# Patient Record
Sex: Male | Born: 1955 | Race: White | Hispanic: No | Marital: Married | Smoking: Former smoker
Health system: Southern US, Community
[De-identification: ages and names within clinical notes are randomized; demographics above are authoritative.]

## PROBLEM LIST (undated history)

## (undated) DIAGNOSIS — G4730 Sleep apnea, unspecified: Secondary | ICD-10-CM

## (undated) DIAGNOSIS — K5792 Diverticulitis of intestine, part unspecified, without perforation or abscess without bleeding: Secondary | ICD-10-CM

## (undated) DIAGNOSIS — E119 Type 2 diabetes mellitus without complications: Secondary | ICD-10-CM

## (undated) DIAGNOSIS — T7840XA Allergy, unspecified, initial encounter: Secondary | ICD-10-CM

## (undated) DIAGNOSIS — J45909 Unspecified asthma, uncomplicated: Secondary | ICD-10-CM

## (undated) DIAGNOSIS — I10 Essential (primary) hypertension: Secondary | ICD-10-CM

## (undated) DIAGNOSIS — D126 Benign neoplasm of colon, unspecified: Secondary | ICD-10-CM

## (undated) DIAGNOSIS — K219 Gastro-esophageal reflux disease without esophagitis: Secondary | ICD-10-CM

## (undated) DIAGNOSIS — F419 Anxiety disorder, unspecified: Secondary | ICD-10-CM

## (undated) DIAGNOSIS — E785 Hyperlipidemia, unspecified: Secondary | ICD-10-CM

## (undated) DIAGNOSIS — I252 Old myocardial infarction: Secondary | ICD-10-CM

## (undated) DIAGNOSIS — I509 Heart failure, unspecified: Secondary | ICD-10-CM

## (undated) DIAGNOSIS — R0600 Dyspnea, unspecified: Secondary | ICD-10-CM

## (undated) HISTORY — DX: Sleep apnea, unspecified: G47.30

## (undated) HISTORY — DX: Gastro-esophageal reflux disease without esophagitis: K21.9

## (undated) HISTORY — DX: Unspecified asthma, uncomplicated: J45.909

## (undated) HISTORY — DX: Old myocardial infarction: I25.2

## (undated) HISTORY — DX: Type 2 diabetes mellitus without complications: E11.9

## (undated) HISTORY — PX: POLYPECTOMY: SHX149

## (undated) HISTORY — DX: Essential (primary) hypertension: I10

## (undated) HISTORY — DX: Allergy, unspecified, initial encounter: T78.40XA

## (undated) HISTORY — DX: Hyperlipidemia, unspecified: E78.5

## (undated) HISTORY — PX: TONSILLECTOMY: SUR1361

## (undated) HISTORY — PX: CORONARY ANGIOPLASTY: SHX604

## (undated) HISTORY — DX: Anxiety disorder, unspecified: F41.9

## (undated) HISTORY — DX: Benign neoplasm of colon, unspecified: D12.6

## (undated) HISTORY — DX: Diverticulitis of intestine, part unspecified, without perforation or abscess without bleeding: K57.92

## (undated) HISTORY — PX: COLONOSCOPY: SHX174

---

## 1998-08-09 DIAGNOSIS — I219 Acute myocardial infarction, unspecified: Secondary | ICD-10-CM

## 1998-08-09 HISTORY — DX: Acute myocardial infarction, unspecified: I21.9

## 1999-04-26 ENCOUNTER — Inpatient Hospital Stay (HOSPITAL_COMMUNITY): Admission: EM | Admit: 1999-04-26 | Discharge: 1999-04-29 | Payer: Self-pay | Admitting: Cardiology

## 1999-04-26 ENCOUNTER — Encounter: Payer: Self-pay | Admitting: Emergency Medicine

## 1999-06-09 ENCOUNTER — Encounter (HOSPITAL_COMMUNITY): Admission: RE | Admit: 1999-06-09 | Discharge: 1999-09-07 | Payer: Self-pay | Admitting: Cardiology

## 1999-06-10 HISTORY — PX: CORONARY ARTERY BYPASS GRAFT: SHX141

## 1999-06-17 ENCOUNTER — Encounter: Payer: Self-pay | Admitting: Thoracic Surgery (Cardiothoracic Vascular Surgery)

## 1999-06-18 ENCOUNTER — Inpatient Hospital Stay (HOSPITAL_COMMUNITY): Admission: AD | Admit: 1999-06-18 | Discharge: 1999-06-23 | Payer: Self-pay | Admitting: Cardiology

## 1999-06-19 ENCOUNTER — Encounter: Payer: Self-pay | Admitting: Surgery

## 1999-06-20 ENCOUNTER — Encounter: Payer: Self-pay | Admitting: Cardiothoracic Surgery

## 1999-06-21 ENCOUNTER — Encounter: Payer: Self-pay | Admitting: Surgery

## 1999-09-08 ENCOUNTER — Encounter (HOSPITAL_COMMUNITY): Admission: RE | Admit: 1999-09-08 | Discharge: 1999-12-07 | Payer: Self-pay | Admitting: Cardiology

## 2003-06-27 ENCOUNTER — Encounter: Payer: Self-pay | Admitting: Gastroenterology

## 2003-06-27 ENCOUNTER — Encounter: Payer: Self-pay | Admitting: Internal Medicine

## 2004-06-25 ENCOUNTER — Ambulatory Visit: Payer: Self-pay | Admitting: Internal Medicine

## 2004-08-14 ENCOUNTER — Ambulatory Visit: Payer: Self-pay | Admitting: Internal Medicine

## 2004-08-21 ENCOUNTER — Ambulatory Visit: Payer: Self-pay | Admitting: Internal Medicine

## 2004-10-19 ENCOUNTER — Ambulatory Visit: Payer: Self-pay | Admitting: Internal Medicine

## 2004-10-26 ENCOUNTER — Ambulatory Visit: Payer: Self-pay | Admitting: Internal Medicine

## 2004-12-21 ENCOUNTER — Ambulatory Visit: Payer: Self-pay | Admitting: Internal Medicine

## 2005-01-07 ENCOUNTER — Ambulatory Visit: Payer: Self-pay | Admitting: Internal Medicine

## 2005-04-09 ENCOUNTER — Ambulatory Visit: Payer: Self-pay | Admitting: Internal Medicine

## 2005-04-19 ENCOUNTER — Ambulatory Visit: Payer: Self-pay | Admitting: Internal Medicine

## 2005-07-12 ENCOUNTER — Ambulatory Visit: Payer: Self-pay | Admitting: Internal Medicine

## 2005-07-20 ENCOUNTER — Ambulatory Visit: Payer: Self-pay | Admitting: Internal Medicine

## 2005-09-13 ENCOUNTER — Ambulatory Visit: Payer: Self-pay | Admitting: Internal Medicine

## 2005-09-20 ENCOUNTER — Ambulatory Visit: Payer: Self-pay | Admitting: Internal Medicine

## 2005-12-15 ENCOUNTER — Ambulatory Visit: Payer: Self-pay | Admitting: Internal Medicine

## 2005-12-20 ENCOUNTER — Ambulatory Visit: Payer: Self-pay | Admitting: Internal Medicine

## 2006-03-04 ENCOUNTER — Ambulatory Visit: Payer: Self-pay | Admitting: Internal Medicine

## 2006-05-01 ENCOUNTER — Emergency Department (HOSPITAL_COMMUNITY): Admission: EM | Admit: 2006-05-01 | Discharge: 2006-05-01 | Payer: Self-pay | Admitting: Emergency Medicine

## 2006-05-10 ENCOUNTER — Ambulatory Visit: Payer: Self-pay | Admitting: Internal Medicine

## 2006-05-13 ENCOUNTER — Ambulatory Visit: Payer: Self-pay | Admitting: Internal Medicine

## 2006-05-17 ENCOUNTER — Ambulatory Visit: Payer: Self-pay | Admitting: Internal Medicine

## 2006-07-29 ENCOUNTER — Ambulatory Visit: Payer: Self-pay | Admitting: Internal Medicine

## 2006-08-24 ENCOUNTER — Ambulatory Visit: Payer: Self-pay | Admitting: Internal Medicine

## 2006-08-24 LAB — CONVERTED CEMR LAB
ALT: 38 units/L (ref 0–40)
AST: 25 units/L (ref 0–37)
Albumin: 3.9 g/dL (ref 3.5–5.2)
Alkaline Phosphatase: 79 units/L (ref 39–117)
BUN: 11 mg/dL (ref 6–23)
Basophils Absolute: 0 10*3/uL (ref 0.0–0.1)
Basophils Relative: 0 % (ref 0.0–1.0)
CO2: 30 meq/L (ref 19–32)
Calcium: 9.6 mg/dL (ref 8.4–10.5)
Chloride: 105 meq/L (ref 96–112)
Cholesterol: 92 mg/dL (ref 0–200)
Creatinine, Ser: 1.2 mg/dL (ref 0.4–1.5)
Eosinophils Relative: 4.6 % (ref 0.0–5.0)
GFR calc Af Amer: 82 mL/min
GFR calc non Af Amer: 68 mL/min
Glucose, Bld: 126 mg/dL — ABNORMAL HIGH (ref 70–99)
HCT: 44.8 % (ref 39.0–52.0)
HDL: 28.6 mg/dL — ABNORMAL LOW (ref >39.0)
Hemoglobin: 15.1 g/dL (ref 13.0–17.0)
LDL Cholesterol: 37 mg/dL (ref 0–99)
Lymphocytes Relative: 15.6 % (ref 12.0–46.0)
MCHC: 33.7 g/dL (ref 30.0–36.0)
MCV: 90.2 fL (ref 78.0–100.0)
Monocytes Absolute: 0.7 10*3/uL (ref 0.2–0.7)
Monocytes Relative: 7.4 % (ref 3.0–11.0)
Neutro Abs: 6.4 10*3/uL (ref 1.4–7.7)
Neutrophils Relative %: 72.4 % (ref 43.0–77.0)
PSA: 1.42 ng/mL (ref 0.10–4.00)
Platelets: 308 10*3/uL (ref 150–400)
Potassium: 3.8 meq/L (ref 3.5–5.1)
RBC: 4.96 M/uL (ref 4.22–5.81)
RDW: 12.6 % (ref 11.5–14.6)
Sodium: 141 meq/L (ref 135–145)
TSH: 0.29 microintl units/mL — ABNORMAL LOW (ref 0.35–5.50)
Total Bilirubin: 1.4 mg/dL — ABNORMAL HIGH (ref 0.3–1.2)
Total CHOL/HDL Ratio: 3.2
Total Protein: 7.4 g/dL (ref 6.0–8.3)
Triglycerides: 130 mg/dL (ref 0–149)
VLDL: 26 mg/dL (ref 0–40)
WBC: 8.9 10*3/uL (ref 4.5–10.5)

## 2006-08-25 ENCOUNTER — Encounter: Payer: Self-pay | Admitting: Internal Medicine

## 2006-09-01 ENCOUNTER — Ambulatory Visit: Payer: Self-pay | Admitting: Internal Medicine

## 2006-11-25 ENCOUNTER — Ambulatory Visit: Payer: Self-pay | Admitting: Internal Medicine

## 2006-11-25 LAB — CONVERTED CEMR LAB
Albumin: 3.8 g/dL (ref 3.5–5.2)
Alkaline Phosphatase: 78 units/L (ref 39–117)
CRP, High Sensitivity: 4 (ref 0.00–5.00)
Cholesterol: 100 mg/dL (ref 0–200)
LDL Cholesterol: 42 mg/dL (ref 0–99)
Total CHOL/HDL Ratio: 4.1
Total Protein: 7 g/dL (ref 6.0–8.3)
Triglycerides: 168 mg/dL — ABNORMAL HIGH (ref 0–149)

## 2006-12-01 ENCOUNTER — Ambulatory Visit: Payer: Self-pay | Admitting: Internal Medicine

## 2007-01-25 ENCOUNTER — Ambulatory Visit: Payer: Self-pay | Admitting: Internal Medicine

## 2007-01-25 LAB — CONVERTED CEMR LAB
ALT: 22 units/L (ref 0–40)
Bilirubin, Direct: 0.2 mg/dL (ref 0.0–0.3)
Cholesterol: 99 mg/dL (ref 0–200)
HDL: 22.5 mg/dL — ABNORMAL LOW (ref >39.0)
LDL Cholesterol: 52 mg/dL (ref 0–99)
Total Bilirubin: 0.8 mg/dL (ref 0.3–1.2)
Total Protein: 6.6 g/dL (ref 6.0–8.3)

## 2007-02-03 ENCOUNTER — Ambulatory Visit: Payer: Self-pay | Admitting: Internal Medicine

## 2007-02-13 ENCOUNTER — Telehealth: Payer: Self-pay | Admitting: Internal Medicine

## 2007-04-06 DIAGNOSIS — I10 Essential (primary) hypertension: Secondary | ICD-10-CM | POA: Insufficient documentation

## 2007-04-06 DIAGNOSIS — I2510 Atherosclerotic heart disease of native coronary artery without angina pectoris: Secondary | ICD-10-CM | POA: Insufficient documentation

## 2007-04-06 DIAGNOSIS — F411 Generalized anxiety disorder: Secondary | ICD-10-CM | POA: Insufficient documentation

## 2007-04-06 DIAGNOSIS — E785 Hyperlipidemia, unspecified: Secondary | ICD-10-CM | POA: Insufficient documentation

## 2007-05-10 ENCOUNTER — Ambulatory Visit: Payer: Self-pay | Admitting: Internal Medicine

## 2007-05-10 LAB — CONVERTED CEMR LAB
ALT: 27 units/L (ref 0–53)
AST: 21 units/L (ref 0–37)
Albumin: 3.7 g/dL (ref 3.5–5.2)
Alkaline Phosphatase: 75 units/L (ref 39–117)
Total Bilirubin: 1.1 mg/dL (ref 0.3–1.2)
Triglycerides: 153 mg/dL — ABNORMAL HIGH (ref 0–149)
VLDL: 31 mg/dL (ref 0–40)

## 2007-05-17 ENCOUNTER — Ambulatory Visit: Payer: Self-pay | Admitting: Internal Medicine

## 2007-05-17 DIAGNOSIS — J309 Allergic rhinitis, unspecified: Secondary | ICD-10-CM | POA: Insufficient documentation

## 2007-05-17 DIAGNOSIS — N529 Male erectile dysfunction, unspecified: Secondary | ICD-10-CM | POA: Insufficient documentation

## 2007-05-17 LAB — CONVERTED CEMR LAB: Cholesterol, target level: 200 mg/dL

## 2007-06-05 ENCOUNTER — Telehealth (INDEPENDENT_AMBULATORY_CARE_PROVIDER_SITE_OTHER): Payer: Self-pay | Admitting: *Deleted

## 2007-06-05 DIAGNOSIS — M25579 Pain in unspecified ankle and joints of unspecified foot: Secondary | ICD-10-CM | POA: Insufficient documentation

## 2007-06-07 ENCOUNTER — Telehealth (INDEPENDENT_AMBULATORY_CARE_PROVIDER_SITE_OTHER): Payer: Self-pay | Admitting: *Deleted

## 2007-08-23 ENCOUNTER — Ambulatory Visit: Payer: Self-pay | Admitting: Internal Medicine

## 2007-08-31 ENCOUNTER — Ambulatory Visit: Payer: Self-pay | Admitting: Internal Medicine

## 2007-08-31 LAB — CONVERTED CEMR LAB
AST: 24 units/L (ref 0–37)
Albumin: 4.1 g/dL (ref 3.5–5.2)
Alkaline Phosphatase: 71 units/L (ref 39–117)
CO2: 24 meq/L (ref 19–32)
Chloride: 101 meq/L (ref 96–112)
Cholesterol: 113 mg/dL (ref 0–200)
Creatinine, Ser: 1 mg/dL (ref 0.4–1.5)
HCT: 46 % (ref 39.0–52.0)
HDL: 18.9 mg/dL — ABNORMAL LOW (ref >39.0)
LDL Cholesterol: 67 mg/dL (ref 0–99)
MCHC: 34.3 g/dL (ref 30.0–36.0)
MCV: 91.9 fL (ref 78.0–100.0)
Monocytes Relative: 5.1 % (ref 3.0–11.0)
Neutrophils Relative %: 76.6 % (ref 43.0–77.0)
Potassium: 4.2 meq/L (ref 3.5–5.1)
RBC: 5 M/uL (ref 4.22–5.81)
RDW: 12.7 % (ref 11.5–14.6)
Sodium: 138 meq/L (ref 135–145)
Total Bilirubin: 0.9 mg/dL (ref 0.3–1.2)
Total CHOL/HDL Ratio: 6
Total Protein: 7.2 g/dL (ref 6.0–8.3)
Triglycerides: 134 mg/dL (ref 0–149)
Urobilinogen, UA: 0.2
pH: 5.5

## 2007-09-07 ENCOUNTER — Ambulatory Visit: Payer: Self-pay | Admitting: Internal Medicine

## 2007-11-28 ENCOUNTER — Ambulatory Visit: Payer: Self-pay | Admitting: Internal Medicine

## 2008-01-25 ENCOUNTER — Ambulatory Visit: Payer: Self-pay | Admitting: Internal Medicine

## 2008-02-20 ENCOUNTER — Ambulatory Visit: Payer: Self-pay | Admitting: Internal Medicine

## 2008-02-20 DIAGNOSIS — T887XXA Unspecified adverse effect of drug or medicament, initial encounter: Secondary | ICD-10-CM | POA: Insufficient documentation

## 2008-02-20 LAB — CONVERTED CEMR LAB
Bilirubin, Direct: 0.1 mg/dL (ref 0.0–0.3)
HDL: 23.3 mg/dL — ABNORMAL LOW (ref >39.0)
Total Bilirubin: 0.9 mg/dL (ref 0.3–1.2)
Total CHOL/HDL Ratio: 4.9
VLDL: 31 mg/dL (ref 0–40)

## 2008-03-07 ENCOUNTER — Ambulatory Visit: Payer: Self-pay | Admitting: Internal Medicine

## 2008-03-18 ENCOUNTER — Ambulatory Visit: Payer: Self-pay | Admitting: Internal Medicine

## 2008-05-10 ENCOUNTER — Ambulatory Visit: Payer: Self-pay | Admitting: Internal Medicine

## 2008-06-12 ENCOUNTER — Telehealth: Payer: Self-pay | Admitting: *Deleted

## 2008-06-18 ENCOUNTER — Telehealth: Payer: Self-pay | Admitting: Internal Medicine

## 2008-06-19 ENCOUNTER — Ambulatory Visit: Payer: Self-pay | Admitting: Internal Medicine

## 2008-06-19 DIAGNOSIS — J4540 Moderate persistent asthma, uncomplicated: Secondary | ICD-10-CM | POA: Insufficient documentation

## 2008-08-27 ENCOUNTER — Ambulatory Visit: Payer: Self-pay | Admitting: Gastroenterology

## 2008-08-30 ENCOUNTER — Ambulatory Visit: Payer: Self-pay | Admitting: Internal Medicine

## 2008-08-30 LAB — CONVERTED CEMR LAB
AST: 21 units/L (ref 0–37)
Basophils Absolute: 0 10*3/uL (ref 0.0–0.1)
Basophils Relative: 0.1 % (ref 0.0–3.0)
Bilirubin Urine: NEGATIVE
Bilirubin, Direct: 0.1 mg/dL (ref 0.0–0.3)
Blood in Urine, dipstick: NEGATIVE
CRP, High Sensitivity: 4 (ref 0.00–5.00)
Chloride: 104 meq/L (ref 96–112)
Cholesterol: 109 mg/dL (ref 0–200)
Creatinine, Ser: 1 mg/dL (ref 0.4–1.5)
Eosinophils Absolute: 0.3 10*3/uL (ref 0.0–0.7)
GFR calc non Af Amer: 83 mL/min
Glucose, Urine, Semiquant: NEGATIVE
HDL: 28.9 mg/dL — ABNORMAL LOW (ref >39.0)
LDL Cholesterol: 61 mg/dL (ref 0–99)
MCHC: 35.2 g/dL (ref 30.0–36.0)
MCV: 91.7 fL (ref 78.0–100.0)
Neutrophils Relative %: 71.5 % (ref 43.0–77.0)
PSA: 1.07 ng/mL (ref 0.10–4.00)
Platelets: 221 10*3/uL (ref 150–400)
Protein, U semiquant: NEGATIVE
RBC: 4.54 M/uL (ref 4.22–5.81)
RDW: 12.9 % (ref 11.5–14.6)
Sodium: 141 meq/L (ref 135–145)
Total Bilirubin: 1.1 mg/dL (ref 0.3–1.2)
Triglycerides: 96 mg/dL (ref 0–149)
Urobilinogen, UA: 0.2
VLDL: 19 mg/dL (ref 0–40)
pH: 5.5

## 2008-09-09 DIAGNOSIS — D126 Benign neoplasm of colon, unspecified: Secondary | ICD-10-CM

## 2008-09-09 HISTORY — DX: Benign neoplasm of colon, unspecified: D12.6

## 2008-09-12 ENCOUNTER — Ambulatory Visit: Payer: Self-pay | Admitting: Internal Medicine

## 2008-09-13 ENCOUNTER — Ambulatory Visit: Payer: Self-pay | Admitting: Gastroenterology

## 2008-09-13 ENCOUNTER — Encounter: Payer: Self-pay | Admitting: Gastroenterology

## 2008-09-16 ENCOUNTER — Encounter: Payer: Self-pay | Admitting: Gastroenterology

## 2008-11-26 ENCOUNTER — Ambulatory Visit: Payer: Self-pay | Admitting: Internal Medicine

## 2008-11-27 ENCOUNTER — Telehealth: Payer: Self-pay | Admitting: Internal Medicine

## 2008-11-29 ENCOUNTER — Telehealth: Payer: Self-pay | Admitting: Internal Medicine

## 2008-12-04 ENCOUNTER — Ambulatory Visit: Payer: Self-pay | Admitting: Internal Medicine

## 2008-12-04 LAB — CONVERTED CEMR LAB
AST: 24 units/L (ref 0–37)
Albumin: 3.7 g/dL (ref 3.5–5.2)
Alkaline Phosphatase: 68 units/L (ref 39–117)
Cholesterol: 115 mg/dL (ref 0–200)
Total Protein: 7.3 g/dL (ref 6.0–8.3)
Triglycerides: 120 mg/dL (ref 0.0–149.0)

## 2008-12-11 ENCOUNTER — Ambulatory Visit: Payer: Self-pay | Admitting: Internal Medicine

## 2009-04-21 ENCOUNTER — Ambulatory Visit: Payer: Self-pay | Admitting: Internal Medicine

## 2009-04-21 LAB — CONVERTED CEMR LAB
Bilirubin, Direct: 0.1 mg/dL (ref 0.0–0.3)
CO2: 28 meq/L (ref 19–32)
Calcium: 9.1 mg/dL (ref 8.4–10.5)
Creatinine, Ser: 1 mg/dL (ref 0.4–1.5)
HDL: 28.8 mg/dL — ABNORMAL LOW (ref >39.00)
Total Bilirubin: 0.8 mg/dL (ref 0.3–1.2)
Total CHOL/HDL Ratio: 4
Total Protein: 6.9 g/dL (ref 6.0–8.3)
Triglycerides: 129 mg/dL (ref 0.0–149.0)

## 2009-04-28 ENCOUNTER — Ambulatory Visit: Payer: Self-pay | Admitting: Internal Medicine

## 2009-05-08 ENCOUNTER — Telehealth: Payer: Self-pay | Admitting: Internal Medicine

## 2009-05-08 DIAGNOSIS — K5732 Diverticulitis of large intestine without perforation or abscess without bleeding: Secondary | ICD-10-CM | POA: Insufficient documentation

## 2009-05-09 ENCOUNTER — Ambulatory Visit: Payer: Self-pay | Admitting: Internal Medicine

## 2009-06-06 ENCOUNTER — Ambulatory Visit: Payer: Self-pay | Admitting: Internal Medicine

## 2009-09-08 ENCOUNTER — Ambulatory Visit: Payer: Self-pay | Admitting: Internal Medicine

## 2009-09-08 LAB — CONVERTED CEMR LAB
AST: 29 units/L (ref 0–37)
Albumin: 4 g/dL (ref 3.5–5.2)
Alkaline Phosphatase: 57 units/L (ref 39–117)
Basophils Relative: 1 % (ref 0.0–3.0)
Bilirubin, Direct: 0.2 mg/dL (ref 0.0–0.3)
Blood in Urine, dipstick: NEGATIVE
CO2: 30 meq/L (ref 19–32)
Calcium: 9.6 mg/dL (ref 8.4–10.5)
GFR calc non Af Amer: 74.08 mL/min (ref >60)
HDL: 31.9 mg/dL — ABNORMAL LOW (ref >39.00)
Hemoglobin: 15.7 g/dL (ref 13.0–17.0)
LDL Cholesterol: 52 mg/dL (ref 0–99)
Lymphocytes Relative: 19.6 % (ref 12.0–46.0)
Monocytes Relative: 8.6 % (ref 3.0–12.0)
Neutro Abs: 4.5 10*3/uL (ref 1.4–7.7)
Neutrophils Relative %: 67.6 % (ref 43.0–77.0)
Nitrite: NEGATIVE
RBC: 5.06 M/uL (ref 4.22–5.81)
Sodium: 142 meq/L (ref 135–145)
Specific Gravity, Urine: 1.02
Total CHOL/HDL Ratio: 3
Total Protein: 7.4 g/dL (ref 6.0–8.3)
Urobilinogen, UA: 0.2
VLDL: 14.8 mg/dL (ref 0.0–40.0)
WBC Urine, dipstick: NEGATIVE
WBC: 6.7 10*3/uL (ref 4.5–10.5)

## 2009-09-15 ENCOUNTER — Ambulatory Visit: Payer: Self-pay | Admitting: Internal Medicine

## 2009-09-15 DIAGNOSIS — R972 Elevated prostate specific antigen [PSA]: Secondary | ICD-10-CM | POA: Insufficient documentation

## 2009-11-13 ENCOUNTER — Ambulatory Visit: Payer: Self-pay | Admitting: Internal Medicine

## 2009-11-14 ENCOUNTER — Telehealth: Payer: Self-pay | Admitting: Internal Medicine

## 2009-11-17 ENCOUNTER — Ambulatory Visit: Payer: Self-pay | Admitting: Internal Medicine

## 2010-01-20 ENCOUNTER — Ambulatory Visit: Payer: Self-pay | Admitting: Internal Medicine

## 2010-01-20 LAB — CONVERTED CEMR LAB
AST: 21 units/L (ref 0–37)
Albumin: 3.9 g/dL (ref 3.5–5.2)
BUN: 12 mg/dL (ref 6–23)
Calcium: 9.6 mg/dL (ref 8.4–10.5)
Cholesterol: 126 mg/dL (ref 0–200)
GFR calc non Af Amer: 79.81 mL/min (ref >60)
Glucose, Bld: 161 mg/dL — ABNORMAL HIGH (ref 70–99)
HDL: 32 mg/dL — ABNORMAL LOW (ref >39.00)
LDL Cholesterol: 71 mg/dL (ref 0–99)
PSA, Free Pct: 40 (ref >25)
PSA: 1.26 ng/mL (ref 0.10–4.00)
Potassium: 4.1 meq/L (ref 3.5–5.1)
Sodium: 143 meq/L (ref 135–145)
Total Bilirubin: 0.8 mg/dL (ref 0.3–1.2)
VLDL: 23.2 mg/dL (ref 0.0–40.0)

## 2010-01-22 ENCOUNTER — Telehealth: Payer: Self-pay | Admitting: Internal Medicine

## 2010-01-27 ENCOUNTER — Ambulatory Visit: Payer: Self-pay | Admitting: Internal Medicine

## 2010-03-24 ENCOUNTER — Ambulatory Visit: Payer: Self-pay | Admitting: Internal Medicine

## 2010-03-24 LAB — CONVERTED CEMR LAB: Hgb A1c MFr Bld: 8 % — ABNORMAL HIGH (ref 4.6–6.5)

## 2010-03-26 ENCOUNTER — Telehealth: Payer: Self-pay | Admitting: Internal Medicine

## 2010-03-31 ENCOUNTER — Ambulatory Visit: Payer: Self-pay | Admitting: Internal Medicine

## 2010-03-31 DIAGNOSIS — E119 Type 2 diabetes mellitus without complications: Secondary | ICD-10-CM

## 2010-03-31 DIAGNOSIS — E1169 Type 2 diabetes mellitus with other specified complication: Secondary | ICD-10-CM | POA: Insufficient documentation

## 2010-04-08 ENCOUNTER — Telehealth: Payer: Self-pay | Admitting: Internal Medicine

## 2010-06-03 ENCOUNTER — Ambulatory Visit: Payer: Self-pay | Admitting: Internal Medicine

## 2010-06-03 LAB — CONVERTED CEMR LAB
ALT: 26 units/L (ref 0–53)
AST: 20 units/L (ref 0–37)
Alkaline Phosphatase: 61 units/L (ref 39–117)
Bilirubin, Direct: 0.2 mg/dL (ref 0.0–0.3)
Cholesterol: 109 mg/dL (ref 0–200)
Total Bilirubin: 1 mg/dL (ref 0.3–1.2)
Total Protein: 6.9 g/dL (ref 6.0–8.3)
VLDL: 20.4 mg/dL (ref 0.0–40.0)

## 2010-06-10 ENCOUNTER — Ambulatory Visit: Payer: Self-pay | Admitting: Internal Medicine

## 2010-06-10 LAB — CONVERTED CEMR LAB: Cholesterol, target level: 200 mg/dL

## 2010-09-08 ENCOUNTER — Ambulatory Visit
Admission: RE | Admit: 2010-09-08 | Discharge: 2010-09-08 | Payer: Self-pay | Source: Home / Self Care | Attending: Internal Medicine | Admitting: Internal Medicine

## 2010-09-08 NOTE — Assessment & Plan Note (Signed)
Summary: CPX//SLM   Vital Signs:  Patient profile:   55 year old male Height:      72 inches Weight:      227 pounds BMI:     30.90 Temp:     98.2 degrees F oral Pulse rate:   70 / minute Resp:     14 per minute BP sitting:   136 / 84  (left arm)  Vitals Entered By: Willy Eddy, LPN (September 15, 2009 8:54 AM)  Nutrition Counseling: Patient's BMI is greater than 25 and therefore counseled on weight management options. CC: cpx   CC:  cpx.  History of Present Illness: The pt was asked about all immunizations, health maint. services that are appropriate to their age and was given guidance on diet exercize  and weight management   bump in PAS notedon CPX labs  Preventive Screening-Counseling & Management  Alcohol-Tobacco     Smoking Status: never     Year Quit:  ~1980  Problems Prior to Update: 1)  Diverticulitis, Acute  (ICD-562.11) 2)  Dysmetabolic Syndrome X  (ICD-277.7) 3)  Asthma Unspecified With Exacerbation  (ICD-493.92) 4)  Cough  (ICD-786.2) 5)  Uns Advrs Eff Uns Rx Medicinal&biological Sbstnc  (ICD-995.20) 6)  Acute Maxillary Sinusitis  (ICD-461.0) 7)  Physical Examination  (ICD-V70.0) 8)  Ankle Pain, Chronic  (ICD-719.47) 9)  Allergic Rhinitis  (ICD-477.9) 10)  Erectile Dysfunction, 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) 11)  Myocardial Infarction, Hx of  (ICD-412) 12)  Hypertension  (ICD-401.9) 13)  Hyperlipidemia  (ICD-272.4) 14)  Anxiety  (ICD-300.00)  Current Problems (verified): 1)  Diverticulitis, Acute  (ICD-562.11) 2)  Dysmetabolic Syndrome X  (ICD-277.7) 3)  Asthma Unspecified With Exacerbation  (ICD-493.92) 4)  Cough  (ICD-786.2) 5)  Uns Advrs Eff Uns Rx Medicinal&biological Sbstnc  (ICD-995.20) 6)  Acute Maxillary Sinusitis  (ICD-461.0) 7)  Physical Examination  (ICD-V70.0) 8)  Ankle Pain, Chronic  (ICD-719.47) 9)  Allergic Rhinitis  (ICD-477.9) 10)  Erectile Dysfunction, 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 Once Daily 2)  Neurpath-B 3-35-2 Mg Tabs (L-Methylfolate-B6-B12) .Marland Kitchen.. 1 Once Daily 3)  Aspirin 325 Mg  Tbec (Aspirin) .... Once Daily 4)  Multivitamins   Caps (Multiple Vitamin) .... Once Daily 5)  Flax Seed Oil 1000 Mg  Caps (Flaxseed (Linseed)) .... Once Daily 6)  Losartan Potassium 100 Mg Tabs (Losartan Potassium) .Marland Kitchen.. 1 Once Daily 7)  Elidel 1 %  Crea (Pimecrolimus) .... As Needed 8)  Niacin Flush Free 500 Mg  Caps (Inositol Niacinate) .... Once Daily 9)  Vitamin B-6 500 Mg  Tabs (Pyridoxine Hcl) .... Once Daily 10)  Folic Acid 800 Mcg  Tabs (Folic Acid) .... 2 Once Daily 11)  Crestor 20 Mg  Tabs (Rosuvastatin Calcium) .... Once Daily 12)  Sonata 10 Mg  Caps (Zaleplon) .... As Needed 13)  Famvir 500 Mg  Tabs (Famciclovir) .... As Needed 14)  Alprazolam 0.5 Mg  Tabs (Alprazolam) .... As Needed 15)  Lovaza 1 Gm  Caps (Omega-3-Acid Ethyl Esters) .... One By Mouth Tid 16)  Cialis 20 Mg Tabs (Tadalafil) .... Take As Directed 17)  Astelin 137 Mcg/spray  Soln (Azelastine Hcl) .... Two Spray Q Nare Bid 18)  Nitrolingual 0.4 Mg/spray Tl Soln (Nitroglycerin) .... Use As Directed 19)  Proair Hfa 108 (  90 Base) Mcg/act Aers (Albuterol Sulfate) .... Two Puff By Mouth Every 4 Hours Prn 20)  Symbicort 160-4.5 Mcg/act Aero (Budesonide-Formoterol Fumarate) .... Two Puff By Mouth Bid 21)  Flagyl 250 Mg Tabs (Metronidazole) .Marland Kitchen.. 1 Four Times A Day For 10 Days 22)  Cipro 500 Mg Tabs (Ciprofloxacin Hcl) .Marland Kitchen.. 1 Two Times A Day For 10 Days  Current Medications (verified): 1)  Bisoprolol-Hydrochlorothiazide 5-6.25 Mg Tabs (Bisoprolol-Hydrochlorothiazide) .... Once Daily 2)  Neurpath-B 3-35-2 Mg Tabs (L-Methylfolate-B6-B12) .Marland Kitchen.. 1 Once Daily 3)  Aspirin 325 Mg  Tbec  (Aspirin) .Marland Kitchen.. 1 Two Times A Day 4)  Multivitamins   Caps (Multiple Vitamin) .... Once Daily 5)  Losartan Potassium 100 Mg Tabs (Losartan Potassium) .Marland Kitchen.. 1 Once Daily 6)  Elidel 1 %  Crea (Pimecrolimus) .... As Needed 7)  Niacin Flush Free 500 Mg  Caps (Inositol Niacinate) .... Once Daily 8)  Vitamin B-6 500 Mg  Tabs (Pyridoxine Hcl) .... Once Daily 9)  Folic Acid 800 Mcg  Tabs (Folic Acid) .... 2 Once Daily 10)  Crestor 20 Mg  Tabs (Rosuvastatin Calcium) .... Once Daily 11)  Sonata 10 Mg  Caps (Zaleplon) .... As Needed 12)  Famvir 500 Mg  Tabs (Famciclovir) .... As Needed 13)  Alprazolam 0.5 Mg  Tabs (Alprazolam) .... As Needed 14)  Lovaza 1 Gm  Caps (Omega-3-Acid Ethyl Esters) .Marland Kitchen.. 1 Two Times A Day 15)  Cialis 20 Mg Tabs (Tadalafil) .... Take As Directed 16)  Nitrolingual 0.4 Mg/spray Tl Soln (Nitroglycerin) .... Use As Directed 17)  Symbicort 160-4.5 Mcg/act Aero (Budesonide-Formoterol Fumarate) .... Two Puff By Mouth Bid  Allergies (verified): 1)  ! 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  Past Medical History: Reviewed history from 05/17/2007 and no changes required. Anxiety Hyperlipidemia Hypertension Myocardial infarction, hx of Allergic rhinitis  Past Surgical History: Reviewed history from 04/06/2007 and no changes required. 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  Social History: Reviewed history from 05/17/2007 and no changes required. 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   Eyes:  vision grossly intact, pupils equal, and pupils round.   Ears:  R ear normal and L ear normal.   Nose:  nasal dischargemucosal pallor, mucosal edema, and airflow obstruction.   Mouth:  pharyngeal erythema, posterior lymphoid hypertrophy, postnasal drip, and pharyngeal crowding.   Neck:  No deformities, masses, or tenderness noted. Lungs:  normal respiratory effort, R base crackles, and R wheezes.   Heart:  normal rate and regular rhythm.   Abdomen:  Bowel sounds positive,abdomen soft and non-tender without masses, organomegaly or hernias noted. Rectal:  normal sphincter tone and external hemorrhoid(s).   Genitalia:  circumcised and no urethral discharge.   Prostate:  no gland enlargement, no nodules, and 1+ enlarged.   Msk:  normal ROM and no joint tenderness.   Pulses:  R and L carotid,radial,femoral,dorsalis pedis and posterior tibial pulses are full and equal bilaterally Extremities:  No clubbing, cyanosis, edema, or deformity noted with normal full range of motion of all joints.   Neurologic:  alert & oriented X3 and gait normal.   Skin:  turgor normal and  color normal.   Cervical Nodes:  No lymphadenopathy noted Axillary Nodes:  No palpable lymphadenopathy Inguinal Nodes:  No significant adenopathy Psych:  Cognition and judgment appear intact. Alert and cooperative with normal attention span and concentration. No apparent delusions, illusions, hallucinations   Impression & Recommendations:  Problem # 1:  PHYSICAL EXAMINATION (ICD-V70.0)  Colonoscopy: Location:  [facility].   (09/13/2008) Td Booster: given (08/09/2002)   Flu Vax: Fluvax 3+ (06/06/2009)   Chol: 99 (09/08/2009)   HDL: 31.90 (09/08/2009)   LDL: 52 (09/08/2009)   TG: 74.0 (09/08/2009) TSH: 0.33 (09/08/2009)   HgbA1C: See Comment % (08/25/2006)   PSA: 2.18 (09/08/2009) Next Colonoscopy due:: 09/2013 (09/13/2008)  Discussed using sunscreen, use of alcohol,  drug use, self testicular exam, routine dental care, routine eye care, routine physical exam, seat belts, multiple vitamins, osteoporosis prevention, adequate calcium intake in diet, and recommendations for immunizations.  Discussed exercise and checking cholesterol.  Discussed gun safety, safe sex, and contraception. Also recommend checking PSA.  Problem # 2:  PSA, INCREASED (ICD-790.93) boddy prostate will treat with cirpo for 21 days and repeat free and total  Complete Medication List: 1)  Bisoprolol-hydrochlorothiazide 5-6.25 Mg Tabs (Bisoprolol-hydrochlorothiazide) .... Once daily 2)  Neurpath-b 3-35-2 Mg Tabs (L-methylfolate-b6-b12) .Marland Kitchen.. 1 once daily 3)  Aspirin 325 Mg Tbec (Aspirin) .Marland Kitchen.. 1 two times a day 4)  Multivitamins Caps (Multiple vitamin) .... Once daily 5)  Losartan Potassium 100 Mg Tabs (Losartan potassium) .Marland Kitchen.. 1 once daily 6)  Elidel 1 % Crea (Pimecrolimus) .... As needed 7)  Niacin Flush Free 500 Mg Caps (Inositol niacinate) .... Once daily 8)  Vitamin B-6 500 Mg Tabs (Pyridoxine hcl) .... Once daily 9)  Folic Acid 800 Mcg Tabs (Folic acid) .... 2 once daily 10)  Crestor 20 Mg Tabs (Rosuvastatin calcium) .... Once daily 11)  Sonata 10 Mg Caps (Zaleplon) .... As needed 12)  Famvir 500 Mg Tabs (Famciclovir) .... As needed 13)  Alprazolam 0.5 Mg Tabs (Alprazolam) .... As needed 14)  Lovaza 1 Gm Caps (Omega-3-acid ethyl esters) .Marland Kitchen.. 1 two times a day 15)  Cialis 20 Mg Tabs (Tadalafil) .... Take as directed 16)  Nitrolingual 0.4 Mg/spray Tl Soln (Nitroglycerin) .... Use as directed 17)  Symbicort 160-4.5 Mcg/act Aero (Budesonide-formoterol fumarate) .... Two puff by mouth bid 18)  Ciprofloxacin Hcl 500 Mg Tabs (Ciprofloxacin hcl) .... One by mouth two times a day for 21 days  Other Orders: EKG w/ Interpretation (93000)  Patient Instructions: 1)  pas free and total 790.93 2)  Please schedule a follow-up appointment in 2 months. Prescriptions: CIPROFLOXACIN HCL 500 MG  TABS (CIPROFLOXACIN HCL) one by mouth two times a day for 21 days  #42 x 0   Entered and Authorized by:   Stacie Glaze MD   Signed by:   Stacie Glaze MD on 09/15/2009   Method used:   Electronically to        [id number] (retail)       [address]       [place], [place]  [address]       Ph: [phone] or [phone]       Fax: [phone]   RxID:   [id number]

## 2010-09-08 NOTE — Progress Notes (Signed)
Summary: PSA results  Phone Note Call from Patient   Caller: Patient Call For: Stacie Glaze MD Summary of Call: Pt given PSA results. Initial call taken by: Lynann [name] CMA,  January 22, 2010 4:45 PM

## 2010-09-08 NOTE — Assessment & Plan Note (Signed)
Summary: 2 month rov/njr   Vital Signs:  Patient profile:   55 year old male Height:      72 inches Weight:      232 pounds BMI:     31.58 Temp:     98.2 degrees F oral Pulse rate:   72 / minute Resp:     14 per minute BP sitting:   140 / 80  (left arm)  Vitals Entered By: Willy Eddy, LPN (November 17, 2009 10:50 AM) CC: roas labs, Hypertension Management   CC:  roas labs and Hypertension Management.  History of Present Illness: The pt has been followed for increase in PSA the increased noted at his CPX was not shown to increase there is family hs of prostate issues ( BPH) he is also follwed for HTN and lipids and has a hx of CAD  Hypertension History:      He denies headache, chest pain, palpitations, dyspnea with exertion, orthopnea, PND, peripheral edema, visual symptoms, neurologic problems, syncope, and side effects from treatment.        Positive major cardiovascular risk factors include male age 41 years old or older, hyperlipidemia, and hypertension.  Negative major cardiovascular risk factors include no history of diabetes and non-tobacco-user status.        Positive history for target organ damage include ASHD (either angina/prior MI/prior CABG).  Further assessment for target organ damage reveals no history of stroke/TIA or peripheral vascular disease.     Preventive Screening-Counseling & Management  Alcohol-Tobacco     Smoking Status: never     Year Quit:  ~1980  Problems Prior to Update: 1)  Psa, Increased  (ICD-790.93) 2)  Diverticulitis, Acute  (ICD-562.11) 3)  Dysmetabolic Syndrome X  (ICD-277.7) 4)  Asthma Unspecified With Exacerbation  (ICD-493.92) 5)  Cough  (ICD-786.2) 6)  Uns Advrs Eff Uns Rx Medicinal&biological Sbstnc  (ICD-995.20) 7)  Acute Maxillary Sinusitis  (ICD-461.0) 8)  Physical Examination  (ICD-V70.0) 9)  Ankle Pain, Chronic  (ICD-719.47) 10)  Allergic Rhinitis  (ICD-477.9) 11)  Erectile Dysfunction, Secondary To Medication   (ICD-607.84) 12)  Myocardial Infarction, Hx of  (ICD-412) 13)  Hypertension  (ICD-401.9) 14)  Hyperlipidemia  (ICD-272.4) 15)  Anxiety  (ICD-300.00)  Current Problems (verified): 1)  Psa, Increased  (ICD-790.93) 2)  Diverticulitis, Acute  (ICD-562.11) 3)  Dysmetabolic Syndrome X  (ICD-277.7) 4)  Asthma Unspecified With Exacerbation  (ICD-493.92) 5)  Cough  (ICD-786.2) 6)  Uns Advrs Eff Uns Rx Medicinal&biological Sbstnc  (ICD-995.20) 7)  Acute Maxillary Sinusitis  (ICD-461.0) 8)  Physical Examination  (ICD-V70.0) 9)  Ankle Pain, Chronic  (ICD-719.47) 10)  Allergic Rhinitis  (ICD-477.9) 11)  Erectile Dysfunction, 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 Once Daily 2)  Neurpath-B 3-35-2 Mg Tabs (L-Methylfolate-B6-B12) .Marland Kitchen.. 1 Once Daily 3)  Aspirin 325 Mg  Tbec (Aspirin) .Marland Kitchen.. 1 Two Times A Day 4)  Multivitamins   Caps (Multiple Vitamin) .... Once Daily 5)  Losartan Potassium 100 Mg Tabs (Losartan Potassium) .Marland Kitchen.. 1 Once Daily 6)  Elidel 1 %  Crea (Pimecrolimus) .... As Needed 7)  Niacin Flush Free 500 Mg  Caps (Inositol Niacinate) .... Once Daily 8)  Vitamin B-6 500 Mg  Tabs (Pyridoxine Hcl) .... Once Daily 9)  Folic Acid 800 Mcg  Tabs (Folic Acid) .Marland KitchenMarland KitchenMarland Kitchen  2 Once Daily 10)  Crestor 20 Mg  Tabs (Rosuvastatin Calcium) .... Once Daily 11)  Sonata 10 Mg  Caps (Zaleplon) .... As Needed 12)  Famvir 500 Mg  Tabs (Famciclovir) .... As Needed 13)  Alprazolam 0.5 Mg  Tabs (Alprazolam) .... As Needed 14)  Lovaza 1 Gm  Caps (Omega-3-Acid Ethyl Esters) .Marland Kitchen.. 1 Two Times A Day 15)  Cialis 20 Mg Tabs (Tadalafil) .... Take As Directed 16)  Nitrolingual 0.4 Mg/spray Tl Soln (Nitroglycerin) .... Use As Directed 17)  Symbicort 160-4.5 Mcg/act Aero  (Budesonide-Formoterol Fumarate) .... Two Puff By Mouth Bid  Current Medications (verified): 1)  Bisoprolol-Hydrochlorothiazide 5-6.25 Mg Tabs (Bisoprolol-Hydrochlorothiazide) .... Once Daily 2)  Neurpath-B 3-35-2 Mg Tabs (L-Methylfolate-B6-B12) .Marland Kitchen.. 1 Once Daily 3)  Aspirin 325 Mg  Tbec (Aspirin) .Marland Kitchen.. 1 Two Times A Day 4)  Multivitamins   Caps (Multiple Vitamin) .... Once Daily 5)  Losartan Potassium 100 Mg Tabs (Losartan Potassium) .Marland Kitchen.. 1 Once Daily 6)  Elidel 1 %  Crea (Pimecrolimus) .... As Needed 7)  Niacin Flush Free 500 Mg  Caps (Inositol Niacinate) .... Once Daily 8)  Vitamin B-6 500 Mg  Tabs (Pyridoxine Hcl) .... Once Daily 9)  Folic Acid 800 Mcg  Tabs (Folic Acid) .... 2 Once Daily 10)  Crestor 20 Mg  Tabs (Rosuvastatin Calcium) .... Once Daily 11)  Sonata 10 Mg  Caps (Zaleplon) .... As Needed 12)  Famvir 500 Mg  Tabs (Famciclovir) .... As Needed 13)  Alprazolam 0.5 Mg  Tabs (Alprazolam) .... As Needed 14)  Lovaza 1 Gm  Caps (Omega-3-Acid Ethyl Esters) .Marland Kitchen.. 1 Two Times A Day 15)  Cialis 20 Mg Tabs (Tadalafil) .... Take As Directed 16)  Nitrolingual 0.4 Mg/spray Tl Soln (Nitroglycerin) .... Use As Directed 17)  Symbicort 160-4.5 Mcg/act Aero (Budesonide-Formoterol Fumarate) .... Two Puff By Mouth Bid  Allergies (verified): 1)  ! 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  Past Medical History: Reviewed history from 05/17/2007 and no changes required. Anxiety Hyperlipidemia Hypertension Myocardial infarction, hx of Allergic rhinitis  Past Surgical History: Reviewed history from 04/06/2007 and no changes required. 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  Social History: Reviewed history from 05/17/2007 and no changes required. 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    Physical Exam  General:  Well-developed,well-nourished,in no acute distress; alert,appropriate and cooperative throughout examination Head:  normocephalic and male-pattern balding.   Eyes:  vision grossly intact, pupils equal, and pupils round.   Ears:  R ear normal and L ear normal.   Nose:  nasal dischargemucosal pallor, mucosal edema, and airflow obstruction.   Mouth:  pharyngeal erythema, posterior lymphoid hypertrophy, postnasal drip, and pharyngeal crowding.   Neck:  No deformities, masses, or tenderness noted. Lungs:  normal respiratory effort, R base crackles, and R wheezes.   Heart:  normal rate and regular rhythm.   Abdomen:  Bowel  sounds positive,abdomen soft and non-tender without masses, organomegaly or hernias noted.   Impression & Recommendations:  Problem # 1:  PSA, INCREASED (ICD-790.93) movemnet of psa very slight with change form 2.18 to 2.2 over 3 months monter in 3 months and if velocity remains low... follow if velocity increased refer for bx if indiciatied ( [place] or grapy)  Problem # 2:  DYSMETABOLIC SYNDROME X (ICD-277.7) weight control is the key theraputic intervention  Problem # 3:  HYPERTENSION (ICD-401.9)  His updated medication list for this problem includes:    Bisoprolol-hydrochlorothiazide 5-6.25 Mg  Tabs (Bisoprolol-hydrochlorothiazide) ..... Once daily    Losartan Potassium 100 Mg Tabs (Losartan potassium) .Marland Kitchen... 1 once daily  BP today: 140/80 Prior BP: 136/84 (09/15/2009)  Prior 10 Yr Risk Heart Disease: N/A (05/17/2007)  Labs Reviewed: K+: 4.5 (09/08/2009) Creat: : 1.1 (09/08/2009)   Chol: 99 (09/08/2009)   HDL: 31.90 (09/08/2009)   LDL: 52 (09/08/2009)   TG: 74.0 (09/08/2009)  Problem # 4:  HYPERLIPIDEMIA (ICD-272.4)  His updated medication list for this problem includes:    Crestor 20 Mg Tabs (Rosuvastatin calcium) ..... Once daily    Lovaza 1 Gm Caps (Omega-3-acid ethyl esters) .Marland Kitchen... 1 two times a day  Labs Reviewed: SGOT: 29 (09/08/2009)   SGPT: 44 (09/08/2009)  Lipid Goals: Chol Goal: 200 (05/17/2007)   HDL Goal: 40 (05/17/2007)   LDL Goal: 100 (05/17/2007)   TG Goal: 150 (05/17/2007)  Prior 10 Yr Risk Heart Disease: N/A (05/17/2007)   HDL:31.90 (09/08/2009), 28.80 (04/21/2009)  LDL:52 (09/08/2009), 49 (04/21/2009)  Chol:99 (09/08/2009), 104 (04/21/2009)  Trig:74.0 (09/08/2009), 129.0 (04/21/2009)  Problem # 5:  ANXIETY (ICD-300.00)  His updated medication list for this problem includes:    Alprazolam 0.5 Mg Tabs (Alprazolam) .Marland Kitchen... As needed  Discussed medication use and relaxation techniques.   Complete Medication List: 1)  Bisoprolol-hydrochlorothiazide 5-6.25 Mg Tabs (Bisoprolol-hydrochlorothiazide) .... Once daily 2)  Neurpath-b 3-35-2 Mg Tabs (L-methylfolate-b6-b12) .Marland Kitchen.. 1 once daily 3)  Aspirin 325 Mg Tbec (Aspirin) .Marland Kitchen.. 1 two times a day 4)  Multivitamins Caps (Multiple vitamin) .... Once daily 5)  Losartan Potassium 100 Mg Tabs (Losartan potassium) .Marland Kitchen.. 1 once daily 6)  Elidel 1 % Crea (Pimecrolimus) .... As needed 7)  Niacin Flush Free 500 Mg Caps (Inositol niacinate) .... Once daily 8)  Vitamin B-6 500 Mg Tabs (Pyridoxine hcl) .... Once daily 9)  Folic Acid 800 Mcg Tabs (Folic acid) .... 2 once daily 10)  Crestor 20 Mg Tabs (Rosuvastatin calcium) ....  Once daily 11)  Sonata 10 Mg Caps (Zaleplon) .... As needed 12)  Famvir 500 Mg Tabs (Famciclovir) .... As needed 13)  Alprazolam 0.5 Mg Tabs (Alprazolam) .... As needed 14)  Lovaza 1 Gm Caps (Omega-3-acid ethyl esters) .Marland Kitchen.. 1 two times a day 15)  Cialis 20 Mg Tabs (Tadalafil) .... Take as directed 16)  Nitrolingual 0.4 Mg/spray Tl Soln (Nitroglycerin) .... Use as directed 17)  Symbicort 160-4.5 Mcg/act Aero (Budesonide-formoterol fumarate) .... Two puff by mouth bid  Hypertension Assessment/Plan:      The patient's hypertensive risk group is category C: Target organ damage and/or diabetes.  Today's blood pressure is 140/80.  His blood pressure goal is < 140/90.  Patient Instructions: 1)  Please schedule a follow-up appointment in 2 months. 2)  BMP prior to visit, ICD-9:401.90 3)  Hepatic Panel prior to visit, ICD-9:995.20 4)  Lipid Panel prior to visit, ICD-9:272.4 5)  PSA   and a free psa prior to visit, ICD-9:601.0

## 2010-09-08 NOTE — Progress Notes (Signed)
Summary: lab results  Phone Note Call from Patient   Caller: Patient Call For: Stacie Glaze MD Reason for Call: Acute Illness Summary of Call: Calling for lab results.  May leave message. [phone] Initial call taken by: Lynann [name] CMA,  March 26, 2010 10:55 AM  Follow-up for Phone Call        ionformed Follow-up by: Willy Eddy, LPN,  March 26, 2010 11:10 AM

## 2010-09-08 NOTE — Progress Notes (Signed)
  Phone Note Call from Patient   Caller: Patient Call For: Stacie Glaze MD Summary of Call: Pt given PSA numbers. Initial call taken by: Lynann [name] CMA,  November 14, 2009 1:50 PM

## 2010-09-08 NOTE — Assessment & Plan Note (Signed)
Summary: 2 month fup//ccm   Vital Signs:  Patient profile:   55 year old male Height:      72 inches Weight:      226 pounds BMI:     30.76 Temp:     98.2 degrees F oral Pulse rate:   72 / minute Resp:     14 per minute BP sitting:   136 / 80  (left arm)  Vitals Entered By: Willy Eddy, LPN (June 10, 2010 9:33 AM) CC: roa labs, Hypertension Management, Lipid Management Is Patient Diabetic? Yes Did you bring your meter with you today? No   Primary Care Provider:  Stacie Glaze MD  CC:  roa labs, Hypertension Management, and Lipid Management.  History of Present Illness: follow up for  lipids weight and  blood pressure control  Hypertension Follow-Up      This is a 55 year old man who presents for Hypertension follow-up.  The patient denies lightheadedness, urinary frequency, headaches, edema, impotence, rash, and fatigue.  The patient denies the following associated symptoms: chest pain, chest pressure, exercise intolerance, dyspnea, palpitations, syncope, leg edema, and pedal edema.  Compliance with medications (by patient report) has been near 100%.  The patient reports that dietary compliance has been good.  The patient reports exercising occasionally.    Hypertension History:      He denies headache, chest pain, palpitations, dyspnea with exertion, orthopnea, PND, peripheral edema, visual symptoms, neurologic problems, syncope, and side effects from treatment.        Positive major cardiovascular risk factors include male age 56 years old or older, diabetes, hyperlipidemia, and hypertension.  Negative major cardiovascular risk factors include non-tobacco-user status.        Positive history for target organ damage include ASHD (either angina/prior MI/prior CABG).  Further assessment for target organ damage reveals no history of stroke/TIA or peripheral vascular disease.    Lipid Management History:      Positive NCEP/ATP III risk factors include male age 56 years  old or older, diabetes, HDL cholesterol less than 40, hypertension, and ASHD (either angina/prior MI/prior CABG).  Negative NCEP/ATP III risk factors include non-tobacco-user status, no prior stroke/TIA, no peripheral vascular disease, and no history of aortic aneurysm.      Preventive Screening-Counseling & Management  Alcohol-Tobacco     Smoking Status: never     Year Quit:  ~1980     Tobacco Counseling: not indicated; no tobacco use     Passive Smoke Counseling: not indicated; no passive smoke exposure  Problems Prior to Update: 1)  Diab W/oth Manifests Type Ii/uns Type Uncntrl  (ICD-250.82) 2)  Stye  (ICD-373.11) 3)  Psa, Increased  (ICD-790.93) 4)  Diverticulitis, Acute  (ICD-562.11) 5)  Dysmetabolic Syndrome X  (ICD-277.7) 6)  Asthma Unspecified With Exacerbation  (ICD-493.92) 7)  Cough  (ICD-786.2) 8)  Uns Advrs Eff Uns Rx Medicinal&biological Sbstnc  (ICD-995.20) 9)  Acute Maxillary Sinusitis  (ICD-461.0) 10)  Physical Examination  (ICD-V70.0) 11)  Ankle Pain, Chronic  (ICD-719.47) 12)  Allergic Rhinitis  (ICD-477.9) 13)  Erectile Dysfunction, 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) 14)  Myocardial Infarction, Hx of  (ICD-412) 15)  Hypertension  (ICD-401.9) 16)  Hyperlipidemia  (ICD-272.4) 17)  Anxiety  (ICD-300.00)  Current Problems (verified): 1)  Diab W/oth Manifests Type Ii/uns Type Uncntrl  (ICD-250.82) 2)  Stye  (ICD-373.11) 3)  Psa, Increased  (ICD-790.93) 4)  Diverticulitis, Acute  (ICD-562.11) 5)  Dysmetabolic Syndrome X  (ICD-277.7) 6)  Asthma  Unspecified With Exacerbation  (ICD-493.92) 7)  Cough  (ICD-786.2) 8)  Uns Advrs Eff Uns Rx Medicinal&biological Sbstnc  (ICD-995.20) 9)  Acute Maxillary Sinusitis  (ICD-461.0) 10)  Physical Examination  (ICD-V70.0) 11)  Ankle Pain, Chronic  (ICD-719.47) 12)  Allergic Rhinitis  (ICD-477.9) 13)  Erectile Dysfunction, 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) 14)  Myocardial Infarction, Hx of  (ICD-412) 15)  Hypertension   (ICD-401.9) 16)  Hyperlipidemia  (ICD-272.4) 17)  Anxiety  (ICD-300.00)  Medications Prior to Update: 1)  Bisoprolol-Hydrochlorothiazide 5-6.25 Mg Tabs (Bisoprolol-Hydrochlorothiazide) .... Once Daily 2)  Neurpath-B 3-35-2 Mg Tabs (L-Methylfolate-B6-B12) .Marland Kitchen.. 1 Once Daily 3)  Aspirin 325 Mg  Tbec (Aspirin) .Marland Kitchen.. 1 Two Times A Day 4)  Multivitamins   Caps (Multiple Vitamin) .... Once Daily 5)  Losartan Potassium 100 Mg Tabs (Losartan Potassium) .Marland Kitchen.. 1 Once Daily 6)  Elidel 1 %  Crea (Pimecrolimus) .... As Needed 7)  Niacin Flush Free 500 Mg  Caps (Inositol Niacinate) .... Once Daily 8)  Vitamin B-6 500 Mg  Tabs (Pyridoxine Hcl) .... Once Daily 9)  Folic Acid 800 Mcg  Tabs (Folic Acid) .... 2 Once Daily 10)  Crestor 20 Mg  Tabs (Rosuvastatin Calcium) .... Once Daily 11)  Sonata 10 Mg  Caps (Zaleplon) .... As Needed 12)  Famvir 500 Mg  Tabs (Famciclovir) .... As Needed 13)  Alprazolam 0.5 Mg  Tabs (Alprazolam) .... As Needed 14)  Lovaza 1 Gm  Caps (Omega-3-Acid Ethyl Esters) .Marland Kitchen.. 1 Two Times A Day 15)  Cialis 20 Mg Tabs (Tadalafil) .... Take As Directed 16)  Nitrolingual 0.4 Mg/spray Tl Soln (Nitroglycerin) .... Use As Directed 17)  Symbicort 160-4.5 Mcg/act Aero (Budesonide-Formoterol Fumarate) .... Two Puff By Mouth Bid 18)  Onglyza 5 Mg Tabs (Saxagliptin Hcl) .... One By Mouth Daily  Current Medications (verified): 1)  Bisoprolol-Hydrochlorothiazide 5-6.25 Mg Tabs (Bisoprolol-Hydrochlorothiazide) .... Once Daily 2)  Neurpath-B 3-35-2 Mg Tabs (L-Methylfolate-B6-B12) .Marland Kitchen.. 1 Once Daily 3)  Aspirin 325 Mg  Tbec (Aspirin) .Marland Kitchen.. 1 Two Times A Day 4)  Multivitamins   Caps (Multiple Vitamin) .... Once Daily 5)  Losartan Potassium 100 Mg Tabs (Losartan Potassium) .Marland Kitchen.. 1 Once Daily 6)  Elidel 1 %  Crea (Pimecrolimus) .... As Needed 7)  Niacin Flush Free 500 Mg  Caps (Inositol Niacinate) .... Once Daily 8)  Vitamin B-6 500 Mg  Tabs (Pyridoxine Hcl) .... Once Daily 9)  Folic Acid 800 Mcg  Tabs  (Folic Acid) .... 2 Once Daily 10)  Crestor 20 Mg  Tabs (Rosuvastatin Calcium) .... Once Daily 11)  Sonata 10 Mg  Caps (Zaleplon) .... As Needed 12)  Famvir 500 Mg  Tabs (Famciclovir) .... As Needed 13)  Alprazolam 0.5 Mg  Tabs (Alprazolam) .... As Needed 14)  Lovaza 1 Gm  Caps (Omega-3-Acid Ethyl Esters) .Marland Kitchen.. 1 Two Times A Day 15)  Cialis 20 Mg Tabs (Tadalafil) .... Take As Directed 16)  Nitrolingual 0.4 Mg/spray Tl Soln (Nitroglycerin) .... Use As Directed 17)  Symbicort 160-4.5 Mcg/act Aero (Budesonide-Formoterol Fumarate) .... Two Puff By Mouth Bid 18)  Onglyza 5 Mg Tabs (Saxagliptin Hcl) .... One By Mouth Daily  Allergies (verified): 1)  ! 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  Past Medical History: Reviewed history from 05/17/2007 and no changes required. Anxiety Hyperlipidemia Hypertension  Myocardial infarction, hx of Allergic rhinitis  Past Surgical History: Reviewed history from 04/06/2007 and no changes required. 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  Social History: Reviewed history from 05/17/2007 and no changes required. 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         Flu Vaccine Consent Questions     Do you have a history of severe allergic reactions to this vaccine? no    Any prior history of allergic reactions to egg and/or gelatin? no    Do you have a sensitivity to the preservative Thimersol? no    Do you have a past history of Guillan-Barre Syndrome? no    Do you currently have an acute febrile illness? no    Have you ever had a severe reaction to latex? no    Vaccine information given and explained to patient? yes    Are you currently pregnant? no    Lot Number:[id number]   Exp Date:02/06/2011   Site Given  Left Deltoid IM   Physical Exam  General:  Well-developed,well-nourished,in no acute distress; alert,appropriate and cooperative throughout examination Head:  normocephalic and male-pattern balding.   Eyes:  vision grossly intact, pupils equal, and pupils round.   Ears:  R ear normal and L ear normal.   Nose:  nasal dischargemucosal pallor, mucosal edema, and airflow obstruction.   Mouth:  pharyngeal erythema, posterior lymphoid hypertrophy, postnasal drip, and pharyngeal crowding.   Neck:  No deformities, masses, or tenderness noted. Lungs:  normal respiratory effort, R base crackles, and R wheezes.   Heart:  normal rate and regular rhythm.     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 1 two times a day    Losartan Potassium 100 Mg Tabs (Losartan potassium) .Marland Kitchen... 1 once daily    Onglyza 5 Mg Tabs (Saxagliptin hcl) ..... One by mouth daily  Labs Reviewed: Creat: 1.0 (01/20/2010)    Reviewed HgBA1c results: 7.0 (06/03/2010)  8.0 (03/24/2010)  Problem # 2:  DYSMETABOLIC SYNDROME X (ICD-277.7) weight loss goals discussed  Problem # 3:  HYPERTENSION (ICD-401.9)  His updated medication list for this problem  includes:    Bisoprolol-hydrochlorothiazide 5-6.25 Mg Tabs (Bisoprolol-hydrochlorothiazide) ..... Once daily    Losartan Potassium 100 Mg Tabs (Losartan potassium) .Marland Kitchen... 1 once daily  BP today: 136/80 Prior BP: 140/80 (03/31/2010)  Prior 10 Yr Risk Heart Disease: N/A (05/17/2007)  Labs Reviewed: K+: 4.1 (01/20/2010) Creat: : 1.0 (01/20/2010)   Chol: 109 (06/03/2010)   HDL: 26.40 (06/03/2010)   LDL: 62 (06/03/2010)   TG: 102.0 (06/03/2010)  Problem # 4:  HYPERLIPIDEMIA (ICD-272.4)  His updated medication list for this problem includes:    Crestor 20 Mg Tabs (Rosuvastatin calcium) ..... Once daily    Lovaza 1 Gm Caps (Omega-3-acid ethyl esters) .Marland Kitchen... 1 two times a day  Labs Reviewed: SGOT: 20 (06/03/2010)   SGPT: 26 (06/03/2010)  Lipid Goals: Chol Goal: 200 (05/17/2007)   HDL Goal: 40 (  05/17/2007)   LDL Goal: 100 (05/17/2007)   TG Goal: 150 (05/17/2007)  Prior 10 Yr Risk Heart Disease: N/A (05/17/2007)   HDL:26.40 (06/03/2010), 32.00 (01/20/2010)  LDL:62 (06/03/2010), 71 (01/20/2010)  Chol:109 (06/03/2010), 126 (01/20/2010)  Trig:102.0 (06/03/2010), 116.0 (01/20/2010)  Complete Medication List: 1)  Bisoprolol-hydrochlorothiazide 5-6.25 Mg Tabs (Bisoprolol-hydrochlorothiazide) .... Once daily 2)  Neurpath-b 3-35-2 Mg Tabs (L-methylfolate-b6-b12) .Marland Kitchen.. 1 once daily 3)  Aspirin 325 Mg Tbec (Aspirin) .Marland Kitchen.. 1 two times a day 4)  Multivitamins Caps (Multiple vitamin) .... Once daily 5)  Losartan Potassium 100 Mg Tabs (Losartan potassium) .Marland Kitchen.. 1 once daily 6)  Elidel 1 % Crea (Pimecrolimus) .... As needed 7)  Niacin Flush Free 500 Mg Caps (Inositol niacinate) .... Once daily 8)  Vitamin B-6 500 Mg Tabs (Pyridoxine hcl) .... Once daily 9)  Folic Acid 800 Mcg Tabs (Folic acid) .... 2 once daily 10)  Crestor 20 Mg Tabs (Rosuvastatin calcium) .... Once daily 11)  Sonata 10 Mg Caps (Zaleplon) .... As needed 12)  Famvir 500 Mg Tabs (Famciclovir) .... As needed 13)  Alprazolam 0.5 Mg Tabs  (Alprazolam) .... As needed 14)  Lovaza 1 Gm Caps (Omega-3-acid ethyl esters) .Marland Kitchen.. 1 two times a day 15)  Cialis 20 Mg Tabs (Tadalafil) .... Take as directed 16)  Nitrolingual 0.4 Mg/spray Tl Soln (Nitroglycerin) .... Use as directed 17)  Symbicort 160-4.5 Mcg/act Aero (Budesonide-formoterol fumarate) .... Two puff by mouth bid 18)  Onglyza 5 Mg Tabs (Saxagliptin hcl) .... One by mouth daily  Other Orders: Admin 1st Vaccine (29562) Flu Vaccine 16yrs + [phone])  Hypertension Assessment/Plan:      The patient's hypertensive risk group is category C: Target organ damage and/or diabetes.  Today's blood pressure is 136/80.  His blood pressure goal is < 140/90.  Lipid Assessment/Plan:      Based on NCEP/ATP III, the patient's risk factor category is "history of coronary disease, peripheral vascular disease, cerebrovascular disease, or aortic aneurysm along with either diabetes, current smoker, or LDL > 130 plus HDL < 40 plus triglycerides > 200".  The patient's lipid goals are as follows: Total cholesterol goal is 200; LDL cholesterol goal is 70; HDL cholesterol goal is 40; Triglyceride goal is 150.  His LDL cholesterol goal has been met.    Patient Instructions: 1)  Feb CPX   Orders Added: 1)  Admin 1st Vaccine [90471] 2)  Flu Vaccine 1yrs + [57846] 3)  Est. Patient Level IV [96295]

## 2010-09-08 NOTE — Assessment & Plan Note (Signed)
Summary: 2 month rov/njr   Vital Signs:  Patient profile:   55 year old male Height:      72 inches Weight:      236 pounds BMI:     32.12 Temp:     98.2 degrees F oral Pulse rate:   72 / minute Resp:     14 per minute BP sitting:   146 / 80  (left arm)  Vitals Entered By: Willy Eddy, LPN (January 27, 2010 10:41 AM)  Primary Care Provider:  Stacie Glaze MD   History of Present Illness: FOLLOW UP ELEVATED psa ELEVATED BLOOD SUGAR AND LIPID PROFILE  Follow-Up Visit      This is a 55 year old man who presents for Follow-up visit.  The patient denies chest pain, palpitations, dizziness, syncope, low blood sugar symptoms, high blood sugar symptoms, edema, SOB, DOE, PND, and orthopnea.  Since the last visit the patient notes problems with medications.  The patient reports taking meds as prescribed, monitoring BP, and dietary noncompliance.  When questioned about possible medication side effects, the patient notes none.    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 Once Daily 2)  Neurpath-B 3-35-2 Mg Tabs (L-Methylfolate-B6-B12) .Marland Kitchen.. 1 Once Daily 3)  Aspirin 325 Mg  Tbec (Aspirin) .Marland Kitchen.. 1 Two Times A Day 4)  Multivitamins   Caps (Multiple Vitamin) .... Once Daily 5)   Losartan Potassium 100 Mg Tabs (Losartan Potassium) .Marland Kitchen.. 1 Once Daily 6)  Elidel 1 %  Crea (Pimecrolimus) .... As Needed 7)  Niacin Flush Free 500 Mg  Caps (Inositol Niacinate) .... Once Daily 8)  Vitamin B-6 500 Mg  Tabs (Pyridoxine Hcl) .... Once Daily 9)  Folic Acid 800 Mcg  Tabs (Folic Acid) .... 2 Once Daily 10)  Crestor 20 Mg  Tabs (Rosuvastatin Calcium) .... Once Daily 11)  Sonata 10 Mg  Caps (Zaleplon) .... As Needed 12)  Famvir 500 Mg  Tabs (Famciclovir) .... As Needed 13)  Alprazolam 0.5 Mg  Tabs (Alprazolam) .... As Needed 14)  Lovaza 1 Gm  Caps (Omega-3-Acid Ethyl Esters) .Marland Kitchen.. 1 Two Times A Day 15)  Cialis 20 Mg Tabs (Tadalafil) .... Take As Directed 16)  Nitrolingual 0.4 Mg/spray Tl Soln (Nitroglycerin) .... Use As Directed 17)  Symbicort 160-4.5 Mcg/act Aero (Budesonide-Formoterol Fumarate) .... Two Puff By Mouth Bid  Current Medications (verified): 1)  Bisoprolol-Hydrochlorothiazide 5-6.25 Mg Tabs (Bisoprolol-Hydrochlorothiazide) .... Once Daily 2)  Neurpath-B 3-35-2 Mg Tabs (L-Methylfolate-B6-B12) .Marland Kitchen.. 1 Once Daily 3)  Aspirin 325 Mg  Tbec (Aspirin) .Marland Kitchen.. 1 Two Times A Day 4)  Multivitamins   Caps (Multiple Vitamin) .... Once Daily 5)  Losartan Potassium 100 Mg Tabs (Losartan Potassium) .Marland Kitchen.. 1 Once Daily 6)  Elidel 1 %  Crea (Pimecrolimus) .... As Needed 7)  Niacin Flush Free 500 Mg  Caps (Inositol Niacinate) .... Once  Daily 8)  Vitamin B-6 500 Mg  Tabs (Pyridoxine Hcl) .... Once Daily 9)  Folic Acid 800 Mcg  Tabs (Folic Acid) .... 2 Once Daily 10)  Crestor 20 Mg  Tabs (Rosuvastatin Calcium) .... Once Daily 11)  Sonata 10 Mg  Caps (Zaleplon) .... As Needed 12)  Famvir 500 Mg  Tabs (Famciclovir) .... As Needed 13)  Alprazolam 0.5 Mg  Tabs (Alprazolam) .... As Needed 14)  Lovaza 1 Gm  Caps (Omega-3-Acid Ethyl Esters) .Marland Kitchen.. 1 Two Times A Day 15)  Cialis 20 Mg Tabs (Tadalafil) .... Take As Directed 16)  Nitrolingual 0.4 Mg/spray Tl Soln (Nitroglycerin) .... Use As  Directed 17)  Symbicort 160-4.5 Mcg/act Aero (Budesonide-Formoterol Fumarate) .... Two Puff By Mouth Bid  Allergies (verified): 1)  ! 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  Past Medical History: Reviewed history from 05/17/2007 and no changes required. Anxiety Hyperlipidemia Hypertension Myocardial infarction, hx of Allergic rhinitis  Past Surgical History: Reviewed history from 04/06/2007 and no changes required. 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  Social History: Reviewed history from 05/17/2007 and no changes required. 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    Physical Exam  General:  Well-developed,well-nourished,in no acute distress; alert,appropriate and cooperative throughout examination Head:  normocephalic and male-pattern balding.   Eyes:  vision grossly intact, pupils equal, and pupils round.   Ears:  R ear normal and L ear normal.   Nose:  nasal dischargemucosal pallor, mucosal edema, and airflow obstruction.   Mouth:  pharyngeal erythema, posterior lymphoid hypertrophy, postnasal drip, and pharyngeal  crowding.   Neck:  No deformities, masses, or tenderness noted. Lungs:  normal respiratory effort, R base crackles, and R wheezes.   Heart:  normal rate and regular rhythm.   Abdomen:  Bowel sounds positive,abdomen soft and non-tender without masses, organomegaly or hernias noted.   Impression & Recommendations:  Problem # 1:  PSA, INCREASED (ICD-790.93) PSA DROPPED WIT THERAPY  Problem # 2:  DYSMETABOLIC SYNDROME X (ICD-277.7) WEIGTH GAIN PUTS AT INCREASED RISK  THE BLOOD SUGAR IS 160 FASTING AND WILL NEED TO WEIGTH LOSS  Problem # 3:  HYPERTENSION (ICD-401.9)  His updated medication list for this problem includes:    Bisoprolol-hydrochlorothiazide 5-6.25 Mg Tabs (Bisoprolol-hydrochlorothiazide) ..... Once daily    Losartan Potassium 100 Mg Tabs (Losartan potassium) .Marland Kitchen... 1 once daily  BP today: 146/80 Prior BP: 140/80 (11/17/2009)  Prior 10 Yr Risk Heart Disease: N/A (05/17/2007)  Labs Reviewed: K+: 4.1 (01/20/2010) Creat: : 1.0 (01/20/2010)   Chol: 126 (01/20/2010)   HDL: 32.00 (01/20/2010)   LDL: 71 (01/20/2010)   TG: 116.0 (01/20/2010)  Problem # 4:  HYPERLIPIDEMIA (ICD-272.4)  His updated medication list for this problem includes:    Crestor 20 Mg Tabs (Rosuvastatin calcium) ..... Once daily    Lovaza 1 Gm Caps (Omega-3-acid ethyl esters) .Marland Kitchen... 1 two times a day  Labs Reviewed: SGOT: 21 (01/20/2010)   SGPT: 26 (01/20/2010)  Lipid Goals: Chol Goal: 200 (05/17/2007)   HDL Goal: 40 (05/17/2007)   LDL Goal: 100 (05/17/2007)   TG Goal: 150 (05/17/2007)  Prior 10 Yr Risk Heart Disease: N/A (05/17/2007)   HDL:32.00 (01/20/2010), 31.90 (09/08/2009)  LDL:71 (01/20/2010), 52 (09/08/2009)  Chol:126 (01/20/2010), 99 (09/08/2009)  Trig:116.0 (01/20/2010), 74.0 (09/08/2009)  Problem # 5:  STYE (ICD-373.11) eye drops  Complete Medication List: 1)  Bisoprolol-hydrochlorothiazide 5-6.25 Mg Tabs (Bisoprolol-hydrochlorothiazide) .... Once daily 2)  Neurpath-b 3-35-2 Mg Tabs  (L-methylfolate-b6-b12) .Marland Kitchen.. 1 once daily 3)  Aspirin 325 Mg Tbec (Aspirin) .Marland Kitchen.. 1 two times a day 4)  Multivitamins Caps (Multiple vitamin) .... Once daily 5)  Losartan Potassium 100 Mg Tabs (Losartan potassium) .Marland Kitchen.. 1 once daily 6)  Elidel 1 % Crea (Pimecrolimus) .... As needed 7)  Niacin Flush Free 500 Mg Caps (Inositol niacinate) .... Once daily 8)  Vitamin B-6 500 Mg Tabs (Pyridoxine hcl) .... Once daily 9)  Folic Acid 800 Mcg Tabs (Folic acid) .... 2 once daily 10)  Crestor 20 Mg Tabs (Rosuvastatin calcium) .... Once daily 11)  Sonata 10 Mg Caps (Zaleplon) .... As needed 12)  Famvir 500 Mg Tabs (Famciclovir) .... As needed 13)  Alprazolam 0.5 Mg Tabs (Alprazolam) .... As needed 14)  Lovaza 1 Gm Caps (Omega-3-acid ethyl esters) .Marland Kitchen.. 1 two times a day 15)  Cialis 20 Mg Tabs (Tadalafil) .... Take as directed 16)  Nitrolingual 0.4 Mg/spray Tl Soln (Nitroglycerin) .... Use as directed 17)  Symbicort 160-4.5 Mcg/act Aero (Budesonide-formoterol fumarate) .... Two puff by mouth bid 18)  Ciprofloxacin Hcl 0.3 % Soln (Ciprofloxacin hcl) .... 4 drops in affected eye two times a day  Patient Instructions: 1)  Please schedule a follow-up appointment in 2 months. 2)  HbgA1C prior to visit, ICD-9:250.00 Prescriptions: CIPROFLOXACIN HCL 0.3 % SOLN (CIPROFLOXACIN HCL) 4 drops in affected eye two times a day  #5cc x 1   Entered and Authorized by:   Stacie Glaze MD   Signed by:   Stacie Glaze MD on 01/27/2010   Method used:   Electronically to        [id number] (retail)       [address]       [place], [place]  [address]       Ph: [phone] or [phone]       Fax: [phone]   RxID:   [id number]

## 2010-09-08 NOTE — Assessment & Plan Note (Signed)
Summary: 2 month follow up/cjr   Vital Signs:  Patient profile:   55 year old male Height:      72 inches Weight:      226 pounds BMI:     30.76 Temp:     98.2 degrees F oral Pulse rate:   72 / minute Resp:     14 per minute BP sitting:   140 / 80  (left arm)  Vitals Entered By: Willy Eddy, LPN (March 31, 2010 9:25 AM) CC: roas labs, Hypertension Management Is Patient Diabetic? Yes Did you bring your meter with you today? No   Primary Care Provider:  Stacie Glaze MD  CC:  roas labs and Hypertension Management.  History of Present Illness: lost weight but job stress and care for elderly mother has increased stress DM has been    Hypertension History:      He denies headache, chest pain, palpitations, dyspnea with exertion, orthopnea, PND, peripheral edema, visual symptoms, neurologic problems, syncope, and side effects from treatment.  stable.        Positive major cardiovascular risk factors include male age 88 years old or older, diabetes, hyperlipidemia, and hypertension.  Negative major cardiovascular risk factors include non-tobacco-user status.        Positive history for target organ damage include ASHD (either angina/prior MI/prior CABG).  Further assessment for target organ damage reveals no history of stroke/TIA or peripheral vascular disease.     Preventive Screening-Counseling & Management  Alcohol-Tobacco     Smoking Status: never     Year Quit:  ~1980     Tobacco Counseling: not indicated; no tobacco use     Passive Smoke Counseling: not indicated; no passive smoke exposure  Problems Prior to Update: 1)  Stye  (ICD-373.11) 2)  Psa, Increased  (ICD-790.93) 3)  Diverticulitis, Acute  (ICD-562.11) 4)  Dysmetabolic Syndrome X  (ICD-277.7) 5)  Asthma Unspecified With Exacerbation  (ICD-493.92) 6)  Cough  (ICD-786.2) 7)  Uns Advrs Eff Uns Rx Medicinal&biological Sbstnc  (ICD-995.20) 8)  Acute Maxillary Sinusitis  (ICD-461.0) 9)  Physical  Examination  (ICD-V70.0) 10)  Ankle Pain, Chronic  (ICD-719.47) 11)  Allergic Rhinitis  (ICD-477.9) 12)  Erectile Dysfunction, 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) 13)  Myocardial Infarction, Hx of  (ICD-412) 14)  Hypertension  (ICD-401.9) 15)  Hyperlipidemia  (ICD-272.4) 16)  Anxiety  (ICD-300.00)  Current Problems (verified): 1)  Stye  (ICD-373.11) 2)  Psa, Increased  (ICD-790.93) 3)  Diverticulitis, Acute  (ICD-562.11) 4)  Dysmetabolic Syndrome X  (ICD-277.7) 5)  Asthma Unspecified With Exacerbation  (ICD-493.92) 6)  Cough  (ICD-786.2) 7)  Uns Advrs Eff Uns Rx Medicinal&biological Sbstnc  (ICD-995.20) 8)  Acute Maxillary Sinusitis  (ICD-461.0) 9)  Physical Examination  (ICD-V70.0) 10)  Ankle Pain, Chronic  (ICD-719.47) 11)  Allergic Rhinitis  (ICD-477.9) 12)  Erectile Dysfunction, 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) 13)  Myocardial Infarction, Hx of  (ICD-412) 14)  Hypertension  (ICD-401.9) 15)  Hyperlipidemia  (ICD-272.4) 16)  Anxiety  (ICD-300.00)  Medications Prior to Update: 1)  Bisoprolol-Hydrochlorothiazide 5-6.25 Mg Tabs (Bisoprolol-Hydrochlorothiazide) .... Once Daily 2)  Neurpath-B 3-35-2 Mg Tabs (L-Methylfolate-B6-B12) .Marland Kitchen.. 1 Once Daily 3)  Aspirin 325 Mg  Tbec (Aspirin) .Marland Kitchen.. 1 Two Times A Day 4)  Multivitamins   Caps (Multiple Vitamin) .... Once Daily 5)  Losartan Potassium 100 Mg Tabs (Losartan Potassium) .Marland Kitchen.. 1 Once Daily 6)  Elidel 1 %  Crea (Pimecrolimus) .... As Needed 7)  Niacin Flush Free 500 Mg  Caps (Inositol Niacinate) .... Once Daily 8)  Vitamin B-6 500 Mg  Tabs (Pyridoxine Hcl) .... Once Daily 9)  Folic Acid 800 Mcg  Tabs (Folic Acid) .... 2 Once Daily 10)  Crestor 20 Mg  Tabs (Rosuvastatin Calcium) .... Once Daily 11)  Sonata 10 Mg  Caps (Zaleplon) .... As Needed 12)  Famvir 500 Mg  Tabs (Famciclovir) .... As Needed 13)  Alprazolam 0.5 Mg  Tabs (Alprazolam) .... As Needed 14)  Lovaza 1 Gm  Caps (Omega-3-Acid Ethyl Esters) .Marland Kitchen.. 1 Two Times A  Day 15)  Cialis 20 Mg Tabs (Tadalafil) .... Take As Directed 16)  Nitrolingual 0.4 Mg/spray Tl Soln (Nitroglycerin) .... Use As Directed 17)  Symbicort 160-4.5 Mcg/act Aero (Budesonide-Formoterol Fumarate) .... Two Puff By Mouth Bid 18)  Ciprofloxacin Hcl 0.3 % Soln (Ciprofloxacin Hcl) .... 4 Drops in Affected Eye Two Times A Day  Current Medications (verified): 1)  Bisoprolol-Hydrochlorothiazide 5-6.25 Mg Tabs (Bisoprolol-Hydrochlorothiazide) .... Once Daily 2)  Neurpath-B 3-35-2 Mg Tabs (L-Methylfolate-B6-B12) .Marland Kitchen.. 1 Once Daily 3)  Aspirin 325 Mg  Tbec (Aspirin) .Marland Kitchen.. 1 Two Times A Day 4)  Multivitamins   Caps (Multiple Vitamin) .... Once Daily 5)  Losartan Potassium 100 Mg Tabs (Losartan Potassium) .Marland Kitchen.. 1 Once Daily 6)  Elidel 1 %  Crea (Pimecrolimus) .... As Needed 7)  Niacin Flush Free 500 Mg  Caps (Inositol Niacinate) .... Once Daily 8)  Vitamin B-6 500 Mg  Tabs (Pyridoxine Hcl) .... Once Daily 9)  Folic Acid 800 Mcg  Tabs (Folic Acid) .... 2 Once Daily 10)  Crestor 20 Mg  Tabs (Rosuvastatin Calcium) .... Once Daily 11)  Sonata 10 Mg  Caps (Zaleplon) .... As Needed 12)  Famvir 500 Mg  Tabs (Famciclovir) .... As Needed 13)  Alprazolam 0.5 Mg  Tabs (Alprazolam) .... As Needed 14)  Lovaza 1 Gm  Caps (Omega-3-Acid Ethyl Esters) .Marland Kitchen.. 1 Two Times A Day 15)  Cialis 20 Mg Tabs (Tadalafil) .... Take As Directed 16)  Nitrolingual 0.4 Mg/spray Tl Soln (Nitroglycerin) .... Use As Directed 17)  Symbicort 160-4.5 Mcg/act Aero (Budesonide-Formoterol Fumarate) .... Two Puff By Mouth Bid  Allergies (verified): 1)  ! 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  Past Medical History: Reviewed history from 05/17/2007  and no changes required. Anxiety Hyperlipidemia Hypertension Myocardial infarction, hx of Allergic rhinitis  Past Surgical History: Reviewed history from 04/06/2007 and no changes required. 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  Social History: Reviewed history from 05/17/2007 and no changes required. 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    Physical Exam  General:  Well-developed,well-nourished,in no acute distress; alert,appropriate and cooperative throughout examination Head:  normocephalic and male-pattern balding.   Eyes:  vision grossly intact, pupils equal, and pupils round.   Ears:  R ear normal and L ear normal.   Nose:  nasal dischargemucosal pallor, mucosal edema, and airflow obstruction.  Mouth:  pharyngeal erythema, posterior lymphoid hypertrophy, postnasal drip, and pharyngeal crowding.   Neck:  No deformities, masses, or tenderness noted. Lungs:  normal respiratory effort, R base crackles, and R wheezes.   Heart:  normal rate and regular rhythm.   Abdomen:  Bowel sounds positive,abdomen soft and non-tender without masses, organomegaly or hernias noted.   Impression & Recommendations:  Problem # 1:  HYPERTENSION (ICD-401.9) Assessment Unchanged  His updated medication list for this problem includes:    Bisoprolol-hydrochlorothiazide 5-6.25 Mg Tabs (Bisoprolol-hydrochlorothiazide) .....  Once daily    Losartan Potassium 100 Mg Tabs (Losartan potassium) .Marland Kitchen... 1 once daily  BP today: 140/80 Prior BP: 146/80 (01/27/2010)  Prior 10 Yr Risk Heart Disease: N/A (05/17/2007)  Labs Reviewed: K+: 4.1 (01/20/2010) Creat: : 1.0 (01/20/2010)   Chol: 126 (01/20/2010)   HDL: 32.00 (01/20/2010)   LDL: 71 (01/20/2010)   TG: 116.0 (01/20/2010)  Problem # 2:  HYPERLIPIDEMIA (ICD-272.4) Assessment: Unchanged  His updated medication list for this problem includes:    Crestor 20 Mg Tabs (Rosuvastatin calcium) ..... Once daily    Lovaza 1 Gm Caps (Omega-3-acid ethyl esters) .Marland Kitchen... 1 two times a day  Labs Reviewed: SGOT: 21 (01/20/2010)   SGPT: 26 (01/20/2010)  Lipid Goals: Chol Goal: 200 (05/17/2007)   HDL Goal: 40 (05/17/2007)   LDL Goal: 100 (05/17/2007)   TG Goal: 150 (05/17/2007)  Prior 10 Yr Risk Heart Disease: N/A (05/17/2007)   HDL:32.00 (01/20/2010), 31.90 (09/08/2009)  LDL:71 (01/20/2010), 52 (09/08/2009)  Chol:126 (01/20/2010), 99 (09/08/2009)  Trig:116.0 (01/20/2010), 74.0 (09/08/2009)  Problem # 3:  DIAB W/OTH MANIFESTS TYPE II/UNS TYPE UNCNTRL (ICD-250.82) the  pt has crossed the mark His updated medication list for this problem includes:    Aspirin 325 Mg Tbec (Aspirin) .Marland Kitchen... 1 two times a day    Losartan Potassium 100 Mg Tabs (Losartan potassium) .Marland Kitchen... 1 once daily    Onglyza 5 Mg Tabs (Saxagliptin hcl) ..... One by mouth daily  Labs Reviewed: Creat: 1.0 (01/20/2010)    Reviewed HgBA1c results: 8.0 (03/24/2010)  See Comment % (08/25/2006)  Complete Medication List: 1)  Bisoprolol-hydrochlorothiazide 5-6.25 Mg Tabs (Bisoprolol-hydrochlorothiazide) .... Once daily 2)  Neurpath-b 3-35-2 Mg Tabs (L-methylfolate-b6-b12) .Marland Kitchen.. 1 once daily 3)  Aspirin 325 Mg Tbec (Aspirin) .Marland Kitchen.. 1 two times a day 4)  Multivitamins Caps (Multiple vitamin) .... Once daily 5)  Losartan Potassium 100 Mg Tabs (Losartan potassium) .Marland Kitchen.. 1 once daily 6)  Elidel 1 % Crea (Pimecrolimus) .... As  needed 7)  Niacin Flush Free 500 Mg Caps (Inositol niacinate) .... Once daily 8)  Vitamin B-6 500 Mg Tabs (Pyridoxine hcl) .... Once daily 9)  Folic Acid 800 Mcg Tabs (Folic acid) .... 2 once daily 10)  Crestor 20 Mg Tabs (Rosuvastatin calcium) .... Once daily 11)  Sonata 10 Mg Caps (Zaleplon) .... As needed 12)  Famvir 500 Mg Tabs (Famciclovir) .... As needed 13)  Alprazolam 0.5 Mg Tabs (Alprazolam) .... As needed 14)  Lovaza 1 Gm Caps (Omega-3-acid ethyl esters) .Marland Kitchen.. 1 two times a day 15)  Cialis 20 Mg Tabs (Tadalafil) .... Take as directed 16)  Nitrolingual 0.4 Mg/spray Tl Soln (Nitroglycerin) .... Use as directed 17)  Symbicort 160-4.5 Mcg/act Aero (Budesonide-formoterol fumarate) .... Two puff by mouth bid 18)  Onglyza 5 Mg Tabs (Saxagliptin hcl) .... One by mouth daily  Hypertension Assessment/Plan:      The patient's hypertensive risk group is category C: Target organ damage and/or diabetes.  Today's blood pressure is 140/80.  His blood  pressure goal is < 140/90.  Patient Instructions: 1)  Please schedule a follow-up appointment in 2 months. 2)  Hepatic Panel prior to visit, ICD   995.20 3)  Lipid Panel prior to visit, ICD-9:272.4 4)  HbgA1C prior to visit, ICD-9:250.00 Prescriptions: ONGLYZA 5 MG TABS (SAXAGLIPTIN HCL) one by mouth daily  #30 x 6   Entered and Authorized by:   Stacie Glaze MD   Signed by:   Stacie Glaze MD on 03/31/2010   Method used:   Electronically to        [id number] (retail)       [address]       [place], [place]  [address]       Ph: [phone] or [phone]       Fax: [phone]   RxID:   [id number]

## 2010-09-08 NOTE — Progress Notes (Signed)
Summary: mother as patient  Phone Note Call from Patient Call back at [phone]   Caller: vm Call For: bonnye Summary of Call: Debating move mother here from [place] nursing home.   Would Dr. Shela Commons take her on has patient?  I would bring her to him.   Does have health issues.  If not would he recommend an eldercare physician?     Initial call taken by: Rudy Jew, RN,  April 08, 2010 3:29 PM  Follow-up for Phone Call        if she moves here to a nursing home she must use the facility nursung home- dr Lovell Sheehan doesnt go to nursing homes- dr green is an excellent choice and is in most nursing homes Follow-up by: Willy Eddy, LPN,  April 08, 2010 4:59 PM  Additional Follow-up for Phone Call Additional follow up Details #1::        [facility] per [place].  Dr. Frederik Pear.   Additional Follow-up by: Rudy Jew, RN,  April 08, 2010 5:02 PM

## 2010-09-16 NOTE — Assessment & Plan Note (Signed)
Summary: back pain//ccm   Vital Signs:  Patient profile:   55 year old male Weight:      235 pounds Pulse rate:   76 / minute BP sitting:   142 / 80  (right arm)  Vitals Entered By: Kyung Rudd, CMA (September 08, 2010 8:52 AM) CC: pt c/o lower rt side back pain x 1wk...inteferring with sleep pattern   Primary Care Provider:  Stacie Glaze MD  CC:  pt c/o lower rt side back pain x 1wk...inteferring with sleep pattern.  History of Present Illness:  patient presents to clinic as a work in for evaluation of back pain. notes one week history of right low back pain it radiates slightly to anterior leg. No paresthesias and no leg weakness. Denies urinary incontinence. his been no injury or trauma. massage did not help. Tylenol helped mildly. no previous history of chronic back pain. pain worse in the morning and improves during the day. no other complaints.  Current Medications (verified): 1)  Bisoprolol-Hydrochlorothiazide 5-6.25 Mg Tabs (Bisoprolol-Hydrochlorothiazide) .... Once Daily 2)  Neurpath-B 3-35-2 Mg Tabs (L-Methylfolate-B6-B12) .Marland Kitchen.. 1 Once Daily 3)  Aspirin 325 Mg  Tbec (Aspirin) .Marland Kitchen.. 1 Two Times A Day 4)  Multivitamins   Caps (Multiple Vitamin) .... Once Daily 5)  Losartan Potassium 100 Mg Tabs (Losartan Potassium) .Marland Kitchen.. 1 Once Daily 6)  Elidel 1 %  Crea (Pimecrolimus) .... As Needed 7)  Niacin Flush Free 500 Mg  Caps (Inositol Niacinate) .... Once Daily 8)  Vitamin B-6 500 Mg  Tabs (Pyridoxine Hcl) .... Once Daily 9)  Folic Acid 800 Mcg  Tabs (Folic Acid) .... 2 Once Daily 10)  Crestor 20 Mg  Tabs (Rosuvastatin Calcium) .... Once Daily 11)  Sonata 10 Mg  Caps (Zaleplon) .... As Needed 12)  Famvir 500 Mg  Tabs (Famciclovir) .... As Needed 13)  Alprazolam 0.5 Mg  Tabs (Alprazolam) .... As Needed 14)  Lovaza 1 Gm  Caps (Omega-3-Acid Ethyl Esters) .Marland Kitchen.. 1 Two Times A Day 15)  Cialis 20 Mg Tabs (Tadalafil) .... Take As Directed 16)  Nitrolingual 0.4 Mg/spray Tl Soln  (Nitroglycerin) .... Use As Directed 17)  Symbicort 160-4.5 Mcg/act Aero (Budesonide-Formoterol Fumarate) .... Two Puff By Mouth Bid 18)  Onglyza 5 Mg Tabs (Saxagliptin Hcl) .... One By Mouth Daily  Allergies (verified): 1)  ! Morphine Sulfate Cr (Morphine Sulfate)  Past History:  Past medical, surgical, family and social histories (including risk factors) reviewed for relevance to current acute and chronic problems.  Past Medical History: Reviewed history from 05/17/2007 and no changes required. Anxiety Hyperlipidemia Hypertension Myocardial infarction, hx of Allergic rhinitis  Past Surgical History: Reviewed history from 04/06/2007 and no changes required. 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  Social History: Reviewed history from 05/17/2007 and no changes required. 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 GU:  Denies hematuria, incontinence, and urinary frequency. MS:  Complains of low back pain; denies joint pain, joint redness, joint swelling, and muscle weakness. Neuro:  Denies brief paralysis, numbness, tingling, and weakness.  Physical Exam  General:  Well-developed,well-nourished,in no acute distress; alert,appropriate and cooperative throughout examination Head:  Normocephalic and atraumatic without obvious abnormalities. No apparent alopecia or balding. Eyes:  pupils equal, pupils round, corneas and lenses clear, and no injection.   Ears:  no external deformities.   Nose:  no external deformity.   Msk:   normal flexion and extension at  the waist. no lumbar  or sacral midline tenderness. No bony abnormality. Mild right paraspinal muscle tenderness and spasm. Straight leg raising negative bilaterally. Bilateral lower extremity strength of 5 /5. gait normal. able weight-bear and  ambulate without assistance. Neurologic:  alert & oriented X3 and gait  normal.   Skin:  turgor normal and color normal.     Impression & Recommendations:  Problem # 1:  BACK PAIN (ICD-724.5) Assessment New  neurologically nonfocal. Attempt anti-inflammatory with food and no other anti-inflammatories. begin Flexeril when necessary. cautioned regarding possible sedating effect. followup if no improvement or worsening. His updated medication list for this problem includes:    Aspirin 325 Mg Tbec (Aspirin) .Marland Kitchen... 1 two times a day    Diclofenac Sodium 50 Mg Tbec (Diclofenac sodium) ..... One by mouth two times a day x5 days then one by mouth two times a day as needed back pain    Flexeril 10 Mg Tabs (Cyclobenzaprine hcl) ..... One by mouth three times a day as needed back pain  Complete Medication List: 1)  Bisoprolol-hydrochlorothiazide 5-6.25 Mg Tabs (Bisoprolol-hydrochlorothiazide) .... Once daily 2)  Neurpath-b 3-35-2 Mg Tabs (L-methylfolate-b6-b12) .Marland Kitchen.. 1 once daily 3)  Aspirin 325 Mg Tbec (Aspirin) .Marland Kitchen.. 1 two times a day 4)  Multivitamins Caps (Multiple vitamin) .... Once daily 5)  Losartan Potassium 100 Mg Tabs (Losartan potassium) .Marland Kitchen.. 1 once daily 6)  Elidel 1 % Crea (Pimecrolimus) .... As needed 7)  Niacin Flush Free 500 Mg Caps (Inositol niacinate) .... Once daily 8)  Vitamin B-6 500 Mg Tabs (Pyridoxine hcl) .... Once daily 9)  Folic Acid 800 Mcg Tabs (Folic acid) .... 2 once daily 10)  Crestor 20 Mg Tabs (Rosuvastatin calcium) .... Once daily 11)  Sonata 10 Mg Caps (Zaleplon) .... As needed 12)  Famvir 500 Mg Tabs (Famciclovir) .... As needed 13)  Alprazolam 0.5 Mg Tabs (Alprazolam) .... As needed 14)  Lovaza 1 Gm Caps (Omega-3-acid ethyl esters) .Marland Kitchen.. 1 two times a day 15)  Cialis 20 Mg Tabs (Tadalafil) .... Take as directed 16)  Nitrolingual 0.4 Mg/spray Tl Soln (Nitroglycerin) .... Use as directed 17)  Symbicort 160-4.5 Mcg/act Aero (Budesonide-formoterol fumarate) .... Two puff by mouth bid 18)  Onglyza 5 Mg Tabs (Saxagliptin hcl) .... One by  mouth daily 19)  Diclofenac Sodium 50 Mg Tbec (Diclofenac sodium) .... One by mouth two times a day x5 days then one by mouth two times a day as needed back pain 20)  Flexeril 10 Mg Tabs (Cyclobenzaprine hcl) .... One by mouth three times a day as needed back pain Prescriptions: FLEXERIL 10 MG TABS (CYCLOBENZAPRINE HCL) one by mouth three times a day as needed back pain  #30 x 0   Entered and Authorized by:   Edwyna Perfect MD   Signed by:   Edwyna Perfect MD on 09/08/2010   Method used:   Electronically to        [id number] ([facility]       [address]       [place], [place]  [address]       Ph: [phone] or [phone]       Fax: [phone]   RxID:   [id number] DICLOFENAC SODIUM 50 MG TBEC (DICLOFENAC SODIUM) one by mouth two times a day x5 days then one by mouth two times a day as needed back pain  #30 x 0   Entered and Authorized by:   Edwyna Perfect MD   Signed by:  Edwyna Perfect MD on 09/08/2010   Method used:   Electronically to        [id number] ([facility]       [address]       [place], [place]  [address]       Ph: [phone] or [phone]       Fax: [phone]   RxID:   [id number]    Orders Added: 1)  Est. Patient Level III [27[address]]

## 2010-09-22 ENCOUNTER — Other Ambulatory Visit (INDEPENDENT_AMBULATORY_CARE_PROVIDER_SITE_OTHER): Payer: BC Managed Care – PPO | Admitting: Internal Medicine

## 2010-09-22 DIAGNOSIS — Z0000 Encounter for general adult medical examination without abnormal findings: Secondary | ICD-10-CM

## 2010-09-22 LAB — POCT URINALYSIS DIPSTICK
Bilirubin, UA: NEGATIVE
Blood, UA: NEGATIVE
Glucose, UA: NEGATIVE
Ketones, UA: NEGATIVE
Leukocytes, UA: NEGATIVE
Nitrite, UA: NEGATIVE

## 2010-09-22 LAB — BASIC METABOLIC PANEL
BUN: 16 mg/dL (ref 6–23)
Calcium: 10 mg/dL (ref 8.4–10.5)
Creatinine, Ser: 1 mg/dL (ref 0.4–1.5)
GFR: 83.33 mL/min (ref >60.00)
Glucose, Bld: 170 mg/dL — ABNORMAL HIGH (ref 70–99)

## 2010-09-22 LAB — HEPATIC FUNCTION PANEL
AST: 21 U/L (ref 0–37)
Albumin: 3.7 g/dL (ref 3.5–5.2)

## 2010-09-22 LAB — MICROALBUMIN / CREATININE URINE RATIO
Creatinine,U: 189.1 mg/dL
Microalb Creat Ratio: 1 mg/g (ref 0.0–30.0)

## 2010-09-22 LAB — CBC WITH DIFFERENTIAL/PLATELET
Basophils Absolute: 0 10*3/uL (ref 0.0–0.1)
Hemoglobin: 15.4 g/dL (ref 13.0–17.0)
Lymphocytes Relative: 14.3 % (ref 12.0–46.0)
Monocytes Relative: 6.7 % (ref 3.0–12.0)
Platelets: 266 10*3/uL (ref 150.0–400.0)
RDW: 13.1 % (ref 11.5–14.6)

## 2010-09-22 LAB — LIPID PANEL
Cholesterol: 105 mg/dL (ref 0–200)
Triglycerides: 103 mg/dL (ref 0.0–149.0)
VLDL: 20.6 mg/dL (ref 0.0–40.0)

## 2010-09-22 LAB — PSA: PSA: 1.16 ng/mL (ref 0.10–4.00)

## 2010-09-28 ENCOUNTER — Encounter: Payer: Self-pay | Admitting: Internal Medicine

## 2010-09-29 ENCOUNTER — Ambulatory Visit (INDEPENDENT_AMBULATORY_CARE_PROVIDER_SITE_OTHER): Payer: BC Managed Care – PPO | Admitting: Internal Medicine

## 2010-09-29 ENCOUNTER — Encounter: Payer: Self-pay | Admitting: Internal Medicine

## 2010-09-29 VITALS — BP 116/80 | HR 68 | Temp 99.0°F | Resp 14 | Ht 72.0 in | Wt 232.0 lb

## 2010-09-29 DIAGNOSIS — E785 Hyperlipidemia, unspecified: Secondary | ICD-10-CM

## 2010-09-29 DIAGNOSIS — E1165 Type 2 diabetes mellitus with hyperglycemia: Secondary | ICD-10-CM

## 2010-09-29 DIAGNOSIS — IMO0002 Reserved for concepts with insufficient information to code with codable children: Secondary | ICD-10-CM

## 2010-09-29 DIAGNOSIS — Z0000 Encounter for general adult medical examination without abnormal findings: Secondary | ICD-10-CM

## 2010-09-29 DIAGNOSIS — E1169 Type 2 diabetes mellitus with other specified complication: Secondary | ICD-10-CM

## 2010-09-29 DIAGNOSIS — E8881 Metabolic syndrome: Secondary | ICD-10-CM

## 2010-09-29 DIAGNOSIS — I10 Essential (primary) hypertension: Secondary | ICD-10-CM

## 2010-09-29 DIAGNOSIS — R972 Elevated prostate specific antigen [PSA]: Secondary | ICD-10-CM

## 2010-09-29 MED ORDER — SITAGLIPTIN PHOS-METFORMIN HCL 50-1000 MG PO TABS: 1.0000 | ORAL_TABLET | Freq: Two times a day (BID) | ORAL | Status: DC

## 2010-09-29 NOTE — Progress Notes (Signed)
Subjective:    Patient ID: Larry Garner, male    DOB: 1955-09-01, 55 y.o.   MRN: [id number]  HPI   patient is a 55 year old white male who presents for routine yearly physical examination his comorbid problems include coronary artery disease hyperlipidemia hypertension diabetes  A history of asthma and a history of an elevated PSA.   the patient denies chest pain shortness of breath PND orthopnea he states his blood sugars have been elevated recently he relates to his weight gain states his diet has been good he is compliant with his medications he denies any hypoglycemia.   Review of Systems  Constitutional: Positive for unexpected weight change. Negative for fever and fatigue.  HENT: Negative for hearing loss, congestion, neck pain and postnasal drip.   Eyes: Negative for discharge, redness and visual disturbance.  Respiratory: Negative for cough, shortness of breath and wheezing.   Cardiovascular: Negative for leg swelling.  Gastrointestinal: Negative for abdominal pain, constipation and abdominal distention.  Genitourinary: Negative for urgency and frequency.  Musculoskeletal: Negative for joint swelling and arthralgias.  Skin: Negative for color change and rash.  Neurological: Negative for weakness and light-headedness.  Hematological: Negative for adenopathy.  Psychiatric/Behavioral: Negative for behavioral problems.   Past Medical History  Diagnosis Date  . Anxiety   . HLD (hyperlipidemia)   . HTN (hypertension)   . History of heart attack   . Allergic rhinitis    Past Surgical History  Procedure Date  . 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 He does not have any smokeless tobacco history on file. He reports that he drinks alcohol. He reports that he does not use illicit drugs. family history includes Alzheimer's disease in his mother; Hyperlipidemia in his father; and Hypertension in his father and mother.  I have  reviewed this patient's past medical history surgical history social history current medication list and current allergy list and have documented appropriate changes as necessary        Objective:   Physical Exam  Constitutional: He is oriented to person, place, and time. He appears well-developed and well-nourished.        Morbidly obese white male  HENT:  Head: Normocephalic and atraumatic.        Small sebaceous cyst at the base of the neck  Eyes: Conjunctivae are normal. Pupils are equal, round, and reactive to light.  Neck: Normal range of motion. Neck supple.  Cardiovascular: Normal rate and regular rhythm.   Pulmonary/Chest: Effort normal and breath sounds normal.  Abdominal: Soft. Bowel sounds are normal.  Genitourinary: Rectum normal and prostate normal.        Slight hemorrhoidal tissue  Musculoskeletal: Normal range of motion.  Neurological: He is alert and oriented to person, place, and time. He has normal reflexes.  Skin: Skin is warm and dry.        Actinic keratoses on the forehead  Psychiatric: He has a normal mood and affect. Thought content normal.          Assessment & Plan:   Patient presents for yearly preventative medicine examination.   all immunizations and health maintenance protocols were reviewed with the patient and they are up to date with these protocols.   screening laboratory values were reviewed with the patient including screening of hyperlipidemia PSA renal function and hepatic function.   There medications past medical history social history problem list and allergies were reviewed in detail.  Goals were established with regard to weight loss exercise diet in compliance with medications

## 2010-09-29 NOTE — Assessment & Plan Note (Signed)
PSA remains stable at this time

## 2010-09-29 NOTE — Assessment & Plan Note (Signed)
Patient has elevated A1c and elevated fasting blood glucoses he has gained weight we discussed the relationship between weight and diabetic control and stated that the ultimate answer to him is to get his weight under control he may be up to get off all diabetic medications since this is most probably this metabolic syndrome or genetic syndrome.  In the interim we must change his diabetic medication  janumnet extended release formulation samples will be given and he'll be monitored in 2 months time

## 2010-09-29 NOTE — Assessment & Plan Note (Signed)
Blood pressure is well controlled on his current regimen he is compliant with his medications no changes are indicated patient metabolic panel reveals normal creatinine potassium

## 2010-09-29 NOTE — Patient Instructions (Signed)
For the seborrheic dermatitis on the legs get Cortaid 10 cream and Lotrimin AF cream. Mixed 50-50 apply to site twice a day until rash resolves.  4 travel to [place] there is no need for any immunizations noted take a trip can't since you can walk and any pharmacy in [place] and described your symptoms to a pharmacist and he can prescribe on the spot.

## 2010-09-29 NOTE — Assessment & Plan Note (Signed)
The patient is compliant with Crestor and his total cholesterol and LDL cholesterol at goal today show cholesterol remains low his the entire tolerant of taking niacin in the past weight loss and exercise are the primary desired intervention we have set goals for a 10 pound weight loss and increased activity.

## 2010-10-01 ENCOUNTER — Telehealth: Payer: Self-pay | Admitting: Internal Medicine

## 2010-10-01 DIAGNOSIS — E1165 Type 2 diabetes mellitus with hyperglycemia: Secondary | ICD-10-CM

## 2010-10-01 DIAGNOSIS — IMO0002 Reserved for concepts with insufficient information to code with codable children: Secondary | ICD-10-CM

## 2010-10-01 MED ORDER — SITAGLIPTIN PHOS-METFORMIN HCL 50-1000 MG PO TABS: 1.0000 | ORAL_TABLET | Freq: Two times a day (BID) | ORAL | Status: AC

## 2010-10-01 NOTE — Telephone Encounter (Signed)
Triage vm----pt saw Dr Lovell Sheehan on Tuesday. Pt is checking on status for a new diabetic rx to be called to [facility]. Please call pt to confirm.

## 2010-10-02 ENCOUNTER — Telehealth: Payer: Self-pay | Admitting: Internal Medicine

## 2010-10-02 NOTE — Telephone Encounter (Signed)
Called j anumet in to pharmacy -

## 2010-10-02 NOTE — Telephone Encounter (Signed)
Pt has question about Rx and wants to speak with a nurse about same.... Would like a return call to [phone] to discuss same.

## 2010-10-21 ENCOUNTER — Encounter: Payer: Self-pay | Admitting: Internal Medicine

## 2010-10-21 ENCOUNTER — Ambulatory Visit (INDEPENDENT_AMBULATORY_CARE_PROVIDER_SITE_OTHER): Payer: BC Managed Care – PPO | Admitting: Internal Medicine

## 2010-10-21 VITALS — BP 160/100 | HR 76 | Temp 99.4°F | Resp 14 | Ht 72.0 in | Wt 234.0 lb

## 2010-10-21 DIAGNOSIS — J329 Chronic sinusitis, unspecified: Secondary | ICD-10-CM

## 2010-10-21 DIAGNOSIS — I10 Essential (primary) hypertension: Secondary | ICD-10-CM

## 2010-10-21 MED ORDER — BISOPROLOL-HYDROCHLOROTHIAZIDE 10-6.25 MG PO TABS: 1.0000 | ORAL_TABLET | Freq: Every day | ORAL | Status: DC

## 2010-10-21 MED ORDER — AMOXICILLIN-POT CLAVULANATE 875-125 MG PO TABS: 1.0000 | ORAL_TABLET | Freq: Two times a day (BID) | ORAL | Status: AC

## 2010-10-21 NOTE — Assessment & Plan Note (Signed)
Elevations of blood pressure

## 2010-10-21 NOTE — Patient Instructions (Signed)
Halls Immune defense Zinc . Vit c and echinasea One every hour fro three days

## 2010-10-21 NOTE — Progress Notes (Signed)
Subjective:    Patient ID: Larry Garner, male    DOB: November 02, 1955, 55 y.o.   MRN: [id number]  HPI is a 55 year old white male who presents for an acute upper respiratory tract infection with nasal congestion headaches low-grade fever and blowing his nose with green discharge.  The symptoms have persisted for over a week during this period of time he has noted his blood pressure has gradually elevated he has not been taking any over-the-counter decongestants but he has consistently found his blood pressure to be in the 160-170 range with an elevated pulse his to mature was 99 0.4 today and physical examination was consistent with an acute bacterial infection    Review of Systems  Constitutional: Positive for fever and fatigue.  HENT: Positive for congestion, rhinorrhea, postnasal drip and sinus pressure.   Eyes: Positive for discharge and itching.  Respiratory: Positive for cough. Negative for wheezing and stridor.   Cardiovascular: Negative for chest pain, palpitations and leg swelling.  Gastrointestinal: Negative for abdominal pain and abdominal distention.  Genitourinary: Negative for dysuria and difficulty urinating.  Musculoskeletal: Positive for myalgias. Negative for back pain, joint swelling, arthralgias and gait problem.  Skin: Negative.   Neurological: Positive for headaches. Negative for dizziness.  Psychiatric/Behavioral: Negative.    Past Medical History  Diagnosis Date  . Anxiety   . HLD (hyperlipidemia)   . HTN (hypertension)   . History of heart attack   . Allergic rhinitis    Past Surgical History  Procedure Date  . 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 He does not have any smokeless tobacco history on file. He reports that he drinks alcohol. He reports that he does not use illicit drugs. family history includes Alzheimer's disease in his mother; Hyperlipidemia in his father; and Hypertension in his father and  mother. Allergies  Allergen Reactions  . Morphine Sulfate     REACTION: nausea       Objective:   Physical Exam      on physical examination he is a pleasant moderately obese white male in no apparent distress his blood pressure was 160/100 his pulse is 76 he was febrile with a temperature of 99 4 HEENT revealed pupils are equal round reactive to light and accommodation his turbinates are red and swollen with a white mucous discharge coming from the maxillary and frontal sinuses more prominent on the left there is tenderness over the maxillary and frontal sinuses on the left his oropharynx has posterior cobblestoning his neck was supple his lung fields were clear his heart exam revealed regular rate and rhythm without murmur his abdomen was soft his extremity exam revealed no cyanosis clubbing or edema   Assessment & Plan:   acute bump in his blood pressure may be multifactorial but given his history of coronary artery disease it would be prudent to increase this he had from 5-10 and monitor his blood pressure carefully over this period of time should he develop any chest pain and abnormal shortness of breath swelling in his extremities he will contact his off immediately it is after hours he will go to the urgent care.  the cutaneous edges we treated with Augmentin one twice daily for 14 days and we've recommended nasal saline lavage and reduce the Afrin as much as possible.   we've also recommended Halls immune defense cough drops.  Patient expressed understanding of the Necon Decadron gushes blood pressure responded quickly to the  increased beta blocker and Afrin nasal spray

## 2010-10-26 ENCOUNTER — Telehealth: Payer: Self-pay | Admitting: *Deleted

## 2010-10-26 MED ORDER — AMLODIPINE BESYLATE 5 MG PO TABS: 5.0000 mg | ORAL_TABLET | Freq: Every day | ORAL | Status: DC

## 2010-10-26 NOTE — Telephone Encounter (Signed)
ADD AMLODIPINE 5 MG QD PER DRJENKINS

## 2010-10-26 NOTE — Telephone Encounter (Addendum)
BP's running high 160/2 and 70's........any changes?

## 2010-11-20 ENCOUNTER — Other Ambulatory Visit (INDEPENDENT_AMBULATORY_CARE_PROVIDER_SITE_OTHER): Payer: BC Managed Care – PPO

## 2010-11-20 DIAGNOSIS — IMO0001 Reserved for inherently not codable concepts without codable children: Secondary | ICD-10-CM

## 2010-11-20 LAB — HEMOGLOBIN A1C: Hgb A1c MFr Bld: 7.6 % — ABNORMAL HIGH (ref 4.6–6.5)

## 2010-11-27 ENCOUNTER — Ambulatory Visit (INDEPENDENT_AMBULATORY_CARE_PROVIDER_SITE_OTHER): Payer: BC Managed Care – PPO | Admitting: Internal Medicine

## 2010-11-27 ENCOUNTER — Encounter: Payer: Self-pay | Admitting: Internal Medicine

## 2010-11-27 VITALS — BP 140/80 | HR 76 | Temp 98.2°F | Resp 14 | Ht 72.0 in | Wt 232.0 lb

## 2010-11-27 DIAGNOSIS — B070 Plantar wart: Secondary | ICD-10-CM

## 2010-11-27 DIAGNOSIS — E1165 Type 2 diabetes mellitus with hyperglycemia: Secondary | ICD-10-CM

## 2010-11-27 DIAGNOSIS — E1169 Type 2 diabetes mellitus with other specified complication: Secondary | ICD-10-CM

## 2010-11-27 DIAGNOSIS — I10 Essential (primary) hypertension: Secondary | ICD-10-CM

## 2010-11-27 DIAGNOSIS — IMO0002 Reserved for concepts with insufficient information to code with codable children: Secondary | ICD-10-CM

## 2010-11-27 NOTE — Assessment & Plan Note (Signed)
The patient has a small plantar wart on the lateral aspect of the bottom of his left foot. Informed consent was obtained in the lesion was treated for 60 seconds of liquid nitrogen application the patient tolerated the procedure well as procedural care was discussed with the patient and instructions should the lesion reappears contact our office immediately for destruction of single wart on left foot

## 2010-11-27 NOTE — Assessment & Plan Note (Signed)
The pt has been noncompliant with diet and weight loss.  The patient has had a pivotal response to seeing a friend who has had a heart attack in his ability to lose weight and its impact on his blood pressure and prognosis therefore I believe that he will be more adherent to diet and his medications and we will see over the next few months a significant weight loss

## 2010-11-27 NOTE — Progress Notes (Signed)
  Subjective:    Patient ID: Larry Garner, male    DOB: 14-Sep-1955, 55 y.o.   MRN: [id number]  HPI Patient is a 55 year old white male presents for followup of hypertension and diabetes an A1c obtained prior to today's visit showed a trend downward from 7.8 7.6 however we're not at goal we had a discussion of the impact of diet exercise and weight and have reached an agreement to pursue weight loss and exercise to achieve an A1c of 6-1/2 as our target goal The patient has an acute complaint of foot pain the patient has a small plantar wart on the lateral aspect of of his left foot.  He states that this is painful for him he walks on it   Review of Systems  Constitutional: Negative for fever and fatigue.  HENT: Negative for hearing loss, congestion, neck pain and postnasal drip.   Eyes: Negative for discharge, redness and visual disturbance.  Respiratory: Negative for cough, shortness of breath and wheezing.   Cardiovascular: Negative for leg swelling.  Gastrointestinal: Negative for abdominal pain, constipation and abdominal distention.  Genitourinary: Negative for urgency and frequency.  Musculoskeletal: Negative for joint swelling and arthralgias.  Skin: Negative for color change and rash.  Neurological: Negative for weakness and light-headedness.  Hematological: Negative for adenopathy.  Psychiatric/Behavioral: Negative for behavioral problems.       Objective:   Physical Exam  Nursing note and vitals reviewed. Blood pressure 140/80, pulse 76, temperature 98.2 F (36.8 C), temperature source Oral, resp. rate 14, height 6' (1.829 m), weight 232 lb (105.235 kg). Patient is an obese white male in no apparent distress pupils are equal round reactive to light and accommodation by observation neck was supple lung fields were clear heart examination with regular rate and rhythm extremity examination revealed no edema examination of his left foot reveals a plantar wart approximately 0.3 cm  in size on the lateral aspects of the plantar surface        Assessment & Plan:   Attention is well controlled on the current dose of beta blocker continue current medications diabetes is not well controlled and the primary intervention by agreement with the weight loss and exercise and continue the medication at the current dose should he not respond to the challenge to do weight loss and exercise the basilar insulin may be the next step in his therapy.  Patient's wart was treated with cryotherapy

## 2010-11-27 NOTE — Assessment & Plan Note (Signed)
Patient's blood pressure has responded to the increased dose of beta blocker however if he is successful in losing weight he may be able to titrate that medicine back down

## 2011-02-18 ENCOUNTER — Other Ambulatory Visit: Payer: Self-pay | Admitting: Internal Medicine

## 2011-02-21 ENCOUNTER — Other Ambulatory Visit: Payer: Self-pay | Admitting: Internal Medicine

## 2011-02-22 ENCOUNTER — Other Ambulatory Visit: Payer: Self-pay | Admitting: *Deleted

## 2011-02-22 MED ORDER — ALPRAZOLAM 0.5 MG PO TABS: 0.5000 mg | ORAL_TABLET | Freq: Three times a day (TID) | ORAL | Status: DC | PRN

## 2011-03-01 ENCOUNTER — Ambulatory Visit: Payer: BC Managed Care – PPO | Admitting: Internal Medicine

## 2011-03-25 ENCOUNTER — Ambulatory Visit (INDEPENDENT_AMBULATORY_CARE_PROVIDER_SITE_OTHER): Payer: BC Managed Care – PPO | Admitting: Internal Medicine

## 2011-03-25 ENCOUNTER — Encounter: Payer: Self-pay | Admitting: Internal Medicine

## 2011-03-25 DIAGNOSIS — E785 Hyperlipidemia, unspecified: Secondary | ICD-10-CM

## 2011-03-25 DIAGNOSIS — E1165 Type 2 diabetes mellitus with hyperglycemia: Secondary | ICD-10-CM

## 2011-03-25 DIAGNOSIS — I10 Essential (primary) hypertension: Secondary | ICD-10-CM

## 2011-03-25 DIAGNOSIS — E1169 Type 2 diabetes mellitus with other specified complication: Secondary | ICD-10-CM

## 2011-03-25 DIAGNOSIS — IMO0002 Reserved for concepts with insufficient information to code with codable children: Secondary | ICD-10-CM

## 2011-03-25 LAB — HEMOGLOBIN A1C: Hgb A1c MFr Bld: 7 % — ABNORMAL HIGH (ref 4.6–6.5)

## 2011-03-25 LAB — BASIC METABOLIC PANEL
Calcium: 9.1 mg/dL (ref 8.4–10.5)
GFR: 92.85 mL/min (ref >60.00)
Potassium: 3.6 mEq/L (ref 3.5–5.1)
Sodium: 133 mEq/L — ABNORMAL LOW (ref 135–145)

## 2011-03-25 NOTE — Progress Notes (Signed)
  Subjective:    Patient ID: Larry Garner, male    DOB: October 04, 1955, 55 y.o.   MRN: [id number]  HPI patient presents for followup of hypertension hyperlipidemia and recent worsening of his hypertension due to weight gain.  He has lost weight and exercising his blood pressures returned into a more normal range he is questioning whether or not he needs to stay on amlodipine at this point would recommend that you remain on a motor pain.  He states his blood sugars have been better controlled. He denies any chest pain shortness of breath PND orthopnea    Review of Systems  Constitutional: Negative for fever and fatigue.  HENT: Negative for hearing loss, congestion, neck pain and postnasal drip.   Eyes: Negative for discharge, redness and visual disturbance.  Respiratory: Negative for cough, shortness of breath and wheezing.   Cardiovascular: Negative for leg swelling.  Gastrointestinal: Negative for abdominal pain, constipation and abdominal distention.  Genitourinary: Negative for urgency and frequency.  Musculoskeletal: Negative for joint swelling and arthralgias.  Skin: Negative for color change and rash.  Neurological: Negative for weakness and light-headedness.  Hematological: Negative for adenopathy.  Psychiatric/Behavioral: Negative for behavioral problems.       Objective:   Physical Exam  Nursing note and vitals reviewed. Constitutional: He appears well-developed and well-nourished.  HENT:  Head: Normocephalic and atraumatic.  Eyes: Conjunctivae are normal. Pupils are equal, round, and reactive to light.  Neck: Normal range of motion. Neck supple.  Cardiovascular: Normal rate and regular rhythm.   Pulmonary/Chest: Effort normal and breath sounds normal.  Abdominal: Soft. Bowel sounds are normal.          Assessment & Plan:  Goal of 210 by the end of the year. We discussed weight loss goals we'll measure an A1c today to see how his diabetic control is responded to  the weight loss we will continue the amlodipine for his blood pressure for this time we will urgency continues to lose weight and hopefully we'll be able to wean off the amlodipine in the short-term future.  The the goal into the year is 200 pounds is a 25 pound weight loss.

## 2011-03-31 ENCOUNTER — Encounter: Payer: Self-pay | Admitting: Family Medicine

## 2011-03-31 ENCOUNTER — Ambulatory Visit (INDEPENDENT_AMBULATORY_CARE_PROVIDER_SITE_OTHER): Payer: BC Managed Care – PPO | Admitting: Family Medicine

## 2011-03-31 VITALS — BP 130/78 | HR 65 | Temp 98.3°F | Wt 232.0 lb

## 2011-03-31 DIAGNOSIS — J329 Chronic sinusitis, unspecified: Secondary | ICD-10-CM

## 2011-03-31 MED ORDER — METHYLPREDNISOLONE ACETATE 80 MG/ML IJ SUSP
120.0000 mg | Freq: Once | INTRAMUSCULAR | Status: AC
  Administered 2011-03-31: 120 mg via INTRAMUSCULAR

## 2011-03-31 MED ORDER — AZITHROMYCIN 250 MG PO TABS: ORAL_TABLET | ORAL | Status: AC

## 2011-03-31 NOTE — Progress Notes (Signed)
  Subjective:    Patient ID: Larry Garner, male    DOB: Jan 24, 1956, 54 y.o.   MRN: [id number]  HPI Here for 2 weeks of sinus pressure, HA, PND, and blowing green mucus from the nose. No fever or cough.    Review of Systems  Constitutional: Negative.   HENT: Positive for congestion, postnasal drip and sinus pressure.   Eyes: Negative.   Respiratory: Negative.        Objective:   Physical Exam  Constitutional: He appears well-developed and well-nourished.  HENT:  Right Ear: External ear normal.  Left Ear: External ear normal.  Nose: Nose normal.  Mouth/Throat: Oropharynx is clear and moist. No oropharyngeal exudate.  Eyes: Conjunctivae are normal. Pupils are equal, round, and reactive to light.  Neck: No thyromegaly present.  Pulmonary/Chest: Effort normal and breath sounds normal.  Lymphadenopathy:    He has no cervical adenopathy.          Assessment & Plan:  Add Mucinex D.

## 2011-04-05 ENCOUNTER — Other Ambulatory Visit: Payer: Self-pay | Admitting: *Deleted

## 2011-04-05 MED ORDER — ZALEPLON 10 MG PO CAPS: 10.0000 mg | ORAL_CAPSULE | Freq: Every evening | ORAL | Status: DC | PRN

## 2011-05-12 ENCOUNTER — Ambulatory Visit (INDEPENDENT_AMBULATORY_CARE_PROVIDER_SITE_OTHER): Payer: BC Managed Care – PPO

## 2011-05-12 DIAGNOSIS — Z23 Encounter for immunization: Secondary | ICD-10-CM

## 2011-06-14 ENCOUNTER — Telehealth: Payer: Self-pay | Admitting: *Deleted

## 2011-06-14 ENCOUNTER — Ambulatory Visit (INDEPENDENT_AMBULATORY_CARE_PROVIDER_SITE_OTHER): Payer: BC Managed Care – PPO | Admitting: Internal Medicine

## 2011-06-14 ENCOUNTER — Encounter: Payer: Self-pay | Admitting: Internal Medicine

## 2011-06-14 DIAGNOSIS — T23209A Burn of second degree of unspecified hand, unspecified site, initial encounter: Secondary | ICD-10-CM
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
## 2011-06-14 NOTE — Telephone Encounter (Signed)
Ov at 1:15-pt informed

## 2011-06-15 ENCOUNTER — Other Ambulatory Visit (INDEPENDENT_AMBULATORY_CARE_PROVIDER_SITE_OTHER): Payer: BC Managed Care – PPO

## 2011-06-15 DIAGNOSIS — E785 Hyperlipidemia, unspecified: Secondary | ICD-10-CM

## 2011-06-15 LAB — HEPATIC FUNCTION PANEL
ALT: 31 U/L (ref 0–53)
Alkaline Phosphatase: 52 U/L (ref 39–117)
Bilirubin, Direct: 0 mg/dL (ref 0.0–0.3)
Total Protein: 7.4 g/dL (ref 6.0–8.3)

## 2011-06-15 LAB — LIPID PANEL
Cholesterol: 110 mg/dL (ref 0–200)
VLDL: 26 mg/dL (ref 0.0–40.0)

## 2011-06-25 ENCOUNTER — Encounter: Payer: Self-pay | Admitting: Internal Medicine

## 2011-06-25 ENCOUNTER — Ambulatory Visit (INDEPENDENT_AMBULATORY_CARE_PROVIDER_SITE_OTHER): Payer: BC Managed Care – PPO | Admitting: Internal Medicine

## 2011-06-25 VITALS — BP 140/80 | HR 68 | Temp 98.5°F | Resp 16 | Ht 72.0 in | Wt 232.0 lb

## 2011-06-25 DIAGNOSIS — Z0000 Encounter for general adult medical examination without abnormal findings: Secondary | ICD-10-CM

## 2011-06-25 DIAGNOSIS — E785 Hyperlipidemia, unspecified: Secondary | ICD-10-CM

## 2011-06-25 DIAGNOSIS — IMO0002 Reserved for concepts with insufficient information to code with codable children: Secondary | ICD-10-CM

## 2011-06-25 DIAGNOSIS — E1169 Type 2 diabetes mellitus with other specified complication: Secondary | ICD-10-CM

## 2011-06-25 DIAGNOSIS — I10 Essential (primary) hypertension: Secondary | ICD-10-CM

## 2011-06-25 DIAGNOSIS — E1165 Type 2 diabetes mellitus with hyperglycemia: Secondary | ICD-10-CM

## 2011-06-25 NOTE — Patient Instructions (Signed)
The patient is instructed to continue all medications as prescribed. Schedule followup with check out clerk upon leaving the clinic  

## 2011-08-04 ENCOUNTER — Other Ambulatory Visit: Payer: Self-pay | Admitting: Internal Medicine

## 2011-08-23 ENCOUNTER — Other Ambulatory Visit: Payer: Self-pay | Admitting: *Deleted

## 2011-08-23 ENCOUNTER — Other Ambulatory Visit: Payer: Self-pay | Admitting: Internal Medicine

## 2011-08-23 MED ORDER — ALPRAZOLAM 0.5 MG PO TABS: 0.5000 mg | ORAL_TABLET | Freq: Three times a day (TID) | ORAL | Status: DC | PRN

## 2011-08-25 ENCOUNTER — Other Ambulatory Visit: Payer: Self-pay | Admitting: Internal Medicine

## 2011-09-02 ENCOUNTER — Ambulatory Visit (INDEPENDENT_AMBULATORY_CARE_PROVIDER_SITE_OTHER): Payer: BC Managed Care – PPO | Admitting: Family

## 2011-09-02 ENCOUNTER — Encounter: Payer: Self-pay | Admitting: Family

## 2011-09-02 VITALS — BP 116/78 | HR 64 | Temp 98.0°F | Resp 16 | Wt 235.0 lb

## 2011-09-02 DIAGNOSIS — D489 Neoplasm of uncertain behavior, unspecified: Secondary | ICD-10-CM

## 2011-09-02 DIAGNOSIS — J0100 Acute maxillary sinusitis, unspecified: Secondary | ICD-10-CM

## 2011-09-02 MED ORDER — AMOXICILLIN 500 MG PO TABS: 1000.0000 mg | ORAL_TABLET | Freq: Two times a day (BID) | ORAL | Status: AC

## 2011-09-02 NOTE — Progress Notes (Signed)
Subjective:    Patient ID: Larry Garner, male    DOB: June 22, 1956, 56 y.o.   MRN: [id number]  HPI 56 year old white male, nonsmoker, patient of Dr. Lovell Sheehan is in today with complaint of sneezing, cough, congestion, and fatigue that's been ongoing for 6 weeks. He's been taking over-the-counter Mucinex and Robitussin with little relief. He denies any fever or achiness.  Patient has a history of hypertension is well controlled, and tolerating medications well.   Review of Systems Past Medical History  Diagnosis Date  . Anxiety   . HLD (hyperlipidemia)   . HTN (hypertension)   . History of heart attack   . Allergic rhinitis     History   Social History  . Marital Status: Married    Spouse Name: N/A    Number of Children: N/A  . Years of Education: N/A   Occupational History  . Banker    Social History Main Topics  . Smoking status: Former Smoker    Quit date: 09/29/1977  . Smokeless tobacco: Never Used   Comment: qiot om 1980  . Alcohol Use: Yes  . Drug Use: No  . Sexually Active: Yes   Other Topics Concern  . Not on file   Social History Narrative  . No narrative on file    Past Surgical History  Procedure Date  . 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 Hypertension Mother   . Alzheimer's disease Mother   . Hyperlipidemia Father   . Hypertension Father     Allergies  Allergen Reactions  . Morphine Sulfate     REACTION: nausea    Current Outpatient Prescriptions on File Prior to Visit  Medication Sig Dispense Refill  . ALPRAZolam (XANAX) 0.5 MG tablet Take 1 tablet (0.5 mg total) by mouth 3 (three) times daily as needed.  30 tablet  5  . amLODipine (NORVASC) 5 MG tablet Take 1 tablet (5 mg total) by mouth daily.  30 tablet  11  . aspirin 325 MG tablet Take 325 mg by mouth daily.        . bisoprolol-hydrochlorothiazide (ZIAC) 10-6.25 MG per tablet Take 1 tablet by mouth daily.  30 tablet  11  .  budesonide-formoterol (SYMBICORT) 160-4.5 MCG/ACT inhaler Inhale 2 puffs into the lungs 2 (two) times daily.        . CRESTOR 20 MG tablet TAKE 1 TABLET BY MOUTH EVERY DAY  30 tablet  5  . cyclobenzaprine (FLEXERIL) 10 MG tablet Take 10 mg by mouth 3 (three) times daily as needed. Back pain       . diclofenac (CATAFLAM) 50 MG tablet Take 50 mg by mouth 2 (two) times daily as needed. For back pain       . famciclovir (FAMVIR) 500 MG tablet TAKE AS DIRECTED AND NEEDED  10 tablet  1  . folic acid (FOLVITE) 800 MCG tablet Take 1,600 mcg by mouth daily.        . Inositol Niacinate (NIACIN FLUSH FREE) 500 MG CAPS Take 1 capsule by mouth daily.        Marland Kitchen l-methylfolate-B6-B12 (METANX) 3-35-2 MG TABS TAKE 1 TABLET EVERY DAY  30 tablet  7  . losartan (COZAAR) 100 MG tablet TAKE 1 TABLET EVERY DAY  90 tablet  3  . Multiple Vitamin (MULTIVITAMIN) tablet Take 1 tablet by mouth daily.        . nitroGLYCERIN (NITROLINGUAL) 0.4 MG/SPRAY spray Place 1 spray  under the tongue every 5 (five) minutes as needed. As directed       . omega-3 acid ethyl esters (LOVAZA) 1 G capsule Take 2 g by mouth 2 (two) times daily.        . pimecrolimus (ELIDEL) 1 % cream Apply topically 2 (two) times daily as needed.        . Pyridoxine HCl (VITAMIN B-6) 500 MG tablet Take 500 mg by mouth daily.        . sitaGLIPtan-metformin (JANUMET) 50-1000 MG per tablet Take 1 tablet by mouth 2 (two) times daily with meals.  60 tablet  11  . tadalafil (CIALIS) 20 MG tablet Take 20 mg by mouth as directed.        . zaleplon (SONATA) 10 MG capsule Take 1 capsule (10 mg total) by mouth at bedtime as needed.  30 capsule  5    BP 116/78  Pulse 64  Temp(Src) 98 F (36.7 C) (Oral)  Resp 16  Wt 235 lb (106.595 kg)chart    Objective:   Physical Exam  Constitutional: He is oriented to person, place, and time. He appears well-developed and well-nourished.  HENT:  Right Ear: External ear normal.  Left Ear: External ear normal.  Nose: Nose  normal.  Mouth/Throat: Oropharynx is clear and moist.  Neck: Normal range of motion. Neck supple.  Cardiovascular: Normal rate, regular rhythm and normal heart sounds.   Pulmonary/Chest: Effort normal and breath sounds normal.  Musculoskeletal: Normal range of motion.  Neurological: He is alert and oriented to person, place, and time.  Skin:       Flesh tone, papular lesion noted to the left face, under the left eye approximately 4 mm in diameter, appears like a basal cell carcinoma.          Assessment & Plan:  Assessment: Acute sinusitis, hypertension  Plan: Amoxicillin 500 mg 2 capsules by mouth twice a day x10 days. Continue Mucinex and Robitussin when necessary. Patient to call the office if symptoms worsen or persist. Recheck as scheduled and when necessary. As of note, patient has an appointment with Dr. Lovell Sheehan for complete physical exam in 2 weeks, I have advised him to let Dr. Lovell Sheehan will a lesion on his left face adjacent to his nose. Consider removal of the lesion ASAP.

## 2011-09-02 NOTE — Patient Instructions (Signed)

## 2011-09-08 ENCOUNTER — Other Ambulatory Visit: Payer: Self-pay | Admitting: Internal Medicine

## 2011-09-24 ENCOUNTER — Other Ambulatory Visit: Payer: BC Managed Care – PPO

## 2011-09-27 ENCOUNTER — Other Ambulatory Visit (INDEPENDENT_AMBULATORY_CARE_PROVIDER_SITE_OTHER): Payer: BC Managed Care – PPO

## 2011-09-27 DIAGNOSIS — Z0000 Encounter for general adult medical examination without abnormal findings: Secondary | ICD-10-CM

## 2011-09-27 LAB — LIPID PANEL
Cholesterol: 97 mg/dL (ref 0–200)
HDL: 31.6 mg/dL — ABNORMAL LOW (ref >39.00)
VLDL: 19.6 mg/dL (ref 0.0–40.0)

## 2011-09-27 LAB — BASIC METABOLIC PANEL
BUN: 13 mg/dL (ref 6–23)
Calcium: 9.2 mg/dL (ref 8.4–10.5)
GFR: 89.24 mL/min (ref >60.00)
Glucose, Bld: 198 mg/dL — ABNORMAL HIGH (ref 70–99)
Sodium: 138 mEq/L (ref 135–145)

## 2011-09-27 LAB — CBC WITH DIFFERENTIAL/PLATELET
Eosinophils Absolute: 0.3 10*3/uL (ref 0.0–0.7)
MCHC: 34 g/dL (ref 30.0–36.0)
MCV: 91.4 fl (ref 78.0–100.0)
Monocytes Absolute: 0.7 10*3/uL (ref 0.1–1.0)
Neutrophils Relative %: 70.7 % (ref 43.0–77.0)
Platelets: 259 10*3/uL (ref 150.0–400.0)
RDW: 12.9 % (ref 11.5–14.6)

## 2011-09-27 LAB — HEPATIC FUNCTION PANEL: Albumin: 3.9 g/dL (ref 3.5–5.2)

## 2011-10-01 ENCOUNTER — Encounter: Payer: Self-pay | Admitting: Internal Medicine

## 2011-10-01 ENCOUNTER — Ambulatory Visit (INDEPENDENT_AMBULATORY_CARE_PROVIDER_SITE_OTHER): Payer: BC Managed Care – PPO | Admitting: Internal Medicine

## 2011-10-01 VITALS — BP 110/78 | HR 72 | Temp 98.2°F | Resp 16 | Ht 72.0 in | Wt 228.0 lb

## 2011-10-01 DIAGNOSIS — Z0000 Encounter for general adult medical examination without abnormal findings: Secondary | ICD-10-CM

## 2011-10-01 DIAGNOSIS — IMO0001 Reserved for inherently not codable concepts without codable children: Secondary | ICD-10-CM

## 2011-10-01 MED ORDER — LINAGLIPTIN-METFORMIN HCL 2.5-1000 MG PO TABS: 1.0000 | ORAL_TABLET | Freq: Two times a day (BID) | ORAL | Status: DC

## 2011-10-01 NOTE — Progress Notes (Signed)
Subjective:    Patient ID: Larry Garner, male    DOB: [birth year], 56 y.o.   MRN: [id number]  HPI  CPX Weigh and DM discussed CAD stable Lipids at goal  Review of Systems  Constitutional: Negative for fever and fatigue.  HENT: Negative for hearing loss, congestion, neck pain and postnasal drip.   Eyes: Negative for discharge, redness and visual disturbance.  Respiratory: Negative for cough, shortness of breath and wheezing.   Cardiovascular: Negative for leg swelling.  Gastrointestinal: Negative for abdominal pain, constipation and abdominal distention.  Genitourinary: Negative for urgency and frequency.  Musculoskeletal: Negative for joint swelling and arthralgias.  Skin: Negative for color change and rash.  Neurological: Negative for weakness and light-headedness.  Hematological: Negative for adenopathy.  Psychiatric/Behavioral: Negative for behavioral problems.   Past Medical History  Diagnosis Date  . Anxiety   . HLD (hyperlipidemia)   . HTN (hypertension)   . History of heart attack   . Allergic rhinitis     History   Social History  . Marital Status: Married    Spouse Name: N/A    Number of Children: N/A  . Years of Education: N/A   Occupational History  . Banker    Social History Main Topics  . Smoking status: Former Smoker    Quit date: 09/29/1977  . Smokeless tobacco: Never Used   Comment: qiot om 1980  . Alcohol Use: Yes  . Drug Use: No  . Sexually Active: Yes   Other Topics Concern  . Not on file   Social History Narrative  . No narrative on file    Past Surgical History  Procedure Date  . 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 Hypertension Mother   . Alzheimer's disease Mother   . Hyperlipidemia Father   . Hypertension Father     Allergies  Allergen Reactions  . Morphine Sulfate     REACTION: nausea    Current Outpatient Prescriptions on File Prior to Visit  Medication Sig  Dispense Refill  . ALPRAZolam (XANAX) 0.5 MG tablet Take 1 tablet (0.5 mg total) by mouth 3 (three) times daily as needed.  30 tablet  5  . amLODipine (NORVASC) 5 MG tablet Take 1 tablet (5 mg total) by mouth daily.  30 tablet  11  . aspirin 325 MG tablet Take 325 mg by mouth daily.        . bisoprolol-hydrochlorothiazide (ZIAC) 10-6.25 MG per tablet Take 1 tablet by mouth daily.  30 tablet  11  . budesonide-formoterol (SYMBICORT) 160-4.5 MCG/ACT inhaler Inhale 2 puffs into the lungs 2 (two) times daily.        . CRESTOR 20 MG tablet TAKE 1 TABLET BY MOUTH EVERY DAY  30 tablet  5  . cyclobenzaprine (FLEXERIL) 10 MG tablet Take 10 mg by mouth 3 (three) times daily as needed. Back pain       . diclofenac (CATAFLAM) 50 MG tablet Take 50 mg by mouth 2 (two) times daily as needed. For back pain       . famciclovir (FAMVIR) 500 MG tablet TAKE AS DIRECTED AND NEEDED  10 tablet  1  . folic acid (FOLVITE) 800 MCG tablet Take 1,600 mcg by mouth daily.        . Inositol Niacinate (NIACIN FLUSH FREE) 500 MG CAPS Take 1 capsule by mouth daily.        Marland Kitchen l-methylfolate-B6-B12 (METANX) 3-35-2 MG TABS TAKE  1 TABLET EVERY DAY  30 tablet  7  . losartan (COZAAR) 100 MG tablet TAKE 1 TABLET EVERY DAY  90 tablet  3  . LOVAZA 1 G capsule TAKE 1 CAPSULE BY MOUTH 3 TIMES A DAY  90 capsule  5  . Multiple Vitamin (MULTIVITAMIN) tablet Take 1 tablet by mouth daily.        . nitroGLYCERIN (NITROLINGUAL) 0.4 MG/SPRAY spray Place 1 spray under the tongue every 5 (five) minutes as needed. As directed       . pimecrolimus (ELIDEL) 1 % cream Apply topically 2 (two) times daily as needed.        . Pyridoxine HCl (VITAMIN B-6) 500 MG tablet Take 500 mg by mouth daily.        . sitaGLIPtan-metformin (JANUMET) 50-1000 MG per tablet Take 1 tablet by mouth 2 (two) times daily with meals.  60 tablet  11  . tadalafil (CIALIS) 20 MG tablet Take 20 mg by mouth as directed.        . zaleplon (SONATA) 10 MG capsule Take 1 capsule (10 mg  total) by mouth at bedtime as needed.  30 capsule  5    BP 110/78  Pulse 72  Temp 98.2 F (36.8 C)  Resp 16  Ht 6' (1.829 m)  Wt 228 lb (103.42 kg)  BMI 30.92 kg/m2        Objective:   Physical Exam  Nursing note and vitals reviewed. Constitutional: He is oriented to person, place, and time. He appears well-developed and well-nourished.  HENT:  Head: Normocephalic and atraumatic.  Eyes: Conjunctivae are normal. Pupils are equal, round, and reactive to light.  Neck: Normal range of motion. Neck supple.  Cardiovascular: Normal rate and regular rhythm.   Pulmonary/Chest: Effort normal and breath sounds normal.  Abdominal: Soft. Bowel sounds are normal.  Genitourinary: Rectum normal and prostate normal.  Musculoskeletal: Normal range of motion.  Neurological: He is alert and oriented to person, place, and time.  Skin:       AK on the left cheek  Psychiatric: He has a normal mood and affect. His behavior is normal.          Assessment & Plan:   Patient presents for yearly preventative medicine examination.   all immunizations and health maintenance protocols were reviewed with the patient and they are up to date with these protocols.   screening laboratory values were reviewed with the patient including screening of hyperlipidemia PSA renal function and hepatic function.   There medications past medical history social history problem list and allergies were reviewed in detail.   Goals were established with regard to weight loss exercise diet in compliance with medications  Worsening DM with fasting CBG in the 150-200 range

## 2011-10-01 NOTE — Patient Instructions (Signed)
The patient is instructed to continue all medications as prescribed. Schedule followup with check out clerk upon leaving the clinic  

## 2011-10-19 ENCOUNTER — Other Ambulatory Visit: Payer: Self-pay | Admitting: *Deleted

## 2011-10-19 MED ORDER — ZALEPLON 10 MG PO CAPS: 10.0000 mg | ORAL_CAPSULE | Freq: Every evening | ORAL | Status: DC | PRN

## 2011-10-23 ENCOUNTER — Other Ambulatory Visit: Payer: Self-pay | Admitting: Internal Medicine

## 2011-11-09 NOTE — Progress Notes (Signed)
  Subjective:    Patient ID: Larry Garner, male    DOB: 1955/11/26, 56 y.o.   MRN: [id number]  HPI  Patient is a 56 year old male with a history of coronary disease who presents for lipid and liver monitoring. He has been stable on his current medications for lipid control and is compliant with his medications  He also has type 2 diabetes difficult to control secondary to diet and weight gain  Review of Systems  Constitutional: Negative for fever and fatigue.  HENT: Negative for hearing loss, congestion, neck pain and postnasal drip.   Eyes: Negative for discharge, redness and visual disturbance.  Respiratory: Negative for cough, shortness of breath and wheezing.   Cardiovascular: Negative for leg swelling.  Gastrointestinal: Negative for abdominal pain, constipation and abdominal distention.  Genitourinary: Negative for urgency and frequency.  Musculoskeletal: Negative for joint swelling and arthralgias.  Skin: Negative for color change and rash.  Neurological: Negative for weakness and light-headedness.  Hematological: Negative for adenopathy.  Psychiatric/Behavioral: Negative for behavioral problems.       Objective:   Physical Exam  Nursing note reviewed. Constitutional: He appears well-developed and well-nourished.  HENT:  Head: Normocephalic and atraumatic.  Eyes: Conjunctivae are normal. Pupils are equal, round, and reactive to light.  Neck: Normal range of motion. Neck supple.  Cardiovascular: Normal rate and regular rhythm.   Pulmonary/Chest: Effort normal and breath sounds normal.  Abdominal: Soft. Bowel sounds are normal.          Assessment & Plan:  Lipid values reviewed with patient he is at goal for his total HDL LDL and triglycerides at this time continue compliance with medication and urged

## 2011-11-24 ENCOUNTER — Other Ambulatory Visit: Payer: Self-pay | Admitting: Internal Medicine

## 2011-12-21 ENCOUNTER — Other Ambulatory Visit: Payer: BC Managed Care – PPO

## 2011-12-29 ENCOUNTER — Ambulatory Visit: Payer: BC Managed Care – PPO | Admitting: Internal Medicine

## 2011-12-31 ENCOUNTER — Other Ambulatory Visit (INDEPENDENT_AMBULATORY_CARE_PROVIDER_SITE_OTHER): Payer: BC Managed Care – PPO

## 2011-12-31 DIAGNOSIS — IMO0001 Reserved for inherently not codable concepts without codable children: Secondary | ICD-10-CM

## 2012-01-07 ENCOUNTER — Ambulatory Visit (INDEPENDENT_AMBULATORY_CARE_PROVIDER_SITE_OTHER): Payer: BC Managed Care – PPO | Admitting: Internal Medicine

## 2012-01-07 ENCOUNTER — Encounter: Payer: Self-pay | Admitting: Internal Medicine

## 2012-01-07 VITALS — BP 140/84 | HR 72 | Temp 98.2°F | Resp 16 | Ht 72.0 in | Wt 227.0 lb

## 2012-01-07 DIAGNOSIS — E1169 Type 2 diabetes mellitus with other specified complication: Secondary | ICD-10-CM

## 2012-01-07 DIAGNOSIS — E1165 Type 2 diabetes mellitus with hyperglycemia: Secondary | ICD-10-CM

## 2012-01-07 DIAGNOSIS — IMO0002 Reserved for concepts with insufficient information to code with codable children: Secondary | ICD-10-CM

## 2012-01-07 DIAGNOSIS — E785 Hyperlipidemia, unspecified: Secondary | ICD-10-CM

## 2012-01-07 NOTE — Progress Notes (Signed)
Subjective:    Patient ID: Larry Garner, male    DOB: 07-13-1956, 56 y.o.   MRN: [id number]  HPI  follow up a1c labs and diet with hx of DM and obesity in context of CAD and HTN   Review of Systems  Constitutional: Negative for fever and fatigue.  HENT: Negative for hearing loss, congestion, neck pain and postnasal drip.   Eyes: Negative for discharge, redness and visual disturbance.  Respiratory: Negative for cough, shortness of breath and wheezing.   Cardiovascular: Negative for leg swelling.  Gastrointestinal: Negative for abdominal pain, constipation and abdominal distention.  Genitourinary: Negative for urgency and frequency.  Musculoskeletal: Negative for joint swelling and arthralgias.  Skin: Negative for color change and rash.  Neurological: Negative for weakness and light-headedness.  Hematological: Negative for adenopathy.  Psychiatric/Behavioral: Negative for behavioral problems.   Past Medical History  Diagnosis Date  . Anxiety   . HLD (hyperlipidemia)   . HTN (hypertension)   . History of heart attack   . Allergic rhinitis     History   Social History  . Marital Status: Married    Spouse Name: N/A    Number of Children: N/A  . Years of Education: N/A   Occupational History  . Banker    Social History Main Topics  . Smoking status: Former Smoker    Quit date: 09/29/1977  . Smokeless tobacco: Never Used   Comment: qiot om 1980  . Alcohol Use: Yes  . Drug Use: No  . Sexually Active: Yes   Other Topics Concern  . Not on file   Social History Narrative  . No narrative on file    Past Surgical History  Procedure Date  . 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 Hypertension Mother   . Alzheimer's disease Mother   . Hyperlipidemia Father   . Hypertension Father     Allergies  Allergen Reactions  . Morphine Sulfate     REACTION: nausea    Current Outpatient Prescriptions on File Prior to  Visit  Medication Sig Dispense Refill  . ALPRAZolam (XANAX) 0.5 MG tablet Take 1 tablet (0.5 mg total) by mouth 3 (three) times daily as needed.  30 tablet  5  . amLODipine (NORVASC) 5 MG tablet TAKE 1 TABLET (5 MG TOTAL) BY MOUTH DAILY.  30 tablet  11  . aspirin 325 MG tablet Take 325 mg by mouth daily.        . bisoprolol-hydrochlorothiazide (ZIAC) 10-6.25 MG per tablet TAKE 1 TABLET BY MOUTH EVERY DAY  30 tablet  11  . budesonide-formoterol (SYMBICORT) 160-4.5 MCG/ACT inhaler Inhale 2 puffs into the lungs 2 (two) times daily.        . CRESTOR 20 MG tablet TAKE 1 TABLET BY MOUTH EVERY DAY  30 tablet  5  . cyclobenzaprine (FLEXERIL) 10 MG tablet Take 10 mg by mouth 3 (three) times daily as needed. Back pain       . diclofenac (CATAFLAM) 50 MG tablet Take 50 mg by mouth 2 (two) times daily as needed. For back pain       . famciclovir (FAMVIR) 500 MG tablet TAKE AS DIRECTED AND NEEDED  10 tablet  1  . folic acid (FOLVITE) 800 MCG tablet Take 1,600 mcg by mouth daily.        . Inositol Niacinate (NIACIN FLUSH FREE) 500 MG CAPS Take 1 capsule by mouth daily.        Marland Kitchen  l-methylfolate-B6-B12 (METANX) 3-35-2 MG TABS TAKE 1 TABLET EVERY DAY  30 tablet  7  . Linagliptin-Metformin HCl (JENTADUETO) 2.12-998 MG TABS Take 1 tablet by mouth 2 (two) times daily.  60 tablet  2  . losartan (COZAAR) 100 MG tablet TAKE 1 TABLET EVERY DAY  90 tablet  3  . LOVAZA 1 G capsule TAKE 1 CAPSULE BY MOUTH 3 TIMES A DAY  90 capsule  5  . Multiple Vitamin (MULTIVITAMIN) tablet Take 1 tablet by mouth daily.        . nitroGLYCERIN (NITROLINGUAL) 0.4 MG/SPRAY spray Place 1 spray under the tongue every 5 (five) minutes as needed. As directed       . pimecrolimus (ELIDEL) 1 % cream Apply topically 2 (two) times daily as needed.        . Pyridoxine HCl (VITAMIN B-6) 500 MG tablet Take 500 mg by mouth daily.        . tadalafil (CIALIS) 20 MG tablet Take 20 mg by mouth as directed.        . zaleplon (SONATA) 10 MG capsule Take 1  capsule (10 mg total) by mouth at bedtime as needed.  30 capsule  5    BP 140/84  Pulse 72  Temp 98.2 F (36.8 C)  Resp 16  Ht 6' (1.829 m)  Wt 227 lb (102.967 kg)  BMI 30.79 kg/m2       Objective:   Physical Exam  Nursing note and vitals reviewed. Constitutional: He appears well-developed and well-nourished.  HENT:  Head: Normocephalic and atraumatic.  Eyes: Conjunctivae are normal. Pupils are equal, round, and reactive to light.  Neck: Normal range of motion. Neck supple.  Cardiovascular: Normal rate and regular rhythm.   Pulmonary/Chest: Effort normal and breath sounds normal.  Abdominal: Soft. Bowel sounds are normal.          Assessment & Plan:  Reviewed DM and HTN Has not lost weight A1c is still high disucssed diet in detail  I have spent more than 30 minutes examining this patient face-to-face of which over half was spent in counseling

## 2012-01-07 NOTE — Patient Instructions (Signed)
Practical paleo diet

## 2012-01-20 ENCOUNTER — Other Ambulatory Visit: Payer: Self-pay | Admitting: *Deleted

## 2012-01-20 MED ORDER — ALPRAZOLAM 0.5 MG PO TABS: 0.5000 mg | ORAL_TABLET | Freq: Three times a day (TID) | ORAL | Status: DC | PRN

## 2012-02-23 ENCOUNTER — Other Ambulatory Visit: Payer: Self-pay | Admitting: Internal Medicine

## 2012-03-21 ENCOUNTER — Other Ambulatory Visit: Payer: Self-pay | Admitting: Internal Medicine

## 2012-03-24 ENCOUNTER — Other Ambulatory Visit: Payer: Self-pay | Admitting: Internal Medicine

## 2012-04-04 ENCOUNTER — Other Ambulatory Visit: Payer: Self-pay | Admitting: Internal Medicine

## 2012-04-17 ENCOUNTER — Other Ambulatory Visit (INDEPENDENT_AMBULATORY_CARE_PROVIDER_SITE_OTHER): Payer: BC Managed Care – PPO

## 2012-04-17 ENCOUNTER — Ambulatory Visit: Payer: BC Managed Care – PPO

## 2012-04-17 DIAGNOSIS — E119 Type 2 diabetes mellitus without complications: Secondary | ICD-10-CM

## 2012-04-17 DIAGNOSIS — E1165 Type 2 diabetes mellitus with hyperglycemia: Secondary | ICD-10-CM

## 2012-04-17 DIAGNOSIS — IMO0002 Reserved for concepts with insufficient information to code with codable children: Secondary | ICD-10-CM

## 2012-04-17 DIAGNOSIS — E785 Hyperlipidemia, unspecified: Secondary | ICD-10-CM

## 2012-04-17 LAB — HEPATIC FUNCTION PANEL
Bilirubin, Direct: 0.1 mg/dL (ref 0.0–0.3)
Total Bilirubin: 0.4 mg/dL (ref 0.3–1.2)
Total Protein: 7.3 g/dL (ref 6.0–8.3)

## 2012-04-17 LAB — LIPID PANEL
LDL Cholesterol: 53 mg/dL (ref 0–99)
VLDL: 21 mg/dL (ref 0.0–40.0)

## 2012-04-17 LAB — HEMOGLOBIN A1C
Hgb A1c MFr Bld: 6.8 % — ABNORMAL HIGH (ref <5.7)
Mean Plasma Glucose: 148 mg/dL — ABNORMAL HIGH (ref <117)

## 2012-04-20 ENCOUNTER — Other Ambulatory Visit: Payer: Self-pay | Admitting: Internal Medicine

## 2012-04-24 ENCOUNTER — Ambulatory Visit: Payer: BC Managed Care – PPO | Admitting: Internal Medicine

## 2012-04-25 ENCOUNTER — Ambulatory Visit (INDEPENDENT_AMBULATORY_CARE_PROVIDER_SITE_OTHER): Payer: BC Managed Care – PPO | Admitting: Internal Medicine

## 2012-04-25 VITALS — BP 130/80 | HR 76 | Temp 98.2°F | Resp 16 | Ht 72.0 in | Wt 232.0 lb

## 2012-04-25 DIAGNOSIS — Z23 Encounter for immunization: Secondary | ICD-10-CM

## 2012-04-25 DIAGNOSIS — B9689 Other specified bacterial agents as the cause of diseases classified elsewhere: Secondary | ICD-10-CM

## 2012-04-25 DIAGNOSIS — J209 Acute bronchitis, unspecified: Secondary | ICD-10-CM

## 2012-04-25 DIAGNOSIS — J309 Allergic rhinitis, unspecified: Secondary | ICD-10-CM

## 2012-04-25 DIAGNOSIS — E785 Hyperlipidemia, unspecified: Secondary | ICD-10-CM

## 2012-04-25 DIAGNOSIS — E669 Obesity, unspecified: Secondary | ICD-10-CM

## 2012-04-25 MED ORDER — LEVOFLOXACIN 500 MG PO TABS: 500.0000 mg | ORAL_TABLET | Freq: Every day | ORAL | Status: DC

## 2012-04-25 MED ORDER — AZELASTINE-FLUTICASONE 137-50 MCG/ACT NA SUSP: 1.0000 | Freq: Two times a day (BID) | NASAL | Status: DC

## 2012-04-25 NOTE — Patient Instructions (Signed)
The patient is instructed to continue all medications as prescribed. Schedule followup with check out clerk upon leaving the clinic  

## 2012-04-25 NOTE — Progress Notes (Signed)
Subjective:    Patient ID: Larry Garner, male    DOB: 1955/12/22, 56 y.o.   MRN: [id number]  HPI Acute visit for bronchitis and cough Congested,  Non productive cough. Blood pressure stable Reviewed the lipid, and a1vc results CBG's stable improved   Review of Systems  Constitutional: Negative for fever and fatigue.  HENT: Negative for hearing loss, congestion, neck pain and postnasal drip.   Eyes: Negative for discharge, redness and visual disturbance.  Respiratory: Negative for cough, shortness of breath and wheezing.   Cardiovascular: Negative for leg swelling.  Gastrointestinal: Negative for abdominal pain, constipation and abdominal distention.  Genitourinary: Negative for urgency and frequency.  Musculoskeletal: Negative for joint swelling and arthralgias.  Skin: Negative for color change and rash.  Neurological: Negative for weakness and light-headedness.  Hematological: Negative for adenopathy.  Psychiatric/Behavioral: Negative for behavioral problems.   Past Medical History  Diagnosis Date  . Anxiety   . HLD (hyperlipidemia)   . HTN (hypertension)   . History of heart attack   . Allergic rhinitis     History   Social History  . Marital Status: Married    Spouse Name: N/A    Number of Children: N/A  . Years of Education: N/A   Occupational History  . Banker    Social History Main Topics  . Smoking status: Former Smoker    Quit date: 09/29/1977  . Smokeless tobacco: Never Used   Comment: qiot om 1980  . Alcohol Use: Yes  . Drug Use: No  . Sexually Active: Yes   Other Topics Concern  . Not on file   Social History Narrative  . No narrative on file    Past Surgical History  Procedure Date  . 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 Hypertension Mother   . Alzheimer's disease Mother   . Hyperlipidemia Father   . Hypertension Father     Allergies  Allergen Reactions  . Morphine Sulfate       REACTION: nausea    Current Outpatient Prescriptions on File Prior to Visit  Medication Sig Dispense Refill  . ALPRAZolam (XANAX) 0.5 MG tablet Take 1 tablet (0.5 mg total) by mouth 3 (three) times daily as needed.  30 tablet  5  . amLODipine (NORVASC) 5 MG tablet TAKE 1 TABLET (5 MG TOTAL) BY MOUTH DAILY.  30 tablet  11  . aspirin 325 MG tablet Take 325 mg by mouth daily.        . bisoprolol-hydrochlorothiazide (ZIAC) 10-6.25 MG per tablet TAKE 1 TABLET BY MOUTH EVERY DAY  30 tablet  11  . CRESTOR 20 MG tablet TAKE 1 TABLET BY MOUTH EVERY DAY  30 tablet  5  . cyclobenzaprine (FLEXERIL) 10 MG tablet Take 10 mg by mouth 3 (three) times daily as needed. Back pain       . diclofenac (CATAFLAM) 50 MG tablet Take 50 mg by mouth 2 (two) times daily as needed. For back pain       . famciclovir (FAMVIR) 500 MG tablet TAKE AS DIRECTED AND NEEDED  10 tablet  1  . folic acid (FOLVITE) 800 MCG tablet Take 1,600 mcg by mouth daily.        . Inositol Niacinate (NIACIN FLUSH FREE) 500 MG CAPS Take 1 capsule by mouth daily.        . JENTADUETO 2.12-998 MG TABS TAKE 1 TABLET BY MOUTH TWICE A DAY  60  tablet  2  . l-methylfolate-B6-B12 (METANX) 3-35-2 MG TABS TAKE 1 TABLET EVERY DAY  30 tablet  7  . losartan (COZAAR) 100 MG tablet TAKE 1 TABLET EVERY DAY  90 tablet  3  . LOVAZA 1 G capsule TAKE 1 CAPSULE BY MOUTH 3 TIMES A DAY  90 capsule  5  . Multiple Vitamin (MULTIVITAMIN) tablet Take 1 tablet by mouth daily.        . nitroGLYCERIN (NITROLINGUAL) 0.4 MG/SPRAY spray USE AS DIRECTED  30 g  2  . pimecrolimus (ELIDEL) 1 % cream Apply topically 2 (two) times daily as needed.        . Pyridoxine HCl (VITAMIN B-6) 500 MG tablet Take 500 mg by mouth daily.        . SYMBICORT 160-4.5 MCG/ACT inhaler USE 2 PUFFS TWICE A DAY  10.2 g  6  . tadalafil (CIALIS) 20 MG tablet Take 20 mg by mouth as directed.        . zaleplon (SONATA) 10 MG capsule Take 1 capsule (10 mg total) by mouth at bedtime as needed.  30 capsule   5    BP 130/80  Pulse 76  Temp 98.2 F (36.8 C)  Resp 16  Ht 6' (1.829 m)  Wt 232 lb (105.235 kg)  BMI 31.47 kg/m2        Objective:   Physical Exam  Nursing note and vitals reviewed. Constitutional: He appears well-developed and well-nourished.  HENT:  Head: Normocephalic and atraumatic.  Eyes: Conjunctivae normal are normal. Pupils are equal, round, and reactive to light.  Neck: Normal range of motion. Neck supple.  Cardiovascular: Normal rate and regular rhythm.   Pulmonary/Chest: Effort normal and breath sounds normal.  Abdominal: Soft. Bowel sounds are normal.          Assessment & Plan:  Patient's weight has not declined despite exercise and he has a poor self-realization I was need to follow a tighter diet.  Weight loss in the past has related to better lipid and better diabetic control.  We discussed his medications he asserts that he will try harder to follow a diet we reviewed different diet options and he states that he would rather go with Weight Watchers as his choice  I have spent more than 30 minutes examining this patient face-to-face of which over half was spent in counseling about weight loss and comorbid disease  He also had complaint of upper respiratory tract infection or acute bronchitis for which we will treat with a Z-Pak and recommend Mucinex

## 2012-04-27 ENCOUNTER — Other Ambulatory Visit: Payer: Self-pay | Admitting: Internal Medicine

## 2012-05-01 ENCOUNTER — Encounter: Payer: Self-pay | Admitting: Internal Medicine

## 2012-05-03 ENCOUNTER — Telehealth: Payer: Self-pay | Admitting: Family Medicine

## 2012-05-03 NOTE — Telephone Encounter (Signed)
[facility] Prior Auth will not be approved until patient tries at least one: flunisolide (Nasarel), fluticasone (Flonase), mometasone furoate (Nasonex), or triamcinolone acetonide (Nasacort). Thank you.

## 2012-05-04 NOTE — Telephone Encounter (Signed)
Ok thank you. Will note on PA and submit.

## 2012-05-04 NOTE — Telephone Encounter (Signed)
Talked with pt and he has already tried > 3 months of Flonase and nasonex.  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

## 2012-05-24 ENCOUNTER — Other Ambulatory Visit: Payer: Self-pay | Admitting: Internal Medicine

## 2012-07-11 ENCOUNTER — Ambulatory Visit (INDEPENDENT_AMBULATORY_CARE_PROVIDER_SITE_OTHER): Payer: BC Managed Care – PPO | Admitting: Family Medicine

## 2012-07-11 VITALS — BP 122/80 | HR 70 | Temp 99.1°F | Wt 236.0 lb

## 2012-07-11 DIAGNOSIS — J069 Acute upper respiratory infection, unspecified: Secondary | ICD-10-CM

## 2012-07-11 DIAGNOSIS — J988 Other specified respiratory disorders: Secondary | ICD-10-CM

## 2012-07-11 MED ORDER — FLUTICASONE PROPIONATE HFA 44 MCG/ACT IN AERO: 2.0000 | INHALATION_SPRAY | Freq: Two times a day (BID) | RESPIRATORY_TRACT | Status: DC

## 2012-07-11 MED ORDER — BENZONATATE 100 MG PO CAPS: 100.0000 mg | ORAL_CAPSULE | Freq: Three times a day (TID) | ORAL | Status: DC | PRN

## 2012-07-11 NOTE — Progress Notes (Signed)
Chief Complaint  Patient presents with  . Cough    low grade fever, sinus driange, chest congestion, sore throat, ear pain x sunday     HPI: -started: 3 days ago -symptoms:nasal congestion, sore throat, cough, ears full, chest sore from coughing -denies:fever, SOB, NVD, tooth pain, strep or mono exposure -has tried: robitussin, nasal steroids -sick contacts: none -Hx of: asthma and allergies - working with his doctor on his allergies and has tried a number of different medications. Uses albuterol rarely for wheezing.  ROS: See pertinent positives and negatives per HPI.  Past Medical History  Diagnosis Date  . Anxiety   . HLD (hyperlipidemia)   . HTN (hypertension)   . History of heart attack   . Allergic rhinitis     Family History  Problem Relation Age of Onset  . Hypertension Mother   . Alzheimer's disease Mother   . Hyperlipidemia Father   . Hypertension Father     History   Social History  . Marital Status: Married    Spouse Name: N/A    Number of Children: N/A  . Years of Education: N/A   Occupational History  . Banker    Social History Main Topics  . Smoking status: Former Smoker    Quit date: 09/29/1977  . Smokeless tobacco: Never Used     Comment: qiot om 1980  . Alcohol Use: Yes  . Drug Use: No  . Sexually Active: Yes   Other Topics Concern  . Not on file   Social History Narrative  . No narrative on file    Current outpatient prescriptions:ALPRAZolam (XANAX) 0.5 MG tablet, Take 1 tablet (0.5 mg total) by mouth 3 (three) times daily as needed., Disp: 30 tablet, Rfl: 5;  amLODipine (NORVASC) 5 MG tablet, TAKE 1 TABLET (5 MG TOTAL) BY MOUTH DAILY., Disp: 30 tablet, Rfl: 11;  aspirin 325 MG tablet, Take 325 mg by mouth daily.  , Disp: , Rfl:  Azelastine-Fluticasone (DYMISTA) 137-50 MCG/ACT SUSP, Place 1 spray into the nose 2 (two) times daily., Disp: 23 g, Rfl: 6;  bisoprolol-hydrochlorothiazide (ZIAC) 10-6.25 MG per tablet, TAKE 1 TABLET BY MOUTH  EVERY DAY, Disp: 30 tablet, Rfl: 11;  CRESTOR 20 MG tablet, TAKE 1 TABLET BY MOUTH EVERY DAY, Disp: 30 each, Rfl: 6;  cyclobenzaprine (FLEXERIL) 10 MG tablet, Take 10 mg by mouth 3 (three) times daily as needed. Back pain , Disp: , Rfl:  diclofenac (CATAFLAM) 50 MG tablet, Take 50 mg by mouth 2 (two) times daily as needed. For back pain , Disp: , Rfl: ;  famciclovir (FAMVIR) 500 MG tablet, TAKE AS DIRECTED AND NEEDED, Disp: 10 tablet, Rfl: 1;  folic acid (FOLVITE) 800 MCG tablet, Take 1,600 mcg by mouth daily.  , Disp: , Rfl: ;  Inositol Niacinate (NIACIN FLUSH FREE) 500 MG CAPS, Take 1 capsule by mouth daily.  , Disp: , Rfl:  JENTADUETO 2.12-998 MG TABS, TAKE 1 TABLET BY MOUTH TWICE A DAY, Disp: 60 tablet, Rfl: 2;  l-methylfolate-B6-B12 (METANX) 3-35-2 MG TABS, TAKE 1 TABLET EVERY DAY, Disp: 30 tablet, Rfl: 7;  levofloxacin (LEVAQUIN) 500 MG tablet, Take 1 tablet (500 mg total) by mouth daily., Disp: 7 tablet, Rfl: 0;  losartan (COZAAR) 100 MG tablet, TAKE 1 TABLET EVERY DAY, Disp: 90 tablet, Rfl: 3 LOVAZA 1 G capsule, TAKE 1 CAPSULE BY MOUTH 3 TIMES A DAY, Disp: 90 capsule, Rfl: 5;  Multiple Vitamin (MULTIVITAMIN) tablet, Take 1 tablet by mouth daily.  , Disp: , Rfl: ;  nitroGLYCERIN (NITROLINGUAL) 0.4 MG/SPRAY spray, USE AS DIRECTED, Disp: 30 g, Rfl: 2;  pimecrolimus (ELIDEL) 1 % cream, Apply topically 2 (two) times daily as needed.  , Disp: , Rfl: ;  Pyridoxine HCl (VITAMIN B-6) 500 MG tablet, Take 500 mg by mouth daily.  , Disp: , Rfl:  SYMBICORT 160-4.5 MCG/ACT inhaler, USE 2 PUFFS TWICE A DAY, Disp: 10.2 g, Rfl: 6;  tadalafil (CIALIS) 20 MG tablet, Take 20 mg by mouth as directed.  , Disp: , Rfl: ;  zaleplon (SONATA) 10 MG capsule, TAKE 1 CAPSULE EVERY DAY AS NEEDED, Disp: 30 capsule, Rfl: 5;  benzonatate (TESSALON PERLES) 100 MG capsule, Take 1 capsule (100 mg total) by mouth 3 (three) times daily as needed for cough., Disp: 20 capsule, Rfl: 0 fluticasone (FLOVENT HFA) 44 MCG/ACT inhaler, Inhale 2 puffs  into the lungs 2 (two) times daily., Disp: 1 Inhaler, Rfl: 12  EXAM:  Filed Vitals:   07/11/12 0906  BP: 122/80  Pulse: 70  Temp: 99.1 F (37.3 C)  O2 sats 95%  There is no height on file to calculate BMI.  GENERAL: vitals reviewed and listed above, alert, oriented, appears well hydrated and in no acute distress  HEENT: atraumatic, conjunttiva clear, no obvious abnormalities on inspection of external nose and ears, normal appearance of ear canals and TMs, clear nasal congestion, mild post oropharyngeal erythema with PND, no tonsillar edema or exudate, no sinus TTP  NECK: no obvious masses on inspection  LUNGS: few scattered wheezes, no rales or rhonchi, good air movement  CV: HRRR, no peripheral edema  MS: moves all extremities without noticeable abnormality  PSYCH: pleasant and cooperative, no obvious depression or anxiety  ASSESSMENT AND PLAN:  Discussed the following assessment and plan:  1. Viral upper respiratory infection  benzonatate (TESSALON PERLES) 100 MG capsule  2. Wheezing-associated respiratory infection  fluticasone (FLOVENT HFA) 44 MCG/ACT inhaler   -likely viral URI with wheezing. No respiratory distress, good O2 sats. Discussed options including abx, oral steroids, versus ICS, symptomatic treatment. Pt prefers to do ICS while sick and alb prn first with symptomatic support. -Patient advised to return or notify a doctor immediately if symptoms worsen or persist or new concerns arise.  Patient Instructions  INSTRUCTIONS FOR UPPER RESPIRATORY INFECTION:  -plenty of rest and fluids  -use daily allergy medications  -use flovent twice daily for next 2 weeks and albuterol as needed for wheezing and excessive cough  -nasal saline wash 2-3 times daily (use prepackaged nasal saline or bottled/distilled water if making your own)   -clean nose with nasal saline before using the nasal steroid or sinex  -can use sinex nasal spray for drainage and nasal  congestion - but do NOT use longer then 3-4 days  -can use tylenol or ibuprofen as directed for aches and sorethroat  -in the winter time, using a humidifier at night is helpful (please follow cleaning instructions)  -if you are taking a cough medication - use only as directed, may also try a teaspoon of honey to coat the throat and throat lozenges  -for sore throat, salt water gargles can help  -follow up if you have fevers, facial pain, tooth pain, difficulty breathing or are worsening or not getting better in 3-4 days      Melodie Ashworth, Dahlia Client R.

## 2012-07-11 NOTE — Patient Instructions (Addendum)
INSTRUCTIONS FOR UPPER RESPIRATORY INFECTION:  -plenty of rest and fluids  -use daily allergy medications  -use flovent twice daily for next 2 weeks and albuterol as needed for wheezing and excessive cough  -nasal saline wash 2-3 times daily (use prepackaged nasal saline or bottled/distilled water if making your own)   -clean nose with nasal saline before using the nasal steroid or sinex  -can use sinex nasal spray for drainage and nasal congestion - but do NOT use longer then 3-4 days  -can use tylenol or ibuprofen as directed for aches and sorethroat  -in the winter time, using a humidifier at night is helpful (please follow cleaning instructions)  -if you are taking a cough medication - use only as directed, may also try a teaspoon of honey to coat the throat and throat lozenges  -for sore throat, salt water gargles can help  -follow up if you have fevers, facial pain, tooth pain, difficulty breathing or are worsening or not getting better in 3-4 days

## 2012-07-24 ENCOUNTER — Other Ambulatory Visit: Payer: Self-pay | Admitting: Internal Medicine

## 2012-07-26 ENCOUNTER — Other Ambulatory Visit: Payer: Self-pay | Admitting: Internal Medicine

## 2012-08-07 ENCOUNTER — Other Ambulatory Visit: Payer: Self-pay | Admitting: Internal Medicine

## 2012-08-28 ENCOUNTER — Other Ambulatory Visit: Payer: Self-pay | Admitting: Internal Medicine

## 2012-09-06 ENCOUNTER — Encounter: Payer: BC Managed Care – PPO | Admitting: Internal Medicine

## 2012-09-07 ENCOUNTER — Other Ambulatory Visit: Payer: BC Managed Care – PPO

## 2012-09-11 ENCOUNTER — Encounter: Payer: BC Managed Care – PPO | Admitting: Internal Medicine

## 2012-09-26 ENCOUNTER — Other Ambulatory Visit: Payer: BC Managed Care – PPO

## 2012-09-30 ENCOUNTER — Encounter: Payer: Self-pay | Admitting: Internal Medicine

## 2012-09-30 ENCOUNTER — Ambulatory Visit (INDEPENDENT_AMBULATORY_CARE_PROVIDER_SITE_OTHER): Payer: BC Managed Care – PPO | Admitting: Internal Medicine

## 2012-09-30 VITALS — BP 118/80 | Temp 98.0°F | Wt 226.0 lb

## 2012-09-30 DIAGNOSIS — J069 Acute upper respiratory infection, unspecified: Secondary | ICD-10-CM

## 2012-09-30 MED ORDER — AMOXICILLIN 500 MG PO CAPS: 1000.0000 mg | ORAL_CAPSULE | Freq: Two times a day (BID) | ORAL | Status: DC

## 2012-09-30 MED ORDER — AZELASTINE HCL 0.1 % NA SOLN: 2.0000 | Freq: Two times a day (BID) | NASAL | Status: DC

## 2012-09-30 MED ORDER — FLUTICASONE PROPIONATE 50 MCG/ACT NA SUSP: 2.0000 | Freq: Every day | NASAL | Status: DC

## 2012-09-30 NOTE — Progress Notes (Signed)
  Subjective:    Patient ID: Larry Garner, male    DOB: 1956/05/25, 57 y.o.   MRN: [id number]  HPI [facility], acute visit Sick x 1 day: Headache, face congestion and mild pain, coughing yellowish sputum. Nose is quite congested, unable to do nasal irrigations as he usually does.  Past Medical History  Diagnosis Date  . Anxiety   . HLD (hyperlipidemia)   . HTN (hypertension)   . History of heart attack   . Allergic rhinitis   . Diabetes   . Asthma    Past Surgical History  Procedure Laterality Date  . 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 Marital Status: Married    Spouse Name: N/A    Number of Children: N/A  . Years of Education: N/A   Occupational History  . Banker    Social History Main Topics  . Smoking status: Former Smoker    Quit date: 09/29/1977  . Smokeless tobacco: Never Used     Comment: qiot om 1980  . Alcohol Use: Yes  . Drug Use: No  . Sexually Active: Yes   Other Topics Concern  . Not on file   Social History Narrative  . No narrative on file    Review of Systems Mild subjective fever, no chills. No  nausea, vomiting, diarrhea. No myalgias. Mild chest congestion,  very mild wheezing yesterday.     Objective:   Physical Exam General -- alert, well-developed, NAD .   HEENT -- TMs normal, throat w/o redness, face symmetric and   tender to palpation @ B maxilary sinuses. Nose quite congested and swollen turbinates, light green d/c Lungs -- normal respiratory effort, no intercostal retractions, no accessory muscle use, and normal breath sounds.   Heart-- normal rate, regular rhythm, no murmur, and no gallop.    Psych-- Cognition and judgment appear intact. Alert and cooperative with normal attention span and concentration.  not anxious appearing and not depressed appearing.       Assessment & Plan:  URI, ?early sinusitis. Conservative treatment x 4-5 days, if no better will start abx. Asthma is well  controlled with PRN symbicort  Has a left over tessalon perls, they did work thus recommend to use prn  dymista expensive but helped a lot, change to generics

## 2012-09-30 NOTE — Patient Instructions (Addendum)
Rest, fluids , tylenol For cough, take Mucinex DM twice a day as needed  You can also take tessalon perrles as needed For congestion use astelin nasal spray twice a day and flonase once a day symbicort as needed if wheezing Take the antibiotic as prescribed  (Amoxicillin) only if no better in 4-5 days Call if no better in 10 days Call anytime if the symptoms are severe

## 2012-10-03 ENCOUNTER — Encounter: Payer: BC Managed Care – PPO | Admitting: Internal Medicine

## 2012-10-13 ENCOUNTER — Other Ambulatory Visit: Payer: BC Managed Care – PPO

## 2012-10-16 ENCOUNTER — Other Ambulatory Visit (INDEPENDENT_AMBULATORY_CARE_PROVIDER_SITE_OTHER): Payer: BC Managed Care – PPO

## 2012-10-16 DIAGNOSIS — Z0000 Encounter for general adult medical examination without abnormal findings: Secondary | ICD-10-CM

## 2012-10-16 LAB — MICROALBUMIN / CREATININE URINE RATIO
Creatinine,U: 437.7 mg/dL
Microalb Creat Ratio: 3.7 mg/g (ref 0.0–30.0)
Microalb, Ur: 16.3 mg/dL — ABNORMAL HIGH (ref 0.0–1.9)

## 2012-10-16 LAB — CBC WITH DIFFERENTIAL/PLATELET
Basophils Relative: 0.4 % (ref 0.0–3.0)
Eosinophils Relative: 3 % (ref 0.0–5.0)
Lymphocytes Relative: 18.4 % (ref 12.0–46.0)
MCV: 90.2 fl (ref 78.0–100.0)
Monocytes Relative: 6.5 % (ref 3.0–12.0)
Neutrophils Relative %: 71.7 % (ref 43.0–77.0)
Platelets: 266 10*3/uL (ref 150.0–400.0)
RBC: 4.66 Mil/uL (ref 4.22–5.81)
WBC: 8.5 10*3/uL (ref 4.5–10.5)

## 2012-10-16 LAB — BASIC METABOLIC PANEL
Chloride: 106 mEq/L (ref 96–112)
Creatinine, Ser: 0.9 mg/dL (ref 0.4–1.5)
GFR: 91.16 mL/min (ref >60.00)

## 2012-10-16 LAB — HEPATIC FUNCTION PANEL
ALT: 33 U/L (ref 0–53)
AST: 23 U/L (ref 0–37)
Alkaline Phosphatase: 65 U/L (ref 39–117)
Bilirubin, Direct: 0.2 mg/dL (ref 0.0–0.3)
Total Bilirubin: 0.7 mg/dL (ref 0.3–1.2)
Total Protein: 6.8 g/dL (ref 6.0–8.3)

## 2012-10-16 LAB — POCT URINALYSIS DIPSTICK
Blood, UA: NEGATIVE
Spec Grav, UA: >=1.03
Urobilinogen, UA: 0.2

## 2012-10-16 LAB — LIPID PANEL
HDL: 28.7 mg/dL — ABNORMAL LOW (ref >39.00)
Total CHOL/HDL Ratio: 4
Triglycerides: 169 mg/dL — ABNORMAL HIGH (ref 0.0–149.0)

## 2012-10-20 ENCOUNTER — Encounter: Payer: Self-pay | Admitting: Internal Medicine

## 2012-10-20 ENCOUNTER — Ambulatory Visit (INDEPENDENT_AMBULATORY_CARE_PROVIDER_SITE_OTHER): Payer: BC Managed Care – PPO | Admitting: Internal Medicine

## 2012-10-20 VITALS — BP 130/80 | HR 60 | Temp 97.8°F | Ht 71.25 in | Wt 224.0 lb

## 2012-10-20 DIAGNOSIS — F411 Generalized anxiety disorder: Secondary | ICD-10-CM

## 2012-10-20 DIAGNOSIS — E1165 Type 2 diabetes mellitus with hyperglycemia: Secondary | ICD-10-CM

## 2012-10-20 DIAGNOSIS — Z23 Encounter for immunization: Secondary | ICD-10-CM

## 2012-10-20 DIAGNOSIS — Z0000 Encounter for general adult medical examination without abnormal findings: Secondary | ICD-10-CM

## 2012-10-20 DIAGNOSIS — IMO0002 Reserved for concepts with insufficient information to code with codable children: Secondary | ICD-10-CM

## 2012-10-20 DIAGNOSIS — E785 Hyperlipidemia, unspecified: Secondary | ICD-10-CM

## 2012-10-20 DIAGNOSIS — I10 Essential (primary) hypertension: Secondary | ICD-10-CM

## 2012-10-20 NOTE — Progress Notes (Signed)
Subjective:    Patient ID: Larry Garner, male    DOB: [birth year], 57 y.o.   MRN: [id number]  HPI  CPX   With CAD risk  Review of Systems  Constitutional: Negative for fever and fatigue.  HENT: Negative for hearing loss, congestion, neck pain and postnasal drip.   Eyes: Negative for discharge, redness and visual disturbance.  Respiratory: Negative for cough, shortness of breath and wheezing.   Cardiovascular: Negative for leg swelling.  Gastrointestinal: Negative for abdominal pain, constipation and abdominal distention.  Genitourinary: Negative for urgency and frequency.  Musculoskeletal: Negative for joint swelling and arthralgias.  Skin: Negative for color change and rash.  Neurological: Negative for weakness and light-headedness.  Hematological: Negative for adenopathy.  Psychiatric/Behavioral: Negative for behavioral problems.       Past Medical History  Diagnosis Date  . Anxiety   . HLD (hyperlipidemia)   . HTN (hypertension)   . History of heart attack   . Allergic rhinitis   . Diabetes   . Asthma     History   Social History  . Marital Status: Married    Spouse Name: N/A    Number of Children: N/A  . Years of Education: N/A   Occupational History  . Banker    Social History Main Topics  . Smoking status: Former Smoker    Quit date: 09/29/1977  . Smokeless tobacco: Never Used     Comment: qiot om 1980  . Alcohol Use: Yes  . Drug Use: No  . Sexually Active: Yes   Other Topics Concern  . Not on file   Social History Narrative  . No narrative on file    Past Surgical History  Procedure Laterality Date  . 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 Hypertension Mother   . Alzheimer's disease Mother   . Hyperlipidemia Father   . Hypertension Father     Allergies  Allergen Reactions  . Morphine Sulfate     REACTION: nausea    Current Outpatient Prescriptions on File Prior to Visit   Medication Sig Dispense Refill  . ALPRAZolam (XANAX) 0.5 MG tablet TAKE 1 TABLET EVERY 6 HOURS AS NEEDED  30 tablet  5  . amLODipine (NORVASC) 5 MG tablet TAKE 1 TABLET (5 MG TOTAL) BY MOUTH DAILY.  30 tablet  11  . aspirin 325 MG tablet Take 325 mg by mouth daily.        Marland Kitchen azelastine (ASTELIN) 137 MCG/SPRAY nasal spray Place 2 sprays into the nose 2 (two) times daily. Use in each nostril as directed  30 mL  3  . benzonatate (TESSALON PERLES) 100 MG capsule Take 1 capsule (100 mg total) by mouth 3 (three) times daily as needed for cough.  20 capsule  0  . bisoprolol-hydrochlorothiazide (ZIAC) 10-6.25 MG per tablet TAKE 1 TABLET BY MOUTH EVERY DAY  30 tablet  11  . CRESTOR 20 MG tablet TAKE 1 TABLET BY MOUTH EVERY DAY  30 each  6  . cyclobenzaprine (FLEXERIL) 10 MG tablet Take 10 mg by mouth 3 (three) times daily as needed. Back pain       . diclofenac (CATAFLAM) 50 MG tablet Take 50 mg by mouth 2 (two) times daily as needed. For back pain       . famciclovir (FAMVIR) 500 MG tablet TAKE AS DIRECTED AND NEEDED  10 tablet  1  . fluticasone (FLONASE) 50 MCG/ACT nasal spray  Place 2 sprays into the nose daily.  16 g  3  . folic acid (FOLVITE) 800 MCG tablet Take 1,600 mcg by mouth daily.        . Inositol Niacinate (NIACIN FLUSH FREE) 500 MG CAPS Take 1 capsule by mouth daily.        . JENTADUETO 2.12-998 MG TABS TAKE 1 TABLET BY MOUTH TWICE A DAY  60 tablet  2  . l-methylfolate-B6-B12 (METANX) 3-35-2 MG TABS TAKE 1 TABLET EVERY DAY  30 tablet  7  . losartan (COZAAR) 100 MG tablet TAKE 1 TABLET EVERY DAY  90 tablet  3  . LOVAZA 1 G capsule TAKE 1 CAPSULE BY MOUTH 3 TIMES A DAY  90 capsule  5  . Multiple Vitamin (MULTIVITAMIN) tablet Take 1 tablet by mouth daily.        . nitroGLYCERIN (NITROLINGUAL) 0.4 MG/SPRAY spray USE AS DIRECTED  30 g  2  . pimecrolimus (ELIDEL) 1 % cream Apply topically 2 (two) times daily as needed.        . Pyridoxine HCl (VITAMIN B-6) 500 MG tablet Take 500 mg by mouth  daily.        . SYMBICORT 160-4.5 MCG/ACT inhaler USE 2 PUFFS TWICE A DAY  10.2 g  6  . tadalafil (CIALIS) 20 MG tablet Take 20 mg by mouth as directed.        . zaleplon (SONATA) 10 MG capsule TAKE 1 CAPSULE EVERY DAY AS NEEDED  30 capsule  5   No current facility-administered medications on file prior to visit.    BP 130/80  Pulse 60  Temp(Src) 97.8 F (36.6 C) (Oral)  Ht 5' 11.25" (1.81 m)  Wt 224 lb (101.606 kg)  BMI 31.01 kg/m2    Objective:   Physical Exam  Constitutional: He is oriented to person, place, and time. He appears well-developed and well-nourished.  HENT:  Head: Normocephalic and atraumatic.  Eyes: Conjunctivae are normal. Pupils are equal, round, and reactive to light.  Neck: Normal range of motion. Neck supple.  Cardiovascular: Normal rate and regular rhythm.   Pulmonary/Chest: Effort normal and breath sounds normal.  Abdominal: Soft. Bowel sounds are normal.  Genitourinary: Rectum normal and prostate normal.  Musculoskeletal: Normal range of motion.  Neurological: He is alert and oriented to person, place, and time.     Diabetic foot completed     Assessment & Plan:  Diabetic foot exam:  Left: Reflexes 3+   Vibratory sensation normal  Proprioception normal  Sharp/dull discrimination normal  Filament test present Right: Reflexes 4+   Vibratory sensation normal  Proprioception normal  Sharp/dull discrimination normal  Filament test present   Patient presents for yearly preventative medicine examination.   all immunizations and health maintenance protocols were reviewed with the patient and they are up to date with these protocols.   screening laboratory values were reviewed with the patient including screening of hyperlipidemia PSA renal function and hepatic function.   There medications past medical history social history problem list and allergies were reviewed in detail.   Goals were established with regard to weight loss exercise diet  in compliance with medications  The patient  Will agree to go on victoza if the a1c cannot reach 7.5 in 3 months

## 2012-10-20 NOTE — Patient Instructions (Signed)
If the A1C is not less than 7. In 3 months will institute victoza shots

## 2012-10-24 ENCOUNTER — Other Ambulatory Visit: Payer: Self-pay | Admitting: Internal Medicine

## 2012-11-24 ENCOUNTER — Other Ambulatory Visit: Payer: Self-pay | Admitting: Internal Medicine

## 2012-11-30 ENCOUNTER — Other Ambulatory Visit: Payer: Self-pay | Admitting: Internal Medicine

## 2012-12-28 ENCOUNTER — Other Ambulatory Visit: Payer: Self-pay | Admitting: Internal Medicine

## 2013-01-05 ENCOUNTER — Other Ambulatory Visit: Payer: BC Managed Care – PPO

## 2013-01-12 ENCOUNTER — Encounter: Payer: BC Managed Care – PPO | Admitting: Internal Medicine

## 2013-01-17 ENCOUNTER — Other Ambulatory Visit: Payer: Self-pay | Admitting: Internal Medicine

## 2013-01-22 ENCOUNTER — Other Ambulatory Visit (INDEPENDENT_AMBULATORY_CARE_PROVIDER_SITE_OTHER): Payer: BC Managed Care – PPO

## 2013-01-22 DIAGNOSIS — E1169 Type 2 diabetes mellitus with other specified complication: Secondary | ICD-10-CM

## 2013-01-22 DIAGNOSIS — E1165 Type 2 diabetes mellitus with hyperglycemia: Secondary | ICD-10-CM

## 2013-01-22 DIAGNOSIS — IMO0002 Reserved for concepts with insufficient information to code with codable children: Secondary | ICD-10-CM

## 2013-01-22 LAB — BASIC METABOLIC PANEL
BUN: 14 mg/dL (ref 6–23)
CO2: 23 mEq/L (ref 19–32)
Glucose, Bld: 142 mg/dL — ABNORMAL HIGH (ref 70–99)
Potassium: 3.7 mEq/L (ref 3.5–5.1)
Sodium: 138 mEq/L (ref 135–145)

## 2013-01-23 ENCOUNTER — Other Ambulatory Visit: Payer: Self-pay | Admitting: Internal Medicine

## 2013-01-29 ENCOUNTER — Ambulatory Visit (INDEPENDENT_AMBULATORY_CARE_PROVIDER_SITE_OTHER): Payer: BC Managed Care – PPO | Admitting: Internal Medicine

## 2013-01-29 ENCOUNTER — Encounter: Payer: Self-pay | Admitting: Internal Medicine

## 2013-01-29 VITALS — BP 134/84 | HR 72 | Temp 98.2°F | Resp 16 | Ht 71.25 in | Wt 230.0 lb

## 2013-01-29 DIAGNOSIS — IMO0002 Reserved for concepts with insufficient information to code with codable children: Secondary | ICD-10-CM

## 2013-01-29 DIAGNOSIS — E1169 Type 2 diabetes mellitus with other specified complication: Secondary | ICD-10-CM

## 2013-01-29 DIAGNOSIS — E1165 Type 2 diabetes mellitus with hyperglycemia: Secondary | ICD-10-CM

## 2013-01-29 NOTE — Progress Notes (Signed)
Subjective:    Patient ID: Larry Garner, male    DOB: 1956/02/14, 57 y.o.   MRN: [id number]  HPI Weight issues DM  a1c down from 10.8 to 7.8 Goal below seven Weight and diet reveiwed ADDM CV risks high   Review of Systems  Constitutional: Negative for fever and fatigue.  HENT: Negative for hearing loss, congestion, neck pain and postnasal drip.   Eyes: Negative for discharge, redness and visual disturbance.  Respiratory: Negative for cough, shortness of breath and wheezing.   Cardiovascular: Negative for leg swelling.  Gastrointestinal: Negative for abdominal pain, constipation and abdominal distention.  Genitourinary: Negative for urgency and frequency.  Musculoskeletal: Negative for joint swelling and arthralgias.  Skin: Negative for color change and rash.  Neurological: Negative for weakness and light-headedness.  Hematological: Negative for adenopathy.  Psychiatric/Behavioral: Negative for behavioral problems.   Past Medical History  Diagnosis Date  . Anxiety   . HLD (hyperlipidemia)   . HTN (hypertension)   . History of heart attack   . Allergic rhinitis   . Diabetes   . Asthma     History   Social History  . Marital Status: Married    Spouse Name: N/A    Number of Children: N/A  . Years of Education: N/A   Occupational History  . Banker    Social History Main Topics  . Smoking status: Former Smoker    Quit date: 09/29/1977  . Smokeless tobacco: Never Used     Comment: qiot om 1980  . Alcohol Use: Yes  . Drug Use: No  . Sexually Active: Yes   Other Topics Concern  . Not on file   Social History Narrative  . No narrative on file    Past Surgical History  Procedure Laterality Date  . 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 Hypertension Mother   . Alzheimer's disease Mother   . Hyperlipidemia Father   . Hypertension Father     Allergies  Allergen Reactions  . Morphine Sulfate    REACTION: nausea    Current Outpatient Prescriptions on File Prior to Visit  Medication Sig Dispense Refill  . ALPRAZolam (XANAX) 0.5 MG tablet TAKE 1 TABLET BY MOUTH EVERY 6 HOURS AS NEEDED  30 tablet  5  . amLODipine (NORVASC) 5 MG tablet TAKE 1 TABLET (5 MG TOTAL) BY MOUTH DAILY.  30 tablet  11  . aspirin 325 MG tablet Take 325 mg by mouth daily.        Marland Kitchen azelastine (ASTELIN) 137 MCG/SPRAY nasal spray Place 2 sprays into the nose 2 (two) times daily. Use in each nostril as directed  30 mL  3  . benzonatate (TESSALON PERLES) 100 MG capsule Take 1 capsule (100 mg total) by mouth 3 (three) times daily as needed for cough.  20 capsule  0  . bisoprolol-hydrochlorothiazide (ZIAC) 10-6.25 MG per tablet TAKE 1 TABLET BY MOUTH EVERY DAY  30 tablet  11  . CRESTOR 20 MG tablet TAKE 1 TABLET BY MOUTH EVERY DAY  30 tablet  6  . cyclobenzaprine (FLEXERIL) 10 MG tablet Take 10 mg by mouth 3 (three) times daily as needed. Back pain       . diclofenac (CATAFLAM) 50 MG tablet Take 50 mg by mouth 2 (two) times daily as needed. For back pain       . famciclovir (FAMVIR) 500 MG tablet TAKE AS DIRECTED AND NEEDED  10  tablet  1  . fluticasone (FLONASE) 50 MCG/ACT nasal spray Place 2 sprays into the nose daily.  16 g  3  . folic acid (FOLVITE) 800 MCG tablet Take 1,600 mcg by mouth daily.        . Inositol Niacinate (NIACIN FLUSH FREE) 500 MG CAPS Take 1 capsule by mouth daily.        . JENTADUETO 2.12-998 MG TABS TAKE 1 TABLET BY MOUTH TWICE A DAY  60 tablet  11  . l-methylfolate-B6-B12 (METANX) 3-35-2 MG TABS TAKE 1 TABLET EVERY DAY  30 tablet  7  . losartan (COZAAR) 100 MG tablet TAKE 1 TABLET EVERY DAY  90 tablet  3  . LOVAZA 1 G capsule TAKE 1 CAPSULE BY MOUTH 3 TIMES A DAY  90 capsule  5  . Multiple Vitamin (MULTIVITAMIN) tablet Take 1 tablet by mouth daily.        . nitroGLYCERIN (NITROLINGUAL) 0.4 MG/SPRAY spray USE AS DIRECTED  30 g  2  . pimecrolimus (ELIDEL) 1 % cream Apply topically 2 (two) times  daily as needed.        . Pyridoxine HCl (VITAMIN B-6) 500 MG tablet Take 500 mg by mouth daily.        . SYMBICORT 160-4.5 MCG/ACT inhaler USE 2 PUFFS TWICE A DAY  10.2 g  6  . tadalafil (CIALIS) 20 MG tablet Take 20 mg by mouth as directed.        . zaleplon (SONATA) 10 MG capsule TAKE ONE CAPSULE EVERY DAY AS NEEDED  30 capsule  5   No current facility-administered medications on file prior to visit.    BP 134/84  Pulse 72  Temp(Src) 98.2 F (36.8 C)  Resp 16  Ht 5' 11.25" (1.81 m)  Wt 230 lb (104.327 kg)  BMI 31.84 kg/m2       Objective:   Physical Exam  Nursing note and vitals reviewed. Constitutional: He appears well-developed and well-nourished.  HENT:  Head: Normocephalic and atraumatic.  Eyes: Conjunctivae are normal. Pupils are equal, round, and reactive to light.  Neck: Normal range of motion. Neck supple.  Cardiovascular: Normal rate and regular rhythm.   Murmur heard. Pulmonary/Chest: Effort normal and breath sounds normal.  Abdominal: Soft. Bowel sounds are normal.          Assessment & Plan:  DM much imporoved wirth new goal of A1c of 6.8 with weight reduction  Lipid and liver 4 months  I have spent more than 30 minutes examining this patient face-to-face of which over half was spent in counseling weight and CV dz

## 2013-03-04 ENCOUNTER — Other Ambulatory Visit: Payer: Self-pay | Admitting: Internal Medicine

## 2013-05-14 ENCOUNTER — Telehealth: Payer: Self-pay | Admitting: Internal Medicine

## 2013-05-14 NOTE — Telephone Encounter (Signed)
Ok , we will call with his labs

## 2013-05-14 NOTE — Telephone Encounter (Signed)
Pt called and stated that he will be unable to come in this month for his 4 month fu appt regarding his a1c. He is still going to have the lab completed, but has canceled the fu appt. He has scheduled his CPX to be completed in March. FYI.

## 2013-05-16 ENCOUNTER — Ambulatory Visit (INDEPENDENT_AMBULATORY_CARE_PROVIDER_SITE_OTHER): Payer: BC Managed Care – PPO

## 2013-05-16 DIAGNOSIS — Z23 Encounter for immunization: Secondary | ICD-10-CM

## 2013-05-25 ENCOUNTER — Other Ambulatory Visit (INDEPENDENT_AMBULATORY_CARE_PROVIDER_SITE_OTHER): Payer: BC Managed Care – PPO

## 2013-05-25 DIAGNOSIS — E119 Type 2 diabetes mellitus without complications: Secondary | ICD-10-CM

## 2013-05-25 LAB — HEMOGLOBIN A1C: Hgb A1c MFr Bld: 7.8 % — ABNORMAL HIGH (ref 4.6–6.5)

## 2013-06-01 ENCOUNTER — Ambulatory Visit: Payer: BC Managed Care – PPO | Admitting: Internal Medicine

## 2013-06-28 ENCOUNTER — Other Ambulatory Visit: Payer: Self-pay | Admitting: Internal Medicine

## 2013-07-02 ENCOUNTER — Other Ambulatory Visit: Payer: Self-pay | Admitting: Internal Medicine

## 2013-07-27 ENCOUNTER — Other Ambulatory Visit: Payer: Self-pay | Admitting: Internal Medicine

## 2013-08-15 ENCOUNTER — Encounter: Payer: Self-pay | Admitting: Gastroenterology

## 2013-08-21 ENCOUNTER — Encounter: Payer: Self-pay | Admitting: Family Medicine

## 2013-08-21 ENCOUNTER — Ambulatory Visit (INDEPENDENT_AMBULATORY_CARE_PROVIDER_SITE_OTHER): Payer: BC Managed Care – PPO | Admitting: Family Medicine

## 2013-08-21 VITALS — BP 120/76 | HR 85 | Temp 102.9°F | Wt 227.0 lb

## 2013-08-21 DIAGNOSIS — J4520 Mild intermittent asthma, uncomplicated: Secondary | ICD-10-CM

## 2013-08-21 DIAGNOSIS — R509 Fever, unspecified: Secondary | ICD-10-CM

## 2013-08-21 DIAGNOSIS — J111 Influenza due to unidentified influenza virus with other respiratory manifestations: Secondary | ICD-10-CM

## 2013-08-21 DIAGNOSIS — R69 Illness, unspecified: Secondary | ICD-10-CM

## 2013-08-21 DIAGNOSIS — J45909 Unspecified asthma, uncomplicated: Secondary | ICD-10-CM

## 2013-08-21 DIAGNOSIS — J45901 Unspecified asthma with (acute) exacerbation: Secondary | ICD-10-CM

## 2013-08-21 LAB — POCT INFLUENZA A/B
INFLUENZA A, POC: NEGATIVE
Influenza B, POC: NEGATIVE

## 2013-08-21 MED ORDER — OSELTAMIVIR PHOSPHATE 75 MG PO CAPS: 75.0000 mg | ORAL_CAPSULE | Freq: Two times a day (BID) | ORAL | Status: DC

## 2013-08-21 MED ORDER — PREDNISONE 20 MG PO TABS: 40.0000 mg | ORAL_TABLET | Freq: Every day | ORAL | Status: DC

## 2013-08-21 MED ORDER — AZITHROMYCIN 250 MG PO TABS: ORAL_TABLET | ORAL | Status: DC

## 2013-08-21 NOTE — Patient Instructions (Signed)
-  As we discussed, we have prescribed a new medication for you at this appointment. We discussed the common and serious potential adverse effects of this medication and you can review these and more with the pharmacist when you pick up your medication.  Please follow the instructions for use carefully and notify us immediately if you have any problems taking this medication.  -go to hospital if worsening or not improving

## 2013-08-21 NOTE — Progress Notes (Signed)
Pre visit review using our clinic review tool, if applicable. No additional management support is needed unless otherwise documented below in the visit note. 

## 2013-08-21 NOTE — Progress Notes (Signed)
No chief complaint on file.   HPI:  -started: 3 days ago with cold symptoms, fever last 2 days -symptoms:nasal congestion, sore throat, cough, mild sob -denies:fever, SOB, NVD, tooth pain -has tried: inhaler which helps -sick contacts/travel/risks: denies flu exposure or Ebola risks -Hx of: allergies and asthma -reports he gets sick like this every year and is treated with abx and prednisone and does fine  ROS: See pertinent positives and negatives per HPI.  Past Medical History  Diagnosis Date  . Anxiety   . HLD (hyperlipidemia)   . HTN (hypertension)   . History of heart attack   . Allergic rhinitis   . Diabetes   . Asthma     Past Surgical History  Procedure Laterality Date  . 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 Hypertension Mother   . Alzheimer's disease Mother   . Hyperlipidemia Father   . Hypertension Father     History   Social History  . Marital Status: Married    Spouse Name: N/A    Number of Children: N/A  . Years of Education: N/A   Occupational History  . Banker    Social History Main Topics  . Smoking status: Former Smoker    Quit date: 09/29/1977  . Smokeless tobacco: Never Used     Comment: qiot om 1980  . Alcohol Use: Yes  . Drug Use: No  . Sexual Activity: Yes   Other Topics Concern  . None   Social History Narrative  . None    Current outpatient prescriptions:ALPRAZolam (XANAX) 0.5 MG tablet, TAKE 1 TABLET BY MOUTH EVERY 6 HOURS AS NEEDED, Disp: 30 tablet, Rfl: 5;  amLODipine (NORVASC) 5 MG tablet, TAKE 1 TABLET (5 MG TOTAL) BY MOUTH DAILY., Disp: 30 tablet, Rfl: 11;  aspirin 325 MG tablet, Take 325 mg by mouth daily.  , Disp: , Rfl:  azelastine (ASTELIN) 137 MCG/SPRAY nasal spray, Place 2 sprays into the nose 2 (two) times daily. Use in each nostril as directed, Disp: 30 mL, Rfl: 3;  benzonatate (TESSALON PERLES) 100 MG capsule, Take 1 capsule (100 mg total) by mouth 3 (three)  times daily as needed for cough., Disp: 20 capsule, Rfl: 0;  bisoprolol-hydrochlorothiazide (ZIAC) 10-6.25 MG per tablet, TAKE 1 TABLET BY MOUTH EVERY DAY, Disp: 30 tablet, Rfl: 11 CRESTOR 20 MG tablet, TAKE 1 TABLET BY MOUTH EVERY DAY, Disp: 30 tablet, Rfl: 11;  cyclobenzaprine (FLEXERIL) 10 MG tablet, Take 10 mg by mouth 3 (three) times daily as needed. Back pain , Disp: , Rfl: ;  diclofenac (CATAFLAM) 50 MG tablet, Take 50 mg by mouth 2 (two) times daily as needed. For back pain , Disp: , Rfl: ;  famciclovir (FAMVIR) 500 MG tablet, TAKE AS DIRECTED AND NEEDED, Disp: 10 tablet, Rfl: 1 fluticasone (FLONASE) 50 MCG/ACT nasal spray, Place 2 sprays into the nose daily., Disp: 16 g, Rfl: 3;  folic acid (FOLVITE) Q000111Q MCG tablet, Take 1,600 mcg by mouth daily.  , Disp: , Rfl: ;  Inositol Niacinate (NIACIN FLUSH FREE) 500 MG CAPS, Take 1 capsule by mouth daily.  , Disp: , Rfl: ;  JENTADUETO 2.12-998 MG TABS, TAKE 1 TABLET BY MOUTH TWICE A DAY, Disp: 60 tablet, Rfl: 11 l-methylfolate-B6-B12 (METANX) 3-35-2 MG TABS, TAKE 1 TABLET EVERY DAY, Disp: 90 tablet, Rfl: 3;  losartan (COZAAR) 100 MG tablet, TAKE 1 TABLET EVERY DAY, Disp: 90 tablet, Rfl: 3;  Multiple Vitamin (MULTIVITAMIN)  tablet, Take 1 tablet by mouth daily.  , Disp: , Rfl: ;  nitroGLYCERIN (NITROLINGUAL) 0.4 MG/SPRAY spray, USE AS DIRECTED, Disp: 30 g, Rfl: 2 omega-3 acid ethyl esters (LOVAZA) 1 G capsule, TAKE 1 CAPSULE BY MOUTH 3 TIMES A DAY, Disp: 90 capsule, Rfl: 5;  pimecrolimus (ELIDEL) 1 % cream, Apply topically 2 (two) times daily as needed.  , Disp: , Rfl: ;  Pyridoxine HCl (VITAMIN B-6) 500 MG tablet, Take 500 mg by mouth daily.  , Disp: , Rfl: ;  SYMBICORT 160-4.5 MCG/ACT inhaler, USE 2 PUFFS TWICE A DAY, Disp: 10.2 g, Rfl: 6 tadalafil (CIALIS) 20 MG tablet, Take 20 mg by mouth as directed.  , Disp: , Rfl: ;  zaleplon (SONATA) 10 MG capsule, TAKE ONE CAPSULE EVERY DAY AS NEEDED, Disp: 30 capsule, Rfl: 4;  azithromycin (ZITHROMAX) 250 MG tablet, 2  tabs on first day and then 1 tab daily for 4 more days, Disp: 6 tablet, Rfl: 0;  oseltamivir (TAMIFLU) 75 MG capsule, Take 1 capsule (75 mg total) by mouth 2 (two) times daily., Disp: 10 capsule, Rfl: 0 predniSONE (DELTASONE) 20 MG tablet, Take 2 tablets (40 mg total) by mouth daily with breakfast., Disp: 10 tablet, Rfl: 0  EXAM:  Filed Vitals:   08/21/13 0921  BP: 120/76  Pulse: 85  Temp: 102.9 F (39.4 C)    Body mass index is 31.43 kg/(m^2).  GENERAL: vitals reviewed and listed above, alert, oriented, appears well hydrated and in no acute distress  HEENT: atraumatic, conjunttiva clear, no obvious abnormalities on inspection of external nose and ears, normal appearance of ear canals and TMs, clear nasal congestion, mild post oropharyngeal erythema with PND, no tonsillar edema or exudate, no sinus TTP  NECK: no obvious masses on inspection  LUNGS: clear to auscultation bilaterally, no wheezes, rales or rhonchi, good air movement  CV: HRRR, no peripheral edema  MS: moves all extremities without noticeable abnormality  PSYCH: pleasant and cooperative, no obvious depression or anxiety  ASSESSMENT AND PLAN:  Discussed the following assessment and plan:  Fever - Plan: POC Influenza A/B, oseltamivir (TAMIFLU) 75 MG capsule, azithromycin (ZITHROMAX) 250 MG tablet, predniSONE (DELTASONE) 20 MG tablet  Influenza-like illness - Plan: oseltamivir (TAMIFLU) 75 MG capsule, azithromycin (ZITHROMAX) 250 MG tablet, predniSONE (DELTASONE) 20 MG tablet  Asthma, mild intermittent - Plan: oseltamivir (TAMIFLU) 75 MG capsule, azithromycin (ZITHROMAX) 250 MG tablet, predniSONE (DELTASONE) 20 MG tablet  Asthma with acute exacerbation - Plan: oseltamivir (TAMIFLU) 75 MG capsule, azithromycin (ZITHROMAX) 250 MG tablet, predniSONE (DELTASONE) 20 MG tablet  -rapid flu neg but suspect influenza -advised he is high risks - no resp distress, O2 >92 and does not wish to go to hospital -We discussed  treatment side effects, likely course, antibiotic misuse, transmission, and signs of developing a serious illness. -decided to do azithromycin, tamiflu, prednisone and advised if any worsening or not getting better to go to the ED -discussed risks of steroid with diabetes - he reports he is well aware of these and has taken many times before when diabetes worse -offered alb - he does not want to take this -of course, we advised to return or notify a doctor immediately if symptoms worsen or persist or new concerns arise.    Patient Instructions  -As we discussed, we have prescribed a new medication for you at this appointment. We discussed the common and serious potential adverse effects of this medication and you can review these and more with the pharmacist when you  pick up your medication.  Please follow the instructions for use carefully and notify us immediately if you have any problems taking this medication.  -go to hospital if worsening or not improving     Ashlea Dusing R.

## 2013-08-23 ENCOUNTER — Encounter: Payer: Self-pay | Admitting: Gastroenterology

## 2013-08-27 ENCOUNTER — Other Ambulatory Visit: Payer: Self-pay | Admitting: Internal Medicine

## 2013-09-12 ENCOUNTER — Encounter: Payer: Self-pay | Admitting: *Deleted

## 2013-09-13 ENCOUNTER — Encounter: Payer: Self-pay | Admitting: Family Medicine

## 2013-09-13 ENCOUNTER — Ambulatory Visit (INDEPENDENT_AMBULATORY_CARE_PROVIDER_SITE_OTHER): Payer: BC Managed Care – PPO | Admitting: Family Medicine

## 2013-09-13 VITALS — BP 118/70 | Temp 98.8°F | Wt 228.0 lb

## 2013-09-13 DIAGNOSIS — R059 Cough, unspecified: Secondary | ICD-10-CM

## 2013-09-13 DIAGNOSIS — J45909 Unspecified asthma, uncomplicated: Secondary | ICD-10-CM

## 2013-09-13 DIAGNOSIS — J4520 Mild intermittent asthma, uncomplicated: Secondary | ICD-10-CM

## 2013-09-13 DIAGNOSIS — R058 Other specified cough: Secondary | ICD-10-CM

## 2013-09-13 DIAGNOSIS — J309 Allergic rhinitis, unspecified: Secondary | ICD-10-CM

## 2013-09-13 DIAGNOSIS — R05 Cough: Secondary | ICD-10-CM

## 2013-09-13 DIAGNOSIS — R0982 Postnasal drip: Secondary | ICD-10-CM

## 2013-09-13 NOTE — Progress Notes (Signed)
Pre visit review using our clinic review tool, if applicable. No additional management support is needed unless otherwise documented below in the visit note. 

## 2013-09-13 NOTE — Patient Instructions (Signed)
-  restart allergy meds and inhaler daily  -cough medication as needed  -follow up for your physical and/or as needed

## 2013-09-13 NOTE — Progress Notes (Signed)
Chief Complaint  Patient presents with  . Cough    congestion    HPI:  Larry Garner is a 58 yo M pt of [name] with PMH sig for anxiety, asthma and allergic rhinitis here for an acute visit for:  Cough: -seen 1/13 for influenzal like illness and tx with abx, tamiflu and prednisone -hx of prednisone use at least yearly -reports doing much better but productive cough has lingered some occ wheezing has used inhaler twice, PND -taking robitussin - not on allergy regimen, no using sympbicort -denies: blood in sputum, fevers, SOB, sinus pain  ROS: See pertinent positives and negatives per HPI.  Past Medical History  Diagnosis Date  . Anxiety   . HLD (hyperlipidemia)   . HTN (hypertension)   . History of heart attack   . Allergic rhinitis   . Diabetes   . Asthma     Past Surgical History  Procedure Laterality Date  . 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 Hypertension Mother   . Alzheimer's disease Mother   . Hyperlipidemia Father   . Hypertension Father     History   Social History  . Marital Status: Married    Spouse Name: N/A    Number of Children: N/A  . Years of Education: N/A   Occupational History  . Banker    Social History Main Topics  . Smoking status: Former Smoker    Quit date: 09/29/1977  . Smokeless tobacco: Never Used     Comment: qiot om 1980  . Alcohol Use: Yes  . Drug Use: No  . Sexual Activity: Yes   Other Topics Concern  . None   Social History Narrative  . None    Current outpatient prescriptions:ALPRAZolam (XANAX) 0.5 MG tablet, TAKE 1 TABLET BY MOUTH EVERY 6 HOURS AS NEEDED, Disp: 30 tablet, Rfl: 5;  amLODipine (NORVASC) 5 MG tablet, TAKE 1 TABLET (5 MG TOTAL) BY MOUTH DAILY., Disp: 30 tablet, Rfl: 11;  aspirin 325 MG tablet, Take 325 mg by mouth daily.  , Disp: , Rfl:  azelastine (ASTELIN) 137 MCG/SPRAY nasal spray, Place 2 sprays into the nose 2 (two) times daily. Use in  each nostril as directed, Disp: 30 mL, Rfl: 3;  bisoprolol-hydrochlorothiazide (ZIAC) 10-6.25 MG per tablet, TAKE 1 TABLET BY MOUTH EVERY DAY, Disp: 30 tablet, Rfl: 11;  CRESTOR 20 MG tablet, TAKE 1 TABLET BY MOUTH EVERY DAY, Disp: 30 tablet, Rfl: 11 cyclobenzaprine (FLEXERIL) 10 MG tablet, Take 10 mg by mouth 3 (three) times daily as needed. Back pain , Disp: , Rfl: ;  diclofenac (CATAFLAM) 50 MG tablet, Take 50 mg by mouth 2 (two) times daily as needed. For back pain , Disp: , Rfl: ;  famciclovir (FAMVIR) 500 MG tablet, TAKE AS DIRECTED AND NEEDED, Disp: 10 tablet, Rfl: 1;  fluticasone (FLONASE) 50 MCG/ACT nasal spray, Place 2 sprays into the nose daily., Disp: 16 g, Rfl: 3 folic acid (FOLVITE) 568 MCG tablet, Take 1,600 mcg by mouth daily.  , Disp: , Rfl: ;  Inositol Niacinate (NIACIN FLUSH FREE) 500 MG CAPS, Take 1 capsule by mouth daily.  , Disp: , Rfl: ;  JENTADUETO 2.12-998 MG TABS, TAKE 1 TABLET BY MOUTH TWICE A DAY, Disp: 60 tablet, Rfl: 11;  l-methylfolate-B6-B12 (METANX) 3-35-2 MG TABS, TAKE 1 TABLET EVERY DAY, Disp: 90 tablet, Rfl: 3 losartan (COZAAR) 100 MG tablet, TAKE 1 TABLET EVERY DAY, Disp: 90 tablet,  Rfl: 3;  Multiple Vitamin (MULTIVITAMIN) tablet, Take 1 tablet by mouth daily.  , Disp: , Rfl: ;  nitroGLYCERIN (NITROLINGUAL) 0.4 MG/SPRAY spray, USE AS DIRECTED, Disp: 30 g, Rfl: 2;  omega-3 acid ethyl esters (LOVAZA) 1 G capsule, TAKE 1 CAPSULE BY MOUTH 3 TIMES A DAY, Disp: 90 capsule, Rfl: 5 pimecrolimus (ELIDEL) 1 % cream, Apply topically 2 (two) times daily as needed.  , Disp: , Rfl: ;  Pyridoxine HCl (VITAMIN B-6) 500 MG tablet, Take 500 mg by mouth daily.  , Disp: , Rfl: ;  SYMBICORT 160-4.5 MCG/ACT inhaler, USE 2 PUFFS TWICE A DAY, Disp: 10.2 g, Rfl: 3;  tadalafil (CIALIS) 20 MG tablet, Take 20 mg by mouth as directed.  , Disp: , Rfl: ;  zaleplon (SONATA) 10 MG capsule, TAKE ONE CAPSULE EVERY DAY AS NEEDED, Disp: 30 capsule, Rfl: 4  EXAM:  Filed Vitals:   09/13/13 0956  BP: 118/70   Temp: 98.8 F (37.1 C)    Body mass index is 31.57 kg/(m^2).  GENERAL: vitals reviewed and listed above, alert, oriented, appears well hydrated and in no acute distress  HEENT: atraumatic, conjunttiva clear, no obvious abnormalities on inspection of external nose and ears, normal appearance of ear canals and TMs, clear nasal congestion, mild post oropharyngeal erythema with PND, no tonsillar edema or exudate, no sinus TTP  NECK: no obvious masses on inspection  LUNGS: clear to auscultation bilaterally, no wheezes, rales or rhonchi, good air movement  CV: HRRR, no peripheral edema  MS: moves all extremities without noticeable abnormality  PSYCH: pleasant and cooperative, no obvious depression or anxiety  ASSESSMENT AND PLAN:  Discussed the following assessment and plan:  PND (post-nasal drip)  Asthma, mild intermittent  ALLERGIC RHINITIS  Post-viral cough syndrome  -symptoms are mild and are likely post infectious PND and chronic underlying mild AR and asthma with normal lung exam today but PND with cobblestoning -afrin short term and restart INS and symbicort for one month -follow up if worsening and as scheduled -Patient advised to return or notify a doctor immediately if symptoms worsen or persist or new concerns arise.  There are no Patient Instructions on file for this visit.   Colin Benton R.

## 2013-09-14 ENCOUNTER — Other Ambulatory Visit: Payer: Self-pay | Admitting: Internal Medicine

## 2013-10-12 ENCOUNTER — Ambulatory Visit (AMBULATORY_SURGERY_CENTER): Payer: Self-pay

## 2013-10-12 VITALS — Ht 72.0 in | Wt 218.0 lb

## 2013-10-12 DIAGNOSIS — Z860100 Personal history of colon polyps, unspecified: Secondary | ICD-10-CM

## 2013-10-12 MED ORDER — MOVIPREP 100 G PO SOLR: 1.0000 | Freq: Once | ORAL | Status: DC

## 2013-10-15 ENCOUNTER — Other Ambulatory Visit (INDEPENDENT_AMBULATORY_CARE_PROVIDER_SITE_OTHER): Payer: BC Managed Care – PPO

## 2013-10-15 ENCOUNTER — Encounter: Payer: Self-pay | Admitting: Gastroenterology

## 2013-10-15 DIAGNOSIS — Z0000 Encounter for general adult medical examination without abnormal findings: Secondary | ICD-10-CM

## 2013-10-15 LAB — HEPATIC FUNCTION PANEL
ALT: 26 U/L (ref 0–53)
AST: 20 U/L (ref 0–37)
Albumin: 4.1 g/dL (ref 3.5–5.2)
Alkaline Phosphatase: 60 U/L (ref 39–117)
BILIRUBIN TOTAL: 0.7 mg/dL (ref 0.3–1.2)
Bilirubin, Direct: 0.2 mg/dL (ref 0.0–0.3)
Total Protein: 7.2 g/dL (ref 6.0–8.3)

## 2013-10-15 LAB — POCT URINALYSIS DIPSTICK
Blood, UA: NEGATIVE
Glucose, UA: NEGATIVE
Leukocytes, UA: NEGATIVE
Nitrite, UA: NEGATIVE
Protein, UA: NEGATIVE
Spec Grav, UA: >=1.03
Urobilinogen, UA: 0.2
pH, UA: 5.5

## 2013-10-15 LAB — CBC WITH DIFFERENTIAL/PLATELET
BASOS ABS: 0.1 10*3/uL (ref 0.0–0.1)
Basophils Relative: 0.7 % (ref 0.0–3.0)
EOS PCT: 4 % (ref 0.0–5.0)
Eosinophils Absolute: 0.3 10*3/uL (ref 0.0–0.7)
HEMATOCRIT: 42.6 % (ref 39.0–52.0)
Hemoglobin: 14.3 g/dL (ref 13.0–17.0)
Lymphocytes Relative: 24.8 % (ref 12.0–46.0)
Lymphs Abs: 2.1 10*3/uL (ref 0.7–4.0)
MCHC: 33.7 g/dL (ref 30.0–36.0)
MCV: 89.8 fl (ref 78.0–100.0)
MONO ABS: 0.6 10*3/uL (ref 0.1–1.0)
Monocytes Relative: 7.3 % (ref 3.0–12.0)
Neutro Abs: 5.4 10*3/uL (ref 1.4–7.7)
Neutrophils Relative %: 63.2 % (ref 43.0–77.0)
PLATELETS: 266 10*3/uL (ref 150.0–400.0)
RBC: 4.75 Mil/uL (ref 4.22–5.81)
RDW: 13.1 % (ref 11.5–14.6)
WBC: 8.6 10*3/uL (ref 4.5–10.5)

## 2013-10-15 LAB — BASIC METABOLIC PANEL
BUN: 14 mg/dL (ref 6–23)
CHLORIDE: 106 meq/L (ref 96–112)
CO2: 24 meq/L (ref 19–32)
Calcium: 9.5 mg/dL (ref 8.4–10.5)
Creatinine, Ser: 1 mg/dL (ref 0.4–1.5)
GFR: 86.44 mL/min (ref >60.00)
GLUCOSE: 163 mg/dL — AB (ref 70–99)
POTASSIUM: 3.7 meq/L (ref 3.5–5.1)
Sodium: 142 mEq/L (ref 135–145)

## 2013-10-15 LAB — LIPID PANEL
CHOL/HDL RATIO: 3
Cholesterol: 84 mg/dL (ref 0–200)
HDL: 25.8 mg/dL — ABNORMAL LOW (ref >39.00)
LDL Cholesterol: 28 mg/dL (ref 0–99)
Triglycerides: 150 mg/dL — ABNORMAL HIGH (ref 0.0–149.0)
VLDL: 30 mg/dL (ref 0.0–40.0)

## 2013-10-15 LAB — MICROALBUMIN / CREATININE URINE RATIO
Creatinine,U: 345.4 mg/dL
Microalb Creat Ratio: 1.4 mg/g (ref 0.0–30.0)
Microalb, Ur: 4.8 mg/dL — ABNORMAL HIGH (ref 0.0–1.9)

## 2013-10-15 LAB — PSA: PSA: 1.34 ng/mL (ref 0.10–4.00)

## 2013-10-15 LAB — TSH: TSH: 0.48 u[IU]/mL (ref 0.35–5.50)

## 2013-10-15 LAB — HEMOGLOBIN A1C: Hgb A1c MFr Bld: 9.6 % — ABNORMAL HIGH (ref 4.6–6.5)

## 2013-10-22 ENCOUNTER — Ambulatory Visit (INDEPENDENT_AMBULATORY_CARE_PROVIDER_SITE_OTHER): Payer: BC Managed Care – PPO | Admitting: Internal Medicine

## 2013-10-22 ENCOUNTER — Encounter: Payer: Self-pay | Admitting: Internal Medicine

## 2013-10-22 VITALS — BP 140/80 | HR 58 | Temp 98.3°F | Resp 20 | Ht 70.75 in | Wt 219.0 lb

## 2013-10-22 DIAGNOSIS — Z0000 Encounter for general adult medical examination without abnormal findings: Secondary | ICD-10-CM

## 2013-10-22 DIAGNOSIS — E1165 Type 2 diabetes mellitus with hyperglycemia: Secondary | ICD-10-CM

## 2013-10-22 DIAGNOSIS — IMO0001 Reserved for inherently not codable concepts without codable children: Secondary | ICD-10-CM

## 2013-10-22 MED ORDER — CANAGLIFLOZIN-METFORMIN HCL 150-1000 MG PO TABS: 1.0000 | ORAL_TABLET | Freq: Two times a day (BID) | ORAL | Status: DC

## 2013-10-22 NOTE — Progress Notes (Signed)
Subjective:    Patient ID: Larry Garner, male    DOB: 1955-10-18, 58 y.o.   MRN: [id number]  HPI CPX weight loss  Diet reveiwed  lab work reveiwed    Review of Systems  Constitutional: Negative for fever and fatigue.  HENT: Negative for congestion, hearing loss and postnasal drip.   Eyes: Negative for discharge, redness and visual disturbance.  Respiratory: Negative for cough, shortness of breath and wheezing.   Cardiovascular: Negative for leg swelling.  Gastrointestinal: Negative for abdominal pain, constipation and abdominal distention.  Genitourinary: Negative for urgency and frequency.  Musculoskeletal: Negative for arthralgias, joint swelling and neck pain.  Skin: Negative for color change and rash.  Neurological: Negative for weakness and light-headedness.  Hematological: Negative for adenopathy.  Psychiatric/Behavioral: Negative for behavioral problems.   Past Medical History  Diagnosis Date  . Anxiety   . HLD (hyperlipidemia)   . HTN (hypertension)   . History of heart attack   . Allergic rhinitis   . Diabetes   . Asthma     History   Social History  . Marital Status: Married    Spouse Name: N/A    Number of Children: N/A  . Years of Education: N/A   Occupational History  . Banker    Social History Main Topics  . Smoking status: Former Smoker    Quit date: 09/29/1977  . Smokeless tobacco: Never Used     Comment: qiot om 1980  . Alcohol Use: Yes  . Drug Use: No  . Sexual Activity: Yes   Other Topics Concern  . Not on file   Social History Narrative  . No narrative on file    Past Surgical History  Procedure Laterality Date  . 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 Coronary angioplasty      Family History  Problem Relation Age of Onset  . Hypertension Mother   . Alzheimer's disease Mother   . Hyperlipidemia Father   . Hypertension Father   . Colon polyps Father   . Colon cancer Paternal Aunt   . Pancreatic cancer Neg Hx   .  Stomach cancer Neg Hx     Allergies  Allergen Reactions  . Morphine Sulfate     REACTION: nausea    Current Outpatient Prescriptions on File Prior to Visit  Medication Sig Dispense Refill  . ALPRAZolam (XANAX) 0.5 MG tablet TAKE 1 TABLET BY MOUTH EVERY 6 HOURS AS NEEDED  30 tablet  5  . amLODipine (NORVASC) 5 MG tablet TAKE 1 TABLET (5 MG TOTAL) BY MOUTH DAILY.  30 tablet  11  . aspirin 325 MG tablet Take 325 mg by mouth daily.        Marland Kitchen azelastine (ASTELIN) 137 MCG/SPRAY nasal spray Place 2 sprays into the nose 2 (two) times daily. Use in each nostril as directed  30 mL  3  . bisoprolol-hydrochlorothiazide (ZIAC) 10-6.25 MG per tablet TAKE 1 TABLET BY MOUTH EVERY DAY  30 tablet  11  . CRESTOR 20 MG tablet TAKE 1 TABLET BY MOUTH EVERY DAY  30 tablet  11  . diclofenac (CATAFLAM) 50 MG tablet Take 50 mg by mouth 2 (two) times daily as needed. For back pain       . famciclovir (FAMVIR) 500 MG tablet TAKE AS DIRECTED AND NEEDED  10 tablet  1  . fluticasone (FLONASE) 50 MCG/ACT nasal spray Place 2 sprays into the nose daily.  16 g  3  . folic acid (FOLVITE)  800 MCG tablet Take 1,600 mcg by mouth daily.        . Inositol Niacinate (NIACIN FLUSH FREE) 500 MG CAPS Take 1 capsule by mouth daily.        . JENTADUETO 2.12-998 MG TABS TAKE 1 TABLET BY MOUTH TWICE A DAY  60 tablet  11  . l-methylfolate-B6-B12 (METANX) 3-35-2 MG TABS TAKE 1 TABLET EVERY DAY  90 tablet  3  . losartan (COZAAR) 100 MG tablet TAKE 1 TABLET EVERY DAY  90 tablet  3  . MOVIPREP 100 G SOLR Take 1 kit (200 g total) by mouth once.  1 kit  0  . Multiple Vitamin (MULTIVITAMIN) tablet Take 1 tablet by mouth daily.        . nitroGLYCERIN (NITROLINGUAL) 0.4 MG/SPRAY spray USE AS DIRECTED  30 g  2  . omega-3 acid ethyl esters (LOVAZA) 1 G capsule TAKE 1 CAPSULE BY MOUTH 3 TIMES A DAY  90 capsule  5  . pimecrolimus (ELIDEL) 1 % cream Apply topically 2 (two) times daily as needed.        . Pyridoxine HCl (VITAMIN B-6) 500 MG tablet  Take 500 mg by mouth daily.        . SYMBICORT 160-4.5 MCG/ACT inhaler USE 2 PUFFS TWICE A DAY  10.2 g  3  . tadalafil (CIALIS) 20 MG tablet Take 20 mg by mouth as directed.        . zaleplon (SONATA) 10 MG capsule TAKE ONE CAPSULE EVERY DAY AS NEEDED  30 capsule  4   No current facility-administered medications on file prior to visit.    BP 144/80  Pulse 58  Temp(Src) 98.3 F (36.8 C) (Oral)  Resp 20  Ht 5' 10.75" (1.797 m)  Wt 219 lb (99.338 kg)  BMI 30.76 kg/m2       Objective:   Physical Exam  Nursing note and vitals reviewed. Constitutional: He is oriented to person, place, and time. He appears well-developed and well-nourished.  HENT:  Head: Normocephalic and atraumatic.  Eyes: Conjunctivae are normal. Pupils are equal, round, and reactive to light.  Neck: Normal range of motion. Neck supple.  Cardiovascular: Normal rate and regular rhythm.   Pulmonary/Chest: Effort normal and breath sounds normal.  Abdominal: Soft. Bowel sounds are normal.  Genitourinary: Prostate normal.  Musculoskeletal: Normal range of motion.  Neurological: He is alert and oriented to person, place, and time.  Skin: Skin is warm.  Psychiatric: He has a normal mood and affect. His behavior is normal.          Assessment & Plan:  Patient presents for yearly preventative medicine examination. Medicare questionnaire was completed  All immunizations and health maintenance protocols were reviewed with the patient and needed orders were placed.  Appropriate screening laboratory values were ordered for the patient including screening of hyperlipidemia, renal function and hepatic function. If indicated by BPH, a PSA was ordered.  Medication reconciliation,  past medical history, social history, problem list and allergies were reviewed in detail with the patient  Goals were established with regard to weight loss, exercise, and  diet in compliance with medications  End of life planning was  discussed.  a1c in 3 months

## 2013-10-22 NOTE — Addendum Note (Signed)
Addended by: Ricard Dillon on: 10/22/2013 03:28 PM   Modules accepted: Orders

## 2013-10-22 NOTE — Patient Instructions (Signed)
The patient is instructed to continue all medications as prescribed. Schedule followup with check out clerk upon leaving the clinic  

## 2013-10-22 NOTE — Progress Notes (Signed)
Pre-visit discussion using our clinic review tool. No additional management support is needed unless otherwise documented below in the visit note.  

## 2013-10-26 ENCOUNTER — Ambulatory Visit (AMBULATORY_SURGERY_CENTER): Payer: BC Managed Care – PPO | Admitting: Gastroenterology

## 2013-10-26 ENCOUNTER — Encounter: Payer: Self-pay | Admitting: Gastroenterology

## 2013-10-26 ENCOUNTER — Other Ambulatory Visit: Payer: Self-pay | Admitting: Gastroenterology

## 2013-10-26 ENCOUNTER — Telehealth: Payer: Self-pay

## 2013-10-26 VITALS — BP 129/70 | HR 58 | Temp 98.0°F | Resp 20 | Ht 72.0 in | Wt 218.0 lb

## 2013-10-26 DIAGNOSIS — Z86010 Personal history of colonic polyps: Secondary | ICD-10-CM

## 2013-10-26 DIAGNOSIS — D126 Benign neoplasm of colon, unspecified: Secondary | ICD-10-CM

## 2013-10-26 MED ORDER — SODIUM CHLORIDE 0.9 % IV SOLN: 500.0000 mL | INTRAVENOUS | Status: DC

## 2013-10-26 NOTE — Progress Notes (Signed)
Procedure ends, to recovery, report given and VSS. 

## 2013-10-26 NOTE — Op Note (Signed)
[place]  [address]. [place], [address]   COLONOSCOPY PROCEDURE REPORT  PATIENT: Larry Garner, Larry Garner  MR#: [id number] BIRTHDATE: 1955/09/23 , 32  yrs. old GENDER: Male ENDOSCOPIST: Ladene Artist, MD, [facility] PROCEDURE DATE:  10/26/2013 PROCEDURE:   Colonoscopy with biopsy First Screening Colonoscopy - Avg.  risk and is 50 yrs.  old or older - No.  Prior Negative Screening - Now for repeat screening. N/A  History of Adenoma - Now for follow-up colonoscopy & has been > or = to 3 yrs.  Yes hx of adenoma.  Has been 3 or more years since last colonoscopy.  Polyps Removed Today? Yes. ASA CLASS:   Class II INDICATIONS:Patient's personal history of adenomatous colon polyps.  MEDICATIONS: MAC sedation, administered by CRNA and propofol (Diprivan) 200mg  IV DESCRIPTION OF PROCEDURE:   After the risks benefits and alternatives of the procedure were thoroughly explained, informed consent was obtained.  A digital rectal exam revealed no abnormalities of the rectum.   The [id number]  endoscope was introduced through the anus and advanced to the cecum, which was identified by both the appendix and ileocecal valve. No adverse events experienced.   The quality of the prep was excellent, using MoviPrep  The instrument was then slowly withdrawn as the colon was fully examined.  COLON FINDINGS: A sessile polyp measuring 4 mm in size was found in the transverse colon.  A polypectomy was performed with cold forceps.  The resection was complete and the polyp tissue was completely retrieved.  Moderate diverticulosis was noted in the descending and in the sigmoid colon. The colon was otherwise normal.  There was no diverticulosis, inflammation, polyps or cancers unless previously stated.  Retroflexed views revealed small internal hemorrhoids. The time to cecum=2 minutes 20 seconds. Withdrawal time=7 minutes 56 seconds.  The scope was withdrawn and the procedure  completed.  COMPLICATIONS: There were no complications.  ENDOSCOPIC IMPRESSION: 1.   Sessile polyp measuring 4 mm in the transverse colon; polypectomy performed with cold forceps 2.   Moderate diverticulosis in the descending and in the sigmoid colon 3.   Small internal hemorrhoids  RECOMMENDATIONS: 1.  Await pathology results 2.  High fiber diet with liberal fluid intake. 3.  Repeat Colonoscopy in 5 years.  eSigned:  Ladene Artist, MD, [facility] 10/26/2013 8:54 AM

## 2013-10-26 NOTE — Patient Instructions (Signed)
YOU HAD AN ENDOSCOPIC PROCEDURE TODAY AT THE  ENDOSCOPY CENTER: Refer to the procedure report that was given to you for any specific questions about what was found during the examination.  If the procedure report does not answer your questions, please call your gastroenterologist to clarify.  If you requested that your care partner not be given the details of your procedure findings, then the procedure report has been included in a sealed envelope for you to review at your convenience later.  YOU SHOULD EXPECT: Some feelings of bloating in the abdomen. Passage of more gas than usual.  Walking can help get rid of the air that was put into your GI tract during the procedure and reduce the bloating. If you had a lower endoscopy (such as a colonoscopy or flexible sigmoidoscopy) you may notice spotting of blood in your stool or on the toilet paper. If you underwent a bowel prep for your procedure, then you may not have a normal bowel movement for a few days.  DIET: Your first meal following the procedure should be a light meal and then it is ok to progress to your normal diet.  A half-sandwich or bowl of soup is an example of a good first meal.  Heavy or fried foods are harder to digest and may make you feel nauseous or bloated.  Likewise meals heavy in dairy and vegetables can cause extra gas to form and this can also increase the bloating.  Drink plenty of fluids but you should avoid alcoholic beverages for 24 hours.  ACTIVITY: Your care partner should take you home directly after the procedure.  You should plan to take it easy, moving slowly for the rest of the day.  You can resume normal activity the day after the procedure however you should NOT DRIVE or use heavy machinery for 24 hours (because of the sedation medicines used during the test).    SYMPTOMS TO REPORT IMMEDIATELY: A gastroenterologist can be reached at any hour.  During normal business hours, 8:30 AM to 5:00 PM Monday through Friday,  call [phone].  After hours and on weekends, please call the GI answering service at [phone] who will take a message and have the physician on call contact you.   Following lower endoscopy (colonoscopy or flexible sigmoidoscopy):  Excessive amounts of blood in the stool  Significant tenderness or worsening of abdominal pains  Swelling of the abdomen that is new, acute  Fever of 100F or higher  FOLLOW UP: If any biopsies were taken you will be contacted by phone or by letter within the next 1-3 weeks.  Call your gastroenterologist if you have not heard about the biopsies in 3 weeks.  Our staff will call the home number listed on your records the next business day following your procedure to check on you and address any questions or concerns that you may have at that time regarding the information given to you following your procedure. This is a courtesy call and so if there is no answer at the home number and we have not heard from you through the emergency physician on call, we will assume that you have returned to your regular daily activities without incident.  SIGNATURES/CONFIDENTIALITY: You and/or your care partner have signed paperwork which will be entered into your electronic medical record.  These signatures attest to the fact that that the information above on your After Visit Summary has been reviewed and is understood.  Full responsibility of the confidentiality of this   discharge information lies with you and/or your care-partner.  Polyps, diverticulosis, high fiber diet, hemorrhoids-handouts given  Repeat colonoscopy in 5 years.  Wait biopsy results.

## 2013-10-26 NOTE — Progress Notes (Signed)
Called to room to assist during endoscopic procedure.  Patient ID and intended procedure confirmed with present staff. Received instructions for my participation in the procedure from the performing physician.  

## 2013-10-26 NOTE — Telephone Encounter (Signed)
Relevant patient education assigned to patient using Emmi. ° °

## 2013-10-29 ENCOUNTER — Telehealth: Payer: Self-pay | Admitting: *Deleted

## 2013-10-29 NOTE — Telephone Encounter (Signed)
Message left

## 2013-11-01 ENCOUNTER — Other Ambulatory Visit: Payer: Self-pay | Admitting: Internal Medicine

## 2013-11-01 ENCOUNTER — Encounter: Payer: Self-pay | Admitting: Gastroenterology

## 2013-11-01 ENCOUNTER — Encounter: Payer: Self-pay | Admitting: Internal Medicine

## 2013-11-08 ENCOUNTER — Encounter: Payer: Self-pay | Admitting: Family Medicine

## 2013-11-08 ENCOUNTER — Ambulatory Visit (INDEPENDENT_AMBULATORY_CARE_PROVIDER_SITE_OTHER): Payer: BC Managed Care – PPO | Admitting: Family Medicine

## 2013-11-08 VITALS — BP 132/72 | HR 58 | Temp 98.4°F | Wt 224.0 lb

## 2013-11-08 DIAGNOSIS — IMO0002 Reserved for concepts with insufficient information to code with codable children: Secondary | ICD-10-CM

## 2013-11-08 DIAGNOSIS — M5416 Radiculopathy, lumbar region: Secondary | ICD-10-CM

## 2013-11-08 NOTE — Progress Notes (Signed)
Pre visit review using our clinic review tool, if applicable. No additional management support is needed unless otherwise documented below in the visit note. 

## 2013-11-08 NOTE — Progress Notes (Signed)
   Subjective:    Patient ID: Larry Garner, male    DOB: February 21, 1956, 58 y.o.   MRN: [id number]  Back Pain Pertinent negatives include no abdominal pain, chest pain, dysuria, fever, numbness or weakness.   Patient is seen with right lumbar back pain. Onset a couple days ago. No known injury. He describes a relatively constant pain which is somewhat dull occasionally sharp. Relatively mild 3/10 intensity. Radiates into the right buttock and occasionally down toward the knee. No urine or stool incontinence. No numbness or weakness. He's tried Tylenol and massage with minimal improvement. Sitting makes his pain worse. No hip pain. No appetite or weight changes. No fevers or chills.  Has chronic medical problems including type 2 diabetes, history of CAD, hypertension, dyslipidemia  Past Medical History  Diagnosis Date  . Anxiety   . HLD (hyperlipidemia)   . HTN (hypertension)   . History of heart attack   . Allergic rhinitis   . Diabetes   . Asthma    Past Surgical History  Procedure Laterality Date  . 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 Coronary angioplasty      reports that he quit smoking about 36 years ago. He has never used smokeless tobacco. He reports that he drinks alcohol. He reports that he does not use illicit drugs. family history includes Alzheimer's disease in his mother; Colon cancer in his paternal aunt; Colon polyps in his father; Hyperlipidemia in his father; Hypertension in his father and mother. There is no history of Pancreatic cancer or Stomach cancer. Allergies  Allergen Reactions  . Morphine Sulfate     REACTION: nausea      Review of Systems  Constitutional: Negative for fever, activity change and appetite change.  Respiratory: Negative for cough and shortness of breath.   Cardiovascular: Negative for chest pain and leg swelling.  Gastrointestinal: Negative for vomiting and abdominal pain.  Genitourinary: Negative for dysuria, hematuria and flank  pain.  Musculoskeletal: Positive for back pain. Negative for joint swelling.  Neurological: Negative for weakness and numbness.       Objective:   Physical Exam  Constitutional: He appears well-developed and well-nourished. No distress.  Cardiovascular: Normal rate.   Pulmonary/Chest: Effort normal and breath sounds normal. No respiratory distress. He has no wheezes. He has no rales.  Musculoskeletal:  Straight leg raise on the right reproduces low back pain Full range of motion right hip. No leg edema  Neurological:  Full-strength lower extremities. Symmetric reflexes.      Assessment & Plan:  Right lumbar radiculopathy pain. Nonfocal exam neurologically. Relatively mild pain at this point. Would recommend observation. Avoid steroids with his history of type 2 diabetes. Avoid regular use of nonsteroidals. Followup for any numbness, progressive pain, or weakness

## 2013-11-08 NOTE — Patient Instructions (Signed)

## 2013-11-12 ENCOUNTER — Encounter: Payer: Self-pay | Admitting: Internal Medicine

## 2013-11-12 MED ORDER — AZELASTINE HCL 0.1 % NA SOLN: 2.0000 | Freq: Two times a day (BID) | NASAL | Status: DC

## 2013-11-12 MED ORDER — FLUTICASONE PROPIONATE 50 MCG/ACT NA SUSP: 2.0000 | Freq: Every day | NASAL | Status: DC

## 2013-11-13 ENCOUNTER — Encounter: Payer: Self-pay | Admitting: Internal Medicine

## 2013-12-01 ENCOUNTER — Other Ambulatory Visit: Payer: Self-pay | Admitting: Internal Medicine

## 2013-12-13 ENCOUNTER — Other Ambulatory Visit: Payer: Self-pay | Admitting: Internal Medicine

## 2014-01-15 ENCOUNTER — Other Ambulatory Visit: Payer: Self-pay | Admitting: Internal Medicine

## 2014-01-27 ENCOUNTER — Encounter: Payer: Self-pay | Admitting: Internal Medicine

## 2014-01-28 ENCOUNTER — Other Ambulatory Visit (INDEPENDENT_AMBULATORY_CARE_PROVIDER_SITE_OTHER): Payer: BC Managed Care – PPO

## 2014-01-28 DIAGNOSIS — IMO0001 Reserved for inherently not codable concepts without codable children: Secondary | ICD-10-CM

## 2014-01-28 DIAGNOSIS — E1165 Type 2 diabetes mellitus with hyperglycemia: Principal | ICD-10-CM

## 2014-01-28 LAB — COMPREHENSIVE METABOLIC PANEL
ALT: 26 U/L (ref 0–53)
AST: 19 U/L (ref 0–37)
Albumin: 4.5 g/dL (ref 3.5–5.2)
Alkaline Phosphatase: 69 U/L (ref 39–117)
BUN: 17 mg/dL (ref 6–23)
CALCIUM: 9.8 mg/dL (ref 8.4–10.5)
CHLORIDE: 105 meq/L (ref 96–112)
CO2: 26 meq/L (ref 19–32)
Creatinine, Ser: 1 mg/dL (ref 0.4–1.5)
GFR: 81.39 mL/min (ref >60.00)
Glucose, Bld: 114 mg/dL — ABNORMAL HIGH (ref 70–99)
Potassium: 3.9 mEq/L (ref 3.5–5.1)
SODIUM: 140 meq/L (ref 135–145)
Total Bilirubin: 1 mg/dL (ref 0.2–1.2)
Total Protein: 7.6 g/dL (ref 6.0–8.3)

## 2014-01-28 LAB — HEMOGLOBIN A1C: Hgb A1c MFr Bld: 8.1 % — ABNORMAL HIGH (ref 4.6–6.5)

## 2014-02-04 NOTE — Telephone Encounter (Signed)
Pls advise on labs.

## 2014-03-05 ENCOUNTER — Other Ambulatory Visit: Payer: Self-pay | Admitting: Internal Medicine

## 2014-03-08 ENCOUNTER — Other Ambulatory Visit: Payer: Self-pay | Admitting: Internal Medicine

## 2014-04-16 ENCOUNTER — Other Ambulatory Visit: Payer: BC Managed Care – PPO

## 2014-04-24 ENCOUNTER — Ambulatory Visit: Payer: BC Managed Care – PPO | Admitting: Internal Medicine

## 2014-05-03 ENCOUNTER — Other Ambulatory Visit: Payer: Self-pay | Admitting: Internal Medicine

## 2014-05-04 ENCOUNTER — Encounter (HOSPITAL_COMMUNITY): Payer: Self-pay | Admitting: Emergency Medicine

## 2014-05-04 ENCOUNTER — Emergency Department (HOSPITAL_COMMUNITY)
Admission: EM | Admit: 2014-05-04 | Discharge: 2014-05-04 | Disposition: A | Discharge status: Home / Self Care | Payer: BC Managed Care – PPO | Attending: Emergency Medicine | Admitting: Emergency Medicine

## 2014-05-04 DIAGNOSIS — Z860100 Personal history of colon polyps, unspecified: Secondary | ICD-10-CM | POA: Insufficient documentation

## 2014-05-04 DIAGNOSIS — F411 Generalized anxiety disorder: Secondary | ICD-10-CM | POA: Insufficient documentation

## 2014-05-04 DIAGNOSIS — Z87891 Personal history of nicotine dependence: Secondary | ICD-10-CM | POA: Insufficient documentation

## 2014-05-04 DIAGNOSIS — Z7982 Long term (current) use of aspirin: Secondary | ICD-10-CM | POA: Insufficient documentation

## 2014-05-04 DIAGNOSIS — J45909 Unspecified asthma, uncomplicated: Secondary | ICD-10-CM | POA: Insufficient documentation

## 2014-05-04 DIAGNOSIS — IMO0002 Reserved for concepts with insufficient information to code with codable children: Secondary | ICD-10-CM | POA: Diagnosis not present

## 2014-05-04 DIAGNOSIS — K5792 Diverticulitis of intestine, part unspecified, without perforation or abscess without bleeding: Secondary | ICD-10-CM

## 2014-05-04 DIAGNOSIS — K5732 Diverticulitis of large intestine without perforation or abscess without bleeding: Secondary | ICD-10-CM | POA: Insufficient documentation

## 2014-05-04 DIAGNOSIS — E119 Type 2 diabetes mellitus without complications: Secondary | ICD-10-CM | POA: Insufficient documentation

## 2014-05-04 DIAGNOSIS — R1032 Left lower quadrant pain: Secondary | ICD-10-CM | POA: Diagnosis present

## 2014-05-04 DIAGNOSIS — E785 Hyperlipidemia, unspecified: Secondary | ICD-10-CM | POA: Diagnosis not present

## 2014-05-04 DIAGNOSIS — I252 Old myocardial infarction: Secondary | ICD-10-CM | POA: Insufficient documentation

## 2014-05-04 DIAGNOSIS — Z79899 Other long term (current) drug therapy: Secondary | ICD-10-CM | POA: Diagnosis not present

## 2014-05-04 DIAGNOSIS — Z9889 Other specified postprocedural states: Secondary | ICD-10-CM | POA: Diagnosis not present

## 2014-05-04 LAB — COMPREHENSIVE METABOLIC PANEL
ALT: 17 U/L (ref 0–53)
ANION GAP: 15 (ref 5–15)
AST: 14 U/L (ref 0–37)
Albumin: 3.7 g/dL (ref 3.5–5.2)
Alkaline Phosphatase: 74 U/L (ref 39–117)
BILIRUBIN TOTAL: 0.7 mg/dL (ref 0.3–1.2)
BUN: 12 mg/dL (ref 6–23)
CO2: 23 meq/L (ref 19–32)
CREATININE: 0.84 mg/dL (ref 0.50–1.35)
Calcium: 9.2 mg/dL (ref 8.4–10.5)
Chloride: 103 mEq/L (ref 96–112)
GLUCOSE: 146 mg/dL — AB (ref 70–99)
Potassium: 3.7 mEq/L (ref 3.7–5.3)
Sodium: 141 mEq/L (ref 137–147)
Total Protein: 6.9 g/dL (ref 6.0–8.3)

## 2014-05-04 LAB — CBC
HCT: 42 % (ref 39.0–52.0)
Hemoglobin: 15 g/dL (ref 13.0–17.0)
MCH: 30.5 pg (ref 26.0–34.0)
MCHC: 35.7 g/dL (ref 30.0–36.0)
MCV: 85.5 fL (ref 78.0–100.0)
Platelets: 236 10*3/uL (ref 150–400)
RBC: 4.91 MIL/uL (ref 4.22–5.81)
RDW: 12.9 % (ref 11.5–15.5)
WBC: 12.5 10*3/uL — ABNORMAL HIGH (ref 4.0–10.5)

## 2014-05-04 MED ORDER — CIPROFLOXACIN HCL 500 MG PO TABS: 500.0000 mg | ORAL_TABLET | Freq: Two times a day (BID) | ORAL | Status: DC

## 2014-05-04 MED ORDER — METRONIDAZOLE 500 MG PO TABS: 500.0000 mg | ORAL_TABLET | Freq: Two times a day (BID) | ORAL | Status: DC

## 2014-05-04 MED ORDER — HYDROCODONE-ACETAMINOPHEN 5-325 MG PO TABS: 1.0000 | ORAL_TABLET | ORAL | Status: DC | PRN

## 2014-05-04 NOTE — ED Provider Notes (Signed)
CSN: [id number]     Arrival date & time 05/04/14  7902 History   First MD Initiated Contact with Patient 05/04/14 [phone]     Chief Complaint  Patient presents with  . Abdominal Pain     (Consider location/radiation/quality/duration/timing/severity/associated sxs/prior Treatment) HPI Comments: Pt is a 58 y/o male with a PMHx of anxiety, hyperlipidemia, hypertension, diabetes, asthma and diverticulitis who presents to the emergency department complaining of intermittent lower abdominal pain x1 day beginning yesterday evening. Pain described as cramping, beginning in his left lower quadrant radiating across his lower abdomen towards the right. Nothing in specific makes the pain come or go. Yesterday he had a normal appetite, however did not eat breakfast this morning. Admits to subjective low-grade fevers. Denies nausea, vomiting, diarrhea, bloody stool. He had a normal bowel movement yesterday evening which was normal. States 5-6 years ago he had a flare of diverticulitis and this feels exactly the same. Last colonoscopy was in March 2015 with one colon polyp that was removed and diverticulosis. Currently states his pain is "not bad".  Patient is a 59 y.o. male presenting with abdominal pain. The history is provided by the patient.  Abdominal Pain Associated symptoms: fever     Past Medical History  Diagnosis Date  . Anxiety   . HLD (hyperlipidemia)   . HTN (hypertension)   . History of heart attack   . Allergic rhinitis   . Diabetes   . Asthma    Past Surgical History  Procedure Laterality Date  . 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 Coronary angioplasty     Family History  Problem Relation Age of Onset  . Hypertension Mother   . Alzheimer's disease Mother   . Hyperlipidemia Father   . Hypertension Father   . Colon polyps Father   . Colon cancer Paternal Aunt   . Pancreatic cancer Neg Hx   . Stomach cancer Neg Hx    History  Substance Use Topics  . Smoking status: Former  Smoker    Quit date: 09/29/1977  . Smokeless tobacco: Never Used     Comment: qiot om 1980  . Alcohol Use: Yes    Review of Systems  Constitutional: Positive for fever.  Gastrointestinal: Positive for abdominal pain.  All other systems reviewed and are negative.     Allergies  Morphine sulfate  Home Medications   Prior to Admission medications   Medication Sig Start Date End Date Taking? Authorizing Provider  ALPRAZolam Duanne Moron) 0.5 MG tablet Take 0.5 mg by mouth every 6 (six) hours as needed for anxiety.   Yes Historical Provider, MD  amLODipine (NORVASC) 5 MG tablet Take 5 mg by mouth daily.   Yes Historical Provider, MD  aspirin 325 MG tablet Take 325 mg by mouth daily.     Yes Historical Provider, MD  azelastine (ASTELIN) 137 MCG/SPRAY nasal spray Place 2 sprays into both nostrils 2 (two) times daily. Use in each nostril as directed 11/12/13  Yes Ricard Dillon, MD  bisoprolol-hydrochlorothiazide [facility]) 10-6.25 MG per tablet Take 1 tablet by mouth daily.   Yes Historical Provider, MD  budesonide-formoterol (SYMBICORT) 160-4.5 MCG/ACT inhaler Inhale 2 puffs into the lungs 2 (two) times daily.   Yes Historical Provider, MD  Canagliflozin-Metformin HCl (INVOKAMET) [phone] MG TABS Take 1 tablet by mouth 2 (two) times daily. 10/22/13  Yes Ricard Dillon, MD  diclofenac (CATAFLAM) 50 MG tablet Take 50 mg by mouth 2 (two) times daily as needed. For back pain  Yes Historical Provider, MD  fluticasone (FLONASE) 50 MCG/ACT nasal spray Place 2 sprays into both nostrils daily. 11/12/13  Yes Ricard Dillon, MD  folic acid (FOLVITE) 160 MCG tablet Take 1,600 mcg by mouth daily.     Yes Historical Provider, MD  l-methylfolate-B6-B12 (METANX) 3-35-2 MG TABS Take 1 tablet by mouth daily.   Yes Historical Provider, MD  losartan (COZAAR) 100 MG tablet Take 100 mg by mouth daily.   Yes Historical Provider, MD  Multiple Vitamin (MULTIVITAMIN) tablet Take 1 tablet by mouth daily.     Yes Historical  Provider, MD  nitroGLYCERIN (NITROSTAT) 0.4 MG SL tablet Place 0.4 mg under the tongue every 5 (five) minutes as needed for chest pain.   Yes Historical Provider, MD  omega-3 acid ethyl esters (LOVAZA) 1 G capsule Take 1 g by mouth 3 (three) times daily.   Yes Historical Provider, MD  Pyridoxine HCl (VITAMIN B-6) 500 MG tablet Take 500 mg by mouth daily.     Yes Historical Provider, MD  rosuvastatin (CRESTOR) 20 MG tablet Take 20 mg by mouth daily.   Yes Historical Provider, MD  zaleplon (SONATA) 10 MG capsule Take 10 mg by mouth at bedtime as needed for sleep.   Yes Historical Provider, MD  ciprofloxacin (CIPRO) 500 MG tablet Take 1 tablet (500 mg total) by mouth 2 (two) times daily. One po bid x 7 days 05/04/14   Illene Labrador, PA-C  HYDROcodone-acetaminophen (NORCO/VICODIN) 5-325 MG per tablet Take 1-2 tablets by mouth every 4 (four) hours as needed. 05/04/14   Illene Labrador, PA-C  Inositol Niacinate (NIACIN FLUSH FREE) 500 MG CAPS Take 1 capsule by mouth daily.      Historical Provider, MD  metroNIDAZOLE (FLAGYL) 500 MG tablet Take 1 tablet (500 mg total) by mouth 2 (two) times daily. One po bid x 7 days 05/04/14   Illene Labrador, PA-C   BP 145/75  Pulse 65  Temp(Src) 99.8 F (37.7 C) (Oral)  Resp 18  Ht 5\' 11"  (1.803 m)  Wt 211 lb (95.709 kg)  BMI 29.44 kg/m2  SpO2 96% Physical Exam  Nursing note and vitals reviewed. Constitutional: He is oriented to person, place, and time. He appears well-developed and well-nourished. No distress.  HENT:  Head: Normocephalic and atraumatic.  Mouth/Throat: Oropharynx is clear and moist.  Eyes: Conjunctivae are normal.  Neck: Normal range of motion. Neck supple.  Cardiovascular: Normal rate, regular rhythm and normal heart sounds.   Pulmonary/Chest: Effort normal and breath sounds normal.  Abdominal: Soft. Normal appearance and bowel sounds are normal. He exhibits no distension. There is tenderness in the left lower quadrant. There is no rigidity,  no rebound and no guarding.  No peritoneal signs.  Musculoskeletal: Normal range of motion. He exhibits no edema.  Neurological: He is alert and oriented to person, place, and time.  Skin: Skin is warm and dry. He is not diaphoretic.  Psychiatric: He has a normal mood and affect. His behavior is normal.    ED Course  Procedures (including critical care time) Labs Review Labs Reviewed  CBC - Abnormal; Notable for the following:    WBC 12.5 (*)    All other components within normal limits  COMPREHENSIVE METABOLIC PANEL - Abnormal; Notable for the following:    Glucose, Bld 146 (*)    All other components within normal limits    Imaging Review No results found.   EKG Interpretation None      MDM  Final diagnoses:  Acute diverticulitis   Pt presenting with abdominal pain. He is non-toxic appearing and in NAD, stating he does not need anything for pain at this time. Temperature 99.8, vital signs stable. No associated symptoms at this point. Abdomen is tender in the left lower contrast without guarding, no peritoneal signs. Leukocytosis of 12.5. Labs otherwise without any abnormalities. Pt will be discharged with cipro/flagyl for diverticulitis. F/u with PCP. Return precautions given. Patient states understanding of treatment care plan and is agreeable.    Illene Labrador, PA-C 05/04/14 [phone]

## 2014-05-04 NOTE — ED Notes (Signed)
Patient presents stating that he has been having lower abd cramping that comes and goes.  History of diverticulosis.  No bloody stools, no diahrrea.

## 2014-05-04 NOTE — Discharge Instructions (Signed)
Take both antibiotics to completion. Take Vicodin for severe pain only. No driving or operating heavy machinery while taking vicodin. This medication may cause drowsiness.  Diverticulitis Diverticulitis is inflammation or infection of small pouches in your colon that form when you have a condition called diverticulosis. The pouches in your colon are called diverticula. Your colon, or large intestine, is where water is absorbed and stool is formed. Complications of diverticulitis can include:  Bleeding.  Severe infection.  Severe pain.  Perforation of your colon.  Obstruction of your colon. CAUSES  Diverticulitis is caused by bacteria. Diverticulitis happens when stool becomes trapped in diverticula. This allows bacteria to grow in the diverticula, which can lead to inflammation and infection. RISK FACTORS People with diverticulosis are at risk for diverticulitis. Eating a diet that does not include enough fiber from fruits and vegetables may make diverticulitis more likely to develop. SYMPTOMS  Symptoms of diverticulitis may include:  Abdominal pain and tenderness. The pain is normally located on the left side of the abdomen, but may occur in other areas.  Fever and chills.  Bloating.  Cramping.  Nausea.  Vomiting.  Constipation.  Diarrhea.  Blood in your stool. DIAGNOSIS  Your health care provider will ask you about your medical history and do a physical exam. You may need to have tests done because many medical conditions can cause the same symptoms as diverticulitis. Tests may include:  Blood tests.  Urine tests.  Imaging tests of the abdomen, including X-rays and CT scans. When your condition is under control, your health care provider may recommend that you have a colonoscopy. A colonoscopy can show how severe your diverticula are and whether something else is causing your symptoms. TREATMENT  Most cases of diverticulitis are mild and can be treated at home.  Treatment may include:  Taking over-the-counter pain medicines.  Following a clear liquid diet.  Taking antibiotic medicines by mouth for 7-10 days. More severe cases may be treated at a hospital. Treatment may include:  Not eating or drinking.  Taking prescription pain medicine.  Receiving antibiotic medicines through an IV tube.  Receiving fluids and nutrition through an IV tube.  Surgery. HOME CARE INSTRUCTIONS   Follow your health care provider's instructions carefully.  Follow a full liquid diet or other diet as directed by your health care provider. After your symptoms improve, your health care provider may tell you to change your diet. He or she may recommend you eat a high-fiber diet. Fruits and vegetables are good sources of fiber. Fiber makes it easier to pass stool.  Take fiber supplements or probiotics as directed by your health care provider.  Only take medicines as directed by your health care provider.  Keep all your follow-up appointments. SEEK MEDICAL CARE IF:   Your pain does not improve.  You have a hard time eating food.  Your bowel movements do not return to normal. SEEK IMMEDIATE MEDICAL CARE IF:   Your pain becomes worse.  Your symptoms do not get better.  Your symptoms suddenly get worse.  You have a fever.  You have repeated vomiting.  You have bloody or black, tarry stools. MAKE SURE YOU:   Understand these instructions.  Will watch your condition.  Will get help right away if you are not doing well or get worse. Document Released: 05/05/2005 Document Revised: 07/31/2013 Document Reviewed: 06/20/2013 [facility] Patient Information 2015 [place], [place]. This information is not intended to replace advice given to you by your health care provider. Make  sure you discuss any questions you have with your health care provider.

## 2014-05-04 NOTE — ED Notes (Signed)
Patient arrives with complaint of abdominal pain which started last night. Reports history of diverticulosis and explains that this feels like typical flare. Currently endorses lower abdominal pain which he describes as cramping and intermittent. Denies fever, bleeding, nausea, and vomiting. States last BM yesterday and it was normal.

## 2014-05-05 NOTE — ED Provider Notes (Signed)
Medical screening examination/treatment/procedure(s) were performed by non-physician practitioner and as supervising physician I was immediately available for consultation/collaboration.   EKG Interpretation None        Tanna Furry, MD 05/05/14 1521

## 2014-05-06 NOTE — Telephone Encounter (Signed)
Yes, may give 1 month rx. Please inform patient that I do not prescribe this medication on a daily basis for people that are not on first line therapy for anxiety or are not receiving counseling. Please let him know that my plan would be to wean him off of the medication if he chooses to continue with me as his primary care provider. He also has the option to look for other providers if he prefers to not change his current regimen (none currently accepting in our practice that prescribe this medication though).

## 2014-05-06 NOTE — Telephone Encounter (Signed)
Pt notified and Rx called in

## 2014-05-06 NOTE — Telephone Encounter (Signed)
Is this ok to refill?  

## 2014-05-27 ENCOUNTER — Encounter: Payer: Self-pay | Admitting: Family Medicine

## 2014-05-27 ENCOUNTER — Ambulatory Visit (INDEPENDENT_AMBULATORY_CARE_PROVIDER_SITE_OTHER): Payer: BC Managed Care – PPO | Admitting: Family Medicine

## 2014-05-27 VITALS — BP 122/70 | HR 54 | Temp 97.8°F | Wt 212.0 lb

## 2014-05-27 DIAGNOSIS — E1165 Type 2 diabetes mellitus with hyperglycemia: Secondary | ICD-10-CM

## 2014-05-27 DIAGNOSIS — E785 Hyperlipidemia, unspecified: Secondary | ICD-10-CM

## 2014-05-27 DIAGNOSIS — G4700 Insomnia, unspecified: Secondary | ICD-10-CM | POA: Insufficient documentation

## 2014-05-27 DIAGNOSIS — I10 Essential (primary) hypertension: Secondary | ICD-10-CM

## 2014-05-27 DIAGNOSIS — I252 Old myocardial infarction: Secondary | ICD-10-CM

## 2014-05-27 DIAGNOSIS — I2510 Atherosclerotic heart disease of native coronary artery without angina pectoris: Secondary | ICD-10-CM

## 2014-05-27 DIAGNOSIS — Z23 Encounter for immunization: Secondary | ICD-10-CM

## 2014-05-27 LAB — HEMOGLOBIN A1C: HEMOGLOBIN A1C: 7.6 % — AB (ref 4.6–6.5)

## 2014-05-27 NOTE — Progress Notes (Signed)
Larry Reddish, MD Phone: [phone]  Subjective:  Patient presents today to establish care with me as their new primary care provider. Patient was formerly a patient of [name]. Chief complaint-noted.   DIABETES Type II-improving control but not at goal of less than 7 Lab Results  Component Value Date   HGBA1C 7.6* 05/27/2014   HGBA1C 8.1* 01/28/2014   HGBA1C 9.6* 10/15/2013  Medications taking and tolerating-Invokana and metformin  Blood Sugars per patient-fasting-does not check regularly Regular Exercise-weight loss likely due to Invokana, advised needs regular exercise On Aspirin-yes On statin-yes Daily foot monitoring-yes Health Maintenance plan next visit   Topic Date Due  . Pneumococcal Polysaccharide Vaccine (##1) 06/14/1957  . Foot Exam  06/14/1965   ROS- Denies  Vision changes, feet or hand numbness/pain/tingling. Denies Hypoglycemia symptoms (shaky, sweaty, hungry, weak anxious, tremor, palpitations, confusion, behavior change).    Hypertension-well-controlled with 4 meds in combination BP Readings from Last 3 Encounters:  05/27/14 122/70  05/04/14 127/74  11/08/13 132/72  Compliant with medications-yes without side effects ROS-Denies any CP, HA, SOB, blurry vision, LE edema.  CAD-well-controlled, asymptomatic Hyperlipidemia-well-controlled Lab Results  Component Value Date   LDLCALC 28 10/15/2013  On statin: Yes Crestor Regular exercise: No, advised ROS- no chest pain or shortness of breath. No myalgias  The following were reviewed and entered/updated in epic: Past Medical History  Diagnosis Date  . Anxiety   . HLD (hyperlipidemia)   . HTN (hypertension)   . History of heart attack   . Allergic rhinitis   . Diabetes   . Asthma    Patient Active Problem List   Diagnosis Date Noted  . Poorly controlled diabetes mellitus 03/31/2010    Priority: High  . CAD (coronary artery disease) s/p CABG 04/06/2007    Priority: High  . Insomnia 05/27/2014   Priority: Medium  . Asthma, mild intermittent 06/19/2008    Priority: Medium  . ERECTILE DYSFUNCTION, SECONDARY TO MEDICATION 05/17/2007    Priority: Medium  . Hyperlipidemia 04/06/2007    Priority: Medium  . Anxiety state 04/06/2007    Priority: Medium  . Essential hypertension 04/06/2007    Priority: Medium  . Plantar wart of left foot 11/27/2010    Priority: Low  . PSA, INCREASED 09/15/2009    Priority: Low  . Diverticulitis of colon 05/08/2009    Priority: Low  . ANKLE PAIN, CHRONIC 06/05/2007    Priority: Low  . Allergic rhinitis 05/17/2007    Priority: Low   Past Surgical History  Procedure Laterality Date  . 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 Coronary angioplasty      Family History  Problem Relation Age of Onset  . Hypertension Mother   . Alzheimer's disease Mother   . Hyperlipidemia Father   . Hypertension Father   . Colon polyps Father   . Colon cancer Paternal Aunt   . Pancreatic cancer Neg Hx   . Stomach cancer Neg Hx     Medications- reviewed and updated Current Outpatient Prescriptions  Medication Sig Dispense Refill  . amLODipine (NORVASC) 5 MG tablet Take 5 mg by mouth daily.      Marland Kitchen aspirin 325 MG tablet Take 325 mg by mouth daily.        . bisoprolol-hydrochlorothiazide (ZIAC) 10-6.25 MG per tablet Take 1 tablet by mouth daily.      . Canagliflozin-Metformin HCl (INVOKAMET) [id number] MG TABS Take 1 tablet by mouth 2 (two) times daily.  60 tablet  11  . diclofenac (CATAFLAM) 50  MG tablet Take 50 mg by mouth 2 (two) times daily as needed. For back pain       . folic acid (FOLVITE) 474 MCG tablet Take 1,600 mcg by mouth daily.        . Inositol Niacinate (NIACIN FLUSH FREE) 500 MG CAPS Take 1 capsule by mouth daily.        Marland Kitchen l-methylfolate-B6-B12 (METANX) 3-35-2 MG TABS Take 1 tablet by mouth daily.      Marland Kitchen losartan (COZAAR) 100 MG tablet Take 100 mg by mouth daily.      . Multiple Vitamin (MULTIVITAMIN) tablet Take 1 tablet by mouth daily.          Marland Kitchen omega-3 acid ethyl esters (LOVAZA) 1 G capsule Take 1 g by mouth 3 (three) times daily.      . Pyridoxine HCl (VITAMIN B-6) 500 MG tablet Take 500 mg by mouth daily.        . rosuvastatin (CRESTOR) 20 MG tablet Take 20 mg by mouth daily.      . zaleplon (SONATA) 10 MG capsule Take 10 mg by mouth at bedtime as needed for sleep.      Marland Kitchen ALPRAZolam (XANAX) 0.5 MG tablet TAKE 1 TABLET BY MOUTH EVERY 6 HOURS AS NEEDED  30 tablet  1  . azelastine (ASTELIN) 137 MCG/SPRAY nasal spray Place 2 sprays into both nostrils 2 (two) times daily. Use in each nostril as directed  30 mL  0  . budesonide-formoterol (SYMBICORT) 160-4.5 MCG/ACT inhaler Inhale 2 puffs into the lungs 2 (two) times daily.      . fluticasone (FLONASE) 50 MCG/ACT nasal spray Place 2 sprays into both nostrils daily.  16 g  0  . nitroGLYCERIN (NITROSTAT) 0.4 MG SL tablet Place 0.4 mg under the tongue every 5 (five) minutes as needed for chest pain.       No current facility-administered medications for this visit.    Allergies-reviewed and updated Allergies  Allergen Reactions  . Morphine Sulfate     REACTION: nausea    History   Social History  . Marital Status: Married    Spouse Name: N/A    Number of Children: N/A  . Years of Education: N/A   Occupational History  . Banker    Social History Main Topics  . Smoking status: Former Smoker -- 0.50 packs/day for 4 years    Types: Cigarettes    Quit date: 09/29/1977  . Smokeless tobacco: Never Used     Comment: qiot om 1980  . Alcohol Use: 0.5 oz/week    1 drink(s) per week  . Drug Use: No  . Sexual Activity: Yes   Other Topics Concern  . None   Social History Narrative   Married  In 1981 with 2 kids (son and daughter). No grandkids.       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)      Hobbies: active in church, sings for church, travel    ROS--See HPI   Objective: BP 122/70  Pulse 54  Temp(Src) 97.8 F (36.6 C)  Wt 212 lb (96.163 kg) Gen: NAD, resting  comfortably in chair CV: bradycardic Lungs: nonlabored Skin: warm, dry, no rash Neuro: grossly normal, moves all extremities   Assessment/Plan:  Essential hypertension Well-controlled on amlodipine, losartan, bisoprolol, HCTZ  CAD (coronary artery disease) s/p CABG Continue medical management with aspirin, statin, BP control  Poorly controlled diabetes mellitus Improved control to 7.6 from 8.1 switching from metformin and sitagliptin to metformin and Invokana. Advised  need to add exercise and hopefully can reach A1c goal of 7 with current regimen and continued weight loss  Hyperlipidemia Continue Crestor as well controlled

## 2014-05-27 NOTE — Assessment & Plan Note (Signed)
Well-controlled on amlodipine, losartan, bisoprolol, HCTZ

## 2014-05-27 NOTE — Assessment & Plan Note (Signed)
Continue Crestor as well controlled

## 2014-05-27 NOTE — Patient Instructions (Addendum)
Great to meet you!   A1c today.   Overall it seems things are going pretty well except want to get your a1c below 7. Would consider adding januvia but I am thrilled by the weight loss so keep up the good work!   No changes until we get bloodwork back.   We will do these at next visit: Health Maintenance Due  Topic Date Due  . Pneumococcal Polysaccharide Vaccine (##1) 06/14/1957  . Foot Exam  06/14/1965

## 2014-05-27 NOTE — Assessment & Plan Note (Signed)
Continue medical management with aspirin, statin, BP control

## 2014-05-27 NOTE — Assessment & Plan Note (Signed)
Improved control to 7.6 from 8.1 switching from metformin and sitagliptin to metformin and Invokana. Advised need to add exercise and hopefully can reach A1c goal of 7 with current regimen and continued weight loss

## 2014-06-18 ENCOUNTER — Telehealth: Payer: Self-pay | Admitting: Family Medicine

## 2014-06-18 MED ORDER — L-METHYLFOLATE-B6-B12 3-35-2 MG PO TABS: 1.0000 | ORAL_TABLET | Freq: Every day | ORAL | Status: DC

## 2014-06-18 NOTE — Telephone Encounter (Signed)
[id number] - [place], [place] - [address] is requesting re-fill on l-methylfolate-B6-B12 (METANX) 3-35-2 MG TABS

## 2014-06-18 NOTE — Telephone Encounter (Signed)
Medication refilled

## 2014-07-02 ENCOUNTER — Telehealth: Payer: Self-pay | Admitting: Family Medicine

## 2014-07-02 MED ORDER — ROSUVASTATIN CALCIUM 20 MG PO TABS: 20.0000 mg | ORAL_TABLET | Freq: Every day | ORAL | Status: DC

## 2014-07-02 NOTE — Telephone Encounter (Signed)
[id number] [place], [place] [phone] is requesting re-fill on rosuvastatin (CRESTOR) 20 MG tablet

## 2014-07-02 NOTE — Telephone Encounter (Signed)
Mediation refilled.

## 2014-07-09 ENCOUNTER — Telehealth: Payer: Self-pay

## 2014-07-09 ENCOUNTER — Encounter: Payer: Self-pay | Admitting: Family Medicine

## 2014-07-09 MED ORDER — ALPRAZOLAM 0.5 MG PO TABS: 0.5000 mg | ORAL_TABLET | Freq: Four times a day (QID) | ORAL | Status: DC | PRN

## 2014-07-09 NOTE — Telephone Encounter (Signed)
Larry Garner you may fill x 2 months and ask patient to see me 3 months out from his October visit. Please let him know that I do not use xanax on a regular basis for daily use and we may need to consider other therapy.

## 2014-07-09 NOTE — Telephone Encounter (Signed)
error 

## 2014-07-09 NOTE — Telephone Encounter (Signed)
I am refilling pt Alprazolam,per Dr. Yong Channel please call pt and have him come in Jan to discuss this medication with Dr. Yong Channel please.

## 2014-07-09 NOTE — Telephone Encounter (Signed)
Pt has been scheduled.  °

## 2014-07-18 ENCOUNTER — Other Ambulatory Visit: Payer: Self-pay | Admitting: Family Medicine

## 2014-07-18 NOTE — Telephone Encounter (Signed)
Received a fax from [place] [place] [id number]

## 2014-07-19 MED ORDER — ZALEPLON 10 MG PO CAPS: 10.0000 mg | ORAL_CAPSULE | Freq: Every evening | ORAL | Status: DC | PRN

## 2014-07-19 NOTE — Telephone Encounter (Signed)
Ok to fill 

## 2014-07-19 NOTE — Telephone Encounter (Signed)
Is this ok to refill?  

## 2014-07-19 NOTE — Telephone Encounter (Signed)
Medication refilled

## 2014-08-06 ENCOUNTER — Other Ambulatory Visit: Payer: Self-pay | Admitting: *Deleted

## 2014-08-06 MED ORDER — OMEGA-3-ACID ETHYL ESTERS 1 G PO CAPS: 1.0000 g | ORAL_CAPSULE | Freq: Three times a day (TID) | ORAL | Status: DC

## 2014-08-14 ENCOUNTER — Encounter: Payer: Self-pay | Admitting: Family Medicine

## 2014-08-14 ENCOUNTER — Ambulatory Visit (INDEPENDENT_AMBULATORY_CARE_PROVIDER_SITE_OTHER): Payer: BLUE CROSS/BLUE SHIELD | Admitting: Family Medicine

## 2014-08-14 VITALS — BP 122/70 | Temp 99.1°F | Wt 216.0 lb

## 2014-08-14 DIAGNOSIS — F411 Generalized anxiety disorder: Secondary | ICD-10-CM

## 2014-08-14 DIAGNOSIS — Z23 Encounter for immunization: Secondary | ICD-10-CM

## 2014-08-14 NOTE — Progress Notes (Signed)
[name], MD Phone: [phone]  Subjective:   Larry Garner is a 59 y.o. 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 Stressful job. He states his workday goes much more smoothly with decreased anxiety on days he takes xanax. Currently 5 days a week he takes it and then usually 2 days off per week. Stress can be overwhelming at work and with caring for family if he does not take it.  ROS- No SI/HI  Past Medical History- Patient Active Problem List   Diagnosis Date Noted  . Poorly controlled diabetes mellitus 03/31/2010    Priority: High  . CAD (coronary artery disease) s/p CABG 04/06/2007    Priority: High  . Insomnia 05/27/2014    Priority: Medium  . Asthma, mild intermittent 06/19/2008    Priority: Medium  . ERECTILE DYSFUNCTION, SECONDARY TO MEDICATION 05/17/2007    Priority: Medium  . Hyperlipidemia 04/06/2007    Priority: Medium  . Anxiety state 04/06/2007    Priority: Medium  . Essential hypertension 04/06/2007    Priority: Medium  . Plantar wart of left foot 11/27/2010    Priority: Low  . PSA, INCREASED 09/15/2009    Priority: Low  . Diverticulitis of colon 05/08/2009    Priority: Low  . ANKLE PAIN, CHRONIC 06/05/2007    Priority: Low  . Allergic rhinitis 05/17/2007    Priority: Low   Medications- reviewed and updated Current Outpatient Prescriptions  Medication Sig Dispense Refill  . amLODipine (NORVASC) 5 MG tablet Take 5 mg by mouth daily.    Marland Kitchen aspirin 325 MG tablet Take 325 mg by mouth daily.      Marland Kitchen azelastine (ASTELIN) 137 MCG/SPRAY nasal spray Place 2 sprays into both nostrils 2 (two) times daily. Use in each nostril as directed 30 mL 0  . bisoprolol-hydrochlorothiazide (ZIAC) 10-6.25 MG per tablet Take 1 tablet by mouth daily.    . budesonide-formoterol (SYMBICORT) 160-4.5 MCG/ACT inhaler Inhale 2 puffs into the lungs 2 (two) times daily.    .  Canagliflozin-Metformin HCl (INVOKAMET) [phone] MG TABS Take 1 tablet by mouth 2 (two) times daily. 60 tablet 11  . diclofenac (CATAFLAM) 50 MG tablet Take 50 mg by mouth 2 (two) times daily as needed. For back pain     . fluticasone (FLONASE) 50 MCG/ACT nasal spray Place 2 sprays into both nostrils daily. 16 g 0  . folic acid (FOLVITE) 947 MCG tablet Take 1,600 mcg by mouth daily.      . Inositol Niacinate (NIACIN FLUSH FREE) 500 MG CAPS Take 1 capsule by mouth daily.      Marland Kitchen l-methylfolate-B6-B12 (METANX) 3-35-2 MG TABS Take 1 tablet by mouth daily. 60 tablet 0  . losartan (COZAAR) 100 MG tablet Take 100 mg by mouth daily.    . Multiple Vitamin (MULTIVITAMIN) tablet Take 1 tablet by mouth daily.      . nitroGLYCERIN (NITROSTAT) 0.4 MG SL tablet Place 0.4 mg under the tongue every 5 (five) minutes as needed for chest pain.    Marland Kitchen omega-3 acid ethyl esters (LOVAZA) 1 G capsule Take 1 capsule (1 g total) by mouth 3 (three) times daily. 90 capsule 5  . Pyridoxine HCl (VITAMIN B-6) 500 MG tablet Take 500 mg by mouth daily.      . rosuvastatin (CRESTOR) 20 MG tablet Take 1 tablet (20 mg total) by mouth daily. 30 tablet 1  . ALPRAZolam (XANAX) 0.5 MG tablet Take 1  tablet (0.5 mg total) by mouth every 6 (six) hours as needed. (Patient not taking: Reported on 08/14/2014) 30 tablet 2  . zaleplon (SONATA) 10 MG capsule Take 1 capsule (10 mg total) by mouth at bedtime as needed for sleep. (Patient not taking: Reported on 08/14/2014) 30 capsule 0     Objective: BP 122/70 mmHg  Temp(Src) 99.1 F (37.3 C)  Wt 216 lb (97.977 kg) Gen: NAD, resting comfortably   Assessment/Plan:  Anxiety state Many stressors. Xanax 5 days a week typically in AM at 7 pm. Does not use with alcohol (rare drinker), does not drink or use xanax within 8 hours of sonata. No driving accidents or falls. Discussed continuing current therapy as long as frequency stays < once daily but discussing SSRI or Buspar if increases.    >50% of  15 minute office visit was spent on counseling (benefits/risks of xanax, making sure to not use with sonata, making sure to not use with alcohol, need for SSRI if use = to daily) and coordination of care. We also had a discussion about not needing b complex in addition to multivitamin.   Follow up in March for physical and chronic disease management.   Orders Placed This Encounter  Procedures  . Pneumococcal conjugate vaccine 13-valent

## 2014-08-14 NOTE — Assessment & Plan Note (Signed)
Many stressors. Xanax 5 days a week typically in AM at 7 pm. Does not use with alcohol (rare drinker), does not drink or use xanax within 8 hours of sonata. No driving accidents or falls. Discussed continuing current therapy as long as frequency stays < once daily but discussing SSRI or Buspar if increases.

## 2014-08-14 NOTE — Patient Instructions (Addendum)
We agreed to continue xanax/alprazolam. We discussed benefits/risks. If you need one more refill before we see each other in March, we can provide that. I would want to start an SSRI/antidepressant if you ever have increasing need such as every single day use or multiple times a day.   If you ever have a problem with refills on this in the future, please reference this appointment.   Stop metanx, continue multivitamin  Pneumonia in a year and then 6 years.

## 2014-09-02 ENCOUNTER — Other Ambulatory Visit: Payer: Self-pay | Admitting: Family Medicine

## 2014-09-02 NOTE — Telephone Encounter (Signed)
Pls advise. Rx last filled 12.11.2015 #30. Pt last seen 1.6.2016

## 2014-09-02 NOTE — Telephone Encounter (Signed)
Yes 6 months worth

## 2014-09-06 ENCOUNTER — Encounter: Payer: Self-pay | Admitting: Family Medicine

## 2014-09-06 ENCOUNTER — Telehealth: Payer: Self-pay | Admitting: Family Medicine

## 2014-09-06 MED ORDER — ROSUVASTATIN CALCIUM 20 MG PO TABS: 20.0000 mg | ORAL_TABLET | Freq: Every day | ORAL | Status: DC

## 2014-09-06 NOTE — Telephone Encounter (Signed)
Pt requesting refill of rosuvastatin (CRESTOR) 20 MG tablet.

## 2014-09-06 NOTE — Telephone Encounter (Signed)
Derinda Late, please review the below messages. Not sure what else we can do to help.     Sorry for the trouble you have been having. The only requests I see from the 26th was for zaleplon which was filled that day. We received a request today 1/29 for crestor and it was filled. I can only help with the things that come across my desk/computer desk. If you have trouble like that in the future, you are welcome to send a request to mychart though it may take 2-3 days for it to get to my computer desk. There was no fight from our end as far as filling your medication and I had no issue filling them.   I will forward your concern to our office manager Derinda Late to see if there is anything else we can do to help you with this.   Thanks, [name]  ===View-only below this line===   ----- Message -----    From: Larry Garner    Sent: 09/06/2014  9:11 AM EST      To: [name], MD Subject: Non-Urgent Medical Question  Larry Garner,  I am becoming increasingly irritated with your practice.  My pharmacy has been contacting you since 01/27 to refill my prescription for Crestor.  I called them for the refill the evening on 01/26.  My request to them was to refill both Crestor and Zaleplon, which I know is a controlled substance.  The call from the pharmacy to your office indicated they asked for refills for both drugs; and we received the Zaleplon, not the Crestor.  [facility] indicated they have sent requests to your office since, both electronically and by fax.  When I stopped by the pharmacy on the way to work, there was no Crestor.  As I told you I was a patient of [name] for more than 20 years.  Our medication regime was very effective, and with the transition, I feel that I am not have to justify and fight for everything that has been in place for years.  I have become increasing weary dealing with these issues,  The client experience is simply not what is was and is less than desirable.    Larry Garner

## 2014-09-06 NOTE — Telephone Encounter (Signed)
Rx sent to pharmacy.  Called [facility] and spoke with Ronalee Belts and rx will be filled as soon as possible.

## 2014-09-25 ENCOUNTER — Telehealth: Payer: Self-pay

## 2014-09-25 MED ORDER — LOSARTAN POTASSIUM 100 MG PO TABS: 100.0000 mg | ORAL_TABLET | Freq: Every day | ORAL | Status: DC

## 2014-09-25 NOTE — Telephone Encounter (Signed)
Medication refilled

## 2014-09-25 NOTE — Telephone Encounter (Signed)
[facility] refill request for LOSARTAN 100MG  TABLET #90 with 3 refills

## 2014-10-14 ENCOUNTER — Other Ambulatory Visit: Payer: Self-pay

## 2014-10-14 ENCOUNTER — Encounter: Payer: Self-pay | Admitting: Family Medicine

## 2014-10-14 MED ORDER — ALPRAZOLAM 0.5 MG PO TABS
0.5000 mg | ORAL_TABLET | Freq: Four times a day (QID) | ORAL | Status: DC | PRN
Start: 2014-10-14 — End: 2015-01-03

## 2014-10-28 ENCOUNTER — Other Ambulatory Visit (INDEPENDENT_AMBULATORY_CARE_PROVIDER_SITE_OTHER): Payer: BLUE CROSS/BLUE SHIELD

## 2014-10-28 DIAGNOSIS — Z0000 Encounter for general adult medical examination without abnormal findings: Secondary | ICD-10-CM

## 2014-10-28 LAB — BASIC METABOLIC PANEL
BUN: 16 mg/dL (ref 6–23)
CALCIUM: 9.8 mg/dL (ref 8.4–10.5)
CO2: 28 mEq/L (ref 19–32)
Chloride: 105 mEq/L (ref 96–112)
Creatinine, Ser: 0.97 mg/dL (ref 0.40–1.50)
GFR: 84.09 mL/min (ref >60.00)
Glucose, Bld: 143 mg/dL — ABNORMAL HIGH (ref 70–99)
POTASSIUM: 4.2 meq/L (ref 3.5–5.1)
SODIUM: 140 meq/L (ref 135–145)

## 2014-10-28 LAB — CBC WITH DIFFERENTIAL/PLATELET
Basophils Absolute: 0 10*3/uL (ref 0.0–0.1)
Basophils Relative: 0.4 % (ref 0.0–3.0)
EOS PCT: 3.8 % (ref 0.0–5.0)
Eosinophils Absolute: 0.4 10*3/uL (ref 0.0–0.7)
HCT: 45.9 % (ref 39.0–52.0)
Hemoglobin: 15.8 g/dL (ref 13.0–17.0)
LYMPHS ABS: 1.9 10*3/uL (ref 0.7–4.0)
Lymphocytes Relative: 20.4 % (ref 12.0–46.0)
MCHC: 34.5 g/dL (ref 30.0–36.0)
MCV: 87.3 fl (ref 78.0–100.0)
MONO ABS: 0.6 10*3/uL (ref 0.1–1.0)
Monocytes Relative: 6.4 % (ref 3.0–12.0)
Neutro Abs: 6.5 10*3/uL (ref 1.4–7.7)
Neutrophils Relative %: 69 % (ref 43.0–77.0)
PLATELETS: 280 10*3/uL (ref 150.0–400.0)
RBC: 5.26 Mil/uL (ref 4.22–5.81)
RDW: 13.7 % (ref 11.5–15.5)
WBC: 9.4 10*3/uL (ref 4.0–10.5)

## 2014-10-28 LAB — HEPATIC FUNCTION PANEL
ALBUMIN: 4.2 g/dL (ref 3.5–5.2)
ALK PHOS: 72 U/L (ref 39–117)
ALT: 18 U/L (ref 0–53)
AST: 14 U/L (ref 0–37)
BILIRUBIN TOTAL: 0.7 mg/dL (ref 0.2–1.2)
Bilirubin, Direct: 0.1 mg/dL (ref 0.0–0.3)
TOTAL PROTEIN: 6.9 g/dL (ref 6.0–8.3)

## 2014-10-28 LAB — LIPID PANEL
Cholesterol: 105 mg/dL (ref 0–200)
HDL: 31.8 mg/dL — AB (ref >39.00)
LDL Cholesterol: 34 mg/dL (ref 0–99)
NonHDL: 73.2
Total CHOL/HDL Ratio: 3
Triglycerides: 194 mg/dL — ABNORMAL HIGH (ref 0.0–149.0)
VLDL: 38.8 mg/dL (ref 0.0–40.0)

## 2014-10-28 LAB — POCT URINALYSIS DIPSTICK
Bilirubin, UA: NEGATIVE
Blood, UA: NEGATIVE
KETONES UA: NEGATIVE
LEUKOCYTES UA: NEGATIVE
Nitrite, UA: NEGATIVE
PH UA: 5
Protein, UA: NEGATIVE
Spec Grav, UA: <=1.005
UROBILINOGEN UA: 0.2

## 2014-10-28 LAB — PSA: PSA: 0.84 ng/mL (ref 0.10–4.00)

## 2014-10-28 LAB — MICROALBUMIN / CREATININE URINE RATIO
Creatinine,U: 96.4 mg/dL
Microalb Creat Ratio: 2.6 mg/g (ref 0.0–30.0)
Microalb, Ur: 2.5 mg/dL — ABNORMAL HIGH (ref 0.0–1.9)

## 2014-10-28 LAB — HEMOGLOBIN A1C: Hgb A1c MFr Bld: 8.2 % — ABNORMAL HIGH (ref 4.6–6.5)

## 2014-10-28 LAB — TSH: TSH: 0.36 u[IU]/mL (ref 0.35–4.50)

## 2014-11-04 ENCOUNTER — Encounter: Payer: Self-pay | Admitting: Family Medicine

## 2014-11-04 ENCOUNTER — Ambulatory Visit (INDEPENDENT_AMBULATORY_CARE_PROVIDER_SITE_OTHER): Payer: BLUE CROSS/BLUE SHIELD | Admitting: Family Medicine

## 2014-11-04 VITALS — BP 100/64 | HR 67 | Temp 98.5°F | Wt 213.0 lb

## 2014-11-04 DIAGNOSIS — Z0000 Encounter for general adult medical examination without abnormal findings: Secondary | ICD-10-CM | POA: Diagnosis not present

## 2014-11-04 DIAGNOSIS — Z87891 Personal history of nicotine dependence: Secondary | ICD-10-CM | POA: Insufficient documentation

## 2014-11-04 DIAGNOSIS — E1165 Type 2 diabetes mellitus with hyperglycemia: Secondary | ICD-10-CM | POA: Diagnosis not present

## 2014-11-04 DIAGNOSIS — R229 Localized swelling, mass and lump, unspecified: Secondary | ICD-10-CM | POA: Diagnosis not present

## 2014-11-04 NOTE — Patient Instructions (Addendum)
Contact insurance regarding Zostavax  A1c up to 8.2. You know what you need to do to turn this around. Let's check in 3 months from now. Please get an a1c about a week before your next visit.   We ordered an ultrasound to look at the spot on your neck and see if it is obviously attached to underlying tissues. I asked radiology to comment on if they thought you would need CT or MRI.   3 month follow up.

## 2014-11-04 NOTE — Progress Notes (Signed)
Larry Reddish, MD Phone: [phone]  Subjective:  Patient presents today for their annual physical. Chief complaint-noted.   We discussed his labs and concerning trends for sedentary activity including a1c increase and high triglycerides. Patient stopped exercising over winter but is inspired again. Regarding CAD, denies chest pain or shortness of breath and has not had to use nitroglycerin.   He has a spot on his back right neck that seems to be growing. Has been there at least 6 months but seems to be getting bigger. Sometimes if he turns his neck it will hurt slightly but not everytime. He has some cysts on left neck that seem more superficial and more mobile. This spot seems slightly more firm and does not move as freely with the skin.   ROS- full  review of systems was completed and negative except for: under the skin mass noted above.   The following were reviewed and entered/updated in epic: Past Medical History  Diagnosis Date  . Anxiety   . HLD (hyperlipidemia)   . HTN (hypertension)   . History of heart attack   . Allergic rhinitis   . Diabetes   . Asthma    Patient Active Problem List   Diagnosis Date Noted  . Poorly controlled diabetes mellitus 03/31/2010    Priority: High  . CAD (coronary artery disease) s/p CABG 04/06/2007    Priority: High  . Insomnia 05/27/2014    Priority: Medium  . Asthma, mild intermittent 06/19/2008    Priority: Medium  . ERECTILE DYSFUNCTION, SECONDARY TO MEDICATION 05/17/2007    Priority: Medium  . Hyperlipidemia 04/06/2007    Priority: Medium  . Anxiety state 04/06/2007    Priority: Medium  . Essential hypertension 04/06/2007    Priority: Medium  . Former smoker 11/04/2014    Priority: Low  . Plantar wart of left foot 11/27/2010    Priority: Low  . PSA, INCREASED 09/15/2009    Priority: Low  . Diverticulitis of colon 05/08/2009    Priority: Low  . ANKLE PAIN, CHRONIC 06/05/2007    Priority: Low  . Allergic rhinitis  05/17/2007    Priority: Low   Past Surgical History  Procedure Laterality Date  . 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 Coronary angioplasty      Family History  Problem Relation Age of Onset  . Hypertension Mother   . Alzheimer's disease Mother   . Hyperlipidemia Father   . Hypertension Father   . Colon polyps Father   . Colon cancer Paternal Aunt   . Pancreatic cancer Neg Hx   . Stomach cancer Neg Hx     Medications- reviewed and updated Current Outpatient Prescriptions  Medication Sig Dispense Refill  . amLODipine (NORVASC) 5 MG tablet Take 5 mg by mouth daily.    Marland Kitchen aspirin 325 MG tablet Take 325 mg by mouth daily.      . bisoprolol-hydrochlorothiazide (ZIAC) 10-6.25 MG per tablet Take 1 tablet by mouth daily.    . budesonide-formoterol (SYMBICORT) 160-4.5 MCG/ACT inhaler Inhale 2 puffs into the lungs 2 (two) times daily.    . Canagliflozin-Metformin HCl (INVOKAMET) [id number] MG TABS Take 1 tablet by mouth 2 (two) times daily. 60 tablet 11  . folic acid (FOLVITE) 341 MCG tablet Take 1,600 mcg by mouth daily.      Marland Kitchen losartan (COZAAR) 100 MG tablet Take 1 tablet (100 mg total) by mouth daily. 90 tablet 1  . Multiple Vitamin (MULTIVITAMIN) tablet Take 1 tablet by mouth daily.      Marland Kitchen  omega-3 acid ethyl esters (LOVAZA) 1 G capsule Take 1 capsule (1 g total) by mouth 3 (three) times daily. 90 capsule 5  . Pyridoxine HCl (VITAMIN B-6) 500 MG tablet Take 500 mg by mouth daily.      . rosuvastatin (CRESTOR) 20 MG tablet Take 1 tablet (20 mg total) by mouth daily. 30 tablet 5  . zaleplon (SONATA) 10 MG capsule TAKE ONE CAPSULE BY MOUTH AT BEDTIME AS NEEDED FOR SLEEP 30 capsule 5  . ALPRAZolam (XANAX) 0.5 MG tablet Take 1 tablet (0.5 mg total) by mouth every 6 (six) hours as needed. (Patient not taking: Reported on 11/04/2014) 30 tablet 1  . azelastine (ASTELIN) 137 MCG/SPRAY nasal spray Place 2 sprays into both nostrils 2 (two) times daily. Use in each nostril as directed (Patient not  taking: Reported on 11/04/2014) 30 mL 0  . fluticasone (FLONASE) 50 MCG/ACT nasal spray Place 2 sprays into both nostrils daily. (Patient not taking: Reported on 11/04/2014) 16 g 0  . Inositol Niacinate (NIACIN FLUSH FREE) 500 MG CAPS Take 1 capsule by mouth daily.      . nitroGLYCERIN (NITROSTAT) 0.4 MG SL tablet Place 0.4 mg under the tongue every 5 (five) minutes as needed for chest pain.     Allergies-reviewed and updated Allergies  Allergen Reactions  . Morphine Sulfate     REACTION: nausea    History   Social History  . Marital Status: Married    Spouse Name: N/A  . Number of Children: N/A  . Years of Education: N/A   Occupational History  . Banker    Social History Main Topics  . Smoking status: Former Smoker -- 0.50 packs/day for 4 years    Types: Cigarettes    Quit date: 09/29/1977  . Smokeless tobacco: Never Used     Comment: qiot om 1980  . Alcohol Use: 0.5 oz/week    1 drink(s) per week  . Drug Use: No  . Sexual Activity: Yes   Other Topics Concern  . Not on file   Social History Narrative   Married  In 1981 with 2 kids (son and daughter). No grandkids.       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 Oropharynx normal 2x2 cm subcutaneous nodule on back of right neck-not clearly freely mobile, may be attached to underlying tissues Neck: no thyromegaly, no lymphadenopathy CV: RRR no murmurs rubs or gallops Lungs: CTAB no crackles, wheeze, rhonchi Abdomen: soft/nontender/nondistended/normal bowel sounds. No rebound or guarding.  Ext: no edema, 2+ PT pulses Skin: warm, dry, no rash Neuro: grossly normal, moves all extremities, PERRLA  Diabetic Foot Exam - Simple   Simple Foot Form  Diabetic Foot exam was performed with  the following findings:  Yes 11/04/2014  9:06 AM  Visual Inspection  No deformities, no ulcerations, no other skin breakdown bilaterally:  Yes  Sensation Testing  Intact to touch and monofilament testing bilaterally:  Yes  Pulse Check  Posterior Tibialis and Dorsalis pulse intact bilaterally:  Yes  Comments      Assessment/Plan:  59 y.o. male presenting for annual physical.  Health Maintenance counseling: 1. Anticipatory guidance: Patient counseled regarding regular dental exams 2x a year Dr. Ronnald Ramp, wearing seatbelts, wearing  sunscreen, regular eye exams in July. No dermatologist.  2. Risk factor reduction:  Advised patient of need for regular exercise and diet rich and fruits and vegetables to reduce risk of heart attack and stroke.  3. Immunizations/screenings/ancillary studies- has had HIV screening due to giving blood before 2000.   Subcutaneous nodule- possible lipoma but slightly more firm and may be attached to underlying Muscle. For this reason, obtain ultrasound to see if truly attached vs. Having typical lipoma features. May consider CT or MRI and asked radiology to comment. Could also consider biopsy-surgery vs. Derm vs. IR?   3 month DM f/u with a1c 1 week before.    Orders Placed This Encounter  Procedures  . US Soft Tissue Head/Neck    Standing Status: Future     Number of Occurrences:      Standing Expiration Date: 01/04/2016    Order Specific Question:  Reason for Exam (SYMPTOM  OR DIAGNOSIS REQUIRED)    Answer:  Back right neck-subcutaneous nodule. ? Lipoma or cyst. But feels like may be attached to underlying muscle. Please advise if you think would warrant CT or MRI.    Order Specific Question:  Preferred imaging location?    Answer:  [facility]  . Hemoglobin A1c    Standing Status: Future     Number of Occurrences:      Standing Expiration Date: 11/04/2015

## 2014-11-05 ENCOUNTER — Ambulatory Visit
Admission: RE | Admit: 2014-11-05 | Discharge: 2014-11-05 | Disposition: A | Discharge status: Home / Self Care | Payer: BLUE CROSS/BLUE SHIELD | Source: Ambulatory Visit | Attending: Family Medicine | Admitting: Family Medicine

## 2014-11-05 ENCOUNTER — Telehealth: Payer: Self-pay | Admitting: Family Medicine

## 2014-11-05 DIAGNOSIS — R229 Localized swelling, mass and lump, unspecified: Secondary | ICD-10-CM

## 2014-11-05 NOTE — Telephone Encounter (Signed)
Ordered neck ct

## 2014-11-07 ENCOUNTER — Ambulatory Visit (INDEPENDENT_AMBULATORY_CARE_PROVIDER_SITE_OTHER)
Admission: RE | Admit: 2014-11-07 | Discharge: 2014-11-07 | Disposition: A | Discharge status: Home / Self Care | Payer: BLUE CROSS/BLUE SHIELD | Source: Ambulatory Visit | Attending: Family Medicine | Admitting: Family Medicine

## 2014-11-07 DIAGNOSIS — R229 Localized swelling, mass and lump, unspecified: Secondary | ICD-10-CM

## 2014-11-07 MED ORDER — IOHEXOL 300 MG/ML  SOLN
80.0000 mL | Freq: Once | INTRAMUSCULAR | Status: AC | PRN
  Administered 2014-11-07: 80 mL via INTRAVENOUS

## 2014-11-08 ENCOUNTER — Telehealth: Payer: Self-pay

## 2014-11-08 MED ORDER — BISOPROLOL-HYDROCHLOROTHIAZIDE 10-6.25 MG PO TABS: 1.0000 | ORAL_TABLET | Freq: Every day | ORAL | Status: DC

## 2014-11-08 NOTE — Telephone Encounter (Signed)
[facility] refill request for BISOPROLOL-HCTZ 10-6.25 MG TAB

## 2014-11-08 NOTE — Telephone Encounter (Signed)
Medication refilled

## 2014-11-09 ENCOUNTER — Encounter: Payer: Self-pay | Admitting: Family Medicine

## 2014-11-09 ENCOUNTER — Ambulatory Visit (INDEPENDENT_AMBULATORY_CARE_PROVIDER_SITE_OTHER): Payer: BLUE CROSS/BLUE SHIELD | Admitting: Family Medicine

## 2014-11-09 VITALS — BP 110/70 | HR 84 | Temp 98.5°F | Wt 208.5 lb

## 2014-11-09 DIAGNOSIS — J019 Acute sinusitis, unspecified: Secondary | ICD-10-CM | POA: Insufficient documentation

## 2014-11-09 DIAGNOSIS — J0110 Acute frontal sinusitis, unspecified: Secondary | ICD-10-CM

## 2014-11-09 MED ORDER — AMOXICILLIN-POT CLAVULANATE 875-125 MG PO TABS
1.0000 | ORAL_TABLET | Freq: Two times a day (BID) | ORAL | Status: DC
Start: 2014-11-09 — End: 2014-11-26

## 2014-11-09 MED ORDER — AZELASTINE-FLUTICASONE 137-50 MCG/ACT NA SUSP: NASAL | Status: DC

## 2014-11-09 NOTE — Progress Notes (Signed)
Pre visit review using our clinic review tool, if applicable. No additional management support is needed unless otherwise documented below in the visit note. 

## 2014-11-09 NOTE — Progress Notes (Signed)
Subjective:    Patient ID: Larry Garner, male    DOB: 09/15/1955, 59 y.o.   MRN: [id number]  HPI  Patient here for c/o cough and sinus congestion.  He is not taking anything otc except tylenol.   + fever 102.1   Past Medical History  Diagnosis Date  . Anxiety   . HLD (hyperlipidemia)   . HTN (hypertension)   . History of heart attack   . Allergic rhinitis   . Diabetes   . Asthma     Review of Systems  Constitutional: Positive for chills. Negative for fever.  HENT: Positive for congestion, postnasal drip, rhinorrhea and sinus pressure.   Respiratory: Positive for cough, chest tightness, shortness of breath and wheezing.   Cardiovascular: Negative for chest pain, palpitations and leg swelling.  Allergic/Immunologic: Negative for environmental allergies.  Psychiatric/Behavioral: Negative for dysphoric mood and decreased concentration. The patient is not nervous/anxious.     Current Outpatient Prescriptions on File Prior to Visit  Medication Sig Dispense Refill  . ALPRAZolam (XANAX) 0.5 MG tablet Take 1 tablet (0.5 mg total) by mouth every 6 (six) hours as needed. 30 tablet 1  . amLODipine (NORVASC) 5 MG tablet Take 5 mg by mouth daily.    Marland Kitchen aspirin 325 MG tablet Take 325 mg by mouth daily.      Marland Kitchen azelastine (ASTELIN) 137 MCG/SPRAY nasal spray Place 2 sprays into both nostrils 2 (two) times daily. Use in each nostril as directed 30 mL 0  . bisoprolol-hydrochlorothiazide (ZIAC) 10-6.25 MG per tablet Take 1 tablet by mouth daily. 30 tablet 6  . budesonide-formoterol (SYMBICORT) 160-4.5 MCG/ACT inhaler Inhale 2 puffs into the lungs 2 (two) times daily.    . Canagliflozin-Metformin HCl (INVOKAMET) [phone] MG TABS Take 1 tablet by mouth 2 (two) times daily. 60 tablet 11  . fluticasone (FLONASE) 50 MCG/ACT nasal spray Place 2 sprays into both nostrils daily. 16 g 0  . folic acid (FOLVITE) 366 MCG tablet Take 1,600 mcg by mouth daily.      . Inositol Niacinate (NIACIN FLUSH FREE) 500  MG CAPS Take 1 capsule by mouth daily.      Marland Kitchen losartan (COZAAR) 100 MG tablet Take 1 tablet (100 mg total) by mouth daily. 90 tablet 1  . Multiple Vitamin (MULTIVITAMIN) tablet Take 1 tablet by mouth daily.      . nitroGLYCERIN (NITROSTAT) 0.4 MG SL tablet Place 0.4 mg under the tongue every 5 (five) minutes as needed for chest pain.    Marland Kitchen omega-3 acid ethyl esters (LOVAZA) 1 G capsule Take 1 capsule (1 g total) by mouth 3 (three) times daily. 90 capsule 5  . Pyridoxine HCl (VITAMIN B-6) 500 MG tablet Take 500 mg by mouth daily.      . rosuvastatin (CRESTOR) 20 MG tablet Take 1 tablet (20 mg total) by mouth daily. 30 tablet 5  . zaleplon (SONATA) 10 MG capsule TAKE ONE CAPSULE BY MOUTH AT BEDTIME AS NEEDED FOR SLEEP 30 capsule 5   No current facility-administered medications on file prior to visit.       Objective:    Physical Exam  Constitutional: He appears well-developed and well-nourished. No distress.  HENT:  Head: Normocephalic and atraumatic.  Right Ear: Tympanic membrane normal.  Left Ear: Tympanic membrane normal.  Nose: Mucosal edema and rhinorrhea present. Right sinus exhibits maxillary sinus tenderness and frontal sinus tenderness. Left sinus exhibits maxillary sinus tenderness and frontal sinus tenderness.  Mouth/Throat: Mucous membranes are normal. Oropharyngeal exudate  and posterior oropharyngeal erythema present. No posterior oropharyngeal edema.  + PND  Eyes: Conjunctivae and EOM are normal. Pupils are equal, round, and reactive to light.  Neck: Normal range of motion. Neck supple.  Cardiovascular: Normal rate, regular rhythm and normal heart sounds.   Pulmonary/Chest: Effort normal and breath sounds normal. No respiratory distress. He has no wheezes.  + hacking cough  Lymphadenopathy:    He has cervical adenopathy.  Skin: Skin is warm. He is not diaphoretic.    BP 110/70 mmHg  Pulse 84  Temp(Src) 98.5 F (36.9 C) (Oral)  Wt 208 lb 8 oz (94.575 kg)  SpO2 95% Wt  Readings from Last 3 Encounters:  11/09/14 208 lb 8 oz (94.575 kg)  11/04/14 213 lb (96.616 kg)  08/14/14 216 lb (97.977 kg)     Lab Results  Component Value Date   WBC 9.4 10/28/2014   HGB 15.8 10/28/2014   HCT 45.9 10/28/2014   PLT 280.0 10/28/2014   GLUCOSE 143* 10/28/2014   CHOL 105 10/28/2014   TRIG 194.0* 10/28/2014   HDL 31.80* 10/28/2014   LDLCALC 34 10/28/2014   ALT 18 10/28/2014   AST 14 10/28/2014   NA 140 10/28/2014   K 4.2 10/28/2014   CL 105 10/28/2014   CREATININE 0.97 10/28/2014   BUN 16 10/28/2014   CO2 28 10/28/2014   TSH 0.36 10/28/2014   PSA 0.84 10/28/2014   HGBA1C 8.2* 10/28/2014   MICROALBUR 2.5* 10/28/2014       Assessment & Plan:   Problem List Items Addressed This Visit    Sinusitis, acute - Primary    abx and steroid nasal spray Call or rto prn      Relevant Medications   Azelastine-Fluticasone (DYMISTA) 137-50 MCG/ACT SUSP   AMOXICILLIN-POT CLAVULANATE 875-125 MG PO TABS      I am having Mr. Slavick start on Azelastine-Fluticasone and amoxicillin-clavulanate. I am also having him maintain his aspirin, folic acid, multivitamin, Inositol Niacinate, vitamin B-6, Canagliflozin-Metformin HCl, azelastine, fluticasone, amLODipine, nitroGLYCERIN, budesonide-formoterol, omega-3 acid ethyl esters, zaleplon, rosuvastatin, losartan, ALPRAZolam, and bisoprolol-hydrochlorothiazide.  Meds ordered this encounter  Medications  . Azelastine-Fluticasone (DYMISTA) 137-50 MCG/ACT SUSP    Sig: 1 spray each nostril bid    Dispense:  1 Bottle    Refill:  5  . amoxicillin-clavulanate (AUGMENTIN) 875-125 MG per tablet    Sig: Take 1 tablet by mouth 2 (two) times daily.    Dispense:  20 tablet    Refill:  0     Garnet Koyanagi, DO

## 2014-11-09 NOTE — Patient Instructions (Signed)

## 2014-11-09 NOTE — Assessment & Plan Note (Signed)
abx and steroid nasal spray Call or rto prn

## 2014-11-18 ENCOUNTER — Other Ambulatory Visit: Payer: Self-pay | Admitting: Family Medicine

## 2014-11-26 ENCOUNTER — Encounter: Payer: Self-pay | Admitting: Family Medicine

## 2014-11-26 ENCOUNTER — Ambulatory Visit (INDEPENDENT_AMBULATORY_CARE_PROVIDER_SITE_OTHER): Payer: BLUE CROSS/BLUE SHIELD | Admitting: Family Medicine

## 2014-11-26 VITALS — BP 131/75 | HR 92 | Temp 100.0°F | Ht 71.0 in | Wt 212.0 lb

## 2014-11-26 DIAGNOSIS — J0100 Acute maxillary sinusitis, unspecified: Secondary | ICD-10-CM

## 2014-11-26 MED ORDER — LEVOFLOXACIN 500 MG PO TABS: 500.0000 mg | ORAL_TABLET | Freq: Every day | ORAL | Status: AC

## 2014-11-26 NOTE — Progress Notes (Signed)
Pre visit review using our clinic review tool, if applicable. No additional management support is needed unless otherwise documented below in the visit note. 

## 2014-11-26 NOTE — Progress Notes (Signed)
   Subjective:    Patient ID: Larry Garner, male    DOB: 1955-11-15, 59 y.o.   MRN: [id number]  HPI Here for continued sx of sinus pressure, PND, and a dry cough. No fever. He took a course of Augmentin 2 weeks ago with no improvement.    Review of Systems  Constitutional: Negative.   HENT: Positive for congestion, postnasal drip and sinus pressure.   Eyes: Negative.   Respiratory: Positive for cough.        Objective:   Physical Exam  Constitutional: He appears well-developed and well-nourished.  HENT:  Right Ear: External ear normal.  Left Ear: External ear normal.  Nose: Nose normal.  Mouth/Throat: Oropharynx is clear and moist.  Eyes: Conjunctivae are normal.  Pulmonary/Chest: Effort normal and breath sounds normal.  Lymphadenopathy:    He has no cervical adenopathy.          Assessment & Plan:  Add Mucinex prn

## 2014-12-05 ENCOUNTER — Other Ambulatory Visit: Payer: Self-pay | Admitting: Family Medicine

## 2014-12-12 ENCOUNTER — Telehealth: Payer: Self-pay

## 2014-12-12 MED ORDER — BUDESONIDE-FORMOTEROL FUMARATE 160-4.5 MCG/ACT IN AERO: 2.0000 | INHALATION_SPRAY | Freq: Two times a day (BID) | RESPIRATORY_TRACT | Status: DC

## 2014-12-12 NOTE — Telephone Encounter (Signed)
[facility] refill request for budesonide-formoterol (SYMBICORT) 160-4.5 MCG/ACT inhaler

## 2014-12-12 NOTE — Telephone Encounter (Signed)
Medication refilled

## 2015-01-02 ENCOUNTER — Encounter: Payer: Self-pay | Admitting: Family Medicine

## 2015-01-03 MED ORDER — ALPRAZOLAM 0.5 MG PO TABS: 0.5000 mg | ORAL_TABLET | Freq: Four times a day (QID) | ORAL | Status: DC | PRN

## 2015-01-03 NOTE — Telephone Encounter (Signed)
May call in and inform patient. Thanks! [name]

## 2015-01-27 ENCOUNTER — Other Ambulatory Visit (INDEPENDENT_AMBULATORY_CARE_PROVIDER_SITE_OTHER): Payer: BLUE CROSS/BLUE SHIELD

## 2015-01-27 DIAGNOSIS — E1165 Type 2 diabetes mellitus with hyperglycemia: Secondary | ICD-10-CM

## 2015-01-27 LAB — HEMOGLOBIN A1C: HEMOGLOBIN A1C: 7.3 % — AB (ref 4.6–6.5)

## 2015-02-03 ENCOUNTER — Ambulatory Visit (INDEPENDENT_AMBULATORY_CARE_PROVIDER_SITE_OTHER): Payer: BLUE CROSS/BLUE SHIELD | Admitting: Family Medicine

## 2015-02-03 ENCOUNTER — Encounter: Payer: Self-pay | Admitting: Family Medicine

## 2015-02-03 VITALS — BP 120/72 | HR 56 | Temp 98.7°F | Wt 211.0 lb

## 2015-02-03 DIAGNOSIS — I2510 Atherosclerotic heart disease of native coronary artery without angina pectoris: Secondary | ICD-10-CM

## 2015-02-03 DIAGNOSIS — I10 Essential (primary) hypertension: Secondary | ICD-10-CM | POA: Diagnosis not present

## 2015-02-03 DIAGNOSIS — E1165 Type 2 diabetes mellitus with hyperglycemia: Secondary | ICD-10-CM | POA: Diagnosis not present

## 2015-02-03 NOTE — Patient Instructions (Signed)
See me in 4-5 months a1c a few days before visit  Keep up the great job with exercise and eating healthy. Weight goal under 200. Hopeful we can get a1c under 7 with this. Hopeful do not need to add more medicine to achieve your goals.   Blood pressure looks fantastic.

## 2015-02-03 NOTE — Assessment & Plan Note (Signed)
S: asymptomatic. Compliant with aspirin, BP control, statin. Doing well on his exercise A/P: continue ASA, statin, BP control.

## 2015-02-03 NOTE — Assessment & Plan Note (Signed)
S: controlled on Amlodipine 5mg , losartan 100mg , bisoprolol-hctz 10-6.25 A/P: invokana also likely helping. Continue current rx. Hopeful potentially could reduce doses in future with continued exercises/weight loss

## 2015-02-03 NOTE — Progress Notes (Signed)
Larry Reddish, MD  Subjective:  Larry Garner is a 59 y.o. year old very pleasant male patient who presents with:  See problem oriented charting ROS- no chest pain, shortness of breath, lightheadedness, blurry vision. No hypoglycemia.   Past Medical History- anxiety, asthma, ED, HLD, insomnia  Medications- reviewed and updated Current Outpatient Prescriptions  Medication Sig Dispense Refill  . ALPRAZolam (XANAX) 0.5 MG tablet Take 1 tablet (0.5 mg total) by mouth every 6 (six) hours as needed. 30 tablet 1  . amLODipine (NORVASC) 5 MG tablet TAKE 1 TABLET (5 MG TOTAL) BY MOUTH DAILY. 30 tablet 5  . aspirin 325 MG tablet Take 325 mg by mouth daily.      . Azelastine-Fluticasone (DYMISTA) 137-50 MCG/ACT SUSP 1 spray each nostril bid 1 Bottle 5  . bisoprolol-hydrochlorothiazide (ZIAC) 10-6.25 MG per tablet Take 1 tablet by mouth daily. 30 tablet 6  . budesonide-formoterol (SYMBICORT) 160-4.5 MCG/ACT inhaler Inhale 2 puffs into the lungs 2 (two) times daily. 1 Inhaler 3  . folic acid (FOLVITE) 009 MCG tablet Take 1,600 mcg by mouth daily.      . Inositol Niacinate (NIACIN FLUSH FREE) 500 MG CAPS Take 1 capsule by mouth daily.      . INVOKAMET [phone] MG TABS TAKE 1 TABLET BY MOUTH 2 (TWO) TIMES DAILY. 60 tablet 6  . losartan (COZAAR) 100 MG tablet Take 1 tablet (100 mg total) by mouth daily. 90 tablet 1  . Multiple Vitamin (MULTIVITAMIN) tablet Take 1 tablet by mouth daily.      Marland Kitchen omega-3 acid ethyl esters (LOVAZA) 1 G capsule Take 1 capsule (1 g total) by mouth 3 (three) times daily. 90 capsule 5  . Pyridoxine HCl (VITAMIN B-6) 500 MG tablet Take 500 mg by mouth daily.      . rosuvastatin (CRESTOR) 20 MG tablet Take 1 tablet (20 mg total) by mouth daily. 30 tablet 5  . zaleplon (SONATA) 10 MG capsule TAKE ONE CAPSULE BY MOUTH AT BEDTIME AS NEEDED FOR SLEEP 30 capsule 5  . azelastine (ASTELIN) 137 MCG/SPRAY nasal spray Place 2 sprays into both nostrils 2 (two) times daily. Use in each  nostril as directed (Patient not taking: Reported on 02/03/2015) 30 mL 0  . fluticasone (FLONASE) 50 MCG/ACT nasal spray Place 2 sprays into both nostrils daily. (Patient not taking: Reported on 11/26/2014) 16 g 0  . nitroGLYCERIN (NITROSTAT) 0.4 MG SL tablet Place 0.4 mg under the tongue every 5 (five) minutes as needed for chest pain.     Objective: BP 120/72 mmHg  Pulse 56  Temp(Src) 98.7 F (37.1 C)  Wt 211 lb (95.709 kg) Gen: NAD, resting comfortably TM normal bilaterally (some L ear pain but no abnormalities noted) CV: RRR no murmurs rubs or gallops Lungs: CTAB no crackles, wheeze, rhonchi Abdomen: soft/nontender/nondistended/normal bowel sounds. No rebound or guarding. overweight Ext: no edema Skin: warm, dry Neuro: grossly normal, moves all extremities   Assessment/Plan:  CAD (coronary artery disease) s/p CABG S: asymptomatic. Compliant with aspirin, BP control, statin. Doing well on his exercise A/P: continue ASA, statin, BP control.   Poorly controlled diabetes mellitus S: improved control on invokamet 150-1000mg  BID with a1c down to 7.3 when previously up to as high as 9 in last year. Yardwork, gym 2x in addition usually elliptical/walk 45 mins A/P: continue current rx. Weight goal 200 lbs, hopeful can get a1c to goal if able to get to that weight.    Essential hypertension S: controlled on Amlodipine 5mg , losartan  100mg , bisoprolol-hctz 10-6.25 A/P: invokana also likely helping. Continue current rx. Hopeful potentially could reduce doses in future with continued exercises/weight loss  4-5 month f/u a1c before visit Orders Placed This Encounter  Procedures  . Hemoglobin A1c    [place]    Standing Status: Future     Number of Occurrences:      Standing Expiration Date: 02/03/2016

## 2015-02-03 NOTE — Assessment & Plan Note (Signed)
S: improved control on invokamet 150-1000mg  BID with a1c down to 7.3 when previously up to as high as 9 in last year. Yardwork, gym 2x in addition usually elliptical/walk 45 mins A/P: continue current rx. Weight goal 200 lbs, hopeful can get a1c to goal if able to get to that weight.

## 2015-02-20 ENCOUNTER — Encounter: Payer: Self-pay | Admitting: Gastroenterology

## 2015-02-26 LAB — HM DIABETES EYE EXAM

## 2015-02-27 ENCOUNTER — Encounter: Payer: Self-pay | Admitting: Family Medicine

## 2015-03-06 ENCOUNTER — Other Ambulatory Visit: Payer: Self-pay | Admitting: Family Medicine

## 2015-03-18 ENCOUNTER — Other Ambulatory Visit: Payer: Self-pay | Admitting: Family Medicine

## 2015-03-18 NOTE — Telephone Encounter (Signed)
Yes thanks 

## 2015-03-18 NOTE — Telephone Encounter (Signed)
Refill ok? 

## 2015-04-02 ENCOUNTER — Other Ambulatory Visit: Payer: Self-pay | Admitting: Family Medicine

## 2015-04-08 ENCOUNTER — Encounter: Payer: Self-pay | Admitting: Family Medicine

## 2015-04-08 DIAGNOSIS — B001 Herpesviral vesicular dermatitis: Secondary | ICD-10-CM

## 2015-04-09 DIAGNOSIS — B001 Herpesviral vesicular dermatitis: Secondary | ICD-10-CM | POA: Insufficient documentation

## 2015-04-09 MED ORDER — FAMCICLOVIR 500 MG PO TABS: 1500.0000 mg | ORAL_TABLET | Freq: Once | ORAL | Status: DC | PRN

## 2015-04-10 ENCOUNTER — Other Ambulatory Visit: Payer: Self-pay

## 2015-04-10 MED ORDER — ALPRAZOLAM 0.5 MG PO TABS: 0.5000 mg | ORAL_TABLET | Freq: Four times a day (QID) | ORAL | Status: DC | PRN

## 2015-04-28 ENCOUNTER — Encounter: Payer: Self-pay | Admitting: Family Medicine

## 2015-04-28 ENCOUNTER — Ambulatory Visit (INDEPENDENT_AMBULATORY_CARE_PROVIDER_SITE_OTHER): Payer: BLUE CROSS/BLUE SHIELD | Admitting: Family Medicine

## 2015-04-28 ENCOUNTER — Other Ambulatory Visit: Payer: Self-pay | Admitting: Family Medicine

## 2015-04-28 VITALS — BP 114/60 | HR 68 | Temp 98.8°F | Wt 210.0 lb

## 2015-04-28 DIAGNOSIS — R197 Diarrhea, unspecified: Secondary | ICD-10-CM

## 2015-04-28 NOTE — Patient Instructions (Signed)
Go to lab to get instructions on collecting your stools. Bring it back as soon as you are able. Great job staying hydrated through this- one of the most important things.   I think eating yogurt with probiotics is a good idea. Could also try something like align.   We are going to rule out bacteria as a cause but suspect this is post viral good flora loss  Orders Placed This Encounter  Procedures  . C. difficile, PCR    Standing Status: Future     Number of Occurrences:      Standing Expiration Date: 04/27/2016    Order Specific Question:  Is your patient experiencing loose or watery stools (3 or more in 24 hours)?    Answer:  Yes    Order Specific Question:  Has the patient received laxatives in the last 24 hours?    Answer:  No    Order Specific Question:  Has a negative Cdiff test resulted in the last 7 days?    Answer:  No  . Stool culture    Standing Status: Future     Number of Occurrences:      Standing Expiration Date: 04/27/2016  . Fecal lactoferrin    Standing Status: Future     Number of Occurrences:      Standing Expiration Date: 04/27/2016

## 2015-04-28 NOTE — Progress Notes (Signed)
Larry Reddish, MD  Subjective:  Larry Garner is a 59 y.o. year old very pleasant male patient who presents with:  Diarrhea -Patient has been experiencing diarrhea for 1 week. He had a low-grade temperature when this started but he is no longer had any fevers. He i shaving some mild lower abdominal cramping. His diarrhea used to be 5-6 episodes a day but is now down to 3 times in 24 hours. Has tried yogurt with cultures and it. This has provided mild improvement. He still feels like with most meals he eats the food goes right through him but overall is doing better. ROS-no vomiting or nausea. No constipation or CVA tenderness.  Past Medical History-  Patient Active Problem List   Diagnosis Date Noted  . Poorly controlled diabetes mellitus 03/31/2010    Priority: High  . CAD (coronary artery disease) s/p CABG 04/06/2007    Priority: High  . Insomnia 05/27/2014    Priority: Medium  . Asthma, mild intermittent 06/19/2008    Priority: Medium  . ERECTILE DYSFUNCTION, SECONDARY TO MEDICATION 05/17/2007    Priority: Medium  . Hyperlipidemia 04/06/2007    Priority: Medium  . Anxiety state 04/06/2007    Priority: Medium  . Essential hypertension 04/06/2007    Priority: Medium  . Former smoker 11/04/2014    Priority: Low  . Plantar wart of left foot 11/27/2010    Priority: Low  . PSA, INCREASED 09/15/2009    Priority: Low  . Diverticulitis of colon 05/08/2009    Priority: Low  . ANKLE PAIN, CHRONIC 06/05/2007    Priority: Low  . Allergic rhinitis 05/17/2007    Priority: Low  . Fever blister 04/09/2015   Medications- reviewed and updated Current Outpatient Prescriptions  Medication Sig Dispense Refill  . amLODipine (NORVASC) 5 MG tablet TAKE 1 TABLET (5 MG TOTAL) BY MOUTH DAILY. 30 tablet 5  . aspirin 325 MG tablet Take 325 mg by mouth daily.      Marland Kitchen azelastine (ASTELIN) 137 MCG/SPRAY nasal spray Place 2 sprays into both nostrils 2 (two) times daily. Use in each nostril as  directed 30 mL 0  . Azelastine-Fluticasone (DYMISTA) 137-50 MCG/ACT SUSP 1 spray each nostril bid 1 Bottle 5  . bisoprolol-hydrochlorothiazide (ZIAC) 10-6.25 MG per tablet Take 1 tablet by mouth daily. 30 tablet 6  . budesonide-formoterol (SYMBICORT) 160-4.5 MCG/ACT inhaler Inhale 2 puffs into the lungs 2 (two) times daily. 1 Inhaler 3  . CRESTOR 20 MG tablet TAKE 1 TABLET (20 MG TOTAL) BY MOUTH DAILY. 30 tablet 5  . fluticasone (FLONASE) 50 MCG/ACT nasal spray Place 2 sprays into both nostrils daily. 16 g 0  . folic acid (FOLVITE) 161 MCG tablet Take 1,600 mcg by mouth daily.      . Inositol Niacinate (NIACIN FLUSH FREE) 500 MG CAPS Take 1 capsule by mouth daily.      . INVOKAMET [id number] MG TABS TAKE 1 TABLET BY MOUTH 2 (TWO) TIMES DAILY. 60 tablet 6  . losartan (COZAAR) 100 MG tablet TAKE 1 TABLET (100 MG TOTAL) BY MOUTH DAILY. 90 tablet 3  . Multiple Vitamin (MULTIVITAMIN) tablet Take 1 tablet by mouth daily.      Marland Kitchen omega-3 acid ethyl esters (LOVAZA) 1 G capsule Take 1 capsule (1 g total) by mouth 3 (three) times daily. 90 capsule 5  . Pyridoxine HCl (VITAMIN B-6) 500 MG tablet Take 500 mg by mouth daily.      . zaleplon (SONATA) 10 MG capsule TAKE ONE CAPSULE BY MOUTH  AT BEDTIME AS NEEDED FOR SLEEP 30 capsule 4  . ALPRAZolam (XANAX) 0.5 MG tablet Take 1 tablet (0.5 mg total) by mouth every 6 (six) hours as needed. (Patient not taking: Reported on 04/28/2015) 30 tablet 4  . famciclovir (FAMVIR) 500 MG tablet Take 3 tablets (1,500 mg total) by mouth once as needed. At first sign of outbreak of fever blister (Patient not taking: Reported on 04/28/2015) 15 tablet 0  . nitroGLYCERIN (NITROSTAT) 0.4 MG SL tablet Place 0.4 mg under the tongue every 5 (five) minutes as needed for chest pain.     No current facility-administered medications for this visit.    Objective: BP 114/60 mmHg  Pulse 68  Temp(Src) 98.8 F (37.1 C)  Wt 210 lb (95.255 kg) Gen: NAD, resting comfortably, well  appearing Mucous membranes are moist. CV: RRR no murmurs rubs or gallops Lungs: CTAB no crackles, wheeze, rhonchi Abdomen: soft/nontender/nondistended/normal bowel sounds. No rebound or guarding.  Ext: no edema Skin: warm, dry Neuro: grossly normal, moves all extremities  Assessment/Plan:  Diarrhea 1 week of symptoms. Possible viral gastroenteritis, now clearing. Patient may need to repopulate normal flora. Probiotics encouraged and is using these. We will rule out bacterial cause with labs below. Lisinopril 5 use was in May with Levaquin  Return precautions advised.   Orders Placed This Encounter  Procedures  . C. difficile, PCR    Standing Status: Future     Number of Occurrences: 1     Standing Expiration Date: 04/27/2016    Order Specific Question:  Is your patient experiencing loose or watery stools (3 or more in 24 hours)?    Answer:  Yes    Order Specific Question:  Has the patient received laxatives in the last 24 hours?    Answer:  No    Order Specific Question:  Has a negative Cdiff test resulted in the last 7 days?    Answer:  No  . Stool culture    Standing Status: Future     Number of Occurrences: 1     Standing Expiration Date: 04/27/2016  . Fecal lactoferrin    Standing Status: Future     Number of Occurrences: 1     Standing Expiration Date: 04/27/2016

## 2015-04-29 ENCOUNTER — Encounter: Payer: Self-pay | Admitting: Family Medicine

## 2015-04-30 ENCOUNTER — Encounter: Payer: Self-pay | Admitting: Family Medicine

## 2015-04-30 LAB — CLOSTRIDIUM DIFFICILE BY PCR

## 2015-04-30 LAB — FECAL LACTOFERRIN, QUANT: LACTOFERRIN: POSITIVE

## 2015-04-30 MED ORDER — CIPROFLOXACIN HCL 500 MG PO TABS: 500.0000 mg | ORAL_TABLET | Freq: Two times a day (BID) | ORAL | Status: DC

## 2015-04-30 MED ORDER — METRONIDAZOLE 500 MG PO TABS: 500.0000 mg | ORAL_TABLET | Freq: Three times a day (TID) | ORAL | Status: DC

## 2015-04-30 NOTE — Telephone Encounter (Signed)
cipro for diarrhea Flagyl added for likely diverticulitis Follow up at day 10 to see if need to extend course

## 2015-05-02 LAB — STOOL CULTURE

## 2015-05-12 ENCOUNTER — Encounter: Payer: Self-pay | Admitting: Family Medicine

## 2015-05-29 ENCOUNTER — Ambulatory Visit (INDEPENDENT_AMBULATORY_CARE_PROVIDER_SITE_OTHER): Payer: BLUE CROSS/BLUE SHIELD | Admitting: Family Medicine

## 2015-05-29 DIAGNOSIS — Z23 Encounter for immunization: Secondary | ICD-10-CM | POA: Diagnosis not present

## 2015-06-03 ENCOUNTER — Encounter: Payer: Self-pay | Admitting: Family Medicine

## 2015-06-03 ENCOUNTER — Ambulatory Visit (INDEPENDENT_AMBULATORY_CARE_PROVIDER_SITE_OTHER): Payer: BLUE CROSS/BLUE SHIELD | Admitting: Family Medicine

## 2015-06-03 VITALS — BP 132/80 | HR 74 | Temp 100.2°F | Wt 201.0 lb

## 2015-06-03 DIAGNOSIS — E1165 Type 2 diabetes mellitus with hyperglycemia: Secondary | ICD-10-CM | POA: Diagnosis not present

## 2015-06-03 DIAGNOSIS — R1032 Left lower quadrant pain: Secondary | ICD-10-CM | POA: Diagnosis not present

## 2015-06-03 DIAGNOSIS — R509 Fever, unspecified: Secondary | ICD-10-CM

## 2015-06-03 LAB — POCT URINALYSIS DIPSTICK
Bilirubin, UA: NEGATIVE
Ketones, UA: NEGATIVE
Leukocytes, UA: NEGATIVE
Nitrite, UA: NEGATIVE
PH UA: 5.5
Protein, UA: NEGATIVE
RBC UA: NEGATIVE
SPEC GRAV UA: 1.01
UROBILINOGEN UA: 0.2

## 2015-06-03 NOTE — Patient Instructions (Signed)
Labs before you go  Will set up CT scan and call you- if they tell you later than next Monday or Tuesday we will try to move this. Would prefer by end of week.   Hold invokamet on day of test and for at least 3 days after. Do not restart until you get the ok from me. May want to recheck kidney function before restarting  For increasing fevers, worsening pain- advise you to seek care in ED

## 2015-06-03 NOTE — Progress Notes (Signed)
[name], MD  Subjective:  Larry Garner is a 59 y.o. year old very pleasant male patient who presents for/with See problem oriented charting ROS- see HPI. Admits to fatigue and mild shortness of breath- has had some mild wheezing as well.   Past Medical History-  Patient Active Problem List   Diagnosis Date Noted  . Poorly controlled diabetes mellitus ([place]) 03/31/2010    Priority: High  . CAD (coronary artery disease) s/p CABG 04/06/2007    Priority: High  . Insomnia 05/27/2014    Priority: Medium  . Asthma, mild intermittent 06/19/2008    Priority: Medium  . ERECTILE DYSFUNCTION, SECONDARY TO MEDICATION 05/17/2007    Priority: Medium  . Hyperlipidemia 04/06/2007    Priority: Medium  . Anxiety state 04/06/2007    Priority: Medium  . Essential hypertension 04/06/2007    Priority: Medium  . Former smoker 11/04/2014    Priority: Low  . Plantar wart of left foot 11/27/2010    Priority: Low  . PSA, INCREASED 09/15/2009    Priority: Low  . Diverticulitis of colon 05/08/2009    Priority: Low  . ANKLE PAIN, CHRONIC 06/05/2007    Priority: Low  . Allergic rhinitis 05/17/2007    Priority: Low  . Fever blister 04/09/2015    Medications- reviewed and updated Current Outpatient Prescriptions  Medication Sig Dispense Refill  . amLODipine (NORVASC) 5 MG tablet TAKE 1 TABLET (5 MG TOTAL) BY MOUTH DAILY. 30 tablet 5  . aspirin 325 MG tablet Take 325 mg by mouth daily.      Marland Kitchen azelastine (ASTELIN) 137 MCG/SPRAY nasal spray Place 2 sprays into both nostrils 2 (two) times daily. Use in each nostril as directed 30 mL 0  . Azelastine-Fluticasone (DYMISTA) 137-50 MCG/ACT SUSP 1 spray each nostril bid 1 Bottle 5  . bisoprolol-hydrochlorothiazide (ZIAC) 10-6.25 MG per tablet Take 1 tablet by mouth daily. 30 tablet 6  . budesonide-formoterol (SYMBICORT) 160-4.5 MCG/ACT inhaler Inhale 2 puffs into the lungs 2 (two) times daily. 1 Inhaler 3  . CRESTOR 20 MG tablet TAKE 1 TABLET (20 MG  TOTAL) BY MOUTH DAILY. 30 tablet 5  . fluticasone (FLONASE) 50 MCG/ACT nasal spray Place 2 sprays into both nostrils daily. 16 g 0  . folic acid (FOLVITE) 462 MCG tablet Take 1,600 mcg by mouth daily.      . Inositol Niacinate (NIACIN FLUSH FREE) 500 MG CAPS Take 1 capsule by mouth daily.      . INVOKAMET [phone] MG TABS TAKE 1 TABLET BY MOUTH 2 (TWO) TIMES DAILY. 60 tablet 6  . losartan (COZAAR) 100 MG tablet TAKE 1 TABLET (100 MG TOTAL) BY MOUTH DAILY. 90 tablet 3  . Multiple Vitamin (MULTIVITAMIN) tablet Take 1 tablet by mouth daily.      Marland Kitchen omega-3 acid ethyl esters (LOVAZA) 1 G capsule Take 1 capsule (1 g total) by mouth 3 (three) times daily. 90 capsule 5  . Pyridoxine HCl (VITAMIN B-6) 500 MG tablet Take 500 mg by mouth daily.      Marland Kitchen ALPRAZolam (XANAX) 0.5 MG tablet Take 1 tablet (0.5 mg total) by mouth every 6 (six) hours as needed. (Patient not taking: Reported on 04/28/2015) 30 tablet 4  . famciclovir (FAMVIR) 500 MG tablet Take 3 tablets (1,500 mg total) by mouth once as needed. At first sign of outbreak of fever blister (Patient not taking: Reported on 04/28/2015) 15 tablet 0  . nitroGLYCERIN (NITROSTAT) 0.4 MG SL tablet Place 0.4 mg under the tongue every 5 (  five) minutes as needed for chest pain.    . zaleplon (SONATA) 10 MG capsule TAKE ONE CAPSULE BY MOUTH AT BEDTIME AS NEEDED FOR SLEEP (Patient not taking: Reported on 06/03/2015) 30 capsule 4   No current facility-administered medications for this visit.    Objective: BP 132/80 mmHg  Pulse 74  Temp(Src) 100.2 F (37.9 C)  Wt 201 lb (91.173 kg) Gen: NAD, resting comfortably TM, oropharynx normal CV: RRR no murmurs rubs or gallops Lungs: CTAB no crackles, wheeze, rhonchi Abdomen: soft/mild to moderate LLQ and suprapubic pain/nondistended/normal bowel sounds. No rebound or guarding.  Rectal: normal tone, diffusely enlarged prostate, no masses or tenderness Ext: no edema Skin: warm, dry Neuro: grossly normal, moves all  extremities  Assessment/Plan:  Fever and LLQ abdominal pain S:9/19 1 week of diarrhea- possible viral gastroenteritis. Stool studies negative Next day reported symptoms consistent with diverticulitis. LLQ pain and across lower abdomen radiation. Advised visit but patient stated classic symptoms for him. We phoned in cipro and flagyl for patient. 10/3 reported that diarrhea resolved immediatley, short lived cramps. Back to normal  Flu shot 5 days ago- thought maybe asthma or allergies but felt fatigued. Got a fever 101.2 next day. Fever seems to come late in the day- day 4 now. Has not been that high and for example today 100.2 in office. Gets cramps in LLQ along with it rated as mild to moderate. No diarrhea this timDaily bowel movements, no blood, no nausea, vomiting. Has had prostatitis in past- currently dribbling seems more frequent over last few weeks, more urgency than normal  A/P: check Cbc, cmet, lipase. i doubt pancreatic issue but patient is concerned about pancreatic cancer so lipase and CT scan should provide reassurance. Get CT abdomen and pelvis- ? Abscess vs recurrent diverticulitis. Prostate exam nontender and not particularly boggy- doubt prostatitis. Check urine culture with fever. Check x-ray as well with some shortness of breath along with this.   Emergent Return precautions advised.   Orders Placed This Encounter  Procedures  . Urine culture    [facility]  . CT Abdomen Pelvis W Contrast    Standing Status: Future     Number of Occurrences:      Standing Expiration Date: 09/02/2016    Order Specific Question:  If indicated for the ordered procedure, I authorize the administration of contrast media per Radiology protocol    Answer:  Yes    Order Specific Question:  Reason for Exam (SYMPTOM  OR DIAGNOSIS REQUIRED)    Answer:  LLQ abdominal pain intermittent now nearly a month, low grade temperatures and fever at times    Order Specific Question:  Preferred imaging location?     Answer:  [place]  . DG Chest 2 View    Standing Status: Future     Number of Occurrences:      Standing Expiration Date: 08/02/2016    Order Specific Question:  Reason for Exam (SYMPTOM  OR DIAGNOSIS REQUIRED)    Answer:  fever    Order Specific Question:  Preferred imaging location?    Answer:  [facility]  . CBC with Differential/Platelet  . Comprehensive metabolic panel    [place]  . Hemoglobin A1c    [place]  . Lipase  . POCT urinalysis dipstick    Standing Status: Future     Number of Occurrences: 1     Standing Expiration Date: 06/02/2016

## 2015-06-04 ENCOUNTER — Other Ambulatory Visit: Payer: Self-pay | Admitting: Family Medicine

## 2015-06-04 ENCOUNTER — Encounter: Payer: Self-pay | Admitting: Family Medicine

## 2015-06-04 LAB — COMPREHENSIVE METABOLIC PANEL
ALK PHOS: 86 U/L (ref 39–117)
ALT: 100 U/L — AB (ref 0–53)
AST: 89 U/L — ABNORMAL HIGH (ref 0–37)
Albumin: 3.9 g/dL (ref 3.5–5.2)
BILIRUBIN TOTAL: 0.9 mg/dL (ref 0.2–1.2)
BUN: 12 mg/dL (ref 6–23)
CO2: 25 mEq/L (ref 19–32)
Calcium: 9.4 mg/dL (ref 8.4–10.5)
Chloride: 102 mEq/L (ref 96–112)
Creatinine, Ser: 1.17 mg/dL (ref 0.40–1.50)
GFR: 67.59 mL/min (ref >60.00)
GLUCOSE: 243 mg/dL — AB (ref 70–99)
Potassium: 3.3 mEq/L — ABNORMAL LOW (ref 3.5–5.1)
Sodium: 139 mEq/L (ref 135–145)
TOTAL PROTEIN: 6.9 g/dL (ref 6.0–8.3)

## 2015-06-04 LAB — CBC WITH DIFFERENTIAL/PLATELET
BASOS ABS: 0.1 10*3/uL (ref 0.0–0.1)
Basophils Relative: 0.6 % (ref 0.0–3.0)
EOS ABS: 0.1 10*3/uL (ref 0.0–0.7)
EOS PCT: 0.6 % (ref 0.0–5.0)
HCT: 43.3 % (ref 39.0–52.0)
HEMOGLOBIN: 14.6 g/dL (ref 13.0–17.0)
LYMPHS ABS: 5.7 10*3/uL — AB (ref 0.7–4.0)
Lymphocytes Relative: 48.3 % — ABNORMAL HIGH (ref 12.0–46.0)
MCHC: 33.8 g/dL (ref 30.0–36.0)
MCV: 88.5 fl (ref 78.0–100.0)
MONO ABS: 0.8 10*3/uL (ref 0.1–1.0)
Monocytes Relative: 7 % (ref 3.0–12.0)
NEUTROS PCT: 43.5 % (ref 43.0–77.0)
Neutro Abs: 5.1 10*3/uL (ref 1.4–7.7)
Platelets: 208 10*3/uL (ref 150.0–400.0)
RBC: 4.9 Mil/uL (ref 4.22–5.81)
RDW: 14 % (ref 11.5–15.5)
WBC: 11.8 10*3/uL — AB (ref 4.0–10.5)

## 2015-06-04 LAB — HEMOGLOBIN A1C: Hgb A1c MFr Bld: 8 % — ABNORMAL HIGH (ref 4.6–6.5)

## 2015-06-04 LAB — LIPASE: LIPASE: 104 U/L — AB (ref 11.0–59.0)

## 2015-06-05 LAB — URINE CULTURE
COLONY COUNT: NO GROWTH
ORGANISM ID, BACTERIA: NO GROWTH

## 2015-06-05 NOTE — Telephone Encounter (Signed)
Larry Garner can you see why this has not been scheduled?

## 2015-06-06 ENCOUNTER — Other Ambulatory Visit: Payer: BLUE CROSS/BLUE SHIELD

## 2015-06-06 ENCOUNTER — Ambulatory Visit (INDEPENDENT_AMBULATORY_CARE_PROVIDER_SITE_OTHER)
Admission: RE | Admit: 2015-06-06 | Discharge: 2015-06-06 | Disposition: A | Discharge status: Home / Self Care | Payer: BLUE CROSS/BLUE SHIELD | Source: Ambulatory Visit | Attending: Family Medicine | Admitting: Family Medicine

## 2015-06-06 DIAGNOSIS — R1032 Left lower quadrant pain: Secondary | ICD-10-CM | POA: Diagnosis not present

## 2015-06-06 DIAGNOSIS — R509 Fever, unspecified: Secondary | ICD-10-CM | POA: Diagnosis not present

## 2015-06-06 MED ORDER — IOHEXOL 300 MG/ML  SOLN
100.0000 mL | Freq: Once | INTRAMUSCULAR | Status: AC | PRN
  Administered 2015-06-06: 100 mL via INTRAVENOUS

## 2015-06-06 MED ORDER — METRONIDAZOLE 500 MG PO TABS: 500.0000 mg | ORAL_TABLET | Freq: Three times a day (TID) | ORAL | Status: DC

## 2015-06-06 MED ORDER — CIPROFLOXACIN HCL 500 MG PO TABS: 500.0000 mg | ORAL_TABLET | Freq: Two times a day (BID) | ORAL | Status: DC

## 2015-06-06 NOTE — Telephone Encounter (Signed)
Spoke with patient- diverticulitis on CT- will treat with 10-14 day course. Decide on duration based on patient exam in 1 week.   Gallstone on exam- suspect he passed a stone causing LFT and lipase elevation. Would plan on repeat perhaps in a month when not having active GI symptoms.   Also with x-ray showing pulmonary nodule- once he is doing better from the diverticulitis, plan on CT chest noncontrast. Former smoker but 2 pack years quitting many years ago-my suspicion is low for lung cancer

## 2015-06-09 ENCOUNTER — Encounter: Payer: Self-pay | Admitting: Family Medicine

## 2015-06-11 ENCOUNTER — Other Ambulatory Visit: Payer: Self-pay | Admitting: Family Medicine

## 2015-06-13 ENCOUNTER — Ambulatory Visit (INDEPENDENT_AMBULATORY_CARE_PROVIDER_SITE_OTHER): Payer: BLUE CROSS/BLUE SHIELD | Admitting: Family Medicine

## 2015-06-13 ENCOUNTER — Encounter: Payer: Self-pay | Admitting: Family Medicine

## 2015-06-13 VITALS — BP 130/70 | HR 72 | Temp 99.8°F | Wt 211.0 lb

## 2015-06-13 DIAGNOSIS — R509 Fever, unspecified: Secondary | ICD-10-CM

## 2015-06-13 DIAGNOSIS — K5732 Diverticulitis of large intestine without perforation or abscess without bleeding: Secondary | ICD-10-CM | POA: Diagnosis not present

## 2015-06-13 DIAGNOSIS — R911 Solitary pulmonary nodule: Secondary | ICD-10-CM | POA: Diagnosis not present

## 2015-06-13 DIAGNOSIS — E1165 Type 2 diabetes mellitus with hyperglycemia: Secondary | ICD-10-CM

## 2015-06-13 LAB — COMPREHENSIVE METABOLIC PANEL
ALK PHOS: 85 U/L (ref 39–117)
ALT: 53 U/L (ref 0–53)
AST: 39 U/L — AB (ref 0–37)
Albumin: 3.3 g/dL — ABNORMAL LOW (ref 3.5–5.2)
BILIRUBIN TOTAL: 0.6 mg/dL (ref 0.2–1.2)
BUN: 13 mg/dL (ref 6–23)
CALCIUM: 9.1 mg/dL (ref 8.4–10.5)
CO2: 28 meq/L (ref 19–32)
CREATININE: 0.88 mg/dL (ref 0.40–1.50)
Chloride: 99 mEq/L (ref 96–112)
GFR: 93.89 mL/min (ref >60.00)
Glucose, Bld: 260 mg/dL — ABNORMAL HIGH (ref 70–99)
Potassium: 3.7 mEq/L (ref 3.5–5.1)
Sodium: 133 mEq/L — ABNORMAL LOW (ref 135–145)
TOTAL PROTEIN: 6.4 g/dL (ref 6.0–8.3)

## 2015-06-13 LAB — CBC WITH DIFFERENTIAL/PLATELET
BASOS ABS: 0 10*3/uL (ref 0.0–0.1)
Basophils Relative: 0.4 % (ref 0.0–3.0)
EOS ABS: 0.1 10*3/uL (ref 0.0–0.7)
Eosinophils Relative: 0.6 % (ref 0.0–5.0)
HEMATOCRIT: 42.4 % (ref 39.0–52.0)
HEMOGLOBIN: 14.2 g/dL (ref 13.0–17.0)
LYMPHS PCT: 51.8 % — AB (ref 12.0–46.0)
Lymphs Abs: 4.9 10*3/uL — ABNORMAL HIGH (ref 0.7–4.0)
MCHC: 33.4 g/dL (ref 30.0–36.0)
MCV: 88.4 fl (ref 78.0–100.0)
MONOS PCT: 6.5 % (ref 3.0–12.0)
Monocytes Absolute: 0.6 10*3/uL (ref 0.1–1.0)
NEUTROS ABS: 3.9 10*3/uL (ref 1.4–7.7)
Neutrophils Relative %: 40.7 % — ABNORMAL LOW (ref 43.0–77.0)
PLATELETS: 200 10*3/uL (ref 150.0–400.0)
RBC: 4.8 Mil/uL (ref 4.22–5.81)
RDW: 14.8 % (ref 11.5–15.5)
WBC: 9.5 10*3/uL (ref 4.0–10.5)

## 2015-06-13 LAB — LIPASE: Lipase: 28 U/L (ref 11.0–59.0)

## 2015-06-13 NOTE — Assessment & Plan Note (Addendum)
S: patient was drinking more soda than normal as he was dealing with diverticulits as it comforted his abdomen. a1c is up to 8 so poorly controlled. He is compliant with max dose invokamet.  Lab Results  Component Value Date   HGBA1C 8.0* 06/03/2015  A/P: check creatinine today- if stable can restart invokamet after recent contrast load. Would hold again and repeat bmet after next CT chest. Discussed adding amaryl but patient prefers to see if he can correct diet/exercise before adding medication. Also do not want to add medicine given intermittent fever x 2 weeks. amaryl 2mg  planned if not improved by 3 month follow up.

## 2015-06-13 NOTE — Progress Notes (Signed)
[name], MD  Subjective:  Larry Garner is a 59 y.o. year old very pleasant male patient who presents for/with See problem oriented charting ROS- no chest pain, shortness of breath. Does have some sinus congestion but no pressure. No ear pain. No new rash. No new skin lesion. No hypoglycemia or blurry vision. No headache. No extremity weakness/slurred speech/trouble swallowing  Past Medical History-  Patient Active Problem List   Diagnosis Date Noted  . Poorly controlled diabetes mellitus ([place]) 03/31/2010    Priority: High  . CAD (coronary artery disease) s/p CABG 04/06/2007    Priority: High  . Insomnia 05/27/2014    Priority: Medium  . Asthma, mild intermittent 06/19/2008    Priority: Medium  . ERECTILE DYSFUNCTION, SECONDARY TO MEDICATION 05/17/2007    Priority: Medium  . Hyperlipidemia 04/06/2007    Priority: Medium  . Anxiety state 04/06/2007    Priority: Medium  . Essential hypertension 04/06/2007    Priority: Medium  . Fever blister 04/09/2015    Priority: Low  . Former smoker 11/04/2014    Priority: Low  . Plantar wart of left foot 11/27/2010    Priority: Low  . PSA, INCREASED 09/15/2009    Priority: Low  . Diverticulitis of colon 05/08/2009    Priority: Low  . ANKLE PAIN, CHRONIC 06/05/2007    Priority: Low  . Allergic rhinitis 05/17/2007    Priority: Low    Medications- reviewed and updated Current Outpatient Prescriptions  Medication Sig Dispense Refill  . amLODipine (NORVASC) 5 MG tablet TAKE 1 TABLET (5 MG TOTAL) BY MOUTH DAILY. 30 tablet 5  . aspirin 325 MG tablet Take 325 mg by mouth daily.      Marland Kitchen azelastine (ASTELIN) 137 MCG/SPRAY nasal spray Place 2 sprays into both nostrils 2 (two) times daily. Use in each nostril as directed 30 mL 0  . Azelastine-Fluticasone (DYMISTA) 137-50 MCG/ACT SUSP 1 spray each nostril bid 1 Bottle 5  . bisoprolol-hydrochlorothiazide (ZIAC) 10-6.25 MG tablet TAKE 1 TABLET BY MOUTH EVERY DAY 30 tablet 6  .  budesonide-formoterol (SYMBICORT) 160-4.5 MCG/ACT inhaler Inhale 2 puffs into the lungs 2 (two) times daily. 1 Inhaler 3  . ciprofloxacin (CIPRO) 500 MG tablet Take 1 tablet (500 mg total) by mouth 2 (two) times daily. 28 tablet 0  . CRESTOR 20 MG tablet TAKE 1 TABLET (20 MG TOTAL) BY MOUTH DAILY. 30 tablet 5  . fluticasone (FLONASE) 50 MCG/ACT nasal spray Place 2 sprays into both nostrils daily. 16 g 0  . folic acid (FOLVITE) 094 MCG tablet Take 1,600 mcg by mouth daily.      . Inositol Niacinate (NIACIN FLUSH FREE) 500 MG CAPS Take 1 capsule by mouth daily.      . INVOKAMET [phone] MG TABS TAKE 1 TABLET BY MOUTH 2 (TWO) TIMES DAILY. 60 tablet 6  . losartan (COZAAR) 100 MG tablet TAKE 1 TABLET (100 MG TOTAL) BY MOUTH DAILY. 90 tablet 3  . metroNIDAZOLE (FLAGYL) 500 MG tablet Take 1 tablet (500 mg total) by mouth 3 (three) times daily. 42 tablet 0  . Multiple Vitamin (MULTIVITAMIN) tablet Take 1 tablet by mouth daily.      Marland Kitchen omega-3 acid ethyl esters (LOVAZA) 1 G capsule Take 1 capsule (1 g total) by mouth 3 (three) times daily. 90 capsule 5  . Pyridoxine HCl (VITAMIN B-6) 500 MG tablet Take 500 mg by mouth daily.      . zaleplon (SONATA) 10 MG capsule TAKE ONE CAPSULE BY MOUTH AT BEDTIME  AS NEEDED FOR SLEEP 30 capsule 4  . ALPRAZolam (XANAX) 0.5 MG tablet Take 1 tablet (0.5 mg total) by mouth every 6 (six) hours as needed. (Patient not taking: Reported on 04/28/2015) 30 tablet 4  . famciclovir (FAMVIR) 500 MG tablet Take 3 tablets (1,500 mg total) by mouth once as needed. At first sign of outbreak of fever blister (Patient not taking: Reported on 04/28/2015) 15 tablet 0  . nitroGLYCERIN (NITROSTAT) 0.4 MG SL tablet Place 0.4 mg under the tongue every 5 (five) minutes as needed for chest pain.     No current facility-administered medications for this visit.    Objective: BP 130/70 mmHg  Pulse 72  Temp(Src) 99.8 F (37.7 C)  Wt 211 lb (95.709 kg) Gen: NAD, resting comfortably, well  appearing Intended to exam HEENT- did not complete before patient left as intended CV: RRR no murmurs rubs or gallops Lungs: CTAB no crackles, wheeze, rhonchi Abdomen: soft/nontender/nondistended/normal bowel sounds. No rebound or guarding.  Ext: no edema Skin: warm, dry Neuro: grossly normal, moves all extremities  Assessment/Plan:  Poorly controlled diabetes mellitus ([place]) S: patient was drinking more soda than normal as he was dealing with diverticulits as it comforted his abdomen. a1c is up to 8 so poorly controlled. He is compliant with max dose invokamet.  Lab Results  Component Value Date   HGBA1C 8.0* 06/03/2015  A/P: check creatinine today- if stable can restart invokamet after recent contrast load. Would hold again and repeat bmet after next CT chest. Discussed adding amaryl but patient prefers to see if he can correct diet/exercise before adding medication. Also do not want to add medicine given intermittent fever x 2 weeks. amaryl 51m planned if not improved by 3 month follow up.   Diverticulitis follow up/continued fever S: Patient states abdominal pain was improving some before he even started the antibiotic combo cipro/flagyl but shortly after he has not experienced any cramps or irregular bowel movements. Occasional twinge of pain but short lived and much better and no pain with palpation of abdomen. No diarrhea/nausea/vomiting.   Despite abdomen feeling better, Every evening around 5pm gets a temperature up to 101, resolves with tylenol within 4 hours. This has essentially been going on since 21st and occurs almost everyday. Occasional night sweats- but on and off for years.  No weight loss A/P: Continued fever despite week of diverticulitis treatment. CT scan showed no source of infection other than diverticulitis. Fever nightly basically 5-9 pm then resolves with tylenol. No obvious cause per history or prior labs. Did have gallstone but gallbladder otherwise normal and  patient with no RUQ pain. CXR showed nodule and we will get CT with contrast given concern potential malignancy with continued fever. Will also repeat cbc,cmet, lipase given prior elevated LFT and lipase though mildly elevated- could have passed gallstone. Finish 14 day course of cipro/flagyl and patient to reach out to me. If continued fevers despite 2 weeks of treatment, would embark on further investigation for fever without clear cause including: blood cultures x 2 peripheral, ESR, CRP LDH, HIV antibody, HIV viral load, RF, heterophile antibody test, creatine phosphokinase, ANA, tb skin test, SPEP. Already would have had CT abd/pelvis and chest though could consider abdominal repeat if needed (abscess formation after diverticulitis)?. Other option would be to consider ID consult. No neuro symptoms- doubt CT or MRI brain high yield.   Emergent ED/911 precautions advised. He will update me as per AVS   Orders Placed This Encounter  Procedures  .  CT Chest W Contrast    SS/ Hilda Blades [phone] xt [id number]/ [employer]/ Diabetic? meds / getting labs      Standing Status: Future     Number of Occurrences:      Standing Expiration Date: 08/12/2016    Scheduling Instructions:     Within a week please    Order Specific Question:  If indicated for the ordered procedure, I authorize the administration of contrast media per Radiology protocol    Answer:  Yes    Order Specific Question:  Reason for Exam (SYMPTOM  OR DIAGNOSIS REQUIRED)    Answer:  nodule follow up. Also fever x 2 weeks- despite treatment for diverticulitis    Order Specific Question:  Preferred imaging location?    Answer:  [address]  . CBC with Differential/Platelet  . Comprehensive metabolic panel    [place]  . Lipase

## 2015-06-13 NOTE — Patient Instructions (Addendum)
Finish antibiotics  Update me on fevers a day after last antibiotic or next Thursday- whichever comes first  Ordered CT chest  Restart invokamet if kidney function ok today, will hold until repeat kidney function test after next CT (at least 3 days after)  We will embark on deeper workup if we need to

## 2015-06-15 ENCOUNTER — Encounter: Payer: Self-pay | Admitting: Family Medicine

## 2015-06-16 ENCOUNTER — Ambulatory Visit (INDEPENDENT_AMBULATORY_CARE_PROVIDER_SITE_OTHER)
Admission: RE | Admit: 2015-06-16 | Discharge: 2015-06-16 | Disposition: A | Discharge status: Home / Self Care | Payer: BLUE CROSS/BLUE SHIELD | Source: Ambulatory Visit | Attending: Family Medicine | Admitting: Family Medicine

## 2015-06-16 DIAGNOSIS — R911 Solitary pulmonary nodule: Secondary | ICD-10-CM

## 2015-06-16 MED ORDER — IOHEXOL 300 MG/ML  SOLN
80.0000 mL | Freq: Once | INTRAMUSCULAR | Status: AC | PRN
  Administered 2015-06-16: 80 mL via INTRAVENOUS

## 2015-06-18 ENCOUNTER — Telehealth: Payer: Self-pay | Admitting: Family Medicine

## 2015-06-18 DIAGNOSIS — R911 Solitary pulmonary nodule: Secondary | ICD-10-CM

## 2015-06-18 NOTE — Telephone Encounter (Signed)
PET CT planned and ordered Spoke with radiology- only other option would be biopsy and given chance benign- makes more sense to go noninvasive

## 2015-06-23 ENCOUNTER — Encounter: Payer: Self-pay | Admitting: Family Medicine

## 2015-06-30 ENCOUNTER — Ambulatory Visit (HOSPITAL_COMMUNITY)
Admission: RE | Admit: 2015-06-30 | Discharge: 2015-06-30 | Disposition: A | Discharge status: Home / Self Care | Payer: BLUE CROSS/BLUE SHIELD | Source: Ambulatory Visit | Attending: Family Medicine | Admitting: Family Medicine

## 2015-06-30 ENCOUNTER — Encounter: Payer: Self-pay | Admitting: Family Medicine

## 2015-06-30 DIAGNOSIS — K8020 Calculus of gallbladder without cholecystitis without obstruction: Secondary | ICD-10-CM | POA: Diagnosis not present

## 2015-06-30 DIAGNOSIS — N400 Enlarged prostate without lower urinary tract symptoms: Secondary | ICD-10-CM | POA: Diagnosis not present

## 2015-06-30 DIAGNOSIS — R911 Solitary pulmonary nodule: Secondary | ICD-10-CM | POA: Insufficient documentation

## 2015-06-30 DIAGNOSIS — Z0189 Encounter for other specified special examinations: Secondary | ICD-10-CM | POA: Insufficient documentation

## 2015-06-30 DIAGNOSIS — K639 Disease of intestine, unspecified: Secondary | ICD-10-CM | POA: Insufficient documentation

## 2015-06-30 LAB — GLUCOSE, CAPILLARY: GLUCOSE-CAPILLARY: 274 mg/dL — AB (ref 65–99)

## 2015-06-30 MED ORDER — FLUDEOXYGLUCOSE F - 18 (FDG) INJECTION
10.3000 | Freq: Once | INTRAVENOUS | Status: DC | PRN
  Administered 2015-06-30: 10.3 via INTRAVENOUS
  Filled 2015-06-30: qty 10.3

## 2015-07-01 ENCOUNTER — Other Ambulatory Visit: Payer: Self-pay | Admitting: Family Medicine

## 2015-07-01 DIAGNOSIS — K5732 Diverticulitis of large intestine without perforation or abscess without bleeding: Secondary | ICD-10-CM

## 2015-07-02 ENCOUNTER — Encounter: Payer: Self-pay | Admitting: Family Medicine

## 2015-07-08 ENCOUNTER — Ambulatory Visit (INDEPENDENT_AMBULATORY_CARE_PROVIDER_SITE_OTHER): Payer: BLUE CROSS/BLUE SHIELD | Admitting: Gastroenterology

## 2015-07-08 ENCOUNTER — Encounter: Payer: Self-pay | Admitting: Gastroenterology

## 2015-07-08 VITALS — BP 106/68 | HR 76 | Ht 71.0 in | Wt 199.8 lb

## 2015-07-08 DIAGNOSIS — K5732 Diverticulitis of large intestine without perforation or abscess without bleeding: Secondary | ICD-10-CM

## 2015-07-08 DIAGNOSIS — Z86010 Personal history of colonic polyps: Secondary | ICD-10-CM | POA: Diagnosis not present

## 2015-07-08 DIAGNOSIS — R933 Abnormal findings on diagnostic imaging of other parts of digestive tract: Secondary | ICD-10-CM

## 2015-07-08 DIAGNOSIS — R948 Abnormal results of function studies of other organs and systems: Secondary | ICD-10-CM

## 2015-07-08 NOTE — Patient Instructions (Signed)
You will be due for a recall colonoscopy in 10/2018. We will send you a reminder in the mail when it gets closer to that time.  Thank you for choosing me and [facility].  Pricilla Riffle. Dagoberto Ligas., MD., Marval Regal

## 2015-07-08 NOTE — Progress Notes (Signed)
    History of Present Illness: This is a 59 year old male with recent diverticulitis treated with 2 courses of antibiotics. His symptoms completely resolved about one month ago and he has had no gastrointestinal complaints since that time. I reviewed his abd/pelvic CT scan performed on 06/06/2015 and PET scan performed on 06/30/2015 showing a small area of sigmoid colon inflammatory changes and sigmoid colon wall thickening. He underwent colonoscopy in 10/2013 showing moderate sigmoid and descending colon diverticulosis, one small adenomatous colon polyp and internal hemorrhoids.  Current Medications, Allergies, Past Medical History, Past Surgical History, Family History and Social History were reviewed in [employer] record.  Physical Exam: General: Well developed, well nourished, no acute distress Head: Normocephalic and atraumatic Eyes:  sclerae anicteric, EOMI Ears: Normal auditory acuity Mouth: No deformity or lesions Lungs: Clear throughout to auscultation Heart: Regular rate and rhythm; no murmurs, rubs or bruits Abdomen: Soft, non tender and non distended. No masses, hepatosplenomegaly or hernias noted. Normal Bowel sounds Musculoskeletal: Symmetrical with no gross deformities  Pulses:  Normal pulses noted Extremities: No clubbing, cyanosis, edema or deformities noted Neurological: Alert oriented x 4, grossly nonfocal Psychological:  Alert and cooperative. Normal mood and affect  Assessment and Recommendations:  1. Resolved diverticulitis with imaging findings consistent with acute diverticulitis and inflammatory changes of resolving diverticulitis. Given his colonoscopy in 2015, the imaging findings consistent with acute diverticulitis/resolving diverticulitis and the absence of any gastrointestinal complaints for one month, I do not feel that any further evaluation is needed at this time. If he develops any new gastrointestinal complaints we will reevaluate.  Long-term high fiber diet with adequate daily water intake.  2. Personal history of adenomatous colon polyps. Five-year interval surveillance colonoscopy recommended March 2020.

## 2015-07-14 ENCOUNTER — Encounter: Payer: Self-pay | Admitting: Family Medicine

## 2015-07-18 ENCOUNTER — Ambulatory Visit (INDEPENDENT_AMBULATORY_CARE_PROVIDER_SITE_OTHER): Payer: BLUE CROSS/BLUE SHIELD | Admitting: Family Medicine

## 2015-07-18 DIAGNOSIS — Z23 Encounter for immunization: Secondary | ICD-10-CM

## 2015-07-21 ENCOUNTER — Other Ambulatory Visit: Payer: Self-pay | Admitting: Family Medicine

## 2015-07-23 ENCOUNTER — Other Ambulatory Visit: Payer: Self-pay | Admitting: Family Medicine

## 2015-08-05 ENCOUNTER — Other Ambulatory Visit: Payer: Self-pay | Admitting: Family Medicine

## 2015-08-05 MED ORDER — ROSUVASTATIN CALCIUM 20 MG PO TABS: ORAL_TABLET | ORAL | Status: DC

## 2015-09-01 ENCOUNTER — Other Ambulatory Visit: Payer: Self-pay | Admitting: Family Medicine

## 2015-09-01 ENCOUNTER — Encounter: Payer: Self-pay | Admitting: Family Medicine

## 2015-09-01 NOTE — Telephone Encounter (Signed)
Refill ok? 

## 2015-09-01 NOTE — Telephone Encounter (Signed)
Yes thanks 

## 2015-09-02 ENCOUNTER — Other Ambulatory Visit: Payer: Self-pay | Admitting: Family Medicine

## 2015-09-02 MED ORDER — ALPRAZOLAM 0.5 MG PO TABS: 0.5000 mg | ORAL_TABLET | Freq: Four times a day (QID) | ORAL | Status: DC | PRN

## 2015-09-02 NOTE — Telephone Encounter (Signed)
Refill ok? 

## 2015-09-02 NOTE — Telephone Encounter (Signed)
Yes thanks 

## 2015-10-29 ENCOUNTER — Other Ambulatory Visit (INDEPENDENT_AMBULATORY_CARE_PROVIDER_SITE_OTHER): Payer: BLUE CROSS/BLUE SHIELD

## 2015-10-29 DIAGNOSIS — E1165 Type 2 diabetes mellitus with hyperglycemia: Secondary | ICD-10-CM

## 2015-10-29 DIAGNOSIS — Z0000 Encounter for general adult medical examination without abnormal findings: Secondary | ICD-10-CM | POA: Diagnosis not present

## 2015-10-29 LAB — LIPID PANEL
CHOLESTEROL: 107 mg/dL (ref 0–200)
HDL: 28.3 mg/dL — AB (ref >39.00)
LDL Cholesterol: 47 mg/dL (ref 0–99)
NonHDL: 78.81
TRIGLYCERIDES: 158 mg/dL — AB (ref 0.0–149.0)
Total CHOL/HDL Ratio: 4
VLDL: 31.6 mg/dL (ref 0.0–40.0)

## 2015-10-29 LAB — HEPATIC FUNCTION PANEL
ALT: 28 U/L (ref 0–53)
AST: 20 U/L (ref 0–37)
Albumin: 4.2 g/dL (ref 3.5–5.2)
Alkaline Phosphatase: 74 U/L (ref 39–117)
BILIRUBIN DIRECT: 0.2 mg/dL (ref 0.0–0.3)
Total Bilirubin: 0.8 mg/dL (ref 0.2–1.2)
Total Protein: 7.1 g/dL (ref 6.0–8.3)

## 2015-10-29 LAB — CBC WITH DIFFERENTIAL/PLATELET
BASOS PCT: 0.5 % (ref 0.0–3.0)
Basophils Absolute: 0 10*3/uL (ref 0.0–0.1)
EOS ABS: 0.3 10*3/uL (ref 0.0–0.7)
Eosinophils Relative: 4.1 % (ref 0.0–5.0)
HCT: 44.5 % (ref 39.0–52.0)
HEMOGLOBIN: 15.2 g/dL (ref 13.0–17.0)
LYMPHS ABS: 2.7 10*3/uL (ref 0.7–4.0)
Lymphocytes Relative: 35 % (ref 12.0–46.0)
MCHC: 34.2 g/dL (ref 30.0–36.0)
MCV: 88.3 fl (ref 78.0–100.0)
MONO ABS: 0.6 10*3/uL (ref 0.1–1.0)
Monocytes Relative: 8 % (ref 3.0–12.0)
NEUTROS PCT: 52.4 % (ref 43.0–77.0)
Neutro Abs: 4 10*3/uL (ref 1.4–7.7)
Platelets: 236 10*3/uL (ref 150.0–400.0)
RBC: 5.04 Mil/uL (ref 4.22–5.81)
RDW: 13 % (ref 11.5–15.5)
WBC: 7.6 10*3/uL (ref 4.0–10.5)

## 2015-10-29 LAB — POC URINALSYSI DIPSTICK (AUTOMATED)
Bilirubin, UA: NEGATIVE
Ketones, UA: NEGATIVE
LEUKOCYTES UA: NEGATIVE
NITRITE UA: NEGATIVE
PH UA: 5.5
PROTEIN UA: NEGATIVE
RBC UA: NEGATIVE
Spec Grav, UA: 1.015
UROBILINOGEN UA: 0.2

## 2015-10-29 LAB — BASIC METABOLIC PANEL
BUN: 13 mg/dL (ref 6–23)
CHLORIDE: 105 meq/L (ref 96–112)
CO2: 26 mEq/L (ref 19–32)
CREATININE: 0.87 mg/dL (ref 0.40–1.50)
Calcium: 9.5 mg/dL (ref 8.4–10.5)
GFR: 95.02 mL/min (ref >60.00)
GLUCOSE: 199 mg/dL — AB (ref 70–99)
Potassium: 3.8 mEq/L (ref 3.5–5.1)
Sodium: 139 mEq/L (ref 135–145)

## 2015-10-29 LAB — TSH: TSH: 0.24 u[IU]/mL — AB (ref 0.35–4.50)

## 2015-10-29 LAB — HEMOGLOBIN A1C: Hgb A1c MFr Bld: 9.5 % — ABNORMAL HIGH (ref 4.6–6.5)

## 2015-10-29 LAB — PSA: PSA: 1.21 ng/mL (ref 0.10–4.00)

## 2015-11-04 ENCOUNTER — Other Ambulatory Visit: Payer: Self-pay | Admitting: Family Medicine

## 2015-11-04 MED ORDER — NITROGLYCERIN 0.4 MG SL SUBL: 0.4000 mg | SUBLINGUAL_TABLET | SUBLINGUAL | Status: DC | PRN

## 2015-11-05 ENCOUNTER — Ambulatory Visit (INDEPENDENT_AMBULATORY_CARE_PROVIDER_SITE_OTHER): Payer: BLUE CROSS/BLUE SHIELD | Admitting: Family Medicine

## 2015-11-05 ENCOUNTER — Encounter: Payer: Self-pay | Admitting: Family Medicine

## 2015-11-05 VITALS — BP 122/70 | HR 62 | Temp 98.6°F | Ht 71.0 in | Wt 202.0 lb

## 2015-11-05 DIAGNOSIS — Z23 Encounter for immunization: Secondary | ICD-10-CM | POA: Diagnosis not present

## 2015-11-05 DIAGNOSIS — I2510 Atherosclerotic heart disease of native coronary artery without angina pectoris: Secondary | ICD-10-CM | POA: Diagnosis not present

## 2015-11-05 DIAGNOSIS — Z0000 Encounter for general adult medical examination without abnormal findings: Secondary | ICD-10-CM

## 2015-11-05 DIAGNOSIS — R7989 Other specified abnormal findings of blood chemistry: Secondary | ICD-10-CM | POA: Insufficient documentation

## 2015-11-05 DIAGNOSIS — Z0001 Encounter for general adult medical examination with abnormal findings: Secondary | ICD-10-CM

## 2015-11-05 DIAGNOSIS — R6889 Other general symptoms and signs: Secondary | ICD-10-CM

## 2015-11-05 DIAGNOSIS — E1165 Type 2 diabetes mellitus with hyperglycemia: Secondary | ICD-10-CM | POA: Diagnosis not present

## 2015-11-05 DIAGNOSIS — R946 Abnormal results of thyroid function studies: Secondary | ICD-10-CM

## 2015-11-05 MED ORDER — GLUCOSE BLOOD VI STRP: ORAL_STRIP | Status: DC

## 2015-11-05 MED ORDER — OMEGA-3-ACID ETHYL ESTERS 1 G PO CAPS: 1.0000 g | ORAL_CAPSULE | Freq: Three times a day (TID) | ORAL | Status: DC

## 2015-11-05 MED ORDER — INOSITOL NIACINATE 500 MG PO CAPS: 1.0000 | ORAL_CAPSULE | Freq: Every day | ORAL | Status: DC

## 2015-11-05 MED ORDER — GLIMEPIRIDE 2 MG PO TABS: 2.0000 mg | ORAL_TABLET | Freq: Every day | ORAL | Status: DC

## 2015-11-05 NOTE — Progress Notes (Signed)
Larry Reddish, MD Phone: [phone]  Subjective:  Patient presents today for their annual physical. Chief complaint-noted.   See problem oriented charting- ROS- full  review of systems was completed and negative including: No chest pain or shortness of breath. No headache or blurry vision.   The following were reviewed and entered/updated in epic: Past Medical History  Diagnosis Date  . Anxiety   . HLD (hyperlipidemia)   . HTN (hypertension)   . History of heart attack   . Allergic rhinitis   . Diabetes ([place])   . Asthma   . Tubular adenoma of colon 09/2008  . Diverticulitis    Patient Active Problem List   Diagnosis Date Noted  . Poorly controlled diabetes mellitus ([place]) 03/31/2010    Priority: High  . CAD (coronary artery disease) s/p CABG 04/06/2007    Priority: High  . Insomnia 05/27/2014    Priority: Medium  . Asthma, mild intermittent 06/19/2008    Priority: Medium  . ERECTILE DYSFUNCTION, SECONDARY TO MEDICATION 05/17/2007    Priority: Medium  . Hyperlipidemia 04/06/2007    Priority: Medium  . Anxiety state 04/06/2007    Priority: Medium  . Essential hypertension 04/06/2007    Priority: Medium  . Fever blister 04/09/2015    Priority: Low  . Former smoker 11/04/2014    Priority: Low  . Plantar wart of left foot 11/27/2010    Priority: Low  . PSA, INCREASED 09/15/2009    Priority: Low  . Diverticulitis of colon 05/08/2009    Priority: Low  . ANKLE PAIN, CHRONIC 06/05/2007    Priority: Low  . Allergic rhinitis 05/17/2007    Priority: Low   Past Surgical History  Procedure Laterality Date  . 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 Coronary angioplasty      Family History  Problem Relation Age of Onset  . Hypertension Mother   . Alzheimer's disease Mother   . Hyperlipidemia Father   . Hypertension Father   . Colon polyps Father   . Colon cancer Paternal Aunt   . Pancreatic cancer Neg Hx   . Stomach cancer Neg Hx     Medications- reviewed  and updated Current Outpatient Prescriptions  Medication Sig Dispense Refill  . ALPRAZolam (XANAX) 0.5 MG tablet Take 1 tablet (0.5 mg total) by mouth every 6 (six) hours as needed. 30 tablet 5  . amLODipine (NORVASC) 5 MG tablet TAKE 1 TABLET (5 MG TOTAL) BY MOUTH DAILY. 30 tablet 5  . aspirin 325 MG tablet Take 325 mg by mouth daily.      Marland Kitchen azelastine (ASTELIN) 137 MCG/SPRAY nasal spray Place 2 sprays into both nostrils 2 (two) times daily. Use in each nostril as directed 30 mL 0  . Azelastine-Fluticasone (DYMISTA) 137-50 MCG/ACT SUSP 1 spray each nostril bid 1 Bottle 5  . bisoprolol-hydrochlorothiazide (ZIAC) 10-6.25 MG tablet TAKE 1 TABLET BY MOUTH EVERY DAY 30 tablet 6  . budesonide-formoterol (SYMBICORT) 160-4.5 MCG/ACT inhaler Inhale 2 puffs into the lungs 2 (two) times daily. 1 Inhaler 3  . ciprofloxacin (CIPRO) 500 MG tablet Take 1 tablet (500 mg total) by mouth 2 (two) times daily. 28 tablet 0  . famciclovir (FAMVIR) 500 MG tablet TAKE 3 TABLETS BY MOUTH EVERY DAY AS NEEDED AT FIRST SIGN OF OUTBREAK OF FEVER BLISTER 15 tablet 3  . fluticasone (FLONASE) 50 MCG/ACT nasal spray Place 2 sprays into both nostrils daily. 16 g 0  . folic acid (FOLVITE) [id number] MCG tablet Take 1,600 mcg by mouth daily.      Marland Kitchen  Inositol Niacinate (NIACIN FLUSH FREE) 500 MG CAPS Take 1 capsule by mouth daily.      . INVOKAMET [phone] MG TABS TAKE 1 TABLET BY MOUTH TWICE A DAY 60 tablet 5  . losartan (COZAAR) 100 MG tablet TAKE 1 TABLET (100 MG TOTAL) BY MOUTH DAILY. 90 tablet 3  . metroNIDAZOLE (FLAGYL) 500 MG tablet Take 1 tablet (500 mg total) by mouth 3 (three) times daily. 42 tablet 0  . Multiple Vitamin (MULTIVITAMIN) tablet Take 1 tablet by mouth daily.      . nitroGLYCERIN (NITROSTAT) 0.4 MG SL tablet Place 1 tablet (0.4 mg total) under the tongue every 5 (five) minutes as needed for chest pain. 30 tablet 4  . omega-3 acid ethyl esters (LOVAZA) 1 G capsule Take 1 capsule (1 g total) by mouth 3 (three) times  daily. 90 capsule 5  . Pyridoxine HCl (VITAMIN B-6) 500 MG tablet Take 500 mg by mouth daily.      . rosuvastatin (CRESTOR) 20 MG tablet TAKE 1 TABLET (20 MG TOTAL) BY MOUTH DAILY. 90 tablet 1  . zaleplon (SONATA) 10 MG capsule TAKE ONE CAPSULE BY MOUTH AT BEDTIME AS NEEDED FOR SLEEEP 30 capsule 5   No current facility-administered medications for this visit.    Allergies-reviewed and updated Allergies  Allergen Reactions  . Morphine Sulfate     REACTION: nausea    Social History   Social History  . Marital Status: Married    Spouse Name: N/A  . Number of Children: N/A  . Years of Education: N/A   Occupational History  . Banker    Social History Main Topics  . Smoking status: Former Smoker -- 0.50 packs/day for 4 years    Types: Cigarettes    Quit date: 09/29/1977  . Smokeless tobacco: Never Used     Comment: quit on 1980  . Alcohol Use: 0.6 oz/week    1 Standard drinks or equivalent per week  . Drug Use: No  . Sexual Activity: Yes   Other Topics Concern  . None   Social History Narrative   Married  In 1981 with 2 kids (son and daughter). No grandkids.       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 Oropharynx normal Neck: no thyromegaly CV: RRR no murmurs rubs or gallops Lungs: CTAB no crackles, wheeze, rhonchi Abdomen: soft/nontender/nondistended/normal bowel sounds. No rebound or guarding.  Rectal: normal tone, normal prostate, no masses or tenderness Ext: no edema Skin: warm, dry Neuro: grossly normal, moves all extremities, PERRLA  Diabetic Foot Exam - Simple   Simple Foot Form  Diabetic Foot exam was performed with the following findings:  Yes 11/05/2015  9:20 AM  Visual Inspection  No  deformities, no ulcerations, no other skin breakdown bilaterally:  Yes  Sensation Testing  Intact to touch and monofilament testing bilaterally:  Yes  Pulse Check  Posterior Tibialis and Dorsalis pulse intact bilaterally:  Yes  Comments     Assessment/Plan:  60 y.o. male presenting for annual physical.  Health Maintenance counseling: 1. Anticipatory guidance: Patient counseled regarding regular dental exams 2x a year Dr. Ronnald Ramp, eye exams in July each year, wearing seatbelts.  2. Risk factor reduction:  Advised patient of need for regular exercise and diet rich and fruits and vegetables to reduce risk of heart attack and stroke. Exercise lacking- planning to work on it. Food choices have been reasonable some poor beverage choices- sweat tea 3. Immunizations/screenings/ancillary studies Immunization History  Administered Date(s) Administered  . Influenza Split 05/12/2011, 04/25/2012  . Influenza Whole 05/17/2007, 05/10/2008, 06/06/2009, 06/10/2010  . Influenza,inj,Quad PF,36+ Mos 05/16/2013, 05/27/2014, 05/29/2015  . Pneumococcal Conjugate-13 08/14/2014  . Pneumococcal Polysaccharide-23 11/05/2015  . Td 08/09/2002  . Tdap 10/20/2012  . Zoster 07/18/2015   Health Maintenance Due  Topic Date Due  . Hepatitis C Screening - gave blood to [facility] several years ago 10/09/54  . PNEUMOCOCCAL POLYSACCHARIDE VACCINE (1)- today 06/14/1957  . FOOT EXAM - today normal 11/04/2015   4. Prostate cancer screening- low risk based off rectal and PSA trend  Lab Results  Component Value Date   PSA 1.21 10/29/2015   PSA 0.84 10/28/2014   PSA 1.34 10/15/2013   5. Colon cancer screening - 10/26/13 with 5 year follow up 6. Skin cancer screening- low risk skin exam- lipoma unchanged on neck. No dermatologist  Hypertension controlled Asthma controlled Watch lipoma on neck- no change  Poorly controlled diabetes mellitus ([place]) S: poorly controlled. On metformin 1g BID and invokana 300mg  through  invokamet BID Exercise and diet- diet ok, exercise lacking  Lab Results  Component Value Date   HGBA1C 9.5* 10/29/2015   HGBA1C 8.0* 06/03/2015   HGBA1C 7.3* 01/27/2015   A/P: increase exercise, continue current medicine, add amaryl 2mg   CAD (coronary artery disease) s/p CABG Asymptomatic. On aspirin and statin. Consider updating stress test in next few visits.   Low TSH level Lab Results  Component Value Date   TSH 0.24* 10/29/2015  check TSH T3 and T4 in 3 months. Consider thyroid ultrasound   Return in about 3 months (around 02/05/2016) for with labs 01/30/16 or later. Return precautions advised.   Orders Placed This Encounter  Procedures  . Pneumococcal polysaccharide vaccine 23-valent greater than or equal to 2yo subcutaneous/IM  . Hemoglobin A1c    [place]    Standing Status: Future     Number of Occurrences:      Standing Expiration Date: 11/04/2016  . TSH    Standing Status: Future     Number of Occurrences:      Standing Expiration Date: 11/04/2016  . T3, free    Standing Status: Future     Number of Occurrences:      Standing Expiration Date: 11/04/2016  . T4, free    [place]    Standing Status: Future     Number of Occurrences:      Standing Expiration Date: 11/04/2016    Meds ordered this encounter  Medications  . omega-3 acid ethyl esters (LOVAZA) 1 g capsule    Sig: Take 1 capsule (1 g total) by mouth 3 (three) times daily.    Dispense:  90 capsule    Refill:  3  . Inositol Niacinate (NIACIN FLUSH FREE) 500 MG CAPS    Sig: Take 1 capsule (500 mg total) by mouth daily.    Dispense:  180 each    Refill:  6  . glimepiride (AMARYL) 2 MG tablet    Sig: Take 1 tablet (2 mg total) by mouth daily before breakfast.    Dispense:  30 tablet    Refill:  3

## 2015-11-05 NOTE — Assessment & Plan Note (Signed)
Lab Results  Component Value Date   TSH 0.24* 10/29/2015  check TSH T3 and T4 in 3 months. Consider thyroid ultrasound

## 2015-11-05 NOTE — Patient Instructions (Addendum)
Pneumovax23 received today.  Start amaryl 2mg . This can cause low blood sugar. Please read information below. Larry Garner will teach you how to check your blood sugar. You might want to check once a week just to get an idea what your sugars are doing and definitely anytime you are concerned about it being low.   Hypoglycemia Hypoglycemia occurs when the glucose in your blood is too low. Glucose is a type of sugar that is your body's main energy source. Hormones, such as insulin and glucagon, control the level of glucose in the blood. Insulin lowers blood glucose and glucagon increases blood glucose. Having too much insulin in your blood stream, or not eating enough food containing sugar, can result in hypoglycemia. Hypoglycemia can happen to people with or without diabetes. It can develop quickly and can be a medical emergency.  CAUSES   Missing or delaying meals.  Not eating enough carbohydrates at meals.  Taking too much diabetes medicine.  Not timing your oral diabetes medicine or insulin doses with meals, snacks, and exercise.  Nausea and vomiting.  Certain medicines.  Severe illnesses, such as hepatitis, kidney disorders, and certain eating disorders.  Increased activity or exercise without eating something extra or adjusting medicines.  Drinking too much alcohol.  A nerve disorder that affects body functions like your heart rate, blood pressure, and digestion (autonomic neuropathy).  A condition where the stomach muscles do not function properly (gastroparesis). Therefore, medicines and food may not absorb properly.  Rarely, a tumor of the pancreas can produce too much insulin. SYMPTOMS   Hunger.  Sweating (diaphoresis).  Change in body temperature.  Shakiness.  Headache.  Anxiety.  Lightheadedness.  Irritability.  Difficulty concentrating.  Dry mouth.  Tingling or numbness in the hands or feet.  Restless sleep or sleep disturbances.  Altered speech and  coordination.  Change in mental status.  Seizures or prolonged convulsions.  Combativeness.  Drowsiness (lethargic).  Weakness.  Increased heart rate or palpitations.  Confusion.  Pale, gray skin color.  Blurred or double vision.  Fainting. DIAGNOSIS  A physical exam and medical history will be performed. Your caregiver may make a diagnosis based on your symptoms. Blood tests and other lab tests may be performed to confirm a diagnosis. Once the diagnosis is made, your caregiver will see if your signs and symptoms go away once your blood glucose is raised.  TREATMENT  Usually, you can easily treat your hypoglycemia when you notice symptoms.  Check your blood glucose. If it is less than 70 mg/dl, take one of the following:   3-4 glucose tablets.    cup juice.    cup regular soda.   1 cup skim milk.   -1 tube of glucose gel.   5-6 hard candies.   Avoid high-fat drinks or food that may delay a rise in blood glucose levels.  Do not take more than the recommended amount of sugary foods, drinks, gel, or tablets. Doing so will cause your blood glucose to go too high.   Wait 10-15 minutes and recheck your blood glucose. If it is still less than 70 mg/dl or below your target range, repeat treatment.   Eat a snack if it is more than 1 hour until your next meal.  There may be a time when your blood glucose may go so low that you are unable to treat yourself at home when you start to notice symptoms. You may need someone to help you. You may even faint or be unable to  swallow. If you cannot treat yourself, someone will need to bring you to the hospital.  [place]  If you have diabetes, follow your diabetes management plan by:  Taking your medicines as directed.  Following your exercise plan.  Following your meal plan. Do not skip meals. Eat on time.  Testing your blood glucose regularly. Check your blood glucose before and after exercise. If you  exercise longer or different than usual, be sure to check blood glucose more frequently.  Wearing your medical alert jewelry that says you have diabetes.  Identify the cause of your hypoglycemia. Then, develop ways to prevent the recurrence of hypoglycemia.  Do not take a hot bath or shower right after an insulin shot.  Always carry treatment with you. Glucose tablets are the easiest to carry.  If you are going to drink alcohol, drink it only with meals.  Tell friends or family members ways to keep you safe during a seizure. This may include removing hard or sharp objects from the area or turning you on your side.  Maintain a healthy weight. SEEK MEDICAL CARE IF:   You are having problems keeping your blood glucose in your target range.  You are having frequent episodes of hypoglycemia.  You feel you might be having side effects from your medicines.  You are not sure why your blood glucose is dropping so low.  You notice a change in vision or a new problem with your vision. SEEK IMMEDIATE MEDICAL CARE IF:   Confusion develops.  A change in mental status occurs.  The inability to swallow develops.  Fainting occurs.   This information is not intended to replace advice given to you by your health care provider. Make sure you discuss any questions you have with your health care provider.   Document Released: 07/26/2005 Document Revised: 07/31/2013 Document Reviewed: 04/01/2015 Elsevier Interactive Patient Education [employer].

## 2015-11-05 NOTE — Assessment & Plan Note (Signed)
Asymptomatic. On aspirin and statin. Consider updating stress test in next few visits.

## 2015-11-05 NOTE — Assessment & Plan Note (Signed)
S: poorly controlled. On metformin 1g BID and invokana 300mg  through invokamet BID Exercise and diet- diet ok, exercise lacking  Lab Results  Component Value Date   HGBA1C 9.5* 10/29/2015   HGBA1C 8.0* 06/03/2015   HGBA1C 7.3* 01/27/2015   A/P: increase exercise, continue current medicine, add amaryl 2mg 

## 2015-11-06 ENCOUNTER — Other Ambulatory Visit: Payer: Self-pay

## 2015-11-06 MED ORDER — NITROGLYCERIN 0.4 MG/SPRAY TL SOLN: Status: DC

## 2015-11-12 ENCOUNTER — Telehealth: Payer: Self-pay | Admitting: Family Medicine

## 2015-11-12 NOTE — Telephone Encounter (Signed)
Prior authorization for  One Touch Verio Test Strips has been denied by [facility] stating:  Restricted access medications may be covered when an alternative medication on the member's formulary has been tried and did not work.  [employer] and Contour Next are the preferred test strips.  A non-preferred test strip is also approved when the member has a visual impairment, uses an insulin pump that does not work with a preferred strip, or cannot use a Engineer, structural meter due to a physical disability.

## 2015-11-13 NOTE — Telephone Encounter (Signed)
See below

## 2015-11-13 NOTE — Telephone Encounter (Signed)
Do we have one of these meters we can give patient? If not- can speak with pharmacy and order strips and meter for this type. Please inform patient of change

## 2015-11-14 MED ORDER — GLUCOSE BLOOD VI STRP: ORAL_STRIP | Status: DC

## 2015-11-14 NOTE — Telephone Encounter (Signed)
Found a Bayer Contour next meter and sent in strips for this meter. Pt notified via mychart.

## 2015-12-05 ENCOUNTER — Other Ambulatory Visit: Payer: Self-pay | Admitting: Family Medicine

## 2015-12-19 ENCOUNTER — Ambulatory Visit (INDEPENDENT_AMBULATORY_CARE_PROVIDER_SITE_OTHER): Payer: BLUE CROSS/BLUE SHIELD | Admitting: Family Medicine

## 2015-12-19 ENCOUNTER — Encounter: Payer: Self-pay | Admitting: Family Medicine

## 2015-12-19 VITALS — BP 140/70 | HR 75 | Temp 99.2°F | Wt 210.0 lb

## 2015-12-19 DIAGNOSIS — J0100 Acute maxillary sinusitis, unspecified: Secondary | ICD-10-CM

## 2015-12-19 DIAGNOSIS — R21 Rash and other nonspecific skin eruption: Secondary | ICD-10-CM | POA: Diagnosis not present

## 2015-12-19 DIAGNOSIS — B356 Tinea cruris: Secondary | ICD-10-CM | POA: Diagnosis not present

## 2015-12-19 MED ORDER — KETOCONAZOLE 2 % EX CREA: 1.0000 "application " | TOPICAL_CREAM | Freq: Every day | CUTANEOUS | Status: DC

## 2015-12-19 MED ORDER — AZITHROMYCIN 250 MG PO TABS: ORAL_TABLET | ORAL | Status: DC

## 2015-12-19 NOTE — Patient Instructions (Addendum)
Upper Respiratory infection vs sinusitis History and exam today are suggestive of viral illness.  We discussed that a serious infection or illness is unlikely. We also discussed reasons why current illness does not meet criteria for bacterial illness and therefore no antibiotics indicated at present.  Also educated on signs that bacterial infection may have developed- worsening sinus pressure after initial improvement or symptoms past 10 days or if you had a fever- you can take azithromycin  Symptomatic treatment with: tylenol and throat lozenges  Finally, we reviewed reasons to return to care including if symptoms worsen or persist or new concerns arise.  Meds ordered this encounter  Medications  . ketoconazole (NIZORAL) 2 % cream    Sig: Apply 1 application topically daily. For rash in groin- tinea cruris    Dispense:  60 g    Refill:  0  . azithromycin (ZITHROMAX) 250 MG tablet    Sig: Take 2 tabs on day 1, then 1 tab daily until finished    Dispense:  6 tablet    Refill:  0     Jock Itch  Treat with ketoconazole cream once a day- treat for at least a week after rash is gone- may take a few weeks for this to clear Jock itch (tinea cruris) is a fungal infection of the skin in the groin area. It is sometimes called ringworm, even though it is not caused by worms. It is caused by a fungus, which is a type of germ that thrives in dark, damp places. Jock itch causes a rash and itching in the groin and upper thigh area. It usually goes away in 2-3 weeks with treatment. CAUSES The fungus that causes jock itch may be spread by:  Touching a fungus infection elsewhere on your body--such as athlete's foot--and then touching your groin area.  Sharing towels or clothing with an infected person. RISK FACTORS Jock itch is most common in men and adolescent boys. This condition is more likely to develop from:  Being in hot, humid climates.  Wearing tight-fitting clothing or wet bathing suits for  long periods of time.  Participating in sports.  Being overweight.  Having diabetes. SYMPTOMS Symptoms of jock itch may include:  A red, pink, or brown rash in the groin area. The rash may spread to the thighs, anus, and buttocks.  Dry and scaly skin on or around the rash.  Itchiness. DIAGNOSIS Most often, a health care provider can make the diagnosis by looking at your rash. Sometimes, a scraping of the infected skin will be taken. This sample may be tested by looking at it under a microscope or by trying to grow the fungus from the sample (culture).  TREATMENT Treatment for this condition may include:  Antifungal medicine to kill the fungus. This may be in various forms:  Skin cream or ointment.  Medicine taken by mouth.  Skin cream or ointment to reduce the itching.  Compresses or medicated powders to dry the infected skin. HOME CARE INSTRUCTIONS  Take medicines only as directed by your health care provider. Apply skin creams or ointments exactly as directed.  Wear loose-fitting clothing.  Men should wear cotton boxer shorts.  Women should wear cotton underwear.  Change your underwear every day to keep your groin dry.  Avoid hot baths.  Dry your groin area well after bathing.  Use a separate towel to dry your groin area. This will help to prevent a spreading of the infection to other areas of your body.  Do not scratch the affected area.  Do not share towels with other people. SEEK MEDICAL CARE IF:  Your rash does not improve or it gets worse after 2 weeks of treatment.  Your rash is spreading.  Your rash returns after treatment is finished.  You have a fever.  You have redness, swelling, or pain in the area around your rash.  You have fluid, blood, or pus coming from your rash.  Your have your rash for more than 4 weeks.   This information is not intended to replace advice given to you by your health care provider. Make sure you discuss any  questions you have with your health care provider.   Document Released: 07/16/2002 Document Revised: 08/16/2014 Document Reviewed: 05/07/2014 Elsevier Interactive Patient Education [employer].

## 2015-12-19 NOTE — Progress Notes (Signed)
PCP: [name], MD  Subjective:  Larry Garner is a 60 y.o. year old very pleasant male patient who presents with Upper Respiratory infection symptoms including nasal congestion with mainly clear drainage, sore throat, cough, ear fullness. Has some sinus pressure mild as well. Feels fatigued.  Physically and mentally exhausted from work with recent merger.  -started: Tuesday and symptoms are worsening -previous treatments: rest, hydration.  -sick contacts/travel/risks: denies flu exposure. Several sick contacts at work -history of allergies as well  ROS-denies fever, SOB, NVD, tooth pain  Pertinent Past Medical History-  Patient Active Problem List   Diagnosis Date Noted  . Low TSH level 11/05/2015    Priority: High  . Poorly controlled diabetes mellitus ([place]) 03/31/2010    Priority: High  . CAD (coronary artery disease) s/p CABG 04/06/2007    Priority: High  . Insomnia 05/27/2014    Priority: Medium  . Asthma, mild intermittent 06/19/2008    Priority: Medium  . ERECTILE DYSFUNCTION, SECONDARY TO MEDICATION 05/17/2007    Priority: Medium  . Hyperlipidemia 04/06/2007    Priority: Medium  . Anxiety state 04/06/2007    Priority: Medium  . Essential hypertension 04/06/2007    Priority: Medium  . Fever blister 04/09/2015    Priority: Low  . Former smoker 11/04/2014    Priority: Low  . Plantar wart of left foot 11/27/2010    Priority: Low  . PSA, INCREASED 09/15/2009    Priority: Low  . Diverticulitis of colon 05/08/2009    Priority: Low  . ANKLE PAIN, CHRONIC 06/05/2007    Priority: Low  . Allergic rhinitis 05/17/2007    Priority: Low   Medications- reviewed  Current Outpatient Prescriptions  Medication Sig Dispense Refill  . amLODipine (NORVASC) 5 MG tablet TAKE 1 TABLET BY MOUTH EVERY DAY 30 tablet 5  . aspirin 325 MG tablet Take 325 mg by mouth daily.      Marland Kitchen azelastine (ASTELIN) 137 MCG/SPRAY nasal spray Place 2 sprays into both nostrils 2 (two) times daily.  Use in each nostril as directed 30 mL 0  . Azelastine-Fluticasone (DYMISTA) 137-50 MCG/ACT SUSP 1 spray each nostril bid 1 Bottle 5  . bisoprolol-hydrochlorothiazide (ZIAC) 10-6.25 MG tablet TAKE 1 TABLET BY MOUTH EVERY DAY 30 tablet 6  . budesonide-formoterol (SYMBICORT) 160-4.5 MCG/ACT inhaler Inhale 2 puffs into the lungs 2 (two) times daily. 1 Inhaler 3  . fluticasone (FLONASE) 50 MCG/ACT nasal spray Place 2 sprays into both nostrils daily. 16 g 0  . folic acid (FOLVITE) Q000111Q MCG tablet Take 1,600 mcg by mouth daily.      Marland Kitchen glimepiride (AMARYL) 2 MG tablet Take 1 tablet (2 mg total) by mouth daily before breakfast. 30 tablet 3  . glucose blood (BAYER CONTOUR NEXT TEST) test strip Use to test blood sugars daily. Dx: E11.9 100 each 12  . glucose blood (ONETOUCH VERIO) test strip Use to test blood sugars daily. Dx: E11.9 100 each 12  . Inositol Niacinate (NIACIN FLUSH FREE) 500 MG CAPS Take 1 capsule (500 mg total) by mouth daily. 180 each 6  . INVOKAMET [id number] MG TABS TAKE 1 TABLET BY MOUTH TWICE A DAY 60 tablet 5  . losartan (COZAAR) 100 MG tablet TAKE 1 TABLET (100 MG TOTAL) BY MOUTH DAILY. 90 tablet 3  . Multiple Vitamin (MULTIVITAMIN) tablet Take 1 tablet by mouth daily.      Marland Kitchen omega-3 acid ethyl esters (LOVAZA) 1 g capsule Take 1 capsule (1 g total) by mouth 3 (three) times  daily. 90 capsule 3  . Pyridoxine HCl (VITAMIN B-6) 500 MG tablet Take 500 mg by mouth daily.      . rosuvastatin (CRESTOR) 20 MG tablet TAKE 1 TABLET (20 MG TOTAL) BY MOUTH DAILY. 90 tablet 1  . zaleplon (SONATA) 10 MG capsule TAKE ONE CAPSULE BY MOUTH AT BEDTIME AS NEEDED FOR SLEEEP 30 capsule 5  . ALPRAZolam (XANAX) 0.5 MG tablet Take 1 tablet (0.5 mg total) by mouth every 6 (six) hours as needed. (Patient not taking: Reported on 11/05/2015) 30 tablet 5  . ketoconazole (NIZORAL) 2 % cream Apply 1 application topically daily. For rash in groin- tinea cruris 60 g 0  . nitroGLYCERIN (NITROLINGUAL) 0.4 MG/SPRAY spray USE  AS DIRECTED (Patient not taking: Reported on 12/19/2015) 30 g 2  . nitroGLYCERIN (NITROSTAT) 0.4 MG SL tablet Place 1 tablet (0.4 mg total) under the tongue every 5 (five) minutes as needed for chest pain. (Patient not taking: Reported on 11/05/2015) 30 tablet 4   No current facility-administered medications for this visit.    Objective: BP 140/70 mmHg  Pulse 75  Temp(Src) 99.2 F (37.3 C)  Wt 210 lb (95.255 kg) Gen: NAD, resting comfortably HEENT: Turbinates erythematous, TM normal, pharynx mildly erythematous with no tonsilar exudate or edema, no sinus tenderness CV: RRR no murmurs rubs or gallops Lungs: CTAB no crackles, wheeze, rhonchi Abdomen: soft/nontender/nondistended/normal bowel sounds. No rebound or guarding.  Ext: no edema Skin: warm, dry, no rash Neuro: grossly normal, moves all extremities  Assessment/Plan:  Upper Respiratory infection vs. sinusitis History and exam today are suggestive of viral illness.  We discussed that a serious infection or illness is unlikely. We also discussed reasons why current illness does not meet criteria for bacterial illness and therefore no antibiotics indicated at present.  Also educated on signs that bacterial infection may have developed- worsening sinus pressure after initial improvement or symptoms past 10 days or if you had a fever- you can take azithromycin. This was given to patient primarily due to him being out of town and inability to have close follow up in office- he had a sinus infection bacterial a year ago in similar setting so concern this may develop. Used augmentin and later levaquin for that so wanted to use different antibiotic.   Symptomatic treatment with: tylenol and throat lozenges  Rash S:underwear were rubbing and he changed to knitted type and groin seemed to get hot mainly on right. Mild odor to it. Desitin helps some. Tried gold bond as well but no jock itch cream. Itching has improved but bumps persist.    ROS-not ill appearing, no fever/chills. No new medications. Not immunocompromised. No mucus membrane involvement.  O: almost circular rash in right groin with central clearing A/P: Tinea cruris- ketoconazole cream once a day for a week after rash fully resolved  Finally, we reviewed reasons to return to care including if symptoms worsen or persist or new concerns arise.  Meds ordered this encounter  Medications  . ketoconazole (NIZORAL) 2 % cream    Sig: Apply 1 application topically daily. For rash in groin- tinea cruris    Dispense:  60 g    Refill:  0  . azithromycin (ZITHROMAX) 250 MG tablet    Sig: Take 2 tabs on day 1, then 1 tab daily until finished    Dispense:  6 tablet    Refill:  0  2 new acute issue- both requiring medical management

## 2016-01-06 ENCOUNTER — Other Ambulatory Visit: Payer: Self-pay | Admitting: Family Medicine

## 2016-01-09 ENCOUNTER — Other Ambulatory Visit: Payer: Self-pay | Admitting: Family Medicine

## 2016-01-15 ENCOUNTER — Other Ambulatory Visit: Payer: Self-pay | Admitting: Family Medicine

## 2016-01-15 ENCOUNTER — Encounter: Payer: Self-pay | Admitting: Family Medicine

## 2016-02-02 ENCOUNTER — Other Ambulatory Visit (INDEPENDENT_AMBULATORY_CARE_PROVIDER_SITE_OTHER): Payer: BLUE CROSS/BLUE SHIELD

## 2016-02-02 DIAGNOSIS — E1165 Type 2 diabetes mellitus with hyperglycemia: Secondary | ICD-10-CM

## 2016-02-02 DIAGNOSIS — R946 Abnormal results of thyroid function studies: Secondary | ICD-10-CM

## 2016-02-02 DIAGNOSIS — R7989 Other specified abnormal findings of blood chemistry: Secondary | ICD-10-CM

## 2016-02-02 LAB — T3, FREE: T3 FREE: 3.2 pg/mL (ref 2.3–4.2)

## 2016-02-02 LAB — TSH: TSH: 0.37 u[IU]/mL (ref 0.35–4.50)

## 2016-02-02 LAB — T4, FREE: FREE T4: 0.96 ng/dL (ref 0.60–1.60)

## 2016-02-02 LAB — HEMOGLOBIN A1C: HEMOGLOBIN A1C: 6.7 % — AB (ref 4.6–6.5)

## 2016-02-03 ENCOUNTER — Encounter: Payer: Self-pay | Admitting: Family Medicine

## 2016-02-09 ENCOUNTER — Encounter: Payer: Self-pay | Admitting: Family Medicine

## 2016-02-09 ENCOUNTER — Ambulatory Visit (INDEPENDENT_AMBULATORY_CARE_PROVIDER_SITE_OTHER): Payer: BLUE CROSS/BLUE SHIELD | Admitting: Family Medicine

## 2016-02-09 DIAGNOSIS — E119 Type 2 diabetes mellitus without complications: Secondary | ICD-10-CM | POA: Diagnosis not present

## 2016-02-09 DIAGNOSIS — E785 Hyperlipidemia, unspecified: Secondary | ICD-10-CM

## 2016-02-09 DIAGNOSIS — I2510 Atherosclerotic heart disease of native coronary artery without angina pectoris: Secondary | ICD-10-CM

## 2016-02-09 DIAGNOSIS — R946 Abnormal results of thyroid function studies: Secondary | ICD-10-CM

## 2016-02-09 DIAGNOSIS — R7989 Other specified abnormal findings of blood chemistry: Secondary | ICD-10-CM

## 2016-02-09 DIAGNOSIS — I10 Essential (primary) hypertension: Secondary | ICD-10-CM | POA: Diagnosis not present

## 2016-02-09 NOTE — Progress Notes (Signed)
Pre visit review using our clinic review tool, if applicable. No additional management support is needed unless otherwise documented below in the visit note. 

## 2016-02-09 NOTE — Assessment & Plan Note (Signed)
S: prior changes to TSH (reduced) completely resolved on labs before this visit. In addition t3, t4 normal- will monitor future TSH only A/P: no further management other than yearly monitoring

## 2016-02-09 NOTE — Assessment & Plan Note (Signed)
S: well controlled. On metformin 1g BID, invokana 300mg  (combo pill for both), amaryl 2mg  CBGs- started checking blood sugar and noted over 200 and really motivated Exercise and diet- started walking 2.5 miles a day (able to have some sweets with this- sweet tea sparingly- may have to work extra)  Lab Results  Component Value Date   HGBA1C 6.7* 02/02/2016   HGBA1C 9.5* 10/29/2015   HGBA1C 8.0* 06/03/2015   A/P: continue current medication and lifestyle changes- much improved

## 2016-02-09 NOTE — Progress Notes (Signed)
Subjective:  Larry Garner is a 60 y.o. year old very pleasant male patient who presents for/with See problem oriented charting ROS- no hypoglycemia, No CP, SOB, HA..see any ROS included in HPI as well.   Past Medical History-  Patient Active Problem List   Diagnosis Date Noted  . Well controlled type 2 diabetes mellitus ([place]) 03/31/2010    Priority: High  . CAD (coronary artery disease) s/p CABG 04/06/2007    Priority: High  . Insomnia 05/27/2014    Priority: Medium  . Asthma, mild intermittent 06/19/2008    Priority: Medium  . ERECTILE DYSFUNCTION, SECONDARY TO MEDICATION 05/17/2007    Priority: Medium  . Hyperlipidemia 04/06/2007    Priority: Medium  . Anxiety state 04/06/2007    Priority: Medium  . Essential hypertension 04/06/2007    Priority: Medium  . Fever blister 04/09/2015    Priority: Low  . Former smoker 11/04/2014    Priority: Low  . Plantar wart of left foot 11/27/2010    Priority: Low  . PSA, INCREASED 09/15/2009    Priority: Low  . Diverticulitis of colon 05/08/2009    Priority: Low  . ANKLE PAIN, CHRONIC 06/05/2007    Priority: Low  . Allergic rhinitis 05/17/2007    Priority: Low    Medications- reviewed and updated Current Outpatient Prescriptions  Medication Sig Dispense Refill  . ALPRAZolam (XANAX) 0.5 MG tablet Take 1 tablet (0.5 mg total) by mouth every 6 (six) hours as needed. 30 tablet 5  . amLODipine (NORVASC) 5 MG tablet TAKE 1 TABLET BY MOUTH EVERY DAY 30 tablet 5  . aspirin 325 MG tablet Take 325 mg by mouth daily.      . Azelastine-Fluticasone (DYMISTA) 137-50 MCG/ACT SUSP 1 spray each nostril bid 1 Bottle 5  . bisoprolol-hydrochlorothiazide (ZIAC) 10-6.25 MG tablet TAKE 1 TABLET BY MOUTH EVERY DAY 30 tablet 11  . budesonide-formoterol (SYMBICORT) 160-4.5 MCG/ACT inhaler Inhale 2 puffs into the lungs 2 (two) times daily. 1 Inhaler 3  . fluticasone (FLONASE) 50 MCG/ACT nasal spray Place 2 sprays into both nostrils daily. 16 g 0  . folic  acid (FOLVITE) Q000111Q MCG tablet Take 1,600 mcg by mouth daily.      Marland Kitchen glimepiride (AMARYL) 2 MG tablet Take 1 tablet (2 mg total) by mouth daily before breakfast. 30 tablet 3  . glucose blood (BAYER CONTOUR NEXT TEST) test strip Use to test blood sugars daily. Dx: E11.9 100 each 12  . Inositol Niacinate (NIACIN FLUSH FREE) 500 MG CAPS Take 1 capsule (500 mg total) by mouth daily. 180 each 6  . INVOKAMET [id number] MG TABS TAKE 1 TABLET BY MOUTH TWICE A DAY 60 tablet 5  . ketoconazole (NIZORAL) 2 % cream Apply 1 application topically daily. For rash in groin- tinea cruris 60 g 0  . losartan (COZAAR) 100 MG tablet TAKE 1 TABLET (100 MG TOTAL) BY MOUTH DAILY. 90 tablet 3  . Multiple Vitamin (MULTIVITAMIN) tablet Take 1 tablet by mouth daily.      . nitroGLYCERIN (NITROLINGUAL) 0.4 MG/SPRAY spray USE AS DIRECTED 30 g 2  . nitroGLYCERIN (NITROSTAT) 0.4 MG SL tablet Place 1 tablet (0.4 mg total) under the tongue every 5 (five) minutes as needed for chest pain. 30 tablet 4  . omega-3 acid ethyl esters (LOVAZA) 1 g capsule Take 1 capsule (1 g total) by mouth 3 (three) times daily. 90 capsule 3  . Pyridoxine HCl (VITAMIN B-6) 500 MG tablet Take 500 mg by mouth daily.      Marland Kitchen  rosuvastatin (CRESTOR) 20 MG tablet TAKE 1 TABLET BY MOUTH EVERY DAY 90 tablet 3  . zaleplon (SONATA) 10 MG capsule TAKE ONE CAPSULE BY MOUTH AT BEDTIME AS NEEDED FOR SLEEEP 30 capsule 5   No current facility-administered medications for this visit.    Objective: BP 126/70 mmHg  Pulse 63  Temp(Src) 97.6 F (36.4 C) (Oral)  Ht 5\' 11"  (1.803 m)  Wt 212 lb (96.163 kg)  BMI 29.58 kg/m2  SpO2 96% Gen: NAD, resting comfortably CV: RRR no murmurs rubs or gallops Lungs: CTAB no crackles, wheeze, rhonchi Abdomen: soft/nontender/nondistended/normal bowel sounds.  Ext: no edema Skin: warm, dry Neuro: grossly normal, moves all extremities  EKG: Sinus bradycardia with rate of 58, 1st degree AV block, left axis, qrs prolonged, qt interval  normal, right bundle branch block. q wave in avf. Prior tracing 09/15/2009 appears largely unchanged though difficult to view 2011 EKG due to poorly scanned in document.   Assessment/Plan:  Low TSH level S: prior changes to TSH (reduced) completely resolved on labs before this visit. In addition t3, t4 normal- will monitor future TSH only A/P: no further management other than yearly monitoring  Well controlled type 2 diabetes mellitus ([place]) S: well controlled. On metformin 1g BID, invokana 300mg  (combo pill for both), amaryl 2mg  CBGs- started checking blood sugar and noted over 200 and really motivated Exercise and diet- started walking 2.5 miles a day (able to have some sweets with this- sweet tea sparingly- may have to work extra)  Lab Results  Component Value Date   HGBA1C 6.7* 02/02/2016   HGBA1C 9.5* 10/29/2015   HGBA1C 8.0* 06/03/2015   A/P: continue current medication and lifestyle changes- much improved  CAD (coronary artery disease) s/p CABG S: denies CP or SOB with exercise. History of triple bypass. Compliant with aspirin and statin. Not seeing cardiology at present A/P: updated EKG. Asymptomatic- consider stress test in future   Hyperlipidemia S: well controlled on crestor and lovaza. No myalgias. LDL 4 months ago was 47. Goal <70. Triglycerides under 200.   A/P: no changes, doing well  Hypertension S: controlled on amlodipine 5mg , losartan 100mg , bisoproll-hctz 10-6.25mg  BP Readings from Last 3 Encounters:  02/09/16 126/70  12/19/15 140/70  11/05/15 122/70  A/P:Continue current meds:  Well controlled  Return in about 4 months (around 06/11/2016) for follow up- or sooner if needed. get an a1c at visit.  Orders Placed This Encounter  Procedures  . EKG 12-Lead    Order Specific Question:  Where should this test be performed    Answer:  Other   Return precautions advised.  [name], MD

## 2016-02-09 NOTE — Assessment & Plan Note (Signed)
S: denies CP or SOB with exercise. History of triple bypass. Compliant with aspirin and statin. Not seeing cardiology at present A/P: updated EKG. Asymptomatic- consider stress test in future

## 2016-02-09 NOTE — Patient Instructions (Addendum)
EKG looks largely stable- consider updating stress test at physical  No changes in medication

## 2016-02-12 ENCOUNTER — Other Ambulatory Visit: Payer: Self-pay | Admitting: *Deleted

## 2016-02-12 ENCOUNTER — Encounter: Payer: Self-pay | Admitting: Family Medicine

## 2016-02-12 MED ORDER — AMLODIPINE BESYLATE 5 MG PO TABS: 5.0000 mg | ORAL_TABLET | Freq: Every day | ORAL | Status: DC

## 2016-02-12 NOTE — Telephone Encounter (Signed)
[facility] in A[facility]odipine [facility]mg  [facility]

## 2016-02-13 ENCOUNTER — Other Ambulatory Visit: Payer: Self-pay | Admitting: *Deleted

## 2016-02-13 MED ORDER — GLIMEPIRIDE 2 MG PO TABS
2.0000 mg | ORAL_TABLET | Freq: Every day | ORAL | Status: DC
Start: 2016-02-13 — End: 2017-03-04

## 2016-02-13 MED ORDER — BISOPROLOL-HYDROCHLOROTHIAZIDE 10-6.25 MG PO TABS: 1.0000 | ORAL_TABLET | Freq: Every day | ORAL | Status: DC

## 2016-02-13 MED ORDER — ROSUVASTATIN CALCIUM 20 MG PO TABS: 20.0000 mg | ORAL_TABLET | Freq: Every day | ORAL | Status: DC

## 2016-02-13 MED ORDER — LOSARTAN POTASSIUM 100 MG PO TABS: ORAL_TABLET | ORAL | Status: DC

## 2016-02-22 ENCOUNTER — Encounter: Payer: Self-pay | Admitting: Family Medicine

## 2016-02-23 ENCOUNTER — Encounter: Payer: Self-pay | Admitting: Family Medicine

## 2016-02-23 ENCOUNTER — Ambulatory Visit (INDEPENDENT_AMBULATORY_CARE_PROVIDER_SITE_OTHER): Payer: BLUE CROSS/BLUE SHIELD | Admitting: Internal Medicine

## 2016-02-23 ENCOUNTER — Encounter: Payer: Self-pay | Admitting: Internal Medicine

## 2016-02-23 VITALS — BP 144/98 | HR 65 | Ht 71.0 in | Wt 215.5 lb

## 2016-02-23 DIAGNOSIS — IMO0001 Reserved for inherently not codable concepts without codable children: Secondary | ICD-10-CM

## 2016-02-23 DIAGNOSIS — J329 Chronic sinusitis, unspecified: Secondary | ICD-10-CM

## 2016-02-23 DIAGNOSIS — R0982 Postnasal drip: Secondary | ICD-10-CM

## 2016-02-23 DIAGNOSIS — R030 Elevated blood-pressure reading, without diagnosis of hypertension: Secondary | ICD-10-CM | POA: Diagnosis not present

## 2016-02-23 MED ORDER — AMOXICILLIN-POT CLAVULANATE 875-125 MG PO TABS: 1.0000 | ORAL_TABLET | Freq: Two times a day (BID) | ORAL | Status: DC

## 2016-02-23 NOTE — Progress Notes (Signed)
Pre visit review using our clinic review tool, if applicable. No additional management support is needed unless otherwise documented below in the visit note. 

## 2016-02-23 NOTE — Patient Instructions (Signed)
Try sinus rinses   And continue nose spray   antibiotics may have more harm than help at this time If  persistent or progressive can consider adding the antibiotic  As  Discussed .

## 2016-02-23 NOTE — Progress Notes (Signed)
Chief Complaint  Patient presents with  . Sinus Problem    started over the weekend, pt has had a nagging headache, drainage down throat. pt has a dentist appt tomorrow and wants to get the drainage checked out    HPI: Larry Garner 60 y.o.  PCPNA  Pt with dm  chornically stuffy nose and  Now  Last night got worse but has been going on day s.  Stuffiness and bad tast e now present no fever.    [facility] .  On dymysta   Some headache  Concern about bacterial infection  Cough no cp sfever   Bp is up for him   given  Scrip for z pack for uri persistent    In MA17 ROS: See pertinent positives and negatives per HPI. Dm    Past Medical History  Diagnosis Date  . Anxiety   . HLD (hyperlipidemia)   . HTN (hypertension)   . History of heart attack   . Allergic rhinitis   . Diabetes ([place])   . Asthma   . Tubular adenoma of colon 09/2008  . Diverticulitis     Family History  Problem Relation Age of Onset  . Hypertension Mother   . Alzheimer's disease Mother   . Hyperlipidemia Father   . Hypertension Father   . Colon polyps Father   . Colon cancer Paternal Aunt   . Pancreatic cancer Neg Hx   . Stomach cancer Neg Hx     Social History   Social History  . Marital Status: Married    Spouse Name: N/A  . Number of Children: N/A  . Years of Education: N/A   Occupational History  . Banker    Social History Main Topics  . Smoking status: Former Smoker -- 0.50 packs/day for 4 years    Types: Cigarettes    Quit date: 09/29/1977  . Smokeless tobacco: Never Used     Comment: quit on 1980  . Alcohol Use: 0.6 oz/week    1 Standard drinks or equivalent per week  . Drug Use: No  . Sexual Activity: Yes   Other Topics Concern  . None   Social History Narrative   Married  In 1981 with 2 kids (son and daughter). No grandkids.       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 ALPRAZolam (XANAX) 0.5 MG tablet Take 1 tablet (0.5 mg total) by mouth every 6 (six) hours as needed. 30 tablet 5  . amLODipine (NORVASC) 5 MG tablet Take 1 tablet (5 mg total) by mouth daily. 90 tablet 1  . aspirin 325 MG tablet Take 325 mg by mouth daily.      . Azelastine-Fluticasone (DYMISTA) 137-50 MCG/ACT SUSP 1 spray each nostril bid 1 Bottle 5  . bisoprolol-hydrochlorothiazide (ZIAC) 10-6.25 MG tablet Take 1 tablet by mouth daily. 90 tablet 3  . budesonide-formoterol (SYMBICORT) 160-4.5 MCG/ACT inhaler Inhale 2 puffs into the lungs 2 (two) times daily. 1 Inhaler 3  . fluticasone (FLONASE) 50 MCG/ACT nasal spray Place 2 sprays into both nostrils daily. 16 g 0  . folic acid (FOLVITE) Q000111Q MCG tablet Take 1,600 mcg by mouth daily.      Marland Kitchen glimepiride (AMARYL) 2 MG tablet Take 1 tablet (2 mg total) by mouth daily before breakfast. 90 tablet 3  .  glucose blood (BAYER CONTOUR NEXT TEST) test strip Use to test blood sugars daily. Dx: E11.9 100 each 12  . Inositol Niacinate (NIACIN FLUSH FREE) 500 MG CAPS Take 1 capsule (500 mg total) by mouth daily. 180 each 6  . INVOKAMET [phone] MG TABS TAKE 1 TABLET BY MOUTH TWICE A DAY 60 tablet 5  . ketoconazole (NIZORAL) 2 % cream Apply 1 application topically daily. For rash in groin- tinea cruris 60 g 0  . losartan (COZAAR) 100 MG tablet TAKE 1 TABLET (100 MG TOTAL) BY MOUTH DAILY. 90 tablet 3  . Multiple Vitamin (MULTIVITAMIN) tablet Take 1 tablet by mouth daily.      . nitroGLYCERIN (NITROLINGUAL) 0.4 MG/SPRAY spray USE AS DIRECTED 30 g 2  . nitroGLYCERIN (NITROSTAT) 0.4 MG SL tablet Place 1 tablet (0.4 mg total) under the tongue every 5 (five) minutes as needed for chest pain. 30 tablet 4  . omega-3 acid ethyl esters (LOVAZA) 1 g capsule Take 1 capsule (1 g total) by mouth 3 (three) times daily. 90 capsule 3  . Pyridoxine HCl (VITAMIN B-6) 500 MG tablet Take 500 mg by mouth daily.      . rosuvastatin  (CRESTOR) 20 MG tablet Take 1 tablet (20 mg total) by mouth daily. 90 tablet 3  . zaleplon (SONATA) 10 MG capsule TAKE ONE CAPSULE BY MOUTH AT BEDTIME AS NEEDED FOR SLEEEP 30 capsule 5   No facility-administered medications prior to visit.     EXAM:  BP 144/98 mmHg  Pulse 65  Ht 5\' 11"  (1.803 m)  Wt 215 lb 8 oz (97.75 kg)  BMI 30.07 kg/m2  SpO2 97%  Body mass index is 30.07 kg/(m^2).  GENERAL: vitals reviewed and listed above, alert, oriented, appears well hydrated and in no acute distress HEENT: atraumatic, conjunctiva  clear, no obvious abnormalities on inspection of external nose stuffy  Congested  No face pain  and ears tm clear OP : no lesion edema or exudate drainage tracts noted  NECK: no obvious masses on inspection palpation  LUNGS: clear to auscultation bilaterally, no wheezes, rales or rhonchi, CV: HRRR, no clubbing cyanosis or  peripheral edema nl cap refill  MS: moves all extremities without noticeable focal  abnormality PSYCH: pleasant and cooperative, no obvious depression or anxiety  ASSESSMENT AND PLAN:  Discussed the following assessment and plan:  Post-nasal drainage  Chronic recurrent sinusitis ?  Elevated BP - plan recheck himself  when not in office has been ok Appears to be acute on chronic changes. Advised against antibiotic at this time however can add it is progressive. Nasal sinus rinses are appropriate discussed risk benefit of antibiotic including risk of C. difficile etc. Patient is aware. He will check blood pressure at home or when he is not in the office and not sick. Is elevated for him today. -Patient advised to return or notify health care team  if symptoms worsen ,persist or new concerns arise.  Patient Instructions   Try sinus rinses   And continue nose spray   antibiotics may have more harm than help at this time If  persistent or progressive can consider adding the antibiotic  As  Discussed .       Standley Brooking. Nile Dorning M.D.

## 2016-02-24 ENCOUNTER — Other Ambulatory Visit: Payer: Self-pay | Admitting: Family Medicine

## 2016-02-24 NOTE — Telephone Encounter (Signed)
I do not have a copy of their request. Only thing I have is a copy of invokamet requested today. Larry Garner can refill the alprazolam for you. Remember we discussed using 24 or less per month and if using more than that- we need to consider a daily anti anxiety medicine. She can give you #30 with 5 refills but this should last you closer to 7 months.

## 2016-02-25 ENCOUNTER — Other Ambulatory Visit: Payer: Self-pay | Admitting: Family Medicine

## 2016-02-26 ENCOUNTER — Telehealth: Payer: Self-pay | Admitting: Family Medicine

## 2016-02-26 MED ORDER — ALPRAZOLAM 0.5 MG PO TABS: 0.5000 mg | ORAL_TABLET | Freq: Four times a day (QID) | ORAL | Status: DC | PRN

## 2016-02-26 NOTE — Telephone Encounter (Signed)
Rx called in to pharmacy. 

## 2016-02-26 NOTE — Telephone Encounter (Signed)
Error- refill pending in another note.

## 2016-03-01 ENCOUNTER — Encounter: Payer: Self-pay | Admitting: Family Medicine

## 2016-03-02 ENCOUNTER — Other Ambulatory Visit: Payer: Self-pay

## 2016-03-02 MED ORDER — CANAGLIFLOZIN-METFORMIN HCL 150-1000 MG PO TABS: 1.0000 | ORAL_TABLET | Freq: Two times a day (BID) | ORAL | 3 refills | Status: DC

## 2016-03-04 DIAGNOSIS — D1039 Benign neoplasm of other parts of mouth: Secondary | ICD-10-CM | POA: Diagnosis not present

## 2016-03-09 DIAGNOSIS — E119 Type 2 diabetes mellitus without complications: Secondary | ICD-10-CM | POA: Diagnosis not present

## 2016-03-09 LAB — HM DIABETES EYE EXAM

## 2016-03-18 ENCOUNTER — Encounter: Payer: Self-pay | Admitting: Family Medicine

## 2016-04-06 ENCOUNTER — Encounter: Payer: Self-pay | Admitting: Family Medicine

## 2016-04-06 ENCOUNTER — Other Ambulatory Visit: Payer: Self-pay | Admitting: Family Medicine

## 2016-04-26 ENCOUNTER — Encounter: Payer: Self-pay | Admitting: Family Medicine

## 2016-04-30 MED ORDER — OMEGA-3-ACID ETHYL ESTERS 1 G PO CAPS: 1.0000 g | ORAL_CAPSULE | Freq: Three times a day (TID) | ORAL | 3 refills | Status: DC

## 2016-05-11 ENCOUNTER — Ambulatory Visit (INDEPENDENT_AMBULATORY_CARE_PROVIDER_SITE_OTHER): Payer: BLUE CROSS/BLUE SHIELD

## 2016-05-11 DIAGNOSIS — Z23 Encounter for immunization: Secondary | ICD-10-CM

## 2016-05-20 ENCOUNTER — Other Ambulatory Visit: Payer: Self-pay | Admitting: Family Medicine

## 2016-06-11 ENCOUNTER — Ambulatory Visit: Payer: BLUE CROSS/BLUE SHIELD | Admitting: Family Medicine

## 2016-06-24 ENCOUNTER — Encounter: Payer: Self-pay | Admitting: Family Medicine

## 2016-06-24 ENCOUNTER — Ambulatory Visit (INDEPENDENT_AMBULATORY_CARE_PROVIDER_SITE_OTHER): Payer: BLUE CROSS/BLUE SHIELD | Admitting: Family Medicine

## 2016-06-24 VITALS — BP 118/72 | HR 61 | Temp 98.1°F | Wt 213.8 lb

## 2016-06-24 DIAGNOSIS — E785 Hyperlipidemia, unspecified: Secondary | ICD-10-CM | POA: Diagnosis not present

## 2016-06-24 DIAGNOSIS — E119 Type 2 diabetes mellitus without complications: Secondary | ICD-10-CM | POA: Diagnosis not present

## 2016-06-24 DIAGNOSIS — I10 Essential (primary) hypertension: Secondary | ICD-10-CM

## 2016-06-24 DIAGNOSIS — I2510 Atherosclerotic heart disease of native coronary artery without angina pectoris: Secondary | ICD-10-CM

## 2016-06-24 LAB — POCT GLYCOSYLATED HEMOGLOBIN (HGB A1C): HEMOGLOBIN A1C: 7.2

## 2016-06-24 NOTE — Progress Notes (Signed)
Pre visit review using our clinic review tool, if applicable. No additional management support is needed unless otherwise documented below in the visit note. 

## 2016-06-24 NOTE — Assessment & Plan Note (Signed)
S: asymptomatic. history triple bypass. Doing well on aspirin and statin. Not currently in with cardiology A/P: we discussed given at least 5 years if not longer since cardiology visit and stress test that I would advise him be reestablished with consideration of updating stress testing per cards recommendations.

## 2016-06-24 NOTE — Patient Instructions (Addendum)
You were right- a1c crept up to 7.2. Want to keep below 7- going to have to find a winter way to keep the weight down.   We will call you within a week about your referral to cardiology. If you do not hear within 2 weeks, give [place] a call.

## 2016-06-24 NOTE — Progress Notes (Signed)
Subjective:  Larry Garner is a 60 y.o. year old very pleasant male patient who presents for/with See problem oriented charting ROS- No chest pain or shortness of breath. No headache or blurry vision.  Past Medical History-  Patient Active Problem List   Diagnosis Date Noted  . Well controlled type 2 diabetes mellitus ([place]) 03/31/2010    Priority: High  . CAD (coronary artery disease) s/p CABG 04/06/2007    Priority: High  . Insomnia 05/27/2014    Priority: Medium  . Asthma, mild intermittent 06/19/2008    Priority: Medium  . ERECTILE DYSFUNCTION, SECONDARY TO MEDICATION 05/17/2007    Priority: Medium  . Hyperlipidemia 04/06/2007    Priority: Medium  . Anxiety state 04/06/2007    Priority: Medium  . Essential hypertension 04/06/2007    Priority: Medium  . Fever blister 04/09/2015    Priority: Low  . Former smoker 11/04/2014    Priority: Low  . Plantar wart of left foot 11/27/2010    Priority: Low  . PSA, INCREASED 09/15/2009    Priority: Low  . Diverticulitis of colon 05/08/2009    Priority: Low  . ANKLE PAIN, CHRONIC 06/05/2007    Priority: Low  . Allergic rhinitis 05/17/2007    Priority: Low    Medications- reviewed and updated Current Outpatient Prescriptions  Medication Sig Dispense Refill  . ALPRAZolam (XANAX) 0.5 MG tablet Take 1 tablet (0.5 mg total) by mouth every 6 (six) hours as needed. 30 tablet 5  . amLODipine (NORVASC) 5 MG tablet Take 1 tablet (5 mg total) by mouth daily. 90 tablet 1  . aspirin 325 MG tablet Take 325 mg by mouth daily.      . Azelastine-Fluticasone (DYMISTA) 137-50 MCG/ACT SUSP 1 spray each nostril bid 1 Bottle 5  . bisoprolol-hydrochlorothiazide (ZIAC) 10-6.25 MG tablet Take 1 tablet by mouth daily. 90 tablet 3  . Canagliflozin-Metformin HCl (INVOKAMET) [phone] MG TABS Take 1 tablet by mouth 2 (two) times daily. 90 tablet 3  . fluticasone (FLONASE) 50 MCG/ACT nasal spray Place 2 sprays into both nostrils daily. 16 g 0  . folic acid  (FOLVITE) Q000111Q MCG tablet Take 1,600 mcg by mouth daily.      Marland Kitchen glimepiride (AMARYL) 2 MG tablet Take 1 tablet (2 mg total) by mouth daily before breakfast. 90 tablet 3  . glucose blood (BAYER CONTOUR NEXT TEST) test strip Use to test blood sugars daily. Dx: E11.9 100 each 12  . Inositol Niacinate (NIACIN FLUSH FREE) 500 MG CAPS Take 1 capsule (500 mg total) by mouth daily. 180 each 6  . ketoconazole (NIZORAL) 2 % cream Apply 1 application topically daily. For rash in groin- tinea cruris 60 g 0  . losartan (COZAAR) 100 MG tablet TAKE 1 TABLET (100 MG TOTAL) BY MOUTH DAILY. 90 tablet 3  . Multiple Vitamin (MULTIVITAMIN) tablet Take 1 tablet by mouth daily.      . nitroGLYCERIN (NITROLINGUAL) 0.4 MG/SPRAY spray USE AS DIRECTED 30 g 2  . nitroGLYCERIN (NITROSTAT) 0.4 MG SL tablet Place 1 tablet (0.4 mg total) under the tongue every 5 (five) minutes as needed for chest pain. 30 tablet 4  . omega-3 acid ethyl esters (LOVAZA) 1 g capsule Take 1 capsule (1 g total) by mouth 3 (three) times daily. 90 capsule 3  . Pyridoxine HCl (VITAMIN B-6) 500 MG tablet Take 500 mg by mouth daily.      . rosuvastatin (CRESTOR) 20 MG tablet Take 1 tablet (20 mg total) by mouth daily. [place]  tablet 3  . SYMBICORT 160-4.5 MCG/ACT inhaler INHALE 2 PUFFS INTO THE LUNGS TWICE A DAY 10.2 Inhaler 1  . zaleplon (SONATA) 10 MG capsule TAKE ONE CAPSULE BY MOUTH AT BEDTIME AS NEEDED FOR SLEEP 30 capsule 5   No current facility-administered medications for this visit.     Objective: BP 118/72 (BP Location: Right Arm, Patient Position: Sitting, Cuff Size: Large)   Pulse 61   Temp 98.1 F (36.7 C) (Oral)   Wt 213 lb 12.8 oz (97 kg)   SpO2 96%   BMI 29.82 kg/m  Gen: NAD, resting comfortably, appears stated age CV: RRR no murmurs rubs or gallops Lungs: CTAB no crackles, wheeze, rhonchi Abdomen: soft/nontender/nondistended/normal bowel sounds. overweight  Ext: no edema  Assessment/Plan:  Essential hypertension S: controlled  well on amlodipine 5mg , losartan 100mg , bisoprolol-hctz 10-6.25mg  BP Readings from Last 3 Encounters:  06/24/16 118/72  02/23/16 (!) 144/98  02/09/16 126/70  A/P:Continue current medications- improved control from last visit  Well controlled type 2 diabetes mellitus ([place]) S: mild poorly controlled. On metformin 1g BID, invokana 300mg , amaryl 2mg  CBGs- sugars running about 130 fasting, up some with exercise down Exercise and diet- weight down 3 lbs. Exercise has been down so concerned a1c will be up- was doing 2.5 miles once a day when warm Lab Results  Component Value Date   HGBA1C 7.2 06/24/2016   HGBA1C 6.7 (H) 02/02/2016   HGBA1C 9.5 (H) 10/29/2015   A/P: slight increase in a1c- have advised he restart his exercise pgroam and find a way to make this work in winter for him as seems to be critical piece for his DM control. Pleased with weight loss though  CAD (coronary artery disease) s/p CABG S: asymptomatic. history triple bypass. Doing well on aspirin and statin. Not currently in with cardiology A/P: we discussed given at least 5 years if not longer since cardiology visit and stress test that I would advise him be reestablished with consideration of updating stress testing per cards recommendations.   Hyperlipidemia S: well controlled on lovaza and crestor with last ldl 47. No myalgias.   A/P: continue current medicines  Return in about 5 months (around 11/22/2016) for physical.  Orders Placed This Encounter  Procedures  . Ambulatory referral to Cardiology    Referral Priority:   Routine    Referral Type:   Consultation    Referral Reason:   Specialty Services Required    Requested Specialty:   Cardiology    Number of Visits Requested:   1  . POCT glycosylated hemoglobin (Hb A1C)   Return precautions advised.  [name], MD

## 2016-06-24 NOTE — Assessment & Plan Note (Signed)
S: mild poorly controlled. On metformin 1g BID, invokana 300mg , amaryl 2mg  CBGs- sugars running about 130 fasting, up some with exercise down Exercise and diet- weight down 3 lbs. Exercise has been down so concerned a1c will be up- was doing 2.5 miles once a day when warm Lab Results  Component Value Date   HGBA1C 7.2 06/24/2016   HGBA1C 6.7 (H) 02/02/2016   HGBA1C 9.5 (H) 10/29/2015   A/P: slight increase in a1c- have advised he restart his exercise pgroam and find a way to make this work in winter for him as seems to be critical piece for his DM control. Pleased with weight loss though

## 2016-06-24 NOTE — Assessment & Plan Note (Signed)
S: well controlled on lovaza and crestor with last ldl 47. No myalgias.   A/P: continue current medicines

## 2016-06-24 NOTE — Assessment & Plan Note (Signed)
S: controlled well on amlodipine 5mg , losartan 100mg , bisoprolol-hctz 10-6.25mg  BP Readings from Last 3 Encounters:  06/24/16 118/72  02/23/16 (!) 144/98  02/09/16 126/70  A/P:Continue current medications- improved control from last visit

## 2016-06-28 NOTE — Progress Notes (Signed)
Cardiology Office Note   Date:  06/29/2016   ID:  Larry Garner, Larry Garner Dec 24, 1955, MRN [id number]  PCP:  Larry Reddish, MD  Cardiologist:   Larry Rouge, MD   Chief Complaint  Patient presents with  . Establish Care      History of Present Illness: Larry HAGLE is a 61 yGarnero. male who presents for reestablishment care for CAD/CABG Had previous stent to LAD with restenosis Seen by Larry Garner   06/19/1999  CABG Larry Garner:  LIMA LAD,  SVG D1 SVG OM1  He is still banking. Knows Larry Garner well. No chest pain. Previously mostly had Dyspnea before bypass. Wife retired from [employer]. Two children daughter in [place] son just moved to [facility] area  He is somewhat sedentary but does walk   Past Medical History:  Diagnosis Date  . Allergic rhinitis   . Anxiety   . Asthma   . Diabetes ([place])   . Diverticulitis   . History of heart attack   . HLD (hyperlipidemia)   . HTN (hypertension)   . Tubular adenoma of colon 09/2008    Past Surgical History:  Procedure Laterality Date  . CORONARY ANGIOPLASTY    . CORONARY ARTERY BYPASS GRAFT  06/1999     Current Outpatient Prescriptions  Medication Sig Dispense Refill  . ALPRAZolam (XANAX) 0Garner5 MG tablet Take 1 tablet (0Garner5 mg total) by mouth every 6 (six) hours as needed. 30 tablet 5  . amLODipine (NORVASC) 5 MG tablet Take 1 tablet (5 mg total) by mouth daily. 90 tablet 1  . aspirin 325 MG tablet Take 325 mg by mouth daily.      . Azelastine-Fluticasone (DYMISTA) 137-50 MCG/ACT SUSP 1 spray each nostril bid 1 Bottle 5  . bisoprolol-hydrochlorothiazide (ZIAC) 10-6Garner25 MG tablet Take 1 tablet by mouth daily. 90 tablet 3  . Canagliflozin-Metformin HCl (INVOKAMET) [phone] MG TABS Take 1 tablet by mouth 2 (two) times daily. 90 tablet 3  . fluticasone (FLONASE) 50 MCG/ACT nasal spray Place 2 sprays into both nostrils daily. 16 g 0  . folic acid (FOLVITE) Q000111Q MCG tablet Take 1,600 mcg by mouth daily.      Marland Kitchen  glimepiride (AMARYL) 2 MG tablet Take 1 tablet (2 mg total) by mouth daily before breakfast. 90 tablet 3  . glucose blood (BAYER CONTOUR NEXT TEST) test strip Use to test blood sugars daily. Dx: E11Garner9 100 each 12  . Inositol Niacinate (NIACIN FLUSH FREE) 500 MG CAPS Take 1 capsule (500 mg total) by mouth daily. 180 each 6  . ketoconazole (NIZORAL) 2 % cream Apply 1 application topically daily. For rash in groin- tinea cruris 60 g 0  . losartan (COZAAR) 100 MG tablet TAKE 1 TABLET (100 MG TOTAL) BY MOUTH DAILY. 90 tablet 3  . Multiple Vitamin (MULTIVITAMIN) tablet Take 1 tablet by mouth daily.      . nitroGLYCERIN (NITROLINGUAL) 0Garner4 MG/SPRAY spray USE AS DIRECTED 30 g 2  . nitroGLYCERIN (NITROSTAT) 0Garner4 MG SL tablet Place 1 tablet (0Garner4 mg total) under the tongue every 5 (five) minutes as needed for chest pain. 30 tablet 4  . omega-3 acid ethyl esters (LOVAZA) 1 g capsule Take 1 capsule (1 g total) by mouth 3 (three) times daily. 90 capsule 3  . Pyridoxine HCl (VITAMIN B-6) 500 MG tablet Take 500 mg by mouth daily.      . rosuvastatin (CRESTOR) 20 MG tablet Take 1 tablet (20 mg total) by mouth daily. 90 tablet 3  .  SYMBICORT 160-4Garner5 MCG/ACT inhaler INHALE 2 PUFFS INTO THE LUNGS TWICE A DAY 10Garner2 Inhaler 1  . zaleplon (SONATA) 10 MG capsule TAKE ONE CAPSULE BY MOUTH AT BEDTIME AS NEEDED FOR SLEEP 30 capsule 5   No current facility-administered medications for this visit.     Allergies:   Morphine sulfate    Social History:  The patient  reports that he quit smoking about 38 years ago. His smoking use included Cigarettes. He has a 2Garner00 pack-year smoking history. He has never used smokeless tobacco. He reports that he drinks about 0Garner6 oz of alcohol per week . He reports that he does not use drugs.   Family History:  The patient's family history includes Alzheimer's disease in his mother; Colon cancer in his paternal aunt; Colon polyps in his father; Hyperlipidemia in his father; Hypertension in his  father and mother.    ROS:  Please see the history of present illness.   Otherwise, review of systems are positive for none.   All other systems are reviewed and negative.    PHYSICAL EXAM: VS:  BP 140/70   Pulse 71   Ht 5\' 11"  (1Garner803 m)   Wt 98 kg (216 lb 1Garner9 oz)   SpO2 98%   BMI 30Garner14 kg/m  , BMI Body mass index is 30Garner14 kg/m. Affect appropriate Healthy:  appears stated age 4: normal Neck supple with no adenopathy JVP normal no bruits no thyromegaly Lungs clear with no wheezing and good diaphragmatic motion Heart:  S1/S2 no murmur, no rub, gallop or click post sternotomy  PMI normal Abdomen: benighn, BS positve, no tenderness, no AAA no bruit.  No HSM or HJR Distal pulses intact with no bruits No edema Neuro non-focal Skin warm and dry LLE with venectomy scar  No muscular weakness    EKG:   02/09/16 SR RBBB LAD lateral T wave changes  06/29/16 SR rat 68 RBBB LAD LVH ? Larry IMI    Recent Labs: 10/29/2015: ALT 28; BUN 13; Creatinine, Ser 0Garner87; Hemoglobin 15Garner2; Platelets 236Garner0; Potassium 3Garner8; Sodium 139 02/02/2016: TSH 0Garner37    Lipid Panel    Component Value Date/Time   CHOL 107 10/29/2015 0805   TRIG 158Garner0 (H) 10/29/2015 0805   HDL 28Garner30 (L) 10/29/2015 0805   CHOLHDL 4 10/29/2015 0805   VLDL 31Garner6 10/29/2015 0805   LDLCALC 47 10/29/2015 0805      Wt Readings from Last 3 Encounters:  06/29/16 98 kg (216 lb 1Garner9 oz)  06/24/16 97 kg (213 lb 12Garner8 oz)  02/23/16 97Garner8 kg (215 lb 8 oz)      Other studies Reviewed: Additional studies/ records that were reviewed today include: CABG report Larry Garner .    ASSESSMENT AND PLAN:  1. CAD/CABG:  17 years ago with atypical angina. RCA not bypassed f/u exercise myovue to risk stratify  2. Chol: at goal continue statin labs with primary 3. HTN:  Well controlled.  Continue current medications and low sodium Dash type diet.   4. DM:  Discussed low carb diet.  Target hemoglobin A1c  is 6Garner5 or less.  Continue current medications.    Current medicines are reviewed at length with the patient today.  The patient does not have concerns regarding medicines.  The following changes have been made:  no change  Labs/ tests ordered today include: Ex Myovue   Orders Placed This Encounter  Procedures  . EKG 12-Lead     Disposition:  FU with me in a year if myovue normal      Signed, Larry Rouge, MD  06/29/2016 10:03 AM    [facility] [address], [place], [place]  [address] Phone: [phone]; Fax: [phone]

## 2016-06-29 ENCOUNTER — Encounter: Payer: Self-pay | Admitting: Cardiovascular Disease

## 2016-06-29 ENCOUNTER — Ambulatory Visit (INDEPENDENT_AMBULATORY_CARE_PROVIDER_SITE_OTHER): Payer: BLUE CROSS/BLUE SHIELD | Admitting: Cardiovascular Disease

## 2016-06-29 VITALS — BP 140/70 | HR 71 | Ht 71.0 in | Wt 216.1 lb

## 2016-06-29 DIAGNOSIS — Z951 Presence of aortocoronary bypass graft: Secondary | ICD-10-CM | POA: Diagnosis not present

## 2016-06-29 DIAGNOSIS — I2510 Atherosclerotic heart disease of native coronary artery without angina pectoris: Secondary | ICD-10-CM | POA: Diagnosis not present

## 2016-06-29 DIAGNOSIS — Z7689 Persons encountering health services in other specified circumstances: Secondary | ICD-10-CM

## 2016-06-29 NOTE — Patient Instructions (Signed)
Medication Instructions:  Your physician recommends that you continue on your current medications as directed. Please refer to the Current Medication list given to you today.   Labwork: None  Testing/Procedures: Dr. Johnsie Cancel recommends you have a NUCLEAR STRESS TEST.  Follow-Up: Your physician wants you to follow-up in: 1 year with Dr. Johnsie Cancel. You will receive a reminder letter in the mail two months in advance. If you don't receive a letter, please call our office to schedule the follow-up appointment.   Any Other Special Instructions Will Be Listed Below (If Applicable).     If you need a refill on your cardiac medications before your next appointment, please call your pharmacy.

## 2016-07-12 ENCOUNTER — Telehealth (HOSPITAL_COMMUNITY): Payer: Self-pay | Admitting: *Deleted

## 2016-07-12 NOTE — Telephone Encounter (Signed)
Left message on voicemail per DPR in reference to upcoming appointment scheduled on 07/14/16 at 0715 with detailed instructions given per Myocardial Perfusion Study Information Sheet for the test. LM to arrive 15 minutes early, and that it is imperative to arrive on time for appointment to keep from having the test rescheduled. If you need to cancel or reschedule your appointment, please call the office within 24 hours of your appointment. Failure to do so may result in a cancellation of your appointment, and a $50 no show fee. Phone number given for call back for any questions.

## 2016-07-14 ENCOUNTER — Other Ambulatory Visit: Payer: Self-pay | Admitting: Cardiovascular Disease

## 2016-07-14 ENCOUNTER — Ambulatory Visit (HOSPITAL_COMMUNITY): Payer: BLUE CROSS/BLUE SHIELD | Attending: Cardiology

## 2016-07-14 ENCOUNTER — Telehealth: Payer: Self-pay

## 2016-07-14 DIAGNOSIS — Z951 Presence of aortocoronary bypass graft: Secondary | ICD-10-CM

## 2016-07-14 DIAGNOSIS — I2510 Atherosclerotic heart disease of native coronary artery without angina pectoris: Secondary | ICD-10-CM

## 2016-07-14 DIAGNOSIS — Z01812 Encounter for preprocedural laboratory examination: Secondary | ICD-10-CM

## 2016-07-14 LAB — MYOCARDIAL PERFUSION IMAGING
CHL CUP MPHR: 159 {beats}/min
CSEPEW: 10.1 METS
Exercise duration (min): 9 min
Exercise duration (sec): 30 s
LHR: 0.34
LVDIAVOL: 137 mL (ref 62–150)
LVSYSVOL: 70 mL
NUC STRESS TID: 1.08
Peak HR: 142 {beats}/min
Percent HR: 89 %
Rest HR: 58 {beats}/min
SDS: 7
SRS: 4
SSS: 9

## 2016-07-14 MED ORDER — TECHNETIUM TC 99M TETROFOSMIN IV KIT
32.6000 | PACK | Freq: Once | INTRAVENOUS | Status: AC | PRN
  Administered 2016-07-14: 32.6 via INTRAVENOUS
  Filled 2016-07-14: qty 33

## 2016-07-14 MED ORDER — TECHNETIUM TC 99M TETROFOSMIN IV KIT
10.1000 | PACK | Freq: Once | INTRAVENOUS | Status: AC | PRN
  Administered 2016-07-14: 10.1 via INTRAVENOUS
  Filled 2016-07-14: qty 11

## 2016-07-14 NOTE — Telephone Encounter (Signed)
-----   Message from Josue Hector, MD sent at 07/14/2016  3:24 PM EST ----- myovue is abnormal can set up for cath or have him see me to discuss

## 2016-07-14 NOTE — Telephone Encounter (Signed)
Called patient with stress test results. Patient scheduled for heart cath on Tuesday and Lab work on Monday. Read over instruction to patient over the phone, and sent copy through [place]. Will also leave instructions at the front desk for patient to pick up on Monday when he has lab work done. Patient verbalized understanding.

## 2016-07-19 ENCOUNTER — Other Ambulatory Visit: Payer: BLUE CROSS/BLUE SHIELD | Admitting: *Deleted

## 2016-07-19 DIAGNOSIS — Z01812 Encounter for preprocedural laboratory examination: Secondary | ICD-10-CM | POA: Diagnosis not present

## 2016-07-19 LAB — BASIC METABOLIC PANEL
BUN: 10 mg/dL (ref 7–25)
CHLORIDE: 103 mmol/L (ref 98–110)
CO2: 25 mmol/L (ref 20–31)
CREATININE: 0.88 mg/dL (ref 0.70–1.25)
Calcium: 9.2 mg/dL (ref 8.6–10.3)
Glucose, Bld: 263 mg/dL — ABNORMAL HIGH (ref 65–99)
POTASSIUM: 3.6 mmol/L (ref 3.5–5.3)
SODIUM: 139 mmol/L (ref 135–146)

## 2016-07-19 LAB — CBC WITH DIFFERENTIAL/PLATELET
BASOS ABS: 0 {cells}/uL (ref 0–200)
BASOS PCT: 0 %
EOS ABS: 415 {cells}/uL (ref 15–500)
Eosinophils Relative: 5 %
HEMATOCRIT: 46 % (ref 38.5–50.0)
HEMOGLOBIN: 15.9 g/dL (ref 13.2–17.1)
LYMPHS ABS: 2905 {cells}/uL (ref 850–3900)
Lymphocytes Relative: 35 %
MCH: 30.8 pg (ref 27.0–33.0)
MCHC: 34.6 g/dL (ref 32.0–36.0)
MCV: 89.1 fL (ref 80.0–100.0)
MONO ABS: 581 {cells}/uL (ref 200–950)
MPV: 10.3 fL (ref 7.5–12.5)
Monocytes Relative: 7 %
Neutro Abs: 4399 cells/uL (ref 1500–7800)
Neutrophils Relative %: 53 %
Platelets: 252 10*3/uL (ref 140–400)
RBC: 5.16 MIL/uL (ref 4.20–5.80)
RDW: 13.7 % (ref 11.0–15.0)
WBC: 8.3 10*3/uL (ref 3.8–10.8)

## 2016-07-19 LAB — PROTIME-INR
INR: 1
Prothrombin Time: 10.9 s (ref 9.0–11.5)

## 2016-07-20 ENCOUNTER — Encounter (HOSPITAL_COMMUNITY)
Admission: RE | Disposition: A | Discharge status: Home / Self Care | Payer: Self-pay | Source: Ambulatory Visit | Attending: Interventional Cardiology

## 2016-07-20 ENCOUNTER — Ambulatory Visit (HOSPITAL_COMMUNITY)
Admission: RE | Admit: 2016-07-20 | Discharge: 2016-07-20 | Disposition: A | Discharge status: Home / Self Care | Payer: BLUE CROSS/BLUE SHIELD | Source: Ambulatory Visit | Attending: Interventional Cardiology | Admitting: Interventional Cardiology

## 2016-07-20 ENCOUNTER — Other Ambulatory Visit: Payer: BLUE CROSS/BLUE SHIELD

## 2016-07-20 DIAGNOSIS — Z87891 Personal history of nicotine dependence: Secondary | ICD-10-CM

## 2016-07-20 DIAGNOSIS — I2582 Chronic total occlusion of coronary artery: Secondary | ICD-10-CM | POA: Diagnosis not present

## 2016-07-20 DIAGNOSIS — I25811 Atherosclerosis of native coronary artery of transplanted heart without angina pectoris: Secondary | ICD-10-CM | POA: Diagnosis not present

## 2016-07-20 DIAGNOSIS — E1165 Type 2 diabetes mellitus with hyperglycemia: Secondary | ICD-10-CM | POA: Diagnosis not present

## 2016-07-20 DIAGNOSIS — E785 Hyperlipidemia, unspecified: Secondary | ICD-10-CM | POA: Diagnosis not present

## 2016-07-20 DIAGNOSIS — I10 Essential (primary) hypertension: Secondary | ICD-10-CM | POA: Diagnosis not present

## 2016-07-20 DIAGNOSIS — R9439 Abnormal result of other cardiovascular function study: Secondary | ICD-10-CM

## 2016-07-20 DIAGNOSIS — T82855A Stenosis of coronary artery stent, initial encounter: Secondary | ICD-10-CM | POA: Insufficient documentation

## 2016-07-20 DIAGNOSIS — Y712 Prosthetic and other implants, materials and accessory cardiovascular devices associated with adverse incidents: Secondary | ICD-10-CM | POA: Diagnosis not present

## 2016-07-20 DIAGNOSIS — I2510 Atherosclerotic heart disease of native coronary artery without angina pectoris: Secondary | ICD-10-CM

## 2016-07-20 HISTORY — PX: CARDIAC CATHETERIZATION: SHX172

## 2016-07-20 LAB — GLUCOSE, CAPILLARY: Glucose-Capillary: 148 mg/dL — ABNORMAL HIGH (ref 65–99)

## 2016-07-20 SURGERY — LEFT HEART CATH AND CORS/GRAFTS ANGIOGRAPHY
Anesthesia: LOCAL

## 2016-07-20 MED ORDER — SODIUM CHLORIDE 0.9% FLUSH: 3.0000 mL | INTRAVENOUS | Status: DC | PRN

## 2016-07-20 MED ORDER — HEPARIN (PORCINE) IN NACL 2-0.9 UNIT/ML-% IJ SOLN
INTRAMUSCULAR | Status: DC | PRN
  Administered 2016-07-20: 1000 mL

## 2016-07-20 MED ORDER — HEPARIN SODIUM (PORCINE) 1000 UNIT/ML IJ SOLN
INTRAMUSCULAR | Status: DC | PRN
  Administered 2016-07-20: 5000 [IU] via INTRAVENOUS

## 2016-07-20 MED ORDER — FENTANYL CITRATE (PF) 100 MCG/2ML IJ SOLN
INTRAMUSCULAR | Status: AC
  Filled 2016-07-20: qty 2

## 2016-07-20 MED ORDER — FENTANYL CITRATE (PF) 100 MCG/2ML IJ SOLN
INTRAMUSCULAR | Status: DC | PRN
  Administered 2016-07-20: 50 ug via INTRAVENOUS

## 2016-07-20 MED ORDER — ASPIRIN 81 MG PO CHEW
81.0000 mg | CHEWABLE_TABLET | ORAL | Status: DC
Start: 2016-07-20 — End: 2016-07-20

## 2016-07-20 MED ORDER — SODIUM CHLORIDE 0.9 % WEIGHT BASED INFUSION: 1.0000 mL/kg/h | INTRAVENOUS | Status: DC

## 2016-07-20 MED ORDER — VERAPAMIL HCL 2.5 MG/ML IV SOLN
INTRAVENOUS | Status: DC | PRN
  Administered 2016-07-20: 10 mL via INTRA_ARTERIAL

## 2016-07-20 MED ORDER — LIDOCAINE HCL (PF) 1 % IJ SOLN
INTRAMUSCULAR | Status: DC | PRN
Start: 2016-07-20 — End: 2016-07-20
  Administered 2016-07-20: 2 mL via INTRADERMAL

## 2016-07-20 MED ORDER — SODIUM CHLORIDE 0.9% FLUSH: 3.0000 mL | Freq: Two times a day (BID) | INTRAVENOUS | Status: DC

## 2016-07-20 MED ORDER — SODIUM CHLORIDE 0.9 % IV SOLN: 250.0000 mL | INTRAVENOUS | Status: DC | PRN

## 2016-07-20 MED ORDER — ONDANSETRON HCL 4 MG/2ML IJ SOLN: 4.0000 mg | Freq: Four times a day (QID) | INTRAMUSCULAR | Status: DC | PRN

## 2016-07-20 MED ORDER — HEPARIN (PORCINE) IN NACL 2-0.9 UNIT/ML-% IJ SOLN
INTRAMUSCULAR | Status: AC
  Filled 2016-07-20: qty 1000

## 2016-07-20 MED ORDER — MIDAZOLAM HCL 2 MG/2ML IJ SOLN
INTRAMUSCULAR | Status: DC | PRN
  Administered 2016-07-20 (×2): 1 mg via INTRAVENOUS

## 2016-07-20 MED ORDER — LIDOCAINE HCL (PF) 1 % IJ SOLN
INTRAMUSCULAR | Status: AC
  Filled 2016-07-20: qty 30

## 2016-07-20 MED ORDER — IOPAMIDOL (ISOVUE-370) INJECTION 76%
INTRAVENOUS | Status: DC | PRN
  Administered 2016-07-20: 110 mL via INTRA_ARTERIAL

## 2016-07-20 MED ORDER — ACETAMINOPHEN 325 MG PO TABS: 650.0000 mg | ORAL_TABLET | ORAL | Status: DC | PRN

## 2016-07-20 MED ORDER — MIDAZOLAM HCL 2 MG/2ML IJ SOLN
INTRAMUSCULAR | Status: AC
  Filled 2016-07-20: qty 2

## 2016-07-20 MED ORDER — HEPARIN SODIUM (PORCINE) 1000 UNIT/ML IJ SOLN
INTRAMUSCULAR | Status: AC
  Filled 2016-07-20: qty 1

## 2016-07-20 MED ORDER — VERAPAMIL HCL 2.5 MG/ML IV SOLN
INTRAVENOUS | Status: AC
  Filled 2016-07-20: qty 2

## 2016-07-20 MED ORDER — IOPAMIDOL (ISOVUE-370) INJECTION 76%
INTRAVENOUS | Status: AC
  Filled 2016-07-20: qty 125

## 2016-07-20 MED ORDER — SODIUM CHLORIDE 0.9 % WEIGHT BASED INFUSION
3.0000 mL/kg/h | INTRAVENOUS | Status: DC
  Administered 2016-07-20: 3 mL/kg/h via INTRAVENOUS

## 2016-07-20 MED ORDER — SODIUM CHLORIDE 0.9 % IV SOLN: INTRAVENOUS | Status: AC

## 2016-07-20 SURGICAL SUPPLY — 10 items
CATH INFINITI 5FR JL4 (CATHETERS) ×1 IMPLANT
CATH INFINITI JR4 5F (CATHETERS) ×1 IMPLANT
DEVICE RAD COMP TR BAND LRG (VASCULAR PRODUCTS) ×1 IMPLANT
GLIDESHEATH SLEND A-KIT 6F 22G (SHEATH) ×1 IMPLANT
GUIDEWIRE INQWIRE 1.5J.035X260 (WIRE) IMPLANT
INQWIRE 1.5J .035X260CM (WIRE) ×4
KIT HEART LEFT (KITS) ×2 IMPLANT
PACK CARDIAC CATHETERIZATION (CUSTOM PROCEDURE TRAY) ×2 IMPLANT
TRANSDUCER W/STOPCOCK (MISCELLANEOUS) ×2 IMPLANT
TUBING CIL FLEX 10 FLL-RA (TUBING) ×2 IMPLANT

## 2016-07-20 NOTE — CV Procedure (Signed)
   Occluded native right coronary.  Patent saphenous vein graft to the diagonal and obtuse marginal. Patent LIMA to the LAD.  Total occlusion of the native LAD. Total occlusion of the first obtuse marginal and second diagonal

## 2016-07-20 NOTE — H&P (Signed)
The patient is undergoing elective cardiac catheterization because of intermediate risk myocardial perfusion study, bypass surgery 17 years ago, and heavy risk factor burden including hyperlipidemia, hypertension, and diabetes mellitus under poor control.

## 2016-07-20 NOTE — Discharge Instructions (Signed)
NO INVOKAMET FOR 2 DAYS  Radial Site Care Introduction Refer to this sheet in the next few weeks. These instructions provide you with information about caring for yourself after your procedure. Your health care provider may also give you more specific instructions. Your treatment has been planned according to current medical practices, but problems sometimes occur. Call your health care provider if you have any problems or questions after your procedure. What can I expect after the procedure? After your procedure, it is typical to have the following:  Bruising at the radial site that usually fades within 1-2 weeks.  Blood collecting in the tissue (hematoma) that may be painful to the touch. It should usually decrease in size and tenderness within 1-2 weeks. Follow these instructions at home:  Take medicines only as directed by your health care provider.  You may shower 24-48 hours after the procedure or as directed by your health care provider. Remove the bandage (dressing) and gently wash the site with plain soap and water. Pat the area dry with a clean towel. Do not rub the site, because this may cause bleeding.  Do not take baths, swim, or use a hot tub until your health care provider approves.  Check your insertion site every day for redness, swelling, or drainage.  Do not apply powder or lotion to the site.  Do not flex or bend the affected arm for 24 hours or as directed by your health care provider.  Do not push or pull heavy objects with the affected arm for 24 hours or as directed by your health care provider.  Do not lift over 10 lb (4.5 kg) for 5 days after your procedure or as directed by your health care provider.  Ask your health care provider when it is okay to:  Return to work or school.  Resume usual physical activities or sports.  Resume sexual activity.  Do not drive home if you are discharged the same day as the procedure. Have someone else drive you.  You  may drive 24 hours after the procedure unless otherwise instructed by your health care provider.  Do not operate machinery or power tools for 24 hours after the procedure.  If your procedure was done as an outpatient procedure, which means that you went home the same day as your procedure, a responsible adult should be with you for the first 24 hours after you arrive home.  Keep all follow-up visits as directed by your health care provider. This is important. Contact a health care provider if:  You have a fever.  You have chills.  You have increased bleeding from the radial site. Hold pressure on the site. Get help right away if:  You have unusual pain at the radial site.  You have redness, warmth, or swelling at the radial site.  You have drainage (other than a small amount of blood on the dressing) from the radial site.  The radial site is bleeding, and the bleeding does not stop after 30 minutes of holding steady pressure on the site.  Your arm or hand becomes pale, cool, tingly, or numb. This information is not intended to replace advice given to you by your health care provider. Make sure you discuss any questions you have with your health care provider. Document Released: 08/28/2010 Document Revised: 01/01/2016 Document Reviewed: 02/11/2014  2017 Elsevier

## 2016-07-21 ENCOUNTER — Encounter (HOSPITAL_COMMUNITY): Payer: Self-pay | Admitting: Interventional Cardiology

## 2016-07-24 ENCOUNTER — Other Ambulatory Visit: Payer: Self-pay | Admitting: Family Medicine

## 2016-08-10 ENCOUNTER — Encounter: Payer: Self-pay | Admitting: Family Medicine

## 2016-08-12 NOTE — Telephone Encounter (Signed)
Please advise 

## 2016-08-16 ENCOUNTER — Encounter: Payer: Self-pay | Admitting: Family Medicine

## 2016-08-16 ENCOUNTER — Other Ambulatory Visit: Payer: Self-pay | Admitting: Family Medicine

## 2016-08-17 ENCOUNTER — Other Ambulatory Visit: Payer: Self-pay

## 2016-08-17 MED ORDER — FAMCICLOVIR 500 MG PO TABS: ORAL_TABLET | ORAL | 1 refills | Status: DC

## 2016-09-16 ENCOUNTER — Other Ambulatory Visit: Payer: Self-pay | Admitting: Family Medicine

## 2016-09-20 ENCOUNTER — Encounter: Payer: Self-pay | Admitting: Family Medicine

## 2016-09-20 ENCOUNTER — Ambulatory Visit (INDEPENDENT_AMBULATORY_CARE_PROVIDER_SITE_OTHER): Payer: BLUE CROSS/BLUE SHIELD | Admitting: Family Medicine

## 2016-09-20 VITALS — BP 132/70 | HR 59 | Temp 98.6°F | Ht 71.0 in | Wt 218.8 lb

## 2016-09-20 DIAGNOSIS — J4540 Moderate persistent asthma, uncomplicated: Secondary | ICD-10-CM | POA: Diagnosis not present

## 2016-09-20 DIAGNOSIS — R062 Wheezing: Secondary | ICD-10-CM | POA: Diagnosis not present

## 2016-09-20 MED ORDER — PREDNISONE 20 MG PO TABS: ORAL_TABLET | ORAL | 0 refills | Status: DC

## 2016-09-20 MED ORDER — ALBUTEROL SULFATE (2.5 MG/3ML) 0.083% IN NEBU
2.5000 mg | INHALATION_SOLUTION | Freq: Once | RESPIRATORY_TRACT | Status: AC
  Administered 2016-09-20: 2.5 mg via RESPIRATORY_TRACT

## 2016-09-20 MED ORDER — ALBUTEROL SULFATE HFA 108 (90 BASE) MCG/ACT IN AERS: 2.0000 | INHALATION_SPRAY | Freq: Four times a day (QID) | RESPIRATORY_TRACT | 5 refills | Status: DC | PRN

## 2016-09-20 NOTE — Patient Instructions (Addendum)
Albuterol- use at least every 6 hours for next 24-48 hours with waking hours. Then can change to as needed.   If not significantly improved within 72 hours or if symptoms worsen- please take the prednisone I sent in for you  For new symptoms such as fever or worsening shortness of breath please let us know  ___________________________________________________________  Starting October 1st 2018, I will be transferring to our new location: [facility] (corner of [address] from [employer]) [place], [place] Phone: [phone]  I would love to have you remain my patient at this new location as long as it remains convenient for you. I am excited about the opportunity to have x-ray and sports medicine in the new building but will really miss the awesome staff and physicians at [facility]. Continue to schedule appointments at [facility] and we will automatically transfer them to the [place] location starting October 1st.

## 2016-09-20 NOTE — Progress Notes (Signed)
Pre visit review using our clinic review tool, if applicable. No additional management support is needed unless otherwise documented below in the visit note. 

## 2016-09-20 NOTE — Progress Notes (Signed)
Subjective:  Larry Garner is a 61 y.o. year old very pleasant male patient who presents for/with See problem oriented charting ROS- no chest pain with exertion but does have some diffuse tightness particularly when wheezing. No fever or chills. Some cough and congestion   Past Medical History-  Patient Active Problem List   Diagnosis Date Noted  . Well controlled type 2 diabetes mellitus ([place]) 03/31/2010    Priority: High  . Coronary artery disease involving native coronary artery of native heart without angina pectoris 04/06/2007    Priority: High  . Insomnia 05/27/2014    Priority: Medium  . Asthma, moderate persistent 06/19/2008    Priority: Medium  . ERECTILE DYSFUNCTION, SECONDARY TO MEDICATION 05/17/2007    Priority: Medium  . Hyperlipidemia 04/06/2007    Priority: Medium  . Anxiety state 04/06/2007    Priority: Medium  . Essential hypertension 04/06/2007    Priority: Medium  . Fever blister 04/09/2015    Priority: Low  . Former smoker 11/04/2014    Priority: Low  . Plantar wart of left foot 11/27/2010    Priority: Low  . PSA, INCREASED 09/15/2009    Priority: Low  . Diverticulitis of colon 05/08/2009    Priority: Low  . ANKLE PAIN, CHRONIC 06/05/2007    Priority: Low  . Allergic rhinitis 05/17/2007    Priority: Low  . Abnormal nuclear stress test 07/20/2016    Medications- reviewed and updated Current Outpatient Prescriptions  Medication Sig Dispense Refill  . ALPRAZolam (XANAX) 0.5 MG tablet TAKE 1 TABLET BY MOUTH EVERY 6 HOURS AS NEEDED 30 tablet 3  . amLODipine (NORVASC) 5 MG tablet TAKE 1 TABLET DAILY 90 tablet 1  . aspirin 325 MG tablet Take 325 mg by mouth daily.      . Azelastine-Fluticasone (DYMISTA) 137-50 MCG/ACT SUSP 1 spray each nostril bid (Patient taking differently: Place 1 spray into both nostrils daily as needed (congestion). ) 1 Bottle 5  . bisoprolol-hydrochlorothiazide (ZIAC) 10-6.25 MG tablet Take 1 tablet by mouth daily. 90 tablet 3  .  Canagliflozin-Metformin HCl (INVOKAMET) [phone] MG TABS Take 1 tablet by mouth 2 (two) times daily. 90 tablet 3  . famciclovir (FAMVIR) 500 MG tablet TAKE 3 TABLETS BY MOUTH EVERY DAY AS NEEDED AT FIRST SIGN OF OUTBREAK OF FEVER BLISTER 15 tablet 1  . fluticasone (FLONASE) 50 MCG/ACT nasal spray Place 2 sprays into both nostrils daily. (Patient taking differently: Place 2 sprays into both nostrils daily as needed for allergies. ) 16 g 0  . folic acid (FOLVITE) [id number] MCG tablet Take 1,600 mcg by mouth daily.      Marland Kitchen glimepiride (AMARYL) 2 MG tablet Take 1 tablet (2 mg total) by mouth daily before breakfast. 90 tablet 3  . glucose blood (BAYER CONTOUR NEXT TEST) test strip Use to test blood sugars daily. Dx: E11.9 100 each 12  . Inositol Niacinate (NIACIN FLUSH FREE) 500 MG CAPS Take 1 capsule (500 mg total) by mouth daily. 180 each 6  . ketoconazole (NIZORAL) 2 % cream Apply 1 application topically daily. For rash in groin- tinea cruris (Patient taking differently: Apply 1 application topically daily as needed for irritation. For rash in groin- tinea cruris) 60 g 0  . losartan (COZAAR) 100 MG tablet TAKE 1 TABLET (100 MG TOTAL) BY MOUTH DAILY. 90 tablet 3  . Multiple Vitamin (MULTIVITAMIN) tablet Take 1 tablet by mouth daily.      . nitroGLYCERIN (NITROLINGUAL) 0.4 MG/SPRAY spray USE AS DIRECTED 30 g 2  .  nitroGLYCERIN (NITROSTAT) 0.4 MG SL tablet Place 1 tablet (0.4 mg total) under the tongue every 5 (five) minutes as needed for chest pain. 30 tablet 4  . omega-3 acid ethyl esters (LOVAZA) 1 g capsule Take 1 capsule (1 g total) by mouth 3 (three) times daily. 90 capsule 3  . oxymetazoline (AFRIN) 0.05 % nasal spray Place 1 spray into both nostrils 2 (two) times daily as needed for congestion.    . rosuvastatin (CRESTOR) 20 MG tablet Take 1 tablet (20 mg total) by mouth daily. 90 tablet 3  . SYMBICORT 160-4.5 MCG/ACT inhaler INHALE 2 PUFFS INTO THE LUNGS TWICE A DAY (Patient taking differently: INHALE 2  PUFFS INTO THE LUNGS TWICE A DAY AS NEEDED FOR SHORTNESS OF BREATH) 10.2 Inhaler 1  . zaleplon (SONATA) 10 MG capsule TAKE ONE CAPSULE BY MOUTH AT BEDTIME AS NEEDED FOR SLEEP 30 capsule 5  . albuterol (PROVENTIL HFA;VENTOLIN HFA) 108 (90 Base) MCG/ACT inhaler Inhale 2 puffs into the lungs every 6 (six) hours as needed for wheezing or shortness of breath. 1 Inhaler 5  . predniSONE (DELTASONE) 20 MG tablet Take 1 tablet by mouth daily for 5 days, then 1/2 tablet daily for 2 days 6 tablet 0   Current Facility-Administered Medications  Medication Dose Route Frequency Provider Last Rate Last Dose  . albuterol (PROVENTIL) (2.5 MG/3ML) 0.083% nebulizer solution 2.5 mg  2.5 mg Nebulization Once Marin Olp, MD        Objective: BP 132/70 (BP Location: Left Arm, Patient Position: Sitting, Cuff Size: Large)   Pulse (!) 59   Temp 98.6 F (37 C) (Oral)   Ht 5\' 11"  (1.803 m)   Wt 218 lb 12.8 oz (99.2 kg)   SpO2 96%   BMI 30.52 kg/m  Gen: NAD, resting comfortably TM normal, oropharynx largely normal, some erythema of nares CV: RRR no murmurs rubs or gallops Lungs: CTAB except for mild diffuse wheeze intermittent.  Ext: no edema Skin: warm, dry  Walks without difficulty after nebulizer, reported issues somewhat with walking in today  Assessment/Plan:  Wheezing - Plan: albuterol (PROVENTIL) (2.5 MG/3ML) 0.083% nebulizer solution 2.5 mg  Asthma, moderate persistent Exacerbation S: typically asthma has been really well controlled on symbicort. So much so that patient has not desired to pick up albuterol/rescue inhaler in some time. He states about a week ago he started to have some cough, congestion and then started to feel some shortness of breath as well as wheeze. Patient is also worried due his heart condition and feels some tightness across his chest- this does not worsen with exertion but he does get winded with walking.  A/P: Patient with significant improvement in symptoms with  albuterol nebulizer in office. He is reassured that this is likely all due to an asthma flare up. We discussed prednisone trial- since this seems to have been triggered by URI- possible will improve in next few days. He is going to have prednisone on hand in case symptoms worsen or fail to improve though and will certainly see [place] if fails on the prednisone.  Continue symbicort. Did send in albuterol and encouraged use q6 hours at least over next 24 hours.   Meds ordered this encounter  Medications  . albuterol (PROVENTIL HFA;VENTOLIN HFA) 108 (90 Base) MCG/ACT inhaler    Sig: Inhale 2 puffs into the lungs every 6 (six) hours as needed for wheezing or shortness of breath.    Dispense:  1 Inhaler    Refill:  5  .  albuterol (PROVENTIL) (2.5 MG/3ML) 0.083% nebulizer solution 2.5 mg  . predniSONE (DELTASONE) 20 MG tablet    Sig: Take 1 tablet by mouth daily for 5 days, then 1/2 tablet daily for 2 days    Dispense:  6 tablet    Refill:  0    Return precautions advised. He knows if chest pain or not improving with therapy to seek care.  [name], MD

## 2016-09-21 NOTE — Assessment & Plan Note (Addendum)
Exacerbation S: typically asthma has been really well controlled on symbicort. So much so that patient has not desired to pick up albuterol/rescue inhaler in some time. He states about a week ago he started to have some cough, congestion and then started to feel some shortness of breath as well as wheeze. Patient is also worried due his heart condition and feels some tightness across his chest- this does not worsen with exertion but he does get winded with walking.  A/P: Patient with significant improvement in symptoms with albuterol nebulizer in office. He is reassured that this is likely all due to an asthma flare up. We discussed prednisone trial- since this seems to have been triggered by URI- possible will improve in next few days. He is going to have prednisone on hand in case symptoms worsen or fail to improve though and will certainly see [place] if fails on the prednisone.  Continue symbicort. Did send in albuterol and encouraged use q6 hours at least over next 24 hours.

## 2016-11-02 ENCOUNTER — Other Ambulatory Visit: Payer: Self-pay | Admitting: Family Medicine

## 2016-11-02 ENCOUNTER — Encounter: Payer: Self-pay | Admitting: Family Medicine

## 2016-11-03 ENCOUNTER — Encounter: Payer: Self-pay | Admitting: Family Medicine

## 2016-11-04 ENCOUNTER — Encounter: Payer: Self-pay | Admitting: Family Medicine

## 2016-11-04 ENCOUNTER — Other Ambulatory Visit (INDEPENDENT_AMBULATORY_CARE_PROVIDER_SITE_OTHER): Payer: BLUE CROSS/BLUE SHIELD

## 2016-11-04 ENCOUNTER — Other Ambulatory Visit: Payer: BLUE CROSS/BLUE SHIELD

## 2016-11-04 DIAGNOSIS — R7989 Other specified abnormal findings of blood chemistry: Secondary | ICD-10-CM | POA: Diagnosis not present

## 2016-11-04 DIAGNOSIS — Z0000 Encounter for general adult medical examination without abnormal findings: Secondary | ICD-10-CM

## 2016-11-04 LAB — POCT UA - MICROALBUMIN
Albumin/Creatinine Ratio, Urine, POC: 10
CREATININE, POC: 50 mg/dL

## 2016-11-04 LAB — HEPATIC FUNCTION PANEL
ALBUMIN: 4.2 g/dL (ref 3.5–5.2)
ALK PHOS: 80 U/L (ref 39–117)
ALT: 24 U/L (ref 0–53)
AST: 18 U/L (ref 0–37)
Bilirubin, Direct: 0.1 mg/dL (ref 0.0–0.3)
Total Bilirubin: 0.6 mg/dL (ref 0.2–1.2)
Total Protein: 6.9 g/dL (ref 6.0–8.3)

## 2016-11-04 LAB — BASIC METABOLIC PANEL
BUN: 17 mg/dL (ref 6–23)
CHLORIDE: 104 meq/L (ref 96–112)
CO2: 24 mEq/L (ref 19–32)
CREATININE: 0.92 mg/dL (ref 0.40–1.50)
Calcium: 9.4 mg/dL (ref 8.4–10.5)
GFR: 88.78 mL/min (ref >60.00)
Glucose, Bld: 171 mg/dL — ABNORMAL HIGH (ref 70–99)
POTASSIUM: 3.7 meq/L (ref 3.5–5.1)
Sodium: 139 mEq/L (ref 135–145)

## 2016-11-04 LAB — POC URINALSYSI DIPSTICK (AUTOMATED)
Bilirubin, UA: NEGATIVE
Leukocytes, UA: NEGATIVE
Nitrite, UA: NEGATIVE
PROTEIN UA: NEGATIVE
RBC UA: NEGATIVE
Spec Grav, UA: 1.015 (ref 1.030–1.035)
UROBILINOGEN UA: 0.2 (ref ?–2.0)
pH, UA: 6 (ref 5.0–8.0)

## 2016-11-04 LAB — CBC WITH DIFFERENTIAL/PLATELET
BASOS PCT: 0.5 % (ref 0.0–3.0)
Basophils Absolute: 0.1 10*3/uL (ref 0.0–0.1)
EOS ABS: 0.3 10*3/uL (ref 0.0–0.7)
Eosinophils Relative: 3.3 % (ref 0.0–5.0)
HCT: 46 % (ref 39.0–52.0)
HEMOGLOBIN: 16.1 g/dL (ref 13.0–17.0)
Lymphocytes Relative: 30.4 % (ref 12.0–46.0)
Lymphs Abs: 3.1 10*3/uL (ref 0.7–4.0)
MCHC: 34.9 g/dL (ref 30.0–36.0)
MCV: 87.4 fl (ref 78.0–100.0)
MONO ABS: 0.7 10*3/uL (ref 0.1–1.0)
Monocytes Relative: 6.8 % (ref 3.0–12.0)
Neutro Abs: 6.1 10*3/uL (ref 1.4–7.7)
Neutrophils Relative %: 59 % (ref 43.0–77.0)
Platelets: 253 10*3/uL (ref 150.0–400.0)
RBC: 5.26 Mil/uL (ref 4.22–5.81)
RDW: 13.3 % (ref 11.5–15.5)
WBC: 10.3 10*3/uL (ref 4.0–10.5)

## 2016-11-04 LAB — PSA: PSA: 0.86 ng/mL (ref 0.10–4.00)

## 2016-11-04 LAB — LIPID PANEL
CHOLESTEROL: 105 mg/dL (ref 0–200)
HDL: 21.5 mg/dL — ABNORMAL LOW (ref >39.00)
Total CHOL/HDL Ratio: 5
Triglycerides: 521 mg/dL — ABNORMAL HIGH (ref 0.0–149.0)

## 2016-11-04 LAB — TSH: TSH: 0.31 u[IU]/mL — AB (ref 0.35–4.50)

## 2016-11-04 LAB — LDL CHOLESTEROL, DIRECT: Direct LDL: 30 mg/dL

## 2016-11-05 LAB — HEMOGLOBIN A1C
Hgb A1c MFr Bld: 10.2 % — ABNORMAL HIGH (ref <5.7)
Mean Plasma Glucose: 246 mg/dL

## 2016-11-11 ENCOUNTER — Encounter: Payer: Self-pay | Admitting: Family Medicine

## 2016-11-11 ENCOUNTER — Ambulatory Visit (INDEPENDENT_AMBULATORY_CARE_PROVIDER_SITE_OTHER): Payer: BLUE CROSS/BLUE SHIELD | Admitting: Family Medicine

## 2016-11-11 VITALS — BP 138/72 | HR 72 | Temp 98.3°F | Ht 71.0 in | Wt 210.8 lb

## 2016-11-11 DIAGNOSIS — F411 Generalized anxiety disorder: Secondary | ICD-10-CM | POA: Diagnosis not present

## 2016-11-11 DIAGNOSIS — I2510 Atherosclerotic heart disease of native coronary artery without angina pectoris: Secondary | ICD-10-CM | POA: Diagnosis not present

## 2016-11-11 DIAGNOSIS — Z0000 Encounter for general adult medical examination without abnormal findings: Secondary | ICD-10-CM

## 2016-11-11 DIAGNOSIS — E119 Type 2 diabetes mellitus without complications: Secondary | ICD-10-CM | POA: Diagnosis not present

## 2016-11-11 DIAGNOSIS — R911 Solitary pulmonary nodule: Secondary | ICD-10-CM

## 2016-11-11 DIAGNOSIS — G4700 Insomnia, unspecified: Secondary | ICD-10-CM

## 2016-11-11 MED ORDER — ZALEPLON 10 MG PO CAPS: 10.0000 mg | ORAL_CAPSULE | Freq: Every evening | ORAL | 5 refills | Status: DC | PRN

## 2016-11-11 NOTE — Assessment & Plan Note (Signed)
CAD- compliant with asa, statin, cardiology follow up. Has some significant blockages but aggrssive risk factor modification advised only without anginal symptoms

## 2016-11-11 NOTE — Progress Notes (Signed)
Phone: [phone]  Subjective:  Patient presents today for their annual physical. Chief complaint-noted.   See problem oriented charting- ROS- full  review of systems was completed and negative including No chest pain or shortness of breath. No headache or blurry vision. Admits to high sugars not improving  The following were reviewed and entered/updated in epic: Past Medical History:  Diagnosis Date  . Allergic rhinitis   . Anxiety   . Asthma   . Diabetes ([place])   . Diverticulitis   . History of heart attack   . HLD (hyperlipidemia)   . HTN (hypertension)   . Tubular adenoma of colon 09/2008   Patient Active Problem List   Diagnosis Date Noted  . Well controlled type 2 diabetes mellitus ([place]) 03/31/2010    Priority: High  . Coronary artery disease involving native coronary artery of native heart without angina pectoris 04/06/2007    Priority: High  . Insomnia 05/27/2014    Priority: Medium  . Asthma, moderate persistent 06/19/2008    Priority: Medium  . ERECTILE DYSFUNCTION, SECONDARY TO MEDICATION 05/17/2007    Priority: Medium  . Hyperlipidemia 04/06/2007    Priority: Medium  . Anxiety state 04/06/2007    Priority: Medium  . Essential hypertension 04/06/2007    Priority: Medium  . Fever blister 04/09/2015    Priority: Low  . Former smoker 11/04/2014    Priority: Low  . Plantar wart of left foot 11/27/2010    Priority: Low  . PSA, INCREASED 09/15/2009    Priority: Low  . Diverticulitis of colon 05/08/2009    Priority: Low  . ANKLE PAIN, CHRONIC 06/05/2007    Priority: Low  . Allergic rhinitis 05/17/2007    Priority: Low  . Abnormal nuclear stress test 07/20/2016   Past Surgical History:  Procedure Laterality Date  . CARDIAC CATHETERIZATION N/A 07/20/2016   Procedure: Left Heart Cath and Cors/Grafts Angiography;  Surgeon: Belva Crome, MD;  Location: [facility];  Service: Cardiovascular;  Laterality: N/A;  . CORONARY ANGIOPLASTY    . CORONARY  ARTERY BYPASS GRAFT  06/1999    Family History  Problem Relation Age of Onset  . Hypertension Mother   . Alzheimer's disease Mother   . Hyperlipidemia Father   . Hypertension Father   . Colon polyps Father   . Colon cancer Paternal Aunt   . Pancreatic cancer Neg Hx   . Stomach cancer Neg Hx     Medications- reviewed and updated Current Outpatient Prescriptions  Medication Sig Dispense Refill  . albuterol (PROVENTIL HFA;VENTOLIN HFA) 108 (90 Base) MCG/ACT inhaler Inhale 2 puffs into the lungs every 6 (six) hours as needed for wheezing or shortness of breath. 1 Inhaler 5  . ALPRAZolam (XANAX) 0.5 MG tablet TAKE 1 TABLET BY MOUTH EVERY 6 HOURS AS NEEDED 30 tablet 3  . amLODipine (NORVASC) 5 MG tablet TAKE 1 TABLET DAILY 90 tablet 1  . aspirin 325 MG tablet Take 325 mg by mouth daily.      . Azelastine-Fluticasone (DYMISTA) 137-50 MCG/ACT SUSP 1 spray each nostril bid (Patient taking differently: Place 1 spray into both nostrils daily as needed (congestion). ) 1 Bottle 5  . bisoprolol-hydrochlorothiazide (ZIAC) 10-6.25 MG tablet Take 1 tablet by mouth daily. 90 tablet 3  . Canagliflozin-Metformin HCl (INVOKAMET) [phone] MG TABS Take 1 tablet by mouth 2 (two) times daily. 90 tablet 3  . famciclovir (FAMVIR) 500 MG tablet TAKE 3 TABLETS BY MOUTH EVERY DAY AS NEEDED AT FIRST SIGN OF OUTBREAK  OF FEVER BLISTER 15 tablet 1  . fluticasone (FLONASE) 50 MCG/ACT nasal spray Place 2 sprays into both nostrils daily. (Patient taking differently: Place 2 sprays into both nostrils daily as needed for allergies. ) 16 g 0  . folic acid (FOLVITE) 657 MCG tablet Take 1,600 mcg by mouth daily.      Marland Kitchen glimepiride (AMARYL) 2 MG tablet Take 1 tablet (2 mg total) by mouth daily before breakfast. 90 tablet 3  . glucose blood (BAYER CONTOUR NEXT TEST) test strip Use to test blood sugars daily. Dx: E11.9 100 each 12  . Inositol Niacinate (NIACIN FLUSH FREE) 500 MG CAPS Take 1 capsule (500 mg total) by mouth daily.  180 each 6  . ketoconazole (NIZORAL) 2 % cream Apply 1 application topically daily. For rash in groin- tinea cruris (Patient taking differently: Apply 1 application topically daily as needed for irritation. For rash in groin- tinea cruris) 60 g 0  . losartan (COZAAR) 100 MG tablet TAKE 1 TABLET (100 MG TOTAL) BY MOUTH DAILY. 90 tablet 3  . Multiple Vitamin (MULTIVITAMIN) tablet Take 1 tablet by mouth daily.      . nitroGLYCERIN (NITROLINGUAL) 0.4 MG/SPRAY spray USE AS DIRECTED 30 g 2  . nitroGLYCERIN (NITROSTAT) 0.4 MG SL tablet Place 1 tablet (0.4 mg total) under the tongue every 5 (five) minutes as needed for chest pain. 30 tablet 4  . omega-3 acid ethyl esters (LOVAZA) 1 g capsule Take 1 capsule (1 g total) by mouth 3 (three) times daily. 90 capsule 3  . oxymetazoline (AFRIN) 0.05 % nasal spray Place 1 spray into both nostrils 2 (two) times daily as needed for congestion.    . predniSONE (DELTASONE) 20 MG tablet Take 1 tablet by mouth daily for 5 days, then 1/2 tablet daily for 2 days 6 tablet 0  . rosuvastatin (CRESTOR) 20 MG tablet Take 1 tablet (20 mg total) by mouth daily. 90 tablet 3  . SYMBICORT 160-4.5 MCG/ACT inhaler INHALE 2 PUFFS INTO THE LUNGS TWICE A DAY (Patient taking differently: INHALE 2 PUFFS INTO THE LUNGS TWICE A DAY AS NEEDED FOR SHORTNESS OF BREATH) 10.2 Inhaler 1  . zaleplon (SONATA) 10 MG capsule Take 1 capsule (10 mg total) by mouth at bedtime as needed. for sleep 30 capsule 5   No current facility-administered medications for this visit.     Allergies-reviewed and updated Allergies  Allergen Reactions  . Morphine Sulfate Nausea And Vomiting and Swelling    Social History   Social History  . Marital status: Married    Spouse name: N/A  . Number of children: N/A  . Years of education: N/A   Occupational History  . Banker    Social History Main Topics  . Smoking status: Former Smoker    Packs/day: 0.50    Years: 4.00    Types: Cigarettes    Quit date:  09/29/1977  . Smokeless tobacco: Never Used     Comment: quit on 1980  . Alcohol use 0.6 oz/week    1 Standard drinks or equivalent per week  . Drug use: No  . Sexual activity: Yes   Other Topics Concern  . None   Social History Narrative   Married  In 1981 with 2 kids (son and daughter). No grandkids.       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 Oropharynx normal Neck: no thyromegaly CV: RRR no murmurs rubs or gallops Lungs: CTAB no crackles, wheeze, rhonchi Abdomen: soft/nontender/nondistended/normal bowel sounds. overweight Ext: no edema Skin: warm, dry Neuro: grossly normal, moves all extremities, PERRLA Rectal: normal tone, normal sized prostate, no masses or tenderness  Diabetic Foot Exam - Simple   Simple Foot Form Diabetic Foot exam was performed with the following findings:  Yes 11/11/2016  9:13 AM  Visual Inspection No deformities, no ulcerations, no other skin breakdown bilaterally:  Yes Sensation Testing Intact to touch and monofilament testing bilaterally:  Yes Pulse Check Posterior Tibialis and Dorsalis pulse intact bilaterally:  Yes Comments    Assessment/Plan:  61 y.o. male presenting for annual physical.  Health Maintenance counseling: 1. Anticipatory guidance: Patient counseled regarding regular dental exams q6 months, eye exams yearly, wearing seatbelts.  2. Risk factor reduction:  Advised patient of need for regular exercise and diet rich and fruits and vegetables to reduce risk of heart attack and stroke. Exercise- has really fallen off- see DM. Diet-has really fallen off- see DM. May have some fluid loss from high  sugars with weight loss.  Wt Readings from Last 3 Encounters:  11/11/16 210 lb 12.8 oz (95.6 kg)  09/20/16 218 lb 12.8 oz (99.2 kg)  07/20/16 215 lb (97.5 kg)  3. Immunizations/screenings/ancillary studies Immunization History  Administered Date(s) Administered  . Influenza Split 05/12/2011, 04/25/2012  . Influenza Whole 05/17/2007, 05/10/2008, 06/06/2009, 06/10/2010  . Influenza,inj,Quad PF,36+ Mos 05/16/2013, 05/27/2014, 05/29/2015, 05/11/2016  . Pneumococcal Conjugate-13 08/14/2014  . Pneumococcal Polysaccharide-23 11/05/2015  . Td 08/09/2002  . Tdap 10/20/2012  . Zoster 07/18/2015   4. Prostate cancer screening-  low risk PSA trend and rectal exam Lab Results  Component Value Date   PSA 0.86 11/04/2016   PSA 1.21 10/29/2015   PSA 0.84 10/28/2014   5. Colon cancer screening - 2015 with 2020 repeat due to polyp 6. Skin cancer screening- Using sunscreen regularly. No dermatologist.   Status of chronic or acute concerns   HLD- compliant with crestor and lovaza. High triglycerides over 500 likely due to poorly controlled DM and dietary habits  HTN- controlled on losartan 100mg  , amlodipine 5mg , bisoprolol hctz 10-6.25 mg  Asthma- controlled on symbicort  Well controlled type 2 diabetes mellitus ([place]) S: very poorly controlled. On metformin 1g BID, invokana 300mg  (in combo form), amaryl 2mg  as drastic drop off in his exercise and eating habits.   Admits he stopped the amaryl.  CBGs- down to 110 this AM but was in 200s last week when found out about a1c- has really tightened up his diet. Started back walking daily and had really dropped off.  Lab Results  Component Value Date   HGBA1C 10.2 (H) 11/04/2016   HGBA1C 7.2 06/24/2016   HGBA1C 6.7 (H) 02/02/2016   A/P: given drastic improvement in cbg with dietary and exercise changes we opted for 14 week follow up with no changes in medicine. Could consider increase amaryl or januvia if not to goal at follow up   Coronary artery  disease involving native coronary artery of native heart without angina pectoris CAD- compliant with asa, statin, cardiology follow up. Has some significant blockages but aggrssive risk factor modification advised  only without anginal symptoms  Insomnia Insomnia- prn sonata. Wakes up 2-3 AM if not- so uses this most nights   Anxiety state Anxiety- using xanax about 4x a week for life stressors. Does not use on weekends or at least very rarely. Spaces this with sonata by at least 6-8 hours  Pulmonary nodule- noted 2016 in November with 1 year follow up- agrees to this today  Return in about 14 weeks (around 02/17/2017).  Meds ordered this encounter  Medications  . zaleplon (SONATA) 10 MG capsule    Sig: Take 1 capsule (10 mg total) by mouth at bedtime as needed. for sleep    Dispense:  30 capsule    Refill:  5    This request is for a new prescription for a controlled substance as required by Federal/State law.   Return precautions advised.  [name], MD

## 2016-11-11 NOTE — Assessment & Plan Note (Addendum)
S: very poorly controlled. On metformin 1g BID, invokana 300mg  (in combo form), amaryl 2mg  as drastic drop off in his exercise and eating habits.   Admits he stopped the amaryl.  CBGs- down to 110 this AM but was in 200s last week when found out about a1c- has really tightened up his diet. Started back walking daily and had really dropped off.  Lab Results  Component Value Date   HGBA1C 10.2 (H) 11/04/2016   HGBA1C 7.2 06/24/2016   HGBA1C 6.7 (H) 02/02/2016   A/P: given drastic improvement in cbg with dietary and exercise changes we opted for 14 week follow up with no changes in medicine. Could consider increase amaryl or januvia if not to goal at follow up

## 2016-11-11 NOTE — Assessment & Plan Note (Addendum)
Anxiety- using xanax about 4x a week for life stressors. Does not use on weekends or at least very rarely. Spaces this with sonata by at least 6-8 hours

## 2016-11-11 NOTE — Assessment & Plan Note (Signed)
Insomnia- prn sonata. Wakes up 2-3 AM if not- so uses this most nights

## 2016-11-11 NOTE — Patient Instructions (Addendum)
given drastic improvement in cbg with dietary and exercise changes we opted for 14 week follow up with no changes in medicine. Could consider increase amaryl or januvia if not to goal at follow up  No other changes  We will call you within a week or two about your referral to CT scan. If you do not hear within 3 weeks, give [place] a call.

## 2016-11-11 NOTE — Progress Notes (Signed)
Pre visit review using our clinic review tool, if applicable. No additional management support is needed unless otherwise documented below in the visit note. 

## 2016-11-16 DIAGNOSIS — E119 Type 2 diabetes mellitus without complications: Secondary | ICD-10-CM | POA: Diagnosis not present

## 2016-11-17 ENCOUNTER — Other Ambulatory Visit: Payer: Self-pay | Admitting: Family Medicine

## 2016-11-17 ENCOUNTER — Ambulatory Visit (INDEPENDENT_AMBULATORY_CARE_PROVIDER_SITE_OTHER)
Admission: RE | Admit: 2016-11-17 | Discharge: 2016-11-17 | Disposition: A | Discharge status: Home / Self Care | Payer: BLUE CROSS/BLUE SHIELD | Source: Ambulatory Visit | Attending: Family Medicine | Admitting: Family Medicine

## 2016-11-17 DIAGNOSIS — R911 Solitary pulmonary nodule: Secondary | ICD-10-CM | POA: Diagnosis not present

## 2016-11-30 DIAGNOSIS — R739 Hyperglycemia, unspecified: Secondary | ICD-10-CM | POA: Diagnosis not present

## 2016-12-07 ENCOUNTER — Other Ambulatory Visit: Payer: Self-pay | Admitting: Family Medicine

## 2016-12-08 ENCOUNTER — Other Ambulatory Visit: Payer: Self-pay

## 2016-12-08 ENCOUNTER — Encounter: Payer: Self-pay | Admitting: Family Medicine

## 2016-12-08 MED ORDER — ZALEPLON 10 MG PO CAPS: 10.0000 mg | ORAL_CAPSULE | Freq: Every evening | ORAL | 5 refills | Status: DC | PRN

## 2017-01-18 ENCOUNTER — Other Ambulatory Visit: Payer: Self-pay | Admitting: Family Medicine

## 2017-01-20 ENCOUNTER — Other Ambulatory Visit: Payer: Self-pay | Admitting: Family Medicine

## 2017-02-14 ENCOUNTER — Other Ambulatory Visit: Payer: Self-pay | Admitting: Family Medicine

## 2017-02-17 ENCOUNTER — Ambulatory Visit: Payer: BLUE CROSS/BLUE SHIELD | Admitting: Family Medicine

## 2017-03-04 ENCOUNTER — Other Ambulatory Visit: Payer: Self-pay | Admitting: Family Medicine

## 2017-03-15 ENCOUNTER — Encounter: Payer: Self-pay | Admitting: Family Medicine

## 2017-03-15 ENCOUNTER — Ambulatory Visit (INDEPENDENT_AMBULATORY_CARE_PROVIDER_SITE_OTHER): Payer: BLUE CROSS/BLUE SHIELD | Admitting: Family Medicine

## 2017-03-15 VITALS — BP 124/72 | HR 72 | Temp 98.7°F | Ht 71.0 in | Wt 213.6 lb

## 2017-03-15 DIAGNOSIS — R911 Solitary pulmonary nodule: Secondary | ICD-10-CM | POA: Insufficient documentation

## 2017-03-15 DIAGNOSIS — E785 Hyperlipidemia, unspecified: Secondary | ICD-10-CM | POA: Diagnosis not present

## 2017-03-15 DIAGNOSIS — E119 Type 2 diabetes mellitus without complications: Secondary | ICD-10-CM | POA: Diagnosis not present

## 2017-03-15 DIAGNOSIS — R7989 Other specified abnormal findings of blood chemistry: Secondary | ICD-10-CM

## 2017-03-15 DIAGNOSIS — R946 Abnormal results of thyroid function studies: Secondary | ICD-10-CM

## 2017-03-15 DIAGNOSIS — I10 Essential (primary) hypertension: Secondary | ICD-10-CM

## 2017-03-15 DIAGNOSIS — Q859 Phakomatosis, unspecified: Secondary | ICD-10-CM | POA: Insufficient documentation

## 2017-03-15 LAB — BASIC METABOLIC PANEL
BUN: 16 mg/dL (ref 6–23)
CALCIUM: 9.9 mg/dL (ref 8.4–10.5)
CO2: 26 meq/L (ref 19–32)
CREATININE: 0.92 mg/dL (ref 0.40–1.50)
Chloride: 100 mEq/L (ref 96–112)
GFR: 88.67 mL/min (ref >60.00)
GLUCOSE: 76 mg/dL (ref 70–99)
Potassium: 3.9 mEq/L (ref 3.5–5.1)
SODIUM: 137 meq/L (ref 135–145)

## 2017-03-15 LAB — TRIGLYCERIDES: Triglycerides: 178 mg/dL — ABNORMAL HIGH (ref 0.0–149.0)

## 2017-03-15 LAB — TSH: TSH: 0.24 u[IU]/mL — ABNORMAL LOW (ref 0.35–4.50)

## 2017-03-15 LAB — HEMOGLOBIN A1C: Hgb A1c MFr Bld: 8.2 % — ABNORMAL HIGH (ref 4.6–6.5)

## 2017-03-15 MED ORDER — SITAGLIPTIN PHOSPHATE 100 MG PO TABS: 100.0000 mg | ORAL_TABLET | Freq: Every day | ORAL | 5 refills | Status: DC

## 2017-03-15 NOTE — Assessment & Plan Note (Signed)
S: poorly controlled triglyceries last check on lovaza and crestor. No myalgias.  Lab Results  Component Value Date   CHOL 105 11/04/2016   HDL 21.50 (L) 11/04/2016   LDLCALC 47 10/29/2015   LDLDIRECT 30.0 11/04/2016   TRIG (H) 11/04/2016    521.0 Triglyceride is over 400; calculations on Lipids are invalid.   CHOLHDL 5 11/04/2016   A/P: likely related to poorly controlled diabetes- hopeful improved with improved diabetes control- update triglycerides

## 2017-03-15 NOTE — Addendum Note (Signed)
Addended by: Marin Olp on: 03/15/2017 05:46 PM   Modules accepted: Orders

## 2017-03-15 NOTE — Assessment & Plan Note (Signed)
S: hopefully controlled On metformin 1 g BID, invokana 300mg  (in combo), amaryl 2mg  CBGs- last visit had driven down cbgs to 549 or so after prior in 200 before finding out about high a1c- improved diet and started walking daily 1.5-2 miles a day. Weight stable. Avoiding sweet tea. This Am was 138 but as low as 129.   Weight up 3 lbs though Lab Results  Component Value Date   HGBA1C 10.2 (H) 11/04/2016   HGBA1C 7.2 06/24/2016   HGBA1C 6.7 (H) 02/02/2016    A/P: update a1c. Had discussed at last visit trial of amaryl increase or januvia if not at goal (with expectation woul dat least be back to 8 or so)- I do not think he has made a 2 point decrease though- may need addition/change in 2 meds- he has been less consistent with changes as he had hoped

## 2017-03-15 NOTE — Patient Instructions (Signed)
Please stop by lab before you go  Much of our decision for meds depends on a1c.   Hopeful we can do this with 1 medicine  (likely [place]) but definitely need 2 medicines if above 9 (increase amaryl and add [place])

## 2017-03-15 NOTE — Progress Notes (Signed)
Subjective:  Larry Garner is a 61 y.o. year old very pleasant male patient who presents for/with See problem oriented charting ROS- No chest pain or shortness of breath. No headache or blurry vision.  Denies hypoglycemia   Past Medical History-  Patient Active Problem List   Diagnosis Date Noted  . Well controlled type 2 diabetes mellitus ([place]) 03/31/2010    Priority: High  . Coronary artery disease involving native coronary artery of native heart without angina pectoris 04/06/2007    Priority: High  . Insomnia 05/27/2014    Priority: Medium  . Asthma, moderate persistent 06/19/2008    Priority: Medium  . ERECTILE DYSFUNCTION, SECONDARY TO MEDICATION 05/17/2007    Priority: Medium  . Hyperlipidemia 04/06/2007    Priority: Medium  . Anxiety state 04/06/2007    Priority: Medium  . Essential hypertension 04/06/2007    Priority: Medium  . Fever blister 04/09/2015    Priority: Low  . Former smoker 11/04/2014    Priority: Low  . Plantar wart of left foot 11/27/2010    Priority: Low  . PSA, INCREASED 09/15/2009    Priority: Low  . Diverticulitis of colon 05/08/2009    Priority: Low  . ANKLE PAIN, CHRONIC 06/05/2007    Priority: Low  . Allergic rhinitis 05/17/2007    Priority: Low  . Pulmonary nodule 03/15/2017  . Abnormal nuclear stress test 07/20/2016    Medications- reviewed and updated Current Outpatient Prescriptions  Medication Sig Dispense Refill  . albuterol (PROVENTIL HFA;VENTOLIN HFA) 108 (90 Base) MCG/ACT inhaler Inhale 2 puffs into the lungs every 6 (six) hours as needed for wheezing or shortness of breath. 1 Inhaler 5  . ALPRAZolam (XANAX) 0.5 MG tablet TAKE 1 TABLET BY MOUTH EVERY 6 HOURS AS NEEDED 30 tablet 1  . amLODipine (NORVASC) 5 MG tablet TAKE 1 TABLET DAILY 90 tablet 1  . aspirin 325 MG tablet Take 325 mg by mouth daily.      . Azelastine-Fluticasone (DYMISTA) 137-50 MCG/ACT SUSP 1 spray each nostril bid (Patient taking differently: Place 1 spray into  both nostrils daily as needed (congestion). ) 1 Bottle 5  . bisoprolol-hydrochlorothiazide (ZIAC) 10-6.25 MG tablet TAKE 1 TABLET DAILY 90 tablet 3  . famciclovir (FAMVIR) 500 MG tablet TAKE 3 TABLETS BY MOUTH EVERY DAY AS NEEDED AT FIRST SIGN OF OUTBREAK OF FEVER BLISTER 15 tablet 1  . fluticasone (FLONASE) 50 MCG/ACT nasal spray Place 2 sprays into both nostrils daily. (Patient taking differently: Place 2 sprays into both nostrils daily as needed for allergies. ) 16 g 0  . folic acid (FOLVITE) 409 MCG tablet Take 1,600 mcg by mouth daily.      Marland Kitchen glimepiride (AMARYL) 2 MG tablet TAKE 1 TABLET DAILY BEFORE BREAKFAST 90 tablet 3  . glucose blood (BAYER CONTOUR NEXT TEST) test strip Use to test blood sugars daily. Dx: E11.9 100 each 12  . Inositol Niacinate (NIACIN FLUSH FREE) 500 MG CAPS Take 1 capsule (500 mg total) by mouth daily. 180 each 6  . INVOKAMET [phone] MG TABS TAKE 1 TABLET TWICE A DAY 60 tablet 5  . ketoconazole (NIZORAL) 2 % cream Apply 1 application topically daily. For rash in groin- tinea cruris (Patient taking differently: Apply 1 application topically daily as needed for irritation. For rash in groin- tinea cruris) 60 g 0  . losartan (COZAAR) 100 MG tablet TAKE 1 TABLET DAILY 90 tablet 3  . Multiple Vitamin (MULTIVITAMIN) tablet Take 1 tablet by mouth daily.      Marland Kitchen  nitroGLYCERIN (NITROLINGUAL) 0.4 MG/SPRAY spray USE AS DIRECTED 30 g 2  . nitroGLYCERIN (NITROSTAT) 0.4 MG SL tablet PLACE 1 TABLET (0.4 MG TOTAL) UNDER THE TONGUE EVERY 5 (FIVE) MINUTES AS NEEDED FOR CHEST PAIN. 30 tablet 0  . omega-3 acid ethyl esters (LOVAZA) 1 g capsule Take 1 capsule (1 g total) by mouth 3 (three) times daily. 90 capsule 3  . oxymetazoline (AFRIN) 0.05 % nasal spray Place 1 spray into both nostrils 2 (two) times daily as needed for congestion.    . rosuvastatin (CRESTOR) 20 MG tablet TAKE 1 TABLET DAILY 90 tablet 3  . SYMBICORT 160-4.5 MCG/ACT inhaler INHALE 2 PUFFS INTO THE LUNGS TWICE A DAY  (Patient taking differently: INHALE 2 PUFFS INTO THE LUNGS TWICE A DAY AS NEEDED FOR SHORTNESS OF BREATH) 10.2 Inhaler 1  . zaleplon (SONATA) 10 MG capsule Take 1 capsule (10 mg total) by mouth at bedtime as needed. for sleep 30 capsule 5   No current facility-administered medications for this visit.     Objective: BP 124/72 (BP Location: Left Arm, Patient Position: Sitting, Cuff Size: Large)   Pulse 72   Temp 98.7 F (37.1 C) (Oral)   Ht 5\' 11"  (1.803 m)   Wt 213 lb 9.6 oz (96.9 kg)   SpO2 96%   BMI 29.79 kg/m  Gen: NAD, resting comfortably CV: RRR no murmurs rubs or gallops Lungs: CTAB no crackles, wheeze, rhonchi Abdomen: soft/nontender/nondistended/normal bowel sounds. overweight Ext: no edema Skin: warm, dry  Assessment/Plan:  Hypertension S: controlled on amlodipine 10mg  and ziac and losartan BP Readings from Last 3 Encounters:  03/15/17 124/72  11/11/16 138/72  09/20/16 132/70  A/P: We discussed blood pressure goal of <140/90. Continue current meds  Hyperlipidemia S: poorly controlled triglyceries last check on lovaza and crestor. No myalgias.  Lab Results  Component Value Date   CHOL 105 11/04/2016   HDL 21.50 (L) 11/04/2016   LDLCALC 47 10/29/2015   LDLDIRECT 30.0 11/04/2016   TRIG (H) 11/04/2016    521.0 Triglyceride is over 400; calculations on Lipids are invalid.   CHOLHDL 5 11/04/2016   A/P: likely related to poorly controlled diabetes- hopeful improved with improved diabetes control- update triglycerides  Well controlled type 2 diabetes mellitus ([place]) S: hopefully controlled On metformin 1 g BID, invokana 300mg  (in combo), amaryl 2mg  CBGs- last visit had driven down cbgs to 213 or so after prior in 200 before finding out about high a1c- improved diet and started walking daily 1.5-2 miles a day. Weight stable. Avoiding sweet tea. This Am was 138 but as low as 129.   Weight up 3 lbs though Lab Results  Component Value Date   HGBA1C 10.2 (H) 11/04/2016    HGBA1C 7.2 06/24/2016   HGBA1C 6.7 (H) 02/02/2016    A/P: update a1c. Had discussed at last visit trial of amaryl increase or januvia if not at goal (with expectation woul dat least be back to 8 or so)- I do not think he has made a 2 point decrease though- may need addition/change in 2 meds- he has been less consistent with changes as he had hoped  14 weeks  Orders Placed This Encounter  Procedures  . TSH    [place]  . Hemoglobin A1c    [place]  . Basic metabolic panel    [place]  . Triglycerides   Return precautions advised.  [name], MD

## 2017-04-12 DIAGNOSIS — E119 Type 2 diabetes mellitus without complications: Secondary | ICD-10-CM | POA: Diagnosis not present

## 2017-04-12 LAB — HM DIABETES EYE EXAM

## 2017-04-20 ENCOUNTER — Encounter: Payer: Self-pay | Admitting: Family Medicine

## 2017-04-27 ENCOUNTER — Encounter: Payer: Self-pay | Admitting: Family Medicine

## 2017-04-27 ENCOUNTER — Other Ambulatory Visit: Payer: Self-pay | Admitting: Family Medicine

## 2017-04-28 NOTE — Telephone Encounter (Signed)
Rx phoned into pharmacy.

## 2017-05-08 ENCOUNTER — Other Ambulatory Visit: Payer: Self-pay | Admitting: Family Medicine

## 2017-05-09 ENCOUNTER — Ambulatory Visit (INDEPENDENT_AMBULATORY_CARE_PROVIDER_SITE_OTHER): Payer: BLUE CROSS/BLUE SHIELD

## 2017-05-09 DIAGNOSIS — Z23 Encounter for immunization: Secondary | ICD-10-CM

## 2017-05-12 ENCOUNTER — Encounter: Payer: Self-pay | Admitting: Family Medicine

## 2017-05-13 NOTE — Telephone Encounter (Signed)
Spoke to pt, asked him what Rx do you need filled? We have not received anything from [facility]. Pt said he will check and let us know on Monday. Told pt okay.

## 2017-06-16 ENCOUNTER — Other Ambulatory Visit: Payer: Self-pay

## 2017-06-16 MED ORDER — ZALEPLON 10 MG PO CAPS: 10.0000 mg | ORAL_CAPSULE | Freq: Every evening | ORAL | 5 refills | Status: DC | PRN

## 2017-06-29 ENCOUNTER — Encounter: Payer: Self-pay | Admitting: Cardiovascular Disease

## 2017-06-29 ENCOUNTER — Other Ambulatory Visit: Payer: Self-pay

## 2017-06-29 MED ORDER — ALPRAZOLAM 0.5 MG PO TABS: 0.5000 mg | ORAL_TABLET | Freq: Four times a day (QID) | ORAL | 1 refills | Status: DC | PRN

## 2017-07-05 ENCOUNTER — Ambulatory Visit: Payer: BLUE CROSS/BLUE SHIELD | Admitting: Family Medicine

## 2017-07-07 NOTE — Progress Notes (Signed)
Cardiology Office Note   Date:  07/11/2017   ID:  Garner, Larry 1955/11/06, MRN [id number]  PCP:  Marin Olp, MD  Cardiologist:   Jenkins Rouge, MD   No chief complaint on file.     History of Present Illness: Larry Garner is a 61 y.o. male f/u  CAD/CABG Had previous stent to LAD with restenosis Seen by Lia Foyer   CABG [place]:06/19/1999  LIMA LAD,  SVG D1 SVG OM1  He is still banking. Knows Ignatius Specking well. No chest pain. Previously mostly had Dyspnea before bypass. Wife retired from [employer]. Two children daughter in [place] son just moved to [facility] area  He is somewhat sedentary but does walk Some SSCP/Dyspnea December  Myovue 07/14/16 abnormal   The patient walked on a standard Bruce protocol GXT for 9:30. Peak HR of 142 which is 89% predicted maximal HR. The patient had a RBBB with associated ST / T wave changes at baseline and these changes worsened with exercise.  Hypertensive response to exercise.  Defect 1: There is a medium defect present in the basal inferior, apical anterior, apical inferior and apex location.  Findings consistent with prior inferior wall myocardial infarction with a very small area of peri-infarct ischemia.  This is an intermediate risk study.  The left ventricular ejection fraction is mildly decreased (45-54%). The EF is 49%.  Cath Dr Tamala Julian 07/20/16 reviewed Grafts patient newly diagnosed native RCA occlusion with collaterals   Widely patent previously placed bypass grafts including LIMA to mid LAD, saphenous graft to diagonal, and saphenous vein graft to obtuse marginal #1/ramus intermedius.  Total occlusion of the proximal LAD in stent.  Total occlusion of the obtuse marginal #1/ramus intermedius  60-70% stenosis in the native first diagonal  Total occlusion of the native mid right coronary collateralized by both right to right and left-to-right collaterals. The right coronary is  dominant.  Normal left ventricular hemodynamics. LVEF 45-50 %.  Recommendations:   Continue aggressive risk factor modification.  In absence of cardiac symptoms, medical therapy seems most appropriate. Therefore, CTO recanalization will not be pursued..   Past Medical History:  Diagnosis Date  . Allergic rhinitis   . Anxiety   . Asthma   . Diabetes ([place])   . Diverticulitis   . History of heart attack   . HLD (hyperlipidemia)   . HTN (hypertension)   . Tubular adenoma of colon 09/2008    Past Surgical History:  Procedure Laterality Date  . CARDIAC CATHETERIZATION N/A 07/20/2016   Procedure: Left Heart Cath and Cors/Grafts Angiography;  Surgeon: Belva Crome, MD;  Location: [facility];  Service: Cardiovascular;  Laterality: N/A;  . CORONARY ANGIOPLASTY    . CORONARY ARTERY BYPASS GRAFT  06/1999     Current Outpatient Medications  Medication Sig Dispense Refill  . albuterol (PROVENTIL HFA;VENTOLIN HFA) 108 (90 Base) MCG/ACT inhaler Inhale 2 puffs into the lungs every 6 (six) hours as needed for wheezing or shortness of breath. 1 Inhaler 5  . ALPRAZolam (XANAX) 0.5 MG tablet Take 1 tablet (0.5 mg total) by mouth every 6 (six) hours as needed. 30 tablet 1  . amLODipine (NORVASC) 5 MG tablet TAKE 1 TABLET DAILY 90 tablet 1  . Azelastine-Fluticasone (DYMISTA) 137-50 MCG/ACT SUSP 1 spray each nostril bid (Patient taking differently: Place 1 spray into both nostrils daily as needed (congestion). ) 1 Bottle 5  . bisoprolol-hydrochlorothiazide (ZIAC) 10-6.25 MG tablet TAKE 1 TABLET DAILY 90 tablet  3  . famciclovir (FAMVIR) 500 MG tablet TAKE 3 TABLETS BY MOUTH EVERY DAY AS NEEDED AT FIRST SIGN OF OUTBREAK OF FEVER BLISTER 15 tablet 1  . fluticasone (FLONASE) 50 MCG/ACT nasal spray Place 2 sprays into both nostrils daily. (Patient taking differently: Place 2 sprays into both nostrils daily as needed for allergies. ) 16 g 0  . folic acid (FOLVITE) 510 MCG tablet Take 1,600 mcg  by mouth daily.      Marland Kitchen glimepiride (AMARYL) 2 MG tablet TAKE 1 TABLET DAILY BEFORE BREAKFAST 90 tablet 3  . glucose blood (BAYER CONTOUR NEXT TEST) test strip Use to test blood sugars daily. Dx: E11.9 100 each 12  . INVOKAMET [phone] MG TABS TAKE 1 TABLET TWICE A DAY 60 tablet 5  . losartan (COZAAR) 100 MG tablet TAKE 1 TABLET DAILY 90 tablet 3  . Multiple Vitamin (MULTIVITAMIN) tablet Take 1 tablet by mouth daily.      . nitroGLYCERIN (NITROLINGUAL) 0.4 MG/SPRAY spray USE AS DIRECTED 30 g 2  . nitroGLYCERIN (NITROSTAT) 0.4 MG SL tablet PLACE 1 TABLET (0.4 MG TOTAL) UNDER THE TONGUE EVERY 5 (FIVE) MINUTES AS NEEDED FOR CHEST PAIN. 30 tablet 0  . omega-3 acid ethyl esters (LOVAZA) 1 g capsule TAKE 1 CAPSULE THREE TIMES A DAY 90 capsule 3  . oxymetazoline (AFRIN) 0.05 % nasal spray Place 1 spray into both nostrils 2 (two) times daily as needed for congestion.    . rosuvastatin (CRESTOR) 20 MG tablet TAKE 1 TABLET DAILY 90 tablet 3  . sitaGLIPtin (JANUVIA) 100 MG tablet Take 1 tablet (100 mg total) by mouth daily. 30 tablet 5  . SYMBICORT 160-4.5 MCG/ACT inhaler INHALE 2 PUFFS INTO THE LUNGS TWICE A DAY (Patient taking differently: INHALE 2 PUFFS INTO THE LUNGS TWICE A DAY AS NEEDED FOR SHORTNESS OF BREATH) 10.2 Inhaler 1  . zaleplon (SONATA) 10 MG capsule Take 1 capsule (10 mg total) at bedtime as needed by mouth. for sleep 30 capsule 5  . aspirin EC 81 MG tablet Take 1 tablet (81 mg total) by mouth daily. 90 tablet 3   No current facility-administered medications for this visit.     Allergies:   Morphine sulfate    Social History:  The patient  reports that he quit smoking about 39 years ago. His smoking use included cigarettes. He has a 2.00 pack-year smoking history. he has never used smokeless tobacco. He reports that he drinks about 0.6 oz of alcohol per week. He reports that he does not use drugs.   Family History:  The patient's family history includes Alzheimer's disease in his mother;  Colon cancer in his paternal aunt; Colon polyps in his father; Hyperlipidemia in his father; Hypertension in his father and mother.    ROS:  Please see the history of present illness.   Otherwise, review of systems are positive for none.   All other systems are reviewed and negative.    PHYSICAL EXAM: VS:  BP 130/78   Pulse 60   Ht 5\' 11"  (1.803 m)   Wt 217 lb (98.4 kg)   SpO2 97%   BMI 30.27 kg/m  , BMI Body mass index is 30.27 kg/m. Affect appropriate Healthy:  appears stated age 22: normal Neck supple with no adenopathy JVP normal no bruits no thyromegaly Lungs clear with no wheezing and good diaphragmatic motion Heart:  S1/S2 no murmur, no rub, gallop or click PMI normal post sternotimy Abdomen: benighn, BS positve, no tenderness, no AAA no bruit.  No HSM or HJR Distal pulses intact with no bruits No edema Neuro non-focal Skin warm and dry No muscular weakness     EKG:   02/09/16 SR RBBB LAD lateral T wave changes  06/29/16 SR rat 17 RBBB LAD LVH ? Old IMI  07/11/17 SR RBBB LAFB old IMI   Recent Labs: 11/04/2016: ALT 24; Hemoglobin 16.1; Platelets 253.0 03/15/2017: BUN 16; Creatinine, Ser 0.92; Potassium 3.9; Sodium 137; TSH 0.24    Lipid Panel    Component Value Date/Time   CHOL 105 11/04/2016 0809   TRIG 178.0 (H) 03/15/2017 1346   HDL 21.50 (L) 11/04/2016 0809   CHOLHDL 5 11/04/2016 0809   VLDL 31.6 10/29/2015 0805   LDLCALC 47 10/29/2015 0805   LDLDIRECT 30.0 11/04/2016 0809      Wt Readings from Last 3 Encounters:  07/11/17 217 lb (98.4 kg)  03/15/17 213 lb 9.6 oz (96.9 kg)  11/11/16 210 lb 12.8 oz (95.6 kg)      Other studies Reviewed: Additional studies/ records that were reviewed today include: CABG report Old cath notes Dr Lia Foyer notes primary Dr Yong Channel .    ASSESSMENT AND PLAN:  1. CAD/CABG:   Cath 07/20/16 patent grafts RCA not grafted occluded with collaterals medical Rx 2. Chol: at goal continue statin labs with primary 3. HTN:   Well controlled.  Continue current medications and low sodium Dash type diet.   4. DM:  Discussed low carb diet.  Target hemoglobin A1c is 6.5 or less.  Continue current medications.    Current medicines are reviewed at length with the patient today.  The patient does not have concerns regarding medicines.  The following changes have been made:  no change  Labs/ tests ordered today include: None   Orders Placed This Encounter  Procedures  . EKG 12-Lead     Disposition:   FU with me in a year      Signed, Jenkins Rouge, MD  07/11/2017 2:46 PM    [facility] [address], [place], [place]  [address] Phone: [phone]; Fax: [phone]

## 2017-07-11 ENCOUNTER — Ambulatory Visit (INDEPENDENT_AMBULATORY_CARE_PROVIDER_SITE_OTHER): Payer: BLUE CROSS/BLUE SHIELD | Admitting: Cardiovascular Disease

## 2017-07-11 ENCOUNTER — Encounter: Payer: Self-pay | Admitting: Cardiovascular Disease

## 2017-07-11 VITALS — BP 130/78 | HR 60 | Ht 71.0 in | Wt 217.0 lb

## 2017-07-11 DIAGNOSIS — I25708 Atherosclerosis of coronary artery bypass graft(s), unspecified, with other forms of angina pectoris: Secondary | ICD-10-CM | POA: Diagnosis not present

## 2017-07-11 DIAGNOSIS — Z951 Presence of aortocoronary bypass graft: Secondary | ICD-10-CM

## 2017-07-11 MED ORDER — ASPIRIN EC 81 MG PO TBEC: 81.0000 mg | DELAYED_RELEASE_TABLET | Freq: Every day | ORAL | 3 refills | Status: DC

## 2017-07-11 NOTE — Patient Instructions (Addendum)
Medication Instructions:  Your physician has recommended you make the following change in your medication:  1-Decrease Aspirin 81 mg by mouth daily  Labwork: NONE  Testing/Procedures: NONE  Follow-Up: Your physician wants you to follow-up in: 12  months with Dr. Johnsie Cancel. You will receive a reminder letter in the mail two months in advance. If you don't receive a letter, please call our office to schedule the follow-up appointment.   If you need a refill on your cardiac medications before your next appointment, please call your pharmacy.

## 2017-07-15 ENCOUNTER — Encounter: Payer: Self-pay | Admitting: Family Medicine

## 2017-07-15 ENCOUNTER — Ambulatory Visit (INDEPENDENT_AMBULATORY_CARE_PROVIDER_SITE_OTHER): Payer: BLUE CROSS/BLUE SHIELD | Admitting: Family Medicine

## 2017-07-15 VITALS — BP 132/80 | HR 69 | Temp 98.1°F | Ht 71.0 in | Wt 218.2 lb

## 2017-07-15 DIAGNOSIS — E119 Type 2 diabetes mellitus without complications: Secondary | ICD-10-CM

## 2017-07-15 DIAGNOSIS — E785 Hyperlipidemia, unspecified: Secondary | ICD-10-CM

## 2017-07-15 DIAGNOSIS — I10 Essential (primary) hypertension: Secondary | ICD-10-CM | POA: Diagnosis not present

## 2017-07-15 NOTE — Patient Instructions (Addendum)
Great job with getting a1c down. Still want to get this below 7 which will take getting weight back down.

## 2017-07-15 NOTE — Progress Notes (Signed)
Subjective:  Larry Garner is a 61 y.o. year old very pleasant male patient who presents for/with See problem oriented charting ROS- No chest pain or shortness of breath. No headache or blurry vision.  No hypoglycemia   Past Medical History-  Patient Active Problem List   Diagnosis Date Noted  . Well controlled type 2 diabetes mellitus ([place]) 03/31/2010    Priority: High  . Coronary artery disease involving native coronary artery of native heart without angina pectoris 04/06/2007    Priority: High  . Insomnia 05/27/2014    Priority: Medium  . Asthma, moderate persistent 06/19/2008    Priority: Medium  . ERECTILE DYSFUNCTION, SECONDARY TO MEDICATION 05/17/2007    Priority: Medium  . Hyperlipidemia 04/06/2007    Priority: Medium  . Anxiety state 04/06/2007    Priority: Medium  . Essential hypertension 04/06/2007    Priority: Medium  . Fever blister 04/09/2015    Priority: Low  . Former smoker 11/04/2014    Priority: Low  . Plantar wart of left foot 11/27/2010    Priority: Low  . PSA, INCREASED 09/15/2009    Priority: Low  . Diverticulitis of colon 05/08/2009    Priority: Low  . ANKLE PAIN, CHRONIC 06/05/2007    Priority: Low  . Allergic rhinitis 05/17/2007    Priority: Low  . Pulmonary nodule 03/15/2017  . Abnormal nuclear stress test 07/20/2016    Medications- reviewed and updated Current Outpatient Medications  Medication Sig Dispense Refill  . albuterol (PROVENTIL HFA;VENTOLIN HFA) 108 (90 Base) MCG/ACT inhaler Inhale 2 puffs into the lungs every 6 (six) hours as needed for wheezing or shortness of breath. 1 Inhaler 5  . ALPRAZolam (XANAX) 0.5 MG tablet Take 1 tablet (0.5 mg total) by mouth every 6 (six) hours as needed. 30 tablet 1  . amLODipine (NORVASC) 5 MG tablet TAKE 1 TABLET DAILY 90 tablet 1  . aspirin EC 81 MG tablet Take 1 tablet (81 mg total) by mouth daily. 90 tablet 3  . Azelastine-Fluticasone (DYMISTA) 137-50 MCG/ACT SUSP 1 spray each nostril bid  (Patient taking differently: Place 1 spray into both nostrils daily as needed (congestion). ) 1 Bottle 5  . bisoprolol-hydrochlorothiazide (ZIAC) 10-6.25 MG tablet TAKE 1 TABLET DAILY 90 tablet 3  . famciclovir (FAMVIR) 500 MG tablet TAKE 3 TABLETS BY MOUTH EVERY DAY AS NEEDED AT FIRST SIGN OF OUTBREAK OF FEVER BLISTER 15 tablet 1  . fluticasone (FLONASE) 50 MCG/ACT nasal spray Place 2 sprays into both nostrils daily. (Patient taking differently: Place 2 sprays into both nostrils daily as needed for allergies. ) 16 g 0  . folic acid (FOLVITE) 811 MCG tablet Take 1,600 mcg by mouth daily.      Marland Kitchen glimepiride (AMARYL) 2 MG tablet TAKE 1 TABLET DAILY BEFORE BREAKFAST 90 tablet 3  . glucose blood (BAYER CONTOUR NEXT TEST) test strip Use to test blood sugars daily. Dx: E11.9 100 each 12  . INVOKAMET [phone] MG TABS TAKE 1 TABLET TWICE A DAY 60 tablet 5  . losartan (COZAAR) 100 MG tablet TAKE 1 TABLET DAILY 90 tablet 3  . Multiple Vitamin (MULTIVITAMIN) tablet Take 1 tablet by mouth daily.      . nitroGLYCERIN (NITROLINGUAL) 0.4 MG/SPRAY spray USE AS DIRECTED 30 g 2  . nitroGLYCERIN (NITROSTAT) 0.4 MG SL tablet PLACE 1 TABLET (0.4 MG TOTAL) UNDER THE TONGUE EVERY 5 (FIVE) MINUTES AS NEEDED FOR CHEST PAIN. 30 tablet 0  . omega-3 acid ethyl esters (LOVAZA) 1 g capsule TAKE 1  CAPSULE THREE TIMES A DAY 90 capsule 3  . oxymetazoline (AFRIN) 0.05 % nasal spray Place 1 spray into both nostrils 2 (two) times daily as needed for congestion.    . rosuvastatin (CRESTOR) 20 MG tablet TAKE 1 TABLET DAILY 90 tablet 3  . sitaGLIPtin (JANUVIA) 100 MG tablet Take 1 tablet (100 mg total) by mouth daily. 30 tablet 5  . SYMBICORT 160-4.5 MCG/ACT inhaler INHALE 2 PUFFS INTO THE LUNGS TWICE A DAY (Patient taking differently: INHALE 2 PUFFS INTO THE LUNGS TWICE A DAY AS NEEDED FOR SHORTNESS OF BREATH) 10.2 Inhaler 1  . zaleplon (SONATA) 10 MG capsule Take 1 capsule (10 mg total) at bedtime as needed by mouth. for sleep 30  capsule 5   No current facility-administered medications for this visit.     Objective: BP 132/80 (BP Location: Left Arm, Patient Position: Sitting, Cuff Size: Large)   Pulse 69   Temp 98.1 F (36.7 C) (Oral)   Ht 5\' 11"  (1.803 m)   Wt 218 lb 3.2 oz (99 kg)   SpO2 95%   BMI 30.43 kg/m  Gen: NAD, resting comfortably CV: RRR no murmurs rubs or gallops Lungs: CTAB no crackles, wheeze, rhonchi Ext: no edema Skin: warm, dry Normal gait  Assessment/Plan: Wt Readings from Last 3 Encounters:  07/15/17 218 lb 3.2 oz (99 kg)  07/11/17 217 lb (98.4 kg)  03/15/17 213 lb 9.6 oz (96.9 kg)        Hypertension S: controlled on amlodipine 10mg , ziac, losartan.  BP Readings from Last 3 Encounters:  07/15/17 132/80  07/11/17 130/78  03/15/17 124/72  A/P: We discussed blood pressure goal of <140/90. Continue current meds:    Hyperlipidemia S: well controlled on last check on lovaza and crestor 10mg . No myalgias.  Lab Results  Component Value Date   CHOL 105 11/04/2016   HDL 21.50 (L) 11/04/2016   LDLCALC 47 10/29/2015   LDLDIRECT 30.0 11/04/2016   TRIG 178.0 (H) 03/15/2017   CHOLHDL 5 11/04/2016   A/P: continue current medicine  Well controlled type 2 diabetes mellitus ([place]) S: poorly controlled. On metformin 1g BID, invokana 300mg  (in combo), amaryl 19m.  last visit. We added januvia at last visit.  CBGs-  133 this AM.  Exercise and diet-  weight up 5 lbs- thanksgiving and birthday Lab Results  Component Value Date   HGBA1C 8.2 (H) 03/15/2017   HGBA1C 10.2 (H) 11/04/2016   HGBA1C 7.2 06/24/2016   A/P: a1c down to 7.3! Significant improvement- to get below 7 we discussed focusing on lifestyle over increasing medication. a1c needs to be entered by nursing staff  Interested in PSA at CPE. Will come fasting. Will plan to check in on how son's marriage in [place] went.   Future Appointments  Date Time Provider [place]  11/14/2017  8:30 AM Marin Olp, MD  [facility]   Return in about 4 months (around 11/13/2017) for physical, come fasting.  Return precautions advised.  [name], MD

## 2017-07-16 ENCOUNTER — Encounter: Payer: Self-pay | Admitting: Family Medicine

## 2017-07-16 NOTE — Assessment & Plan Note (Addendum)
S: poorly controlled. On metformin 1g BID, invokana 300mg  (in combo), amaryl 25m.  last visit. We added januvia at last visit.  CBGs-  133 this AM.  Exercise and diet-  weight up 5 lbs- thanksgiving and birthday Lab Results  Component Value Date   HGBA1C 8.2 (H) 03/15/2017   HGBA1C 10.2 (H) 11/04/2016   HGBA1C 7.2 06/24/2016   A/P: a1c down to 7.3! Significant improvement- to get below 7 we discussed focusing on lifestyle over increasing medication. a1c needs to be entered by nursing staff

## 2017-07-20 LAB — POCT GLYCOSYLATED HEMOGLOBIN (HGB A1C): Hemoglobin A1C: 7.3

## 2017-07-20 NOTE — Addendum Note (Signed)
Addended by: Lyndle Herrlich on: 07/20/2017 02:26 PM   Modules accepted: Orders

## 2017-08-04 ENCOUNTER — Other Ambulatory Visit: Payer: Self-pay | Admitting: Family Medicine

## 2017-08-12 ENCOUNTER — Other Ambulatory Visit: Payer: Self-pay

## 2017-08-12 MED ORDER — SITAGLIPTIN PHOSPHATE 100 MG PO TABS: 100.0000 mg | ORAL_TABLET | Freq: Every day | ORAL | 5 refills | Status: DC

## 2017-09-05 ENCOUNTER — Other Ambulatory Visit: Payer: Self-pay | Admitting: Family Medicine

## 2017-09-05 ENCOUNTER — Encounter: Payer: Self-pay | Admitting: Family Medicine

## 2017-09-05 MED ORDER — NITROGLYCERIN 0.4 MG SL SUBL: 0.4000 mg | SUBLINGUAL_TABLET | SUBLINGUAL | 0 refills | Status: DC | PRN

## 2017-09-08 ENCOUNTER — Encounter: Payer: Self-pay | Admitting: Family Medicine

## 2017-09-08 ENCOUNTER — Other Ambulatory Visit: Payer: Self-pay

## 2017-09-08 MED ORDER — ALPRAZOLAM 0.5 MG PO TABS: 0.5000 mg | ORAL_TABLET | Freq: Four times a day (QID) | ORAL | 2 refills | Status: DC | PRN

## 2017-09-10 MED ORDER — ALPRAZOLAM 0.5 MG PO TABS: 0.5000 mg | ORAL_TABLET | Freq: Four times a day (QID) | ORAL | 2 refills | Status: DC | PRN

## 2017-09-27 ENCOUNTER — Other Ambulatory Visit: Payer: Self-pay

## 2017-09-27 ENCOUNTER — Encounter: Payer: Self-pay | Admitting: Family Medicine

## 2017-09-27 MED ORDER — FAMCICLOVIR 500 MG PO TABS: ORAL_TABLET | ORAL | 1 refills | Status: DC

## 2017-11-14 ENCOUNTER — Other Ambulatory Visit: Payer: Self-pay

## 2017-11-14 ENCOUNTER — Encounter: Payer: Self-pay | Admitting: Family Medicine

## 2017-11-14 ENCOUNTER — Ambulatory Visit (INDEPENDENT_AMBULATORY_CARE_PROVIDER_SITE_OTHER): Payer: BLUE CROSS/BLUE SHIELD | Admitting: Family Medicine

## 2017-11-14 VITALS — BP 122/70 | HR 72 | Temp 98.8°F | Ht 71.0 in | Wt 215.0 lb

## 2017-11-14 DIAGNOSIS — E785 Hyperlipidemia, unspecified: Secondary | ICD-10-CM

## 2017-11-14 DIAGNOSIS — R7989 Other specified abnormal findings of blood chemistry: Secondary | ICD-10-CM

## 2017-11-14 DIAGNOSIS — E119 Type 2 diabetes mellitus without complications: Secondary | ICD-10-CM

## 2017-11-14 DIAGNOSIS — Z125 Encounter for screening for malignant neoplasm of prostate: Secondary | ICD-10-CM | POA: Diagnosis not present

## 2017-11-14 DIAGNOSIS — Z0000 Encounter for general adult medical examination without abnormal findings: Secondary | ICD-10-CM

## 2017-11-14 DIAGNOSIS — R0683 Snoring: Secondary | ICD-10-CM | POA: Diagnosis not present

## 2017-11-14 LAB — CBC
HCT: 46 % (ref 39.0–52.0)
Hemoglobin: 15.8 g/dL (ref 13.0–17.0)
MCHC: 34.4 g/dL (ref 30.0–36.0)
MCV: 88.4 fl (ref 78.0–100.0)
PLATELETS: 313 10*3/uL (ref 150.0–400.0)
RBC: 5.2 Mil/uL (ref 4.22–5.81)
RDW: 13.1 % (ref 11.5–15.5)
WBC: 8.3 10*3/uL (ref 4.0–10.5)

## 2017-11-14 LAB — COMPREHENSIVE METABOLIC PANEL
ALBUMIN: 4.3 g/dL (ref 3.5–5.2)
ALK PHOS: 65 U/L (ref 39–117)
ALT: 14 U/L (ref 0–53)
AST: 15 U/L (ref 0–37)
BILIRUBIN TOTAL: 0.6 mg/dL (ref 0.2–1.2)
BUN: 14 mg/dL (ref 6–23)
CO2: 24 mEq/L (ref 19–32)
CREATININE: 0.95 mg/dL (ref 0.40–1.50)
Calcium: 9.6 mg/dL (ref 8.4–10.5)
Chloride: 102 mEq/L (ref 96–112)
GFR: 85.26 mL/min (ref >60.00)
Glucose, Bld: 97 mg/dL (ref 70–99)
Potassium: 3.8 mEq/L (ref 3.5–5.1)
SODIUM: 139 meq/L (ref 135–145)
TOTAL PROTEIN: 7.5 g/dL (ref 6.0–8.3)

## 2017-11-14 LAB — PSA: PSA: 1.2 ng/mL (ref 0.10–4.00)

## 2017-11-14 LAB — T4, FREE: FREE T4: 0.9 ng/dL (ref 0.60–1.60)

## 2017-11-14 LAB — LIPID PANEL
CHOLESTEROL: 98 mg/dL (ref 0–200)
HDL: 23 mg/dL — ABNORMAL LOW (ref >39.00)
LDL Cholesterol: 40 mg/dL (ref 0–99)
NonHDL: 74.58
Total CHOL/HDL Ratio: 4
Triglycerides: 171 mg/dL — ABNORMAL HIGH (ref 0.0–149.0)
VLDL: 34.2 mg/dL (ref 0.0–40.0)

## 2017-11-14 LAB — TSH: TSH: 0.29 u[IU]/mL — ABNORMAL LOW (ref 0.35–4.50)

## 2017-11-14 LAB — T3, FREE: T3, Free: 4 pg/mL (ref 2.3–4.2)

## 2017-11-14 LAB — HEMOGLOBIN A1C: HEMOGLOBIN A1C: 8.4 % — AB (ref 4.6–6.5)

## 2017-11-14 MED ORDER — NITROGLYCERIN 0.4 MG/SPRAY TL SOLN: 2 refills | Status: DC

## 2017-11-14 MED ORDER — GLUCOSE BLOOD VI STRP: ORAL_STRIP | 12 refills | Status: DC

## 2017-11-14 MED ORDER — SITAGLIPTIN PHOSPHATE 100 MG PO TABS: 100.0000 mg | ORAL_TABLET | Freq: Every day | ORAL | 5 refills | Status: DC

## 2017-11-14 NOTE — Assessment & Plan Note (Signed)
S:  controlled. On metformin 1g BID, invokana 300mg  (as part of combo), amaryl 2mg , januvia CBGs- last 2 days has been 98 and 103.  Lab Results  Component Value Date   HGBA1C 7.3 07/20/2017   HGBA1C 8.2 (H) 03/15/2017   HGBA1C 10.2 (H) 11/04/2016   A/P: update a1c. Movement is key for him and has done really well lately with this

## 2017-11-14 NOTE — Progress Notes (Signed)
Please let us know by responding to this when you get this mychart message and let us know if you understand the results/directions  Unfortunately a1c has gone up- I know sugars have looked good in recent days but wonder if prior weeks were higher. How does Larry Garner feel about a once a week injection? I am considering subbing out his Januvia for ozempic (we could have him in for a visit to go over how to use this and order supplies. This may reduce a1c by if we are lucky half a point so he would need to continue his regular exercise and healthier eating. One benefit of this medicine is it is associated with weight loss.   His thyroid remains slightly overactive. If he is ok with it- I would like to refer him to Dr. Cruzita Lederer of endocrinology under subclinical hyperthyroidism for her opinion (basically the #s are off but we dont think it is causing many symptoms). Since he continues to have levels that are off I want her opinion as it can increase cardiac risk if present long term and we know he has heart disease. The other thing I thought of is perhaps this is causing sleep issues as often an overactive thyroid can contribute to poor sleep  Your CBC was normal (blood counts, infection fighting cells, platelets). Your CMET was normal (kidney, liver, and electrolytes, blood sugar).  Your cholesterol looks great.  Your BMET was normal (kidney, electrolytes, blood sugar). Your thyroid was normal.

## 2017-11-14 NOTE — Progress Notes (Signed)
Phone: [phone]  Subjective:  Patient presents today for their annual physical. Chief complaint-noted.   See problem oriented charting- ROS- full  review of systems was completed and negative except for: seasonal allergies, runny nose, congestion  The following were reviewed and entered/updated in epic: Past Medical History:  Diagnosis Date  . Allergic rhinitis   . Anxiety   . Asthma   . Diabetes ([place])   . Diverticulitis   . History of heart attack   . HLD (hyperlipidemia)   . HTN (hypertension)   . Tubular adenoma of colon 09/2008   Patient Active Problem List   Diagnosis Date Noted  . Well controlled type 2 diabetes mellitus ([place]) 03/31/2010    Priority: High  . Coronary artery disease involving native coronary artery of native heart without angina pectoris 04/06/2007    Priority: High  . Insomnia 05/27/2014    Priority: Medium  . Asthma, moderate persistent 06/19/2008    Priority: Medium  . ERECTILE DYSFUNCTION, SECONDARY TO MEDICATION 05/17/2007    Priority: Medium  . Hyperlipidemia 04/06/2007    Priority: Medium  . Anxiety state 04/06/2007    Priority: Medium  . Essential hypertension 04/06/2007    Priority: Medium  . Fever blister 04/09/2015    Priority: Low  . Former smoker 11/04/2014    Priority: Low  . Plantar wart of left foot 11/27/2010    Priority: Low  . PSA, INCREASED 09/15/2009    Priority: Low  . Diverticulitis of colon 05/08/2009    Priority: Low  . ANKLE PAIN, CHRONIC 06/05/2007    Priority: Low  . Allergic rhinitis 05/17/2007    Priority: Low  . Pulmonary nodule 03/15/2017  . Abnormal nuclear stress test 07/20/2016   Past Surgical History:  Procedure Laterality Date  . CARDIAC CATHETERIZATION N/A 07/20/2016   Procedure: Left Heart Cath and Cors/Grafts Angiography;  Surgeon: Belva Crome, MD;  Location: [facility];  Service: Cardiovascular;  Laterality: N/A;  . CORONARY ANGIOPLASTY    . CORONARY ARTERY BYPASS GRAFT  06/1999      Family History  Problem Relation Age of Onset  . Hypertension Mother   . Alzheimer's disease Mother   . Hyperlipidemia Father   . Hypertension Father   . Colon polyps Father   . Colon cancer Paternal Aunt   . Pancreatic cancer Neg Hx   . Stomach cancer Neg Hx     Medications- reviewed and updated Current Outpatient Medications  Medication Sig Dispense Refill  . albuterol (PROVENTIL HFA;VENTOLIN HFA) 108 (90 Base) MCG/ACT inhaler Inhale 2 puffs into the lungs every 6 (six) hours as needed for wheezing or shortness of breath. 1 Inhaler 5  . ALPRAZolam (XANAX) 0.5 MG tablet Take 1 tablet (0.5 mg total) by mouth every 6 (six) hours as needed. 30 tablet 2  . amLODipine (NORVASC) 5 MG tablet TAKE 1 TABLET DAILY 90 tablet 1  . aspirin EC 81 MG tablet Take 1 tablet (81 mg total) by mouth daily. 90 tablet 3  . Azelastine-Fluticasone (DYMISTA) 137-50 MCG/ACT SUSP 1 spray each nostril bid (Patient taking differently: Place 1 spray into both nostrils daily as needed (congestion). ) 1 Bottle 5  . bisoprolol-hydrochlorothiazide (ZIAC) 10-6.25 MG tablet TAKE 1 TABLET DAILY 90 tablet 3  . famciclovir (FAMVIR) 500 MG tablet TAKE 3 TABLETS BY MOUTH EVERY DAY AS NEEDED AT FIRST SIGN OF OUTBREAK OF FEVER BLISTER 15 tablet 1  . folic acid (FOLVITE) 758 MCG tablet Take 1,600 mcg by mouth daily.      Marland Kitchen  glimepiride (AMARYL) 2 MG tablet TAKE 1 TABLET DAILY BEFORE BREAKFAST 90 tablet 3  . glucose blood (BAYER CONTOUR NEXT TEST) test strip Use to test blood sugars daily. Dx: E11.9 100 each 12  . INVOKAMET [phone] MG TABS TAKE 1 TABLET TWICE A DAY 60 tablet 5  . losartan (COZAAR) 100 MG tablet TAKE 1 TABLET DAILY 90 tablet 3  . Multiple Vitamin (MULTIVITAMIN) tablet Take 1 tablet by mouth daily.      . nitroGLYCERIN (NITROLINGUAL) 0.4 MG/SPRAY spray USE AS DIRECTED 30 g 2  . nitroGLYCERIN (NITROSTAT) 0.4 MG SL tablet Place 1 tablet (0.4 mg total) under the tongue every 5 (five) minutes as needed for chest  pain. 30 tablet 0  . omega-3 acid ethyl esters (LOVAZA) 1 g capsule TAKE 1 CAPSULE THREE TIMES A DAY 90 capsule 3  . oxymetazoline (AFRIN) 0.05 % nasal spray Place 1 spray into both nostrils 2 (two) times daily as needed for congestion.    . rosuvastatin (CRESTOR) 20 MG tablet TAKE 1 TABLET DAILY 90 tablet 3  . sitaGLIPtin (JANUVIA) 100 MG tablet Take 1 tablet (100 mg total) by mouth daily. 30 tablet 5  . SYMBICORT 160-4.5 MCG/ACT inhaler INHALE 2 PUFFS INTO THE LUNGS TWICE A DAY (Patient taking differently: INHALE 2 PUFFS INTO THE LUNGS TWICE A DAY AS NEEDED FOR SHORTNESS OF BREATH) 10.2 Inhaler 1  . zaleplon (SONATA) 10 MG capsule Take 1 capsule (10 mg total) at bedtime as needed by mouth. for sleep 30 capsule 5   No current facility-administered medications for this visit.    Allergies-reviewed and updated Allergies  Allergen Reactions  . Morphine Sulfate Nausea And Vomiting and Swelling    Social History   Social History Narrative   Married  In 1981 with 2 kids (son and daughter). No grandkids.       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 Oropharynx normal Neck: no thyromegaly CV: RRR no murmurs rubs or gallops Lungs: CTAB no crackles, wheeze, rhonchi Abdomen: soft/nontender/nondistended/normal bowel sounds. No rebound or guarding.  Ext: no edema Skin: warm, dry Neuro: grossly normal, moves all extremities, PERRLA Rectal: normal tone, normal sized prostate (on large side of normal), no masses or tenderness  Diabetic Foot Exam - Simple   Simple Foot Form Diabetic Foot exam was performed with the following findings:  Yes 11/14/2017  8:42 AM    Visual Inspection No deformities, no ulcerations, no other skin breakdown bilaterally:  Yes Sensation Testing Intact to touch and monofilament testing bilaterally:  Yes Pulse Check Posterior Tibialis and Dorsalis pulse intact bilaterally:  Yes Comments    Assessment/Plan:  62 y.o. male presenting for annual physical.  Health Maintenance counseling: 1. Anticipatory guidance: Patient counseled regarding regular dental exams -q6 months, eye exams -yearly, wearing seatbelts.  2. Risk factor reduction:  Advised patient of need for regular exercise and diet rich and fruits and vegetables to reduce risk of heart attack and stroke. He has lost 3 lbs since last visit. Back to his regular walking and really helping- more active in the yard as well.  Has learned how rough coke can be on his sugar so has cut down- rare to have one a week now. Wife cooking with diabetes in mind.  Wt Readings from Last 3 Encounters:  11/14/17 215 lb (97.5 kg)  07/15/17 218 lb 3.2 oz (99 kg)  07/11/17 217 lb (98.4 kg)  3. Immunizations/screenings/ancillary studies-up to date other than- discussed shingrix option at pharmacy  Immunization History  Administered Date(s) Administered  . Influenza Split 05/12/2011, 04/25/2012  . Influenza Whole 05/17/2007, 05/10/2008, 06/06/2009, 06/10/2010  . Influenza,inj,Quad PF,6+ Mos 05/16/2013, 05/27/2014, 05/29/2015, 05/11/2016, 05/09/2017  . Pneumococcal Conjugate-13 08/14/2014  . Pneumococcal Polysaccharide-23 11/05/2015  . Td 08/09/2002  . Tdap 10/20/2012  . Zoster 07/18/2015   4. Prostate cancer screening-  low risk PSA trend in past- update today. Low risk rectal exam. Nocturia 1-2x a night pretty stable Lab Results  Component Value Date   PSA 0.86 11/04/2016   PSA 1.21 10/29/2015   PSA 0.84 10/28/2014   5. Colon cancer screening - will be due for repeat in 2020 due to polyp history. Last 2015.  6. Skin cancer screening- no dermatologist . advised regular sunscreen use.  Denies worrisome, changing, or new skin lesions. Waist up exam without any glaring concerns but discussed I am not a dermatologist. Wife sees Dr. Ubaldo Glassing- he may see dermatol in future.   Status of chronic or acute concerns   HLD- remains on crestor and lovaza. Update lipids. Triglycerides over 500 in past on regimen due to poor control DM  HTN- BP looks great on losartan 100mg , amlodipine 5mg , ziac 10-6.25 mg   Asthma- doing well on symbicort. Maybe 1 issue over last year.   CAD- remains on aspirin, statin. Has cardiology follow up- aggressive risk factor modification as long as no anginal symptoms- does have some blockages. Still not using nitroglycerin  Insomnia- prn sonata- using most nights as wakes up 2-3 Am if not  Anxiety- still using xanax about 4x a week with business mornings- uses sparingly on weekends. Knows to space 6-8 hours from sonata  Allergies- using dysmista-  also neti pot. Has to mow yard with mask.   Worried about OSA- snoring heavily. After lunch can go to sleep at anytime even sitting up.   Low tsh- update tsh, t3, t4- may need to further evaluation Lab Results  Component Value Date   TSH 0.24 (L) 03/15/2017   Well controlled type 2 diabetes mellitus ([place]) S:  controlled. On metformin 1g BID, invokana 300mg  (as part of combo), amaryl 2mg , januvia CBGs- last 2 days has been 98 and 103.  Lab Results  Component Value Date   HGBA1C 7.3 07/20/2017   HGBA1C 8.2 (H) 03/15/2017   HGBA1C 10.2 (H) 11/04/2016   A/P: update a1c. Movement is key for him and has done really well lately with this   No future appointments. No follow-ups on file.  Lab/Order associations: Preventative health care - Plan: CBC, Comprehensive metabolic panel, Lipid panel, PSA, Hemoglobin A1c, TSH, T4, free, T3, free  Well controlled type 2 diabetes mellitus ([place]) - Plan: CBC, Comprehensive metabolic panel, Lipid panel, Hemoglobin A1c  Snoring - Plan: Ambulatory referral to  Pulmonology  Hyperlipidemia, unspecified hyperlipidemia type - Plan: CBC, Comprehensive metabolic panel, Lipid panel  Screening for prostate cancer - Plan: PSA  Low TSH level - Plan: TSH, T4, free, T3, free  Meds ordered this encounter  Medications  . nitroGLYCERIN (NITROLINGUAL) 0.4 MG/SPRAY spray    Sig: USE AS DIRECTED    Dispense:  30 g    Refill:  2  . glucose blood (BAYER CONTOUR NEXT TEST) test strip    Sig: Use to test blood sugars daily. Dx: E11.9    Dispense:  100 each    Refill:  12    Return precautions advised.  [name], MD

## 2017-11-14 NOTE — Patient Instructions (Addendum)
We will call you within a week or two about your referral to pulmonology for sleep apnea testing. If you do not hear within 3 weeks, give [place] a call.   Hoping for a great a1c! Great job on the walking and weight loss

## 2017-11-15 ENCOUNTER — Other Ambulatory Visit: Payer: Self-pay

## 2017-11-15 DIAGNOSIS — E0590 Thyrotoxicosis, unspecified without thyrotoxic crisis or storm: Secondary | ICD-10-CM

## 2017-11-15 MED ORDER — SITAGLIPTIN PHOSPHATE 100 MG PO TABS: 100.0000 mg | ORAL_TABLET | Freq: Every day | ORAL | 1 refills | Status: DC

## 2017-11-15 MED ORDER — GLUCOSE BLOOD VI STRP: ORAL_STRIP | 11 refills | Status: DC

## 2017-12-14 ENCOUNTER — Encounter: Payer: Self-pay | Admitting: Family Medicine

## 2017-12-14 ENCOUNTER — Other Ambulatory Visit: Payer: Self-pay | Admitting: Family Medicine

## 2017-12-15 ENCOUNTER — Other Ambulatory Visit: Payer: Self-pay

## 2017-12-15 MED ORDER — ZALEPLON 10 MG PO CAPS: 10.0000 mg | ORAL_CAPSULE | Freq: Every evening | ORAL | 5 refills | Status: DC | PRN

## 2017-12-27 NOTE — Progress Notes (Signed)
Patient ID: Larry Garner, male   DOB: 1955-10-19, 62 y.o.   MRN: [id number]                                                                                                              Reason for Appointment: ?  Hyperthyroidism, new consultation  Referring physician: Yong Channel   Chief complaint: Difficulty sleeping   History of Present Illness:   He has had a long-standing history of sleep difficulties with late insomnia He does feel somewhat tired during the day but he also has a busy schedule  He does not have any symptoms of palpitations, shakiness, feeling excessively warm and sweaty, nervousness, and fatigue. May have a little sweating transiently at night sometimes  He has not had any unexpected weight loss  He has had periodic routine thyroid levels done over the last few years and because his TSH has been persistently low since 3/18 he has been referred here for further management Review of labs show that he has had a low normal TSH for several years also  He has no history of goiter, does not feel any swelling in his neck and no sensation of pressure or tightness in his neck, no difficulty swallowing  He has not been on any treatment with amiodarone  Wt Readings from Last 3 Encounters:  12/28/17 217 lb 9.6 oz (98.7 kg)  11/14/17 215 lb (97.5 kg)  07/15/17 218 lb 3.2 oz (99 kg)     Thyroid function tests as follows:     Lab Results  Component Value Date   FREET4 0.90 11/14/2017   FREET4 0.96 02/02/2016   T3FREE 4.0 11/14/2017   T3FREE 3.2 02/02/2016   TSH 0.29 (L) 11/14/2017   TSH 0.24 (L) 03/15/2017   TSH 0.31 (L) 11/04/2016   Lab Results  Component Value Date   TSH 0.29 (L) 11/14/2017   TSH 0.24 (L) 03/15/2017   TSH 0.31 (L) 11/04/2016   TSH 0.37 02/02/2016   TSH 0.24 (L) 10/29/2015   TSH 0.36 10/28/2014   TSH 0.48 10/15/2013   TSH 0.49 10/16/2012     No results found for: THYROTRECAB   Allergies as of 12/28/2017      Reactions   Morphine  Sulfate Nausea And Vomiting, Swelling      Medication List        Accurate as of 12/28/17  3:32 PM. Always use your most recent med list.          albuterol 108 (90 Base) MCG/ACT inhaler Commonly known as:  PROVENTIL HFA;VENTOLIN HFA Inhale 2 puffs into the lungs every 6 (six) hours as needed for wheezing or shortness of breath.   ALPRAZolam 0.5 MG tablet Commonly known as:  XANAX TAKE 1 TABLET (0.5 MG TOTAL) BY MOUTH EVERY 6 (SIX) HOURS AS NEEDED.   amLODipine 5 MG tablet Commonly known as:  NORVASC TAKE 1 TABLET DAILY   aspirin EC 81 MG tablet Take 1 tablet (81 mg total) by mouth daily.   Azelastine-Fluticasone 137-50 MCG/ACT Susp Commonly known  as:  DYMISTA 1 spray each nostril bid   bisoprolol-hydrochlorothiazide 10-6.25 MG tablet Commonly known as:  ZIAC TAKE 1 TABLET DAILY   famciclovir 500 MG tablet Commonly known as:  FAMVIR TAKE 3 TABLETS BY MOUTH EVERY DAY AS NEEDED AT FIRST SIGN OF OUTBREAK OF FEVER BLISTER   folic acid 557 MCG tablet Commonly known as:  FOLVITE Take 1,600 mcg by mouth daily.   glimepiride 2 MG tablet Commonly known as:  AMARYL TAKE 1 TABLET DAILY BEFORE BREAKFAST   glucose blood test strip Commonly known as:  BAYER CONTOUR NEXT TEST Use to test blood sugars daily. Dx: E11.9   glucose blood test strip Commonly known as:  ONETOUCH VERIO Use to test blood sugars daily.   INVOKAMET [phone] MG Tabs Generic drug:  Canagliflozin-metFORMIN HCl TAKE 1 TABLET TWICE A DAY   losartan 100 MG tablet Commonly known as:  COZAAR TAKE 1 TABLET DAILY   multivitamin tablet Take 1 tablet by mouth daily.   nitroGLYCERIN 0.4 MG SL tablet Commonly known as:  NITROSTAT Place 1 tablet (0.4 mg total) under the tongue every 5 (five) minutes as needed for chest pain.   nitroGLYCERIN 0.4 MG/SPRAY spray Commonly known as:  NITROLINGUAL USE AS DIRECTED   omega-3 acid ethyl esters 1 g capsule Commonly known as:  LOVAZA TAKE 1 CAPSULE THREE TIMES  A DAY   oxymetazoline 0.05 % nasal spray Commonly known as:  AFRIN Place 1 spray into both nostrils 2 (two) times daily as needed for congestion.   rosuvastatin 20 MG tablet Commonly known as:  CRESTOR TAKE 1 TABLET DAILY   sitaGLIPtin 100 MG tablet Commonly known as:  JANUVIA Take 1 tablet (100 mg total) by mouth daily.   SYMBICORT 160-4.5 MCG/ACT inhaler Generic drug:  budesonide-formoterol INHALE 2 PUFFS INTO THE LUNGS TWICE A DAY   zaleplon 10 MG capsule Commonly known as:  SONATA Take 1 capsule (10 mg total) by mouth at bedtime as needed. for sleep           Past Medical History:  Diagnosis Date  . Allergic rhinitis   . Anxiety   . Asthma   . Diabetes ([place])   . Diverticulitis   . History of heart attack   . HLD (hyperlipidemia)   . HTN (hypertension)   . Tubular adenoma of colon 09/2008    Past Surgical History:  Procedure Laterality Date  . CARDIAC CATHETERIZATION N/A 07/20/2016   Procedure: Left Heart Cath and Cors/Grafts Angiography;  Surgeon: Belva Crome, MD;  Location: [facility];  Service: Cardiovascular;  Laterality: N/A;  . CORONARY ANGIOPLASTY    . CORONARY ARTERY BYPASS GRAFT  06/1999    Family History  Problem Relation Age of Onset  . Hypertension Mother   . Alzheimer's disease Mother   . Hyperlipidemia Father   . Hypertension Father   . Colon polyps Father   . Colon cancer Paternal Aunt   . Pancreatic cancer Neg Hx   . Stomach cancer Neg Hx   . Thyroid disease Neg Hx     Social History:  reports that he quit smoking about 40 years ago. His smoking use included cigarettes. He has a 2.00 pack-year smoking history. He has never used smokeless tobacco. He reports that he drinks about 0.6 oz of alcohol per week. He reports that he does not use drugs.  Allergies:  Allergies  Allergen Reactions  . Morphine Sulfate Nausea And Vomiting and Swelling     Review of Systems  Constitutional: Negative for reduced appetite.  HENT:  Negative for hoarseness and trouble swallowing.   Respiratory: Negative for shortness of breath.   Cardiovascular: Negative for chest pain.  Gastrointestinal: Negative for constipation.  Endocrine: Positive for fatigue, decreased libido and erectile dysfunction.       About 20 years ago he was told to have a low testosterone level and was treated with injections, reportedly with subjective improvement but not clear why the injections were stopped  Musculoskeletal: Positive for joint pain.  Skin: Negative for rash.  Neurological: Negative for weakness and tremors.  Psychiatric/Behavioral: Positive for insomnia.   DIABETES: He says that his blood sugar control has been variable  Lab Results  Component Value Date   HGBA1C 8.4 (H) 11/14/2017   HGBA1C 7.3 07/20/2017   HGBA1C 8.2 (H) 03/15/2017   Lab Results  Component Value Date   MICROALBUR <30 11/04/2016   LDLCALC 40 11/14/2017   CREATININE 0.95 11/14/2017       Examination:   BP 132/74 (BP Location: Left Arm, Patient Position: Sitting, Cuff Size: Normal)   Pulse 61   Ht 5\' 11"  (1.803 m)   Wt 217 lb 9.6 oz (98.7 kg)   SpO2 97%   BMI 30.35 kg/m    General Appearance:  well-built and has mild generalized obesity, pleasant, not anxious    Eyes: No abnormal prominence, lid lag or stare present.  No swelling of the eyelids   Neck: The thyroid is nonpalpable  There is no lymphadenopathy in the neck .           Heart: normal S1 and S2, no murmurs .          Lungs: breath sounds are normal bilaterally without added sounds  Abdomen: no hepatosplenomegaly or other palpable abnormality  Extremities: hands are not unusually warm or diaphoretic. No ankle edema.  Neurological:  No fine tremors are present. Deep tendon reflexes at biceps are normal.  Skin: No rash, abnormal thickening of the skin on the lower legs seen     Assessment/Plan:  Subclinical hyperthyroidism  He has had only a minimally reduced TSH level and  high normal T3 level Do not think he has any symptoms related to his mildly abnormal thyroid levels which have been fairly stable recently also No goiter on exam  Patient likely has an autonomous thyroid but will need to consider a hot nodule as a cause of his abnormal labs  Discussed that currently he does not need any treatment because of his lack of typical thyroid related symptoms Will need to evaluate further with a thyroid scan and uptake If he does have a hot nodule it may be necessary to ablate this to potentially reduce chances of overt hyperthyroidism and effects on heart rhythm in the future  Fatigue, decreased libido and erectile dysfunction associated with diabetes and also obesity He has a prior history of hypogonadism and unclear why treatment was stopped several years ago  He does need reevaluation of his free testosterone and consider treatment if this is low and related to pituitary dysfunction  Patient understands the above discussion and treatment options. All questions were answered satisfactorily  Consult note sent to referring physician  Elayne Snare 12/28/2017, 3:32 PM    Note: This office note was prepared with Dragon voice recognition system technology. Any transcriptional errors that result from this process are unintentional.

## 2017-12-28 ENCOUNTER — Ambulatory Visit (INDEPENDENT_AMBULATORY_CARE_PROVIDER_SITE_OTHER): Payer: BLUE CROSS/BLUE SHIELD | Admitting: Endocrinology

## 2017-12-28 ENCOUNTER — Encounter: Payer: Self-pay | Admitting: Endocrinology

## 2017-12-28 VITALS — BP 132/74 | HR 61 | Ht 71.0 in | Wt 217.6 lb

## 2017-12-28 DIAGNOSIS — Z8639 Personal history of other endocrine, nutritional and metabolic disease: Secondary | ICD-10-CM

## 2017-12-28 DIAGNOSIS — E0590 Thyrotoxicosis, unspecified without thyrotoxic crisis or storm: Secondary | ICD-10-CM

## 2017-12-28 DIAGNOSIS — R6882 Decreased libido: Secondary | ICD-10-CM | POA: Diagnosis not present

## 2018-01-09 ENCOUNTER — Encounter: Payer: Self-pay | Admitting: Pulmonary Disease

## 2018-01-09 ENCOUNTER — Ambulatory Visit (INDEPENDENT_AMBULATORY_CARE_PROVIDER_SITE_OTHER)
Admission: RE | Admit: 2018-01-09 | Discharge: 2018-01-09 | Disposition: A | Discharge status: Home / Self Care | Payer: BLUE CROSS/BLUE SHIELD | Source: Ambulatory Visit | Attending: Pulmonary Disease | Admitting: Pulmonary Disease

## 2018-01-09 ENCOUNTER — Ambulatory Visit (INDEPENDENT_AMBULATORY_CARE_PROVIDER_SITE_OTHER): Payer: BLUE CROSS/BLUE SHIELD | Admitting: Pulmonary Disease

## 2018-01-09 DIAGNOSIS — Q859 Phakomatosis, unspecified: Secondary | ICD-10-CM

## 2018-01-09 DIAGNOSIS — R918 Other nonspecific abnormal finding of lung field: Secondary | ICD-10-CM | POA: Diagnosis not present

## 2018-01-09 DIAGNOSIS — G4733 Obstructive sleep apnea (adult) (pediatric): Secondary | ICD-10-CM | POA: Diagnosis not present

## 2018-01-09 NOTE — Progress Notes (Signed)
Subjective:    Patient ID: Larry Garner, male    DOB: [birth year], 62 y.o.   MRN: [id number]  HPI   Chief Complaint  Patient presents with  . Sleep Consult    Referred by Dr. Yong Channel for possible OSA. Per patient's wife and children, he snores at night. Denies ever having a sleep study or CPAP machine before.      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 His wife has noted loud snoring for many years.  He feels that he does not sleep well through the night. Epworth sleepiness score is 9 and he reports sleepiness while watching TV, as a passenger in a car or lying down to rest in the afternoon. He reports occasional week and naps for about 30 minutes which are refreshing. Bedtime is around 10:30 PM, sleep latency is minimal, he sleeps on his right side with one pillow and often turns over in his stomach, reports 1-2 nocturnal awakenings.  Very often he will wake up around 2 AM and then take a tablet of some out and then fall asleep again, after going to the downstairs bedroom when he turns on the TV and in the air. He wakes up around 7 AM feeling tired occasionally with dryness of mouth. He has lost about 30 pounds in the last 2 years  There is no history suggestive of cataplexy, sleep paralysis or parasomnias   He carries a diagnosis of persistent asthma triggered by allergies but admits to not taking his Symbicort except on an as-needed basis.  He is almost never used his albuterol/rescue inhaler. 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 Allergic rhinitis   . Anxiety   . Asthma   . Diabetes ([place])   . Diverticulitis   . History of heart attack   . HLD (hyperlipidemia)   . HTN (hypertension)   . Tubular adenoma of colon  09/2008   Past Surgical History:  Procedure Laterality Date  . CARDIAC CATHETERIZATION N/A 07/20/2016   Procedure: Left Heart Cath and Cors/Grafts Angiography;  Surgeon: Belva Crome, MD;  Location: [facility];  Service: Cardiovascular;  Laterality: N/A;  . CORONARY ANGIOPLASTY    . CORONARY ARTERY BYPASS GRAFT  06/1999    Allergies  Allergen Reactions  . Morphine Sulfate Nausea And Vomiting and Swelling    Social History   Socioeconomic History  . Marital status: Married    Spouse name: Not on file  . Number of children: Not on file  . Years of education: Not on file  . Highest education level: Not on file  Occupational History  . Occupation: Visual merchandiser  . Financial resource strain: Not on file  . Food insecurity:    Worry: Not on file    Inability: Not on file  . Transportation needs:    Medical: Not on file    Non-medical: Not on file  Tobacco Use  . Smoking status: Former Smoker    Packs/day: 0.50    Years: 4.00    Pack years: 2.00    Types: Cigarettes    Last attempt to quit: 09/29/1977    Years since quitting: 40.3  . Smokeless tobacco: Never Used  . Tobacco comment: quit  on 1980  Substance and Sexual Activity  . Alcohol use: Yes    Alcohol/week: 0.6 oz    Types: 1 Standard drinks or equivalent per week  . Drug use: No  . Sexual activity: Yes  Lifestyle  . Physical activity:    Days per week: Not on file    Minutes per session: Not on file  . Stress: Not on file  Relationships  . Social connections:    Talks on phone: Not on file    Gets together: Not on file    Attends religious service: Not on file    Active member of club or organization: Not on file    Attends meetings of clubs or organizations: Not on file    Relationship status: Not on file  . Intimate partner violence:    Fear of current or ex partner: Not on file    Emotionally abused: Not on file    Physically abused: Not on file    Forced sexual activity: Not on file    Other Topics Concern  . Not on file  Social History Narrative   Married  In 1981 with 2 kids (son and daughter). No grandkids.       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 Hypertension Mother   . Alzheimer's disease Mother   . Hyperlipidemia Father   . Hypertension Father   . Colon polyps Father   . Colon cancer Paternal Aunt   . Pancreatic cancer Neg Hx   . Stomach cancer Neg Hx   . Thyroid disease Neg Hx       Review of Systems Positive for nasal congestion, anxiety  Constitutional: negative for anorexia, fevers and sweats  Eyes: negative for irritation, redness and visual disturbance  Ears, nose, mouth, throat, and face: negative for earaches, epistaxis, nasal congestion and sore throat  Respiratory: negative for cough, dyspnea on exertion, sputum and wheezing  Cardiovascular: negative for chest pain, dyspnea, lower extremity edema, orthopnea, palpitations and syncope  Gastrointestinal: negative for abdominal pain, constipation, diarrhea, melena, nausea and vomiting  Genitourinary:negative for dysuria, frequency and hematuria  Hematologic/lymphatic: negative for bleeding, easy bruising and lymphadenopathy  Musculoskeletal:negative for arthralgias, muscle weakness and stiff joints  Neurological: negative for coordination problems, gait problems, headaches and weakness  Endocrine: negative for diabetic symptoms including polydipsia, polyuria and weight loss     Objective:   Physical Exam  Gen. Pleasant, well-nourished, in no distress, normal affect ENT -class 2 airway, no post nasal drip Neck: No JVD, no thyromegaly, no carotid bruits Lungs: no use of accessory muscles, no dullness to percussion, clear without rales or rhonchi  Cardiovascular: Rhythm regular, heart sounds  normal, no murmurs or gallops, no peripheral edema Abdomen: soft and non-tender, no  hepatosplenomegaly, BS normal. Musculoskeletal: No deformities, no cyanosis or clubbing Neuro:  alert, non focal        Assessment & Plan:

## 2018-01-09 NOTE — Assessment & Plan Note (Signed)
Given excessive daytime somnolence, narrow pharyngeal exam, witnessed apneas & loud snoring, obstructive sleep apnea is very likely & an overnight polysomnogram will be scheduled as a home study. The pathophysiology of obstructive sleep apnea , it's cardiovascular consequences & modes of treatment including CPAP were discused with the patient in detail & they evidenced understanding.  Pretest probability is intermediate.  He is a mouth breather

## 2018-01-09 NOTE — Assessment & Plan Note (Signed)
Chest xray today.

## 2018-01-09 NOTE — Patient Instructions (Signed)
Home sleep study. Chest x-ray today

## 2018-01-10 ENCOUNTER — Other Ambulatory Visit: Payer: Self-pay

## 2018-01-10 ENCOUNTER — Other Ambulatory Visit: Payer: Self-pay | Admitting: Family Medicine

## 2018-01-10 ENCOUNTER — Other Ambulatory Visit (INDEPENDENT_AMBULATORY_CARE_PROVIDER_SITE_OTHER): Payer: BLUE CROSS/BLUE SHIELD

## 2018-01-10 DIAGNOSIS — Z8639 Personal history of other endocrine, nutritional and metabolic disease: Secondary | ICD-10-CM

## 2018-01-10 DIAGNOSIS — R911 Solitary pulmonary nodule: Secondary | ICD-10-CM

## 2018-01-10 DIAGNOSIS — R6882 Decreased libido: Secondary | ICD-10-CM

## 2018-01-10 LAB — LUTEINIZING HORMONE: LH: 5.29 m[IU]/mL (ref 1.50–9.30)

## 2018-01-12 ENCOUNTER — Other Ambulatory Visit (INDEPENDENT_AMBULATORY_CARE_PROVIDER_SITE_OTHER): Payer: BLUE CROSS/BLUE SHIELD

## 2018-01-12 DIAGNOSIS — R911 Solitary pulmonary nodule: Secondary | ICD-10-CM | POA: Diagnosis not present

## 2018-01-12 LAB — BASIC METABOLIC PANEL
BUN: 11 mg/dL (ref 6–23)
CALCIUM: 9.7 mg/dL (ref 8.4–10.5)
CO2: 29 mEq/L (ref 19–32)
Chloride: 100 mEq/L (ref 96–112)
Creatinine, Ser: 0.97 mg/dL (ref 0.40–1.50)
GFR: 83.2 mL/min (ref >60.00)
Glucose, Bld: 223 mg/dL — ABNORMAL HIGH (ref 70–99)
Potassium: 3.5 mEq/L (ref 3.5–5.1)
SODIUM: 137 meq/L (ref 135–145)

## 2018-01-13 LAB — TESTOSTERONE, FREE, TOTAL, SHBG
Sex Hormone Binding: 36.8 nmol/L (ref 19.3–76.4)
TESTOSTERONE FREE: 11.6 pg/mL (ref 6.6–18.1)
TESTOSTERONE: 318 ng/dL (ref 264–916)

## 2018-01-13 LAB — PROLACTIN: PROLACTIN: 14.8 ng/mL (ref 4.0–15.2)

## 2018-01-23 ENCOUNTER — Ambulatory Visit (INDEPENDENT_AMBULATORY_CARE_PROVIDER_SITE_OTHER)
Admission: RE | Admit: 2018-01-23 | Discharge: 2018-01-23 | Disposition: A | Discharge status: Home / Self Care | Payer: BLUE CROSS/BLUE SHIELD | Source: Ambulatory Visit | Attending: Pulmonary Disease | Admitting: Pulmonary Disease

## 2018-01-23 DIAGNOSIS — R911 Solitary pulmonary nodule: Secondary | ICD-10-CM | POA: Diagnosis not present

## 2018-01-23 DIAGNOSIS — R918 Other nonspecific abnormal finding of lung field: Secondary | ICD-10-CM | POA: Diagnosis not present

## 2018-01-23 MED ORDER — IOPAMIDOL (ISOVUE-300) INJECTION 61%
80.0000 mL | Freq: Once | INTRAVENOUS | Status: AC | PRN
  Administered 2018-01-23: 80 mL via INTRAVENOUS

## 2018-01-26 ENCOUNTER — Other Ambulatory Visit: Payer: Self-pay | Admitting: Pulmonary Disease

## 2018-01-26 DIAGNOSIS — R911 Solitary pulmonary nodule: Secondary | ICD-10-CM

## 2018-02-01 DIAGNOSIS — G4733 Obstructive sleep apnea (adult) (pediatric): Secondary | ICD-10-CM | POA: Diagnosis not present

## 2018-02-03 ENCOUNTER — Telehealth: Payer: Self-pay | Admitting: Pulmonary Disease

## 2018-02-03 DIAGNOSIS — G4733 Obstructive sleep apnea (adult) (pediatric): Secondary | ICD-10-CM | POA: Diagnosis not present

## 2018-02-03 NOTE — Telephone Encounter (Signed)
Per RA, HST showed mild OSA with 11 events per hour. Events are increased when sleeping in the supine position (16 events per hour then). Patient will need OV to discuss or trial of autocpap.

## 2018-02-06 ENCOUNTER — Other Ambulatory Visit: Payer: Self-pay | Admitting: *Deleted

## 2018-02-06 DIAGNOSIS — G4733 Obstructive sleep apnea (adult) (pediatric): Secondary | ICD-10-CM

## 2018-02-07 NOTE — Telephone Encounter (Signed)
Spoke with patient. He is aware of results. He stated that he is very busy this week and wishes for me to call him back next week to get him scheduled for an OV. Advised patient that I would place a reminder to call him back to get him scheduled.   Nothing else needed at time of call.

## 2018-02-10 ENCOUNTER — Encounter: Payer: Self-pay | Admitting: Family Medicine

## 2018-02-14 ENCOUNTER — Telehealth: Payer: Self-pay | Admitting: Pulmonary Disease

## 2018-02-14 NOTE — Telephone Encounter (Signed)
Spoke with patient. Explained to him that [facility] no longer has openings for tomorrow. Patient is ok with seeing a NP. Friday, July 12th would work perfect for him.   RA, please advise if you are ok with him seeing a NP for his HST results. Thanks!

## 2018-02-14 NOTE — Telephone Encounter (Signed)
Patient returning call, CB is [phone]. Patient is asking for someone to please call him back before 5:30 pm today and he will try to be available to take the call.

## 2018-02-14 NOTE — Telephone Encounter (Signed)
[facility] does not have openings on 02/15/18.  Attempted to call pt. I did not receive an answer. I have left a message for pt to return our call.

## 2018-02-14 NOTE — Telephone Encounter (Signed)
Pt is calling back [phone]

## 2018-02-14 NOTE — Telephone Encounter (Signed)
RA has some openings tomorrow, 7/10 at 3:30 and 3:45.  Attempted to call pt on both number provided as well as mobile number to see if he could come in tomorrow for an appt at either time slot but unable to reach pt.  Left a detailed message for pt to return call so that way we can see if he is able to come in tomorrow at one of the time slots with [facility].   Have held both time slots until pt returns call so that way we can see if he is able to come in.

## 2018-02-15 NOTE — Telephone Encounter (Signed)
Patient called back Spoke with patient, appt scheduled with Aaron Edelman NP for tomorrow 02/16/18 @ 1645 Nothing further needed; will sign off

## 2018-02-15 NOTE — Telephone Encounter (Signed)
OK with me  HST showed mild OSA with 11 events per hour. Events are increased when sleeping in the supine position (16 events per hour then). Patient will need OV to discuss or trial of autocpap.

## 2018-02-16 ENCOUNTER — Ambulatory Visit (INDEPENDENT_AMBULATORY_CARE_PROVIDER_SITE_OTHER): Payer: BLUE CROSS/BLUE SHIELD | Admitting: Pulmonary Disease

## 2018-02-16 ENCOUNTER — Encounter: Payer: Self-pay | Admitting: Pulmonary Disease

## 2018-02-16 DIAGNOSIS — J4540 Moderate persistent asthma, uncomplicated: Secondary | ICD-10-CM | POA: Diagnosis not present

## 2018-02-16 DIAGNOSIS — G4733 Obstructive sleep apnea (adult) (pediatric): Secondary | ICD-10-CM

## 2018-02-16 NOTE — Assessment & Plan Note (Signed)
-   Continue Symbicort  

## 2018-02-16 NOTE — Progress Notes (Signed)
@Patient  ID: Larry Garner, male    DOB: 1956/07/21, 62 y.o.   MRN: [id number]  Chief Complaint  Patient presents with  . Follow-up    Cpap results and mask options.     Referring provider: Marin Olp, MD  HPI: 62 year old male patient of Dr. Elsworth Garner.  Patient found to have moderate obstructive sleep apnea with an AHI of 11.  Patient reports he does have a diagnosis of asthma and takes Symbicort as needed to manage.    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  He feels that he does not sleep well through the night. Epworth sleepiness score is 9 and he reports sleepiness while watching TV, as a passenger in a car or lying down to rest in the afternoon. He reports occasional week and naps for about 30 minutes which are refreshing. Bedtime is around 10:30 PM, sleep latency is minimal, he sleeps on his right side with one pillow and often turns over in his stomach, reports 1-2 nocturnal awakenings.  Very often he will wake up around 2 AM and then take a tablet of some out and then fall asleep again, after going to the downstairs bedroom when he turns on the TV and in the air. He wakes up around 7 AM feeling tired occasionally with dryness of mouth. He has lost about 30 pounds in the last 2 years There is no history suggestive of cataplexy, sleep paralysis or parasomnias He carries a diagnosis of persistent asthma triggered by allergies but admits to not taking his Symbicort except on an as-needed basis.  He is almost never used his albuterol/rescue inhaler. 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 Elsworth Garner presenting today to  discuss results of home sleep study.  Home sleep study showed mild OSA with 11 events per hour.  Events were increased when patient was sleeping supine position (16 events per hour then).  With patient presenting to your options such as CPAP versus oral appliance versus no treatment.  Patient is concerned that he may be claustrophobic with mask for CPAP.  Patient wants to discuss follow-up treatment with CPAP.   Allergies  Allergen Reactions  . Morphine Sulfate Nausea And Vomiting and Swelling    Immunization History  Administered Date(s) Administered  . Influenza Split 05/12/2011, 04/25/2012  . Influenza Whole 05/17/2007, 05/10/2008, 06/06/2009, 06/10/2010  . Influenza,inj,Quad PF,6+ Mos 05/16/2013, 05/27/2014, 05/29/2015, 05/11/2016, 05/09/2017  . Pneumococcal Conjugate-13 08/14/2014  . Pneumococcal Polysaccharide-23 11/05/2015  . Td 08/09/2002  . Tdap 10/20/2012  . Zoster 07/18/2015    Past Medical History:  Diagnosis Date  . Allergic rhinitis   . Anxiety   . Asthma   . Diabetes ([place])   . Diverticulitis   . History of heart attack   . HLD (hyperlipidemia)   . HTN (hypertension)   . Tubular adenoma of colon 09/2008    Tobacco History: Social History   Tobacco Use  Smoking Status Former Smoker  . Packs/day: 0.50  . Years: 4.00  . Pack years: 2.00  . Types: Cigarettes  . Last attempt to quit: 09/29/1977  . Years since quitting: 56.4  Smokeless Tobacco Never Used  Tobacco Comment   quit on 1980   Counseling given: Not Answered Comment: quit on 1980 Continue not smoking.  Outpatient Encounter Medications as of 02/16/2018  Medication Sig  . albuterol (PROVENTIL HFA;VENTOLIN HFA) 108 (90 Base) MCG/ACT inhaler Inhale 2 puffs into the lungs every 6 (six) hours as needed for wheezing or shortness of breath.  . ALPRAZolam (XANAX) 0.5 MG tablet TAKE 1 TABLET (0.5 MG TOTAL) BY MOUTH EVERY 6 (SIX) HOURS AS NEEDED.  Marland Kitchen amLODipine (NORVASC) 5 MG tablet TAKE 1 TABLET DAILY  .  aspirin EC 81 MG tablet Take 1 tablet (81 mg total) by mouth daily.  . Azelastine-Fluticasone (DYMISTA) 137-50 MCG/ACT SUSP 1 spray each nostril bid (Patient taking differently: Place 1 spray into both nostrils daily as needed (congestion). )  . bisoprolol-hydrochlorothiazide (ZIAC) 10-6.25 MG tablet TAKE 1 TABLET DAILY  . famciclovir (FAMVIR) 500 MG tablet TAKE 3 TABLETS BY MOUTH EVERY DAY AS NEEDED AT FIRST SIGN OF OUTBREAK OF FEVER BLISTER  . folic acid (FOLVITE) 106 MCG tablet Take 1,600 mcg by mouth daily.    Marland Kitchen glimepiride (AMARYL) 2 MG tablet TAKE 1 TABLET DAILY BEFORE BREAKFAST  . glucose blood (BAYER CONTOUR NEXT TEST) test strip Use to test blood sugars daily. Dx: E11.9  . glucose blood (ONETOUCH VERIO) test strip Use to test blood sugars daily.  . INVOKAMET [phone] MG TABS TAKE 1 TABLET TWICE A DAY  . losartan (COZAAR) 100 MG tablet TAKE 1 TABLET DAILY  . Multiple Vitamin (MULTIVITAMIN) tablet Take 1 tablet by mouth daily.    . nitroGLYCERIN (NITROLINGUAL) 0.4 MG/SPRAY spray USE AS DIRECTED  . nitroGLYCERIN (NITROSTAT) 0.4 MG SL tablet Place 1 tablet (0.4 mg total) under the tongue every 5 (five) minutes as needed for chest pain.  Marland Kitchen omega-3 acid ethyl esters (LOVAZA) 1 g capsule TAKE 1 CAPSULE THREE TIMES A DAY  . oxymetazoline (AFRIN) 0.05 % nasal spray Place 1 spray into both nostrils 2 (two) times daily as needed for congestion.  . rosuvastatin (CRESTOR) 20 MG tablet TAKE 1 TABLET DAILY  . sitaGLIPtin (JANUVIA) 100 MG tablet Take 1 tablet (100 mg total) by mouth daily.  . SYMBICORT 160-4.5 MCG/ACT inhaler INHALE 2 PUFFS INTO THE LUNGS TWICE A DAY (Patient taking differently: INHALE 2 PUFFS INTO THE LUNGS TWICE A DAY AS NEEDED FOR SHORTNESS OF BREATH)  . zaleplon (SONATA) 10 MG capsule Take 1 capsule (10 mg total) by mouth at bedtime as needed. for sleep   No facility-administered encounter medications on file as of 02/16/2018.      Review of Systems  Constitutional:   No   weight loss, night sweats,  fevers, chills, fatigue, or  lassitude HEENT:   No headaches,  Difficulty swallowing,  Tooth/dental problems, or  Sore throat, No sneezing, itching, ear ache, nasal congestion, post nasal drip  CV: No chest pain,  orthopnea, PND, swelling in lower extremities, anasarca, dizziness, palpitations, syncope  GI: No heartburn, indigestion, abdominal pain, nausea, vomiting, diarrhea, change in bowel habits, loss of appetite, bloody stools Resp: No shortness of breath with exertion or at rest.  No excess mucus, no productive cough,  No non-productive cough,  No coughing up of blood.  No change in color of mucus.  No wheezing.  No chest wall deformity Skin: no rash, lesions, no skin changes. GU: no dysuria, change in color of urine, no urgency or frequency.  No flank pain, no hematuria  MS:  No joint pain or swelling.  No decreased range of motion.  No back pain. Psych:  No change in mood or affect. No depression or anxiety.  No memory loss.   Physical Exam  BP 134/80   Pulse 74   Ht 5\' 11"  (1.803 m)   Wt 215 lb (97.5 kg)   SpO2 97%   BMI 29.99 kg/m   Wt Readings from Last 5 Encounters:  02/16/18 215 lb (97.5 kg)  01/09/18 218 lb (98.9 kg)  12/28/17 217 lb 9.6 oz (98.7 kg)  11/14/17 215 lb (97.5 kg)  07/15/17 218 lb 3.2 oz (99 kg)     Physical Exam  Constitutional: He is oriented to person, place, and time and well-developed, well-nourished, and in no distress. No distress.  HENT:  Head: Normocephalic and atraumatic.  Right Ear: Hearing, tympanic membrane, external ear and ear canal normal.  Left Ear: Hearing, tympanic membrane, external ear and ear canal normal.  Nose: Nose normal. Right sinus exhibits no maxillary sinus tenderness and no frontal sinus tenderness. Left sinus exhibits no maxillary sinus tenderness and no frontal sinus tenderness.  Mouth/Throat: Uvula is midline and oropharynx is clear and moist. No oropharyngeal exudate.  Mallampati 3  Eyes:  Pupils are equal, round, and reactive to light.  Neck: Normal range of motion. Neck supple. No JVD present.  Cardiovascular: Normal rate, regular rhythm and normal heart sounds.  Pulmonary/Chest: Effort normal and breath sounds normal. No accessory muscle usage. No respiratory distress. He has no decreased breath sounds. He has no wheezes. He has no rhonchi.  Musculoskeletal: Normal range of motion. He exhibits no edema.  Lymphadenopathy:    He has no cervical adenopathy.  Neurological: He is alert and oriented to person, place, and time. Gait normal.  Skin: Skin is warm and dry. He is not diaphoretic. No erythema.  Psychiatric: Mood, memory, affect and judgment normal.  Nursing note and vitals reviewed.    Lab Results:  CBC    Component Value Date/Time   WBC 8.3 11/14/2017 0911   RBC 5.20 11/14/2017 0911   HGB 15.8 11/14/2017 0911   HCT 46.0 11/14/2017 0911   PLT 313.0 11/14/2017 0911   MCV 88.4 11/14/2017 0911   MCH 30.8 07/19/2016 0939   MCHC 34.4 11/14/2017 0911   RDW 13.1 11/14/2017 0911   LYMPHSABS 3.1 11/04/2016 0809   MONOABS 0.7 11/04/2016 0809   EOSABS 0.3 11/04/2016 0809   BASOSABS 0.1 11/04/2016 0809    BMET    Component Value Date/Time   NA 137 01/12/2018 1630   K 3.5 01/12/2018 1630   CL 100 01/12/2018 1630   CO2 29 01/12/2018 1630   GLUCOSE 223 (H) 01/12/2018 1630   BUN 11 01/12/2018 1630   CREATININE 0.97 01/12/2018 1630   CREATININE 0.88 07/19/2016 0939   CALCIUM 9.7 01/12/2018 1630   GFRNONAA >90 05/04/2014 0729   GFRAA >90 05/04/2014 0729    BNP No results found for: BNP  ProBNP No results found for: PROBNP  Imaging: Ct Chest W Contrast  Result Date: 01/23/2018 CLINICAL DATA:  Pulmonary nodules EXAM: CT CHEST WITH CONTRAST TECHNIQUE: Multidetector CT imaging of the chest was performed during intravenous contrast administration. CONTRAST:  68mL ISOVUE-300 IOPAMIDOL (ISOVUE-300) INJECTION 61% COMPARISON:  Chest CT November 17, 2016; chest  radiograph January 09, 2018; PET-CT June 30, 2015 FINDINGS: Cardiovascular: There is no appreciable thoracic aortic aneurysm or dissection. Visualized great vessels appear unremarkable. Patient is status post coronary artery bypass grafting. There is extensive native coronary artery calcification. There is no  pericardial effusion or pericardial thickening. No evident pulmonary embolus. Mediastinum/Nodes: There are subcentimeter nodular lesions in the thyroid consistent with multinodular goiter. No dominant mass evident in the thyroid. There is no appreciable thoracic adenopathy the. No esophageal lesions are evident. Lungs/Pleura: The nodular opacity in the superior segment of the left lower lobe measures 2.2 x 2.1 cm, stable from prior study. This nodular lesion has a smooth contour and appears stable from prior study. There is a tiny calcified granuloma in the left upper lobe, stable. A prior small pulmonary nodular lesion in the superior segment of the left upper lobe near the major fissure is not seen currently. A previously noted 3 mm nodular opacity in the medial segment of the right middle lobe seen on axial slice 89 series 3 is stable. There is a stable 4 mm nodular opacity in the posterior segment right lower lobe seen on axial slice 539 series 3. No new parenchymal nodular lung opacities are evident. No lung edema or consolidation evident. No pleural effusion or pleural thickening. Upper Abdomen: There is cholelithiasis gallbladder wall does not appear appreciably thickened. There is hepatic steatosis. Visualized upper abdominal structures otherwise appear unremarkable. Musculoskeletal: No blastic or lytic bone lesions are evident. No chest wall lesions are evident. Patient is status post median sternotomy. IMPRESSION: 1. 2.2 x 2.1 cm nodular opacity in the superior segment left lower lobe, stable compared to prior study from April 2018. No new nodular lesions are evident. As this lesion appears slightly  larger than on the 2016 study, it would be reasonable to obtain a noncontrast enhanced chest CT in 1 year to confirm 2 years of stability of this nodular lesion, presumptive evidence of benign etiology. 2.  No lung edema or consolidation. 3.  No evident thoracic adenopathy. 4.  Status post coronary artery bypass grafting. 5.  Cholelithiasis. 6.  Hepatic steatosis. Electronically Signed   By: Lowella Grip III M.D.   On: 01/23/2018 10:06     Assessment & Plan:   We recommend that you start CPAP trial to treat sleep apnea.  Patient declines at this time.  Patient will think about it.  Pleasant 62 year old patient seen office today.  Had extensive discussions regarding sleep study as well as AHI results.  Patient still hesitant to start CPAP therapy.  Patient would like to contact our office if he would like to resume.  Patient knows that untreated sleep apnea can worsen chronic symptoms of hypertension, diabetes, cardiovascular history, etc.  Patient reports that most likely he will start CPAP use but wants to think about it.  Patient knows to follow-up with our office as soon as he makes a decision.  Patient to follow-up with Dr. Elsworth Garner in 2 months.  OSA (obstructive sleep apnea) Please contact our office if you are having any other questions regarding your respiratory status or sleeping or starting CPAP  Continue Symbicort >>> 2 puffs in the morning right when you wake up, rinse out your mouth after use, 12 hours later 2 puffs, rinse after use >>> Take this daily, no matter what >>> This is not a rescue inhaler   Follow-up with Dr. Elsworth Garner in 2 months  Asthma, moderate persistent Continue Symbicort  This appointment was 26 minutes along with her 50% that time direct face-to-face patient care, assessment, discussion.   Lauraine Rinne, NP 02/16/2018

## 2018-02-16 NOTE — Assessment & Plan Note (Signed)
Please contact our office if you are having any other questions regarding your respiratory status or sleeping or starting CPAP  Continue Symbicort >>> 2 puffs in the morning right when you wake up, rinse out your mouth after use, 12 hours later 2 puffs, rinse after use >>> Take this daily, no matter what >>> This is not a rescue inhaler   Follow-up with Dr. Elsworth Soho in 2 months

## 2018-02-16 NOTE — Patient Instructions (Addendum)
Please contact our office if you are having any other questions regarding your respiratory status or sleeping or starting CPAP  Continue Symbicort >>> 2 puffs in the morning right when you wake up, rinse out your mouth after use, 12 hours later 2 puffs, rinse after use >>> Take this daily, no matter what >>> This is not a rescue inhaler   Follow-up with Dr. Elsworth Soho in 2 months  Please contact the office if your symptoms worsen or you have concerns that you are not improving.   Thank you for choosing [facility] for your healthcare, and for allowing [place] to partner with you on your healthcare journey. I am thankful to be able to provide care to you today.   Wyn Quaker FNP-C        CPAP and BiPAP Information CPAP and BiPAP are methods of helping a person breathe with the use of air pressure. CPAP stands for "continuous positive airway pressure." BiPAP stands for "bi-level positive airway pressure." In both methods, air is blown through your nose or mouth and into your air passages to help you breathe well. CPAP and BiPAP use different amounts of pressure to blow air. With CPAP, the amount of pressure stays the same while you breathe in and out. With BiPAP, the amount of pressure is increased when you breathe in (inhale) so that you can take larger breaths. Your health care provider will recommend whether CPAP or BiPAP would be more helpful for you. Why are CPAP and BiPAP treatments used? CPAP or BiPAP can be helpful if you have:  Sleep apnea.  Chronic obstructive pulmonary disease (COPD).  Heart failure.  Medical conditions that weaken the muscles of the chest including muscular dystrophy, or neurological diseases such as amyotrophic lateral sclerosis (ALS).  Other problems that cause breathing to be weak, abnormal, or difficult.  CPAP is most commonly used for obstructive sleep apnea (OSA) to keep the airways from collapsing when the muscles relax during sleep. How is CPAP  or BiPAP administered? Both CPAP and BiPAP are provided by a small machine with a flexible plastic tube that attaches to a plastic mask. You wear the mask. Air is blown through the mask into your nose or mouth. The amount of pressure that is used to blow the air can be adjusted on the machine. Your health care provider will determine the pressure setting that should be used based on your individual needs. When should CPAP or BiPAP be used? In most cases, the mask only needs to be worn during sleep. Generally, the mask needs to be worn throughout the night and during any daytime naps. People with certain medical conditions may also need to wear the mask at other times when they are awake. Follow instructions from your health care provider about when to use the machine. What are some tips for using the mask?  Because the mask needs to be snug, some people feel trapped or closed-in (claustrophobic) when first using the mask. If you feel this way, you may need to get used to the mask. One way to do this is by holding the mask loosely over your nose or mouth and then gradually applying the mask more snugly. You can also gradually increase the amount of time that you use the mask.  Masks are available in various types and sizes. Some fit over your mouth and nose while others fit over just your nose. If your mask does not fit well, talk with your health care provider about  getting a different one.  If you are using a mask that fits over your nose and you tend to breathe through your mouth, a chin strap may be applied to help keep your mouth closed.  The CPAP and BiPAP machines have alarms that may sound if the mask comes off or develops a leak.  If you have trouble with the mask, it is very important that you talk with your health care provider about finding a way to make the mask easier to tolerate. Do not stop using the mask. Stopping the use of the mask could have a negative impact on your health. What  are some tips for using the machine?  Place your CPAP or BiPAP machine on a secure table or stand near an electrical outlet.  Know where the on/off switch is located on the machine.  Follow instructions from your health care provider about how to set the pressure on your machine and when you should use it.  Do not eat or drink while the CPAP or BiPAP machine is on. Food or fluids could get pushed into your lungs by the pressure of the CPAP or BiPAP.  Do not smoke. Tobacco smoke residue can damage the machine.  For home use, CPAP and BiPAP machines can be rented or purchased through home health care companies. Many different brands of machines are available. Renting a machine before purchasing may help you find out which particular machine works well for you.  Keep the CPAP or BiPAP machine and attachments clean. Ask your health care provider for specific instructions. Get help right away if:  You have redness or open areas around your nose or mouth where the mask fits.  You have trouble using the CPAP or BiPAP machine.  You cannot tolerate wearing the CPAP or BiPAP mask.  You have pain, discomfort, and bloating in your abdomen. Summary  CPAP and BiPAP are methods of helping a person breathe with the use of air pressure.  Both CPAP and BiPAP are provided by a small machine with a flexible plastic tube that attaches to a plastic mask.  If you have trouble with the mask, it is very important that you talk with your health care provider about finding a way to make the mask easier to tolerate. This information is not intended to replace advice given to you by your health care provider. Make sure you discuss any questions you have with your health care provider. Document Released: 04/23/2004 Document Revised: 06/14/2016 Document Reviewed: 06/14/2016 Elsevier Interactive Patient Education  2017 [employer].

## 2018-02-20 ENCOUNTER — Encounter: Payer: Self-pay | Admitting: Family Medicine

## 2018-02-20 ENCOUNTER — Other Ambulatory Visit: Payer: Self-pay

## 2018-02-20 ENCOUNTER — Telehealth: Payer: Self-pay | Admitting: Pulmonary Disease

## 2018-02-20 DIAGNOSIS — G4733 Obstructive sleep apnea (adult) (pediatric): Secondary | ICD-10-CM

## 2018-02-20 MED ORDER — ROSUVASTATIN CALCIUM 20 MG PO TABS: 20.0000 mg | ORAL_TABLET | Freq: Every day | ORAL | 3 refills | Status: DC

## 2018-02-20 MED ORDER — BISOPROLOL-HYDROCHLOROTHIAZIDE 10-6.25 MG PO TABS: 1.0000 | ORAL_TABLET | Freq: Every day | ORAL | 3 refills | Status: DC

## 2018-02-20 NOTE — Telephone Encounter (Signed)
Called patient, unable to reach left message to give us a call back. 

## 2018-02-21 NOTE — Telephone Encounter (Signed)
Pt is calling back [phone]

## 2018-02-21 NOTE — Telephone Encounter (Signed)
AutoCPAP 5-15 cm, mask of choice OV in 6 wks with me

## 2018-02-21 NOTE — Telephone Encounter (Signed)
Called and spoke with pt who stated he will proceed beginning CPAP.  Stated to pt we would take care of sending order for pt to begin CPAP. Pt expressed understanding.   Dr. Elsworth Soho, please advise settings for pt's CPAP. Thanks!

## 2018-02-21 NOTE — Telephone Encounter (Signed)
Attempted to call pt. I did not receive an answer. I have left a message for pt to return our call.  

## 2018-02-23 NOTE — Telephone Encounter (Signed)
Spoke with pt. He is aware that we will be placing an order for his CPAP machine. OV has already been scheduled prior to our conversation. Nothing further was needed.

## 2018-03-02 ENCOUNTER — Encounter: Payer: Self-pay | Admitting: Family Medicine

## 2018-03-03 ENCOUNTER — Other Ambulatory Visit: Payer: Self-pay

## 2018-03-03 MED ORDER — AMLODIPINE BESYLATE 5 MG PO TABS: 5.0000 mg | ORAL_TABLET | Freq: Every day | ORAL | 1 refills | Status: DC

## 2018-03-03 MED ORDER — LOSARTAN POTASSIUM 100 MG PO TABS: 100.0000 mg | ORAL_TABLET | Freq: Every day | ORAL | 3 refills | Status: DC

## 2018-03-09 ENCOUNTER — Encounter: Payer: Self-pay | Admitting: Family Medicine

## 2018-03-09 ENCOUNTER — Other Ambulatory Visit: Payer: Self-pay

## 2018-03-09 MED ORDER — GLIMEPIRIDE 2 MG PO TABS: 2.0000 mg | ORAL_TABLET | Freq: Every day | ORAL | 3 refills | Status: DC

## 2018-03-14 ENCOUNTER — Encounter: Payer: Self-pay | Admitting: Family Medicine

## 2018-03-15 ENCOUNTER — Other Ambulatory Visit: Payer: Self-pay

## 2018-03-15 MED ORDER — ALPRAZOLAM 0.5 MG PO TABS: 0.5000 mg | ORAL_TABLET | Freq: Four times a day (QID) | ORAL | 5 refills | Status: DC | PRN

## 2018-03-16 ENCOUNTER — Ambulatory Visit: Payer: BLUE CROSS/BLUE SHIELD | Admitting: Family Medicine

## 2018-03-27 ENCOUNTER — Ambulatory Visit: Payer: BLUE CROSS/BLUE SHIELD | Admitting: Family Medicine

## 2018-04-11 ENCOUNTER — Encounter: Payer: Self-pay | Admitting: Family Medicine

## 2018-04-11 ENCOUNTER — Ambulatory Visit (INDEPENDENT_AMBULATORY_CARE_PROVIDER_SITE_OTHER): Payer: BLUE CROSS/BLUE SHIELD | Admitting: Family Medicine

## 2018-04-11 VITALS — BP 120/60 | HR 60 | Temp 98.5°F | Ht 71.0 in | Wt 215.2 lb

## 2018-04-11 DIAGNOSIS — E119 Type 2 diabetes mellitus without complications: Secondary | ICD-10-CM | POA: Diagnosis not present

## 2018-04-11 DIAGNOSIS — I10 Essential (primary) hypertension: Secondary | ICD-10-CM

## 2018-04-11 DIAGNOSIS — E785 Hyperlipidemia, unspecified: Secondary | ICD-10-CM | POA: Diagnosis not present

## 2018-04-11 DIAGNOSIS — Z1283 Encounter for screening for malignant neoplasm of skin: Secondary | ICD-10-CM

## 2018-04-11 LAB — POCT GLYCOSYLATED HEMOGLOBIN (HGB A1C): Hemoglobin A1C: 6.9 % — AB (ref 4.0–5.6)

## 2018-04-11 NOTE — Patient Instructions (Addendum)
No changes today  Congrats! On the excellent a1c Lab Results  Component Value Date   HGBA1C 6.9 (A) 04/11/2018   Keep up the good work and we can check in 4 months from now.   Im on the side of CPAP trial if cost effective  We will call you within two weeks about your referral to [facility]. If you do not hear within 3 weeks, give [place] a call.   Flu shot sometime after the wedding- call for nurse visit

## 2018-04-11 NOTE — Assessment & Plan Note (Signed)
S: well controlled on crestor and lovaza with trig under 200 in April and LDL under 70.  A/P:  continue current rx

## 2018-04-11 NOTE — Assessment & Plan Note (Signed)
S:  controlled on  invokamet 15-1000mg  BID, januvia 100mg  CBGs- 113- 180 Exercise and diet- staying active, drinking lots of water Lab Results  Component Value Date   HGBA1C 6.9 (A) 04/11/2018   HGBA1C 8.4 (H) 11/14/2017   HGBA1C 7.3 07/20/2017  A/P: we discussed continuing current meds. If a1c curve bends back up could consider stopping [place] and trialing ozempic or trulicity

## 2018-04-11 NOTE — Progress Notes (Signed)
Subjective:  Larry Garner is a 62 y.o. year old very pleasant male patient who presents for/with See problem oriented charting ROS- No chest pain or shortness of breath (except with asthma and albuterol resolves). No headache or blurry vision.    Past Medical History-  Patient Active Problem List   Diagnosis Date Noted  . Well controlled type 2 diabetes mellitus ([place]) 03/31/2010    Priority: High  . Coronary artery disease involving native coronary artery of native heart without angina pectoris 04/06/2007    Priority: High  . Insomnia 05/27/2014    Priority: Medium  . Asthma, moderate persistent 06/19/2008    Priority: Medium  . ERECTILE DYSFUNCTION, SECONDARY TO MEDICATION 05/17/2007    Priority: Medium  . Hyperlipidemia 04/06/2007    Priority: Medium  . Anxiety state 04/06/2007    Priority: Medium  . Essential hypertension 04/06/2007    Priority: Medium  . Former smoker 11/04/2014    Priority: Low  . Plantar wart of left foot 11/27/2010    Priority: Low  . PSA, INCREASED 09/15/2009    Priority: Low  . Diverticulitis of colon 05/08/2009    Priority: Low  . ANKLE PAIN, CHRONIC 06/05/2007    Priority: Low  . Allergic rhinitis 05/17/2007    Priority: Low  . OSA (obstructive sleep apnea) 01/09/2018  . Hamartoma ([place]) 03/15/2017  . Abnormal nuclear stress test 07/20/2016    Medications- reviewed and updated Current Outpatient Medications  Medication Sig Dispense Refill  . albuterol (PROVENTIL HFA;VENTOLIN HFA) 108 (90 Base) MCG/ACT inhaler Inhale 2 puffs into the lungs every 6 (six) hours as needed for wheezing or shortness of breath. 1 Inhaler 5  . ALPRAZolam (XANAX) 0.5 MG tablet Take 1 tablet (0.5 mg total) by mouth every 6 (six) hours as needed. 30 tablet 5  . amLODipine (NORVASC) 5 MG tablet Take 1 tablet (5 mg total) by mouth daily. 90 tablet 1  . aspirin EC 81 MG tablet Take 1 tablet (81 mg total) by mouth daily. 90 tablet 3  . Azelastine-Fluticasone (DYMISTA)  137-50 MCG/ACT SUSP 1 spray each nostril bid (Patient taking differently: Place 1 spray into both nostrils daily as needed (congestion). ) 1 Bottle 5  . bisoprolol-hydrochlorothiazide (ZIAC) 10-6.25 MG tablet Take 1 tablet by mouth daily. 90 tablet 3  . famciclovir (FAMVIR) 500 MG tablet TAKE 3 TABLETS BY MOUTH EVERY DAY AS NEEDED AT FIRST SIGN OF OUTBREAK OF FEVER BLISTER 15 tablet 1  . folic acid (FOLVITE) 161 MCG tablet Take 1,600 mcg by mouth daily.      Marland Kitchen glimepiride (AMARYL) 2 MG tablet Take 1 tablet (2 mg total) by mouth daily before breakfast. 90 tablet 3  . glucose blood (BAYER CONTOUR NEXT TEST) test strip Use to test blood sugars daily. Dx: E11.9 100 each 12  . glucose blood (ONETOUCH VERIO) test strip Use to test blood sugars daily. 100 each 11  . INVOKAMET [phone] MG TABS TAKE 1 TABLET TWICE A DAY 180 tablet 1  . losartan (COZAAR) 100 MG tablet Take 1 tablet (100 mg total) by mouth daily. 90 tablet 3  . Multiple Vitamin (MULTIVITAMIN) tablet Take 1 tablet by mouth daily.      . nitroGLYCERIN (NITROLINGUAL) 0.4 MG/SPRAY spray USE AS DIRECTED 30 g 2  . nitroGLYCERIN (NITROSTAT) 0.4 MG SL tablet Place 1 tablet (0.4 mg total) under the tongue every 5 (five) minutes as needed for chest pain. 30 tablet 0  . omega-3 acid ethyl esters (LOVAZA) 1 g capsule  TAKE 1 CAPSULE THREE TIMES A DAY 90 capsule 3  . oxymetazoline (AFRIN) 0.05 % nasal spray Place 1 spray into both nostrils 2 (two) times daily as needed for congestion.    . rosuvastatin (CRESTOR) 20 MG tablet Take 1 tablet (20 mg total) by mouth daily. 90 tablet 3  . sitaGLIPtin (JANUVIA) 100 MG tablet Take 1 tablet (100 mg total) by mouth daily. 90 tablet 1  . SYMBICORT 160-4.5 MCG/ACT inhaler INHALE 2 PUFFS INTO THE LUNGS TWICE A DAY (Patient taking differently: INHALE 2 PUFFS INTO THE LUNGS TWICE A DAY AS NEEDED FOR SHORTNESS OF BREATH) 10.2 Inhaler 1  . zaleplon (SONATA) 10 MG capsule Take 1 capsule (10 mg total) by mouth at bedtime as  needed. for sleep 30 capsule 5   No current facility-administered medications for this visit.     Objective: BP 120/60 (BP Location: Left Arm, Patient Position: Sitting, Cuff Size: Large)   Pulse 60   Temp 98.5 F (36.9 C) (Oral)   Ht 5\' 11"  (1.803 m)   Wt 215 lb 4 oz (97.6 kg)   SpO2 96%   BMI 30.02 kg/m  Gen: NAD, resting comfortably CV: RRR no murmurs rubs or gallops Lungs: CTAB no crackles, wheeze, rhonchi Abdomen: soft/nontender/nondistended/normal bowel sounds.  Ext: no edema Skin: warm, dry  Assessment/Plan:  Other notes: 1. still not sure if he wants to trial cpap. Still waking up after 2-3 hours.  2. Likely seborrheic keratosis next to right nipple- we will get derms opinion   Hypertension S: controlled on  losartan 100mg , amlodipine 5mg , ziac 10-6.25 mg BP Readings from Last 3 Encounters:  04/11/18 120/60  02/16/18 134/80  01/09/18 122/78  A/P: We discussed blood pressure goal of <140/90. Continue current meds  Well controlled type 2 diabetes mellitus ([place]) S:  controlled on  invokamet 15-1000mg  BID, januvia 100mg  CBGs- 113- 180 Exercise and diet- staying active, drinking lots of water Lab Results  Component Value Date   HGBA1C 6.9 (A) 04/11/2018   HGBA1C 8.4 (H) 11/14/2017   HGBA1C 7.3 07/20/2017  A/P: we discussed continuing current meds. If a1c curve bends back up could consider stopping [place] and trialing ozempic or trulicity   Hyperlipidemia S: well controlled on crestor and lovaza with trig under 200 in April and LDL under 70.  A/P:  continue current rx   Future Appointments  Date Time Provider Finger  05/09/2018  9:15 AM Rigoberto Noel, MD [facility] None   Return in about 4 months (around 08/11/2018).  Lab/Order associations: Well controlled type 2 diabetes mellitus ([place]) - Plan: POCT glycosylated hemoglobin (Hb A1C)  Hyperlipidemia, unspecified hyperlipidemia type  Essential hypertension  Skin cancer screening - Plan:  Ambulatory referral to Dermatology  Return precautions advised.  [name], MD

## 2018-04-20 DIAGNOSIS — E119 Type 2 diabetes mellitus without complications: Secondary | ICD-10-CM | POA: Diagnosis not present

## 2018-04-20 LAB — HM DIABETES EYE EXAM

## 2018-04-27 ENCOUNTER — Encounter: Payer: Self-pay | Admitting: Family Medicine

## 2018-05-05 ENCOUNTER — Ambulatory Visit: Payer: BLUE CROSS/BLUE SHIELD | Admitting: Pulmonary Disease

## 2018-05-09 ENCOUNTER — Ambulatory Visit (INDEPENDENT_AMBULATORY_CARE_PROVIDER_SITE_OTHER): Payer: BLUE CROSS/BLUE SHIELD | Admitting: Pulmonary Disease

## 2018-05-09 ENCOUNTER — Encounter: Payer: Self-pay | Admitting: Pulmonary Disease

## 2018-05-09 DIAGNOSIS — G4733 Obstructive sleep apnea (adult) (pediatric): Secondary | ICD-10-CM | POA: Diagnosis not present

## 2018-05-09 DIAGNOSIS — Z23 Encounter for immunization: Secondary | ICD-10-CM | POA: Diagnosis not present

## 2018-05-09 DIAGNOSIS — R911 Solitary pulmonary nodule: Secondary | ICD-10-CM | POA: Diagnosis not present

## 2018-05-09 DIAGNOSIS — Q859 Phakomatosis, unspecified: Secondary | ICD-10-CM

## 2018-05-09 NOTE — Addendum Note (Signed)
Addended by: Vivia Ewing on: 05/09/2018 10:07 AM   Modules accepted: Orders

## 2018-05-09 NOTE — Assessment & Plan Note (Signed)
Repeat CT chest with contrast in June 2020

## 2018-05-09 NOTE — Addendum Note (Signed)
Addended by: Vivia Ewing on: 05/09/2018 11:11 AM   Modules accepted: Orders

## 2018-05-09 NOTE — Assessment & Plan Note (Addendum)
Prescription for auto CPAP 5 to 12 cm with nasal mask will be sent to DME, okay to start this after you return in November.  We discussed implications of mild obstructive sleep apnea.  Avoid sleeping in supine position We discussed alternative treatments including dental appliance

## 2018-05-09 NOTE — Patient Instructions (Signed)
Prescription for auto CPAP 5 to 12 cm with nasal mask will be sent to DME, okay to start this after you return in November.  We discussed implications of mild obstructive sleep apnea. Repeat CT chest with contrast in June 2020

## 2018-05-09 NOTE — Progress Notes (Signed)
   Subjective:    Patient ID: Larry Garner, male    DOB: Sep 15, 1955, 62 y.o.   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  He almost never used his albuterol/rescue inhaler. He had a left lower lobe nodule first noted in 2016  About 19 mmthat was not hypermetabolic on PET scan and clarified on follow-up to be a hemartoma in 2018, slight increase in size to 22 mm and stable in 2019  Home sleep study showed mild OSA especially in the supine position and early morning probably REM sleep.  We discussed in detail.  He was concerned about cardiovascular implications.  He remains sleepy somewhat tired during the day cannot drive for long hours.  Wife and family have noted loud snoring.  Significant tests/ events reviewed  HST 01/2018 AHI 11/h, worse in supine  CT chest w contrast 01/2018 2.2 cm LLL nodule, stable compared to 11/2016 Review of Systems neg for any significant sore throat, dysphagia, itching, sneezing, nasal congestion or excess/ purulent secretions, fever, chills, sweats, unintended wt loss, pleuritic or exertional cp, hempoptysis, orthopnea pnd or change in chronic leg swelling. Also denies presyncope, palpitations, heartburn, abdominal pain, nausea, vomiting, diarrhea or change in bowel or urinary habits, dysuria,hematuria, rash, arthralgias, visual complaints, headache, numbness weakness or ataxia.     Objective:   Physical Exam   Gen. Pleasant, well-nourished, in no distress ENT - no thrush, no post nasal drip Neck: No JVD, no thyromegaly, no carotid bruits Lungs: no use of accessory muscles, no dullness to percussion, clear without rales or rhonchi  Cardiovascular: Rhythm regular, heart sounds  normal, no murmurs or gallops, no peripheral edema Musculoskeletal: No deformities, no cyanosis or clubbing         Assessment & Plan:

## 2018-05-15 ENCOUNTER — Other Ambulatory Visit: Payer: Self-pay | Admitting: Family Medicine

## 2018-05-15 ENCOUNTER — Encounter: Payer: Self-pay | Admitting: Family Medicine

## 2018-06-20 ENCOUNTER — Telehealth: Payer: Self-pay | Admitting: Pulmonary Disease

## 2018-06-20 DIAGNOSIS — G4733 Obstructive sleep apnea (adult) (pediatric): Secondary | ICD-10-CM

## 2018-06-20 NOTE — Telephone Encounter (Signed)
Spoke with [facility] and they stated the order was cancelled because pt's hadn't reached closer to his deductible so he wanted to hold off. I called pt but he agreed to go ahead and get the CPAP started. I advised him that I would resend the order. Pt agreed and nothing further is needed.

## 2018-06-22 ENCOUNTER — Other Ambulatory Visit: Payer: Self-pay | Admitting: Family Medicine

## 2018-06-22 ENCOUNTER — Encounter: Payer: Self-pay | Admitting: Family Medicine

## 2018-06-27 DIAGNOSIS — G4733 Obstructive sleep apnea (adult) (pediatric): Secondary | ICD-10-CM | POA: Diagnosis not present

## 2018-07-10 NOTE — Progress Notes (Signed)
Cardiology Office Note   Date:  07/12/2018   ID:  Larry Garner, Larry Garner 03/29/1956, MRN [id number]  PCP:  Marin Olp, MD  Cardiologist:   Jenkins Rouge, MD   No chief complaint on file.     History of Present Illness: Larry Garner is a 62 y.o. male f/u  CAD/CABG Had previous stent to LAD with restenosis and subsequent CABG  CABG [place]:06/19/1999  LIMA LAD,  SVG D1 SVG OM1  He is still banking. Knows Ignatius Specking well. No chest pain. Previously mostly had Dyspnea before bypass. Wife retired from [employer]. Two children daughter in [place] son moved to [facility] area and married this year  Lots of stress working at [employer]. Hopes to retire At max social security age [age]  No angina Nitro is less than a year old   Cath Dr Tamala Julian 07/20/16 reviewed Grafts patient newly diagnosed native RCA occlusion with collaterals   Widely patent previously placed bypass grafts including LIMA to mid LAD, saphenous graft to diagonal, and saphenous vein graft to obtuse marginal #1/ramus intermedius.  Total occlusion of the proximal LAD in stent.  Total occlusion of the obtuse marginal #1/ramus intermedius  60-70% stenosis in the native first diagonal  Total occlusion of the native mid right coronary collateralized by both right to right and left-to-right collaterals. The right coronary is dominant.  Normal left ventricular hemodynamics. LVEF 45-50 %.  Recommendations:   Continue aggressive risk factor modification.  In absence of cardiac symptoms, medical therapy seems most appropriate. Therefore, CTO recanalization will not be pursued..   Past Medical History:  Diagnosis Date  . Allergic rhinitis   . Anxiety   . Asthma   . Diabetes ([place])   . Diverticulitis   . History of heart attack   . HLD (hyperlipidemia)   . HTN (hypertension)   . Tubular adenoma of colon 09/2008    Past Surgical History:  Procedure Laterality Date  . CARDIAC  CATHETERIZATION N/A 07/20/2016   Procedure: Left Heart Cath and Cors/Grafts Angiography;  Surgeon: Belva Crome, MD;  Location: [facility];  Service: Cardiovascular;  Laterality: N/A;  . CORONARY ANGIOPLASTY    . CORONARY ARTERY BYPASS GRAFT  06/1999     Current Outpatient Medications  Medication Sig Dispense Refill  . albuterol (PROVENTIL HFA;VENTOLIN HFA) 108 (90 Base) MCG/ACT inhaler Inhale 2 puffs into the lungs every 6 (six) hours as needed for wheezing or shortness of breath. 1 Inhaler 5  . ALPRAZolam (XANAX) 0.5 MG tablet Take 1 tablet (0.5 mg total) by mouth every 6 (six) hours as needed. 30 tablet 5  . amLODipine (NORVASC) 5 MG tablet Take 1 tablet (5 mg total) by mouth daily. 90 tablet 1  . aspirin EC 81 MG tablet Take 1 tablet (81 mg total) by mouth daily. 90 tablet 3  . Azelastine-Fluticasone (DYMISTA) 137-50 MCG/ACT SUSP 1 spray each nostril bid (Patient taking differently: Place 1 spray into both nostrils daily as needed (congestion). ) 1 Bottle 5  . bisoprolol-hydrochlorothiazide (ZIAC) 10-6.25 MG tablet Take 1 tablet by mouth daily. 90 tablet 3  . famciclovir (FAMVIR) 500 MG tablet TAKE 3 TABLETS BY MOUTH EVERY DAY AS NEEDED AT FIRST SIGN OF OUTBREAK OF FEVER BLISTER 15 tablet 1  . folic acid (FOLVITE) 222 MCG tablet Take 1,600 mcg by mouth daily.      Marland Kitchen glimepiride (AMARYL) 2 MG tablet Take 1 tablet (2 mg total) by mouth daily before breakfast.  90 tablet 3  . glucose blood (BAYER CONTOUR NEXT TEST) test strip Use to test blood sugars daily. Dx: E11.9 100 each 12  . glucose blood (ONETOUCH VERIO) test strip Use to test blood sugars daily. 100 each 11  . INVOKAMET [phone] MG TABS TAKE 1 TABLET TWICE A DAY 180 tablet 1  . losartan (COZAAR) 100 MG tablet Take 1 tablet (100 mg total) by mouth daily. 90 tablet 3  . Multiple Vitamin (MULTIVITAMIN) tablet Take 1 tablet by mouth daily.      . nitroGLYCERIN (NITROLINGUAL) 0.4 MG/SPRAY spray USE AS DIRECTED 30 g 2  .  nitroGLYCERIN (NITROSTAT) 0.4 MG SL tablet Place 1 tablet (0.4 mg total) under the tongue every 5 (five) minutes as needed for chest pain. 30 tablet 0  . omega-3 acid ethyl esters (LOVAZA) 1 g capsule TAKE 1 CAPSULE THREE TIMES A DAY 90 capsule 12  . oxymetazoline (AFRIN) 0.05 % nasal spray Place 1 spray into both nostrils 2 (two) times daily as needed for congestion.    . rosuvastatin (CRESTOR) 20 MG tablet Take 1 tablet (20 mg total) by mouth daily. 90 tablet 3  . sitaGLIPtin (JANUVIA) 100 MG tablet Take 1 tablet (100 mg total) by mouth daily. 90 tablet 1  . SYMBICORT 160-4.5 MCG/ACT inhaler INHALE 2 PUFFS INTO THE LUNGS TWICE A DAY (Patient taking differently: INHALE 2 PUFFS INTO THE LUNGS TWICE A DAY AS NEEDED FOR SHORTNESS OF BREATH) 10.2 Inhaler 1  . zaleplon (SONATA) 10 MG capsule TAKE ONE CAPSULE BY MOUTH AT BEDTIME AS NEEDED FOR SLEEP 30 capsule 5   No current facility-administered medications for this visit.     Allergies:   Morphine sulfate    Social History:  The patient  reports that he quit smoking about 40 years ago. His smoking use included cigarettes. He has a 2.00 pack-year smoking history. He has never used smokeless tobacco. He reports that he drinks about 1.0 standard drinks of alcohol per week. He reports that he does not use drugs.   Family History:  The patient's family history includes Alzheimer's disease in his mother; Colon cancer in his paternal aunt; Colon polyps in his father; Hyperlipidemia in his father; Hypertension in his father and mother.    ROS:  Please see the history of present illness.   Otherwise, review of systems are positive for none.   All other systems are reviewed and negative.    PHYSICAL EXAM: VS:  BP (!) 144/80   Pulse 67   Ht 5\' 11"  (1.803 m)   Wt 219 lb (99.3 kg)   BMI 30.54 kg/m  , BMI Body mass index is 30.54 kg/m. Affect appropriate Healthy:  appears stated age 36: normal Neck supple with no adenopathy JVP normal no bruits no  thyromegaly Lungs clear with no wheezing and good diaphragmatic motion Heart:  S1/S2 no murmur, no rub, gallop or click PMI normal post sternotimy Abdomen: benighn, BS positve, no tenderness, no AAA no bruit.  No HSM or HJR Distal pulses intact with no bruits No edema Neuro non-focal Skin warm and dry No muscular weakness     EKG:   02/09/16 SR RBBB LAD lateral T wave changes  06/29/16 SR rat 49 RBBB LAD LVH ? Old IMI  07/11/17 SR RBBB LAFB old IMI  07/12/18 SR rate 67 RBBB LAFB old IMI no changes   Recent Labs: 11/14/2017: ALT 14; Hemoglobin 15.8; Platelets 313.0; TSH 0.29 01/12/2018: BUN 11; Creatinine, Ser 0.97; Potassium 3.5; Sodium 137  Lipid Panel    Component Value Date/Time   CHOL 98 11/14/2017 0911   TRIG 171.0 (H) 11/14/2017 0911   HDL 23.00 (L) 11/14/2017 0911   CHOLHDL 4 11/14/2017 0911   VLDL 34.2 11/14/2017 0911   LDLCALC 40 11/14/2017 0911   LDLDIRECT 30.0 11/04/2016 0809      Wt Readings from Last 3 Encounters:  07/12/18 219 lb (99.3 kg)  05/09/18 219 lb 12.8 oz (99.7 kg)  04/11/18 215 lb 4 oz (97.6 kg)      Other studies Reviewed: Additional studies/ records that were reviewed today include: CABG report Old cath notes Dr Lia Foyer notes primary Dr Yong Channel .    ASSESSMENT AND PLAN:  1.CAD/CABG:   Cath 07/20/16 patent grafts RCA not grafted occluded with collaterals medical Rx  Can consider CTO of RCA if refractory angina   2.HLD   at goal continue statin labs with primary 3. HTN:  Well controlled.  Continue current medications and low sodium Dash type diet.   4. DM:  Discussed low carb diet.  Target hemoglobin A1c is 6.5 or less.  Continue current medications.    Current medicines are reviewed at length with the patient today.  The patient does not have concerns regarding medicines.  The following changes have been made:  no change  Labs/ tests ordered today include: None   Orders Placed This Encounter  Procedures  . EKG 12-Lead      Disposition:   FU with me in a year      Signed, Jenkins Rouge, MD  07/12/2018 8:56 AM    [facility] [address], [place], [place]  [address] Phone: [phone]; Fax: [phone]

## 2018-07-12 ENCOUNTER — Ambulatory Visit (INDEPENDENT_AMBULATORY_CARE_PROVIDER_SITE_OTHER): Payer: BLUE CROSS/BLUE SHIELD | Admitting: Cardiovascular Disease

## 2018-07-12 VITALS — BP 144/80 | HR 67 | Ht 71.0 in | Wt 219.0 lb

## 2018-07-12 DIAGNOSIS — Z951 Presence of aortocoronary bypass graft: Secondary | ICD-10-CM | POA: Diagnosis not present

## 2018-07-12 DIAGNOSIS — I25708 Atherosclerosis of coronary artery bypass graft(s), unspecified, with other forms of angina pectoris: Secondary | ICD-10-CM | POA: Diagnosis not present

## 2018-07-12 NOTE — Patient Instructions (Signed)

## 2018-07-17 ENCOUNTER — Ambulatory Visit: Payer: BLUE CROSS/BLUE SHIELD | Admitting: Pulmonary Disease

## 2018-07-24 ENCOUNTER — Telehealth: Payer: Self-pay

## 2018-07-24 ENCOUNTER — Ambulatory Visit: Payer: BLUE CROSS/BLUE SHIELD | Admitting: Pulmonary Disease

## 2018-07-24 NOTE — Telephone Encounter (Signed)
Received a notice from [employer] that INVOKAMET [phone] MG TABS has been approved effective 06/24/2018-07/24/2019.

## 2018-07-24 NOTE — Telephone Encounter (Signed)
PA request received from [employer], completed, and faxed back.

## 2018-08-06 ENCOUNTER — Encounter: Payer: Self-pay | Admitting: Family Medicine

## 2018-08-06 ENCOUNTER — Other Ambulatory Visit: Payer: Self-pay | Admitting: Family Medicine

## 2018-08-07 ENCOUNTER — Telehealth: Payer: Self-pay | Admitting: Pulmonary Disease

## 2018-08-07 ENCOUNTER — Other Ambulatory Visit: Payer: Self-pay

## 2018-08-07 DIAGNOSIS — G4733 Obstructive sleep apnea (adult) (pediatric): Secondary | ICD-10-CM

## 2018-08-07 MED ORDER — NITROGLYCERIN 0.4 MG SL SUBL: 0.4000 mg | SUBLINGUAL_TABLET | SUBLINGUAL | 0 refills | Status: DC | PRN

## 2018-08-07 NOTE — Telephone Encounter (Signed)
Order has been placed for pt to have DME changed to [facility] for cpap.   Attempted to call pt to let him know this had been done but unable to reach pt. Left message for pt to let him know that the order had been placed and also stated to pt to call our office so we can get him scheduled for a f/u appt.

## 2018-08-07 NOTE — Telephone Encounter (Signed)
OK to sign switch of DME.   Ensure patient has follow up with Dr. Elsworth Soho.   Aaron Edelman

## 2018-08-07 NOTE — Telephone Encounter (Signed)
Called and spoke with pt to get clarification on what he was needing. Per pt, he received the nasal mask but pt stated due to him being a mouth breather this mask does not work for him and he has been trying to get a new mask. Pt has been told by [facility] multiple times that he has a new mask that is supposed to be coming but each time pt has called, he feels like he is just getting the run around from [place] as he still has not received the new mask that he had requested.  Due to this, pt is wanting to switch from [facility] to [facility] for his cpap.  Due to Dr. Elsworth Soho being out of the office, Aaron Edelman, please advise if you are okay with signing the order for pt to having DME switched?

## 2018-08-10 NOTE — Telephone Encounter (Signed)
Attempted to contact pt. I did not receive an answer. I have left a message for pt to return our call.  

## 2018-08-14 NOTE — Telephone Encounter (Signed)
Spoke with pt, aware of recs.  Pt declined to schedule appt at this time, states he will call to schedule after he received equipment.  Pt does also have a recall in chart for 04/2019, which is when RA had originally wanted to see pt back.  Nothing further needed at this time.

## 2018-08-15 ENCOUNTER — Telehealth: Payer: Self-pay | Admitting: Pulmonary Disease

## 2018-08-15 NOTE — Telephone Encounter (Signed)
Called and spoke with Corene Cornea, he stated that the patient is unable to switch from [facility] to [facility]. Patient was given the CPAP machine back in June of 2019 and it is still under rent per insurance. It can take anywhere from 10-13 months before it is paid off and owned by the patient. Until it is owned by the patient he cannot switch to [facility]. Called patient, unable to reach. Left message to give [place] a call back.

## 2018-08-15 NOTE — Telephone Encounter (Signed)
Corene Cornea returned call, CB is [phone].

## 2018-08-15 NOTE — Telephone Encounter (Signed)
[employer], unable to reach. Left message to give [place] a call back.

## 2018-08-15 NOTE — Telephone Encounter (Signed)
Called patient, unable to reach LMTCB 

## 2018-08-15 NOTE — Telephone Encounter (Signed)
Pt is returning call. Cb is [phone]

## 2018-08-16 NOTE — Telephone Encounter (Signed)
Attempted to call Patient.  Left message to call back. 

## 2018-08-16 NOTE — Telephone Encounter (Signed)
Patient returned phone call; pt contact # [phone];

## 2018-08-16 NOTE — Telephone Encounter (Signed)
Spoke with the pt and notified of recs per Corene Cornea  He verbalized understanding  He will call and speak with his insurance company  Nothing further needed

## 2018-08-17 ENCOUNTER — Telehealth: Payer: Self-pay | Admitting: Pulmonary Disease

## 2018-08-17 NOTE — Telephone Encounter (Signed)
Called and spoke with Patient.  He stated that he was wanting to change DME companies from [facility] to [facility].  He stated that he had all the information that was needed from [place] that [facility] told him he would need. He was trying to call [place] back to let them know and to get the fax number they needed it faxed to.  [place] number given.  Nothing further at this time.

## 2018-08-24 DIAGNOSIS — G4733 Obstructive sleep apnea (adult) (pediatric): Secondary | ICD-10-CM | POA: Diagnosis not present

## 2018-08-28 ENCOUNTER — Encounter: Payer: Self-pay | Admitting: Family Medicine

## 2018-08-28 ENCOUNTER — Other Ambulatory Visit: Payer: Self-pay

## 2018-08-28 MED ORDER — BUDESONIDE-FORMOTEROL FUMARATE 160-4.5 MCG/ACT IN AERO: 2.0000 | INHALATION_SPRAY | Freq: Two times a day (BID) | RESPIRATORY_TRACT | 1 refills | Status: DC

## 2018-08-31 ENCOUNTER — Telehealth: Payer: Self-pay

## 2018-08-31 ENCOUNTER — Ambulatory Visit: Payer: BLUE CROSS/BLUE SHIELD | Admitting: Family Medicine

## 2018-08-31 NOTE — Telephone Encounter (Signed)
Prior Authorization submitted for budesonide-formoterol (SYMBICORT) 160-4.5 MCG/ACT inhaler on Cover My Meds.  Verita Lamb (Key: [id number])   This request has been approved. Please note any additional information provided by [employer] at the bottom of your screen.

## 2018-09-05 ENCOUNTER — Other Ambulatory Visit: Payer: Self-pay | Admitting: Family Medicine

## 2018-09-05 ENCOUNTER — Encounter: Payer: Self-pay | Admitting: Family Medicine

## 2018-09-14 ENCOUNTER — Encounter: Payer: Self-pay | Admitting: Family Medicine

## 2018-09-15 ENCOUNTER — Other Ambulatory Visit: Payer: Self-pay

## 2018-09-15 NOTE — Progress Notes (Signed)
Sorry-pt was requesting a refill of both of these meds.  Alprazolam 0.5 MG  Zaleplon 10 MG   No refills remaining. [facility] [address].

## 2018-09-15 NOTE — Progress Notes (Signed)
Please see message and advise, ok to refill both?

## 2018-09-15 NOTE — Progress Notes (Signed)
No message was given to me- nothing is attached to this note

## 2018-09-16 ENCOUNTER — Encounter: Payer: Self-pay | Admitting: Family Medicine

## 2018-09-18 ENCOUNTER — Other Ambulatory Visit: Payer: Self-pay

## 2018-09-18 MED ORDER — ALPRAZOLAM 0.5 MG PO TABS: 0.5000 mg | ORAL_TABLET | Freq: Four times a day (QID) | ORAL | 5 refills | Status: DC | PRN

## 2018-09-18 MED ORDER — ZALEPLON 10 MG PO CAPS: 10.0000 mg | ORAL_CAPSULE | Freq: Every evening | ORAL | 5 refills | Status: DC | PRN

## 2018-09-18 NOTE — Progress Notes (Signed)
Sorry about the "yes thanks" comment- please inform patient I sent in medication

## 2018-09-18 NOTE — Progress Notes (Signed)
Yes thanks 

## 2018-09-18 NOTE — Progress Notes (Signed)
Sent in please inform patient 

## 2018-09-18 NOTE — Addendum Note (Signed)
Addended by: Marin Olp on: 09/18/2018 07:53 AM   Modules accepted: Orders

## 2018-09-24 DIAGNOSIS — G4733 Obstructive sleep apnea (adult) (pediatric): Secondary | ICD-10-CM | POA: Diagnosis not present

## 2018-09-29 ENCOUNTER — Ambulatory Visit: Payer: BLUE CROSS/BLUE SHIELD | Admitting: Family Medicine

## 2018-09-29 ENCOUNTER — Other Ambulatory Visit: Payer: Self-pay

## 2018-09-29 MED ORDER — GLUCOSE BLOOD VI STRP: ORAL_STRIP | 12 refills | Status: DC

## 2018-10-03 ENCOUNTER — Ambulatory Visit: Payer: BLUE CROSS/BLUE SHIELD | Admitting: Family Medicine

## 2018-10-03 ENCOUNTER — Telehealth: Payer: Self-pay | Admitting: Family Medicine

## 2018-10-03 ENCOUNTER — Encounter: Payer: Self-pay | Admitting: Family Medicine

## 2018-10-03 NOTE — Telephone Encounter (Signed)
Spoke to pt and he was wondering if he should come in for A1c check before his CPE in April. Pt is concerned that if he has this done in 3/6 at his rescheduled appt it may be too close to his CPE labs. Pt is worried about Environmental education officer. Pt was advised that if we would have done them today, it still would not be in the correct time frame ( 56months and 1 day). I informed pt that Dr. Yong Channel was prob not going to add it to hi CPE labs if he was going to get it today. I advised pt that  I would speak to Dr. Yong Channel about it when he comes in. Pt verbalized understanding.     Dr. Yong Channel- Should pt be added to lab schedule, same day appt for rescheduled  for f/u or wait til CPE in April?  Please advise

## 2018-10-03 NOTE — Telephone Encounter (Signed)
Copied from [place] [id number]. Topic: General - Other >> Oct 03, 2018  8:50 AM Keene Breath wrote: Reason for CRM: Patient called to request that Roselyn Reef, the nurse, call him regarding scheduling.  Please call patient at [phone]

## 2018-10-04 NOTE — Telephone Encounter (Signed)
The only thing I see scheduled for him is 10/13/2018 - I would say he should just push this visit back into April and schedule as CPE- can you get him set up for a physical 1 year from his last one and we will do a1c at that time?  Would not do one early at this time.   Please tell him sorry for me being out- tell him I was sick with GI illness

## 2018-10-04 NOTE — Telephone Encounter (Signed)
Noted!  I tried to schedule pt but per protocol your next availability is the end of April and pt cannot come in that day. You have availability for April 8th pt exact physical date but its your half day and  You already have 2 CPE that am. Is it okay to schedule pt for April 8th or okay to schedule pt for some time in May.  Please advise

## 2018-10-04 NOTE — Telephone Encounter (Signed)
Yes thanks- can schedule him for that day but please not in 8 20 as that would be 3 physicals back to back

## 2018-10-05 ENCOUNTER — Other Ambulatory Visit: Payer: Self-pay | Admitting: Family Medicine

## 2018-10-05 NOTE — Telephone Encounter (Signed)
Pt scheduled. Pt notified of update! No further action needed!

## 2018-10-09 ENCOUNTER — Encounter: Payer: Self-pay | Admitting: Family Medicine

## 2018-10-10 ENCOUNTER — Encounter: Payer: Self-pay | Admitting: Family Medicine

## 2018-10-11 ENCOUNTER — Encounter: Payer: Self-pay | Admitting: Gastroenterology

## 2018-10-11 NOTE — Telephone Encounter (Signed)
Spoke to pt and he stated everything was good and wanted to verify his CPE appt.   No further action needed!

## 2018-10-11 NOTE — Telephone Encounter (Signed)
Spoke to pt and he just wanted to verify his CPE date. No further action needed!

## 2018-10-13 ENCOUNTER — Ambulatory Visit: Payer: BLUE CROSS/BLUE SHIELD | Admitting: Family Medicine

## 2018-10-17 ENCOUNTER — Encounter: Payer: Self-pay | Admitting: Pulmonary Disease

## 2018-10-17 ENCOUNTER — Ambulatory Visit (INDEPENDENT_AMBULATORY_CARE_PROVIDER_SITE_OTHER): Payer: BLUE CROSS/BLUE SHIELD | Admitting: Pulmonary Disease

## 2018-10-17 DIAGNOSIS — Q859 Phakomatosis, unspecified: Secondary | ICD-10-CM

## 2018-10-17 DIAGNOSIS — G4733 Obstructive sleep apnea (adult) (pediatric): Secondary | ICD-10-CM

## 2018-10-17 NOTE — Assessment & Plan Note (Signed)
Set up CT chest with contrast for June 2020

## 2018-10-17 NOTE — Progress Notes (Signed)
   Subjective:    Patient ID: Larry Garner, male    DOB: March 13, 1956, 63 y.o.   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 He was skeptical but finally got on CPAP machine.  Feels better rested, more energy in the daytime.  Has been able to tolerate well, he is a mouth breather suicidal with a full facemask, complains of mild mild dryness but overall pressure is okay he has been able to travel with this. CPAP download was reviewed which shows excellent compliance more than 5 hours every night, no residuals and average pressure of 9 cm with maximum of 10 cm with minimal leak    Significant tests/ events reviewed  HST 01/2018 AHI 11/h, worse in supine  CT chest w contrast 01/2018 2.2 cm LLL nodule, stable compared to 11/2016  Review of Systems neg for any significant sore throat, dysphagia, itching, sneezing, nasal congestion or excess/ purulent secretions, fever, chills, sweats, unintended wt loss, pleuritic or exertional cp, hempoptysis, orthopnea pnd or change in chronic leg swelling. Also denies presyncope, palpitations, heartburn, abdominal pain, nausea, vomiting, diarrhea or change in bowel or urinary habits, dysuria,hematuria, rash, arthralgias, visual complaints, headache, numbness weakness or ataxia.     Objective:   Physical Exam   Gen. Pleasant, obese, in no distress ENT - no lesions, no post nasal drip Neck: No JVD, no thyromegaly, no carotid bruits Lungs: no use of accessory muscles, no dullness to percussion, decreased without rales or rhonchi  Cardiovascular: Rhythm regular, heart sounds  normal, no murmurs or gallops, no peripheral edema Musculoskeletal: No deformities, no  cyanosis or clubbing , no tremors        Assessment & Plan:

## 2018-10-17 NOTE — Assessment & Plan Note (Signed)
CPAP is working well on current settings. CPAP supplies will be renewed for a year. Change to auto CPAP 5 to 12 cm.  Weight loss encouraged, compliance with goal of at least 4-6 hrs every night is the expectation. Advised against medications with sedative side effects Cautioned against driving when sleepy - understanding that sleepiness will vary on a day to day basis

## 2018-10-17 NOTE — Patient Instructions (Signed)
CPAP is working well on current settings. CPAP supplies will be renewed for a year. Change to auto CPAP 5 to 12 cm.  Set up CT chest with contrast for June 2020

## 2018-10-23 DIAGNOSIS — G4733 Obstructive sleep apnea (adult) (pediatric): Secondary | ICD-10-CM | POA: Diagnosis not present

## 2018-10-25 DIAGNOSIS — G4733 Obstructive sleep apnea (adult) (pediatric): Secondary | ICD-10-CM | POA: Diagnosis not present

## 2018-10-27 ENCOUNTER — Encounter: Payer: Self-pay | Admitting: Gastroenterology

## 2018-11-08 ENCOUNTER — Encounter: Payer: Self-pay | Admitting: Family Medicine

## 2018-11-10 ENCOUNTER — Telehealth: Payer: Self-pay | Admitting: Family Medicine

## 2018-11-10 NOTE — Telephone Encounter (Signed)
Please call the patient to reschedule.

## 2018-11-10 NOTE — Telephone Encounter (Signed)
Copied from [facility] [phone]. Topic: Appointment Scheduling - Scheduling Inquiry for Clinic >> Nov 10, 2018  4:03 PM Blase Mess A wrote: Reason for CRM: Patient is calling back to reschedule his 11/15/18 appt. Please advise. CB [phone]

## 2018-11-11 NOTE — Telephone Encounter (Signed)
Want to make sure we are offering video visits and see if patients are interested before rescheduling

## 2018-11-13 NOTE — Telephone Encounter (Signed)
Patient called back in and just wanted to push out CPE and would call back if he needed anything.

## 2018-11-13 NOTE — Telephone Encounter (Signed)
Please call and offer video visit to patient for 04/08 appoinment.

## 2018-11-13 NOTE — Telephone Encounter (Signed)
FYI

## 2018-11-13 NOTE — Telephone Encounter (Signed)
Noted  

## 2018-11-13 NOTE — Telephone Encounter (Signed)
Maddy, please contact patient to offer doxy before rescheduling

## 2018-11-15 ENCOUNTER — Ambulatory Visit: Payer: BLUE CROSS/BLUE SHIELD | Admitting: Family Medicine

## 2018-11-23 DIAGNOSIS — G4733 Obstructive sleep apnea (adult) (pediatric): Secondary | ICD-10-CM | POA: Diagnosis not present

## 2018-11-24 ENCOUNTER — Encounter: Payer: BLUE CROSS/BLUE SHIELD | Admitting: Gastroenterology

## 2018-12-18 ENCOUNTER — Encounter: Payer: Self-pay | Admitting: Gastroenterology

## 2018-12-18 ENCOUNTER — Other Ambulatory Visit: Payer: Self-pay

## 2018-12-18 ENCOUNTER — Ambulatory Visit (AMBULATORY_SURGERY_CENTER): Payer: Self-pay | Admitting: *Deleted

## 2018-12-18 VITALS — Ht 71.0 in | Wt 208.0 lb

## 2018-12-18 DIAGNOSIS — Z86010 Personal history of colonic polyps: Secondary | ICD-10-CM

## 2018-12-18 MED ORDER — NA SULFATE-K SULFATE-MG SULF 17.5-3.13-1.6 GM/177ML PO SOLN: 1.0000 | Freq: Once | ORAL | 0 refills | Status: AC

## 2018-12-18 NOTE — Progress Notes (Signed)
No egg or soy allergy known to patient  No issues with past sedation with any surgeries  or procedures, no intubation problems  No diet pills per patient No home 02 use per patient  No blood thinners per patient  Pt denies issues with constipation  No A fib or A flutter  EMMI video sent to pt's e mail   Pt mailed instruction packet to included paper to complete and mail back to [facility] with addressed and stamped envelope, Emmi video, copy of consent form to read and not return, and instructions. Suprep  coupon mailed in packet. PV completed over the phone. Pt encouraged to call with questions or issues

## 2018-12-22 ENCOUNTER — Telehealth: Payer: Self-pay | Admitting: *Deleted

## 2018-12-22 NOTE — Telephone Encounter (Signed)
Covid-19 travel screening questions  Have you traveled in the last 14 days? no If yes where?  Do you now or have you had a fever in the last 14 days? no  Do you have any respiratory symptoms of shortness of breath or cough now or in the last 14 days? No  Do you have any family members or close contacts with diagnosed or suspected Covid-19? No  Pt is aware that care partner will be waiting in car during procedure.  He will wear a mask into building

## 2018-12-23 DIAGNOSIS — G4733 Obstructive sleep apnea (adult) (pediatric): Secondary | ICD-10-CM | POA: Diagnosis not present

## 2018-12-25 ENCOUNTER — Encounter: Payer: Self-pay | Admitting: Gastroenterology

## 2018-12-25 ENCOUNTER — Other Ambulatory Visit: Payer: Self-pay

## 2018-12-25 ENCOUNTER — Ambulatory Visit (AMBULATORY_SURGERY_CENTER): Payer: BLUE CROSS/BLUE SHIELD | Admitting: Gastroenterology

## 2018-12-25 VITALS — BP 136/77 | HR 64 | Temp 98.8°F | Resp 29 | Ht 71.0 in | Wt 220.0 lb

## 2018-12-25 DIAGNOSIS — Z86010 Personal history of colonic polyps: Secondary | ICD-10-CM

## 2018-12-25 DIAGNOSIS — K621 Rectal polyp: Secondary | ICD-10-CM | POA: Diagnosis not present

## 2018-12-25 DIAGNOSIS — Z1211 Encounter for screening for malignant neoplasm of colon: Secondary | ICD-10-CM | POA: Diagnosis not present

## 2018-12-25 DIAGNOSIS — D128 Benign neoplasm of rectum: Secondary | ICD-10-CM

## 2018-12-25 MED ORDER — SODIUM CHLORIDE 0.9 % IV SOLN: 500.0000 mL | Freq: Once | INTRAVENOUS | Status: DC

## 2018-12-25 NOTE — Progress Notes (Signed)
Pt's states no medical or surgical changes since previsit or office visit. 

## 2018-12-25 NOTE — Progress Notes (Signed)
Called to room to assist during endoscopic procedure.  Patient ID and intended procedure confirmed with present staff. Received instructions for my participation in the procedure from the performing physician.  

## 2018-12-25 NOTE — Patient Instructions (Signed)
Thank you for allowing [place] to care for you today!  Await pathology results by mail, approximately 2 weeks.  Recommend next colonoscopy in 7 years.  Recommend high fiber diet.  Handouts given for this, diverticulosis, polyps, and hemorrhoids.        YOU HAD AN ENDOSCOPIC PROCEDURE TODAY AT [facility]:   Refer to the procedure report that was given to you for any specific questions about what was found during the examination.  If the procedure report does not answer your questions, please call your gastroenterologist to clarify.  If you requested that your care partner not be given the details of your procedure findings, then the procedure report has been included in a sealed envelope for you to review at your convenience later.  YOU SHOULD EXPECT: Some feelings of bloating in the abdomen. Passage of more gas than usual.  Walking can help get rid of the air that was put into your GI tract during the procedure and reduce the bloating. If you had a lower endoscopy (such as a colonoscopy or flexible sigmoidoscopy) you may notice spotting of blood in your stool or on the toilet paper. If you underwent a bowel prep for your procedure, you may not have a normal bowel movement for a few days.  Please Note:  You might notice some irritation and congestion in your nose or some drainage.  This is from the oxygen used during your procedure.  There is no need for concern and it should clear up in a day or so.  SYMPTOMS TO REPORT IMMEDIATELY:   Following lower endoscopy (colonoscopy or flexible sigmoidoscopy):  Excessive amounts of blood in the stool  Significant tenderness or worsening of abdominal pains  Swelling of the abdomen that is new, acute  Fever of 100F or higher   For urgent or emergent issues, a gastroenterologist can be reached at any hour by calling [phone].   DIET:  We do recommend a small meal at first, but then you may proceed to your regular diet.  Drink  plenty of fluids but you should avoid alcoholic beverages for 24 hours.  ACTIVITY:  You should plan to take it easy for the rest of today and you should NOT DRIVE or use heavy machinery until tomorrow (because of the sedation medicines used during the test).    FOLLOW UP: Our staff will call the number listed on your records 48-72 hours following your procedure to check on you and address any questions or concerns that you may have regarding the information given to you following your procedure. If we do not reach you, we will leave a message.  We will attempt to reach you two times.  During this call, we will ask if you have developed any symptoms of COVID 19. If you develop any symptoms (for example fever, flu-like symptoms, shortness of breath, cough etc.) before then, please call [phone].  If any biopsies were taken you will be contacted by phone or by letter within the next 1-3 weeks.  Please call us at [phone] if you have not heard about the biopsies in 3 weeks.    SIGNATURES/CONFIDENTIALITY: You and/or your care partner have signed paperwork which will be entered into your electronic medical record.  These signatures attest to the fact that that the information above on your After Visit Summary has been reviewed and is understood.  Full responsibility of the confidentiality of this discharge information lies with you and/or your care-partner.

## 2018-12-25 NOTE — Op Note (Signed)
[facility] Patient Name: Larry Garner Procedure Date: 12/25/2018 7:46 AM MRN: [id number] Endoscopist: Ladene Artist , MD Age: 62 Referring MD:  Date of Birth: 1955-10-30 Gender: Male Account #: [id number] Procedure:                Colonoscopy Indications:              Surveillance: Personal history of adenomatous                            polyps on last colonoscopy 5 years ago Medicines:                Monitored Anesthesia Care Procedure:                Pre-Anesthesia Assessment:                           - Prior to the procedure, a History and Physical                            was performed, and patient medications and                            allergies were reviewed. The patient's tolerance of                            previous anesthesia was also reviewed. The risks                            and benefits of the procedure and the sedation                            options and risks were discussed with the patient.                            All questions were answered, and informed consent                            was obtained. Prior Anticoagulants: The patient has                            taken no previous anticoagulant or antiplatelet                            agents. ASA Grade Assessment: II - A patient with                            mild systemic disease. After reviewing the risks                            and benefits, the patient was deemed in                            satisfactory condition to undergo the procedure.  After obtaining informed consent, the colonoscope                            was passed under direct vision. Throughout the                            procedure, the patient's blood pressure, pulse, and                            oxygen saturations were monitored continuously. The                            Colonoscope was introduced through the anus and                            advanced to the the cecum,  identified by                            appendiceal orifice and ileocecal valve. The                            ileocecal valve, appendiceal orifice, and rectum                            were photographed. The quality of the bowel                            preparation was excellent. The colonoscopy was                            performed without difficulty. The patient tolerated                            the procedure well. Scope In: 7:50:44 AM Scope Out: 5:62:56 AM Scope Withdrawal Time: 0 hours 13 minutes 39 seconds  Total Procedure Duration: 0 hours 15 minutes 34 seconds  Findings:                 The perianal and digital rectal examinations were                            normal.                           A 4 mm polyp was found in the rectum. The polyp was                            sessile. The polyp was removed with a cold biopsy                            forceps. Resection and retrieval were complete.                           Internal hemorrhoids were found during  retroflexion. The hemorrhoids were mild and Grade I                            (internal hemorrhoids that do not prolapse).                           Multiple medium-mouthed diverticula were found in                            the left colon. There was no evidence of                            diverticular bleeding.                           The exam was otherwise without abnormality on                            direct and retroflexion views. Complications:            No immediate complications. Estimated blood loss:                            None. Estimated Blood Loss:     Estimated blood loss: none. Impression:               - One 4 mm polyp in the rectum, removed with a cold                            biopsy forceps. Resected and retrieved.                           - Internal hemorrhoids.                           - Moderate diverticulosis in the left colon.                            - The examination was otherwise normal on direct                            and retroflexion views. Recommendation:           - Repeat colonoscopy in 7 years for surveillance.                           - Patient has a contact number available for                            emergencies. The signs and symptoms of potential                            delayed complications were discussed with the                            patient. Return to normal activities tomorrow.  Written discharge instructions were provided to the                            patient.                           - High fiber diet.                           - Continue present medications.                           - Await pathology results. Ladene Artist, MD 12/25/2018 8:11:30 AM This report has been signed electronically.

## 2018-12-25 NOTE — Progress Notes (Signed)
To PACU, VSS. Report to [email or web address] 

## 2018-12-27 ENCOUNTER — Telehealth: Payer: Self-pay

## 2018-12-27 NOTE — Telephone Encounter (Signed)
Follow up call made no answer, left voicemail.

## 2018-12-27 NOTE — Telephone Encounter (Signed)
Follow up call x 2, left a voicemail.

## 2019-01-02 ENCOUNTER — Encounter: Payer: Self-pay | Admitting: Gastroenterology

## 2019-01-12 DIAGNOSIS — D1801 Hemangioma of skin and subcutaneous tissue: Secondary | ICD-10-CM | POA: Diagnosis not present

## 2019-01-12 DIAGNOSIS — L814 Other melanin hyperpigmentation: Secondary | ICD-10-CM | POA: Diagnosis not present

## 2019-01-12 DIAGNOSIS — L304 Erythema intertrigo: Secondary | ICD-10-CM | POA: Diagnosis not present

## 2019-01-12 DIAGNOSIS — L570 Actinic keratosis: Secondary | ICD-10-CM | POA: Diagnosis not present

## 2019-01-12 DIAGNOSIS — L821 Other seborrheic keratosis: Secondary | ICD-10-CM | POA: Diagnosis not present

## 2019-01-16 ENCOUNTER — Other Ambulatory Visit: Payer: Self-pay

## 2019-01-16 ENCOUNTER — Ambulatory Visit (INDEPENDENT_AMBULATORY_CARE_PROVIDER_SITE_OTHER): Payer: BC Managed Care – PPO | Admitting: Family Medicine

## 2019-01-16 ENCOUNTER — Encounter: Payer: Self-pay | Admitting: Family Medicine

## 2019-01-16 VITALS — BP 134/78 | HR 73 | Temp 98.3°F | Ht 70.5 in | Wt 211.4 lb

## 2019-01-16 DIAGNOSIS — E1169 Type 2 diabetes mellitus with other specified complication: Secondary | ICD-10-CM

## 2019-01-16 DIAGNOSIS — E119 Type 2 diabetes mellitus without complications: Secondary | ICD-10-CM | POA: Diagnosis not present

## 2019-01-16 DIAGNOSIS — I152 Hypertension secondary to endocrine disorders: Secondary | ICD-10-CM

## 2019-01-16 DIAGNOSIS — Z125 Encounter for screening for malignant neoplasm of prostate: Secondary | ICD-10-CM | POA: Diagnosis not present

## 2019-01-16 DIAGNOSIS — R7989 Other specified abnormal findings of blood chemistry: Secondary | ICD-10-CM | POA: Diagnosis not present

## 2019-01-16 DIAGNOSIS — E1159 Type 2 diabetes mellitus with other circulatory complications: Secondary | ICD-10-CM | POA: Diagnosis not present

## 2019-01-16 DIAGNOSIS — E663 Overweight: Secondary | ICD-10-CM

## 2019-01-16 DIAGNOSIS — Q859 Phakomatosis, unspecified: Secondary | ICD-10-CM

## 2019-01-16 DIAGNOSIS — I2510 Atherosclerotic heart disease of native coronary artery without angina pectoris: Secondary | ICD-10-CM

## 2019-01-16 DIAGNOSIS — Z0000 Encounter for general adult medical examination without abnormal findings: Secondary | ICD-10-CM

## 2019-01-16 DIAGNOSIS — E785 Hyperlipidemia, unspecified: Secondary | ICD-10-CM

## 2019-01-16 DIAGNOSIS — I10 Essential (primary) hypertension: Secondary | ICD-10-CM

## 2019-01-16 LAB — COMPREHENSIVE METABOLIC PANEL
ALT: 18 U/L (ref 0–53)
AST: 14 U/L (ref 0–37)
Albumin: 4.3 g/dL (ref 3.5–5.2)
Alkaline Phosphatase: 65 U/L (ref 39–117)
BUN: 19 mg/dL (ref 6–23)
CO2: 25 mEq/L (ref 19–32)
Calcium: 9.5 mg/dL (ref 8.4–10.5)
Chloride: 102 mEq/L (ref 96–112)
Creatinine, Ser: 0.87 mg/dL (ref 0.40–1.50)
GFR: 88.46 mL/min (ref >60.00)
Glucose, Bld: 135 mg/dL — ABNORMAL HIGH (ref 70–99)
Potassium: 3.9 mEq/L (ref 3.5–5.1)
Sodium: 138 mEq/L (ref 135–145)
Total Bilirubin: 0.7 mg/dL (ref 0.2–1.2)
Total Protein: 7.1 g/dL (ref 6.0–8.3)

## 2019-01-16 LAB — CBC
HCT: 46.2 % (ref 39.0–52.0)
Hemoglobin: 15.7 g/dL (ref 13.0–17.0)
MCHC: 33.9 g/dL (ref 30.0–36.0)
MCV: 88.8 fl (ref 78.0–100.0)
Platelets: 250 10*3/uL (ref 150.0–400.0)
RBC: 5.21 Mil/uL (ref 4.22–5.81)
RDW: 13.1 % (ref 11.5–15.5)
WBC: 8.8 10*3/uL (ref 4.0–10.5)

## 2019-01-16 LAB — LIPID PANEL
Cholesterol: 113 mg/dL (ref 0–200)
HDL: 28.6 mg/dL — ABNORMAL LOW (ref >39.00)
NonHDL: 84.55
Total CHOL/HDL Ratio: 4
Triglycerides: 282 mg/dL — ABNORMAL HIGH (ref 0.0–149.0)
VLDL: 56.4 mg/dL — ABNORMAL HIGH (ref 0.0–40.0)

## 2019-01-16 LAB — T4, FREE: Free T4: 0.96 ng/dL (ref 0.60–1.60)

## 2019-01-16 LAB — TSH: TSH: 0.2 u[IU]/mL — ABNORMAL LOW (ref 0.35–4.50)

## 2019-01-16 LAB — HEMOGLOBIN A1C: Hgb A1c MFr Bld: 9.4 % — ABNORMAL HIGH (ref 4.6–6.5)

## 2019-01-16 LAB — LDL CHOLESTEROL, DIRECT: Direct LDL: 54 mg/dL

## 2019-01-16 LAB — T3, FREE: T3, Free: 3.4 pg/mL (ref 2.3–4.2)

## 2019-01-16 LAB — PSA: PSA: 1.07 ng/mL (ref 0.10–4.00)

## 2019-01-16 NOTE — Patient Instructions (Addendum)
Health Maintenance Due  Topic Date Due  . HEMOGLOBIN A1C - today with labs 10/10/2018  . FOOT EXAM - today 11/15/2018   Just have to get back on track with regular exercise and healthy eating to keep those sugars in check. You have turned it around before and think you can do it again without medicine increases. Lets say 4 month follow up if a1c over 7.5 and 6 month if under that.   Please stop by lab before you go If you do not have mychart- we will call you about results within 5 business days of [place] receiving them.  If you have mychart- we will send your results within 3 business days of [place] receiving them.  If abnormal or we want to clarify a result, we will call or mychart you to make sure you receive the message.  If you have questions or concerns or don't hear within 5-7 days, please send [place] a message or call us.

## 2019-01-16 NOTE — Progress Notes (Signed)
Phone: [phone]   Subjective:  Patient presents today for their annual physical. Chief complaint-noted.   See problem oriented charting- ROS- full  review of systems was completed and negative including No chest pain or shortness of breath. No headache or blurry vision (has noted some mild changes- was told by Dr. Sabra Heck function of age)  The following were reviewed and entered/updated in epic: Past Medical History:  Diagnosis Date  . Allergic rhinitis   . Allergy   . Anxiety   . Asthma   . Diabetes ([place])   . Diverticulitis   . GERD (gastroesophageal reflux disease)    very occ  . History of heart attack   . HLD (hyperlipidemia)   . HTN (hypertension)   . Myocardial infarction ([place]) 2000  . Sleep apnea    wears cpap   . Tubular adenoma of colon 09/2008   Patient Active Problem List   Diagnosis Date Noted  . Well controlled type 2 diabetes mellitus ([place]) 03/31/2010    Priority: High  . Coronary artery disease involving native coronary artery of native heart without angina pectoris 04/06/2007    Priority: High  . Insomnia 05/27/2014    Priority: Medium  . Asthma, moderate persistent 06/19/2008    Priority: Medium  . ERECTILE DYSFUNCTION, SECONDARY TO MEDICATION 05/17/2007    Priority: Medium  . Hyperlipidemia 04/06/2007    Priority: Medium  . Anxiety state 04/06/2007    Priority: Medium  . Essential hypertension 04/06/2007    Priority: Medium  . Former smoker 11/04/2014    Priority: Low  . Plantar wart of left foot 11/27/2010    Priority: Low  . PSA, INCREASED 09/15/2009    Priority: Low  . Diverticulitis of colon 05/08/2009    Priority: Low  . ANKLE PAIN, CHRONIC 06/05/2007    Priority: Low  . Allergic rhinitis 05/17/2007    Priority: Low  . OSA (obstructive sleep apnea) 01/09/2018  . Hamartoma ([place]) 03/15/2017  . Abnormal nuclear stress test 07/20/2016   Past Surgical History:  Procedure Laterality Date  . CARDIAC CATHETERIZATION N/A 07/20/2016   Procedure: Left Heart Cath and Cors/Grafts Angiography;  Surgeon: Belva Crome, MD;  Location: [facility];  Service: Cardiovascular;  Laterality: N/A;  . COLONOSCOPY    . CORONARY ANGIOPLASTY    . CORONARY ARTERY BYPASS GRAFT  06/1999  . POLYPECTOMY    . TONSILLECTOMY     late50's early 50's    Family History  Problem Relation Age of Onset  . Hypertension Mother   . Alzheimer's disease Mother   . Colon polyps Mother   . Hyperlipidemia Father   . Hypertension Father   . Colon polyps Father   . Colon cancer Paternal Aunt   . Pancreatic cancer Neg Hx   . Stomach cancer Neg Hx   . Thyroid disease Neg Hx   . Esophageal cancer Neg Hx   . Rectal cancer Neg Hx     Medications- reviewed and updated Current Outpatient Medications  Medication Sig Dispense Refill  . albuterol (PROVENTIL HFA;VENTOLIN HFA) 108 (90 Base) MCG/ACT inhaler Inhale 2 puffs into the lungs every 6 (six) hours as needed for wheezing or shortness of breath. 1 Inhaler 5  . ALPRAZolam (XANAX) 0.5 MG tablet Take 1 tablet (0.5 mg total) by mouth every 6 (six) hours as needed. 30 tablet 5  . amLODipine (NORVASC) 5 MG tablet TAKE 1 TABLET BY MOUTH EVERY DAY 90 tablet 1  . aspirin EC 81 MG tablet Take  1 tablet (81 mg total) by mouth daily. 90 tablet 3  . Azelastine-Fluticasone (DYMISTA) 137-50 MCG/ACT SUSP 1 spray each nostril bid (Patient taking differently: Place 1 spray into both nostrils daily as needed (congestion). ) 1 Bottle 5  . bisoprolol-hydrochlorothiazide (ZIAC) 10-6.25 MG tablet Take 1 tablet by mouth daily. 90 tablet 3  . budesonide-formoterol (SYMBICORT) 160-4.5 MCG/ACT inhaler Inhale 2 puffs into the lungs 2 (two) times daily. 10.2 Inhaler 1  . famciclovir (FAMVIR) 500 MG tablet TAKE 3 TABLETS BY MOUTH EVERY DAY AS NEEDED AT FIRST SIGN OF OUTBREAK OF FEVER BLISTER 15 tablet 1  . folic acid (FOLVITE) 517 MCG tablet Take 1,600 mcg by mouth daily.      Marland Kitchen glimepiride (AMARYL) 2 MG tablet Take 1 tablet (2  mg total) by mouth daily before breakfast. 90 tablet 3  . glucose blood (BAYER CONTOUR NEXT TEST) test strip Use to test blood sugars daily. Dx: E11.9 100 each 12  . INVOKAMET [phone] MG TABS TAKE 1 TABLET TWICE A DAY 180 tablet 1  . JANUVIA 100 MG tablet TAKE 1 TABLET BY MOUTH EVERY DAY 90 tablet 1  . ketoconazole (NIZORAL) 2 % cream     . losartan (COZAAR) 100 MG tablet Take 1 tablet (100 mg total) by mouth daily. 90 tablet 3  . Multiple Vitamin (MULTIVITAMIN) tablet Take 1 tablet by mouth daily.      . nitroGLYCERIN (NITROLINGUAL) 0.4 MG/SPRAY spray USE AS DIRECTED 30 g 2  . nitroGLYCERIN (NITROSTAT) 0.4 MG SL tablet Place 1 tablet (0.4 mg total) under the tongue every 5 (five) minutes as needed for chest pain. 30 tablet 0  . omega-3 acid ethyl esters (LOVAZA) 1 g capsule TAKE 1 CAPSULE THREE TIMES A DAY 90 capsule 12  . oxymetazoline (AFRIN) 0.05 % nasal spray Place 1 spray into both nostrils 2 (two) times daily as needed for congestion.    . rosuvastatin (CRESTOR) 20 MG tablet Take 1 tablet (20 mg total) by mouth daily. 90 tablet 3  . triamcinolone cream (KENALOG) 0.1 %     . zaleplon (SONATA) 10 MG capsule Take 1 capsule (10 mg total) by mouth at bedtime as needed. for sleep 30 capsule 5   No current facility-administered medications for this visit.     Allergies-reviewed and updated Allergies  Allergen Reactions  . Morphine Sulfate Nausea And Vomiting and Swelling   Social History   Social History Narrative   Married  In 1981 with 2 kids (son and daughter). No grandkids.       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 Oropharynx normal Neck: no thyromegaly CV: RRR no murmurs rubs or gallops  Lungs: CTAB no crackles, wheeze, rhonchi Abdomen: soft/nontender/nondistended/normal bowel sounds. No rebound or guarding. Overweight  Ext: no edema Skin: warm, dry Neuro: grossly normal, moves all extremities, PERRLA  Diabetic Foot Exam - Simple   Simple Foot Form Diabetic Foot exam was performed with the following findings:  Yes 01/16/2019  9:13 AM  Visual Inspection No deformities, no ulcerations, no other skin breakdown bilaterally:  Yes Sensation Testing Intact to touch and monofilament testing  bilaterally:  Yes Pulse Check Posterior Tibialis and Dorsalis pulse intact bilaterally:  Yes Comments Pt has noticed tingling on bottom of both feet.      Assessment and Plan  63 y.o. male presenting for annual physical.  Health Maintenance counseling: 1. Anticipatory guidance: Patient counseled regarding regular dental exams -q6 months, eye exams -yearly,  avoiding smoking and second hand smoke , limiting alcohol to 2 beverages per day .   2. Risk factor reduction:  Advised patient of need for regular exercise and diet rich and fruits and vegetables to reduce risk of heart attack and stroke. Exercise- has been down- he is going to try to start - thinking about evening when it cools off . Diet-has slipped up some with covid 19.  Encouraged need for healthy eating, regular exercise, weight loss.  Wt Readings from Last 3 Encounters:  01/16/19 211 lb 6.6 oz (95.9 kg)  12/25/18 220 lb (99.8 kg)  12/18/18 208 lb (94.3 kg)  3. Immunizations/screenings/ancillary studies- up to date other than shingrix on vaccinations- - opted to wait given overlap of SE with covid 19. Foot exam today.  Immunization History  Administered Date(s) Administered  . Influenza Split 05/12/2011, 04/25/2012  . Influenza Whole 05/17/2007, 05/10/2008, 06/06/2009, 06/10/2010  . Influenza,inj,Quad PF,6+ Mos 05/16/2013, 05/27/2014, 05/29/2015, 05/11/2016, 05/09/2017, 05/09/2018  . Pneumococcal Conjugate-13 08/14/2014  .  Pneumococcal Polysaccharide-23 11/05/2015  . Td 08/09/2002  . Tdap 10/20/2012  . Zoster 07/18/2015  4. Prostate cancer screening-   Will trend PSA today. Defer rectal exam given low risk psa trend. Nocturia 1-2x a night.  Lab Results  Component Value Date   PSA 1.20 11/14/2017   PSA 0.86 11/04/2016   PSA 1.21 10/29/2015   5. Colon cancer screening - 12/2018 with 7 year follow up due to polyp history 6. Skin cancer screening-referred to derm last visit and he will go yearly with concern seborrheic keratosis next to right nipple- he saw them just on Friday and they said same diagnosis. advised regular sunscreen use. Denies worrisome, changing, or new skin lesions.  7. former smoker- quit 1979. At 36 discuss AAA scan potentiall.y  Status of chronic or acute concerns   # covid 19 has been most stressful time of his professional career. Has 3 more years. 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 Work is bleeding over into home and doesn't feel as productive. He is looking forward to retirement eagerly.    #CAD- remains on aspirin and statin. Follows with cardiology in december. Does have some narrowed vessels- but risk factor modification is treatment if no anginal symptoms.   # Hypertension- controlled on losartan 100mg , amlodipine 5 mg, ziac 10-6.25 mg  On repeat BP Readings from Last 3 Encounters:  01/16/19 134/78  12/25/18 136/77  10/17/18 114/60   #DM II- controlled last visit on invokamet 15-1000mg   BID, januvia 100mg , and glimepiride 2mg - compliant with these- Update a1c today - he thinks #s are likely up- wants to work on lifestyle if within reason   #Hyperlipidemia- controlled on crestor and lovaza with triglycerides under 200 on last lipids  #Asthma- doing well on symbicort - has been able to space this out and without recent flares. Has to use if rains a lot. Has not used albuterol  #Insomnia- remains on prn sonata- uses most nights   #Anxiety-  still using xanax 4x a week with business meetings/mornings-   #OSA_ started cpap and has made a difference- he will ontinue  this  #has to get follow up chest ct- if stable would be stable for 2 years (slight increase on last check) . Original imaging 2018. Potential hamartoma- he will call to schedule- Dr. Elsworth Soho ordered  6 month follow up if a1c remains well controlled Lab/Order associations: fasting Preventative health care - Plan: CBC, Comprehensive metabolic panel, Lipid panel, PSA, Hemoglobin A1c, T4, free, T3, free  Well controlled type 2 diabetes mellitus ([place]) - Plan: CBC, Comprehensive metabolic panel, Lipid panel, Hemoglobin A1c  Hyperlipidemia associated with type 2 diabetes mellitus ([place])  Hypertension associated with diabetes ([place])  Coronary artery disease involving native coronary artery of native heart without angina pectoris  Screening for prostate cancer - Plan: PSA  Low TSH level - Plan: TSH, T4, free, T3, free  Hamartoma ([facility])  Overweight  No orders of the defined types were placed in this encounter.   Return precautions advised.  [name], MD

## 2019-01-23 DIAGNOSIS — G4733 Obstructive sleep apnea (adult) (pediatric): Secondary | ICD-10-CM | POA: Diagnosis not present

## 2019-01-29 ENCOUNTER — Other Ambulatory Visit: Payer: Self-pay | Admitting: Family Medicine

## 2019-01-31 ENCOUNTER — Other Ambulatory Visit: Payer: Self-pay | Admitting: *Deleted

## 2019-01-31 ENCOUNTER — Encounter: Payer: Self-pay | Admitting: Family Medicine

## 2019-01-31 MED ORDER — ZALEPLON 10 MG PO CAPS: 10.0000 mg | ORAL_CAPSULE | Freq: Every evening | ORAL | 5 refills | Status: DC | PRN

## 2019-01-31 MED ORDER — ADVAIR HFA 45-21 MCG/ACT IN AERO: 2.0000 | INHALATION_SPRAY | Freq: Two times a day (BID) | RESPIRATORY_TRACT | 2 refills | Status: DC

## 2019-01-31 NOTE — Addendum Note (Signed)
Addended by: Marian Sorrow on: 01/31/2019 03:06 PM   Modules accepted: Orders

## 2019-01-31 NOTE — Telephone Encounter (Signed)
Dr. Yong Channel, please send this is a controlled.  I have loaded it again for you and made sure it says normal so it will go through. Thank you.

## 2019-01-31 NOTE — Telephone Encounter (Signed)
Requesting refll on Sonata. Last OV 01/16/2019, Last refill 09/2018 # 30, refill 5. Rx due July no refills.

## 2019-02-02 MED ORDER — ZALEPLON 10 MG PO CAPS: 10.0000 mg | ORAL_CAPSULE | Freq: Every evening | ORAL | 5 refills | Status: DC | PRN

## 2019-02-06 ENCOUNTER — Telehealth: Payer: Self-pay

## 2019-02-06 NOTE — Telephone Encounter (Signed)
Advair HFA 45-21 mcg/act  [email or web address] Key [id number]

## 2019-02-07 ENCOUNTER — Telehealth: Payer: Self-pay | Admitting: *Deleted

## 2019-02-07 NOTE — Telephone Encounter (Signed)

## 2019-02-07 NOTE — Telephone Encounter (Signed)
Initiated via [email or web address]  Preferred alternatives   SYMBICORT INH AEROSOL 80/4.71M  SYMBICORT INH AEROSOL 160/4.5  DULERA INHALER 13GM 100/5  DULERA INHALER 13GM 200/71MC  DULERA INHALER 13GM 50/5

## 2019-02-07 NOTE — Telephone Encounter (Signed)
Larry Garner (Key: [id number])  This request has been approved.  Please note any additional information provided by [facility] at the bottom of your screen.

## 2019-02-08 ENCOUNTER — Other Ambulatory Visit: Payer: Self-pay

## 2019-02-08 ENCOUNTER — Ambulatory Visit (INDEPENDENT_AMBULATORY_CARE_PROVIDER_SITE_OTHER)
Admission: RE | Admit: 2019-02-08 | Discharge: 2019-02-08 | Disposition: A | Discharge status: Home / Self Care | Payer: BC Managed Care – PPO | Source: Ambulatory Visit | Attending: Pulmonary Disease | Admitting: Pulmonary Disease

## 2019-02-08 DIAGNOSIS — R911 Solitary pulmonary nodule: Secondary | ICD-10-CM | POA: Diagnosis not present

## 2019-02-08 MED ORDER — IOHEXOL 300 MG/ML  SOLN
80.0000 mL | Freq: Once | INTRAMUSCULAR | Status: AC | PRN
  Administered 2019-02-08: 80 mL via INTRAVENOUS

## 2019-02-09 ENCOUNTER — Other Ambulatory Visit: Payer: Self-pay | Admitting: Family Medicine

## 2019-02-12 ENCOUNTER — Other Ambulatory Visit: Payer: Self-pay | Admitting: Family Medicine

## 2019-02-13 ENCOUNTER — Other Ambulatory Visit: Payer: Self-pay | Admitting: *Deleted

## 2019-02-13 DIAGNOSIS — R911 Solitary pulmonary nodule: Secondary | ICD-10-CM

## 2019-02-13 NOTE — Progress Notes (Signed)
Ct scan for 1 year out ordered

## 2019-02-18 ENCOUNTER — Other Ambulatory Visit: Payer: Self-pay | Admitting: Family Medicine

## 2019-02-19 ENCOUNTER — Encounter: Payer: Self-pay | Admitting: Family Medicine

## 2019-02-22 DIAGNOSIS — G4733 Obstructive sleep apnea (adult) (pediatric): Secondary | ICD-10-CM | POA: Diagnosis not present

## 2019-02-27 ENCOUNTER — Other Ambulatory Visit: Payer: Self-pay | Admitting: Family Medicine

## 2019-02-28 ENCOUNTER — Other Ambulatory Visit: Payer: Self-pay | Admitting: Family Medicine

## 2019-03-02 DIAGNOSIS — L0889 Other specified local infections of the skin and subcutaneous tissue: Secondary | ICD-10-CM | POA: Diagnosis not present

## 2019-03-02 DIAGNOSIS — L738 Other specified follicular disorders: Secondary | ICD-10-CM | POA: Diagnosis not present

## 2019-03-04 ENCOUNTER — Other Ambulatory Visit: Payer: Self-pay | Admitting: Family Medicine

## 2019-03-14 ENCOUNTER — Other Ambulatory Visit: Payer: Self-pay | Admitting: Family Medicine

## 2019-03-14 ENCOUNTER — Encounter: Payer: Self-pay | Admitting: Family Medicine

## 2019-03-14 MED ORDER — ALPRAZOLAM 0.5 MG PO TABS: 0.5000 mg | ORAL_TABLET | Freq: Four times a day (QID) | ORAL | 5 refills | Status: DC | PRN

## 2019-03-14 NOTE — Telephone Encounter (Signed)
Last OV:  01/16/2019 Next OV:  04/25/2019 Last Fill:  02/12/2019  Please advise.

## 2019-03-25 DIAGNOSIS — G4733 Obstructive sleep apnea (adult) (pediatric): Secondary | ICD-10-CM | POA: Diagnosis not present

## 2019-04-08 ENCOUNTER — Other Ambulatory Visit: Payer: Self-pay | Admitting: Family Medicine

## 2019-04-11 ENCOUNTER — Encounter: Payer: Self-pay | Admitting: Family Medicine

## 2019-04-24 ENCOUNTER — Other Ambulatory Visit: Payer: Self-pay | Admitting: Family Medicine

## 2019-04-24 ENCOUNTER — Encounter: Payer: Self-pay | Admitting: Family Medicine

## 2019-04-25 ENCOUNTER — Ambulatory Visit: Payer: BC Managed Care – PPO | Admitting: Family Medicine

## 2019-04-25 DIAGNOSIS — G4733 Obstructive sleep apnea (adult) (pediatric): Secondary | ICD-10-CM | POA: Diagnosis not present

## 2019-04-26 ENCOUNTER — Ambulatory Visit: Payer: BC Managed Care – PPO | Admitting: Family Medicine

## 2019-05-04 ENCOUNTER — Ambulatory Visit (INDEPENDENT_AMBULATORY_CARE_PROVIDER_SITE_OTHER): Payer: BC Managed Care – PPO

## 2019-05-04 ENCOUNTER — Other Ambulatory Visit: Payer: Self-pay

## 2019-05-04 DIAGNOSIS — Z23 Encounter for immunization: Secondary | ICD-10-CM | POA: Diagnosis not present

## 2019-05-09 DIAGNOSIS — G4733 Obstructive sleep apnea (adult) (pediatric): Secondary | ICD-10-CM | POA: Diagnosis not present

## 2019-05-10 DIAGNOSIS — E119 Type 2 diabetes mellitus without complications: Secondary | ICD-10-CM | POA: Diagnosis not present

## 2019-05-10 LAB — HM DIABETES EYE EXAM

## 2019-05-14 ENCOUNTER — Other Ambulatory Visit: Payer: Self-pay | Admitting: Family Medicine

## 2019-05-24 DIAGNOSIS — H2513 Age-related nuclear cataract, bilateral: Secondary | ICD-10-CM | POA: Diagnosis not present

## 2019-05-24 DIAGNOSIS — H18413 Arcus senilis, bilateral: Secondary | ICD-10-CM | POA: Diagnosis not present

## 2019-05-24 DIAGNOSIS — H2512 Age-related nuclear cataract, left eye: Secondary | ICD-10-CM | POA: Diagnosis not present

## 2019-05-24 DIAGNOSIS — H25043 Posterior subcapsular polar age-related cataract, bilateral: Secondary | ICD-10-CM | POA: Diagnosis not present

## 2019-05-24 DIAGNOSIS — H25013 Cortical age-related cataract, bilateral: Secondary | ICD-10-CM | POA: Diagnosis not present

## 2019-05-25 DIAGNOSIS — G4733 Obstructive sleep apnea (adult) (pediatric): Secondary | ICD-10-CM | POA: Diagnosis not present

## 2019-05-29 ENCOUNTER — Encounter: Payer: Self-pay | Admitting: Family Medicine

## 2019-06-06 ENCOUNTER — Ambulatory Visit: Payer: BC Managed Care – PPO | Admitting: Family Medicine

## 2019-06-06 DIAGNOSIS — H2512 Age-related nuclear cataract, left eye: Secondary | ICD-10-CM | POA: Diagnosis not present

## 2019-06-06 HISTORY — PX: CATARACT EXTRACTION: SUR2

## 2019-06-07 DIAGNOSIS — H2511 Age-related nuclear cataract, right eye: Secondary | ICD-10-CM | POA: Diagnosis not present

## 2019-06-14 ENCOUNTER — Ambulatory Visit: Payer: BC Managed Care – PPO | Admitting: Family Medicine

## 2019-07-09 ENCOUNTER — Other Ambulatory Visit: Payer: Self-pay

## 2019-07-09 ENCOUNTER — Ambulatory Visit (INDEPENDENT_AMBULATORY_CARE_PROVIDER_SITE_OTHER): Payer: BC Managed Care – PPO | Admitting: Family Medicine

## 2019-07-09 ENCOUNTER — Encounter: Payer: Self-pay | Admitting: Family Medicine

## 2019-07-09 VITALS — BP 126/84 | HR 67 | Temp 97.9°F | Ht 71.0 in | Wt 212.0 lb

## 2019-07-09 DIAGNOSIS — F439 Reaction to severe stress, unspecified: Secondary | ICD-10-CM

## 2019-07-09 DIAGNOSIS — E1165 Type 2 diabetes mellitus with hyperglycemia: Secondary | ICD-10-CM

## 2019-07-09 DIAGNOSIS — I10 Essential (primary) hypertension: Secondary | ICD-10-CM

## 2019-07-09 DIAGNOSIS — E785 Hyperlipidemia, unspecified: Secondary | ICD-10-CM

## 2019-07-09 LAB — POCT GLYCOSYLATED HEMOGLOBIN (HGB A1C): Hemoglobin A1C: 9 % — AB (ref 4.0–5.6)

## 2019-07-09 NOTE — Progress Notes (Signed)
Phone [phone] In person visit   Subjective:   Larry Garner is a 63 y.o. year old very pleasant male patient who presents for/with See problem oriented charting Chief Complaint  Patient presents with  . Follow-up    diabetes    ROS- Review of Systems  Constitutional: Negative.   HENT: Negative.   Eyes: Negative.   Respiratory: Positive for wheezing (allergies).   Cardiovascular: Negative.   Gastrointestinal: Negative.   Genitourinary: Negative.   Musculoskeletal: Negative.   Skin: Negative.   Neurological: Negative.   Endo/Heme/Allergies: Positive for environmental allergies.  Psychiatric/Behavioral: Negative.      This visit occurred during the SARS-CoV-2 public health emergency.  Safety protocols were in place, including screening questions prior to the visit, additional usage of staff PPE, and extensive cleaning of exam room while observing appropriate contact time as indicated for disinfecting solutions.   Past Medical History-  Patient Active Problem List   Diagnosis Date Noted  . Well controlled type 2 diabetes mellitus ([place]) 03/31/2010    Priority: High  . Coronary artery disease involving native coronary artery of native heart without angina pectoris 04/06/2007    Priority: High  . Insomnia 05/27/2014    Priority: Medium  . Asthma, moderate persistent 06/19/2008    Priority: Medium  . ERECTILE DYSFUNCTION, SECONDARY TO MEDICATION 05/17/2007    Priority: Medium  . Hyperlipidemia 04/06/2007    Priority: Medium  . Anxiety state 04/06/2007    Priority: Medium  . Essential hypertension 04/06/2007    Priority: Medium  . Former smoker 11/04/2014    Priority: Low  . Plantar wart of left foot 11/27/2010    Priority: Low  . PSA, INCREASED 09/15/2009    Priority: Low  . Diverticulitis of colon 05/08/2009    Priority: Low  . ANKLE PAIN, CHRONIC 06/05/2007    Priority: Low  . Allergic rhinitis 05/17/2007    Priority: Low  . OSA (obstructive sleep apnea)  01/09/2018  . Hamartoma ([place]) 03/15/2017  . Abnormal nuclear stress test 07/20/2016    Medications- reviewed and updated Current Outpatient Medications  Medication Sig Dispense Refill  . albuterol (PROVENTIL HFA;VENTOLIN HFA) 108 (90 Base) MCG/ACT inhaler Inhale 2 puffs into the lungs every 6 (six) hours as needed for wheezing or shortness of breath. 1 Inhaler 5  . ALPRAZolam (XANAX) 0.5 MG tablet Take 1 tablet (0.5 mg total) by mouth every 6 (six) hours as needed. 30 tablet 5  . amLODipine (NORVASC) 5 MG tablet TAKE 1 TABLET BY MOUTH EVERY DAY 90 tablet 1  . aspirin EC 81 MG tablet Take 1 tablet (81 mg total) by mouth daily. 90 tablet 3  . Azelastine-Fluticasone (DYMISTA) 137-50 MCG/ACT SUSP 1 spray each nostril bid (Patient taking differently: Place 1 spray into both nostrils daily as needed (congestion). ) 1 Bottle 5  . bisoprolol-hydrochlorothiazide (ZIAC) 10-6.25 MG tablet TAKE 1 TABLET BY MOUTH DAILY 30 tablet 2  . famciclovir (FAMVIR) 500 MG tablet TAKE 3 TABLETS BY MOUTH EVERY DAY AS NEEDED AT FIRST SIGN OF OUTBREAK OF FEVER BLISTER 15 tablet 1  . fluticasone-salmeterol (ADVAIR HFA) 45-21 MCG/ACT inhaler Inhale 2 puffs into the lungs 2 (two) times daily. 1 Inhaler 2  . folic acid (FOLVITE) [id number] MCG tablet Take 1,600 mcg by mouth daily.      Marland Kitchen glimepiride (AMARYL) 2 MG tablet TAKE 1 TABLET (2 MG TOTAL) BY MOUTH DAILY BEFORE BREAKFAST. 90 tablet 3  . glucose blood (BAYER CONTOUR NEXT TEST) test strip Use to test  blood sugars daily. Dx: E11.9 100 each 12  . INVOKAMET [id number] MG TABS TAKE 1 TABLET TWICE A DAY 180 tablet 3  . JANUVIA 100 MG tablet TAKE 1 TABLET BY MOUTH EVERY DAY 90 tablet 1  . losartan (COZAAR) 100 MG tablet TAKE 1 TABLET BY MOUTH EVERY DAY 90 tablet 3  . Multiple Vitamin (MULTIVITAMIN) tablet Take 1 tablet by mouth daily.      . nitroGLYCERIN (NITROSTAT) 0.4 MG SL tablet PLACE 1 TABLET UNDER THE TONGUE EVERY 5 MINUTES AS NEEDED FOR CHEST PAIN. 25 tablet 1  . omega-3 acid  ethyl esters (LOVAZA) 1 g capsule TAKE 1 CAPSULE THREE TIMES A DAY 90 capsule 12  . oxymetazoline (AFRIN) 0.05 % nasal spray Place 1 spray into both nostrils 2 (two) times daily as needed for congestion.    . rosuvastatin (CRESTOR) 20 MG tablet TAKE 1 TABLET BY MOUTH EVERY DAY 90 tablet 3  . zaleplon (SONATA) 10 MG capsule Take 1 capsule (10 mg total) by mouth at bedtime as needed. for sleep 30 capsule 5   No current facility-administered medications for this visit.      Objective:  BP 126/84 (BP Location: Left Arm, Patient Position: Sitting, Cuff Size: Large)   Pulse 67   Temp 97.9 F (36.6 C) (Temporal)   Ht 5\' 11"  (1.803 m)   Wt 212 lb (96.2 kg)   SpO2 96%   BMI 29.57 kg/m  Gen: NAD, resting comfortably CV: RRR no murmurs rubs or gallops Lungs: CTAB no crackles, wheeze, rhonchi Ext: no edema Skin: warm, dry     Assessment and Plan   # Diabetes S: Compliant with invokamet [id number] mg twice a day, Januvia 100 mg, glimepiride 2 mg. Exercise and diet- feels like he has not done a good job mainly with drinking sweets- tends to be sweet tea (otherwise food choices are ok) - has found some better substitutes. Feels like doing slightly better job with exercise.  Lab Results  Component Value Date   HGBA1C 9.0 (A) poc 07/09/2019   HGBA1C 9.4 (H) phleb 01/16/2019   HGBA1C 6.9 (A) 04/11/2018   A/P: Remains very poorly controlled-recommended we consider changing Januvia to Ozempic.  Patient declines for now-he would like to give this another 14-week trial with healthy eating and regular exercise and will consider change if not making improvement by follow-up.   #hyperlipidemia S: compliant with Crestor 20 mg Lab Results  Component Value Date   CHOL 113 01/16/2019   HDL 28.60 (L) 01/16/2019   LDLCALC 40 11/14/2017   LDLDIRECT 54.0 01/16/2019   TRIG 282.0 (H) 01/16/2019   CHOLHDL 4 01/16/2019   A/P: Reasonably controlled with last LDL under 70-do need to work on triglycerides by  controlling blood sugar better.  Update lipid panel January 16 2020 or later   #hypertension S: compliant with Ziac, amlodipine, losartan-at above doses BP Readings from Last 3 Encounters:  07/09/19 126/84  01/16/19 134/78  12/25/18 136/77  A/P:  Stable. Continue current medications.     #Anxiety/elevated PHQ 9-patient continues to use Xanax and finds this extremely helpful.  We have opted to continue for now due to stressful job-job continues to be the most stressful point in his career with COVID-19 and work bleeding over into the home.  He has seen a counselor in the past around the time of his bypass but is not interested at this time.  I did provide some counseling today-if he has worsening symptoms he can certainly follow-up with  [place] sooner than next visit.  Recommended follow up: Return in about 14 weeks (around 10/15/2019) for follow up- or sooner if needed. Future Appointments  Date Time Provider [place]  07/30/2019  9:30 AM Isaiah Serge, NP [facility]  10/23/2019  8:00 AM Yong Channel Brayton Mars, MD [facility]    Lab/Order associations:   ICD-10-CM   1. Poorly controlled type 2 diabetes mellitus ([facility]CC)  E11.65 POCT HgB A1C  2. Hyperlipidemia, unspecified hyperlipidemia type  E78.5   3. Essential hypertension  I10   4. Stress  F43.9    Return precautions advised.  [name], MD

## 2019-07-09 NOTE — Assessment & Plan Note (Signed)
S: Compliant with invokamet [phone] mg twice a day, Januvia 100 mg, glimepiride 2 mg. Exercise and diet- feels like he has not done a good job mainly with drinking sweets- tends to be sweet tea (otherwise food choices are ok) - has found some better substitutes. Feels like doing slightly better job with exercise.  Lab Results  Component Value Date   HGBA1C 9.0 (A) poc 07/09/2019   HGBA1C 9.4 (H) phleb 01/16/2019   HGBA1C 6.9 (A) 04/11/2018   A/P: Remains very poorly controlled-recommended we consider changing Januvia to Ozempic.  Patient declines for now-he would like to give this another 14-week trial with healthy eating and regular exercise and will consider change if not making improvement by follow-up.

## 2019-07-09 NOTE — Patient Instructions (Addendum)
Plan for now is to buckle down over next 14 weeks and schedule visit at that time so we can recheck a1c.   No other changes today  Recommended follow up: Return in about 14 weeks (around 10/15/2019) for follow up- or sooner if needed.

## 2019-07-18 DIAGNOSIS — H2511 Age-related nuclear cataract, right eye: Secondary | ICD-10-CM | POA: Diagnosis not present

## 2019-07-20 ENCOUNTER — Other Ambulatory Visit: Payer: Self-pay | Admitting: Family Medicine

## 2019-07-24 DIAGNOSIS — G4733 Obstructive sleep apnea (adult) (pediatric): Secondary | ICD-10-CM | POA: Diagnosis not present

## 2019-07-26 NOTE — Progress Notes (Signed)
Cardiology Office Note   Date:  07/30/2019   ID:  Larry, Garner March 07, 1956, MRN [id number]  PCP:  Larry Olp, MD  Cardiologist:  Dr. Johnsie Cancel, MD   Chief Complaint  Patient presents with  . Follow-up     History of Present Illness: Larry Garner is a 63 y.o. male who presents for 1 year follow up, seen for Dr. Johnsie Garner.   [name] has a hx of CAD s/p PCI to LAD with restenosis, subsequently requiring CABG 06/19/1999. Last cath in 2017 with total occlusion in LAD stent, total occlusion of OM #1/ramus intermediate, 60-70% 1st diag, total occlusion of native RCA with LVEF of 45-50%. Recommendations at that time were for aggressive risk factor modification given the absence of CV symptoms. No CTO recanalization was not pursued.    He was last seen by Dr. Johnsie Garner in 07/2018 and was doing quite well from a cardiac standpoint.   [name] presents for follow up and is doing very well from a cardiac perspective. He denies chest pain, SOB, LE swelling, palpitations or syncope. He states he continues to walk 2 miles with his wife per day when he is working from home without anginal symptoms. 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 Would like any maintenance testing to be performed prior to his retirement if needed.  Last cardiac cath to follow his 64 year old grafts was in 2017. Follows closely with his PCP without concern.  BP is stable today at 140/76.  Has not needed SL NTG.  Past Medical History:  Diagnosis Date  . Allergic rhinitis   . Allergy   . Anxiety   . Asthma   . Diabetes ([place])   . Diverticulitis   . GERD (gastroesophageal reflux disease)    very occ  . History of heart attack   . HLD (hyperlipidemia)   . HTN (hypertension)   . Myocardial infarction ([place]) 2000  . Sleep apnea    wears cpap   . Tubular adenoma of colon 09/2008    Past Surgical History:  Procedure Laterality Date  . CARDIAC CATHETERIZATION N/A 07/20/2016    Procedure: Left Heart Cath and Cors/Grafts Angiography;  Surgeon: Larry Crome, MD;  Location: [facility];  Service: Cardiovascular;  Laterality: N/A;  . CATARACT EXTRACTION Left 06/06/2019   Dr. Tommy Garner  . COLONOSCOPY    . CORONARY ANGIOPLASTY    . CORONARY ARTERY BYPASS GRAFT  06/1999  . POLYPECTOMY    . TONSILLECTOMY     late50's early 27's     Current Outpatient Medications  Medication Sig Dispense Refill  . albuterol (PROVENTIL HFA;VENTOLIN HFA) 108 (90 Base) MCG/ACT inhaler Inhale 2 puffs into the lungs every 6 (six) hours as needed for wheezing or shortness of breath. 1 Inhaler 5  . ALPRAZolam (XANAX) 0.5 MG tablet Take 1 tablet (0.5 mg total) by mouth every 6 (six) hours as needed. 30 tablet 5  . amLODipine (NORVASC) 5 MG tablet TAKE 1 TABLET BY MOUTH EVERY DAY 90 tablet 1  . aspirin EC 81 MG tablet Take 1 tablet (81 mg total) by mouth daily. 90 tablet 3  . Azelastine-Fluticasone (DYMISTA) 137-50 MCG/ACT SUSP 1 spray each nostril bid (Patient taking differently: Place 1 spray into both nostrils daily as needed (congestion). ) 1 Bottle 5  . bisoprolol-hydrochlorothiazide (ZIAC) 10-6.25 MG tablet TAKE 1 TABLET BY MOUTH DAILY 30 tablet 2  . famciclovir (FAMVIR) 500 MG tablet TAKE  3 TABLETS BY MOUTH EVERY DAY AS NEEDED AT FIRST SIGN OF OUTBREAK OF FEVER BLISTER 15 tablet 1  . fluticasone-salmeterol (ADVAIR HFA) 45-21 MCG/ACT inhaler Inhale 2 puffs into the lungs 2 (two) times daily. 1 Inhaler 2  . folic acid (FOLVITE) [id number] MCG tablet Take 1,600 mcg by mouth daily.      Marland Kitchen glimepiride (AMARYL) 2 MG tablet TAKE 1 TABLET (2 MG TOTAL) BY MOUTH DAILY BEFORE BREAKFAST. 90 tablet 3  . glucose blood (BAYER CONTOUR NEXT TEST) test strip Use to test blood sugars daily. Dx: E11.9 100 each 12  . INVOKAMET [phone] MG TABS TAKE 1 TABLET TWICE A DAY 180 tablet 3  . JANUVIA 100 MG tablet TAKE 1 TABLET BY MOUTH EVERY DAY 90 tablet 1  . losartan (COZAAR) 100 MG tablet TAKE 1 TABLET BY MOUTH EVERY  DAY 90 tablet 3  . Multiple Vitamin (MULTIVITAMIN) tablet Take 1 tablet by mouth daily.      . nitroGLYCERIN (NITROSTAT) 0.4 MG SL tablet PLACE 1 TABLET UNDER THE TONGUE EVERY 5 MINUTES AS NEEDED FOR CHEST PAIN. 25 tablet 1  . omega-3 acid ethyl esters (LOVAZA) 1 g capsule TAKE 1 CAPSULE THREE TIMES A DAY 90 capsule 12  . oxymetazoline (AFRIN) 0.05 % nasal spray Place 1 spray into both nostrils 2 (two) times daily as needed for congestion.    . rosuvastatin (CRESTOR) 20 MG tablet TAKE 1 TABLET BY MOUTH EVERY DAY 90 tablet 3  . zaleplon (SONATA) 10 MG capsule Take 1 capsule (10 mg total) by mouth at bedtime as needed. for sleep 30 capsule 5   No current facility-administered medications for this visit.    Allergies:   Morphine sulfate    Social History:  The patient  reports that he quit smoking about 41 years ago. His smoking use included cigarettes. He has a 2.00 pack-year smoking history. He has never used smokeless tobacco. He reports current alcohol use of about 1.0 standard drinks of alcohol per week. He reports that he does not use drugs.   Family History:  The patient's family history includes Alzheimer's disease in his mother; Colon cancer in his paternal aunt; Colon polyps in his father and mother; Hyperlipidemia in his father; Hypertension in his father and mother.    ROS:  Please see the history of present illness. Otherwise, review of systems are positive for none.   All other systems are reviewed and negative.    PHYSICAL EXAM: VS:  There were no vitals taken for this visit. , BMI There is no height or weight on file to calculate BMI.   General: Well developed, well nourished, NAD Neck: Negative for carotid bruits. No JVD Lungs:Clear to ausculation bilaterally. No wheezes, rales, or rhonchi. Breathing is unlabored. Cardiovascular: RRR with S1 S2. No murmurs, rubs, gallops, or LV heave appreciated. Extremities: No edema.  Neuro: Alert and oriented. No focal deficits. No  facial asymmetry. MAE spontaneously. Psych: Responds to questions appropriately with normal affect.     EKG:  EKG is ordered today. The ekg ordered today demonstrates NSR with diffuse T wave abnormalities and incomplete bundle. HR 64bpm   Recent Labs: 01/16/2019: ALT 18; BUN 19; Creatinine, Ser 0.87; Hemoglobin 15.7; Platelets 250.0; Potassium 3.9; Sodium 138; TSH 0.20    Lipid Panel    Component Value Date/Time   CHOL 113 01/16/2019 0930   TRIG 282.0 (H) 01/16/2019 0930   HDL 28.60 (L) 01/16/2019 0930   CHOLHDL 4 01/16/2019 0930   VLDL 56.4 (H)  01/16/2019 0930   LDLCALC 40 11/14/2017 0911   LDLDIRECT 54.0 01/16/2019 0930     Wt Readings from Last 3 Encounters:  07/09/19 212 lb (96.2 kg)  01/16/19 211 lb 6.6 oz (95.9 kg)  12/25/18 220 lb (99.8 kg)    Other studies Reviewed: Additional studies/ records that were reviewed today include:   LHC 07/20/2016:   The left ventricular systolic function is normal.  The left ventricular ejection fraction is 45-50% by visual estimate.  There is no mitral valve regurgitation.  Ost 1st Diag to 1st Diag lesion, 65 %stenosed.  Mid LAD lesion, 100 %stenosed.  SVG and is normal in caliber.  SVG.  LIMA.  Dist LAD lesion, 50 %stenosed.  Ost 2nd Diag to 2nd Diag lesion, 100 %stenosed.  Ramus lesion, 100 %stenosed.  Mid RCA lesion, 100 %stenosed.  Prox Graft to Dist Graft lesion, 20 %stenosed.  Origin lesion, 40 %stenosed.    Widely patent previously placed bypass grafts including LIMA to mid LAD, saphenous graft to diagonal, and saphenous vein graft to obtuse marginal #1/ramus intermedius.  Total occlusion of the proximal LAD in stent.  Total occlusion of the obtuse marginal #1/ramus intermedius  60-70% stenosis in the native first diagonal  Total occlusion of the native mid right coronary collateralized by both right to right and left-to-right collaterals. The right coronary is dominant.  Normal left ventricular  hemodynamics. LVEF 45-50 %.  Recommendations:   Continue aggressive risk factor modification.  In absence of cardiac symptoms, medical therapy seems most appropriate. Therefore, CTO recanalization will not be pursued..   ASSESSMENT AND PLAN:  1. CAD s/p CABG: -Last cath 2017 as above  -Can consider CTO of RCA if refractory angina>> denies recurrent symptoms -Continues to exercise with walking 2 miles per day without anginal symptoms -Continue ASA, bisoprolol-HCTZ, losartan   2. HLD: -Last LDL, 40 in 11/2017 -Continue statin  -Labs per PCP  3. HTN: -Stable, 140/76 -Continue current regimen -Labs per PCP  4. DM2: -Target HbA1c 6.5 or less -Last HbA1c, 9.0 on 07/09/2019 -On Januvia    Current medicines are reviewed at length with the patient today.  The patient does not have concerns regarding medicines.  The following changes have been made:  no change  Labs/ tests ordered today include: None  No orders of the defined types were placed in this encounter.   Disposition:   FU with Dr.Nishan In 9 months    Signed, Kathyrn Drown, NP  07/30/2019 9:18 AM    [facility] [address], [place], [place]  [address] Phone: [phone]; Fax: [phone]

## 2019-07-30 ENCOUNTER — Other Ambulatory Visit: Payer: Self-pay

## 2019-07-30 ENCOUNTER — Ambulatory Visit (INDEPENDENT_AMBULATORY_CARE_PROVIDER_SITE_OTHER): Payer: BC Managed Care – PPO | Admitting: Cardiology

## 2019-07-30 ENCOUNTER — Ambulatory Visit: Payer: BC Managed Care – PPO | Admitting: Cardiology

## 2019-07-30 ENCOUNTER — Encounter: Payer: Self-pay | Admitting: Cardiology

## 2019-07-30 VITALS — BP 140/76 | HR 66 | Ht 71.0 in | Wt 211.2 lb

## 2019-07-30 DIAGNOSIS — E785 Hyperlipidemia, unspecified: Secondary | ICD-10-CM | POA: Diagnosis not present

## 2019-07-30 DIAGNOSIS — I25708 Atherosclerosis of coronary artery bypass graft(s), unspecified, with other forms of angina pectoris: Secondary | ICD-10-CM | POA: Diagnosis not present

## 2019-07-30 DIAGNOSIS — Z951 Presence of aortocoronary bypass graft: Secondary | ICD-10-CM

## 2019-07-30 DIAGNOSIS — I10 Essential (primary) hypertension: Secondary | ICD-10-CM

## 2019-07-30 NOTE — Patient Instructions (Signed)
Medication Instructions:   Your physician recommends that you continue on your current medications as directed. Please refer to the Current Medication list given to you today.  *If you need a refill on your cardiac medications before your next appointment, please call your pharmacy*  Lab Work:  None ordered today  Testing/Procedures:  None ordered today  Follow-Up: At [facility], you and your health needs are our priority.  As part of our continuing mission to provide you with exceptional heart care, we have created designated Provider Care Teams.  These Care Teams include your primary Cardiologist (physician) and Advanced Practice Providers (APPs -  Physician Assistants and Nurse Practitioners) who all work together to provide you with the care you need, when you need it.  Your next appointment:   9 month(s)  The format for your next appointment:   Either In Person or Virtual  Provider:   You may see Jenkins Rouge, MD or one of the following Advanced Practice Providers on your designated Care Team:    Truitt Merle, NP  Cecilie Kicks, NP  Kathyrn Drown, NP

## 2019-08-07 ENCOUNTER — Encounter: Payer: Self-pay | Admitting: Family Medicine

## 2019-08-07 DIAGNOSIS — G4733 Obstructive sleep apnea (adult) (pediatric): Secondary | ICD-10-CM | POA: Diagnosis not present

## 2019-08-08 ENCOUNTER — Other Ambulatory Visit: Payer: Self-pay | Admitting: Family Medicine

## 2019-08-13 ENCOUNTER — Other Ambulatory Visit: Payer: Self-pay | Admitting: Family Medicine

## 2019-08-13 NOTE — Telephone Encounter (Signed)
Pt requesting refill. Last OV 07/09/2019.

## 2019-08-16 ENCOUNTER — Ambulatory Visit: Payer: BC Managed Care – PPO | Admitting: Cardiology

## 2019-08-31 ENCOUNTER — Other Ambulatory Visit: Payer: Self-pay | Admitting: Family Medicine

## 2019-09-05 ENCOUNTER — Encounter: Payer: Self-pay | Admitting: Family Medicine

## 2019-09-05 ENCOUNTER — Other Ambulatory Visit: Payer: Self-pay | Admitting: Family Medicine

## 2019-09-05 NOTE — Telephone Encounter (Signed)
Requesting refill for Alprazolam. Last OV 06/2019.

## 2019-09-09 ENCOUNTER — Ambulatory Visit: Payer: BC Managed Care – PPO

## 2019-09-14 ENCOUNTER — Ambulatory Visit: Payer: BC Managed Care – PPO

## 2019-09-15 ENCOUNTER — Ambulatory Visit: Payer: BC Managed Care – PPO | Attending: Internal Medicine

## 2019-09-15 DIAGNOSIS — Z23 Encounter for immunization: Secondary | ICD-10-CM | POA: Insufficient documentation

## 2019-09-15 NOTE — Progress Notes (Signed)
   Covid-19 Vaccination Clinic  Name:  Larry Garner    MRN: [id number] DOB: 28-May-1956  09/15/2019  Larry Garner was observed post Covid-19 immunization for 15 minutes without incidence. He was provided with Vaccine Information Sheet and instruction to access the V-Safe system.   Larry Garner was instructed to call 911 with any severe reactions post vaccine: Marland Kitchen Difficulty breathing  . Swelling of your face and throat  . A fast heartbeat  . A bad rash all over your body  . Dizziness and weakness    Immunizations Administered    Name Date Dose VIS Date Route   Pfizer COVID-19 Vaccine 09/15/2019 10:33 AM 0.3 mL 07/20/2019 Intramuscular   Manufacturer: [place]   Lot: [id number]   [place]: [id number]

## 2019-10-01 ENCOUNTER — Other Ambulatory Visit: Payer: Self-pay

## 2019-10-01 ENCOUNTER — Other Ambulatory Visit: Payer: Self-pay | Admitting: Family Medicine

## 2019-10-01 ENCOUNTER — Encounter: Payer: Self-pay | Admitting: Family Medicine

## 2019-10-01 MED ORDER — OMEGA-3-ACID ETHYL ESTERS 1 G PO CAPS: 1.0000 | ORAL_CAPSULE | Freq: Three times a day (TID) | ORAL | 3 refills | Status: DC

## 2019-10-09 ENCOUNTER — Ambulatory Visit: Payer: BC Managed Care – PPO

## 2019-10-09 ENCOUNTER — Ambulatory Visit: Payer: BC Managed Care – PPO | Attending: Internal Medicine

## 2019-10-09 DIAGNOSIS — Z23 Encounter for immunization: Secondary | ICD-10-CM

## 2019-10-09 NOTE — Progress Notes (Signed)
   Covid-19 Vaccination Clinic  Name:  Larry Garner    MRN: [id number] DOB: [birth year]  10/09/2019  [name] was observed post Covid-19 immunization for 15 minutes without incident. He was provided with Vaccine Information Sheet and instruction to access the V-Safe system.   [name] was instructed to call 911 with any severe reactions post vaccine: Marland Kitchen Difficulty breathing  . Swelling of face and throat  . A fast heartbeat  . A bad rash all over body  . Dizziness and weakness   Immunizations Administered    Name Date Dose VIS Date Route   Pfizer COVID-19 Vaccine 10/09/2019  3:54 PM 0.3 mL 07/20/2019 Intramuscular   Manufacturer: [place]   Lot: [id number]   [place]: [id number]

## 2019-10-22 NOTE — Progress Notes (Signed)
Phone [phone] In person visit   Subjective:   Larry Garner is a 64 y.o. year old very pleasant male patient who presents for/with See problem oriented charting Chief Complaint  Patient presents with  . Diabetes   This visit occurred during the SARS-CoV-2 public health emergency.  Safety protocols were in place, including screening questions prior to the visit, additional usage of staff PPE, and extensive cleaning of exam room while observing appropriate contact time as indicated for disinfecting solutions.   Past Medical History-  Patient Active Problem List   Diagnosis Date Noted  . Well controlled type 2 diabetes mellitus ([place]) 03/31/2010    Priority: High  . Coronary artery disease involving native coronary artery of native heart without angina pectoris 04/06/2007    Priority: High  . Insomnia 05/27/2014    Priority: Medium  . Asthma, moderate persistent 06/19/2008    Priority: Medium  . ERECTILE DYSFUNCTION, SECONDARY TO MEDICATION 05/17/2007    Priority: Medium  . Hyperlipidemia 04/06/2007    Priority: Medium  . Anxiety state 04/06/2007    Priority: Medium  . Essential hypertension 04/06/2007    Priority: Medium  . Former smoker 11/04/2014    Priority: Low  . Plantar wart of left foot 11/27/2010    Priority: Low  . PSA, INCREASED 09/15/2009    Priority: Low  . Diverticulitis of colon 05/08/2009    Priority: Low  . ANKLE PAIN, CHRONIC 06/05/2007    Priority: Low  . Allergic rhinitis 05/17/2007    Priority: Low  . OSA (obstructive sleep apnea) 01/09/2018  . Hamartoma ([place]) 03/15/2017  . Abnormal nuclear stress test 07/20/2016    Medications- reviewed and updated Current Outpatient Medications  Medication Sig Dispense Refill  . albuterol (PROVENTIL HFA;VENTOLIN HFA) 108 (90 Base) MCG/ACT inhaler Inhale 2 puffs into the lungs every 6 (six) hours as needed for wheezing or shortness of breath. 1 Inhaler 5  . ALPRAZolam (XANAX) 0.5 MG tablet TAKE 1 TABLET BY  MOUTH EVERY 6 HOURS AS NEEDED 30 tablet 1  . amLODipine (NORVASC) 5 MG tablet TAKE 1 TABLET BY MOUTH EVERY DAY 90 tablet 1  . aspirin EC 81 MG tablet Take 1 tablet (81 mg total) by mouth daily. 90 tablet 3  . Azelastine-Fluticasone 137-50 MCG/ACT SUSP Place 1 spray into the nose as needed.    . bisoprolol-hydrochlorothiazide (ZIAC) 10-6.25 MG tablet TAKE 1 TABLET BY MOUTH EVERY DAY 90 tablet 0  . famciclovir (FAMVIR) 500 MG tablet TAKE 3 TABLETS BY MOUTH EVERY DAY AS NEEDED AT FIRST SIGN OF OUTBREAK OF FEVER BLISTER 15 tablet 1  . fluticasone-salmeterol (ADVAIR HFA) 45-21 MCG/ACT inhaler Inhale 2 puffs into the lungs 2 (two) times daily. 1 Inhaler 2  . folic acid (FOLVITE) Q000111Q MCG tablet Take 1,600 mcg by mouth daily.      Marland Kitchen glimepiride (AMARYL) 2 MG tablet TAKE 1 TABLET (2 MG TOTAL) BY MOUTH DAILY BEFORE BREAKFAST. 90 tablet 3  . glucose blood (BAYER CONTOUR NEXT TEST) test strip Use to test blood sugars daily. Dx: E11.9 100 each 12  . INVOKAMET [phone] MG TABS TAKE 1 TABLET TWICE A DAY 180 tablet 3  . JANUVIA 100 MG tablet TAKE 1 TABLET BY MOUTH EVERY DAY 90 tablet 1  . losartan (COZAAR) 100 MG tablet TAKE 1 TABLET BY MOUTH EVERY DAY 90 tablet 3  . Multiple Vitamin (MULTIVITAMIN) tablet Take 1 tablet by mouth daily.      . nitroGLYCERIN (NITROSTAT) 0.4 MG SL tablet PLACE 1  TABLET UNDER THE TONGUE EVERY 5 MINUTES AS NEEDED FOR CHEST PAIN. 25 tablet 1  . omega-3 acid ethyl esters (LOVAZA) 1 g capsule TAKE 1 CAPSULE THREE TIMES A DAY 90 capsule 11  . oxymetazoline (AFRIN) 0.05 % nasal spray Place 1 spray into both nostrils 2 (two) times daily as needed for congestion.    . rosuvastatin (CRESTOR) 20 MG tablet TAKE 1 TABLET BY MOUTH EVERY DAY 90 tablet 3  . zaleplon (SONATA) 10 MG capsule TAKE 1 CAPSULE (10 MG TOTAL) BY MOUTH AT BEDTIME AS NEEDED. FOR SLEEP 30 capsule 5   No current facility-administered medications for this visit.     Objective:  BP 138/82   Pulse 74   Temp 98.7 F (37.1 C)  (Temporal)   Ht 5\' 11"  (1.803 m)   Wt 207 lb (93.9 kg)   SpO2 94%   BMI 28.87 kg/m  Gen: NAD, resting comfortably CV: RRR no murmurs rubs or gallops Lungs: CTAB no crackles, wheeze, rhonchi Ext: no edema Skin: warm, dry    Assessment and Plan   # Diabetes  S:Compliant with invokamet [phone] mg twice a day, Januvia 100 mg, glimepiride 2 mg. At last visit recommended we consider changing Januvia to [place]. Patient decline wanted to try 14 more weeks of diet and exercises. He currently has not been exercising and feels like his A1C will be high. Would like to talk about starting new medication.   - forsees exercise doing better- doesn't do as well in cold months. Still a lot of stress at work which makes lifestyle changes harder -eating better and down 5 lbs Lab Results  Component Value Date   HGBA1C poc 8.3 (A) 10/23/2019  A/P: Mild improvement this visit - he wants to continue to work on lifestyle changes as he feels this is the time of the year he tends to do best- hopefully we can pick up this momentum and carry it through the year -long term goal under 200  # Hyperlipidemia S: compliant with Crestor 20 mg Lab Results  Component Value Date   CHOL 113 01/16/2019   HDL 28.60 (L) 01/16/2019   LDLCALC 40 11/14/2017   LDLDIRECT 54.0 01/16/2019   TRIG 282.0 (H) 01/16/2019   CHOLHDL 4 01/16/2019  A/P: Stable. Continue current medications.    # hypertension S:compliant with [place]iac 10-6.25mg , amlodipine 5 mg, losartan 100mg  BP Readings from Last 3 Encounters:  10/23/19 138/82  07/30/19 140/76  07/09/19 126/84  A/P: high normal on repeat today. He has had a few other checks right around 140- hoping this trends down as he works on more regular exercise. Could consider tweaking meds as would prefer <130/80 if not lightheaded with this.    # Anxiety/elevated PHQ 9  S:Compliant with Xanax0.5 mg.  Phq 9 done in office today much improved- stress at work is better- he is tolerating it  better.  A/P: improving but we are going to continue xanax at least through retirement whenever he decides to do this- thinking about 2 more years - also has had slightly low tsh in past but normal t3 and t4  Recommended follow up: Return in about 4 months (around 02/22/2020) for physical or sooner if needed.  Lab/Order associations:   ICD-10-CM   1. Poorly controlled type 2 diabetes mellitus (HCC)  E11.65 POCT HgB A1C    Comprehensive metabolic panel    CBC  2. Well controlled type 2 diabetes mellitus ([place])  E11.9   3. Hyperlipidemia, unspecified hyperlipidemia type  E78.5   4. Essential hypertension  I10   5. Anxiety state  F41.1   6. Low TSH level  R79.89 TSH    T4, free    T3, free   Return precautions advised.  [name], MD

## 2019-10-22 NOTE — Patient Instructions (Addendum)
[email or web address] and search under high blood pressure. Consider doing some home monitoring at least every few weeks- id like to see how your #s trend as you work on increasing exercise  Lets continue focusing on regular exercise and healthy eating to help get a1c down further- I would estimate you would be around 8.8 today but that's still improved from 9. If not improving next visit consider switch to ozempic  Please stop by lab before you go If you do not have mychart- we will call you about results within 5 business days of [place] receiving them.  If you have mychart- we will send your results within 3 business days of [place] receiving them.  If abnormal or we want to clarify a result, we will call or mychart you to make sure you receive the message.  If you have questions or concerns or don't hear within 5 business days, please send [place] a message or call us.   Recommended follow up: Return in about 4 months (around 02/22/2020) for physical or sooner if needed.

## 2019-10-23 ENCOUNTER — Ambulatory Visit (INDEPENDENT_AMBULATORY_CARE_PROVIDER_SITE_OTHER): Payer: BC Managed Care – PPO | Admitting: Family Medicine

## 2019-10-23 ENCOUNTER — Other Ambulatory Visit: Payer: Self-pay

## 2019-10-23 ENCOUNTER — Encounter: Payer: Self-pay | Admitting: Family Medicine

## 2019-10-23 VITALS — BP 138/82 | HR 74 | Temp 98.7°F | Ht 71.0 in | Wt 207.0 lb

## 2019-10-23 DIAGNOSIS — I10 Essential (primary) hypertension: Secondary | ICD-10-CM

## 2019-10-23 DIAGNOSIS — E119 Type 2 diabetes mellitus without complications: Secondary | ICD-10-CM | POA: Diagnosis not present

## 2019-10-23 DIAGNOSIS — E1165 Type 2 diabetes mellitus with hyperglycemia: Secondary | ICD-10-CM

## 2019-10-23 DIAGNOSIS — R7989 Other specified abnormal findings of blood chemistry: Secondary | ICD-10-CM

## 2019-10-23 DIAGNOSIS — F411 Generalized anxiety disorder: Secondary | ICD-10-CM

## 2019-10-23 DIAGNOSIS — E785 Hyperlipidemia, unspecified: Secondary | ICD-10-CM | POA: Diagnosis not present

## 2019-10-23 LAB — COMPREHENSIVE METABOLIC PANEL
ALT: 15 U/L (ref 0–53)
AST: 12 U/L (ref 0–37)
Albumin: 4.1 g/dL (ref 3.5–5.2)
Alkaline Phosphatase: 76 U/L (ref 39–117)
BUN: 15 mg/dL (ref 6–23)
CO2: 25 mEq/L (ref 19–32)
Calcium: 9.6 mg/dL (ref 8.4–10.5)
Chloride: 106 mEq/L (ref 96–112)
Creatinine, Ser: 0.82 mg/dL (ref 0.40–1.50)
GFR: 94.48 mL/min (ref >60.00)
Glucose, Bld: 165 mg/dL — ABNORMAL HIGH (ref 70–99)
Potassium: 3.7 mEq/L (ref 3.5–5.1)
Sodium: 139 mEq/L (ref 135–145)
Total Bilirubin: 0.5 mg/dL (ref 0.2–1.2)
Total Protein: 7.2 g/dL (ref 6.0–8.3)

## 2019-10-23 LAB — POCT GLYCOSYLATED HEMOGLOBIN (HGB A1C): Hemoglobin A1C: 8.3 % — AB (ref 4.0–5.6)

## 2019-10-23 LAB — TSH: TSH: 0.37 u[IU]/mL (ref 0.35–4.50)

## 2019-10-23 LAB — CBC
HCT: 44.4 % (ref 39.0–52.0)
Hemoglobin: 15.3 g/dL (ref 13.0–17.0)
MCHC: 34.5 g/dL (ref 30.0–36.0)
MCV: 86.5 fl (ref 78.0–100.0)
Platelets: 267 10*3/uL (ref 150.0–400.0)
RBC: 5.13 Mil/uL (ref 4.22–5.81)
RDW: 13.3 % (ref 11.5–15.5)
WBC: 9.6 10*3/uL (ref 4.0–10.5)

## 2019-10-23 LAB — T3, FREE: T3, Free: 3.6 pg/mL (ref 2.3–4.2)

## 2019-10-23 LAB — T4, FREE: Free T4: 0.92 ng/dL (ref 0.60–1.60)

## 2019-11-01 ENCOUNTER — Other Ambulatory Visit: Payer: Self-pay | Admitting: Family Medicine

## 2019-11-01 ENCOUNTER — Encounter: Payer: Self-pay | Admitting: Family Medicine

## 2019-11-01 NOTE — Telephone Encounter (Signed)
Last refill: 1.27.21 #30, 1 Last OV: 3.16.21 dx. DM f/u

## 2019-11-10 ENCOUNTER — Other Ambulatory Visit: Payer: Self-pay | Admitting: Family Medicine

## 2019-12-24 ENCOUNTER — Other Ambulatory Visit: Payer: BC Managed Care – PPO

## 2019-12-27 ENCOUNTER — Other Ambulatory Visit: Payer: Self-pay | Admitting: Family Medicine

## 2019-12-27 ENCOUNTER — Telehealth: Payer: Self-pay | Admitting: Pulmonary Disease

## 2019-12-27 ENCOUNTER — Encounter: Payer: Self-pay | Admitting: Family Medicine

## 2019-12-27 DIAGNOSIS — R911 Solitary pulmonary nodule: Secondary | ICD-10-CM

## 2019-12-27 NOTE — Telephone Encounter (Signed)
Order placed for Bmet

## 2020-01-09 ENCOUNTER — Other Ambulatory Visit: Payer: Self-pay | Admitting: Family Medicine

## 2020-01-14 ENCOUNTER — Encounter: Payer: Self-pay | Admitting: Family Medicine

## 2020-01-14 ENCOUNTER — Other Ambulatory Visit: Payer: Self-pay

## 2020-01-14 MED ORDER — OMEGA-3-ACID ETHYL ESTERS 1 G PO CAPS: 1.0000 | ORAL_CAPSULE | Freq: Three times a day (TID) | ORAL | 3 refills | Status: DC

## 2020-01-18 DIAGNOSIS — L814 Other melanin hyperpigmentation: Secondary | ICD-10-CM | POA: Diagnosis not present

## 2020-01-18 DIAGNOSIS — L570 Actinic keratosis: Secondary | ICD-10-CM | POA: Diagnosis not present

## 2020-01-18 DIAGNOSIS — D2262 Melanocytic nevi of left upper limb, including shoulder: Secondary | ICD-10-CM | POA: Diagnosis not present

## 2020-01-18 DIAGNOSIS — L821 Other seborrheic keratosis: Secondary | ICD-10-CM | POA: Diagnosis not present

## 2020-01-18 DIAGNOSIS — D2261 Melanocytic nevi of right upper limb, including shoulder: Secondary | ICD-10-CM | POA: Diagnosis not present

## 2020-01-21 DIAGNOSIS — G4733 Obstructive sleep apnea (adult) (pediatric): Secondary | ICD-10-CM | POA: Diagnosis not present

## 2020-02-03 IMAGING — CT CT CHEST WITH CONTRAST
2 of 3 series · 15 of 36 positions shown, 18 images · IV contrast (omnipaque)
Comparison: CT 08/25/2017, 06/16/2015, PET-CT 06/30/2015

CLINICAL DATA: Follow-up lung nodule.

EXAM:
CT CHEST WITH CONTRAST
TECHNIQUE: Multidetector CT imaging of the chest was performed during
intravenous contrast administration.
CONTRAST:  80 mL Omnipaque

[Series 2: thorax · axial · 0.74mm/px · z∈[-328,-52]mm · 12 of 162 slices shown, 15 images]
[im 12/162  mediastinal]
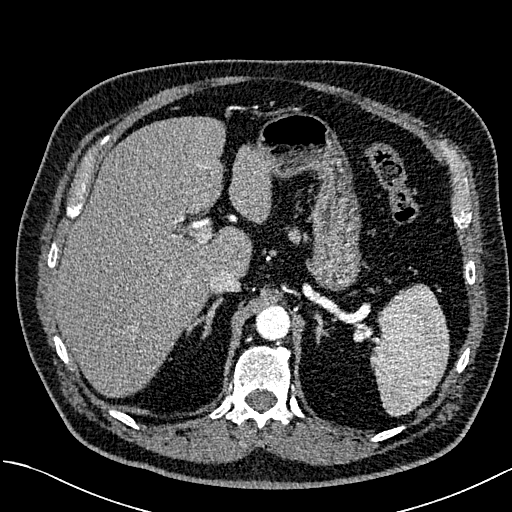
[im 12/162  lung]
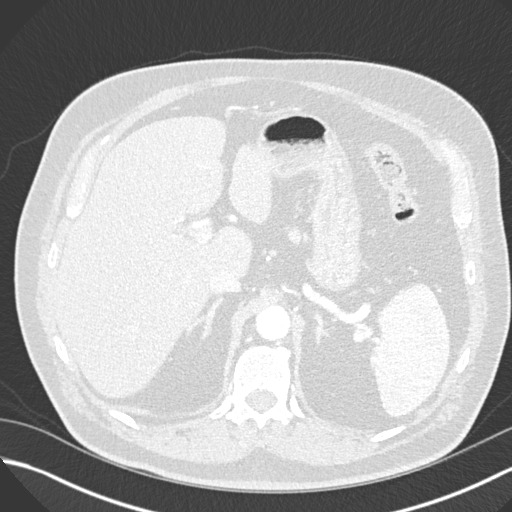
[im 24/162  lung]
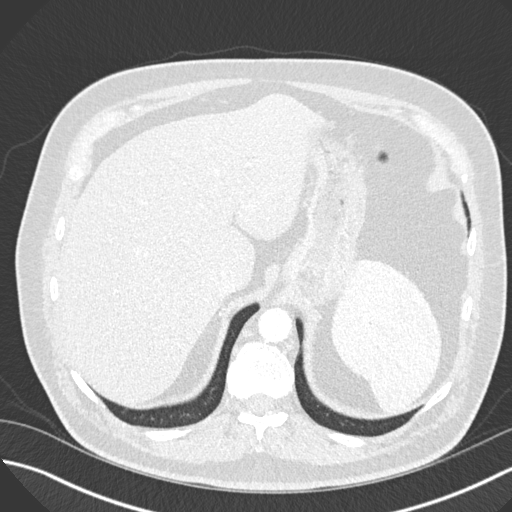
[im 36/162  lung]
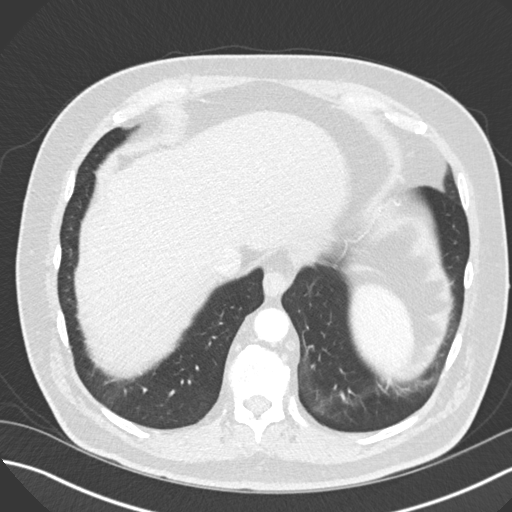
[im 48/162  lung]
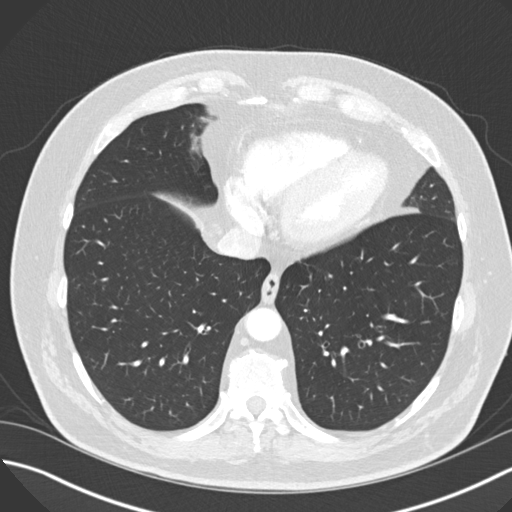
[im 60/162  mediastinal]
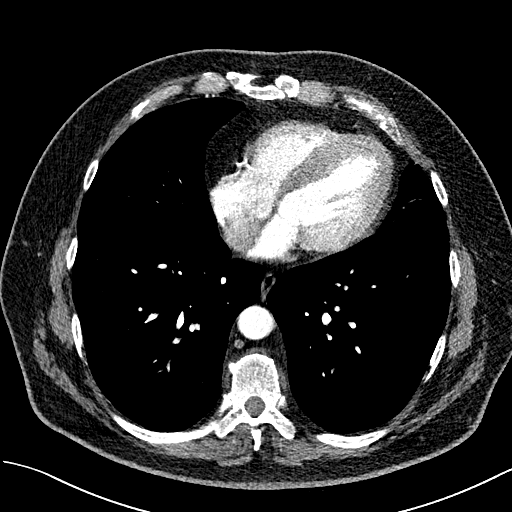
[im 60/162  lung]
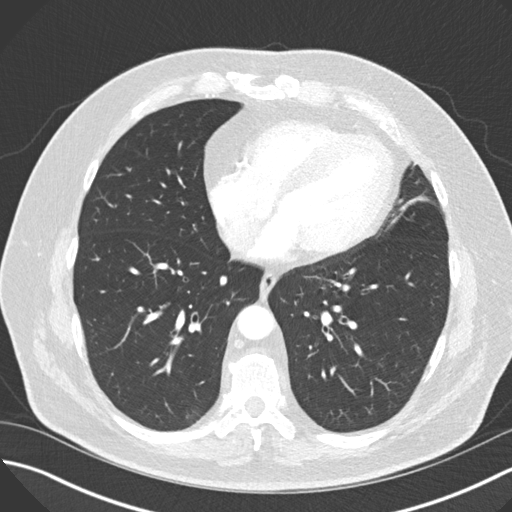
[im 72/162  lung]
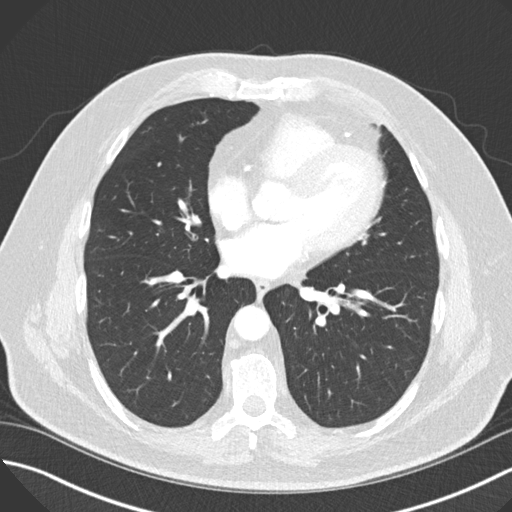
[im 90/162  lung]
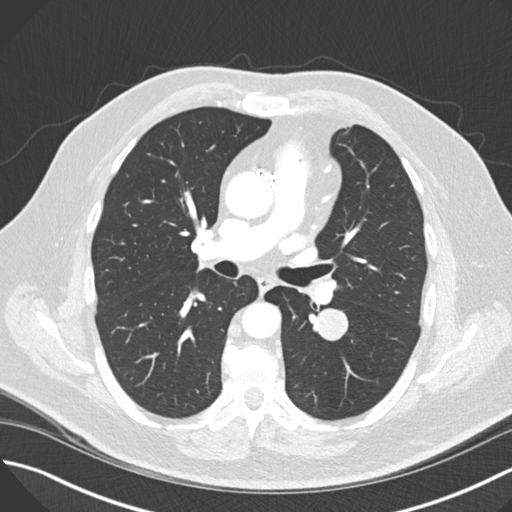
[im 102/162  lung]
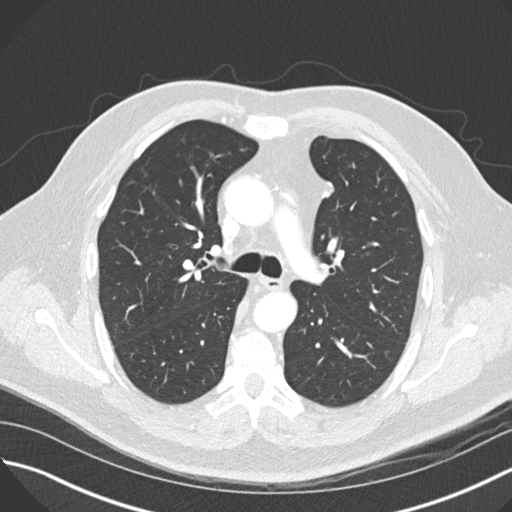
[im 114/162  mediastinal]
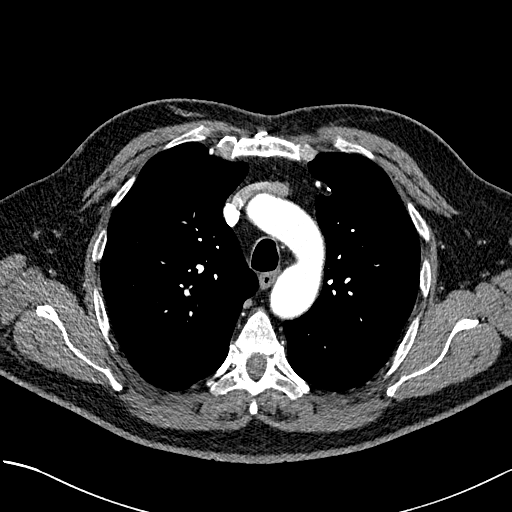
[im 114/162  lung]
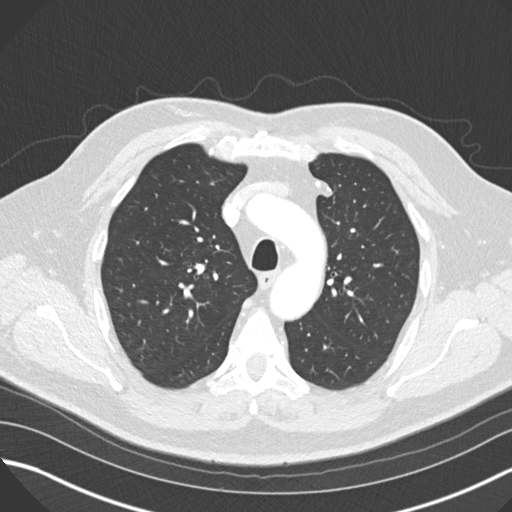
[im 126/162  lung]
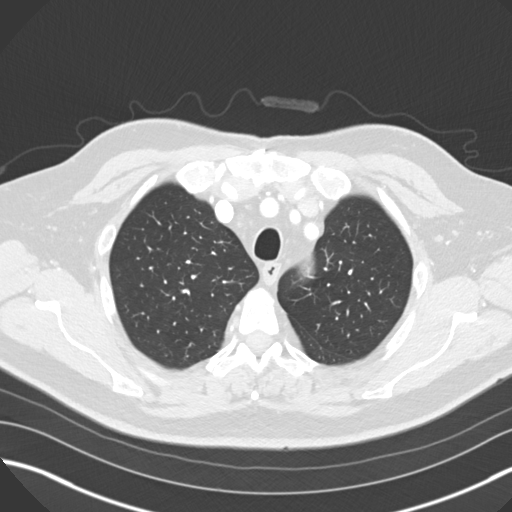
[im 138/162  lung]
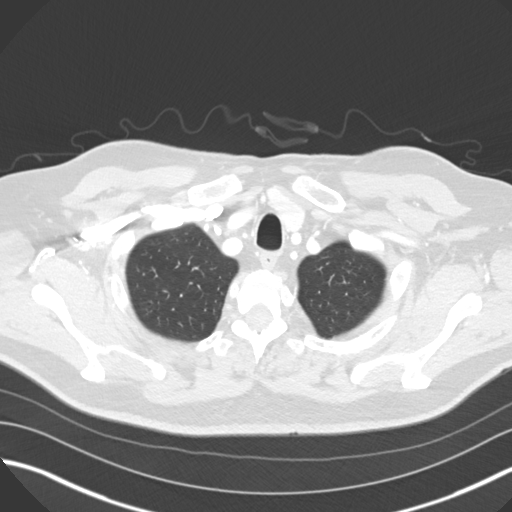
[im 150/162  lung]
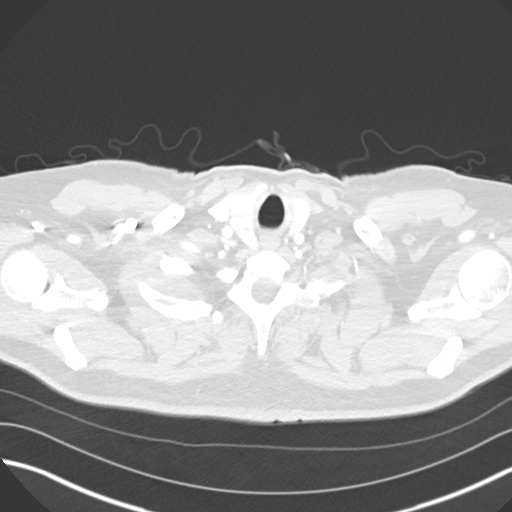

[Series 5: coronal · coronal · 0.63mm/px · 3 of 143 slices shown]
[im 29/143  lung]
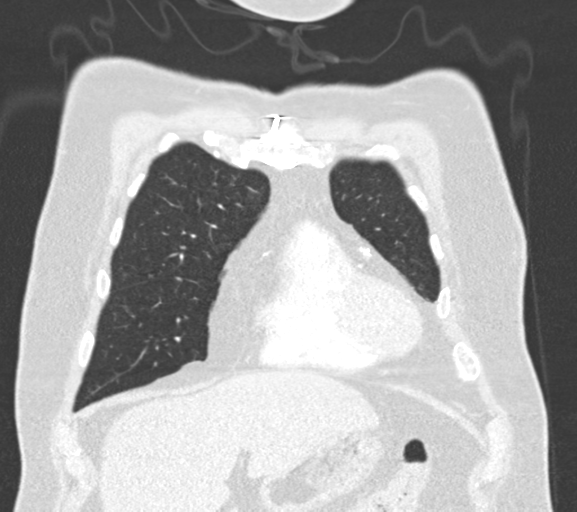
[im 57/143  lung]
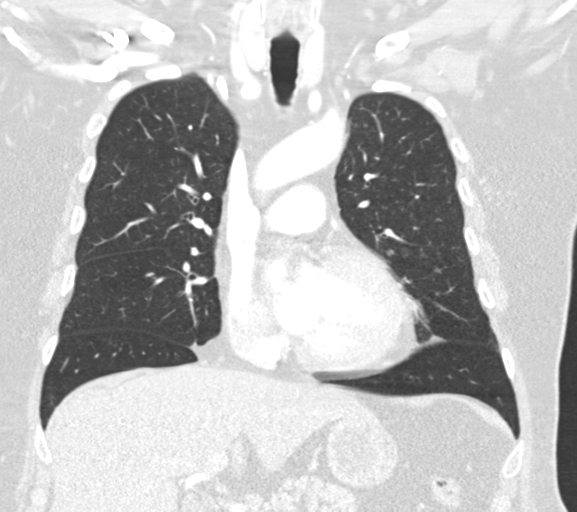
[im 86/143  lung]
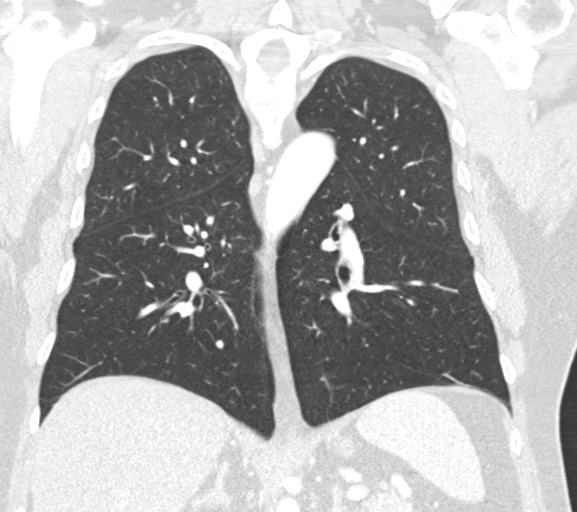

[15 of 36 positions shown; findings below may reference images not displayed]

FINDINGS: Cardiovascular: Coronary artery calcification and aortic
atherosclerotic calcification. Post CABG anatomy

Mediastinum/Nodes: No axillary or supraclavicular adenopathy.

Lungs/Pleura: Within the RIGHT lower lobe smoothly marginated round
nodule measures 2.3 x 2.3 cm. This is increased from 2.2 x 2.1 cm on
08/25/2017 and 1.7 x 1.8 cm on CT 06/16/2015. Lesion was not
hypermetabolic on comparison PET-CT scan 7104. No additional
suspicious nodules.

Upper Abdomen: Limited view of the liver, kidneys, pancreas are
unremarkable. Normal adrenal glands.

Musculoskeletal: No acute osseous abnormality.
IMPRESSION: 1. Slowly enlarging round smooth nodule in the LEFT lower lobe which
has low hypermetabolic activity on comparison PET-CT scan. Nodule
slowly increased in size from 7104 by several mm. Favor indolent
neoplasm such as hamartoma or bronchial carcinoid.
2. No mediastinal lymphadenopathy.
3. Post CABG.

## 2020-02-08 ENCOUNTER — Other Ambulatory Visit (INDEPENDENT_AMBULATORY_CARE_PROVIDER_SITE_OTHER): Payer: BC Managed Care – PPO

## 2020-02-08 ENCOUNTER — Other Ambulatory Visit: Payer: Self-pay | Admitting: Family Medicine

## 2020-02-08 DIAGNOSIS — R911 Solitary pulmonary nodule: Secondary | ICD-10-CM

## 2020-02-08 LAB — BASIC METABOLIC PANEL
BUN: 10 mg/dL (ref 6–23)
CO2: 26 mEq/L (ref 19–32)
Calcium: 9.9 mg/dL (ref 8.4–10.5)
Chloride: 101 mEq/L (ref 96–112)
Creatinine, Ser: 1 mg/dL (ref 0.40–1.50)
GFR: 75.07 mL/min (ref >60.00)
Glucose, Bld: 247 mg/dL — ABNORMAL HIGH (ref 70–99)
Potassium: 3.6 mEq/L (ref 3.5–5.1)
Sodium: 138 mEq/L (ref 135–145)

## 2020-02-13 ENCOUNTER — Other Ambulatory Visit: Payer: Self-pay | Admitting: Family Medicine

## 2020-02-13 ENCOUNTER — Other Ambulatory Visit: Payer: Self-pay

## 2020-02-13 ENCOUNTER — Ambulatory Visit (INDEPENDENT_AMBULATORY_CARE_PROVIDER_SITE_OTHER)
Admission: RE | Admit: 2020-02-13 | Discharge: 2020-02-13 | Disposition: A | Discharge status: Home / Self Care | Payer: BC Managed Care – PPO | Source: Ambulatory Visit | Attending: Pulmonary Disease | Admitting: Pulmonary Disease

## 2020-02-13 DIAGNOSIS — R918 Other nonspecific abnormal finding of lung field: Secondary | ICD-10-CM | POA: Diagnosis not present

## 2020-02-13 DIAGNOSIS — I2510 Atherosclerotic heart disease of native coronary artery without angina pectoris: Secondary | ICD-10-CM | POA: Diagnosis not present

## 2020-02-13 DIAGNOSIS — R911 Solitary pulmonary nodule: Secondary | ICD-10-CM | POA: Diagnosis not present

## 2020-02-13 DIAGNOSIS — I700 Atherosclerosis of aorta: Secondary | ICD-10-CM | POA: Diagnosis not present

## 2020-02-13 MED ORDER — IOHEXOL 300 MG/ML  SOLN
80.0000 mL | Freq: Once | INTRAMUSCULAR | Status: AC | PRN
  Administered 2020-02-13: 80 mL via INTRAVENOUS

## 2020-02-21 ENCOUNTER — Ambulatory Visit (INDEPENDENT_AMBULATORY_CARE_PROVIDER_SITE_OTHER): Payer: BC Managed Care – PPO | Admitting: Family Medicine

## 2020-02-21 ENCOUNTER — Emergency Department (HOSPITAL_BASED_OUTPATIENT_CLINIC_OR_DEPARTMENT_OTHER)
Admission: EM | Admit: 2020-02-21 | Discharge: 2020-02-22 | Disposition: A | Discharge status: Home / Self Care | Payer: BC Managed Care – PPO | Attending: Emergency Medicine | Admitting: Emergency Medicine

## 2020-02-21 ENCOUNTER — Encounter (HOSPITAL_BASED_OUTPATIENT_CLINIC_OR_DEPARTMENT_OTHER): Payer: Self-pay | Admitting: Emergency Medicine

## 2020-02-21 ENCOUNTER — Other Ambulatory Visit: Payer: Self-pay | Admitting: Family Medicine

## 2020-02-21 ENCOUNTER — Encounter: Payer: Self-pay | Admitting: Family Medicine

## 2020-02-21 ENCOUNTER — Other Ambulatory Visit: Payer: Self-pay

## 2020-02-21 ENCOUNTER — Emergency Department (HOSPITAL_BASED_OUTPATIENT_CLINIC_OR_DEPARTMENT_OTHER): Payer: BC Managed Care – PPO

## 2020-02-21 VITALS — BP 136/88 | HR 61 | Temp 98.4°F | Ht 71.0 in | Wt 204.0 lb

## 2020-02-21 DIAGNOSIS — K5730 Diverticulosis of large intestine without perforation or abscess without bleeding: Secondary | ICD-10-CM | POA: Diagnosis not present

## 2020-02-21 DIAGNOSIS — R109 Unspecified abdominal pain: Secondary | ICD-10-CM | POA: Insufficient documentation

## 2020-02-21 DIAGNOSIS — Q859 Phakomatosis, unspecified: Secondary | ICD-10-CM

## 2020-02-21 DIAGNOSIS — N400 Enlarged prostate without lower urinary tract symptoms: Secondary | ICD-10-CM | POA: Diagnosis not present

## 2020-02-21 DIAGNOSIS — E119 Type 2 diabetes mellitus without complications: Secondary | ICD-10-CM | POA: Insufficient documentation

## 2020-02-21 DIAGNOSIS — Z87891 Personal history of nicotine dependence: Secondary | ICD-10-CM | POA: Insufficient documentation

## 2020-02-21 DIAGNOSIS — X58XXXA Exposure to other specified factors, initial encounter: Secondary | ICD-10-CM | POA: Insufficient documentation

## 2020-02-21 DIAGNOSIS — S37002A Unspecified injury of left kidney, initial encounter: Secondary | ICD-10-CM | POA: Diagnosis not present

## 2020-02-21 DIAGNOSIS — S37022A Major contusion of left kidney, initial encounter: Secondary | ICD-10-CM | POA: Insufficient documentation

## 2020-02-21 DIAGNOSIS — E1165 Type 2 diabetes mellitus with hyperglycemia: Secondary | ICD-10-CM

## 2020-02-21 DIAGNOSIS — E1169 Type 2 diabetes mellitus with other specified complication: Secondary | ICD-10-CM | POA: Diagnosis not present

## 2020-02-21 DIAGNOSIS — E785 Hyperlipidemia, unspecified: Secondary | ICD-10-CM

## 2020-02-21 DIAGNOSIS — I10 Essential (primary) hypertension: Secondary | ICD-10-CM

## 2020-02-21 DIAGNOSIS — Z125 Encounter for screening for malignant neoplasm of prostate: Secondary | ICD-10-CM | POA: Diagnosis not present

## 2020-02-21 DIAGNOSIS — Y9289 Other specified places as the place of occurrence of the external cause: Secondary | ICD-10-CM | POA: Diagnosis not present

## 2020-02-21 DIAGNOSIS — J45909 Unspecified asthma, uncomplicated: Secondary | ICD-10-CM | POA: Insufficient documentation

## 2020-02-21 DIAGNOSIS — Z7982 Long term (current) use of aspirin: Secondary | ICD-10-CM | POA: Diagnosis not present

## 2020-02-21 DIAGNOSIS — J4540 Moderate persistent asthma, uncomplicated: Secondary | ICD-10-CM

## 2020-02-21 DIAGNOSIS — I2510 Atherosclerotic heart disease of native coronary artery without angina pectoris: Secondary | ICD-10-CM | POA: Insufficient documentation

## 2020-02-21 DIAGNOSIS — Y999 Unspecified external cause status: Secondary | ICD-10-CM | POA: Insufficient documentation

## 2020-02-21 DIAGNOSIS — S37012A Minor contusion of left kidney, initial encounter: Secondary | ICD-10-CM | POA: Diagnosis not present

## 2020-02-21 DIAGNOSIS — Z79899 Other long term (current) drug therapy: Secondary | ICD-10-CM | POA: Diagnosis not present

## 2020-02-21 DIAGNOSIS — Z0000 Encounter for general adult medical examination without abnormal findings: Secondary | ICD-10-CM | POA: Diagnosis not present

## 2020-02-21 DIAGNOSIS — Z23 Encounter for immunization: Secondary | ICD-10-CM

## 2020-02-21 DIAGNOSIS — Y939 Activity, unspecified: Secondary | ICD-10-CM | POA: Diagnosis not present

## 2020-02-21 DIAGNOSIS — S301XXA Contusion of abdominal wall, initial encounter: Secondary | ICD-10-CM | POA: Diagnosis not present

## 2020-02-21 DIAGNOSIS — G4700 Insomnia, unspecified: Secondary | ICD-10-CM

## 2020-02-21 DIAGNOSIS — G4733 Obstructive sleep apnea (adult) (pediatric): Secondary | ICD-10-CM

## 2020-02-21 DIAGNOSIS — Z7951 Long term (current) use of inhaled steroids: Secondary | ICD-10-CM | POA: Insufficient documentation

## 2020-02-21 DIAGNOSIS — N2889 Other specified disorders of kidney and ureter: Secondary | ICD-10-CM | POA: Diagnosis not present

## 2020-02-21 DIAGNOSIS — Z7984 Long term (current) use of oral hypoglycemic drugs: Secondary | ICD-10-CM | POA: Insufficient documentation

## 2020-02-21 LAB — COMPREHENSIVE METABOLIC PANEL
ALT: 13 U/L (ref 0–44)
AST: 13 U/L — ABNORMAL LOW (ref 15–41)
Albumin: 4 g/dL (ref 3.5–5.0)
Alkaline Phosphatase: 76 U/L (ref 38–126)
Anion gap: 11 (ref 5–15)
BUN: 16 mg/dL (ref 8–23)
CO2: 24 mmol/L (ref 22–32)
Calcium: 9 mg/dL (ref 8.9–10.3)
Chloride: 101 mmol/L (ref 98–111)
Creatinine, Ser: 1.03 mg/dL (ref 0.61–1.24)
GFR calc Af Amer: >60 mL/min (ref >60)
GFR calc non Af Amer: >60 mL/min (ref >60)
Glucose, Bld: 242 mg/dL — ABNORMAL HIGH (ref 70–99)
Potassium: 3.7 mmol/L (ref 3.5–5.1)
Sodium: 136 mmol/L (ref 135–145)
Total Bilirubin: 0.6 mg/dL (ref 0.3–1.2)
Total Protein: 8.1 g/dL (ref 6.5–8.1)

## 2020-02-21 LAB — CBC
HCT: 42 % (ref 39.0–52.0)
Hemoglobin: 14.2 g/dL (ref 13.0–17.0)
MCH: 28.6 pg (ref 26.0–34.0)
MCHC: 33.8 g/dL (ref 30.0–36.0)
MCV: 84.7 fL (ref 80.0–100.0)
Platelets: 329 10*3/uL (ref 150–400)
RBC: 4.96 MIL/uL (ref 4.22–5.81)
RDW: 12.9 % (ref 11.5–15.5)
WBC: 14.3 10*3/uL — ABNORMAL HIGH (ref 4.0–10.5)
nRBC: 0 % (ref 0.0–0.2)

## 2020-02-21 LAB — URINALYSIS, ROUTINE W REFLEX MICROSCOPIC
Bilirubin Urine: NEGATIVE
Glucose, UA: >=500 mg/dL — AB
Ketones, ur: NEGATIVE mg/dL
Leukocytes,Ua: NEGATIVE
Nitrite: NEGATIVE
Protein, ur: NEGATIVE mg/dL
Specific Gravity, Urine: 1.01 (ref 1.005–1.030)
pH: 6 (ref 5.0–8.0)

## 2020-02-21 LAB — URINALYSIS, MICROSCOPIC (REFLEX): WBC, UA: NONE SEEN WBC/hpf (ref 0–5)

## 2020-02-21 LAB — LIPASE, BLOOD: Lipase: 31 U/L (ref 11–51)

## 2020-02-21 MED ORDER — ONDANSETRON HCL 4 MG/2ML IJ SOLN
4.0000 mg | Freq: Once | INTRAMUSCULAR | Status: AC | PRN
  Administered 2020-02-21: 4 mg via INTRAVENOUS
  Filled 2020-02-21: qty 2

## 2020-02-21 MED ORDER — FENTANYL CITRATE (PF) 100 MCG/2ML IJ SOLN
25.0000 ug | Freq: Once | INTRAMUSCULAR | Status: AC
  Administered 2020-02-21: 25 ug via INTRAVENOUS
  Filled 2020-02-21: qty 2

## 2020-02-21 MED ORDER — KETOROLAC TROMETHAMINE 60 MG/2ML IM SOLN
15.0000 mg | Freq: Once | INTRAMUSCULAR | Status: AC
  Administered 2020-02-21: 15 mg via INTRAMUSCULAR
  Filled 2020-02-21: qty 2

## 2020-02-21 MED ORDER — ALPRAZOLAM 0.5 MG PO TABS: 0.5000 mg | ORAL_TABLET | Freq: Four times a day (QID) | ORAL | 3 refills | Status: DC | PRN

## 2020-02-21 MED ORDER — ONDANSETRON HCL 4 MG/2ML IJ SOLN
4.0000 mg | Freq: Once | INTRAMUSCULAR | Status: AC
  Administered 2020-02-21: 4 mg via INTRAVENOUS
  Filled 2020-02-21: qty 2

## 2020-02-21 MED ORDER — SODIUM CHLORIDE 0.9 % IV BOLUS
1000.0000 mL | Freq: Once | INTRAVENOUS | Status: AC
  Administered 2020-02-21: 1000 mL via INTRAVENOUS

## 2020-02-21 NOTE — ED Notes (Signed)
Patient transported to CT 

## 2020-02-21 NOTE — Discharge Instructions (Signed)
Take 4 over the counter ibuprofen tablets 3 times a day or 2 over-the-counter naproxen tablets twice a day for pain. Also take tylenol 1000mg (2 extra strength) four times a day.   Follow up with the urologist in the office.  Return for worsening symptoms.

## 2020-02-21 NOTE — Progress Notes (Signed)
Phone: [phone]   Subjective:  Patient presents today for their annual physical. Chief complaint-noted.   See problem oriented charting- Review of Systems  Constitutional: Negative for chills and fever.  HENT: Negative for ear pain, hearing loss and tinnitus.   Eyes: Negative for blurred vision, double vision and photophobia.       Cataracts removed in oct  And dec vision back to 20/20  Respiratory: Negative for cough, shortness of breath and wheezing.   Cardiovascular: Negative for chest pain, palpitations and leg swelling.  Gastrointestinal: Negative for blood in stool, constipation, diarrhea, heartburn, nausea and vomiting.  Genitourinary: Negative for dysuria, frequency and urgency.  Musculoskeletal: Negative for back pain, joint pain and neck pain.  Skin: Positive for rash.       B/l arms has rash has been seen by Dermatology and has been started on cream for that.    Neurological: Negative for dizziness, seizures, weakness and headaches.  Endo/Heme/Allergies: Does not bruise/bleed easily.  Psychiatric/Behavioral: Positive for depression. Negative for memory loss and suicidal ideas. The patient does not have insomnia.        Related to covid and work    The following were reviewed and entered/updated in epic: Past Medical History:  Diagnosis Date  . Allergic rhinitis   . Allergy   . Anxiety   . Asthma   . Diabetes ([place])   . Diverticulitis   . GERD (gastroesophageal reflux disease)    very occ  . History of heart attack   . HLD (hyperlipidemia)   . HTN (hypertension)   . Myocardial infarction ([place]) 2000  . Sleep apnea    wears cpap   . Tubular adenoma of colon 09/2008   Patient Active Problem List   Diagnosis Date Noted  . Well controlled type 2 diabetes mellitus ([place]) 03/31/2010    Priority: High  . Coronary artery disease involving native coronary artery of native heart without angina pectoris 04/06/2007    Priority: High  . OSA (obstructive sleep apnea)  01/09/2018    Priority: Medium  . Hamartoma ([place]) 03/15/2017    Priority: Medium  . Insomnia 05/27/2014    Priority: Medium  . Asthma, moderate persistent 06/19/2008    Priority: Medium  . ERECTILE DYSFUNCTION, SECONDARY TO MEDICATION 05/17/2007    Priority: Medium  . Hyperlipidemia 04/06/2007    Priority: Medium  . Anxiety state 04/06/2007    Priority: Medium  . Essential hypertension 04/06/2007    Priority: Medium  . Former smoker 11/04/2014    Priority: Low  . Plantar wart of left foot 11/27/2010    Priority: Low  . PSA, INCREASED 09/15/2009    Priority: Low  . Diverticulitis of colon 05/08/2009    Priority: Low  . ANKLE PAIN, CHRONIC 06/05/2007    Priority: Low  . Allergic rhinitis 05/17/2007    Priority: Low   Past Surgical History:  Procedure Laterality Date  . CARDIAC CATHETERIZATION N/A 07/20/2016   Procedure: Left Heart Cath and Cors/Grafts Angiography;  Surgeon: Belva Crome, MD;  Location: [facility];  Service: Cardiovascular;  Laterality: N/A;  . CATARACT EXTRACTION Bilateral 06/06/2019   Dr. Tommy Rainwater. oct left, dec right 2020   . COLONOSCOPY    . CORONARY ANGIOPLASTY    . CORONARY ARTERY BYPASS GRAFT  06/1999  . POLYPECTOMY    . TONSILLECTOMY     late50's early 20's    Family History  Problem Relation Age of Onset  . Hypertension Mother   . Alzheimer's disease  Mother   . Colon polyps Mother   . Hyperlipidemia Father   . Hypertension Father   . Colon polyps Father   . Colon cancer Paternal Aunt   . Pancreatic cancer Neg Hx   . Stomach cancer Neg Hx   . Thyroid disease Neg Hx   . Esophageal cancer Neg Hx   . Rectal cancer Neg Hx     Medications- reviewed and updated Current Outpatient Medications  Medication Sig Dispense Refill  . albuterol (PROVENTIL HFA;VENTOLIN HFA) 108 (90 Base) MCG/ACT inhaler Inhale 2 puffs into the lungs every 6 (six) hours as needed for wheezing or shortness of breath. 1 Inhaler 5  . ALPRAZolam (XANAX) 0.5 MG  tablet TAKE 1 TABLET BY MOUTH EVERY 6 HOURS AS NEEDED 30 tablet 1  . amLODipine (NORVASC) 5 MG tablet TAKE 1 TABLET BY MOUTH EVERY DAY 90 tablet 1  . aspirin EC 81 MG tablet Take 1 tablet (81 mg total) by mouth daily. 90 tablet 3  . Azelastine-Fluticasone 137-50 MCG/ACT SUSP Place 1 spray into the nose as needed.    . bisoprolol-hydrochlorothiazide (ZIAC) 10-6.25 MG tablet TAKE 1 TABLET BY MOUTH EVERY DAY 90 tablet 1  . famciclovir (FAMVIR) 500 MG tablet TAKE 3 TABLETS BY MOUTH EVERY DAY AS NEEDED AT FIRST SIGN OF OUTBREAK OF FEVER BLISTER 15 tablet 1  . fluticasone-salmeterol (ADVAIR HFA) 45-21 MCG/ACT inhaler Inhale 2 puffs into the lungs 2 (two) times daily. 1 Inhaler 2  . folic acid (FOLVITE) 694 MCG tablet Take 1,600 mcg by mouth daily.      Marland Kitchen glimepiride (AMARYL) 2 MG tablet TAKE 1 TABLET (2 MG TOTAL) BY MOUTH DAILY BEFORE BREAKFAST. 90 tablet 3  . glucose blood (BAYER CONTOUR NEXT TEST) test strip Use to test blood sugars daily. Dx: E11.9 100 each 12  . INVOKAMET [id number] MG TABS TAKE 1 TABLET TWICE A DAY 180 tablet 3  . JANUVIA 100 MG tablet TAKE 1 TABLET BY MOUTH EVERY DAY 90 tablet 1  . losartan (COZAAR) 100 MG tablet TAKE 1 TABLET BY MOUTH EVERY DAY 90 tablet 3  . Multiple Vitamin (MULTIVITAMIN) tablet Take 1 tablet by mouth daily.      . nitroGLYCERIN (NITROSTAT) 0.4 MG SL tablet PLACE 1 TABLET UNDER THE TONGUE EVERY 5 MINUTES AS NEEDED FOR CHEST PAIN. 25 tablet 1  . omega-3 acid ethyl esters (LOVAZA) 1 g capsule Take 1 capsule (1 g total) by mouth 3 (three) times daily. 270 capsule 3  . oxymetazoline (AFRIN) 0.05 % nasal spray Place 1 spray into both nostrils 2 (two) times daily as needed for congestion.    . rosuvastatin (CRESTOR) 20 MG tablet TAKE 1 TABLET BY MOUTH DAILY 90 tablet 4  . zaleplon (SONATA) 10 MG capsule TAKE 1 CAPSULE (10 MG TOTAL) BY MOUTH AT BEDTIME AS NEEDED. FOR SLEEP 30 capsule 5   No current facility-administered medications for this visit.     Allergies-reviewed and updated Allergies  Allergen Reactions  . Morphine Sulfate Nausea And Vomiting and Swelling    Social History   Social History Narrative   Married  In 1981 with 2 kids (son and daughter). No grandkids.       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 Oropharynx normal Neck: no thyromegaly CV: RRR no murmurs rubs or gallops Lungs: CTAB no crackles, wheeze, rhonchi Abdomen: soft/nontender/nondistended/normal bowel sounds. No rebound or guarding.  Ext: no edema Skin: warm, dry Neuro: grossly normal, moves all extremities, PERRLA  Diabetic Foot Exam - Simple   Simple Foot Form Diabetic Foot exam was performed with the following findings: Yes 02/21/2020  4:33 PM  Visual Inspection No deformities, no ulcerations, no other skin breakdown bilaterally: Yes Sensation Testing Intact to touch and monofilament testing bilaterally: Yes Pulse Check Posterior Tibialis and Dorsalis pulse intact bilaterally: Yes Comments       Assessment and Plan  64 y.o. male presenting for annual physical.  Health Maintenance counseling: 1. Anticipatory guidance: Patient counseled regarding regular dental exams q6 months, eye exams yearly,  avoiding smoking and second hand smoke , limiting alcohol to 2 beverages per day . Occasionally on average 1-2 drinks a month.    2. Risk factor reduction:  Advised patient of need for regular exercise and diet rich and fruits and vegetables to reduce risk of heart attack and stroke. Exercise- walks 3 days a week for at least 1 mile- discussed increasing. Diet-has started limiting red meat adding fresh vegetables.  Has made a few poor choices but weight down Wt  Readings from Last 3 Encounters:  02/21/20 204 lb (92.5 kg)  10/23/19 207 lb (93.9 kg)  07/30/19 211 lb 3.2 oz (95.8 kg)  3. Immunizations/screenings/ancillary studies- 1st shingrix today- will return for 2nd injection Immunization History  Administered Date(s) Administered  . Influenza Split 05/12/2011, 04/25/2012  . Influenza Whole 05/17/2007, 05/10/2008, 06/06/2009, 06/10/2010  . Influenza,inj,Quad PF,6+ Mos 05/16/2013, 05/27/2014, 05/29/2015, 05/11/2016, 05/09/2017, 05/09/2018, 05/04/2019  . PFIZER SARS-COV-2 Vaccination 09/15/2019, 10/09/2019  . Pneumococcal Conjugate-13 08/14/2014  . Pneumococcal Polysaccharide-23 11/05/2015  . Td 08/09/2002  . Tdap 10/20/2012  . Zoster 07/18/2015  4. Prostate cancer screening- update psa with labs today -has been stable in past  Lab Results  Component Value Date   PSA 1.07 01/16/2019   PSA 1.20 11/14/2017   PSA 0.86 11/04/2016   5. Colon cancer screening - may 2020 with 7 year repeat planned.  6. Skin cancer screening- followed by Dermatology just seen recently.  advised regular sunscreen use. Denies worrisome, changing, or new skin lesions.  - on rash cream through dermatology. Also did rosacea treatment with doxycycline and that helped.  7.  Former smoker-2 pack years quit many years ago.  Discussed AAA scan next physical  8. STD screening - declines   Status of chronic or acute concerns   Essential hypertension - Plan: CBC with Differential/Platelet, Comprehensive metabolic panel -Controlled on repeat on Ziac 10-6.25 mg, amlodipine 5 mg, losartan 100 mg.  Continue current medication  Poorly controlled type 2 diabetes mellitus ([place]) - Plan: Hemoglobin A1c -Invokamet [id number] mg twice daily, Januvia 100 mg, glimepiride 2 mg.  We had considered changing Januvia to [place] but he wanted to work on lifestyle-down an additional 3 pounds from last visit -Update A1c with labs today  -200 on home scales -not eating as well as he should- worried  a1c may be higher- still prefers  -walking 3 nights a week 1.2 miles. Discussed  increasing Lab Results  Component Value Date   HGBA1C 8.3 (A) 10/23/2019   HGBA1C 9.0 (A) 07/09/2019   HGBA1C 9.4 (H) 01/16/2019       Coronary artery disease involving  native coronary artery of native heart without angina pectoris/hyperlipidemia -asymptomatic -Compliant with Crestor 20 mg-update lipid panel -Compliant with aspirin.  Follows with cardiology- sees Dr. Johnsie Cancel september   Anxiety/insomnia/stress -work stress slightly better nad with retirement date things are better- much less work . covid stressors some better.  -not checking cell on the weekend and that helps - back in the office daily and that helps. Immediate boss is great.  -Compliant with Xanax 0.5 mg daily- helps with work days-continuing at least through into retirement which is nov 2022.  -with sonata getting 7-8 hours of sleep  Moderate persistent asthma without complication -has been using albuterol one a week. Has not had to use Advair discussed can use if gets ill. May need in fall as well  OSA (obstructive sleep apnea)-compliant with CPAP but doesn't like it - uses on and off  Hamartoma ([facility])-slight increasing size of lung mass-has upcoming follow-up with pulmonology to discuss further   Recommended follow up: 4 months or 14 weeks for diabetes recheck Future Appointments  Date Time Provider [place]  03/05/2020  3:30 PM Martyn Ehrich, NP [facility] None  05/06/2020  8:30 AM Josue Hector, MD [id number]   Lab/Order associations: ate 9 am- labs 4 30   ICD-10-CM   1. Essential hypertension  I10 CBC with Differential/Platelet    Comprehensive metabolic panel  2. Preventative health care  Z00.00 CBC with Differential/Platelet    Comprehensive metabolic panel    Lipid panel  3. Hyperlipidemia associated with type 2 diabetes mellitus ([facility])  E11.69 CBC with Differential/Platelet   E78.5  Comprehensive metabolic panel    Lipid panel  4. Poorly controlled type 2 diabetes mellitus ([facility])  E11.65 Hemoglobin A1c  5. Coronary artery disease involving native coronary artery of native heart without angina pectoris  I25.10   6. Insomnia, unspecified type  G47.00   7. Hyperlipidemia, unspecified hyperlipidemia type  E78.5   8. Moderate persistent asthma without complication  H74.16   9. Screening for prostate cancer  Z12.5 PSA  10. Former smoker  Z87.891   51. OSA (obstructive sleep apnea)  G47.33   12. Hamartoma ([place])  Q85.9     Meds ordered this encounter  Medications  . ALPRAZolam (XANAX) 0.5 MG tablet    Sig: Take 1 tablet (0.5 mg total) by mouth every 6 (six) hours as needed.    Dispense:  30 tablet    Refill:  3    Not to exceed 2 additional fills before 04/29/2020    Return precautions advised.  [name], MD

## 2020-02-21 NOTE — ED Provider Notes (Signed)
[facility] EMERGENCY DEPARTMENT Provider Note   CSN: [id number] Arrival date & time: 02/21/20  2032     History Chief Complaint  Patient presents with  . Back Pain    Larry Garner is a 64 y.o. male.  64 yo M with a chief complaints of left flank pain.  This started suddenly this evening.  Pain radiates down into his abdomen.  Nothing seems to make it better or worse.  Is unable to find a comfortable position.  Has had some nausea but no vomiting.  No fevers.  No urinary symptoms.  Denies radiation to the leg.  The history is provided by the patient.  Back Pain Location:  Thoracic spine Quality:  Aching Radiates to: abdomen. Pain severity:  Mild Onset quality:  Gradual Duration:  2 hours Timing:  Constant Progression:  Unchanged Chronicity:  New Relieved by:  Nothing Worsened by:  Nothing Ineffective treatments:  None tried Associated symptoms: no abdominal pain, no chest pain, no fever and no headaches        Past Medical History:  Diagnosis Date  . Allergic rhinitis   . Allergy   . Anxiety   . Asthma   . Diabetes ([place])   . Diverticulitis   . GERD (gastroesophageal reflux disease)    very occ  . History of heart attack   . HLD (hyperlipidemia)   . HTN (hypertension)   . Myocardial infarction ([place]) 2000  . Sleep apnea    wears cpap   . Tubular adenoma of colon 09/2008    Patient Active Problem List   Diagnosis Date Noted  . OSA (obstructive sleep apnea) 01/09/2018  . [place] ([place]) 03/15/2017  . Former smoker 11/04/2014  . Insomnia 05/27/2014  . Plantar wart of left foot 11/27/2010  . Well controlled type 2 diabetes mellitus ([place]) 03/31/2010  . PSA, INCREASED 09/15/2009  . Diverticulitis of colon 05/08/2009  . Asthma, moderate persistent 06/19/2008  . ANKLE PAIN, CHRONIC 06/05/2007  . Allergic rhinitis 05/17/2007  . ERECTILE DYSFUNCTION, SECONDARY TO MEDICATION 05/17/2007  . Hyperlipidemia 04/06/2007  . Anxiety state 04/06/2007  .  Essential hypertension 04/06/2007  . Coronary artery disease involving native coronary artery of native heart without angina pectoris 04/06/2007    Past Surgical History:  Procedure Laterality Date  . CARDIAC CATHETERIZATION N/A 07/20/2016   Procedure: Left Heart Cath and Cors/Grafts Angiography;  Surgeon: Belva Crome, MD;  Location: [facility];  Service: Cardiovascular;  Laterality: N/A;  . CATARACT EXTRACTION Bilateral 06/06/2019   Dr. Tommy Rainwater. oct left, dec right 2020   . COLONOSCOPY    . CORONARY ANGIOPLASTY    . CORONARY ARTERY BYPASS GRAFT  06/1999  . POLYPECTOMY    . TONSILLECTOMY     late50's early 21's       Family History  Problem Relation Age of Onset  . Hypertension Mother   . Alzheimer's disease Mother   . Colon polyps Mother   . Hyperlipidemia Father   . Hypertension Father   . Colon polyps Father   . Colon cancer Paternal Aunt   . Pancreatic cancer Neg Hx   . Stomach cancer Neg Hx   . Thyroid disease Neg Hx   . Esophageal cancer Neg Hx   . Rectal cancer Neg Hx     Social History   Tobacco Use  . Smoking status: Former Smoker    Packs/day: 0.50    Years: 4.00    Pack years: 2.00    Types:  Cigarettes    Quit date: 09/29/1977    Years since quitting: 42.4  . Smokeless tobacco: Never Used  . Tobacco comment: quit on 1980  Vaping Use  . Vaping Use: Never used  Substance Use Topics  . Alcohol use: Yes    Alcohol/week: 1.0 standard drink    Types: 1 Standard drinks or equivalent per week  . Drug use: No    Home Medications Prior to Admission medications   Medication Sig Start Date End Date Taking? Authorizing Provider  albuterol (PROVENTIL HFA;VENTOLIN HFA) 108 (90 Base) MCG/ACT inhaler Inhale 2 puffs into the lungs every 6 (six) hours as needed for wheezing or shortness of breath. 09/20/16   Marin Olp, MD  ALPRAZolam Duanne Moron) 0.5 MG tablet Take 1 tablet (0.5 mg total) by mouth every 6 (six) hours as needed. 02/21/20   Marin Olp, MD  amLODipine (NORVASC) 5 MG tablet TAKE 1 TABLET BY MOUTH EVERY DAY 02/21/20   Marin Olp, MD  aspirin EC 81 MG tablet Take 1 tablet (81 mg total) by mouth daily. 07/11/17   Josue Hector, MD  Azelastine-Fluticasone 137-50 MCG/ACT SUSP Place 1 spray into the nose as needed.    [provider]  bisoprolol-hydrochlorothiazide [facility]) 10-6.25 MG tablet TAKE 1 TABLET BY MOUTH EVERY DAY 11/12/19   Marin Olp, MD  famciclovir (FAMVIR) 500 MG tablet TAKE 3 TABLETS BY MOUTH EVERY DAY AS NEEDED AT FIRST SIGN OF OUTBREAK OF FEVER BLISTER 04/25/19   Marin Olp, MD  fluticasone-salmeterol (ADVAIR HFA) 530 081 5289 MCG/ACT inhaler Inhale 2 puffs into the lungs 2 (two) times daily. 01/31/19   Marin Olp, MD  folic acid (FOLVITE) 656 MCG tablet Take 1,600 mcg by mouth daily.      [provider]  glimepiride (AMARYL) 2 MG tablet TAKE 1 TABLET (2 MG TOTAL) BY MOUTH DAILY BEFORE BREAKFAST. 02/08/20   Marin Olp, MD  glucose blood (BAYER CONTOUR NEXT TEST) test strip Use to test blood sugars daily. Dx: E11.9 09/29/18   Marin Olp, MD  INVOKAMET 216 046 2638 MG TABS TAKE 1 TABLET TWICE A DAY 02/28/19   Marin Olp, MD  JANUVIA 100 MG tablet TAKE 1 TABLET BY MOUTH EVERY DAY 10/01/19   Marin Olp, MD  losartan (COZAAR) 100 MG tablet TAKE 1 TABLET BY MOUTH EVERY DAY 02/08/20   Marin Olp, MD  Multiple Vitamin (MULTIVITAMIN) tablet Take 1 tablet by mouth daily.      [provider]  nitroGLYCERIN (NITROSTAT) 0.4 MG SL tablet PLACE 1 TABLET UNDER THE TONGUE EVERY 5 MINUTES AS NEEDED FOR CHEST PAIN. 07/20/19   Marin Olp, MD  omega-3 acid ethyl esters (LOVAZA) 1 g capsule Take 1 capsule (1 g total) by mouth 3 (three) times daily. 01/14/20   Marin Olp, MD  oxymetazoline (AFRIN) 0.05 % nasal spray Place 1 spray into both nostrils 2 (two) times daily as needed for congestion.    [provider]  rosuvastatin (CRESTOR) 20 MG tablet TAKE  1 TABLET BY MOUTH DAILY 02/13/20   Marin Olp, MD  zaleplon (SONATA) 10 MG capsule TAKE 1 CAPSULE (10 MG TOTAL) BY MOUTH AT BEDTIME AS NEEDED. FOR SLEEP 08/13/19   Marin Olp, MD    Allergies    Morphine sulfate  Review of Systems   Review of Systems  Constitutional: Negative for chills and fever.  HENT: Negative for congestion and facial swelling.   Eyes: Negative for discharge  and visual disturbance.  Respiratory: Negative for shortness of breath.   Cardiovascular: Negative for chest pain and palpitations.  Gastrointestinal: Negative for abdominal pain, diarrhea and vomiting.  Musculoskeletal: Positive for back pain. Negative for arthralgias and myalgias.  Skin: Negative for color change and rash.  Neurological: Negative for tremors, syncope and headaches.  Psychiatric/Behavioral: Negative for confusion and dysphoric mood.    Physical Exam Updated Vital Signs BP (!) 169/90 (BP Location: Right Arm)   Pulse 73   Temp 99 F (37.2 C) (Oral)   Resp 16   Wt 91.9 kg   SpO2 98%   BMI 28.27 kg/m   Physical Exam Vitals and nursing note reviewed.  Constitutional:      Appearance: He is well-developed.  HENT:     Head: Normocephalic and atraumatic.  Eyes:     Pupils: Pupils are equal, round, and reactive to light.  Neck:     Vascular: No JVD.  Cardiovascular:     Rate and Rhythm: Normal rate and regular rhythm.     Heart sounds: No murmur heard.  No friction rub. No gallop.   Pulmonary:     Effort: No respiratory distress.     Breath sounds: No wheezing.  Abdominal:     General: There is no distension.     Tenderness: There is no guarding or rebound.  Musculoskeletal:        General: Normal range of motion.     Cervical back: Normal range of motion and neck supple.     Comments: No palpable back pain  Skin:    Coloration: Skin is not pale.     Findings: No rash.  Neurological:     Mental Status: He is alert and oriented to person, place, and time.    Psychiatric:        Behavior: Behavior normal.     ED Results / Procedures / Treatments   Labs (all labs ordered are listed, but only abnormal results are displayed) Labs Reviewed  URINALYSIS, ROUTINE W REFLEX MICROSCOPIC - Abnormal; Notable for the following components:      Result Value   Glucose, UA >=500 (*)    Hgb urine dipstick TRACE (*)    All other components within normal limits  URINALYSIS, MICROSCOPIC (REFLEX) - Abnormal; Notable for the following components:   Bacteria, UA RARE (*)    All other components within normal limits  COMPREHENSIVE METABOLIC PANEL - Abnormal; Notable for the following components:   Glucose, Bld 242 (*)    AST 13 (*)    All other components within normal limits  CBC - Abnormal; Notable for the following components:   WBC 14.3 (*)    All other components within normal limits  LIPASE, BLOOD    EKG None  Radiology CT Renal Stone Study  Result Date: 02/21/2020 CLINICAL DATA:  Sudden onset of abdominal and back pain.  Nausea. Flank pain, kidney stone suspected EXAM: CT ABDOMEN AND PELVIS WITHOUT CONTRAST TECHNIQUE: Multidetector CT imaging of the abdomen and pelvis was performed following the standard protocol without IV contrast. COMPARISON:  Contrast-enhanced exam 06/06/2015 FINDINGS: Lower chest: The lung bases are clear. Hepatobiliary: No evidence of focal hepatic lesion on noncontrast exam. Intraluminal gallstone without pericholecystic inflammation or biliary dilatation. Pancreas: No ductal dilatation or inflammation. Spleen: Normal in size without focal abnormality. Adrenals/Urinary Tract: Normal adrenal glands. There is a subcapsular hematoma involving the left kidney with hyperdense crescentic fluid collection. This measures up to 3.1 cm about the inferior kidney.  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 Hematoma  causes compression of the renal parenchyma. There is moderate left perinephric edema. No renal stone. There is no hydronephrosis. Minimal stranding about the right kidney without stone or hydronephrosis. Two small cysts are exophytic from the mid right kidney. Urinary bladder is distended without wall thickening. Stomach/Bowel: Small hiatal hernia. Ingested material distends the stomach. There is no bowel obstruction or inflammation. Normal appendix. Colonic diverticulosis involving the descending and sigmoid colon without diverticulitis. Vascular/Lymphatic: Aorto bi-iliac atherosclerosis, mild. Small retroperitoneal nodes are not enlarged by size criteria. Reproductive: Enlarged prostate gland spanning 5.6 cm transverse. Other: Left retroperitoneal stranding adjacent to the left kidney. No ascites. No free air. There is fat within both inguinal canals, more prominent on the left. Musculoskeletal: Degenerative change in the spine with primarily facet hypertrophy. No focal bone lesion. IMPRESSION: 1. Subcapsular hematoma involving the left kidney measuring up to 3.1 cm. Renal hemorrhage causes mass effect on and compression of the renal parenchyma, (Page kidney). Source of hemorrhage is likely a small left renal lesion demonstrated on prior contrast-enhanced exam that is not well seen currently. 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 2. Enlarged prostate gland. 3. Colonic diverticulosis without diverticulitis. 4. Cholelithiasis without gallbladder inflammation. Aortic Atherosclerosis (ICD10-I70.0). These results were called by telephone at the time of interpretation on 02/21/2020 at 11:14 pm to Dr [facility] Tyrone Nine , who verbally acknowledged these results. Electronically Signed   By: Keith Rake M.D.   On: 02/21/2020 23:15    Procedures Procedures (including critical care time)  Medications Ordered in ED Medications  ondansetron (ZOFRAN) injection 4 mg (4 mg  Intravenous Given 02/21/20 2147)  ondansetron (ZOFRAN) injection 4 mg (4 mg Intravenous Given 02/21/20 2253)  ketorolac (TORADOL) injection 15 mg (15 mg Intramuscular Given 02/21/20 2253)  fentaNYL (SUBLIMAZE) injection 25 mcg (25 mcg Intravenous Given 02/21/20 2249)  sodium chloride 0.9 % bolus 1,000 mL (1,000 mLs Intravenous New Bag/Given 02/21/20 2255)    ED Course  I have reviewed the triage vital signs and the nursing notes.  Pertinent labs & imaging results that were available during my care of the patient were reviewed by me and considered in my medical decision making (see chart for details).    MDM Rules/Calculators/A&P                          64 yo M with a chief complaint of left flank pain.  Symptoms are concerning for kidney stone based on history and physical.  Stone study ordered.  Given pain medicine and Toradol with significant improvement.  No stone was found but the patient does have a spontaneous subcapsular hematoma to the left kidney.  I discussed this with the radiologist.  Has been discussed it with Dr. Junious Silk, urology.  Recommended follow-up in the office and likely will repeat imaging in about 6 weeks.  Discussed with the patient.  Will discharge home.  11:42 PM:  I have discussed the diagnosis/risks/treatment options with the patient and family and believe the pt to be eligible for discharge home to follow-up with Urology. We also discussed returning to the ED immediately if new or worsening sx occur. We discussed the sx which are most concerning (e.g., sudden worsening pain, fever, inability to tolerate by  mouth) that necessitate immediate return. Medications administered to the patient during their visit and any new prescriptions provided to the patient are listed below.  Medications given during this visit Medications  ondansetron (ZOFRAN) injection 4 mg (4 mg Intravenous Given 02/21/20 2147)  ondansetron (ZOFRAN) injection 4 mg (4 mg Intravenous Given 02/21/20 2253)    ketorolac (TORADOL) injection 15 mg (15 mg Intramuscular Given 02/21/20 2253)  fentaNYL (SUBLIMAZE) injection 25 mcg (25 mcg Intravenous Given 02/21/20 2249)  sodium chloride 0.9 % bolus 1,000 mL (1,000 mLs Intravenous New Bag/Given 02/21/20 2255)     The patient appears reasonably screen and/or stabilized for discharge and I doubt any other medical condition or other [facility] requiring further screening, evaluation, or treatment in the ED at this time prior to discharge.   Final Clinical Impression(s) / ED Diagnoses Final diagnoses:  Hematoma of left kidney, initial encounter    Rx / DC Orders ED Discharge Orders    None       Deno Etienne, DO 02/21/20 2342

## 2020-02-21 NOTE — ED Triage Notes (Signed)
Pt reports sudden onset abdominal pain and back pain. Reports some nausea. Concerned for kidney stones. One episode of vomiting.

## 2020-02-21 NOTE — Patient Instructions (Addendum)
Thanks for doing labs!  If you have mychart- we will send your results within 3 business days of [place] receiving them.  If you do not have mychart- we will call you about results within 5 business days of [place] receiving them.   If a1c still over 8- consider ozempic instead of januvia  Recommended follow up: Return in about 14 weeks (around 05/29/2020) for follow up- or sooner if needed.  - shingrix  #2 with next visit

## 2020-02-22 ENCOUNTER — Encounter: Payer: Self-pay | Admitting: Family Medicine

## 2020-02-22 ENCOUNTER — Other Ambulatory Visit: Payer: Self-pay

## 2020-02-22 ENCOUNTER — Other Ambulatory Visit: Payer: Self-pay | Admitting: Family Medicine

## 2020-02-22 ENCOUNTER — Telehealth: Payer: Self-pay

## 2020-02-22 DIAGNOSIS — R9389 Abnormal findings on diagnostic imaging of other specified body structures: Secondary | ICD-10-CM

## 2020-02-22 LAB — CBC WITH DIFFERENTIAL/PLATELET
Basophils Absolute: 0.1 10*3/uL (ref 0.0–0.1)
Basophils Relative: 0.7 % (ref 0.0–3.0)
Eosinophils Absolute: 0.6 10*3/uL (ref 0.0–0.7)
Eosinophils Relative: 4.4 % (ref 0.0–5.0)
HCT: 41.5 % (ref 39.0–52.0)
Hemoglobin: 14.1 g/dL (ref 13.0–17.0)
Lymphocytes Relative: 22 % (ref 12.0–46.0)
Lymphs Abs: 2.7 10*3/uL (ref 0.7–4.0)
MCHC: 34 g/dL (ref 30.0–36.0)
MCV: 84.8 fl (ref 78.0–100.0)
Monocytes Absolute: 0.9 10*3/uL (ref 0.1–1.0)
Monocytes Relative: 7.4 % (ref 3.0–12.0)
Neutro Abs: 8.2 10*3/uL — ABNORMAL HIGH (ref 1.4–7.7)
Neutrophils Relative %: 65.5 % (ref 43.0–77.0)
Platelets: 344 10*3/uL (ref 150.0–400.0)
RBC: 4.89 Mil/uL (ref 4.22–5.81)
RDW: 13.2 % (ref 11.5–15.5)
WBC: 12.5 10*3/uL — ABNORMAL HIGH (ref 4.0–10.5)

## 2020-02-22 LAB — COMPREHENSIVE METABOLIC PANEL
ALT: 10 U/L (ref 0–53)
AST: 7 U/L (ref 0–37)
Albumin: 4.2 g/dL (ref 3.5–5.2)
Alkaline Phosphatase: 80 U/L (ref 39–117)
BUN: 16 mg/dL (ref 6–23)
CO2: 26 mEq/L (ref 19–32)
Calcium: 9.6 mg/dL (ref 8.4–10.5)
Chloride: 101 mEq/L (ref 96–112)
Creatinine, Ser: 0.88 mg/dL (ref 0.40–1.50)
GFR: 87 mL/min (ref >60.00)
Glucose, Bld: 88 mg/dL (ref 70–99)
Potassium: 3.9 mEq/L (ref 3.5–5.1)
Sodium: 138 mEq/L (ref 135–145)
Total Bilirubin: 0.4 mg/dL (ref 0.2–1.2)
Total Protein: 7.1 g/dL (ref 6.0–8.3)

## 2020-02-22 LAB — LIPID PANEL
Cholesterol: 97 mg/dL (ref 0–200)
HDL: 27.6 mg/dL — ABNORMAL LOW (ref >39.00)
LDL Cholesterol: 34 mg/dL (ref 0–99)
NonHDL: 69.01
Total CHOL/HDL Ratio: 4
Triglycerides: 175 mg/dL — ABNORMAL HIGH (ref 0.0–149.0)
VLDL: 35 mg/dL (ref 0.0–40.0)

## 2020-02-22 LAB — HEMOGLOBIN A1C: Hgb A1c MFr Bld: 8.8 % — ABNORMAL HIGH (ref 4.6–6.5)

## 2020-02-22 LAB — PSA: PSA: 0.97 ng/mL (ref 0.10–4.00)

## 2020-02-22 MED ORDER — OZEMPIC (1 MG/DOSE) 2 MG/1.5ML ~~LOC~~ SOPN: 1.0000 mg | PEN_INJECTOR | SUBCUTANEOUS | 5 refills | Status: DC

## 2020-02-22 MED ORDER — OZEMPIC (0.25 OR 0.5 MG/DOSE) 2 MG/1.5ML ~~LOC~~ SOPN: PEN_INJECTOR | SUBCUTANEOUS | 0 refills | Status: DC

## 2020-02-22 NOTE — Telephone Encounter (Signed)
Pt went to ED last night for back and abdominal pain. Test results showed some kidney abnormalities. He would like a referral to a urologist. Please advise

## 2020-02-22 NOTE — Telephone Encounter (Signed)
Called patient let him know referral has been placed

## 2020-02-22 NOTE — Telephone Encounter (Signed)
In the urgent referral under Subcapsular hematoma involving the left kidney

## 2020-02-22 NOTE — Telephone Encounter (Signed)
Ok to start referral?  ?

## 2020-02-25 ENCOUNTER — Other Ambulatory Visit: Payer: Self-pay

## 2020-02-25 DIAGNOSIS — D49512 Neoplasm of unspecified behavior of left kidney: Secondary | ICD-10-CM | POA: Diagnosis not present

## 2020-02-25 DIAGNOSIS — D72819 Decreased white blood cell count, unspecified: Secondary | ICD-10-CM

## 2020-02-25 DIAGNOSIS — S37092S Other injury of left kidney, sequela: Secondary | ICD-10-CM | POA: Diagnosis not present

## 2020-02-25 DIAGNOSIS — Z125 Encounter for screening for malignant neoplasm of prostate: Secondary | ICD-10-CM | POA: Diagnosis not present

## 2020-03-01 NOTE — Addendum Note (Signed)
Addended by: Francella Solian on: 03/01/2020 11:36 AM   Modules accepted: Orders

## 2020-03-05 ENCOUNTER — Other Ambulatory Visit: Payer: Self-pay

## 2020-03-05 ENCOUNTER — Ambulatory Visit (INDEPENDENT_AMBULATORY_CARE_PROVIDER_SITE_OTHER): Payer: BC Managed Care – PPO | Admitting: Primary Care

## 2020-03-05 ENCOUNTER — Encounter: Payer: Self-pay | Admitting: Primary Care

## 2020-03-05 DIAGNOSIS — G4733 Obstructive sleep apnea (adult) (pediatric): Secondary | ICD-10-CM

## 2020-03-05 DIAGNOSIS — Q859 Phakomatosis, unspecified: Secondary | ICD-10-CM | POA: Diagnosis not present

## 2020-03-05 NOTE — Patient Instructions (Addendum)
Pulmonary nodule: - CT chest 02/13/20 showed central left lower lobe pulmonary lesion, measuring 2.7cm x 2.7cm (originally 1.9cm in 2016). Will discuss with Dr. Elsworth Soho, may consider repeating PET scan in 3-6 months  Sleep apnea: - Continue to work on maintaining healthy weight - Recommend side sleeping position or elevated head of bed   Follow-up: - 1 year with Dr. Elsworth Soho or sooner if needed      Sleep Apnea Sleep apnea affects breathing during sleep. It causes breathing to stop for a short time or to become shallow. It can also increase the risk of:  Heart attack.  Stroke.  Being very overweight (obese).  Diabetes.  Heart failure.  Irregular heartbeat. The goal of treatment is to help you breathe normally again. What are the causes? There are three kinds of sleep apnea:  Obstructive sleep apnea. This is caused by a blocked or collapsed airway.  Central sleep apnea. This happens when the brain does not send the right signals to the muscles that control breathing.  Mixed sleep apnea. This is a combination of obstructive and central sleep apnea. The most common cause of this condition is a collapsed or blocked airway. This can happen if:  Your throat muscles are too relaxed.  Your tongue and tonsils are too large.  You are overweight.  Your airway is too small. What increases the risk?  Being overweight.  Smoking.  Having a small airway.  Being older.  Being male.  Drinking alcohol.  Taking medicines to calm yourself (sedatives or tranquilizers).  Having family members with the condition. What are the signs or symptoms?  Trouble staying asleep.  Being sleepy or tired during the day.  Getting angry a lot.  Loud snoring.  Headaches in the morning.  Not being able to focus your mind (concentrate).  Forgetting things.  Less interest in sex.  Mood swings.  Personality changes.  Feelings of sadness (depression).  Waking up a lot during the night  to pee (urinate).  Dry mouth.  Sore throat. How is this diagnosed?  Your medical history.  A physical exam.  A test that is done when you are sleeping (sleep study). The test is most often done in a sleep lab but may also be done at home. How is this treated?   Sleeping on your side.  Using a medicine to get rid of mucus in your nose (decongestant).  Avoiding the use of alcohol, medicines to help you relax, or certain pain medicines (narcotics).  Losing weight, if needed.  Changing your diet.  Not smoking.  Using a machine to open your airway while you sleep, such as: ? An oral appliance. This is a mouthpiece that shifts your lower jaw forward. ? A CPAP device. This device blows air through a mask when you breathe out (exhale). ? An EPAP device. This has valves that you put in each nostril. ? A BPAP device. This device blows air through a mask when you breathe in (inhale) and breathe out.  Having surgery if other treatments do not work. It is important to get treatment for sleep apnea. Without treatment, it can lead to:  High blood pressure.  Coronary artery disease.  In men, not being able to have an erection (impotence).  Reduced thinking ability. Follow these instructions at home: Lifestyle  Make changes that your doctor recommends.  Eat a healthy diet.  Lose weight if needed.  Avoid alcohol, medicines to help you relax, and some pain medicines.  Do not use  any products that contain nicotine or tobacco, such as cigarettes, e-cigarettes, and chewing tobacco. If you need help quitting, ask your doctor. General instructions  Take over-the-counter and prescription medicines only as told by your doctor.  If you were given a machine to use while you sleep, use it only as told by your doctor.  If you are having surgery, make sure to tell your doctor you have sleep apnea. You may need to bring your device with you.  Keep all follow-up visits as told by your  doctor. This is important. Contact a doctor if:  The machine that you were given to use during sleep bothers you or does not seem to be working.  You do not get better.  You get worse. Get help right away if:  Your chest hurts.  You have trouble breathing in enough air.  You have an uncomfortable feeling in your back, arms, or stomach.  You have trouble talking.  One side of your body feels weak.  A part of your face is hanging down. These symptoms may be an emergency. Do not wait to see if the symptoms will go away. Get medical help right away. Call your local emergency services (911 in the [place] Do not drive yourself to the hospital. Summary  This condition affects breathing during sleep.  The most common cause is a collapsed or blocked airway.  The goal of treatment is to help you breathe normally while you sleep. This information is not intended to replace advice given to you by your health care provider. Make sure you discuss any questions you have with your health care provider. Document Revised: 05/12/2018 Document Reviewed: 03/21/2018 Elsevier Patient Education  [place].

## 2020-03-05 NOTE — Progress Notes (Addendum)
@Patient  ID: Larry Garner, male    DOB: 06/04/1956, 64 y.o.   MRN: [id number]  Chief Complaint  Patient presents with  . Follow-up    osa on cpap, pulm nodule    Referring provider: Marin Olp, MD  HPI: 64 year old male, former smoker quit 1979 (2-pack-year history).  Past medical history significant for obstructive sleep apnea, solitary pulmonary nodule, intermittent asthma.  Patient of Dr. Elsworth Soho, patient last seen in March 2020.   He had a left lower lobe nodule first noted in 2016About 19 mm that was not hypermetabolic on PET scan and clarified on follow-up to be a hamartoma in 2018,slight increase in size to 22 mm and stable in 2019  03/05/2020 Patient presents today for regular follow-up. He has no respiratory complaints. Repeat CT chest in July showed enlarged LLL pulmonary lesion measuring 2.7cm x 2.7cm. Additional small pulmonary nodules remain stable. Patient recently had cyst in his kidney rupture 2 weeks ago, he has been seen by urology and will follow-up with them in 6 months. Pain has subsided. He is not currently wearing CPAP. He had mild OSA on sleep study in 2019. He has difficulty sleeping comfortably with mask. He often gets fragmented sleep waking up at 2am and needed to move to the couch and turn the television on to fall back asleep. He notices no difference in sleep qualify when he wears CPAP or doesn't wear it. He has a lot of stress with work and is likely retiring in fall 2022.   [facility]view download 02/03/20- 03/03/20: 2/30 days used (10%); 3% > 4 hours Average usage days used 2 hours 34 mins Pressure 5-12cm h20 (7cm h20- 95%) AHI 1.2  Significant tests/ events reviewed HST 01/2018 AHI 11/h, worse in supine  Imaging:  06/30/2015 PET- Non hypermetabolic 1.9 cm left lower lobe pulmonary nodule, suggesting a benign etiology. Two right lower lobe subcentimeter pulmonary nodules without hypermetabolism, although these are below PET resolution  01/2018 CT  chest w contrast - 2.2 cm LLL nodule, stable compared to 11/2016   02/13/2020 CT chest wo contrast- Central LLL pulmonary lesion 2.7 x 2.7 cm (increased from 2.3 x 2.3 cm previously).  Lesion remains relatively homogeneous and well defined.  Additionally he has several other small pulmonary nodules which remain stable in size largest measuring 5 mm right lower lobe.  Allergies  Allergen Reactions  . Morphine Sulfate Nausea And Vomiting and Swelling    Immunization History  Administered Date(s) Administered  . Influenza Split 05/12/2011, 04/25/2012  . Influenza Whole 05/17/2007, 05/10/2008, 06/06/2009, 06/10/2010  . Influenza,inj,Quad PF,6+ Mos 05/16/2013, 05/27/2014, 05/29/2015, 05/11/2016, 05/09/2017, 05/09/2018, 05/04/2019  . PFIZER SARS-COV-2 Vaccination 09/15/2019, 10/09/2019  . Pneumococcal Conjugate-13 08/14/2014  . Pneumococcal Polysaccharide-23 11/05/2015  . Td 08/09/2002  . Tdap 10/20/2012  . Zoster 07/18/2015  . Zoster Recombinat (Shingrix) 02/21/2020    Past Medical History:  Diagnosis Date  . Allergic rhinitis   . Allergy   . Anxiety   . Asthma   . Diabetes ([place])   . Diverticulitis   . GERD (gastroesophageal reflux disease)    very occ  . History of heart attack   . HLD (hyperlipidemia)   . HTN (hypertension)   . Myocardial infarction ([place]) 2000  . Sleep apnea    wears cpap   . Tubular adenoma of colon 09/2008    Tobacco History: Social History   Tobacco Use  Smoking Status Former Smoker  . Packs/day: 0.50  . Years: 4.00  . Pack years:  2.00  . Types: Cigarettes  . Quit date: 09/29/1977  . Years since quitting: 42.4  Smokeless Tobacco Never Used  Tobacco Comment   quit on 1980   Counseling given: Not Answered Comment: quit on 1980   Outpatient Medications Prior to Visit  Medication Sig Dispense Refill  . albuterol (PROVENTIL HFA;VENTOLIN HFA) 108 (90 Base) MCG/ACT inhaler Inhale 2 puffs into the lungs every 6 (six) hours as needed for wheezing or  shortness of breath. 1 Inhaler 5  . ALPRAZolam (XANAX) 0.5 MG tablet Take 1 tablet (0.5 mg total) by mouth every 6 (six) hours as needed. 30 tablet 3  . amLODipine (NORVASC) 5 MG tablet TAKE 1 TABLET BY MOUTH EVERY DAY 90 tablet 1  . aspirin EC 81 MG tablet Take 1 tablet (81 mg total) by mouth daily. 90 tablet 3  . Azelastine-Fluticasone 137-50 MCG/ACT SUSP Place 1 spray into the nose as needed.    . bisoprolol-hydrochlorothiazide (ZIAC) 10-6.25 MG tablet TAKE 1 TABLET BY MOUTH EVERY DAY 90 tablet 1  . famciclovir (FAMVIR) 500 MG tablet TAKE 3 TABLETS BY MOUTH EVERY DAY AS NEEDED AT FIRST SIGN OF OUTBREAK OF FEVER BLISTER 15 tablet 1  . fluticasone-salmeterol (ADVAIR HFA) 45-21 MCG/ACT inhaler Inhale 2 puffs into the lungs 2 (two) times daily. 1 Inhaler 2  . folic acid (FOLVITE) 338 MCG tablet Take 1,600 mcg by mouth daily.      Marland Kitchen glimepiride (AMARYL) 2 MG tablet TAKE 1 TABLET (2 MG TOTAL) BY MOUTH DAILY BEFORE BREAKFAST. 90 tablet 3  . glucose blood (BAYER CONTOUR NEXT TEST) test strip Use to test blood sugars daily. Dx: E11.9 100 each 12  . INVOKAMET [phone] MG TABS TAKE 1 TABLET TWICE A DAY 180 tablet 3  . losartan (COZAAR) 100 MG tablet TAKE 1 TABLET BY MOUTH EVERY DAY 90 tablet 3  . Multiple Vitamin (MULTIVITAMIN) tablet Take 1 tablet by mouth daily.      . nitroGLYCERIN (NITROSTAT) 0.4 MG SL tablet PLACE 1 TABLET UNDER THE TONGUE EVERY 5 MINUTES AS NEEDED FOR CHEST PAIN. 25 tablet 1  . omega-3 acid ethyl esters (LOVAZA) 1 g capsule Take 1 capsule (1 g total) by mouth 3 (three) times daily. 270 capsule 3  . oxymetazoline (AFRIN) 0.05 % nasal spray Place 1 spray into both nostrils 2 (two) times daily as needed for congestion.    . rosuvastatin (CRESTOR) 20 MG tablet TAKE 1 TABLET BY MOUTH DAILY 90 tablet 4  . Semaglutide, 1 MG/DOSE, (OZEMPIC, 1 MG/DOSE,) 2 MG/1.5ML SOPN Inject 0.75 mLs (1 mg total) into the skin once a week. After starter pack 2 pen 5  . Semaglutide,0.25 or 0.5MG /DOS,  (OZEMPIC, 0.25 OR 0.5 MG/DOSE,) 2 MG/1.5ML SOPN Inject 0.1875 mLs (0.25 mg total) as directed once a week for 28 days, THEN 0.375 mLs (0.5 mg total) once a week for 14 days. Starter pack. 1.5 mL 0  . zaleplon (SONATA) 10 MG capsule TAKE 1 CAPSULE (10 MG TOTAL) BY MOUTH AT BEDTIME AS NEEDED. FOR SLEEP 30 capsule 5   No facility-administered medications prior to visit.    Review of Systems  Review of Systems  Constitutional:       Intermittent low grade fever  Respiratory: Negative.   Cardiovascular: Negative.   Psychiatric/Behavioral: Positive for sleep disturbance.    Physical Exam  BP (!) 130/70 (BP Location: Left Arm, Cuff Size: Normal)   Pulse 71   Temp (!) 97.3 F (36.3 C) (Oral)   Ht 5\' 11"  (1.803  m)   Wt 202 lb 6.4 oz (91.8 kg)   SpO2 98%   BMI 28.23 kg/m  Physical Exam Constitutional:      Appearance: Normal appearance.  HENT:     Head: Normocephalic and atraumatic.  Cardiovascular:     Rate and Rhythm: Normal rate and regular rhythm.  Pulmonary:     Effort: Pulmonary effort is normal.     Breath sounds: Normal breath sounds.  Skin:    General: Skin is dry.  Neurological:     General: No focal deficit present.     Mental Status: He is alert and oriented to person, place, and time. Mental status is at baseline.  Psychiatric:        Mood and Affect: Mood normal.        Behavior: Behavior normal.        Thought Content: Thought content normal.        Judgment: Judgment normal.      Lab Results:  CBC    Component Value Date/Time   WBC 14.3 (H) 02/21/2020 2145   RBC 4.96 02/21/2020 2145   HGB 14.2 02/21/2020 2145   HCT 42.0 02/21/2020 2145   PLT 329 02/21/2020 2145   MCV 84.7 02/21/2020 2145   MCH 28.6 02/21/2020 2145   MCHC 33.8 02/21/2020 2145   RDW 12.9 02/21/2020 2145   LYMPHSABS 2.7 02/21/2020 1652   MONOABS 0.9 02/21/2020 1652   EOSABS 0.6 02/21/2020 1652   BASOSABS 0.1 02/21/2020 1652    BMET    Component Value Date/Time   NA 136  02/21/2020 2145   K 3.7 02/21/2020 2145   CL 101 02/21/2020 2145   CO2 24 02/21/2020 2145   GLUCOSE 242 (H) 02/21/2020 2145   BUN 16 02/21/2020 2145   CREATININE 1.03 02/21/2020 2145   CREATININE 0.88 07/19/2016 0939   CALCIUM 9.0 02/21/2020 2145   GFRNONAA >60 02/21/2020 2145   GFRAA >60 02/21/2020 2145    BNP No results found for: BNP  ProBNP No results found for: PROBNP  Imaging: CT Chest W Contrast  Result Date: 02/13/2020 CLINICAL DATA:  Left lower lobe pulmonary nodule. EXAM: CT CHEST WITH CONTRAST TECHNIQUE: Multidetector CT imaging of the chest was performed during intravenous contrast administration. CONTRAST:  49mL OMNIPAQUE IOHEXOL 300 MG/ML  SOLN COMPARISON:  02/08/2019 FINDINGS: Cardiovascular: The heart size is normal. No substantial pericardial effusion. Status post CABG. Coronary artery calcification is evident. No thoracic aortic aneurysm. Mediastinum/Nodes: No mediastinal lymphadenopathy. There is no hilar lymphadenopathy. The esophagus has normal imaging features. There is no axillary lymphadenopathy. Lungs/Pleura: Central left lower lobe pulmonary lesion measures 2.7 x 2.7 cm today, increased from 2.3 x 2.3 cm previously. Lesion remains relatively homogeneous and well-defined. Stable 3 mm right middle lobe pulmonary nodule. 5 mm right lower lobe nodule on 01/20 2/3 is stable. No focal airspace consolidation. No pleural effusion. Upper Abdomen: Unremarkable. Musculoskeletal: No worrisome lytic or sclerotic osseous abnormality. IMPRESSION: 1. Continued further increase in size of the central left lower lobe pulmonary lesion, now measuring 2.7 x 2.7 cm today, previously 2.3 x 2.3 cm. This nodule was evaluated by CT 06/30/2015 and found to be non hypermetabolic. Given continued progression, indolent neoplasm remains a consideration. 2. Stable tiny bilateral pulmonary nodules. 3. Aortic Atherosclerosis (ICD10-I70.0). Electronically Signed   By: Misty Stanley M.D.   On: 02/13/2020  16:28   CT Renal Stone Study  Result Date: 02/21/2020 CLINICAL DATA:  Sudden onset of abdominal and back pain.  Nausea. Flank  pain, kidney stone suspected EXAM: CT ABDOMEN AND PELVIS WITHOUT CONTRAST TECHNIQUE: Multidetector CT imaging of the abdomen and pelvis was performed following the standard protocol without IV contrast. COMPARISON:  Contrast-enhanced exam 06/06/2015 FINDINGS: Lower chest: The lung bases are clear. Hepatobiliary: No evidence of focal hepatic lesion on noncontrast exam. Intraluminal gallstone without pericholecystic inflammation or biliary dilatation. Pancreas: No ductal dilatation or inflammation. Spleen: Normal in size without focal abnormality. Adrenals/Urinary Tract: Normal adrenal glands. There is a subcapsular hematoma involving the left kidney with hyperdense crescentic fluid collection. This measures up to 3.1 cm about the inferior kidney. 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 Hematoma causes compression of the renal parenchyma. There is moderate left perinephric edema. No renal stone. There is no hydronephrosis. Minimal stranding about the right kidney without stone or hydronephrosis. Two small cysts are exophytic from the mid right kidney. Urinary bladder is distended without wall thickening. Stomach/Bowel: Small hiatal hernia. Ingested material distends the stomach. There is no bowel obstruction or inflammation. Normal appendix. Colonic diverticulosis involving the descending and sigmoid colon without diverticulitis. Vascular/Lymphatic: Aorto bi-iliac atherosclerosis, mild. Small retroperitoneal nodes are not enlarged by size criteria. Reproductive: Enlarged prostate gland spanning 5.6 cm transverse. Other: Left retroperitoneal stranding adjacent to the left kidney. No ascites. No free air. There is fat within both inguinal canals, more prominent on the  left. Musculoskeletal: Degenerative change in the spine with primarily facet hypertrophy. No focal bone lesion. IMPRESSION: 1. Subcapsular hematoma involving the left kidney measuring up to 3.1 cm. Renal hemorrhage causes mass effect on and compression of the renal parenchyma, (Page kidney). Source of hemorrhage is likely a small left renal lesion demonstrated on prior contrast-enhanced exam that is not well seen currently. 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 2. Enlarged prostate gland. 3. Colonic diverticulosis without diverticulitis. 4. Cholelithiasis without gallbladder inflammation. Aortic Atherosclerosis (ICD10-I70.0). These results were called by telephone at the time of interpretation on 02/21/2020 at 11:14 pm to Dr [facility] Tyrone Nine , who verbally acknowledged these results. Electronically Signed   By: Keith Rake M.D.   On: 02/21/2020 23:15     Assessment & Plan:   Hamartoma ([place]) - CT chest 02/13/20 showed slowly enlarging central left lower lobe pulmonary lesion, measuring 2.7cm x 2.7cm (originally 1.9cm in 2016) - Will discuss repeating PET scan in 3-6 months with Dr. Elsworth Soho as it has been >5 years since last scan. If lesion was hypermetabolic next step would likely be to get Super-D imaging for possible navigation bronchoscopy   OSA (obstructive sleep apnea) - Mild OSA; HST in 2019 showed AHI 11/hr - Not wearing CPAP  - Encourage weight loss and side sleeping position    Martyn Ehrich, NP 03/05/2020

## 2020-03-05 NOTE — Assessment & Plan Note (Addendum)
-   CT chest 02/13/20 showed slowly enlarging central left lower lobe pulmonary lesion, measuring 2.7cm x 2.7cm (originally 1.9cm in 2016) - Will discuss repeating PET scan in 3-6 months with Dr. Elsworth Soho as it has been >5 years since last scan. If lesion was hypermetabolic next step would likely be to get Super-D imaging for possible navigation bronchoscopy

## 2020-03-05 NOTE — Assessment & Plan Note (Addendum)
-   Mild OSA; HST in 2019 showed AHI 11/hr - Not wearing CPAP  - Encourage weight loss and side sleeping position

## 2020-03-07 ENCOUNTER — Encounter: Payer: Self-pay | Admitting: Family Medicine

## 2020-03-07 ENCOUNTER — Other Ambulatory Visit: Payer: Self-pay | Admitting: Family Medicine

## 2020-03-07 NOTE — Telephone Encounter (Signed)
LR: 08-13-2019 Qty: 30 w 5 refills  Last office visit: 02-21-2020 Upcoming appointment: 03-25-2020

## 2020-03-25 ENCOUNTER — Other Ambulatory Visit: Payer: BC Managed Care – PPO

## 2020-03-25 ENCOUNTER — Other Ambulatory Visit: Payer: Self-pay

## 2020-03-25 DIAGNOSIS — D72819 Decreased white blood cell count, unspecified: Secondary | ICD-10-CM | POA: Diagnosis not present

## 2020-03-25 LAB — CBC WITH DIFFERENTIAL/PLATELET
Absolute Monocytes: 927 cells/uL (ref 200–950)
Basophils Absolute: 65 cells/uL (ref 0–200)
Basophils Relative: 0.6 %
Eosinophils Absolute: 632 cells/uL — ABNORMAL HIGH (ref 15–500)
Eosinophils Relative: 5.8 %
HCT: 37.1 % — ABNORMAL LOW (ref 38.5–50.0)
Hemoglobin: 12.3 g/dL — ABNORMAL LOW (ref 13.2–17.1)
Lymphs Abs: 1799 cells/uL (ref 850–3900)
MCH: 27.3 pg (ref 27.0–33.0)
MCHC: 33.2 g/dL (ref 32.0–36.0)
MCV: 82.3 fL (ref 80.0–100.0)
MPV: 9.8 fL (ref 7.5–12.5)
Monocytes Relative: 8.5 %
Neutro Abs: 7477 cells/uL (ref 1500–7800)
Neutrophils Relative %: 68.6 %
Platelets: 407 10*3/uL — ABNORMAL HIGH (ref 140–400)
RBC: 4.51 10*6/uL (ref 4.20–5.80)
RDW: 12.9 % (ref 11.0–15.0)
Total Lymphocyte: 16.5 %
WBC: 10.9 10*3/uL — ABNORMAL HIGH (ref 3.8–10.8)

## 2020-03-25 NOTE — Addendum Note (Signed)
Addended by: Liliane Channel on: 03/25/2020 08:09 AM   Modules accepted: Orders

## 2020-03-27 ENCOUNTER — Other Ambulatory Visit: Payer: Self-pay

## 2020-03-27 ENCOUNTER — Telehealth: Payer: Self-pay | Admitting: Family Medicine

## 2020-03-27 DIAGNOSIS — D649 Anemia, unspecified: Secondary | ICD-10-CM

## 2020-03-27 NOTE — Telephone Encounter (Signed)
Order placed

## 2020-03-27 NOTE — Telephone Encounter (Signed)
Patient called in to schedule another lab visit, scheduled for 04/28/20 but there were no orders in.

## 2020-03-28 ENCOUNTER — Other Ambulatory Visit: Payer: BC Managed Care – PPO

## 2020-03-30 ENCOUNTER — Other Ambulatory Visit: Payer: Self-pay | Admitting: Family Medicine

## 2020-04-02 ENCOUNTER — Encounter: Payer: Self-pay | Admitting: Family Medicine

## 2020-04-02 DIAGNOSIS — Z20822 Contact with and (suspected) exposure to covid-19: Secondary | ICD-10-CM | POA: Diagnosis not present

## 2020-04-03 ENCOUNTER — Other Ambulatory Visit: Payer: Self-pay | Admitting: Family Medicine

## 2020-04-03 MED ORDER — OZEMPIC (1 MG/DOSE) 2 MG/1.5ML ~~LOC~~ SOPN: 1.0000 mg | PEN_INJECTOR | SUBCUTANEOUS | 5 refills | Status: DC

## 2020-04-09 DIAGNOSIS — Z20822 Contact with and (suspected) exposure to covid-19: Secondary | ICD-10-CM | POA: Diagnosis not present

## 2020-04-22 NOTE — Progress Notes (Signed)
Cardiology Office Note   Date:  05/06/2020   ID:  [name], [name] 11-27-1955, MRN [id number]  PCP:  Marin Olp, MD  Cardiologist:  Dr. Johnsie Cancel, MD   No chief complaint on file.    History of Present Illness: Larry Garner is a 64 y.o. male  has a hx of CAD s/p PCI to LAD with restenosis, subsequently requiring CABG 06/19/1999. Last cath in 2017 with total occlusion in LAD stent, total occlusion of OM #1/ramus intermediate, 60-70% 1st diag, total occlusion of native RCA with LVEF of 45-50%. Recommendations at that time were for aggressive risk factor modification given the absence of CV symptoms. CTO recanalization was not pursued.  He had left to right and right to right collaterals to his distal RCA He had mild inferior peri infarct ischemia on myovue 07/14/16 before his cath  He denies chest pain, SOB, LE swelling, palpitations or syncope. He states he continues to walk 2 miles with his wife per day when he is working from home without anginal symptoms. 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 Would like any maintenance testing to be performed prior to his retirement if needed.  Last cardiac cath to follow his 66 year old grafts was in 2017. His DM is poorly controlled and LDL at goal on statin   He has had a lot of medical issues this year. Weight loss. Diabetic med changes.  Subcapsular hematoma of left Kidney 02/21/20 and increasing size of left lower lobe pulmonary lesion   No angina or cardiac symptoms   Past Medical History:  Diagnosis Date  . Allergic rhinitis   . Allergy   . Anxiety   . Asthma   . Diabetes ([place])   . Diverticulitis   . GERD (gastroesophageal reflux disease)    very occ  . History of heart attack   . HLD (hyperlipidemia)   . HTN (hypertension)   . Myocardial infarction ([place]) 2000  . Sleep apnea    wears cpap   . Tubular adenoma of colon 09/2008    Past Surgical History:  Procedure Laterality Date  . CARDIAC  CATHETERIZATION N/A 07/20/2016   Procedure: Left Heart Cath and Cors/Grafts Angiography;  Surgeon: Belva Crome, MD;  Location: [facility];  Service: Cardiovascular;  Laterality: N/A;  . CATARACT EXTRACTION Bilateral 06/06/2019   Dr. Tommy Rainwater. oct left, dec right 2020   . COLONOSCOPY    . CORONARY ANGIOPLASTY    . CORONARY ARTERY BYPASS GRAFT  06/1999  . POLYPECTOMY    . TONSILLECTOMY     late50's early 45's     Current Outpatient Medications  Medication Sig Dispense Refill  . albuterol (PROVENTIL HFA;VENTOLIN HFA) 108 (90 Base) MCG/ACT inhaler Inhale 2 puffs into the lungs every 6 (six) hours as needed for wheezing or shortness of breath. 1 Inhaler 5  . ALPRAZolam (XANAX) 0.5 MG tablet Take 1 tablet (0.5 mg total) by mouth every 6 (six) hours as needed. 30 tablet 3  . amLODipine (NORVASC) 5 MG tablet TAKE 1 TABLET BY MOUTH EVERY DAY 90 tablet 1  . aspirin EC 81 MG tablet Take 1 tablet (81 mg total) by mouth daily. 90 tablet 3  . Azelastine-Fluticasone 137-50 MCG/ACT SUSP Place 1 spray into the nose as needed.    . bisoprolol-hydrochlorothiazide (ZIAC) 10-6.25 MG tablet TAKE 1 TABLET BY MOUTH EVERY DAY 90 tablet 1  . famciclovir (FAMVIR) 500 MG tablet TAKE 3 TABLETS BY  MOUTH EVERY DAY AS NEEDED AT FIRST SIGN OF OUTBREAK OF FEVER BLISTER 15 tablet 1  . fluticasone-salmeterol (ADVAIR HFA) 45-21 MCG/ACT inhaler Inhale 2 puffs into the lungs 2 (two) times daily. (Patient taking differently: Inhale 2 puffs into the lungs as needed. ) 1 Inhaler 2  . folic acid (FOLVITE) 756 MCG tablet Take 1,600 mcg by mouth daily.      Marland Kitchen glimepiride (AMARYL) 2 MG tablet TAKE 1 TABLET (2 MG TOTAL) BY MOUTH DAILY BEFORE BREAKFAST. 90 tablet 3  . glucose blood (BAYER CONTOUR NEXT TEST) test strip Use to test blood sugars daily. Dx: E11.9 100 each 12  . INVOKAMET [phone] MG TABS TAKE 1 TABLET TWICE A DAY 180 tablet 3  . losartan (COZAAR) 100 MG tablet TAKE 1 TABLET BY MOUTH EVERY DAY 90 tablet 3  .  Multiple Vitamin (MULTIVITAMIN) tablet Take 1 tablet by mouth daily.      . nitroGLYCERIN (NITROSTAT) 0.4 MG SL tablet PLACE 1 TABLET UNDER THE TONGUE EVERY 5 MINUTES AS NEEDED FOR CHEST PAIN. 25 tablet 1  . omega-3 acid ethyl esters (LOVAZA) 1 g capsule Take 1 capsule (1 g total) by mouth 3 (three) times daily. 270 capsule 3  . oxymetazoline (AFRIN) 0.05 % nasal spray Place 1 spray into both nostrils 2 (two) times daily as needed for congestion.    . rosuvastatin (CRESTOR) 20 MG tablet TAKE 1 TABLET BY MOUTH DAILY 90 tablet 4  . Semaglutide, 1 MG/DOSE, (OZEMPIC, 1 MG/DOSE,) 2 MG/1.5ML SOPN Inject 0.75 mLs (1 mg total) into the skin once a week. 3 mL 5  . zaleplon (SONATA) 10 MG capsule TAKE 1 CAPSULE (10 MG TOTAL) BY MOUTH AT BEDTIME AS NEEDED. FOR SLEEP 30 capsule 5   No current facility-administered medications for this visit.    Allergies:   Morphine sulfate    Social History:  The patient  reports that he quit smoking about 42 years ago. His smoking use included cigarettes. He has a 2.00 pack-year smoking history. He has never used smokeless tobacco. He reports current alcohol use of about 1.0 standard drink of alcohol per week. He reports that he does not use drugs.   Family History:  The patient's family history includes Alzheimer's disease in his mother; Colon cancer in his paternal aunt; Colon polyps in his father and mother; Hyperlipidemia in his father; Hypertension in his father and mother.    ROS:  Please see the history of present illness. Otherwise, review of systems are positive for none.   All other systems are reviewed and negative.    PHYSICAL EXAM: VS:  BP (!) 120/54   Pulse 86   Ht 5\' 11"  (1.803 m)   Wt 181 lb 6.4 oz (82.3 kg)   SpO2 96%   BMI 25.30 kg/m  , BMI Body mass index is 25.3 kg/m.   Affect appropriate Healthy:  appears stated age 9: normal Neck supple with no adenopathy JVP normal no bruits no thyromegaly Lungs clear with no wheezing and good  diaphragmatic motion Heart:  S1/S2 no murmur, no rub, gallop or click PMI normal post sternotomy  Abdomen: benighn, BS positve, no tenderness, no AAA no bruit.  No HSM or HJR Distal pulses intact with no bruits No edema Neuro non-focal Skin warm and dry No muscular weakness    EKG:  EKG is ordered today. The ekg ordered today demonstrates NSR with diffuse T wave abnormalities and incomplete bundle. HR 64bpm   Recent Labs: 04/30/2020: ALT 16; BUN  12; Creat 0.97; Hemoglobin 11.3; Platelets 446; Potassium 3.5; Sodium 138 05/05/2020: TSH 0.03    Lipid Panel    Component Value Date/Time   CHOL 97 02/21/2020 1652   TRIG 175.0 (H) 02/21/2020 1652   HDL 27.60 (L) 02/21/2020 1652   CHOLHDL 4 02/21/2020 1652   VLDL 35.0 02/21/2020 1652   LDLCALC 34 02/21/2020 1652   LDLDIRECT 54.0 01/16/2019 0930     Wt Readings from Last 3 Encounters:  05/06/20 181 lb 6.4 oz (82.3 kg)  04/30/20 186 lb 9.6 oz (84.6 kg)  03/05/20 202 lb 6.4 oz (91.8 kg)    Other studies Reviewed: Additional studies/ records that were reviewed today include:   LHC 07/20/2016:   The left ventricular systolic function is normal.  The left ventricular ejection fraction is 45-50% by visual estimate.  There is no mitral valve regurgitation.  Ost 1st Diag to 1st Diag lesion, 65 %stenosed.  Mid LAD lesion, 100 %stenosed.  SVG and is normal in caliber.  SVG.  LIMA.  Dist LAD lesion, 50 %stenosed.  Ost 2nd Diag to 2nd Diag lesion, 100 %stenosed.  Ramus lesion, 100 %stenosed.  Mid RCA lesion, 100 %stenosed.  Prox Graft to Dist Graft lesion, 20 %stenosed.  Origin lesion, 40 %stenosed.    Widely patent previously placed bypass grafts including LIMA to mid LAD, saphenous graft to diagonal, and saphenous vein graft to obtuse marginal #1/ramus intermedius.  Total occlusion of the proximal LAD in stent.  Total occlusion of the obtuse marginal #1/ramus intermedius  60-70% stenosis in the native first  diagonal  Total occlusion of the native mid right coronary collateralized by both right to right and left-to-right collaterals. The right coronary is dominant.  Normal left ventricular hemodynamics. LVEF 45-50 %.  Recommendations:   Continue aggressive risk factor modification.  In absence of cardiac symptoms, medical therapy seems most appropriate. Therefore, CTO recanalization will not be pursued..   ASSESSMENT AND PLAN:  1. CAD s/p CABG: -Last cath 2017 as above  -Can consider CTO of RCA if refractory angina>> denies recurrent symptoms -Continues to exercise with walking 2 miles per day without anginal symptoms -Continue ASA, bisoprolol-HCTZ, losartan   2. HLD: -Last LDL, 34 02/21/20 normal LFTls  -Continue statin and Lovaza  -Labs per PCP  3. HTN: -Stable -Continue current regimen -Labs per PCP  4. DM2: -Target HbA1c 6.5 or less -Last HbA1c, 8.8  on 02/21/20  -On Invokamet and Ozempic and amaryl   5. Primary:   - lots of concerns regarding renal/pulonary lesions as well as weight loss f/u Dr Yong Channel    Current medicines are reviewed at length with the patient today.  The patient does not have concerns regarding medicines.  The following changes have been made:  no change  Labs/ tests ordered today include: None  No orders of the defined types were placed in this encounter.   Disposition:   FU with me in a year    Signed, Jenkins Rouge, MD  05/06/2020 8:33 AM    [facility] [address], [place], [place]  [address] Phone: [phone]; Fax: [phone]

## 2020-04-28 ENCOUNTER — Other Ambulatory Visit: Payer: BC Managed Care – PPO

## 2020-04-28 ENCOUNTER — Other Ambulatory Visit: Payer: Self-pay

## 2020-04-28 DIAGNOSIS — D649 Anemia, unspecified: Secondary | ICD-10-CM | POA: Diagnosis not present

## 2020-04-28 LAB — CBC WITH DIFFERENTIAL/PLATELET
Absolute Monocytes: 927 cells/uL (ref 200–950)
Basophils Absolute: 65 cells/uL (ref 0–200)
Basophils Relative: 0.6 %
Eosinophils Absolute: 251 cells/uL (ref 15–500)
Eosinophils Relative: 2.3 %
HCT: 33.5 % — ABNORMAL LOW (ref 38.5–50.0)
Hemoglobin: 10.8 g/dL — ABNORMAL LOW (ref 13.2–17.1)
Lymphs Abs: 1373 cells/uL (ref 850–3900)
MCH: 25.5 pg — ABNORMAL LOW (ref 27.0–33.0)
MCHC: 32.2 g/dL (ref 32.0–36.0)
MCV: 79 fL — ABNORMAL LOW (ref 80.0–100.0)
MPV: 9.6 fL (ref 7.5–12.5)
Monocytes Relative: 8.5 %
Neutro Abs: 8284 cells/uL — ABNORMAL HIGH (ref 1500–7800)
Neutrophils Relative %: 76 %
Platelets: 450 10*3/uL — ABNORMAL HIGH (ref 140–400)
RBC: 4.24 10*6/uL (ref 4.20–5.80)
RDW: 13.4 % (ref 11.0–15.0)
Total Lymphocyte: 12.6 %
WBC: 10.9 10*3/uL — ABNORMAL HIGH (ref 3.8–10.8)

## 2020-04-29 ENCOUNTER — Ambulatory Visit: Payer: BC Managed Care – PPO | Attending: Internal Medicine

## 2020-04-29 DIAGNOSIS — Z23 Encounter for immunization: Secondary | ICD-10-CM

## 2020-04-29 MED FILL — PFIZER-BIONTECH COVID-19 VA: 30 | 1 days supply | Qty: 0 | Fill #0

## 2020-04-29 NOTE — Progress Notes (Signed)
   Covid-19 Vaccination Clinic  Name:  Larry Garner    MRN: [id number] DOB: 1955-10-29  04/29/2020  [name] was observed post Covid-19 immunization for 15 minutes without incident. He was provided with Vaccine Information Sheet and instruction to access the V-Safe system.  Vaccinated by Larry Garner  [name] was instructed to call 911 with any severe reactions post vaccine: Marland Kitchen Difficulty breathing  . Swelling of face and throat  . A fast heartbeat  . A bad rash all over body  . Dizziness and weakness

## 2020-04-30 ENCOUNTER — Encounter: Payer: Self-pay | Admitting: Family Medicine

## 2020-04-30 ENCOUNTER — Ambulatory Visit (INDEPENDENT_AMBULATORY_CARE_PROVIDER_SITE_OTHER): Payer: BC Managed Care – PPO | Admitting: Family Medicine

## 2020-04-30 ENCOUNTER — Other Ambulatory Visit: Payer: Self-pay

## 2020-04-30 VITALS — BP 124/80 | HR 93 | Temp 98.5°F | Resp 18 | Ht 71.0 in | Wt 186.6 lb

## 2020-04-30 DIAGNOSIS — R509 Fever, unspecified: Secondary | ICD-10-CM | POA: Diagnosis not present

## 2020-04-30 DIAGNOSIS — D649 Anemia, unspecified: Secondary | ICD-10-CM

## 2020-04-30 DIAGNOSIS — R7989 Other specified abnormal findings of blood chemistry: Secondary | ICD-10-CM

## 2020-04-30 DIAGNOSIS — R634 Abnormal weight loss: Secondary | ICD-10-CM | POA: Diagnosis not present

## 2020-04-30 DIAGNOSIS — D72829 Elevated white blood cell count, unspecified: Secondary | ICD-10-CM | POA: Diagnosis not present

## 2020-04-30 DIAGNOSIS — R700 Elevated erythrocyte sedimentation rate: Secondary | ICD-10-CM

## 2020-04-30 LAB — CBC WITH DIFFERENTIAL/PLATELET
Absolute Monocytes: 977 cells/uL — ABNORMAL HIGH (ref 200–950)
Basophils Absolute: 63 cells/uL (ref 0–200)
Basophils Relative: 0.6 %
Eosinophils Absolute: 210 cells/uL (ref 15–500)
Eosinophils Relative: 2 %
HCT: 34.6 % — ABNORMAL LOW (ref 38.5–50.0)
Hemoglobin: 11.3 g/dL — ABNORMAL LOW (ref 13.2–17.1)
Lymphs Abs: 1418 cells/uL (ref 850–3900)
MCH: 25.3 pg — ABNORMAL LOW (ref 27.0–33.0)
MCHC: 32.7 g/dL (ref 32.0–36.0)
MCV: 77.6 fL — ABNORMAL LOW (ref 80.0–100.0)
MPV: 9.5 fL (ref 7.5–12.5)
Monocytes Relative: 9.3 %
Neutro Abs: 7833 cells/uL — ABNORMAL HIGH (ref 1500–7800)
Neutrophils Relative %: 74.6 %
Platelets: 446 10*3/uL — ABNORMAL HIGH (ref 140–400)
RBC: 4.46 10*6/uL (ref 4.20–5.80)
RDW: 13.7 % (ref 11.0–15.0)
Total Lymphocyte: 13.5 %
WBC: 10.5 10*3/uL (ref 3.8–10.8)

## 2020-04-30 NOTE — Progress Notes (Signed)
Phone [phone] In person visit   Subjective:   Larry Garner is a 64 y.o. year old very pleasant male patient who presents for/with See problem oriented charting Chief Complaint  Patient presents with  . Lab follow up   This visit occurred during the SARS-CoV-2 public health emergency.  Safety protocols were in place, including screening questions prior to the visit, additional usage of staff PPE, and extensive cleaning of exam room while observing appropriate contact time as indicated for disinfecting solutions.   Past Medical History-  Patient Active Problem List   Diagnosis Date Noted  . Well controlled type 2 diabetes mellitus ([place]) 03/31/2010    Priority: High  . Coronary artery disease involving native coronary artery of native heart without angina pectoris 04/06/2007    Priority: High  . OSA (obstructive sleep apnea) 01/09/2018    Priority: Medium  . Hamartoma ([place]) 03/15/2017    Priority: Medium  . Insomnia 05/27/2014    Priority: Medium  . Asthma, moderate persistent 06/19/2008    Priority: Medium  . ERECTILE DYSFUNCTION, SECONDARY TO MEDICATION 05/17/2007    Priority: Medium  . Hyperlipidemia 04/06/2007    Priority: Medium  . Anxiety state 04/06/2007    Priority: Medium  . Essential hypertension 04/06/2007    Priority: Medium  . Former smoker 11/04/2014    Priority: Low  . Plantar wart of left foot 11/27/2010    Priority: Low  . PSA, INCREASED 09/15/2009    Priority: Low  . Diverticulitis of colon 05/08/2009    Priority: Low  . ANKLE PAIN, CHRONIC 06/05/2007    Priority: Low  . Allergic rhinitis 05/17/2007    Priority: Low    Medications- reviewed and updated Current Outpatient Medications  Medication Sig Dispense Refill  . albuterol (PROVENTIL HFA;VENTOLIN HFA) 108 (90 Base) MCG/ACT inhaler Inhale 2 puffs into the lungs every 6 (six) hours as needed for wheezing or shortness of breath. 1 Inhaler 5  . ALPRAZolam (XANAX) 0.5 MG tablet Take 1 tablet  (0.5 mg total) by mouth every 6 (six) hours as needed. 30 tablet 3  . amLODipine (NORVASC) 5 MG tablet TAKE 1 TABLET BY MOUTH EVERY DAY 90 tablet 1  . aspirin EC 81 MG tablet Take 1 tablet (81 mg total) by mouth daily. 90 tablet 3  . Azelastine-Fluticasone 137-50 MCG/ACT SUSP Place 1 spray into the nose as needed.    . bisoprolol-hydrochlorothiazide (ZIAC) 10-6.25 MG tablet TAKE 1 TABLET BY MOUTH EVERY DAY 90 tablet 1  . famciclovir (FAMVIR) 500 MG tablet TAKE 3 TABLETS BY MOUTH EVERY DAY AS NEEDED AT FIRST SIGN OF OUTBREAK OF FEVER BLISTER 15 tablet 1  . fluticasone-salmeterol (ADVAIR HFA) 45-21 MCG/ACT inhaler Inhale 2 puffs into the lungs 2 (two) times daily. (Patient taking differently: Inhale 2 puffs into the lungs as needed. ) 1 Inhaler 2  . folic acid (FOLVITE) 295 MCG tablet Take 1,600 mcg by mouth daily.      Marland Kitchen glimepiride (AMARYL) 2 MG tablet TAKE 1 TABLET (2 MG TOTAL) BY MOUTH DAILY BEFORE BREAKFAST. 90 tablet 3  . glucose blood (BAYER CONTOUR NEXT TEST) test strip Use to test blood sugars daily. Dx: E11.9 100 each 12  . INVOKAMET [phone] MG TABS TAKE 1 TABLET TWICE A DAY 180 tablet 3  . losartan (COZAAR) 100 MG tablet TAKE 1 TABLET BY MOUTH EVERY DAY 90 tablet 3  . Multiple Vitamin (MULTIVITAMIN) tablet Take 1 tablet by mouth daily.      . nitroGLYCERIN (NITROSTAT) 0.4  MG SL tablet PLACE 1 TABLET UNDER THE TONGUE EVERY 5 MINUTES AS NEEDED FOR CHEST PAIN. 25 tablet 1  . omega-3 acid ethyl esters (LOVAZA) 1 g capsule Take 1 capsule (1 g total) by mouth 3 (three) times daily. 270 capsule 3  . oxymetazoline (AFRIN) 0.05 % nasal spray Place 1 spray into both nostrils 2 (two) times daily as needed for congestion.    . rosuvastatin (CRESTOR) 20 MG tablet TAKE 1 TABLET BY MOUTH DAILY 90 tablet 4  . Semaglutide, 1 MG/DOSE, (OZEMPIC, 1 MG/DOSE,) 2 MG/1.5ML SOPN Inject 0.75 mLs (1 mg total) into the skin once a week. 3 mL 5  . zaleplon (SONATA) 10 MG capsule TAKE 1 CAPSULE (10 MG TOTAL) BY MOUTH  AT BEDTIME AS NEEDED. FOR SLEEP 30 capsule 5   No current facility-administered medications for this visit.     Objective:  BP 124/80   Pulse 93   Temp 98.5 F (36.9 C) (Temporal)   Resp 18   Ht 5\' 11"  (1.803 m)   Wt 186 lb 9.6 oz (84.6 kg)   SpO2 95%   BMI 26.03 kg/m  Gen: NAD, resting comfortably, appears thin/fatigued CV: RRR no murmurs rubs or gallops Lungs: CTAB no crackles, wheeze, rhonchi Ext: no edema Skin: warm, dry     Assessment and Plan  #Weight loss/anemia/leukocytosis/fatigue S:Patient wanted to make sure that his weight loss is coming from the recent changes in his medications and not something else.  Patient is down 16 pounds in the last 2 months  No blood in stool or dark black stool. HGb has dropped to 10.8 from 12.4 from 14.2 in July. Intermittent temp variations up to 100.3 every 3-4 days for 2 months. He is having night sweats 3 nights a week. Very tired.   Has done 2 rapid home tests in 2 weeks ago and 4 week ago period.   Some nausea but improved from prior- trigger can be certain foods.   Saw urology x1 after renal bleed- pet scan planned January and also to cover for lung concern .   From prior ct renal stone study 02/21/20 "Subcapsular hematoma involving the left kidney measuring up to 3.1 cm. Renal hemorrhage causes mass effect on and compression of the renal parenchyma, ([place] kidney). Source of hemorrhage is likely a small left renal lesion demonstrated on prior contrast-enhanced exam that is not well seen currently. 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 This nodule was evaluated by CT 06/30/2015 and found to be non hypermetabolic. Given continued progression, indolent neoplasm remains a consideration."  Wt Readings  from Last 3 Encounters:  04/30/20 186 lb 9.6 oz (84.6 kg)  03/05/20 202 lb 6.4 oz (91.8 kg)  02/21/20 202 lb 11.2 oz (91.9 kg)    A/P: 64 year old male with intermittent fevers, night sweats, weight loss which could be related to [place] for diabetes -we are stopping this for now, leukocytosis, anemia -Has had negative rapid Covid test at home but we will do PCR test today-I tested in the room with n95/appropriate equipment and had staff come into draw labs (deferred urine for now).  -I was honest with patient my concern primarily was ruling out leukemia or lymphoma.  Blood work as below -Strongly considering PET scan given prior lung  lesion and recent renal issues as above.  Will hold off on chest x-ray for now which I would typically do with unintentional weight loss work-up -Holding Ozempic for now-could certainly contribute to weight loss and fatigue but I do not think this would explain night sweats most likely  # Diabetes S: Medication:Invokamet [phone] mg twice daily, Januvia 100 mg recently changed to [place] which we are planning to hold after today, glimepiride 2 mg Lab Results  Component Value Date   HGBA1C 8.8 (H) 02/21/2020   HGBA1C 8.3 (A) 10/23/2019   HGBA1C 9.0 (A) 07/09/2019   A/P: Suspect A1c is improving with 16 pound weight loss but I do want to hold Ozempic until further work-up as planned for unintentional weight loss as above/below   Recommended follow up: Depends on work-up as below Future Appointments  Date Time Provider Trail  05/06/2020  8:30 AM Josue Hector, MD [id number]    Lab/Order associations:   ICD-10-CM   1. Unintentional weight loss  R63.4 CBC With Differential/Platelet    C-reactive protein    Pathologist smear review    COMPLETE METABOLIC PANEL WITH GFR    TSH    Fecal occult blood, imunochemical    POCT Urinalysis Dipstick (Automated)    Sedimentation rate    CBC With Differential/Platelet    Sedimentation rate     C-reactive protein    Pathologist smear review    COMPLETE METABOLIC PANEL WITH GFR    TSH    CANCELED: Sedimentation rate  2. Leukocytosis, unspecified type  D72.829 CBC With Differential/Platelet    Pathologist smear review    CBC With Differential/Platelet    Pathologist smear review  3. Anemia, unspecified type  D64.9 Fecal occult blood, imunochemical  4. Fever, unspecified fever cause  R50.9 Novel Coronavirus, NAA ([facility])    Novel Coronavirus, NAA ([facility])    Time Spent: 30 minutes of total time (11:23 AM-11:48 PM, 1:42- 1:47 PM) was spent on the date of the encounter performing the following actions: chart review prior to seeing the patient, obtaining history, performing a medically necessary exam, counseling on the treatment plan, placing orders, and documenting in our EHR.   Return precautions advised.  [name], MD

## 2020-04-30 NOTE — Patient Instructions (Addendum)
Health Maintenance Due  Topic Date Due  . INFLUENZA VACCINE Declined in office flu shot. Received his covid booster yesterday. Would wait at least a month 03/09/2020   Please stop by lab before you go If you have mychart- we will send your results within 3 business days of [place] receiving them.  If you do not have mychart- we will call you about results within 5 business days of [place] receiving them.  *please note we are currently using [facility] which has a longer processing time than [place] typically so labs may not come back as quickly as in the past *please also note that you will see labs on mychart as soon as they post. I will later go in and write notes on them- will say "notes from Dr. Yong Channel"  Covid PCR test today  Just to see if any symptoms improve lets go back off ozempic for now.

## 2020-05-01 LAB — COMPLETE METABOLIC PANEL WITH GFR
AG Ratio: 0.9 (calc) — ABNORMAL LOW (ref 1.0–2.5)
ALT: 16 U/L (ref 9–46)
AST: 13 U/L (ref 10–35)
Albumin: 3.6 g/dL (ref 3.6–5.1)
Alkaline phosphatase (APISO): 94 U/L (ref 35–144)
BUN: 12 mg/dL (ref 7–25)
CO2: 29 mmol/L (ref 20–32)
Calcium: 9.7 mg/dL (ref 8.6–10.3)
Chloride: 100 mmol/L (ref 98–110)
Creat: 0.97 mg/dL (ref 0.70–1.25)
GFR, Est African American: 95 mL/min/{1.73_m2} (ref >=60)
GFR, Est Non African American: 82 mL/min/{1.73_m2} (ref >=60)
Globulin: 3.9 g/dL (calc) — ABNORMAL HIGH (ref 1.9–3.7)
Glucose, Bld: 111 mg/dL — ABNORMAL HIGH (ref 65–99)
Potassium: 3.5 mmol/L (ref 3.5–5.3)
Sodium: 138 mmol/L (ref 135–146)
Total Bilirubin: 0.4 mg/dL (ref 0.2–1.2)
Total Protein: 7.5 g/dL (ref 6.1–8.1)

## 2020-05-01 LAB — SEDIMENTATION RATE: Sed Rate: 122 mm/h — ABNORMAL HIGH (ref 0–20)

## 2020-05-01 LAB — C-REACTIVE PROTEIN: CRP: 176.3 mg/L — ABNORMAL HIGH (ref <8.0)

## 2020-05-01 LAB — TSH: TSH: 0.01 mIU/L — ABNORMAL LOW (ref 0.40–4.50)

## 2020-05-01 LAB — PATHOLOGIST SMEAR REVIEW

## 2020-05-02 LAB — SARS-COV-2, NAA 2 DAY TAT

## 2020-05-02 LAB — NOVEL CORONAVIRUS, NAA: SARS-CoV-2, NAA: NOT DETECTED

## 2020-05-02 NOTE — Addendum Note (Signed)
Addended by: Marin Olp on: 05/02/2020 08:42 AM   Modules accepted: Orders

## 2020-05-02 NOTE — Addendum Note (Signed)
Addended by: Marin Olp on: 05/02/2020 08:36 AM   Modules accepted: Orders

## 2020-05-02 NOTE — Addendum Note (Signed)
Addended by: Marin Olp on: 05/02/2020 03:22 PM   Modules accepted: Orders

## 2020-05-03 ENCOUNTER — Encounter: Payer: Self-pay | Admitting: Family Medicine

## 2020-05-05 ENCOUNTER — Other Ambulatory Visit: Payer: Self-pay

## 2020-05-05 ENCOUNTER — Other Ambulatory Visit: Payer: BC Managed Care – PPO

## 2020-05-05 DIAGNOSIS — D649 Anemia, unspecified: Secondary | ICD-10-CM

## 2020-05-05 DIAGNOSIS — R7989 Other specified abnormal findings of blood chemistry: Secondary | ICD-10-CM

## 2020-05-05 DIAGNOSIS — R634 Abnormal weight loss: Secondary | ICD-10-CM

## 2020-05-06 ENCOUNTER — Encounter: Payer: Self-pay | Admitting: Cardiovascular Disease

## 2020-05-06 ENCOUNTER — Ambulatory Visit (INDEPENDENT_AMBULATORY_CARE_PROVIDER_SITE_OTHER): Payer: BC Managed Care – PPO | Admitting: Cardiovascular Disease

## 2020-05-06 VITALS — BP 120/54 | HR 86 | Ht 71.0 in | Wt 181.4 lb

## 2020-05-06 DIAGNOSIS — I2510 Atherosclerotic heart disease of native coronary artery without angina pectoris: Secondary | ICD-10-CM

## 2020-05-06 LAB — IRON,TIBC AND FERRITIN PANEL
%SAT: 6 % (calc) — ABNORMAL LOW (ref 20–48)
Ferritin: 507 ng/mL — ABNORMAL HIGH (ref 24–380)
Iron: 12 ug/dL — ABNORMAL LOW (ref 50–180)
TIBC: 188 mcg/dL (calc) — ABNORMAL LOW (ref 250–425)

## 2020-05-06 LAB — T3, FREE: T3, Free: 2.9 pg/mL (ref 2.3–4.2)

## 2020-05-06 LAB — TSH: TSH: 0.03 mIU/L — ABNORMAL LOW (ref 0.40–4.50)

## 2020-05-06 LAB — T4, FREE: Free T4: 1.2 ng/dL (ref 0.8–1.8)

## 2020-05-06 NOTE — Patient Instructions (Signed)

## 2020-05-07 ENCOUNTER — Encounter: Payer: Self-pay | Admitting: Family Medicine

## 2020-05-07 DIAGNOSIS — R9389 Abnormal findings on diagnostic imaging of other specified body structures: Secondary | ICD-10-CM

## 2020-05-07 DIAGNOSIS — R634 Abnormal weight loss: Secondary | ICD-10-CM

## 2020-05-08 ENCOUNTER — Other Ambulatory Visit (INDEPENDENT_AMBULATORY_CARE_PROVIDER_SITE_OTHER): Payer: BC Managed Care – PPO

## 2020-05-08 ENCOUNTER — Other Ambulatory Visit: Payer: Self-pay | Admitting: Family Medicine

## 2020-05-08 DIAGNOSIS — R634 Abnormal weight loss: Secondary | ICD-10-CM

## 2020-05-08 DIAGNOSIS — D649 Anemia, unspecified: Secondary | ICD-10-CM

## 2020-05-08 DIAGNOSIS — R195 Other fecal abnormalities: Secondary | ICD-10-CM

## 2020-05-08 LAB — FECAL OCCULT BLOOD, IMMUNOCHEMICAL: Fecal Occult Bld: POSITIVE — AB

## 2020-05-09 ENCOUNTER — Encounter: Payer: Self-pay | Admitting: Family Medicine

## 2020-05-09 DIAGNOSIS — H3589 Other specified retinal disorders: Secondary | ICD-10-CM | POA: Diagnosis not present

## 2020-05-09 DIAGNOSIS — H5203 Hypermetropia, bilateral: Secondary | ICD-10-CM | POA: Diagnosis not present

## 2020-05-09 DIAGNOSIS — E119 Type 2 diabetes mellitus without complications: Secondary | ICD-10-CM | POA: Diagnosis not present

## 2020-05-09 LAB — HM DIABETES EYE EXAM

## 2020-05-20 ENCOUNTER — Ambulatory Visit (HOSPITAL_COMMUNITY)
Admission: RE | Admit: 2020-05-20 | Discharge: 2020-05-20 | Disposition: A | Discharge status: Home / Self Care | Payer: BC Managed Care – PPO | Source: Ambulatory Visit | Attending: Family Medicine | Admitting: Family Medicine

## 2020-05-20 ENCOUNTER — Other Ambulatory Visit: Payer: Self-pay

## 2020-05-20 DIAGNOSIS — D72829 Elevated white blood cell count, unspecified: Secondary | ICD-10-CM | POA: Diagnosis not present

## 2020-05-20 DIAGNOSIS — R634 Abnormal weight loss: Secondary | ICD-10-CM | POA: Diagnosis not present

## 2020-05-20 DIAGNOSIS — R700 Elevated erythrocyte sedimentation rate: Secondary | ICD-10-CM | POA: Diagnosis not present

## 2020-05-20 LAB — GLUCOSE, CAPILLARY: Glucose-Capillary: 116 mg/dL — ABNORMAL HIGH (ref 70–99)

## 2020-05-20 MED ORDER — FLUDEOXYGLUCOSE F - 18 (FDG) INJECTION
8.6000 | Freq: Once | INTRAVENOUS | Status: AC | PRN
  Administered 2020-05-20: 8.6 via INTRAVENOUS

## 2020-05-21 ENCOUNTER — Encounter: Payer: Self-pay | Admitting: Pulmonary Disease

## 2020-05-21 ENCOUNTER — Other Ambulatory Visit: Payer: Self-pay

## 2020-05-21 ENCOUNTER — Ambulatory Visit (INDEPENDENT_AMBULATORY_CARE_PROVIDER_SITE_OTHER): Payer: BC Managed Care – PPO | Admitting: Pulmonary Disease

## 2020-05-21 VITALS — BP 122/74 | HR 96 | Temp 98.0°F | Ht 71.0 in | Wt 180.0 lb

## 2020-05-21 DIAGNOSIS — R911 Solitary pulmonary nodule: Secondary | ICD-10-CM | POA: Diagnosis not present

## 2020-05-21 DIAGNOSIS — G4733 Obstructive sleep apnea (adult) (pediatric): Secondary | ICD-10-CM

## 2020-05-21 DIAGNOSIS — J4540 Moderate persistent asthma, uncomplicated: Secondary | ICD-10-CM | POA: Diagnosis not present

## 2020-05-21 NOTE — Progress Notes (Signed)
   Subjective:    Patient ID: Larry Garner, male    DOB: 04-02-1956, 64 y.o.   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  In the interim he developed fever, night sweats, hematuria, was found to have left renal subcapsular hematoma on CT abdomen 02/2020 He underwent PET scan which clarified this to be a left renal mass , hypermetabolic suggestive of left renal vein invasion Left lung nodule is remains mildly hypermetabolic with SUV 2.6, other lung nodules were stable. He has lost 40 pounds from 220 to his current weight of 180.  He has [facility]  He has stopped using his CPAP machine.  Asthma is well controlled on Advair, seldom uses albuterol His vaccinations are up-to-date  Family history of carcinoid in mother  Significant tests/ events reviewed  HST 01/2018 AHI 11/h, worse in supine  CT chest w contrast 01/2018 2.2 cm LLL nodule, stable compared to 11/2016   06/30/2015 PET- Non hypermetabolic 1.9 cm left lower lobe pulmonary nodule, suggesting a benign etiology. Two right lower lobe subcentimeter pulmonary nodules without hypermetabolism, although these are below PET resolution   02/13/2020 CT chest wo contrast- Central LLL pulmonary lesion 2.7 x 2.7 cm (increased from 2.3 x 2.3 cm previously).  Lesion remains relatively homogeneous and well defined.  Additionally he has several other small pulmonary nodules which remain stable in size largest measuring 5 mm right lower lobe.  Review of Systems neg for any significant sore throat, dysphagia, itching, sneezing, nasal congestion or  excess/ purulent secretions, fever, chills, sweats, unintended wt loss, pleuritic or exertional cp, hempoptysis, orthopnea pnd or change in chronic leg swelling. Also denies presyncope, palpitations, heartburn, abdominal pain, nausea, vomiting, diarrhea or change in bowel or urinary habits, dysuria,hematuria, rash, arthralgias, visual complaints, headache, numbness weakness or ataxia.     Objective:   Physical Exam  Gen. Pleasant, thin, in no distress ENT - no thrush, no pallor/icterus,no post nasal drip Neck: No JVD, no thyromegaly, no carotid bruits Lungs: no use of accessory muscles, no dullness to percussion, clear without rales or rhonchi  Cardiovascular: Rhythm regular, heart sounds  normal, no murmurs or gallops, no peripheral edema Musculoskeletal: No deformities, no cyanosis or clubbing        Assessment & Plan:

## 2020-05-21 NOTE — Assessment & Plan Note (Signed)
Well-controlled, continue Symbicort for now. No contraindication to abdominal surgery that he will require for renal mass, likely nephrectomy.  Urologist consultation is scheduled at [facility] and at this point

## 2020-05-21 NOTE — Patient Instructions (Signed)
  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 We will schedule cardiothoracic consultation If necessary, bronchoscopy can be performed

## 2020-05-21 NOTE — Progress Notes (Signed)
Noted that patient has appointment scheduled with Dr Elsworth Soho today 05/21/2020 at 3:30pm

## 2020-05-21 NOTE — Assessment & Plan Note (Signed)
Due to his weight loss, OSA is likely resolved and okay to discontinue CPAP for now. We will reassess in the future

## 2020-05-21 NOTE — Assessment & Plan Note (Signed)
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 Incidentally his mother also had carcinoid.  I wonder if his history of asthma is also related to endobronchial obstruction.  This would certainly be amenable to bronchoscopy but regardless would require resection so we will proceed with a referral to cardiothoracic surgery. I have explained to him that his renal mass takes precedence over this work-up and it is okay if resection of lung nodule is deferred for a few months until he recovers from kidney surgery

## 2020-05-21 NOTE — Progress Notes (Signed)
Called and left message on voicemail to please return call to set up office visit with Dr Elsworth Soho. Contact number provided.

## 2020-05-22 ENCOUNTER — Other Ambulatory Visit: Payer: Self-pay

## 2020-05-22 MED ORDER — BISOPROLOL-HYDROCHLOROTHIAZIDE 10-6.25 MG PO TABS: 1.0000 | ORAL_TABLET | Freq: Every day | ORAL | 2 refills | Status: DC

## 2020-05-27 ENCOUNTER — Encounter: Payer: Self-pay | Admitting: Thoracic Surgery (Cardiothoracic Vascular Surgery)

## 2020-05-27 ENCOUNTER — Institutional Professional Consult (permissible substitution) (INDEPENDENT_AMBULATORY_CARE_PROVIDER_SITE_OTHER): Payer: BC Managed Care – PPO | Admitting: Thoracic Surgery (Cardiothoracic Vascular Surgery)

## 2020-05-27 ENCOUNTER — Other Ambulatory Visit: Payer: Self-pay

## 2020-05-27 VITALS — BP 125/72 | HR 77 | Temp 98.2°F | Resp 20 | Ht 71.0 in | Wt 183.2 lb

## 2020-05-27 DIAGNOSIS — R911 Solitary pulmonary nodule: Secondary | ICD-10-CM | POA: Diagnosis not present

## 2020-05-27 NOTE — Progress Notes (Signed)
PCP is Marin Olp, MD Referring Provider is Rigoberto Noel, MD  Chief Complaint  Patient presents with  . Consult    Left lower lobe pulmonary nodule, CT 02/13/20, PET 05/20/20    HPI: Larry Garner is sent for consultation regarding a left lower lobe lung nodule.  Larry Garner is a 64 year old man with a history of hypertension, hyperlipidemia, coronary disease, MI, coronary bypass grafting in 2000, asthma, diabetes, anxiety, and sleep apnea.  Larry Garner has had a known left lower lobe lung nodule for quite some time.  Larry Garner presented in September with complaint of a 16 pound weight loss in 2 months.  Larry Garner was found to be anemic.  Larry Garner was complaining of intermittent low-grade fevers in the afternoons and also night sweats.  Larry Garner also complained of fatigue.  Larry Garner has been evaluated by urology for some hematuria.  A CT in July said that Larry Garner had renal hemorrhage with a subcapsular hematoma.  Larry Garner also was noted to have had an increase in size of a left lower lobe lung nodule from 2.3 to 2.7 cm.  Larry Garner underwent a PET/CT which showed the lung nodule had modest uptake with an SUV of 2.6.  However there was a 6.4 x 4 cm mass in the left kidney with an SUV of 18.4 along with possible invasion into the left renal vein.  Larry Garner saw urologist in some type of resection was recommended.  Larry Garner is seeing a urologist at [facility] on 06/06/2019.  Larry Garner does have a cough with.  Has been around for a couple of years.  Larry Garner has had asthma as an adult.  Larry Garner is a lifelong non-smoker.   Past Medical History:  Diagnosis Date  . Allergic rhinitis   . Allergy   . Anxiety   . Asthma   . Diabetes ([place])   . Diverticulitis   . GERD (gastroesophageal reflux disease)    very occ  . History of heart attack   . HLD (hyperlipidemia)   . HTN (hypertension)   . Myocardial infarction ([place]) 2000  . Sleep apnea    wears cpap   . Tubular adenoma of colon 09/2008    Past Surgical History:  Procedure Laterality Date  . CARDIAC CATHETERIZATION N/A  07/20/2016   Procedure: Left Heart Cath and Cors/Grafts Angiography;  Surgeon: Belva Crome, MD;  Location: [facility];  Service: Cardiovascular;  Laterality: N/A;  . CATARACT EXTRACTION Bilateral 06/06/2019   Dr. Tommy Rainwater. oct left, dec right 2020   . COLONOSCOPY    . CORONARY ANGIOPLASTY    . CORONARY ARTERY BYPASS GRAFT  06/1999  . POLYPECTOMY    . TONSILLECTOMY     late50's early 47's    Family History  Problem Relation Age of Onset  . Hypertension Mother   . Alzheimer's disease Mother   . Colon polyps Mother   . Hyperlipidemia Father   . Hypertension Father   . Colon polyps Father   . Colon cancer Paternal Aunt   . Pancreatic cancer Neg Hx   . Stomach cancer Neg Hx   . Thyroid disease Neg Hx   . Esophageal cancer Neg Hx   . Rectal cancer Neg Hx     Social History Social History   Tobacco Use  . Smoking status: Former Smoker    Packs/day: 0.50    Years: 4.00    Pack years: 2.00    Types: Cigarettes    Quit date: 09/29/1977    Years since quitting: 42.6  .  Smokeless tobacco: Never Used  . Tobacco comment: quit on 1980  Vaping Use  . Vaping Use: Never used  Substance Use Topics  . Alcohol use: Yes    Alcohol/week: 1.0 standard drink    Types: 1 Standard drinks or equivalent per week  . Drug use: No    Current Outpatient Medications  Medication Sig Dispense Refill  . albuterol (PROVENTIL HFA;VENTOLIN HFA) 108 (90 Base) MCG/ACT inhaler Inhale 2 puffs into the lungs every 6 (six) hours as needed for wheezing or shortness of breath. 1 Inhaler 5  . ALPRAZolam (XANAX) 0.5 MG tablet Take 1 tablet (0.5 mg total) by mouth every 6 (six) hours as needed. 30 tablet 3  . amLODipine (NORVASC) 5 MG tablet TAKE 1 TABLET BY MOUTH EVERY DAY 90 tablet 1  . aspirin EC 81 MG tablet Take 1 tablet (81 mg total) by mouth daily. 90 tablet 3  . Azelastine-Fluticasone 137-50 MCG/ACT SUSP Place 1 spray into the nose as needed.    . bisoprolol-hydrochlorothiazide (ZIAC) 10-6.25 MG  tablet Take 1 tablet by mouth daily. 90 tablet 2  . famciclovir (FAMVIR) 500 MG tablet TAKE 3 TABLETS BY MOUTH EVERY DAY AS NEEDED AT FIRST SIGN OF OUTBREAK OF FEVER BLISTER 15 tablet 1  . fluticasone-salmeterol (ADVAIR HFA) 45-21 MCG/ACT inhaler Inhale 2 puffs into the lungs 2 (two) times daily. (Patient taking differently: Inhale 2 puffs into the lungs as needed. ) 1 Inhaler 2  . folic acid (FOLVITE) 683 MCG tablet Take 1,600 mcg by mouth daily.      Marland Kitchen glimepiride (AMARYL) 2 MG tablet TAKE 1 TABLET (2 MG TOTAL) BY MOUTH DAILY BEFORE BREAKFAST. 90 tablet 3  . glucose blood (BAYER CONTOUR NEXT TEST) test strip Use to test blood sugars daily. Dx: E11.9 100 each 12  . INVOKAMET [phone] MG TABS TAKE 1 TABLET TWICE A DAY 180 tablet 3  . losartan (COZAAR) 100 MG tablet TAKE 1 TABLET BY MOUTH EVERY DAY 90 tablet 3  . Multiple Vitamin (MULTIVITAMIN) tablet Take 1 tablet by mouth daily.      . nitroGLYCERIN (NITROSTAT) 0.4 MG SL tablet PLACE 1 TABLET UNDER THE TONGUE EVERY 5 MINUTES AS NEEDED FOR CHEST PAIN. 25 tablet 1  . omega-3 acid ethyl esters (LOVAZA) 1 g capsule Take 1 capsule (1 g total) by mouth 3 (three) times daily. 270 capsule 3  . oxymetazoline (AFRIN) 0.05 % nasal spray Place 1 spray into both nostrils 2 (two) times daily as needed for congestion.    . rosuvastatin (CRESTOR) 20 MG tablet TAKE 1 TABLET BY MOUTH DAILY 90 tablet 4  . Semaglutide, 1 MG/DOSE, (OZEMPIC, 1 MG/DOSE,) 2 MG/1.5ML SOPN Inject 0.75 mLs (1 mg total) into the skin once a week. 3 mL 5  . zaleplon (SONATA) 10 MG capsule TAKE 1 CAPSULE (10 MG TOTAL) BY MOUTH AT BEDTIME AS NEEDED. FOR SLEEP 30 capsule 5   No current facility-administered medications for this visit.    Allergies  Allergen Reactions  . Morphine Sulfate Nausea And Vomiting and Swelling    Review of Systems  Constitutional: Positive for activity change, chills, diaphoresis, fatigue, fever and unexpected weight change (16 pounds in 2 months).  HENT:  Negative for trouble swallowing and voice change.   Respiratory: Positive for cough and wheezing. Negative for shortness of breath.   Cardiovascular: Negative for chest pain and leg swelling.  Gastrointestinal: Positive for diarrhea. Negative for abdominal pain.  Genitourinary: Positive for hematuria. Negative for flank pain.  Hematological: Negative  for adenopathy. Does not bruise/bleed easily.  Psychiatric/Behavioral: The patient is nervous/anxious.   All other systems reviewed and are negative.   BP 125/72 (BP Location: Right Arm, Patient Position: Sitting)   Pulse 77   Temp 98.2 F (36.8 C)   Resp 20   Ht 5\' 11"  (1.803 m)   Wt 183 lb 3.2 oz (83.1 kg)   SpO2 99% Comment: RA with mask on  BMI 25.55 kg/m  Physical Exam Vitals reviewed.  Constitutional:      General: Larry Garner is not in acute distress.    Appearance: Normal appearance.  HENT:     Head: Normocephalic and atraumatic.  Eyes:     General: No scleral icterus.    Extraocular Movements: Extraocular movements intact.  Neck:     Vascular: No carotid bruit.  Cardiovascular:     Rate and Rhythm: Normal rate and regular rhythm.     Heart sounds: Normal heart sounds. No murmur heard.  No friction rub. No gallop.   Pulmonary:     Effort: Pulmonary effort is normal. No respiratory distress.     Breath sounds: Normal breath sounds. No wheezing or rales.  Abdominal:     General: There is no distension.     Palpations: Abdomen is soft.     Tenderness: There is no abdominal tenderness.  Musculoskeletal:     Cervical back: Neck supple.  Lymphadenopathy:     Cervical: No cervical adenopathy.  Skin:    General: Skin is warm and dry.  Neurological:     General: No focal deficit present.     Mental Status: Larry Garner is alert and oriented to person, place, and time.     Cranial Nerves: No cranial nerve deficit.     Motor: No weakness.     Gait: Gait normal.      Diagnostic Tests: NUCLEAR MEDICINE PET SKULL BASE TO  THIGH  TECHNIQUE: 8.6 mCi F-18 FDG was injected intravenously. Full-ring PET imaging was performed from the skull base to thigh after the radiotracer. CT data was obtained and used for attenuation correction and anatomic localization.  Fasting blood glucose: 116 mg/dl  COMPARISON:  CT stone study 02/21/2020. Chest CT 02/13/2020. PET-CT 06/30/2015.  FINDINGS: Mediastinal blood pool activity: SUV max 2.5  Liver activity: SUV max NA  NECK: No hypermetabolic lymph nodes in the neck.  Incidental CT findings: none  CHEST: No hypermetabolic mediastinal or hilar nodes. Low level FDG accumulation in a normal left axillary node is probably reactive.  2.9 x 2.6 cm well-defined homogeneous retro hilar nodule in the left lower lobe has been slowly progressive since 2016 when it measured 1.9 x 1.8 cm cm. This lesion was evaluated by PET-CT on 06/30/2015 and found to have SUV max 2.6. Uptake in this nodule today is measured at SUV max = 2.6.  Several additional scattered tiny right lung nodules are stable since 2016 including index 5 mm nodule posterior right lower lobe on 52/8.  Incidental CT findings: No pericardial effusion. Coronary artery calcification is evident. Status post CABG.  ABDOMEN/PELVIS: Hypermetabolic lesion in the anterior left kidney measures approximately 6.4 x 4.0 cm. This lesion was largely obscured by the subcapsular hemorrhage on study from 02/21/2020. SUV max = 18.4 today. Left renal vein is expanded with high attenuation material similar to the mass lesion and is hypermetabolic suggesting left renal vein invasion.  Exophytic lesions from the interpolar right kidney measure up to 16 mm in show no hypermetabolism, likely cysts.  No hypermetabolic lymphadenopathy  in the abdomen.  Diffuse FDG accumulation in the colon is probably physiologic.  Incidental CT findings: 2.5 cm calcified gallstone noted. Diverticular changes noted left colon  without diverticulitis. Small left groin hernia contains only fat.  SKELETON: No focal hypermetabolic activity to suggest skeletal metastasis.  Incidental CT findings: No worrisome lytic or sclerotic osseous abnormality.  IMPRESSION: 1. Interval resolution of large left renal subcapsular hematoma seen on CT stone study of 02/21/2020 with hypermetabolic left upper pole left renal mass now evident, consistent with renal cell carcinoma. Hypermetabolic material in the left renal vein is consistent with left renal vein invasion. MRI of the abdomen with and without contrast recommended to further evaluate. 2. No hypermetabolic lymphadenopathy in the abdomen on today's study. No evidence for hypermetabolic metastases in the neck, chest, or pelvis. 3. 2.9 cm well-defined homogeneous retro hilar nodule in the left lower lobe has been slowly progressive since 2016. This shows low level FDG uptake today and likely represents benign or low-grade neoplasm. 4. Cholelithiasis. 5. Diverticulosis without diverticulitis. 6.  Aortic Atherosclerois (ICD10-170.0)  These results will be called to the ordering clinician or representative by the Radiologist Assistant, and communication documented in the PACS or [employer].   Electronically Signed   By: Misty Stanley M.D.   On: 05/20/2020 09:43  I personally reviewed the CT images and concur with the findings as noted above  Impression: Larry Garner is a 64 year old man with a history of hypertension, hyperlipidemia, coronary disease, MI, coronary bypass grafting in 2000, asthma, diabetes, anxiety, and sleep apnea.  Larry Garner also has had a left lower lobe lung nodule that has been followed for some time.  Recently presented with fatigue, malaise, weight loss, fevers and chills.  A PET/CT showed a hypermetabolic 6 cm left renal mass consistent with a renal cell carcinoma.  There was mild uptake in the left lower lobe lung nodule.  Renal  mass-renal cell carcinoma.  Obviously takes precedence in this situation.  Larry Garner is seeing a urologist at [facility] at the end of the month.  Once that is addressed we can determine timing to deal with the lung nodule.  Left lower lobe lung nodule-well-circumscribed 2.7 cm mass centrally in the left lower lobe not causing any central bronchial obstruction.  Low level hypermetabolism on PET/CT.  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  Other benign tumors are also in the differential but are far less likely.  The treatment would be resection.  However, there is no urgency to that and his renal issues need to be addressed first.  I'll plan to see him back sometime after the first of the year and we can see at that point if Larry Garner is ready to move forward with regards to the lung nodule or if Larry Garner will need more time.  Plan: Return in 3 months with a PA and lateral chest x-ray after renal issues addressed  I spent 30 minutes in review of records, review of images, and consultation with Larry Garner today Melrose Nakayama, MD [facility] [phone]

## 2020-05-28 ENCOUNTER — Encounter: Payer: Self-pay | Admitting: Family Medicine

## 2020-05-28 ENCOUNTER — Ambulatory Visit (INDEPENDENT_AMBULATORY_CARE_PROVIDER_SITE_OTHER): Payer: BC Managed Care – PPO

## 2020-05-28 DIAGNOSIS — Z23 Encounter for immunization: Secondary | ICD-10-CM | POA: Diagnosis not present

## 2020-06-04 ENCOUNTER — Encounter: Payer: Self-pay | Admitting: Family Medicine

## 2020-06-04 ENCOUNTER — Ambulatory Visit: Payer: BC Managed Care – PPO | Admitting: Family Medicine

## 2020-06-05 DIAGNOSIS — D4102 Neoplasm of uncertain behavior of left kidney: Secondary | ICD-10-CM | POA: Diagnosis not present

## 2020-06-09 HISTORY — PX: RENAL BIOPSY: SHX156

## 2020-06-11 ENCOUNTER — Other Ambulatory Visit: Payer: Self-pay

## 2020-06-11 MED ORDER — INVOKAMET 150-1000 MG PO TABS: 1.0000 | ORAL_TABLET | Freq: Two times a day (BID) | ORAL | 3 refills | Status: DC

## 2020-06-14 ENCOUNTER — Other Ambulatory Visit: Payer: Self-pay | Admitting: Family Medicine

## 2020-06-16 ENCOUNTER — Other Ambulatory Visit: Payer: Self-pay

## 2020-06-17 MED ORDER — ALBUTEROL SULFATE HFA 108 (90 BASE) MCG/ACT IN AERS: 2.0000 | INHALATION_SPRAY | Freq: Four times a day (QID) | RESPIRATORY_TRACT | 11 refills | Status: DC | PRN

## 2020-06-17 MED ORDER — BUDESONIDE-FORMOTEROL FUMARATE 160-4.5 MCG/ACT IN AERO: 2.0000 | INHALATION_SPRAY | Freq: Two times a day (BID) | RESPIRATORY_TRACT | 12 refills | Status: DC

## 2020-06-19 DIAGNOSIS — D4102 Neoplasm of uncertain behavior of left kidney: Secondary | ICD-10-CM | POA: Diagnosis not present

## 2020-06-19 DIAGNOSIS — N2889 Other specified disorders of kidney and ureter: Secondary | ICD-10-CM | POA: Diagnosis not present

## 2020-06-19 DIAGNOSIS — D500 Iron deficiency anemia secondary to blood loss (chronic): Secondary | ICD-10-CM | POA: Diagnosis present

## 2020-06-19 DIAGNOSIS — I2510 Atherosclerotic heart disease of native coronary artery without angina pectoris: Secondary | ICD-10-CM | POA: Diagnosis not present

## 2020-06-19 DIAGNOSIS — Z951 Presence of aortocoronary bypass graft: Secondary | ICD-10-CM | POA: Diagnosis not present

## 2020-06-19 DIAGNOSIS — Z87891 Personal history of nicotine dependence: Secondary | ICD-10-CM | POA: Diagnosis not present

## 2020-06-19 DIAGNOSIS — R918 Other nonspecific abnormal finding of lung field: Secondary | ICD-10-CM | POA: Diagnosis not present

## 2020-06-19 DIAGNOSIS — D696 Thrombocytopenia, unspecified: Secondary | ICD-10-CM | POA: Diagnosis not present

## 2020-06-20 DIAGNOSIS — D4102 Neoplasm of uncertain behavior of left kidney: Secondary | ICD-10-CM | POA: Diagnosis not present

## 2020-06-20 DIAGNOSIS — D500 Iron deficiency anemia secondary to blood loss (chronic): Secondary | ICD-10-CM | POA: Diagnosis not present

## 2020-06-20 MED ORDER — OZEMPIC (0.25 OR 0.5 MG/DOSE) 2 MG/1.5ML ~~LOC~~ SOPN: PEN_INJECTOR | SUBCUTANEOUS | 0 refills | Status: DC

## 2020-06-23 MED ORDER — ALPRAZOLAM 0.5 MG PO TABS
0.5000 mg | ORAL_TABLET | Freq: Four times a day (QID) | ORAL | 3 refills | Status: DC | PRN
Start: 2020-06-23 — End: 2021-03-15

## 2020-06-24 ENCOUNTER — Telehealth (INDEPENDENT_AMBULATORY_CARE_PROVIDER_SITE_OTHER): Payer: BC Managed Care – PPO | Admitting: Family Medicine

## 2020-06-24 ENCOUNTER — Other Ambulatory Visit: Payer: Self-pay

## 2020-06-24 DIAGNOSIS — R509 Fever, unspecified: Secondary | ICD-10-CM

## 2020-06-24 DIAGNOSIS — R0981 Nasal congestion: Secondary | ICD-10-CM | POA: Diagnosis not present

## 2020-06-24 DIAGNOSIS — R059 Cough, unspecified: Secondary | ICD-10-CM | POA: Diagnosis not present

## 2020-06-24 MED ORDER — DOXYCYCLINE HYCLATE 100 MG PO TABS: 100.0000 mg | ORAL_TABLET | Freq: Two times a day (BID) | ORAL | 0 refills | Status: DC

## 2020-06-24 NOTE — Patient Instructions (Addendum)
   It was nice to meet you today, and I really hope you are feeling better soon. I help [facility] out with telemedicine visits on Tuesdays and Thursdays and am available for visits on those days. If you have any concerns or questions following this visit please schedule a follow up visit with your Primary Care doctor or seek care at a local urgent care clinic to avoid delays in care.    Seek in person care promptly if your symptoms worsen, new concerns arise or you are not improving with treatment. Call 911 and/or seek emergency care if you symptoms are severe or life threatening.   -stay home while sick, and if you have [place] please stay home for a full 10 days since the onset of symptoms PLUS one day of no fever and feeling better  -[place] COVID19 testing information: [email or web address] [place] [id number] Covid testing is also available through most pharmacies. Let your primary care doctor and your specialist know if this is positive.  -I sent the medication(s) we discussed to your pharmacy: Meds ordered this encounter  Medications  . doxycycline (VIBRA-TABS) 100 MG tablet    Sig: Take 1 tablet (100 mg total) by mouth 2 (two) times daily.    Dispense:  14 tablet    Refill:  0   -can use nasal saline a few times per day if nasal congestion  -stay hydrated, drink plenty of fluids and eat small healthy meals - avoid dairy  -follow up with your doctor in 2-3 days unless improving and feeling better

## 2020-06-24 NOTE — Progress Notes (Signed)
Virtual Visit via Telephone Note  I connected with EDIS HUISH on 06/24/20 at  3:40 PM EST by telephone and verified that I am speaking with the correct person using two identifiers.   I discussed the limitations, risks, security and privacy concerns of performing an evaluation and management service by telephone and the availability of in person appointments. I also discussed with the patient that there may be a patient responsible charge related to this service. The patient expressed understanding and agreed to proceed.  Location patient: home, [place] Location provider: work or home office Participants present for the call: patient, provider Patient did not have a visit with me in the prior 7 days to address this/these issue(s).   History of Present Illness:  Acute telemedicine visit for Cough: -Onset: last 2-3 days -Symptoms include: bad cough - feels well otherwise, mild nasal congestion -reports low grade fevers at baseline for several weeks, some SOB at times, reports his docs are aware of this -want to get covid test and wonders if needs abx -Denies: mucus production, hemoptysis, body aches, malaise, wheezing, SOB, CP -has diarrhea at baseline with his current health issues -Has tried:robitussin seems to help some -Pertinent past medical history: undergoing work up for renal cancer at [facility] currently; asthma and and pulm nodule awaiting resection - has inhalers and  -Pertinent medication allergies: -COVID-19 vaccine status: has had triple doses of COVID19; has had flu shot   Observations/Objective: Patient sounds cheerful and well on the phone. I do not appreciate any SOB. Speech and thought processing are grossly intact. Patient reported vitals:  Assessment and Plan:  Cough  Fever, unspecified fever cause  Nasal congestion  -we discussed possible serious and likely etiologies, options for evaluation and workup, limitations of telemedicine visit vs in person visit,  treatment, treatment risks and precautions. Pt prefers to treat via telemedicine empirically rather than in person at this moment.  Query potential viral upper respiratory illness, postnasal drip or other as the cause of the acute cough.  He reports underlying low-grade fevers for weeks, reports has talked to his doctors about this, currently seeing many specialists.  Reports nobody has given him an antibiotic, now he wonders if he should do an antibiotic.  He also wanted to get Covid testing.  This is probably less likely given he is triple vaccinated.  However, discussed symptomatic care options for the cough, Covid testing options, treatment, potential complications, precautions.  His physicians immediately if the Covid test is positive.  Discussed risks and benefits of antibiotics and he wants to try an antibiotic in case this is a bacterial infection -think this may be less likely, however given his underlying frail state we opted to try doxycycline twice daily for 7 days.  He prefers to use over-the-counter options for the cough.  Advised that he notify his specialists and PCP as well and follow up promptly if any worsening or symptoms do not resolve with treatment.  Scheduled follow up with PCP offered: He agrees to call if needed  Advised to seek prompt in person care if worsening, new symptoms arise, or if is not improving with treatment. Advised of options for inperson care in case PCP office not available. Did let the patient know that I only do telemedicine shifts for [facility] on Tuesdays and Thursdays and advised a follow up visit with PCP or at an [facility] if has further questions or concerns.   Follow Up Instructions:  I did not refer this patient for an OV  with me in the next 24 hours for this/these issue(s).  I discussed the assessment and treatment plan with the patient. The patient was provided an opportunity to ask questions and all were answered. The patient agreed with the plan and  demonstrated an understanding of the instructions.   Over 20 minutes minutes on this encounter.   Lucretia Kern, DO

## 2020-06-27 DIAGNOSIS — D500 Iron deficiency anemia secondary to blood loss (chronic): Secondary | ICD-10-CM | POA: Diagnosis not present

## 2020-07-01 DIAGNOSIS — I10 Essential (primary) hypertension: Secondary | ICD-10-CM | POA: Diagnosis not present

## 2020-07-01 DIAGNOSIS — C642 Malignant neoplasm of left kidney, except renal pelvis: Secondary | ICD-10-CM | POA: Diagnosis not present

## 2020-07-01 DIAGNOSIS — I2510 Atherosclerotic heart disease of native coronary artery without angina pectoris: Secondary | ICD-10-CM | POA: Diagnosis not present

## 2020-07-01 DIAGNOSIS — E785 Hyperlipidemia, unspecified: Secondary | ICD-10-CM | POA: Diagnosis not present

## 2020-07-01 DIAGNOSIS — D4102 Neoplasm of uncertain behavior of left kidney: Secondary | ICD-10-CM | POA: Diagnosis not present

## 2020-07-01 LAB — HEPATIC FUNCTION PANEL
ALT: 7 — AB (ref 10–40)
AST: 8 — AB (ref 14–40)

## 2020-07-01 LAB — POCT INR: INR: 1.3 — AB (ref 0.9–1.1)

## 2020-07-02 DIAGNOSIS — I2510 Atherosclerotic heart disease of native coronary artery without angina pectoris: Secondary | ICD-10-CM | POA: Diagnosis not present

## 2020-07-02 DIAGNOSIS — E119 Type 2 diabetes mellitus without complications: Secondary | ICD-10-CM | POA: Diagnosis not present

## 2020-07-02 DIAGNOSIS — D649 Anemia, unspecified: Secondary | ICD-10-CM | POA: Diagnosis not present

## 2020-07-02 LAB — CBC AND DIFFERENTIAL
HCT: 24 — AB (ref 41–53)
Hemoglobin: 7.5 — AB (ref 13.5–17.5)
WBC: 10.7

## 2020-07-02 LAB — BASIC METABOLIC PANEL
BUN: 13 (ref 4–21)
CO2: 28 — AB (ref 13–22)
Chloride: 100 (ref 99–108)
Creatinine: 1 (ref 0.6–1.3)
Potassium: 3.9 (ref 3.4–5.3)
Sodium: 137 (ref 137–147)

## 2020-07-04 DIAGNOSIS — D500 Iron deficiency anemia secondary to blood loss (chronic): Secondary | ICD-10-CM | POA: Diagnosis not present

## 2020-07-05 ENCOUNTER — Inpatient Hospital Stay (HOSPITAL_BASED_OUTPATIENT_CLINIC_OR_DEPARTMENT_OTHER)
Admission: EM | Admit: 2020-07-05 | Discharge: 2020-07-11 | DRG: 291 | Disposition: A | Discharge status: Home / Self Care | Payer: BC Managed Care – PPO | Attending: Internal Medicine | Admitting: Internal Medicine

## 2020-07-05 ENCOUNTER — Encounter (HOSPITAL_BASED_OUTPATIENT_CLINIC_OR_DEPARTMENT_OTHER): Payer: Self-pay

## 2020-07-05 ENCOUNTER — Other Ambulatory Visit: Payer: Self-pay

## 2020-07-05 ENCOUNTER — Emergency Department (HOSPITAL_BASED_OUTPATIENT_CLINIC_OR_DEPARTMENT_OTHER): Payer: BC Managed Care – PPO

## 2020-07-05 DIAGNOSIS — N2889 Other specified disorders of kidney and ureter: Secondary | ICD-10-CM | POA: Diagnosis present

## 2020-07-05 DIAGNOSIS — E1169 Type 2 diabetes mellitus with other specified complication: Secondary | ICD-10-CM | POA: Diagnosis not present

## 2020-07-05 DIAGNOSIS — G4733 Obstructive sleep apnea (adult) (pediatric): Secondary | ICD-10-CM | POA: Diagnosis present

## 2020-07-05 DIAGNOSIS — I110 Hypertensive heart disease with heart failure: Secondary | ICD-10-CM | POA: Diagnosis not present

## 2020-07-05 DIAGNOSIS — R0602 Shortness of breath: Secondary | ICD-10-CM | POA: Diagnosis not present

## 2020-07-05 DIAGNOSIS — J4540 Moderate persistent asthma, uncomplicated: Secondary | ICD-10-CM | POA: Diagnosis present

## 2020-07-05 DIAGNOSIS — E871 Hypo-osmolality and hyponatremia: Secondary | ICD-10-CM | POA: Diagnosis not present

## 2020-07-05 DIAGNOSIS — K219 Gastro-esophageal reflux disease without esophagitis: Secondary | ICD-10-CM | POA: Diagnosis present

## 2020-07-05 DIAGNOSIS — Z8249 Family history of ischemic heart disease and other diseases of the circulatory system: Secondary | ICD-10-CM | POA: Diagnosis not present

## 2020-07-05 DIAGNOSIS — J9601 Acute respiratory failure with hypoxia: Secondary | ICD-10-CM | POA: Diagnosis not present

## 2020-07-05 DIAGNOSIS — Z7951 Long term (current) use of inhaled steroids: Secondary | ICD-10-CM

## 2020-07-05 DIAGNOSIS — M7989 Other specified soft tissue disorders: Secondary | ICD-10-CM | POA: Diagnosis not present

## 2020-07-05 DIAGNOSIS — I252 Old myocardial infarction: Secondary | ICD-10-CM | POA: Diagnosis not present

## 2020-07-05 DIAGNOSIS — Z6825 Body mass index (BMI) 25.0-25.9, adult: Secondary | ICD-10-CM

## 2020-07-05 DIAGNOSIS — D500 Iron deficiency anemia secondary to blood loss (chronic): Secondary | ICD-10-CM | POA: Diagnosis not present

## 2020-07-05 DIAGNOSIS — E538 Deficiency of other specified B group vitamins: Secondary | ICD-10-CM | POA: Diagnosis present

## 2020-07-05 DIAGNOSIS — I517 Cardiomegaly: Secondary | ICD-10-CM | POA: Diagnosis not present

## 2020-07-05 DIAGNOSIS — I10 Essential (primary) hypertension: Secondary | ICD-10-CM | POA: Diagnosis present

## 2020-07-05 DIAGNOSIS — Z7984 Long term (current) use of oral hypoglycemic drugs: Secondary | ICD-10-CM

## 2020-07-05 DIAGNOSIS — Z23 Encounter for immunization: Secondary | ICD-10-CM

## 2020-07-05 DIAGNOSIS — Z7982 Long term (current) use of aspirin: Secondary | ICD-10-CM

## 2020-07-05 DIAGNOSIS — Z87891 Personal history of nicotine dependence: Secondary | ICD-10-CM

## 2020-07-05 DIAGNOSIS — Z8371 Family history of colonic polyps: Secondary | ICD-10-CM

## 2020-07-05 DIAGNOSIS — Z820 Family history of epilepsy and other diseases of the nervous system: Secondary | ICD-10-CM | POA: Diagnosis not present

## 2020-07-05 DIAGNOSIS — Z79899 Other long term (current) drug therapy: Secondary | ICD-10-CM

## 2020-07-05 DIAGNOSIS — Z20822 Contact with and (suspected) exposure to covid-19: Secondary | ICD-10-CM | POA: Diagnosis present

## 2020-07-05 DIAGNOSIS — I509 Heart failure, unspecified: Secondary | ICD-10-CM

## 2020-07-05 DIAGNOSIS — E43 Unspecified severe protein-calorie malnutrition: Secondary | ICD-10-CM | POA: Insufficient documentation

## 2020-07-05 DIAGNOSIS — Z83438 Family history of other disorder of lipoprotein metabolism and other lipidemia: Secondary | ICD-10-CM

## 2020-07-05 DIAGNOSIS — I255 Ischemic cardiomyopathy: Secondary | ICD-10-CM | POA: Diagnosis present

## 2020-07-05 DIAGNOSIS — R509 Fever, unspecified: Secondary | ICD-10-CM

## 2020-07-05 DIAGNOSIS — I5043 Acute on chronic combined systolic (congestive) and diastolic (congestive) heart failure: Secondary | ICD-10-CM | POA: Diagnosis not present

## 2020-07-05 DIAGNOSIS — I2510 Atherosclerotic heart disease of native coronary artery without angina pectoris: Secondary | ICD-10-CM | POA: Diagnosis not present

## 2020-07-05 DIAGNOSIS — Z800 Family history of malignant neoplasm of digestive organs: Secondary | ICD-10-CM

## 2020-07-05 DIAGNOSIS — Z885 Allergy status to narcotic agent status: Secondary | ICD-10-CM

## 2020-07-05 DIAGNOSIS — E785 Hyperlipidemia, unspecified: Secondary | ICD-10-CM | POA: Diagnosis present

## 2020-07-05 DIAGNOSIS — Z951 Presence of aortocoronary bypass graft: Secondary | ICD-10-CM

## 2020-07-05 DIAGNOSIS — J9811 Atelectasis: Secondary | ICD-10-CM | POA: Diagnosis not present

## 2020-07-05 DIAGNOSIS — R911 Solitary pulmonary nodule: Secondary | ICD-10-CM | POA: Diagnosis present

## 2020-07-05 DIAGNOSIS — I5021 Acute systolic (congestive) heart failure: Secondary | ICD-10-CM | POA: Diagnosis not present

## 2020-07-05 HISTORY — DX: Dyspnea, unspecified: R06.00

## 2020-07-05 HISTORY — DX: Heart failure, unspecified: I50.9

## 2020-07-05 LAB — TROPONIN I (HIGH SENSITIVITY)
Troponin I (High Sensitivity): 17 ng/L (ref <18)
Troponin I (High Sensitivity): 15 ng/L (ref <18)
Troponin I (High Sensitivity): 17 ng/L (ref <18)

## 2020-07-05 LAB — CBC WITH DIFFERENTIAL/PLATELET
Abs Immature Granulocytes: 0.05 10*3/uL (ref 0.00–0.07)
Basophils Absolute: 0 10*3/uL (ref 0.0–0.1)
Basophils Relative: 0 %
Eosinophils Absolute: 0.1 10*3/uL (ref 0.0–0.5)
Eosinophils Relative: 1 %
HCT: 30.4 % — ABNORMAL LOW (ref 39.0–52.0)
Hemoglobin: 9.1 g/dL — ABNORMAL LOW (ref 13.0–17.0)
Immature Granulocytes: 0 %
Lymphocytes Relative: 9 %
Lymphs Abs: 1.2 10*3/uL (ref 0.7–4.0)
MCH: 23.8 pg — ABNORMAL LOW (ref 26.0–34.0)
MCHC: 29.9 g/dL — ABNORMAL LOW (ref 30.0–36.0)
MCV: 79.4 fL — ABNORMAL LOW (ref 80.0–100.0)
Monocytes Absolute: 1.2 10*3/uL — ABNORMAL HIGH (ref 0.1–1.0)
Monocytes Relative: 9 %
Neutro Abs: 10.7 10*3/uL — ABNORMAL HIGH (ref 1.7–7.7)
Neutrophils Relative %: 81 %
Platelets: 518 10*3/uL — ABNORMAL HIGH (ref 150–400)
RBC: 3.83 MIL/uL — ABNORMAL LOW (ref 4.22–5.81)
RDW: 19.8 % — ABNORMAL HIGH (ref 11.5–15.5)
WBC: 13.2 10*3/uL — ABNORMAL HIGH (ref 4.0–10.5)
nRBC: 0 % (ref 0.0–0.2)

## 2020-07-05 LAB — BASIC METABOLIC PANEL
Anion gap: 13 (ref 5–15)
BUN: 15 mg/dL (ref 8–23)
CO2: 24 mmol/L (ref 22–32)
Calcium: 9 mg/dL (ref 8.9–10.3)
Chloride: 97 mmol/L — ABNORMAL LOW (ref 98–111)
Creatinine, Ser: 1.01 mg/dL (ref 0.61–1.24)
GFR, Estimated: >60 mL/min (ref >60)
Glucose, Bld: 148 mg/dL — ABNORMAL HIGH (ref 70–99)
Potassium: 3.6 mmol/L (ref 3.5–5.1)
Sodium: 134 mmol/L — ABNORMAL LOW (ref 135–145)

## 2020-07-05 LAB — HIV ANTIBODY (ROUTINE TESTING W REFLEX): HIV Screen 4th Generation wRfx: NONREACTIVE

## 2020-07-05 LAB — RETICULOCYTES
Immature Retic Fract: 35.7 % — ABNORMAL HIGH (ref 2.3–15.9)
RBC.: 3.62 MIL/uL — ABNORMAL LOW (ref 4.22–5.81)
Retic Count, Absolute: 101.2 10*3/uL (ref 19.0–186.0)
Retic Ct Pct: 2.8 % (ref 0.4–3.1)

## 2020-07-05 LAB — FERRITIN: Ferritin: 1147 ng/mL — ABNORMAL HIGH (ref 24–336)

## 2020-07-05 LAB — GLUCOSE, CAPILLARY
Glucose-Capillary: 118 mg/dL — ABNORMAL HIGH (ref 70–99)
Glucose-Capillary: 146 mg/dL — ABNORMAL HIGH (ref 70–99)

## 2020-07-05 LAB — RESP PANEL BY RT-PCR (FLU A&B, COVID) ARPGX2
Influenza A by PCR: NEGATIVE
Influenza B by PCR: NEGATIVE
SARS Coronavirus 2 by RT PCR: NEGATIVE

## 2020-07-05 LAB — TSH: TSH: 0.369 u[IU]/mL (ref 0.350–4.500)

## 2020-07-05 LAB — IRON AND TIBC
Iron: 31 ug/dL — ABNORMAL LOW (ref 45–182)
Saturation Ratios: 17 % — ABNORMAL LOW (ref 17.9–39.5)
TIBC: 188 ug/dL — ABNORMAL LOW (ref 250–450)
UIBC: 157 ug/dL

## 2020-07-05 LAB — MAGNESIUM: Magnesium: 2.1 mg/dL (ref 1.7–2.4)

## 2020-07-05 LAB — PHOSPHORUS: Phosphorus: 2.9 mg/dL (ref 2.5–4.6)

## 2020-07-05 LAB — BRAIN NATRIURETIC PEPTIDE: B Natriuretic Peptide: 1435.5 pg/mL — ABNORMAL HIGH (ref 0.0–100.0)

## 2020-07-05 LAB — VITAMIN B12: Vitamin B-12: 202 pg/mL (ref 180–914)

## 2020-07-05 LAB — FOLATE: Folate: 4.3 ng/mL — ABNORMAL LOW (ref >5.9)

## 2020-07-05 LAB — T4, FREE: Free T4: 1.31 ng/dL — ABNORMAL HIGH (ref 0.61–1.12)

## 2020-07-05 MED ORDER — SODIUM CHLORIDE 0.9% FLUSH
3.0000 mL | Freq: Two times a day (BID) | INTRAVENOUS | Status: DC
  Administered 2020-07-05 – 2020-07-10 (×11): 3 mL via INTRAVENOUS
  Filled 2020-07-05: qty 3

## 2020-07-05 MED ORDER — AMLODIPINE BESYLATE 5 MG PO TABS
5.0000 mg | ORAL_TABLET | Freq: Every day | ORAL | Status: DC
  Administered 2020-07-06 – 2020-07-11 (×6): 5 mg via ORAL
  Filled 2020-07-05 (×6): qty 1

## 2020-07-05 MED ORDER — ALBUTEROL SULFATE (2.5 MG/3ML) 0.083% IN NEBU: 2.5000 mg | INHALATION_SOLUTION | Freq: Four times a day (QID) | RESPIRATORY_TRACT | Status: DC | PRN

## 2020-07-05 MED ORDER — LOSARTAN POTASSIUM 50 MG PO TABS
100.0000 mg | ORAL_TABLET | Freq: Every day | ORAL | Status: DC
  Administered 2020-07-06 – 2020-07-07 (×2): 100 mg via ORAL
  Filled 2020-07-05 (×2): qty 2

## 2020-07-05 MED ORDER — FLUTICASONE FUROATE-VILANTEROL 200-25 MCG/INH IN AEPB
1.0000 | INHALATION_SPRAY | Freq: Every day | RESPIRATORY_TRACT | Status: DC
  Filled 2020-07-05: qty 28

## 2020-07-05 MED ORDER — ASPIRIN EC 81 MG PO TBEC
81.0000 mg | DELAYED_RELEASE_TABLET | Freq: Every day | ORAL | Status: DC
  Administered 2020-07-05 – 2020-07-11 (×7): 81 mg via ORAL
  Filled 2020-07-05 (×7): qty 1

## 2020-07-05 MED ORDER — FUROSEMIDE 10 MG/ML IJ SOLN
40.0000 mg | Freq: Two times a day (BID) | INTRAMUSCULAR | Status: DC
  Administered 2020-07-05 – 2020-07-07 (×5): 40 mg via INTRAVENOUS
  Filled 2020-07-05 (×5): qty 4

## 2020-07-05 MED ORDER — FUROSEMIDE 10 MG/ML IJ SOLN
40.0000 mg | Freq: Once | INTRAMUSCULAR | Status: AC
  Administered 2020-07-05: 40 mg via INTRAVENOUS
  Filled 2020-07-05: qty 4

## 2020-07-05 MED ORDER — INSULIN ASPART 100 UNIT/ML ~~LOC~~ SOLN
0.0000 [IU] | Freq: Three times a day (TID) | SUBCUTANEOUS | Status: DC
  Administered 2020-07-07 (×3): 2 [IU] via SUBCUTANEOUS
  Administered 2020-07-08: 3 [IU] via SUBCUTANEOUS
  Administered 2020-07-08 – 2020-07-09 (×3): 2 [IU] via SUBCUTANEOUS
  Administered 2020-07-09: 8 [IU] via SUBCUTANEOUS
  Administered 2020-07-09 – 2020-07-10 (×4): 3 [IU] via SUBCUTANEOUS
  Administered 2020-07-11: 8 [IU] via SUBCUTANEOUS

## 2020-07-05 MED ORDER — ALPRAZOLAM 0.5 MG PO TABS: 0.5000 mg | ORAL_TABLET | Freq: Four times a day (QID) | ORAL | Status: DC | PRN

## 2020-07-05 MED ORDER — FOLIC ACID 1 MG PO TABS
1.0000 mg | ORAL_TABLET | Freq: Every day | ORAL | Status: DC
  Administered 2020-07-05 – 2020-07-11 (×7): 1 mg via ORAL
  Filled 2020-07-05 (×7): qty 1

## 2020-07-05 MED ORDER — SODIUM CHLORIDE 0.9 % IV SOLN: 250.0000 mL | INTRAVENOUS | Status: DC | PRN

## 2020-07-05 MED ORDER — NITROGLYCERIN 0.4 MG SL SUBL: 0.4000 mg | SUBLINGUAL_TABLET | SUBLINGUAL | Status: DC | PRN

## 2020-07-05 MED ORDER — ONDANSETRON HCL 4 MG/2ML IJ SOLN: 4.0000 mg | Freq: Four times a day (QID) | INTRAMUSCULAR | Status: DC | PRN

## 2020-07-05 MED ORDER — ROSUVASTATIN CALCIUM 20 MG PO TABS
20.0000 mg | ORAL_TABLET | Freq: Every day | ORAL | Status: DC
  Administered 2020-07-06 – 2020-07-11 (×6): 20 mg via ORAL
  Filled 2020-07-05 (×7): qty 1

## 2020-07-05 MED ORDER — HEPARIN SODIUM (PORCINE) 5000 UNIT/ML IJ SOLN
5000.0000 [IU] | Freq: Three times a day (TID) | INTRAMUSCULAR | Status: DC
  Administered 2020-07-05 – 2020-07-06 (×2): 5000 [IU] via SUBCUTANEOUS
  Filled 2020-07-05 (×2): qty 1

## 2020-07-05 MED ORDER — FLUTICASONE FUROATE-VILANTEROL 100-25 MCG/INH IN AEPB
1.0000 | INHALATION_SPRAY | Freq: Every day | RESPIRATORY_TRACT | Status: DC
  Filled 2020-07-05: qty 28

## 2020-07-05 MED ORDER — ACETAMINOPHEN 325 MG PO TABS
650.0000 mg | ORAL_TABLET | ORAL | Status: DC | PRN
  Administered 2020-07-06: 650 mg via ORAL
  Filled 2020-07-05: qty 2

## 2020-07-05 MED ORDER — INSULIN ASPART 100 UNIT/ML ~~LOC~~ SOLN
0.0000 [IU] | Freq: Every day | SUBCUTANEOUS | Status: DC
  Administered 2020-07-08 – 2020-07-09 (×2): 2 [IU] via SUBCUTANEOUS

## 2020-07-05 MED ORDER — ALBUTEROL SULFATE HFA 108 (90 BASE) MCG/ACT IN AERS
2.0000 | INHALATION_SPRAY | Freq: Four times a day (QID) | RESPIRATORY_TRACT | Status: DC | PRN
  Filled 2020-07-05: qty 6.7

## 2020-07-05 MED ORDER — SODIUM CHLORIDE 0.9% FLUSH
3.0000 mL | INTRAVENOUS | Status: DC | PRN
  Filled 2020-07-05: qty 3

## 2020-07-05 NOTE — Progress Notes (Signed)
07/05/2020 Patient transfer from [facility] via [place] [facility]. He is alert oriented to person, place, time and situation. Patient was place on oxygen, skin was assess and it was intact. He does however have bilateral lower extremities swelling. [facility] RN.

## 2020-07-05 NOTE — ED Notes (Signed)
Pt ambulated with pulse Ox on room air. Pt 94% on RA starting and noted to be 88% while ambulating and returned to 94% after getting back in bed. Pt states his cough is gone but still feels short of breath.

## 2020-07-05 NOTE — ED Notes (Signed)
Pack of crackers and water provided to pt

## 2020-07-05 NOTE — ED Provider Notes (Signed)
[facility] Provider Note   CSN: [id number] Arrival date & time: 07/05/20  4098     History Chief Complaint  Patient presents with  . Shortness of Breath    [facility] is a 64 y.o. male.  Pt presents to the ED today with sob.  Pt said he's been getting worse for the past 3 days.  He has a hx of CAD, HTN, hyperlipidemia, and DM, and a renal mass.  He had a biopsy of this mass on 11/23 and was admitted for observation over night.  He has chronic anemia and had an iron infusion yesterday.  He said he can't walk up the stairs in his house without getting sob.  He's been using his inhalers without improvement in sx.  He denies f/c.  He's fully vaccinated (+ booster) against Covid.  He has noticed some increased swelling in both of his legs.          Past Medical History:  Diagnosis Date  . Allergic rhinitis   . Allergy   . Anxiety   . Asthma   . Diabetes ([place])   . Diverticulitis   . GERD (gastroesophageal reflux disease)    very occ  . History of heart attack   . HLD (hyperlipidemia)   . HTN (hypertension)   . Myocardial infarction ([place]) 2000  . Sleep apnea    wears cpap   . Tubular adenoma of colon 09/2008    Patient Active Problem List   Diagnosis Date Noted  . Acute respiratory failure with hypoxia ([place]) 07/05/2020  . Iron deficiency anemia due to chronic blood loss 06/19/2020  . OSA (obstructive sleep apnea) 01/09/2018  . Pulmonary nodule, left 03/15/2017  . Former smoker 11/04/2014  . Insomnia 05/27/2014  . Plantar wart of left foot 11/27/2010  . Type 2 diabetes mellitus with other specified complication ([place]) 11/91/4782  . PSA, INCREASED 09/15/2009  . Diverticulitis of colon 05/08/2009  . Asthma, moderate persistent 06/19/2008  . ANKLE PAIN, CHRONIC 06/05/2007  . Allergic rhinitis 05/17/2007  . ERECTILE DYSFUNCTION, SECONDARY TO MEDICATION 05/17/2007  . Hyperlipidemia 04/06/2007  . Anxiety state 04/06/2007  . Essential  hypertension 04/06/2007  . Coronary artery disease involving native coronary artery of native heart without angina pectoris 04/06/2007    Past Surgical History:  Procedure Laterality Date  . CARDIAC CATHETERIZATION N/A 07/20/2016   Procedure: Left Heart Cath and Cors/Grafts Angiography;  Surgeon: Belva Crome, MD;  Location: [facility];  Service: Cardiovascular;  Laterality: N/A;  . CATARACT EXTRACTION Bilateral 06/06/2019   Dr. Tommy Rainwater. oct left, dec right 2020   . COLONOSCOPY    . CORONARY ANGIOPLASTY    . CORONARY ARTERY BYPASS GRAFT  06/1999  . POLYPECTOMY    . TONSILLECTOMY     late50's early 23's       Family History  Problem Relation Age of Onset  . Hypertension Mother   . Alzheimer's disease Mother   . Colon polyps Mother   . Hyperlipidemia Father   . Hypertension Father   . Colon polyps Father   . Colon cancer Paternal Aunt   . Pancreatic cancer Neg Hx   . Stomach cancer Neg Hx   . Thyroid disease Neg Hx   . Esophageal cancer Neg Hx   . Rectal cancer Neg Hx     Social History   Tobacco Use  . Smoking status: Former Smoker    Packs/day: 0.50    Years: 4.00  Pack years: 2.00    Types: Cigarettes    Quit date: 09/29/1977    Years since quitting: 42.7  . Smokeless tobacco: Never Used  . Tobacco comment: quit on 1980  Vaping Use  . Vaping Use: Never used  Substance Use Topics  . Alcohol use: Not Currently  . Drug use: No    Home Medications Prior to Admission medications   Medication Sig Start Date End Date Taking? Authorizing Provider  ADVAIR HFA [phone] MCG/ACT inhaler TAKE 2 PUFFS BY MOUTH TWICE A DAY 06/15/20   Marin Olp, MD  albuterol (VENTOLIN HFA) 108 (90 Base) MCG/ACT inhaler Inhale 2 puffs into the lungs every 6 (six) hours as needed for wheezing or shortness of breath. 06/17/20   Marin Olp, MD  ALPRAZolam Duanne Moron) 0.5 MG tablet Take 1 tablet (0.5 mg total) by mouth every 6 (six) hours as needed. 06/23/20   Marin Olp, MD  amLODipine (NORVASC) 5 MG tablet TAKE 1 TABLET BY MOUTH EVERY DAY 02/21/20   Marin Olp, MD  aspirin EC 81 MG tablet Take 1 tablet (81 mg total) by mouth daily. 07/11/17   Josue Hector, MD  Azelastine-Fluticasone 137-50 MCG/ACT SUSP Place 1 spray into the nose as needed.    [provider]  bisoprolol-hydrochlorothiazide (ZIAC) 10-6.25 MG tablet Take 1 tablet by mouth daily. 05/22/20   Marin Olp, MD  budesonide-formoterol [facility]) 160-4.5 MCG/ACT inhaler Inhale 2 puffs into the lungs in the morning and at bedtime. 06/17/20   Marin Olp, MD  Canagliflozin-metFORMIN HCl (INVOKAMET) [phone] MG TABS Take 1 tablet by mouth 2 (two) times daily. 06/11/20   Marin Olp, MD  doxycycline (VIBRA-TABS) 100 MG tablet Take 1 tablet (100 mg total) by mouth 2 (two) times daily. 06/24/20   Lucretia Kern, DO  famciclovir (FAMVIR) 500 MG tablet TAKE 3 TABLETS BY MOUTH EVERY DAY AS NEEDED AT FIRST SIGN OF OUTBREAK OF FEVER BLISTER 04/25/19   Marin Olp, MD  folic acid (FOLVITE) 086 MCG tablet Take 1,600 mcg by mouth daily.      [provider]  glimepiride (AMARYL) 2 MG tablet TAKE 1 TABLET (2 MG TOTAL) BY MOUTH DAILY BEFORE BREAKFAST. 02/08/20   Marin Olp, MD  glucose blood (BAYER CONTOUR NEXT TEST) test strip Use to test blood sugars daily. Dx: E11.9 09/29/18   Marin Olp, MD  losartan (COZAAR) 100 MG tablet TAKE 1 TABLET BY MOUTH EVERY DAY 02/08/20   Marin Olp, MD  Multiple Vitamin (MULTIVITAMIN) tablet Take 1 tablet by mouth daily.      [provider]  nitroGLYCERIN (NITROSTAT) 0.4 MG SL tablet PLACE 1 TABLET UNDER THE TONGUE EVERY 5 MINUTES AS NEEDED FOR CHEST PAIN. 07/20/19   Marin Olp, MD  omega-3 acid ethyl esters (LOVAZA) 1 g capsule Take 1 capsule (1 g total) by mouth 3 (three) times daily. 01/14/20   Marin Olp, MD  oxymetazoline (AFRIN) 0.05 % nasal spray Place 1 spray into both nostrils 2 (two) times daily  as needed for congestion.    [provider]  rosuvastatin (CRESTOR) 20 MG tablet TAKE 1 TABLET BY MOUTH DAILY 02/13/20   Marin Olp, MD  Semaglutide,0.25 or 0.5MG /DOS, (OZEMPIC, 0.25 OR 0.5 MG/DOSE,) 2 MG/1.5ML SOPN Inject 0.25 mg as directed once a week for 28 days, THEN 0.5 mg once a week for 14 days. 06/20/20 08/01/20  Marin Olp, MD  zaleplon (SONATA) 10 MG capsule  TAKE 1 CAPSULE (10 MG TOTAL) BY MOUTH AT BEDTIME AS NEEDED. FOR SLEEP 03/07/20   Marin Olp, MD    Allergies    Morphine sulfate  Review of Systems   Review of Systems  Respiratory: Positive for shortness of breath.   All other systems reviewed and are negative.   Physical Exam Updated Vital Signs BP (!) 142/77   Pulse 80   Temp 97.9 F (36.6 C) (Oral)   Resp (!) 30   Ht 5\' 11"  (1.803 m)   Wt 84.8 kg   SpO2 92%   BMI 26.08 kg/m   Physical Exam Vitals and nursing note reviewed.  Constitutional:      Appearance: He is well-developed.  HENT:     Head: Normocephalic and atraumatic.     Mouth/Throat:     Mouth: Mucous membranes are moist.     Pharynx: Oropharynx is clear.  Eyes:     Extraocular Movements: Extraocular movements intact.     Pupils: Pupils are equal, round, and reactive to light.  Cardiovascular:     Rate and Rhythm: Normal rate and regular rhythm.  Pulmonary:     Effort: Tachypnea present.     Breath sounds: Rhonchi present.  Abdominal:     General: Bowel sounds are normal.     Palpations: Abdomen is soft.  Musculoskeletal:        General: Normal range of motion.     Cervical back: Normal range of motion and neck supple.     Right lower leg: Edema present.     Left lower leg: Edema present.  Skin:    General: Skin is warm.     Capillary Refill: Capillary refill takes less than 2 seconds.  Neurological:     General: No focal deficit present.     Mental Status: He is alert and oriented to person, place, and time.  Psychiatric:        Mood and Affect: Mood  normal.        Behavior: Behavior normal.     ED Results / Procedures / Treatments   Labs (all labs ordered are listed, but only abnormal results are displayed) Labs Reviewed  BASIC METABOLIC PANEL - Abnormal; Notable for the following components:      Result Value   Sodium 134 (*)    Chloride 97 (*)    Glucose, Bld 148 (*)    All other components within normal limits  CBC WITH DIFFERENTIAL/PLATELET - Abnormal; Notable for the following components:   WBC 13.2 (*)    RBC 3.83 (*)    Hemoglobin 9.1 (*)    HCT 30.4 (*)    MCV 79.4 (*)    MCH 23.8 (*)    MCHC 29.9 (*)    RDW 19.8 (*)    Platelets 518 (*)    Neutro Abs 10.7 (*)    Monocytes Absolute 1.2 (*)    All other components within normal limits  BRAIN NATRIURETIC PEPTIDE - Abnormal; Notable for the following components:   B Natriuretic Peptide 1,435.5 (*)    All other components within normal limits  RESP PANEL BY RT-PCR (FLU A&B, COVID) [id number]  TROPONIN I (HIGH SENSITIVITY)  TROPONIN I (HIGH SENSITIVITY)    EKG EKG Interpretation  Date/Time:  Saturday July 05 2020 09:19:31 EST Ventricular Rate:  82 PR Interval:    QRS Duration: 139 QT Interval:  408 QTC Calculation: 477 R Axis:   -40 Text Interpretation: Sinus rhythm Right bundle branch block Probable  inferior infarct, age indeterminate Lateral leads are also involved new since last EKG in 2000 Confirmed by Isla Pence ([id number]) on 07/05/2020 9:30:45 AM   Radiology DG Chest 2 View  Result Date: 07/05/2020 CLINICAL DATA:  Shortness of breath EXAM: CHEST - 2 VIEW COMPARISON:  PET-CT May 20, 2020 FINDINGS: Previously noted nodular lesion in left lower lobe is apparent on the lateral view measuring 3.3 x 3.1 cm. It is difficult to discern on the frontal view. There is cardiomegaly with mild pulmonary venous hypertension. Mild interstitial edema. Atelectasis in the bases. No consolidation appreciable. Status post coronary artery bypass grafting. No  appreciable bone lesions. No pneumothorax. IMPRESSION: The nodular opacity in the left lower lobe seen on recent PET study not well seen on frontal view. It is appreciable on the lateral view measuring 3.3 x 3.1 cm. There is cardiomegaly with a degree of pulmonary vascular congestion interstitial edema, suspicious for a degree of congestive heart failure. Bibasilar atelectasis. No consolidation. Status post coronary artery bypass grafting. Electronically Signed   By: Lowella Grip III M.D.   On: 07/05/2020 10:00   US Venous Img Lower Bilateral  Result Date: 07/05/2020 CLINICAL DATA:  Bilateral leg swelling EXAM: BILATERAL LOWER EXTREMITY VENOUS DOPPLER ULTRASOUND TECHNIQUE: Gray-scale sonography with graded compression, as well as color Doppler and duplex ultrasound were performed to evaluate the lower extremity deep venous systems from the level of the common femoral vein and including the common femoral, femoral, profunda femoral, popliteal and calf veins including the posterior tibial, peroneal and gastrocnemius veins when visible. The superficial great saphenous vein was also interrogated. Spectral Doppler was utilized to evaluate flow at rest and with distal augmentation maneuvers in the common femoral, femoral and popliteal veins. COMPARISON:  None. FINDINGS: RIGHT LOWER EXTREMITY Common Femoral Vein: No evidence of thrombus. Normal compressibility, respiratory phasicity and response to augmentation. Saphenofemoral Junction: No evidence of thrombus. Normal compressibility and flow on color Doppler imaging. Profunda Femoral Vein: No evidence of thrombus. Normal compressibility and flow on color Doppler imaging. Femoral Vein: No evidence of thrombus. Normal compressibility, respiratory phasicity and response to augmentation. Popliteal Vein: No evidence of thrombus. Normal compressibility, respiratory phasicity and response to augmentation. Calf Veins: No evidence of thrombus. Normal compressibility and  flow on color Doppler imaging. Superficial Great Saphenous Vein: No evidence of thrombus. Normal compressibility. Venous Reflux:  None. Other Findings:  None. LEFT LOWER EXTREMITY Common Femoral Vein: No evidence of thrombus. Normal compressibility, respiratory phasicity and response to augmentation. Saphenofemoral Junction: No evidence of thrombus. Normal compressibility and flow on color Doppler imaging. Profunda Femoral Vein: No evidence of thrombus. Normal compressibility and flow on color Doppler imaging. Femoral Vein: No evidence of thrombus. Normal compressibility, respiratory phasicity and response to augmentation. Popliteal Vein: No evidence of thrombus. Normal compressibility, respiratory phasicity and response to augmentation. Calf Veins: No evidence of thrombus. Normal compressibility and flow on color Doppler imaging. Superficial Great Saphenous Vein: No evidence of thrombus. Normal compressibility. Venous Reflux:  None. Other Findings:  None. IMPRESSION: No evidence of deep venous thrombosis in either lower extremity. Electronically Signed   By: Davina Poke D.O.   On: 07/05/2020 10:42    Procedures Procedures (including critical care time)  Medications Ordered in ED Medications  furosemide (LASIX) injection 40 mg (40 mg Intravenous Given 07/05/20 1037)    ED Course  I have reviewed the triage vital signs and the nursing notes.  Pertinent labs & imaging results that were available during my care of the patient  were reviewed by me and considered in my medical decision making (see chart for details).    MDM Rules/Calculators/A&P                          Pt put on 2L oxygen upon arrival for comfort.  Pt is feeling better after lasix.  He was ambulated just a short trip to the bathroom and back to the room.  While ambulating, his O2 sat dropped to 88% and his RR went up to the 30s.  Pt put back on 2L oxygen.  He has rested and is still tachypneic.  He was d/w Dr. Posey Pronto (triad) for  admission to [facility].  Covid was negative.  CRITICAL CARE Performed by: Isla Pence   Total critical care time: 30 minutes  Critical care time was exclusive of separately billable procedures and treating other patients.  Critical care was necessary to treat or prevent imminent or life-threatening deterioration.  Critical care was time spent personally by me on the following activities: development of treatment plan with patient and/or surrogate as well as nursing, discussions with consultants, evaluation of patient's response to treatment, examination of patient, obtaining history from patient or surrogate, ordering and performing treatments and interventions, ordering and review of laboratory studies, ordering and review of radiographic studies, pulse oximetry and re-evaluation of patient's condition.  [facility] was evaluated in Emergency Department on 07/05/2020 for the symptoms described in the history of present illness. He was evaluated in the context of the global COVID-19 pandemic, which necessitated consideration that the patient might be at risk for infection with the SARS-CoV-2 virus that causes COVID-19. Institutional protocols and algorithms that pertain to the evaluation of patients at risk for COVID-19 are in a state of rapid change based on information released by regulatory bodies including the CDC and federal and state organizations. These policies and algorithms were followed during the patient's care in the ED.   Final Clinical Impression(s) / ED Diagnoses Final diagnoses:  Acute on chronic congestive heart failure, unspecified heart failure type [facility])    Rx / DC Orders ED Discharge Orders    None       Isla Pence, MD 07/05/20 1235

## 2020-07-05 NOTE — ED Notes (Signed)
Pt placed back on 2L [place]. 

## 2020-07-05 NOTE — Progress Notes (Signed)
Pt no longer wears his cpap machine

## 2020-07-05 NOTE — H&P (Signed)
History and Physical   [name] CVE:[id number] DOB: 02/17/1956 DOA: 07/05/2020  Referring MD/NP/PA: Dr. Isla Pence  PCP: Marin Olp, MD   Outpatient Specialists: Dr. Jenkins Rouge  Patient coming from: [facility] [place]  Chief Complaint: Shortness of breath  HPI: Larry Garner is a 64 y.o. male with medical history significant of coronary artery disease status post coronary artery bypass grafting 21 years ago with subsequent cardiac cath 15 years later which was clean, hypertension, hyperlipidemia, diabetes, and renal mass which was biopsied on 1123.  After the biopsy he was admitted to the hospital overnight.  Patient also has chronic anemia due to iron deficiency and he is status post iron infusion therapy only yesterday.  He is here with progressive shortness of breath with exertion over the last 3 days.  Also noticed pedal edema.  He denied PND orthopnea but has noted difficulty with breathing and mild hypoxia.  He denied any chest pain.  He feels tired a lot.  Patient's hemoglobin is between 9 and 10 now.  Baseline has been between 10 and 11.  No recent bleeds.  No melena no bright red blood per rectum.  Patient has elevated BNP as well as chest x-ray suggestive of pulmonary edema.  Is suspected to have new onset CHF and is being admitted for further evaluation and treatment.  ED Course: Temperature 99.7 blood pressure 153/72 pulse 80 respirate 32 oxygen sats 90% on room air.  Troponin is 17.  Iron level 31 ferritin 1147 with 02% saturation folic acid 4.3.  Vitamin B12 is 202.  His T4 is 1.31.  CBC appears to be stable as well as BMP.  Hemoglobin is 9.1.  2 months ago it was 11.3.  Chest x-ray showed nodular opacity in the left lower lobe which was seen on recent PET scan.  It is 3.3 x 3.1 cm.  There is cardiomegaly with pulmonary vascular congestion with interstitial edema.  Patient being admitted to the hospital with acute CHF.  Review of Systems: As per HPI otherwise 10  point review of systems negative.    Past Medical History:  Diagnosis Date   Allergic rhinitis    Allergy    Anxiety    Asthma    Diabetes ([place])    Diverticulitis    GERD (gastroesophageal reflux disease)    very occ   History of heart attack    HLD (hyperlipidemia)    HTN (hypertension)    Myocardial infarction ([place]) 2000   Sleep apnea    wears cpap    Tubular adenoma of colon 09/2008    Past Surgical History:  Procedure Laterality Date   CARDIAC CATHETERIZATION N/A 07/20/2016   Procedure: Left Heart Cath and Cors/Grafts Angiography;  Surgeon: Belva Crome, MD;  Location: [facility];  Service: Cardiovascular;  Laterality: N/A;   CATARACT EXTRACTION Bilateral 06/06/2019   Dr. Tommy Rainwater. oct left, dec right 2020    COLONOSCOPY     CORONARY ANGIOPLASTY     CORONARY ARTERY BYPASS GRAFT  06/1999   POLYPECTOMY     TONSILLECTOMY     late50's early 60's     reports that he quit smoking about 42 years ago. His smoking use included cigarettes. He has a 2.00 pack-year smoking history. He has never used smokeless tobacco. He reports previous alcohol use. He reports that he does not use drugs.  Allergies  Allergen Reactions   Morphine Sulfate Nausea And Vomiting and Swelling    Family  History  Problem Relation Age of Onset   Hypertension Mother    Alzheimer's disease Mother    Colon polyps Mother    Hyperlipidemia Father    Hypertension Father    Colon polyps Father    Colon cancer Paternal Aunt    Pancreatic cancer Neg Hx    Stomach cancer Neg Hx    Thyroid disease Neg Hx    Esophageal cancer Neg Hx    Rectal cancer Neg Hx      Prior to Admission medications   Medication Sig Start Date End Date Taking? Authorizing Provider  ADVAIR HFA [phone] MCG/ACT inhaler TAKE 2 PUFFS BY MOUTH TWICE A DAY 06/15/20   Marin Olp, MD  albuterol (VENTOLIN HFA) 108 (90 Base) MCG/ACT inhaler Inhale 2 puffs into the lungs every 6 (six) hours as  needed for wheezing or shortness of breath. 06/17/20   Marin Olp, MD  ALPRAZolam Duanne Moron) 0.5 MG tablet Take 1 tablet (0.5 mg total) by mouth every 6 (six) hours as needed. 06/23/20   Marin Olp, MD  amLODipine (NORVASC) 5 MG tablet TAKE 1 TABLET BY MOUTH EVERY DAY 02/21/20   Marin Olp, MD  aspirin EC 81 MG tablet Take 1 tablet (81 mg total) by mouth daily. 07/11/17   Josue Hector, MD  Azelastine-Fluticasone 137-50 MCG/ACT SUSP Place 1 spray into the nose as needed.    [provider]  bisoprolol-hydrochlorothiazide (ZIAC) 10-6.25 MG tablet Take 1 tablet by mouth daily. 05/22/20   Marin Olp, MD  budesonide-formoterol [facility]) 160-4.5 MCG/ACT inhaler Inhale 2 puffs into the lungs in the morning and at bedtime. 06/17/20   Marin Olp, MD  Canagliflozin-metFORMIN HCl (INVOKAMET) [phone] MG TABS Take 1 tablet by mouth 2 (two) times daily. 06/11/20   Marin Olp, MD  doxycycline (VIBRA-TABS) 100 MG tablet Take 1 tablet (100 mg total) by mouth 2 (two) times daily. 06/24/20   Lucretia Kern, DO  famciclovir (FAMVIR) 500 MG tablet TAKE 3 TABLETS BY MOUTH EVERY DAY AS NEEDED AT FIRST SIGN OF OUTBREAK OF FEVER BLISTER 04/25/19   Marin Olp, MD  folic acid (FOLVITE) 784 MCG tablet Take 1,600 mcg by mouth daily.      [provider]  glimepiride (AMARYL) 2 MG tablet TAKE 1 TABLET (2 MG TOTAL) BY MOUTH DAILY BEFORE BREAKFAST. 02/08/20   Marin Olp, MD  glucose blood (BAYER CONTOUR NEXT TEST) test strip Use to test blood sugars daily. Dx: E11.9 09/29/18   Marin Olp, MD  losartan (COZAAR) 100 MG tablet TAKE 1 TABLET BY MOUTH EVERY DAY 02/08/20   Marin Olp, MD  Multiple Vitamin (MULTIVITAMIN) tablet Take 1 tablet by mouth daily.      [provider]  nitroGLYCERIN (NITROSTAT) 0.4 MG SL tablet PLACE 1 TABLET UNDER THE TONGUE EVERY 5 MINUTES AS NEEDED FOR CHEST PAIN. 07/20/19   Marin Olp, MD  omega-3 acid ethyl esters  (LOVAZA) 1 g capsule Take 1 capsule (1 g total) by mouth 3 (three) times daily. 01/14/20   Marin Olp, MD  oxymetazoline (AFRIN) 0.05 % nasal spray Place 1 spray into both nostrils 2 (two) times daily as needed for congestion.    [provider]  rosuvastatin (CRESTOR) 20 MG tablet TAKE 1 TABLET BY MOUTH DAILY 02/13/20   Marin Olp, MD  Semaglutide,0.25 or 0.5MG /DOS, (OZEMPIC, 0.25 OR 0.5 MG/DOSE,) 2 MG/1.5ML SOPN Inject 0.25 mg as directed once a week for 28  days, THEN 0.5 mg once a week for 14 days. 06/20/20 08/01/20  Marin Olp, MD  zaleplon (SONATA) 10 MG capsule TAKE 1 CAPSULE (10 MG TOTAL) BY MOUTH AT BEDTIME AS NEEDED. FOR SLEEP 03/07/20   Marin Olp, MD    Physical Exam: Vitals:   07/05/20 1436 07/05/20 1530 07/05/20 1704 07/05/20 1829  BP: (!) 143/78 126/63 137/79 139/81  Pulse: 79 80 80 83  Resp: 15 16 16 18   Temp:    98.1 F (36.7 C)  TempSrc:    Oral  SpO2: 94% 91% 93% 96%  Weight:    80.3 kg  Height:    5\' 11"  (1.803 m)      Constitutional: Acutely ill looking, no distress Vitals:   07/05/20 1436 07/05/20 1530 07/05/20 1704 07/05/20 1829  BP: (!) 143/78 126/63 137/79 139/81  Pulse: 79 80 80 83  Resp: 15 16 16 18   Temp:    98.1 F (36.7 C)  TempSrc:    Oral  SpO2: 94% 91% 93% 96%  Weight:    80.3 kg  Height:    5\' 11"  (1.803 m)   Eyes: PERRL, lids and conjunctivae normal ENMT: Mucous membranes are moist. Posterior pharynx clear of any exudate or lesions.Normal dentition.  Neck: normal, supple, no masses, no thyromegaly Respiratory: Coarse breath sounds with some mild bibasilar crackles normal respiratory effort. No accessory muscle use.  Cardiovascular: Regular rate and rhythm, no murmurs / rubs / gallops.  1+ extremity edema. 2+ pedal pulses. No carotid bruits.  Abdomen: no tenderness, no masses palpated. No hepatosplenomegaly. Bowel sounds positive.  Musculoskeletal: no clubbing / cyanosis. No joint deformity upper and lower  extremities. Good ROM, no contractures. Normal muscle tone.  Skin: no rashes, lesions, ulcers. No induration Neurologic: CN 2-12 grossly intact. Sensation intact, DTR normal. Strength 5/5 in all 4.  Psychiatric: Normal judgment and insight. Alert and oriented x 3. Normal mood.     Labs on Admission: I have personally reviewed following labs and imaging studies  CBC: Recent Labs  Lab 07/05/20 0912  WBC 13.2*  NEUTROABS 10.7*  HGB 9.1*  HCT 30.4*  MCV 79.4*  PLT 681*   Basic Metabolic Panel: Recent Labs  Lab 07/05/20 0912 07/05/20 0929  NA 134*  --   K 3.6  --   CL 97*  --   CO2 24  --   GLUCOSE 148*  --   BUN 15  --   CREATININE 1.01  --   CALCIUM 9.0  --   MG  --  2.1  PHOS  --  2.9   GFR: Estimated Creatinine Clearance: 77.7 mL/min (by C-G formula based on SCr of 1.01 mg/dL). Liver Function Tests: No results for input(s): AST, ALT, ALKPHOS, BILITOT, PROT, ALBUMIN in the last 168 hours. No results for input(s): LIPASE, AMYLASE in the last 168 hours. No results for input(s): AMMONIA in the last 168 hours. Coagulation Profile: No results for input(s): INR, PROTIME in the last 168 hours. Cardiac Enzymes: No results for input(s): CKTOTAL, CKMB, CKMBINDEX, TROPONINI in the last 168 hours. BNP (last 3 results) No results for input(s): PROBNP in the last 8760 hours. HbA1C: No results for input(s): HGBA1C in the last 72 hours. CBG: Recent Labs  Lab 07/05/20 1841  GLUCAP 118*   Lipid Profile: No results for input(s): CHOL, HDL, LDLCALC, TRIG, CHOLHDL, LDLDIRECT in the last 72 hours. Thyroid Function Tests: Recent Labs    07/05/20 1253 07/05/20 1453  TSH 0.369  --  FREET4  --  1.31*   Anemia Panel: Recent Labs    07/05/20 1253 07/05/20 1453  VITAMINB12  --  202  FOLATE  --  4.3*  FERRITIN  --  1,147*  TIBC  --  188*  IRON  --  31*  RETICCTPCT 2.8  --    Urine analysis:    Component Value Date/Time   COLORURINE YELLOW 02/21/2020 2043    APPEARANCEUR CLEAR 02/21/2020 2043   LABSPEC 1.010 02/21/2020 2043   PHURINE 6.0 02/21/2020 2043   GLUCOSEU >=500 (A) 02/21/2020 2043   HGBUR TRACE (A) 02/21/2020 2043   HGBUR negative 09/08/2009 0812   BILIRUBINUR NEGATIVE 02/21/2020 2043   BILIRUBINUR n 11/04/2016 0949   KETONESUR NEGATIVE 02/21/2020 2043   PROTEINUR NEGATIVE 02/21/2020 2043   UROBILINOGEN 0.2 11/04/2016 0949   UROBILINOGEN 0.2 09/08/2009 0812   NITRITE NEGATIVE 02/21/2020 2043   LEUKOCYTESUR NEGATIVE 02/21/2020 2043   Sepsis Labs: @LABRCNTIP (procalcitonin:4,lacticidven:4) ) Recent Results (from the past 240 hour(s))  Resp Panel by RT-PCR (Flu A&B, Covid) Nasopharyngeal Swab     Status: None   Collection Time: 07/05/20  9:27 AM   Specimen: Nasopharyngeal Swab; Nasopharyngeal(NP) swabs in vial transport medium  Result Value Ref Range Status   SARS Coronavirus 2 by RT PCR NEGATIVE NEGATIVE Final    Comment: (NOTE) SARS-CoV-2 target nucleic acids are NOT DETECTED.  The SARS-CoV-2 RNA is generally detectable in upper respiratory specimens during the acute phase of infection. The lowest concentration of SARS-CoV-2 viral copies this assay can detect is 138 copies/mL. A negative result does not preclude SARS-Cov-2 infection and should not be used as the sole basis for treatment or other patient management decisions. A negative result may occur with  improper specimen collection/handling, submission of specimen other than nasopharyngeal swab, presence of viral mutation(s) within the areas targeted by this assay, and inadequate number of viral copies(<138 copies/mL). A negative result must be combined with clinical observations, patient history, and epidemiological information. The expected result is Negative.  Fact Sheet for Patients:  EntrepreneurPulse.com.au  Fact Sheet for Healthcare Providers:  IncredibleEmployment.be  This test is no t yet approved or cleared by the  [place] FDA and  has been authorized for detection and/or diagnosis of SARS-CoV-2 by FDA under an Emergency Use Authorization (EUA). This EUA will remain  in effect (meaning this test can be used) for the duration of the COVID-19 declaration under Section 564(b)(1) of the Act, 21 U.S.C.section 360bbb-3(b)(1), unless the authorization is terminated  or revoked sooner.       Influenza A by PCR NEGATIVE NEGATIVE Final   Influenza B by PCR NEGATIVE NEGATIVE Final    Comment: (NOTE) The Xpert Xpress SARS-CoV-2/FLU/RSV plus assay is intended as an aid in the diagnosis of influenza from Nasopharyngeal swab specimens and should not be used as a sole basis for treatment. Nasal washings and aspirates are unacceptable for Xpert Xpress SARS-CoV-2/FLU/RSV testing.  Fact Sheet for Patients: EntrepreneurPulse.com.au  Fact Sheet for Healthcare Providers: IncredibleEmployment.be  This test is not yet approved or cleared by the [place] FDA and has been authorized for detection and/or diagnosis of SARS-CoV-2 by FDA under an Emergency Use Authorization (EUA). This EUA will remain in effect (meaning this test can be used) for the duration of the COVID-19 declaration under Section 564(b)(1) of the Act, 21 U.S.C. section 360bbb-3(b)(1), unless the authorization is terminated or revoked.  Performed at [facility], [address] [place], [place] [address]  Radiological Exams on Admission: DG Chest 2 View  Result Date: 07/05/2020 CLINICAL DATA:  Shortness of breath EXAM: CHEST - 2 VIEW COMPARISON:  PET-CT May 20, 2020 FINDINGS: Previously noted nodular lesion in left lower lobe is apparent on the lateral view measuring 3.3 x 3.1 cm. It is difficult to discern on the frontal view. There is cardiomegaly with mild pulmonary venous hypertension. Mild interstitial edema. Atelectasis in the bases. No consolidation appreciable. Status post  coronary artery bypass grafting. No appreciable bone lesions. No pneumothorax. IMPRESSION: The nodular opacity in the left lower lobe seen on recent PET study not well seen on frontal view. It is appreciable on the lateral view measuring 3.3 x 3.1 cm. There is cardiomegaly with a degree of pulmonary vascular congestion interstitial edema, suspicious for a degree of congestive heart failure. Bibasilar atelectasis. No consolidation. Status post coronary artery bypass grafting. Electronically Signed   By: Lowella Grip III M.D.   On: 07/05/2020 10:00   US Venous Img Lower Bilateral  Result Date: 07/05/2020 CLINICAL DATA:  Bilateral leg swelling EXAM: BILATERAL LOWER EXTREMITY VENOUS DOPPLER ULTRASOUND TECHNIQUE: Gray-scale sonography with graded compression, as well as color Doppler and duplex ultrasound were performed to evaluate the lower extremity deep venous systems from the level of the common femoral vein and including the common femoral, femoral, profunda femoral, popliteal and calf veins including the posterior tibial, peroneal and gastrocnemius veins when visible. The superficial great saphenous vein was also interrogated. Spectral Doppler was utilized to evaluate flow at rest and with distal augmentation maneuvers in the common femoral, femoral and popliteal veins. COMPARISON:  None. FINDINGS: RIGHT LOWER EXTREMITY Common Femoral Vein: No evidence of thrombus. Normal compressibility, respiratory phasicity and response to augmentation. Saphenofemoral Junction: No evidence of thrombus. Normal compressibility and flow on color Doppler imaging. Profunda Femoral Vein: No evidence of thrombus. Normal compressibility and flow on color Doppler imaging. Femoral Vein: No evidence of thrombus. Normal compressibility, respiratory phasicity and response to augmentation. Popliteal Vein: No evidence of thrombus. Normal compressibility, respiratory phasicity and response to augmentation. Calf Veins: No evidence of  thrombus. Normal compressibility and flow on color Doppler imaging. Superficial Great Saphenous Vein: No evidence of thrombus. Normal compressibility. Venous Reflux:  None. Other Findings:  None. LEFT LOWER EXTREMITY Common Femoral Vein: No evidence of thrombus. Normal compressibility, respiratory phasicity and response to augmentation. Saphenofemoral Junction: No evidence of thrombus. Normal compressibility and flow on color Doppler imaging. Profunda Femoral Vein: No evidence of thrombus. Normal compressibility and flow on color Doppler imaging. Femoral Vein: No evidence of thrombus. Normal compressibility, respiratory phasicity and response to augmentation. Popliteal Vein: No evidence of thrombus. Normal compressibility, respiratory phasicity and response to augmentation. Calf Veins: No evidence of thrombus. Normal compressibility and flow on color Doppler imaging. Superficial Great Saphenous Vein: No evidence of thrombus. Normal compressibility. Venous Reflux:  None. Other Findings:  None. IMPRESSION: No evidence of deep venous thrombosis in either lower extremity. Electronically Signed   By: Davina Poke D.O.   On: 07/05/2020 10:42    Assessment/Plan Principal Problem:   Acute respiratory failure with hypoxia (HCC) Active Problems:   Type 2 diabetes mellitus with other specified complication (HCC)   Hyperlipidemia   Essential hypertension   Coronary artery disease involving native coronary artery of native heart without angina pectoris   Asthma, moderate persistent   Pulmonary nodule, left   OSA (obstructive sleep apnea)   Iron deficiency anemia due to chronic blood loss     #1 acute respiratory  failure with hypoxemia: Suspected due to CHF exacerbation.  This is new onset.  Patient will be admitted and initiated on diuretics.  Echocardiogram to be done in the morning.  Oxygen initiated.  Supportive care as well.  #2  Coronary artery disease: Status post coronary artery bypass grafting.   Patient is doing well from that point of view.  He has had no issues since his CABG.  He did have cardiac cath apparently about 6 years ago which was normal.  We will get cardiology consult after echo.  He probably will benefit from cardiac stress testing and if abnormal cardiac cath.  #3 chronic iron deficiency anemia: Patient just had iron infusion yesterday.  Hemoglobin is at 9.1.  We will continue to monitor  #4 diabetes: Initiate sliding scale insulin and monitor  #5 essential hypertension: Continue home regimen.  #6 left pulmonary nodule: Follow-up already scheduled outpatient.  #7 left renal mass: Status post biopsy.  Results currently pending  #8 obstructive sleep apnea: CPAP at nights.   DVT prophylaxis: Lovenox Code Status: Full code Family Communication: Wife at bedside Disposition Plan: Home Consults called: None but will need cardiology consult in the morning Admission status: Inpatient  Severity of Illness: The appropriate patient status for this patient is INPATIENT. Inpatient status is judged to be reasonable and necessary in order to provide the required intensity of service to ensure the patient's safety. The patient's presenting symptoms, physical exam findings, and initial radiographic and laboratory data in the context of their chronic comorbidities is felt to place them at high risk for further clinical deterioration. Furthermore, it is not anticipated that the patient will be medically stable for discharge from the hospital within 2 midnights of admission. The following factors support the patient status of inpatient.   " The patient's presenting symptoms include shortness of breath. " The worrisome physical exam findings include bilateral lower extremity edema. " The initial radiographic and laboratory data are worrisome because of evidence of congestive heart failure on chest x-ray. " The chronic co-morbidities include coronary artery disease and diabetes.   *  I certify that at the point of admission it is my clinical judgment that the patient will require inpatient hospital care spanning beyond 2 midnights from the point of admission due to high intensity of service, high risk for further deterioration and high frequency of surveillance required.Barbette Merino MD [facility] Pager [phone]  If 7PM-7AM, please contact night-coverage [email or web address] Password [facility]  07/05/2020, 7:11 PM

## 2020-07-05 NOTE — ED Triage Notes (Signed)
Pt reports he hasn't been able to catch his breath for about 3 days and it has gotten worse today. 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 Pt was in the hospital Tues and Wednesday for a L kidney biopsy. Pt also reports bilateral leg swelling.

## 2020-07-06 ENCOUNTER — Inpatient Hospital Stay (HOSPITAL_COMMUNITY): Payer: BC Managed Care – PPO

## 2020-07-06 ENCOUNTER — Other Ambulatory Visit (HOSPITAL_COMMUNITY): Payer: BC Managed Care – PPO

## 2020-07-06 ENCOUNTER — Encounter (HOSPITAL_COMMUNITY): Payer: Self-pay | Admitting: Internal Medicine

## 2020-07-06 DIAGNOSIS — I5021 Acute systolic (congestive) heart failure: Secondary | ICD-10-CM

## 2020-07-06 LAB — GLUCOSE, CAPILLARY
Glucose-Capillary: 103 mg/dL — ABNORMAL HIGH (ref 70–99)
Glucose-Capillary: 102 mg/dL — ABNORMAL HIGH (ref 70–99)
Glucose-Capillary: 114 mg/dL — ABNORMAL HIGH (ref 70–99)
Glucose-Capillary: 94 mg/dL (ref 70–99)
Glucose-Capillary: 177 mg/dL — ABNORMAL HIGH (ref 70–99)

## 2020-07-06 LAB — BASIC METABOLIC PANEL
Anion gap: 13 (ref 5–15)
BUN: 14 mg/dL (ref 8–23)
CO2: 25 mmol/L (ref 22–32)
Calcium: 8.9 mg/dL (ref 8.9–10.3)
Chloride: 96 mmol/L — ABNORMAL LOW (ref 98–111)
Creatinine, Ser: 0.96 mg/dL (ref 0.61–1.24)
GFR, Estimated: >60 mL/min (ref >60)
Glucose, Bld: 160 mg/dL — ABNORMAL HIGH (ref 70–99)
Potassium: 3.4 mmol/L — ABNORMAL LOW (ref 3.5–5.1)
Sodium: 134 mmol/L — ABNORMAL LOW (ref 135–145)

## 2020-07-06 LAB — ECHOCARDIOGRAM COMPLETE
Area-P 1/2: 5.97 cm2
Height: 71 in
MV M vel: 4.53 m/s
MV Peak grad: 82.1 mmHg
P 1/2 time: 465 msec
S' Lateral: 3.7 cm
Weight: 2737.23 oz

## 2020-07-06 LAB — CK: Total CK: 26 U/L — ABNORMAL LOW (ref 49–397)

## 2020-07-06 MED ORDER — POTASSIUM CHLORIDE CRYS ER 20 MEQ PO TBCR
20.0000 meq | EXTENDED_RELEASE_TABLET | Freq: Two times a day (BID) | ORAL | Status: DC
  Administered 2020-07-06 – 2020-07-11 (×11): 20 meq via ORAL
  Filled 2020-07-06 (×11): qty 1

## 2020-07-06 MED ORDER — ENOXAPARIN SODIUM 40 MG/0.4ML ~~LOC~~ SOLN
40.0000 mg | SUBCUTANEOUS | Status: DC
  Administered 2020-07-06 – 2020-07-10 (×5): 40 mg via SUBCUTANEOUS
  Filled 2020-07-06 (×6): qty 0.4

## 2020-07-06 MED ORDER — ACETAMINOPHEN 325 MG PO TABS
650.0000 mg | ORAL_TABLET | ORAL | Status: DC | PRN
  Administered 2020-07-06 – 2020-07-08 (×2): 650 mg via ORAL
  Filled 2020-07-06 (×2): qty 2

## 2020-07-06 NOTE — Progress Notes (Signed)
[facility] PROGRESS NOTE    [name]  EXB:[id number] DOB: [birth year] DOA: 07/05/2020 PCP: Marin Olp, MD      Brief Narrative:  [name] is a 64 y.o. M with CAD s/p CABG 2000, HTN, DM, chronic IDA on iron infusions, known pulmonary nodule followed by Dr. Elsworth Soho and Dr. Roxan Hockey and renal mass s/p recent renal mass biopsy who presented with few days progressive SOB and pedal edema.  In the ER, BNP >1000, CXR with interstitial edema and cardiomegaly.  Admitted and started on IV Lasix.        Assessment & Plan:  Acute CHF, unknown type Presented with tachypnea, SpO2 normal on room air.  BNP elevated, cardiomegaly and interstitial edema on CXR, dyspnea on exertion, lower leg swelling. Net negative 1.7L overnight.  Cr stable, K slightly low -Furosemide 40 mg IV twice a day  -K supplement -Strict I/Os, daily weights, telemetry  -Daily monitoring renal function -Obtain echocardiogram     Coronary disease secondary prevention Hypertension BP at goal -Continue losartan, amlodipine -Continue Crestor, aspirin -Hold bisoprolol, HCTZ  Diabetes Glucoses controlled overnight -Hold home glimepiride and Ozempic -Continue SS corrections  Iron deficiency anemia Hgb stable relative to recent baseline  Hypofolatemia -Supplement folate  Pulmonary nodule Renal mass -Outpatient follow up              Disposition: Status is: Inpatient  Remains inpatient appropriate because:IV treatments appropriate due to intensity of illness or inability to take PO   Dispo: The patient is from: Home              Anticipated d/c is to: Home              Anticipated d/c date is: 2-3 days              Patient currently is not medically stable to d/c.              MDM: The below labs and imaging reports were reviewed and summarized above.  Medication management as above.  This is a severe acute illness.    DVT prophylaxis: enoxaparin  (LOVENOX) injection 40 mg Start: 07/06/20 1400 SCDs Start: 07/05/20 1243  Code Status: FULL Family Communication: Wife at bedside           Subjective: Patient still has swelling of the legs, but his nocturnal cough is improving, his orthopnea is improving, and his dyspnea seems somewhat better relative to yesterday.  No fever or respiratory distress.       Objective: Vitals:   07/05/20 2352 07/06/20 0405 07/06/20 0648 07/06/20 0843  BP: 138/79 132/75  129/66  Pulse: 76 81  79  Resp: 18 18    Temp: 99.8 F (37.7 C) 99.2 F (37.3 C)  (!) 97.5 F (36.4 C)  TempSrc: Oral Oral  Oral  SpO2: 97% 98%  94%  Weight:   77.6 kg   Height:        Intake/Output Summary (Last 24 hours) at 07/06/2020 1716 Last data filed at 07/06/2020 0931 Gross per 24 hour  Intake --  Output 375 ml  Net -375 ml   Filed Weights   07/05/20 0857 07/05/20 1829 07/06/20 0648  Weight: 84.8 kg 80.3 kg 77.6 kg    Examination: General appearance: Adult male, alert and in no acute distress.  Lying in bed, interactive. HEENT: Anicteric, conjunctiva pink, lids and lashes normal. No nasal deformity, discharge, epistaxis.  Lips moist, dentition in good repair, oropharynx moist,  no oral lesions, hearing normal.   Skin: Warm and dry.  No jaundice.  No suspicious rashes or lesions. Cardiac: RRR, nl S1-S2, S3 noted, no murmur.  Capillary refill is brisk.  JVP elevated above the clavicle.  2+ LE edema to mid shin.  Radial pulses 2+ and symmetric. Respiratory: Normal respiratory rate and rhythm.  CTAB without rales or wheezes. Abdomen: Abdomen soft.  No TTP or guarding. No ascites, distension, hepatosplenomegaly.   MSK: No deformities or effusions. Neuro: Awake and alert.  EOMI, moves all extremities with normal strength and coordination. Speech fluent.    Psych: Sensorium intact and responding to questions, attention normal. Affect pleasant.  Judgment and insight appear normal.    Data Reviewed: I have  personally reviewed following labs and imaging studies:  CBC: Recent Labs  Lab 07/05/20 0912  WBC 13.2*  NEUTROABS 10.7*  HGB 9.1*  HCT 30.4*  MCV 79.4*  PLT 528*   Basic Metabolic Panel: Recent Labs  Lab 07/05/20 0912 07/05/20 0929 07/06/20 0836  NA 134*  --  134*  K 3.6  --  3.4*  CL 97*  --  96*  CO2 24  --  25  GLUCOSE 148*  --  160*  BUN 15  --  14  CREATININE 1.01  --  0.96  CALCIUM 9.0  --  8.9  MG  --  2.1  --   PHOS  --  2.9  --    GFR: Estimated Creatinine Clearance: 81.7 mL/min (by C-G formula based on SCr of 0.96 mg/dL). Liver Function Tests: No results for input(s): AST, ALT, ALKPHOS, BILITOT, PROT, ALBUMIN in the last 168 hours. No results for input(s): LIPASE, AMYLASE in the last 168 hours. No results for input(s): AMMONIA in the last 168 hours. Coagulation Profile: No results for input(s): INR, PROTIME in the last 168 hours. Cardiac Enzymes: Recent Labs  Lab 07/06/20 0836  CKTOTAL 26*   BNP (last 3 results) No results for input(s): PROBNP in the last 8760 hours. HbA1C: No results for input(s): HGBA1C in the last 72 hours. CBG: Recent Labs  Lab 07/05/20 1841 07/05/20 2101 07/06/20 0622 07/06/20 0735 07/06/20 1230  GLUCAP 118* 146* 103* 102* 114*   Lipid Profile: No results for input(s): CHOL, HDL, LDLCALC, TRIG, CHOLHDL, LDLDIRECT in the last 72 hours. Thyroid Function Tests: Recent Labs    07/05/20 1253 07/05/20 1453  TSH 0.369  --   FREET4  --  1.31*   Anemia Panel: Recent Labs    07/05/20 1253 07/05/20 1453  VITAMINB12  --  202  FOLATE  --  4.3*  FERRITIN  --  1,147*  TIBC  --  188*  IRON  --  31*  RETICCTPCT 2.8  --    Urine analysis:    Component Value Date/Time   COLORURINE YELLOW 02/21/2020 2043   APPEARANCEUR CLEAR 02/21/2020 2043   LABSPEC 1.010 02/21/2020 2043   PHURINE 6.0 02/21/2020 2043   GLUCOSEU >=500 (A) 02/21/2020 2043   HGBUR TRACE (A) 02/21/2020 2043   HGBUR negative 09/08/2009 0812    BILIRUBINUR NEGATIVE 02/21/2020 2043   BILIRUBINUR n 11/04/2016 0949   KETONESUR NEGATIVE 02/21/2020 2043   PROTEINUR NEGATIVE 02/21/2020 2043   UROBILINOGEN 0.2 11/04/2016 0949   UROBILINOGEN 0.2 09/08/2009 0812   NITRITE NEGATIVE 02/21/2020 2043   LEUKOCYTESUR NEGATIVE 02/21/2020 2043   Sepsis Labs: @LABRCNTIP (procalcitonin:4,lacticacidven:4)  ) Recent Results (from the past 240 hour(s))  Resp Panel by RT-PCR (Flu A&B, Covid) Nasopharyngeal Swab  Status: None   Collection Time: 07/05/20  9:27 AM   Specimen: Nasopharyngeal Swab; Nasopharyngeal(NP) swabs in vial transport medium  Result Value Ref Range Status   SARS Coronavirus 2 by RT PCR NEGATIVE NEGATIVE Final    Comment: (NOTE) SARS-CoV-2 target nucleic acids are NOT DETECTED.  The SARS-CoV-2 RNA is generally detectable in upper respiratory specimens during the acute phase of infection. The lowest concentration of SARS-CoV-2 viral copies this assay can detect is 138 copies/mL. A negative result does not preclude SARS-Cov-2 infection and should not be used as the sole basis for treatment or other patient management decisions. A negative result may occur with  improper specimen collection/handling, submission of specimen other than nasopharyngeal swab, presence of viral mutation(s) within the areas targeted by this assay, and inadequate number of viral copies(<138 copies/mL). A negative result must be combined with clinical observations, patient history, and epidemiological information. The expected result is Negative.  Fact Sheet for Patients:  EntrepreneurPulse.com.au  Fact Sheet for Healthcare Providers:  IncredibleEmployment.be  This test is no t yet approved or cleared by the [place] FDA and  has been authorized for detection and/or diagnosis of SARS-CoV-2 by FDA under an Emergency Use Authorization (EUA). This EUA will remain  in effect (meaning this test can be used) for  the duration of the COVID-19 declaration under Section 564(b)(1) of the Act, 21 U.S.C.section 360bbb-3(b)(1), unless the authorization is terminated  or revoked sooner.       Influenza A by PCR NEGATIVE NEGATIVE Final   Influenza B by PCR NEGATIVE NEGATIVE Final    Comment: (NOTE) The Xpert Xpress SARS-CoV-2/FLU/RSV plus assay is intended as an aid in the diagnosis of influenza from Nasopharyngeal swab specimens and should not be used as a sole basis for treatment. Nasal washings and aspirates are unacceptable for Xpert Xpress SARS-CoV-2/FLU/RSV testing.  Fact Sheet for Patients: EntrepreneurPulse.com.au  Fact Sheet for Healthcare Providers: IncredibleEmployment.be  This test is not yet approved or cleared by the [place] FDA and has been authorized for detection and/or diagnosis of SARS-CoV-2 by FDA under an Emergency Use Authorization (EUA). This EUA will remain in effect (meaning this test can be used) for the duration of the COVID-19 declaration under Section 564(b)(1) of the Act, 21 U.S.C. section 360bbb-3(b)(1), unless the authorization is terminated or revoked.  Performed at [facility], [address], [place], [place] [address]          Radiology Studies: DG Chest 2 View  Result Date: 07/05/2020 CLINICAL DATA:  Shortness of breath EXAM: CHEST - 2 VIEW COMPARISON:  PET-CT May 20, 2020 FINDINGS: Previously noted nodular lesion in left lower lobe is apparent on the lateral view measuring 3.3 x 3.1 cm. It is difficult to discern on the frontal view. There is cardiomegaly with mild pulmonary venous hypertension. Mild interstitial edema. Atelectasis in the bases. No consolidation appreciable. Status post coronary artery bypass grafting. No appreciable bone lesions. No pneumothorax. IMPRESSION: The nodular opacity in the left lower lobe seen on recent PET study not well seen on frontal view. It is appreciable on the  lateral view measuring 3.3 x 3.1 cm. There is cardiomegaly with a degree of pulmonary vascular congestion interstitial edema, suspicious for a degree of congestive heart failure. Bibasilar atelectasis. No consolidation. Status post coronary artery bypass grafting. Electronically Signed   By: Lowella Grip III M.D.   On: 07/05/2020 10:00   US Venous Img Lower Bilateral  Result Date: 07/05/2020 CLINICAL DATA:  Bilateral  leg swelling EXAM: BILATERAL LOWER EXTREMITY VENOUS DOPPLER ULTRASOUND TECHNIQUE: Gray-scale sonography with graded compression, as well as color Doppler and duplex ultrasound were performed to evaluate the lower extremity deep venous systems from the level of the common femoral vein and including the common femoral, femoral, profunda femoral, popliteal and calf veins including the posterior tibial, peroneal and gastrocnemius veins when visible. The superficial great saphenous vein was also interrogated. Spectral Doppler was utilized to evaluate flow at rest and with distal augmentation maneuvers in the common femoral, femoral and popliteal veins. COMPARISON:  None. FINDINGS: RIGHT LOWER EXTREMITY Common Femoral Vein: No evidence of thrombus. Normal compressibility, respiratory phasicity and response to augmentation. Saphenofemoral Junction: No evidence of thrombus. Normal compressibility and flow on color Doppler imaging. Profunda Femoral Vein: No evidence of thrombus. Normal compressibility and flow on color Doppler imaging. Femoral Vein: No evidence of thrombus. Normal compressibility, respiratory phasicity and response to augmentation. Popliteal Vein: No evidence of thrombus. Normal compressibility, respiratory phasicity and response to augmentation. Calf Veins: No evidence of thrombus. Normal compressibility and flow on color Doppler imaging. Superficial Great Saphenous Vein: No evidence of thrombus. Normal compressibility. Venous Reflux:  None. Other Findings:  None. LEFT LOWER EXTREMITY  Common Femoral Vein: No evidence of thrombus. Normal compressibility, respiratory phasicity and response to augmentation. Saphenofemoral Junction: No evidence of thrombus. Normal compressibility and flow on color Doppler imaging. Profunda Femoral Vein: No evidence of thrombus. Normal compressibility and flow on color Doppler imaging. Femoral Vein: No evidence of thrombus. Normal compressibility, respiratory phasicity and response to augmentation. Popliteal Vein: No evidence of thrombus. Normal compressibility, respiratory phasicity and response to augmentation. Calf Veins: No evidence of thrombus. Normal compressibility and flow on color Doppler imaging. Superficial Great Saphenous Vein: No evidence of thrombus. Normal compressibility. Venous Reflux:  None. Other Findings:  None. IMPRESSION: No evidence of deep venous thrombosis in either lower extremity. Electronically Signed   By: Davina Poke D.O.   On: 07/05/2020 10:42   ECHOCARDIOGRAM COMPLETE  Result Date: 07/06/2020    ECHOCARDIOGRAM REPORT   Patient Name:   PARDEEP PAUTZ Date of Exam: 07/06/2020 Medical Rec #:  [id number]      Height:       71.0 in Accession #:    [id number]     Weight:       171.1 lb Date of Birth:  06/29/1956      BSA:          1.973 m Patient Age:    61 years       BP:           132/75 mmHg Patient Gender: M              HR:           81 bpm. Exam Location:  Inpatient Procedure: 2D Echo, Cardiac Doppler and Color Doppler Indications:    CHF-Acute Systolic 998.33 / A25.05  History:        Patient has no prior history of Echocardiogram examinations.                 CAD; Risk Factors:Hypertension, Diabetes, Dyslipidemia and                 Former Smoker. OSA (obstructive sleep apnea).  Sonographer:    Alvino Chapel RCS Referring Phys: Sandy  1. Left ventricular ejection fraction, by estimation, is 45 to 50%. The left ventricle has mildly decreased function. The left ventricle demonstrates regional wall  motion abnormalities (see scoring diagram/findings for description). There is mild left ventricular hypertrophy. Left ventricular diastolic parameters are consistent with Grade II diastolic dysfunction (pseudonormalization). Elevated left ventricular end-diastolic pressure. There is severe akinesis of the left ventricular, mid-apical inferior wall and apical segment.  2. Right ventricular systolic function is low normal. The right ventricular size is normal. There is moderately elevated pulmonary artery systolic pressure.  3. Left atrial size was moderately dilated.  4. The mitral valve is abnormal. Mild mitral valve regurgitation.  5. The aortic valve is tricuspid. Aortic valve regurgitation is trivial.  6. The inferior vena cava is dilated in size with >50% respiratory variability, suggesting right atrial pressure of 8 mmHg. FINDINGS  Left Ventricle: Left ventricular ejection fraction, by estimation, is 45 to 50%. The left ventricle has mildly decreased function. The left ventricle demonstrates regional wall motion abnormalities. Severe akinesis of the left ventricular, mid-apical inferior wall and apical segment. The left ventricular internal cavity size was normal in size. There is mild left ventricular hypertrophy. Left ventricular diastolic parameters are consistent with Grade II diastolic dysfunction (pseudonormalization). Elevated left ventricular end-diastolic pressure. Right Ventricle: The right ventricular size is normal. No increase in right ventricular wall thickness. Right ventricular systolic function is low normal. There is moderately elevated pulmonary artery systolic pressure. The tricuspid regurgitant velocity  is 3.15 m/s, and with an assumed right atrial pressure of 8 mmHg, the estimated right ventricular systolic pressure is 66.0 mmHg. Left Atrium: Left atrial size was moderately dilated. Right Atrium: Right atrial size was normal in size. Pericardium: There is no evidence of pericardial  effusion. Mitral Valve: The mitral valve is abnormal. There is mild thickening of the mitral valve leaflet(s). Mild mitral valve regurgitation. Tricuspid Valve: The tricuspid valve is grossly normal. Tricuspid valve regurgitation is mild. Aortic Valve: The aortic valve is tricuspid. Aortic valve regurgitation is trivial. Aortic regurgitation PHT measures 465 msec. Pulmonic Valve: The pulmonic valve was normal in structure. Pulmonic valve regurgitation is not visualized. Aorta: The aortic root and ascending aorta are structurally normal, with no evidence of dilitation. Venous: The inferior vena cava is dilated in size with greater than 50% respiratory variability, suggesting right atrial pressure of 8 mmHg. IAS/Shunts: No atrial level shunt detected by color flow Doppler.  LEFT VENTRICLE PLAX 2D LVIDd:         5.00 cm  Diastology LVIDs:         3.70 cm  LV e' medial:    5.22 cm/s LV PW:         1.10 cm  LV E/e' medial:  26.2 LV IVS:        1.10 cm  LV e' lateral:   9.68 cm/s LVOT diam:     2.00 cm  LV E/e' lateral: 14.2 LV SV:         67 LV SV Index:   34 LVOT Area:     3.14 cm  RIGHT VENTRICLE RV S prime:     9.90 cm/s TAPSE (M-mode): 1.5 cm LEFT ATRIUM             Index       RIGHT ATRIUM           Index LA diam:        4.70 cm 2.38 cm/m  RA Area:     17.80 cm LA Vol (A2C):   96.1 ml 48.70 ml/m RA Volume:   46.20 ml  23.41 ml/m LA Vol (A4C):   74.7 ml 37.86  ml/m LA Biplane Vol: 87.6 ml 44.40 ml/m  AORTIC VALVE LVOT Vmax:   118.00 cm/s LVOT Vmean:  82.300 cm/s LVOT VTI:    0.213 m AI PHT:      465 msec  AORTA Ao Root diam: 3.30 cm MITRAL VALVE                TRICUSPID VALVE MV Area (PHT): 5.97 cm     TR Peak grad:   39.7 mmHg MV Decel Time: 127 msec     TR Vmax:        315.00 cm/s MR Peak grad: 82.1 mmHg MR Mean grad: 57.0 mmHg     SHUNTS MR Vmax:      453.00 cm/s   Systemic VTI:  0.21 m MR Vmean:     358.0 cm/s    Systemic Diam: 2.00 cm MV E velocity: 137.00 cm/s MV A velocity: 67.80 cm/s MV E/A ratio:   2.02 Lyman Bishop MD Electronically signed by Lyman Bishop MD Signature Date/Time: 07/06/2020/5:11:10 PM    Final         Scheduled Meds: . amLODipine  5 mg Oral Daily  . aspirin EC  81 mg Oral Daily  . enoxaparin (LOVENOX) injection  40 mg Subcutaneous Q24H  . folic acid  1 mg Oral Daily  . furosemide  40 mg Intravenous BID  . insulin aspart  0-15 Units Subcutaneous TID WC  . insulin aspart  0-5 Units Subcutaneous QHS  . losartan  100 mg Oral Daily  . potassium chloride  20 mEq Oral BID  . rosuvastatin  20 mg Oral Daily  . sodium chloride flush  3 mL Intravenous Q12H   Continuous Infusions: . sodium chloride       LOS: 1 day    Time spent: 35 minutes    Edwin Dada, MD [facility] 07/06/2020, 5:16 PM     Please page though [place] or Epic secure chat:  For [employer], Adult nurse

## 2020-07-06 NOTE — Progress Notes (Signed)
*  PRELIMINARY RESULTS* Echocardiogram 2D Echocardiogram has been performed.  Larry Garner 07/06/2020, 4:55 PM

## 2020-07-06 NOTE — Progress Notes (Signed)
SATURATION QUALIFICATIONS: (This note is used to comply with regulatory documentation for home oxygen)  Patient Saturations on Room Air at Rest = 95%  Patient Saturations on Room Air while Ambulating = 96%  Patient Saturations on n/a Liters of oxygen while Ambulating = 96%  Please briefly explain why patient needs home oxygen: Pt denies shortness of breath or dizziness while ambulating

## 2020-07-07 ENCOUNTER — Encounter: Payer: Self-pay | Admitting: Family Medicine

## 2020-07-07 ENCOUNTER — Telehealth: Payer: Self-pay

## 2020-07-07 DIAGNOSIS — J9601 Acute respiratory failure with hypoxia: Secondary | ICD-10-CM

## 2020-07-07 LAB — BASIC METABOLIC PANEL
Anion gap: 12 (ref 5–15)
BUN: 15 mg/dL (ref 8–23)
CO2: 25 mmol/L (ref 22–32)
Calcium: 8.7 mg/dL — ABNORMAL LOW (ref 8.9–10.3)
Chloride: 98 mmol/L (ref 98–111)
Creatinine, Ser: 0.92 mg/dL (ref 0.61–1.24)
GFR, Estimated: >60 mL/min (ref >60)
Glucose, Bld: 137 mg/dL — ABNORMAL HIGH (ref 70–99)
Potassium: 3.4 mmol/L — ABNORMAL LOW (ref 3.5–5.1)
Sodium: 135 mmol/L (ref 135–145)

## 2020-07-07 LAB — CBC
HCT: 28.1 % — ABNORMAL LOW (ref 39.0–52.0)
Hemoglobin: 8.3 g/dL — ABNORMAL LOW (ref 13.0–17.0)
MCH: 23.2 pg — ABNORMAL LOW (ref 26.0–34.0)
MCHC: 29.5 g/dL — ABNORMAL LOW (ref 30.0–36.0)
MCV: 78.7 fL — ABNORMAL LOW (ref 80.0–100.0)
Platelets: 406 10*3/uL — ABNORMAL HIGH (ref 150–400)
RBC: 3.57 MIL/uL — ABNORMAL LOW (ref 4.22–5.81)
RDW: 20 % — ABNORMAL HIGH (ref 11.5–15.5)
WBC: 10.4 10*3/uL (ref 4.0–10.5)
nRBC: 0 % (ref 0.0–0.2)

## 2020-07-07 LAB — GLUCOSE, CAPILLARY
Glucose-Capillary: 125 mg/dL — ABNORMAL HIGH (ref 70–99)
Glucose-Capillary: 142 mg/dL — ABNORMAL HIGH (ref 70–99)
Glucose-Capillary: 134 mg/dL — ABNORMAL HIGH (ref 70–99)
Glucose-Capillary: 192 mg/dL — ABNORMAL HIGH (ref 70–99)

## 2020-07-07 LAB — HEMOGLOBIN A1C
Hgb A1c MFr Bld: 6.4 % — ABNORMAL HIGH (ref 4.8–5.6)
Mean Plasma Glucose: 137 mg/dL

## 2020-07-07 MED ORDER — SACUBITRIL-VALSARTAN 24-26 MG PO TABS
1.0000 | ORAL_TABLET | Freq: Two times a day (BID) | ORAL | Status: DC
  Administered 2020-07-09 – 2020-07-11 (×5): 1 via ORAL
  Filled 2020-07-07 (×7): qty 1

## 2020-07-07 MED ORDER — ENSURE ENLIVE PO LIQD
237.0000 mL | Freq: Two times a day (BID) | ORAL | Status: DC
  Administered 2020-07-07 – 2020-07-11 (×7): 237 mL via ORAL
  Filled 2020-07-07 (×2): qty 237

## 2020-07-07 MED ORDER — ADULT MULTIVITAMIN W/MINERALS CH
1.0000 | ORAL_TABLET | Freq: Every day | ORAL | Status: DC
  Administered 2020-07-07 – 2020-07-11 (×5): 1 via ORAL
  Filled 2020-07-07 (×5): qty 1

## 2020-07-07 MED ORDER — BISOPROLOL FUMARATE 5 MG PO TABS
10.0000 mg | ORAL_TABLET | Freq: Every day | ORAL | Status: DC
  Administered 2020-07-07 – 2020-07-11 (×5): 10 mg via ORAL
  Filled 2020-07-07 (×5): qty 2

## 2020-07-07 MED ORDER — PNEUMOCOCCAL VAC POLYVALENT 25 MCG/0.5ML IJ INJ
0.5000 mL | INJECTION | INTRAMUSCULAR | Status: AC
  Administered 2020-07-08: 0.5 mL via INTRAMUSCULAR
  Filled 2020-07-07: qty 0.5

## 2020-07-07 NOTE — Progress Notes (Signed)
[facility] PROGRESS NOTE    Larry Garner  GDJ:[id number] DOB: 1955/12/10 DOA: 07/05/2020 PCP: Marin Olp, MD      Brief Narrative:  [name] is a 64 y.o. M with CAD s/p CABG 2000, HTN, DM, chronic IDA on iron infusions, known pulmonary nodule followed by Dr. Elsworth Soho and Dr. Roxan Hockey and renal mass s/p recent renal mass biopsy who presented with few days progressive SOB and pedal edema.  In the ER, BNP >1000, CXR with interstitial edema and cardiomegaly.  Admitted and started on IV Lasix.        Assessment & Plan:  Acute systolic CHF Ischemic cardiomyopathy Presented with tachypnea, SpO2 normal on room air.  Respiratory failure ruled out.  However, BNP elevated, cardiomegaly and interstitial edema on CXR, dyspnea on exertion, lower leg swelling.  Patient has a long history of reduced EF, no history of CHF symptoms in the past.  Net -1.9 L yesterday for total of net -3.3 on admission.  Creatinine stable, potassium normal.  Echo yesterday shows EF 45 to 62%, grade 2 diastolic dysfunction. -Continue IV Lasix -Continue K supplement -Strict I/Os, daily weights, telemetry  -Daily monitoring renal function -Continue bisoprolol, losartan     Ischemic heart disease secondary prevention Hypertension Blood pressure at goal -Continue losartan, amlodipine, bisoprolol  -Continue Crestor, aspirin -Hold HCTZ   Diabetes Glucoses well controlled -Hold home glimepiride and Ozempic -Continue SS corrections  Iron deficiency anemia Hgb stable relative to recent baseline  Hypofolatemia -Supplement folate  Pulmonary nodule Renal mass -Outpatient follow up    B symptoms The patient has been having the symptoms (night sweats, intermittent fevers) for many months.  His fever last night was in keeping with his chronic symptoms, which we are presuming is related to his renal mass.          Disposition: Status is: Inpatient  Remains inpatient  appropriate because:IV treatments appropriate due to intensity of illness or inability to take PO   Dispo: The patient is from: Home              Anticipated d/c is to: Home              Anticipated d/c date is: 1 to 2 days              Patient currently is not medically stable to d/c.     Patient was admitted with CHF flare requiring IV diuresis.  He required oxygen again last night due to nocturnal cough, but today is improved.  He still has significant fluid overload on exam, amenable to IV Lasix.         MDM: The below labs and imaging reports were reviewed and summarized above.  Medication management as above.       DVT prophylaxis: enoxaparin (LOVENOX) injection 40 mg Start: 07/06/20 1400 SCDs Start: 07/05/20 1243  Code Status: FULL Family Communication: Wife at bedside           Subjective: He has some coughing last night, and still has significant swelling in the legs today.  No orthopnea, less dyspnea with exertion.  He had a fever last night, which he did not detect, which is typical for symptoms of osteoporosis.  No sputum, cough, abdominal symptoms.       Objective: Vitals:   07/07/20 0558 07/07/20 1002 07/07/20 1136 07/07/20 1245  BP: 133/67 122/66 127/67   Pulse: 84 87 81   Resp: 20 18 14    Temp: 99.4 F (  37.4 C) 98.8 F (37.1 C) 97.8 F (36.6 C)   TempSrc: Oral Oral    SpO2: 97% 98% 98% 97%  Weight:      Height:        Intake/Output Summary (Last 24 hours) at 07/07/2020 1402 Last data filed at 07/07/2020 1300 Gross per 24 hour  Intake 620 ml  Output 1250 ml  Net -630 ml   Filed Weights   07/05/20 1829 07/06/20 0648 07/07/20 0456  Weight: 80.3 kg 77.6 kg 81.8 kg    Examination: General appearance: Adult male, sitting up in a chair, interactive.     HEENT:    Skin:  Cardiac: RRR, no murmurs, no gallop, JVP not visible today, still 2+ lower extremity edema to the knee. Respiratory: Normal respiratory rate and rhythm, lungs  clear without rales or wheezes Abdomen: Abdomen soft without tenderness palpation or guarding, no ascites or distention. MSK: Normal muscle bulk and tone, no clubbing. Neuro: Awake alert, extraocular movements, moves all extremities with normal strength and coordination, speech fluent Psych: Intact responding questions, attention normal, affect pleasant, judgment insight appear normal.     Data Reviewed: I have personally reviewed following labs and imaging studies:  CBC: Recent Labs  Lab 07/05/20 0912 07/07/20 0129  WBC 13.2* 10.4  NEUTROABS 10.7*  --   HGB 9.1* 8.3*  HCT 30.4* 28.1*  MCV 79.4* 78.7*  PLT 518* 536*   Basic Metabolic Panel: Recent Labs  Lab 07/05/20 0912 07/05/20 0929 07/06/20 0836 07/07/20 0129  NA 134*  --  134* 135  K 3.6  --  3.4* 3.4*  CL 97*  --  96* 98  CO2 24  --  25 25  GLUCOSE 148*  --  160* 137*  BUN 15  --  14 15  CREATININE 1.01  --  0.96 0.92  CALCIUM 9.0  --  8.9 8.7*  MG  --  2.1  --   --   PHOS  --  2.9  --   --    GFR: Estimated Creatinine Clearance: 85.3 mL/min (by C-G formula based on SCr of 0.92 mg/dL). Liver Function Tests: No results for input(s): AST, ALT, ALKPHOS, BILITOT, PROT, ALBUMIN in the last 168 hours. No results for input(s): LIPASE, AMYLASE in the last 168 hours. No results for input(s): AMMONIA in the last 168 hours. Coagulation Profile: No results for input(s): INR, PROTIME in the last 168 hours. Cardiac Enzymes: Recent Labs  Lab 07/06/20 0836  CKTOTAL 26*   BNP (last 3 results) No results for input(s): PROBNP in the last 8760 hours. HbA1C: Recent Labs    07/05/20 0929  HGBA1C 6.4*   CBG: Recent Labs  Lab 07/06/20 1230 07/06/20 1729 07/06/20 2051 07/07/20 0737 07/07/20 1134  GLUCAP 114* 94 177* 125* 142*   Lipid Profile: No results for input(s): CHOL, HDL, LDLCALC, TRIG, CHOLHDL, LDLDIRECT in the last 72 hours. Thyroid Function Tests: Recent Labs    07/05/20 1253 07/05/20 1453  TSH  0.369  --   FREET4  --  1.31*   Anemia Panel: Recent Labs    07/05/20 1253 07/05/20 1453  VITAMINB12  --  202  FOLATE  --  4.3*  FERRITIN  --  1,147*  TIBC  --  188*  IRON  --  31*  RETICCTPCT 2.8  --    Urine analysis:    Component Value Date/Time   COLORURINE YELLOW 02/21/2020 2043   APPEARANCEUR CLEAR 02/21/2020 2043   LABSPEC 1.010 02/21/2020 2043  PHURINE 6.0 02/21/2020 2043   GLUCOSEU >=500 (A) 02/21/2020 2043   HGBUR TRACE (A) 02/21/2020 2043   HGBUR negative 09/08/2009 0812   BILIRUBINUR NEGATIVE 02/21/2020 2043   BILIRUBINUR n 11/04/2016 0949   KETONESUR NEGATIVE 02/21/2020 2043   PROTEINUR NEGATIVE 02/21/2020 2043   UROBILINOGEN 0.2 11/04/2016 0949   UROBILINOGEN 0.2 09/08/2009 0812   NITRITE NEGATIVE 02/21/2020 2043   LEUKOCYTESUR NEGATIVE 02/21/2020 2043   Sepsis Labs: @LABRCNTIP (procalcitonin:4,lacticacidven:4)  ) Recent Results (from the past 240 hour(s))  Resp Panel by RT-PCR (Flu A&B, Covid) Nasopharyngeal Swab     Status: None   Collection Time: 07/05/20  9:27 AM   Specimen: Nasopharyngeal Swab; Nasopharyngeal(NP) swabs in vial transport medium  Result Value Ref Range Status   SARS Coronavirus 2 by RT PCR NEGATIVE NEGATIVE Final    Comment: (NOTE) SARS-CoV-2 target nucleic acids are NOT DETECTED.  The SARS-CoV-2 RNA is generally detectable in upper respiratory specimens during the acute phase of infection. The lowest concentration of SARS-CoV-2 viral copies this assay can detect is 138 copies/mL. A negative result does not preclude SARS-Cov-2 infection and should not be used as the sole basis for treatment or other patient management decisions. A negative result may occur with  improper specimen collection/handling, submission of specimen other than nasopharyngeal swab, presence of viral mutation(s) within the areas targeted by this assay, and inadequate number of viral copies(<138 copies/mL). A negative result must be combined with clinical  observations, patient history, and epidemiological information. The expected result is Negative.  Fact Sheet for Patients:  EntrepreneurPulse.com.au  Fact Sheet for Healthcare Providers:  IncredibleEmployment.be  This test is no t yet approved or cleared by the [place] FDA and  has been authorized for detection and/or diagnosis of SARS-CoV-2 by FDA under an Emergency Use Authorization (EUA). This EUA will remain  in effect (meaning this test can be used) for the duration of the COVID-19 declaration under Section 564(b)(1) of the Act, 21 U.S.C.section 360bbb-3(b)(1), unless the authorization is terminated  or revoked sooner.       Influenza A by PCR NEGATIVE NEGATIVE Final   Influenza B by PCR NEGATIVE NEGATIVE Final    Comment: (NOTE) The Xpert Xpress SARS-CoV-2/FLU/RSV plus assay is intended as an aid in the diagnosis of influenza from Nasopharyngeal swab specimens and should not be used as a sole basis for treatment. Nasal washings and aspirates are unacceptable for Xpert Xpress SARS-CoV-2/FLU/RSV testing.  Fact Sheet for Patients: EntrepreneurPulse.com.au  Fact Sheet for Healthcare Providers: IncredibleEmployment.be  This test is not yet approved or cleared by the [place] FDA and has been authorized for detection and/or diagnosis of SARS-CoV-2 by FDA under an Emergency Use Authorization (EUA). This EUA will remain in effect (meaning this test can be used) for the duration of the COVID-19 declaration under Section 564(b)(1) of the Act, 21 U.S.C. section 360bbb-3(b)(1), unless the authorization is terminated or revoked.  Performed at [facility], [address] [place],  [address]          Radiology Studies: DG CHEST PORT 1 VIEW  Result Date: 07/06/2020 CLINICAL DATA:  Shortness of breath and fever EXAM: PORTABLE CHEST 1 VIEW COMPARISON:  Film from the previous  day FINDINGS: Cardiac shadow is stable. Postsurgical changes are again seen. Aortic calcifications are noted. The known left retro hilar nodular density is partially visualized on today's exam stable in appearance from the prior study. No new focal infiltrate is seen. Mild left basilar atelectasis is noted. Mild  vascular congestion remains without edema. IMPRESSION: Stable left retro hilar nodule. Mild left basilar atelectasis. Electronically Signed   By: Inez Catalina M.D.   On: 07/06/2020 19:42   ECHOCARDIOGRAM COMPLETE  Result Date: 07/06/2020    ECHOCARDIOGRAM REPORT   Patient Name:   Larry Garner Date of Exam: 07/06/2020 Medical Rec #:  [id number]      Height:       71.0 in Accession #:    [id number]     Weight:       171.1 lb Date of Birth:  [birth year]      BSA:          1.973 m Patient Age:    66 years       BP:           132/75 mmHg Patient Gender: M              HR:           81 bpm. Exam Location:  Inpatient Procedure: 2D Echo, Cardiac Doppler and Color Doppler Indications:    CHF-Acute Systolic 335.45 / G25.63  History:        Patient has no prior history of Echocardiogram examinations.                 CAD; Risk Factors:Hypertension, Diabetes, Dyslipidemia and                 Former Smoker. OSA (obstructive sleep apnea).  Sonographer:    Alvino Chapel RCS Referring Phys: [place]  1. Left ventricular ejection fraction, by estimation, is 45 to 50%. The left ventricle has mildly decreased function. The left ventricle demonstrates regional wall motion abnormalities (see scoring diagram/findings for description). There is mild left ventricular hypertrophy. Left ventricular diastolic parameters are consistent with Grade II diastolic dysfunction (pseudonormalization). Elevated left ventricular end-diastolic pressure. There is severe akinesis of the left ventricular, mid-apical inferior wall and apical segment.  2. Right ventricular systolic function is low normal. The right  ventricular size is normal. There is moderately elevated pulmonary artery systolic pressure.  3. Left atrial size was moderately dilated.  4. The mitral valve is abnormal. Mild mitral valve regurgitation.  5. The aortic valve is tricuspid. Aortic valve regurgitation is trivial.  6. The inferior vena cava is dilated in size with >50% respiratory variability, suggesting right atrial pressure of 8 mmHg. FINDINGS  Left Ventricle: Left ventricular ejection fraction, by estimation, is 45 to 50%. The left ventricle has mildly decreased function. The left ventricle demonstrates regional wall motion abnormalities. Severe akinesis of the left ventricular, mid-apical inferior wall and apical segment. The left ventricular internal cavity size was normal in size. There is mild left ventricular hypertrophy. Left ventricular diastolic parameters are consistent with Grade II diastolic dysfunction (pseudonormalization). Elevated left ventricular end-diastolic pressure. Right Ventricle: The right ventricular size is normal. No increase in right ventricular wall thickness. Right ventricular systolic function is low normal. There is moderately elevated pulmonary artery systolic pressure. The tricuspid regurgitant velocity  is 3.15 m/s, and with an assumed right atrial pressure of 8 mmHg, the estimated right ventricular systolic pressure is 89.3 mmHg. Left Atrium: Left atrial size was moderately dilated. Right Atrium: Right atrial size was normal in size. Pericardium: There is no evidence of pericardial effusion. Mitral Valve: The mitral valve is abnormal. There is mild thickening of the mitral valve leaflet(s). Mild mitral valve regurgitation. Tricuspid Valve: The tricuspid valve is grossly normal. Tricuspid valve regurgitation is  mild. Aortic Valve: The aortic valve is tricuspid. Aortic valve regurgitation is trivial. Aortic regurgitation PHT measures 465 msec. Pulmonic Valve: The pulmonic valve was normal in structure. Pulmonic valve  regurgitation is not visualized. Aorta: The aortic root and ascending aorta are structurally normal, with no evidence of dilitation. Venous: The inferior vena cava is dilated in size with greater than 50% respiratory variability, suggesting right atrial pressure of 8 mmHg. IAS/Shunts: No atrial level shunt detected by color flow Doppler.  LEFT VENTRICLE PLAX 2D LVIDd:         5.00 cm  Diastology LVIDs:         3.70 cm  LV e' medial:    5.22 cm/s LV PW:         1.10 cm  LV E/e' medial:  26.2 LV IVS:        1.10 cm  LV e' lateral:   9.68 cm/s LVOT diam:     2.00 cm  LV E/e' lateral: 14.2 LV SV:         67 LV SV Index:   34 LVOT Area:     3.14 cm  RIGHT VENTRICLE RV S prime:     9.90 cm/s TAPSE (M-mode): 1.5 cm LEFT ATRIUM             Index       RIGHT ATRIUM           Index LA diam:        4.70 cm 2.38 cm/m  RA Area:     17.80 cm LA Vol (A2C):   96.1 ml 48.70 ml/m RA Volume:   46.20 ml  23.41 ml/m LA Vol (A4C):   74.7 ml 37.86 ml/m LA Biplane Vol: 87.6 ml 44.40 ml/m  AORTIC VALVE LVOT Vmax:   118.00 cm/s LVOT Vmean:  82.300 cm/s LVOT VTI:    0.213 m AI PHT:      465 msec  AORTA Ao Root diam: 3.30 cm MITRAL VALVE                TRICUSPID VALVE MV Area (PHT): 5.97 cm     TR Peak grad:   39.7 mmHg MV Decel Time: 127 msec     TR Vmax:        315.00 cm/s MR Peak grad: 82.1 mmHg MR Mean grad: 57.0 mmHg     SHUNTS MR Vmax:      453.00 cm/s   Systemic VTI:  0.21 m MR Vmean:     358.0 cm/s    Systemic Diam: 2.00 cm MV E velocity: 137.00 cm/s MV A velocity: 67.80 cm/s MV E/A ratio:  2.02 Lyman Bishop MD Electronically signed by Lyman Bishop MD Signature Date/Time: 07/06/2020/5:11:10 PM    Final         Scheduled Meds: . amLODipine  5 mg Oral Daily  . aspirin EC  81 mg Oral Daily  . bisoprolol  10 mg Oral Daily  . enoxaparin (LOVENOX) injection  40 mg Subcutaneous Q24H  . folic acid  1 mg Oral Daily  . furosemide  40 mg Intravenous BID  . insulin aspart  0-15 Units Subcutaneous TID WC  . insulin aspart   0-5 Units Subcutaneous QHS  . [START ON 07/08/2020] pneumococcal 23 valent vaccine  0.5 mL Intramuscular Tomorrow-1000  . potassium chloride  20 mEq Oral BID  . rosuvastatin  20 mg Oral Daily  . [START ON 07/09/2020] sacubitril-valsartan  1 tablet Oral BID  . sodium chloride flush  3 mL Intravenous Q12H   Continuous Infusions: . sodium chloride       LOS: 2 days    Time spent: 25 minutes    Edwin Dada, MD [facility] 07/07/2020, 2:02 PM     Please page though [place] or Epic secure chat:  For [employer], Adult nurse

## 2020-07-07 NOTE — Telephone Encounter (Signed)
Patient is in hospital   Nurse Assessment Nurse: Raphael Gibney, RN, Vanita Ingles Date/Time Eilene Ghazi Time): 07/05/2020 7:28:48 AM Confirm and document reason for call. If symptomatic, describe symptoms. ---Caller states he has had a biopsy for possible left kidney cancer. he is coughing. He gets SOB when he exerts himself. Hgb is low and iron is low. had iron infusion yesterday. no fever. His inhaler helps a little bit. Does the patient have any new or worsening symptoms? ---Yes Will a triage be completed? ---Yes Related visit to physician within the last 2 weeks? ---No Does the PT have any chronic conditions? (i.e. diabetes, asthma, this includes High risk factors for pregnancy, etc.) ---Yes List chronic conditions. ---asthma; lung tumor; diabetes Is this a behavioral health or substance abuse call? ---No Guidelines Guideline Title Affirmed Question Affirmed Notes Nurse Date/Time (Eastern Time) Asthma Attack [1] MILD asthma attack (e.g., no SOB at rest, mild SOB with walking, speaks normally in sentences, mild wheezing) AND [2] persists > 24 hours on appropriate treatment Raphael Gibney, RN, Vanita Ingles 07/05/2020 7:33:08 AM PLEASE NOTE: All timestamps contained within this report are represented as [place] Standard Time. CONFIDENTIALTY NOTICE: This fax transmission is intended only for the addressee. It contains information that is legally privileged, confidential or otherwise protected from use or disclosure. If you are not the intended recipient, you are strictly prohibited from reviewing, disclosing, copying using or disseminating any of this information or taking any action in reliance on or regarding this information. If you have received this fax in error, please notify us immediately by telephone so that we can arrange for its return to [place]. Phone: [phone], Toll-Free: [phone], Fax: [phone] Page: 2 of 2 Call Id: [id number] [place]. Time Eilene Ghazi Time) Disposition Final User 07/05/2020  7:14:02 AM Send to Urgent Queue Lonia Farber 07/05/2020 7:38:04 AM See PCP within 24 Hours Yes Raphael Gibney, RN, Doreatha Lew Disagree/Comply Comply Caller Understands Yes PreDisposition Home Care Care Advice Given Per Guideline SEE PCP WITHIN 24 HOURS: * IF OFFICE WILL BE OPEN: You need to be examined within the next 24 hours. Call your doctor (or NP/PA) when the office opens and make an appointment. ASTHMA QUICK-RELIEF MEDICINE: * Start your quick-relief medicine (e.g., albuterol, salbutamol) at the first sign of any coughing or shortness of breath (don't wait for wheezing). * Use inhaler (2 puffs each time) or nebulizer every 4 hours. DRINKING FLUIDS AND USING A HUMIDIFIER: * Drink a normal amount of liquids (e.g., water). Being adequately hydrated makes it easier to cough up the sticky lung mucus. CALL BACK IF: * Wheezing is not improved after neb or inhaler * You become worse * Inhaled asthma medicine (neb or MDI) is needed more often than every 4 hours Referrals [facility]

## 2020-07-07 NOTE — Progress Notes (Signed)
Initial Nutrition Assessment  DOCUMENTATION CODES:   Severe malnutrition in context of chronic illness  INTERVENTION:    Ensure Enlive po BID, each supplement provides 350 kcal and 20 grams of protein  MVI daily   NUTRITION DIAGNOSIS:   Severe Malnutrition related to chronic illness (DM with med change, renal mass) as evidenced by moderate muscle depletion, severe muscle depletion, energy intake < or equal to 75% for > or equal to 1 month, percent weight loss.  GOAL:   Patient will meet greater than or equal to 90% of their needs  MONITOR:   PO intake, Supplement acceptance, Weight trends, Labs, I & O's  REASON FOR ASSESSMENT:   Consult Diet education  ASSESSMENT:   Patient with PMH significant for CAD s/p CABG 2000, HTN, DM, chronic IDA, known pulmonary nodule, and renal mass s/p recent biopsy (still awaiting results). Presents this admission with CHF exacerbation.   Pt endorses a loss in appetite over the last 3-4 months after starting new diabetes medication. States during this time he consumed 1-2 meals daily that consist of soups, ice cream, bacon, and other snack items. Endorses having  off/on diarrhea and nausea with vomiting. Appetite this admission has greatly improved. Pt is consuming three meals daily. Last two meal completions charted as 75% and 100%. Discussed the importance of protein intake for preservation of lean body mass given large amount of weight loss. Discussed supplement options. Pt willing to try Ensure.   Briefly discussed sodium intake. Provided examples on ways to decrease sodium intake in diet. Discouraged intake of processed foods and use of salt shaker. Made pt aware that dry weight and lean muscle mass maintenance is top priority.   Pt reports a UBW of 205 lb and an unintentional wt loss of 40 lb in the last 3 months. Records indicate pt weighed 207 lb on 3/16 and 171 lb this admission (17.4% wt loss in 7 months, significant for time frame)  Pt  has noticed muscle loss and had experienced two falls over the last 3 months.   UOP: 1950 ml x 24 hrs   Medications: folic acid, 40 mg lasix BID, SS novolog, 20 mEq KCl BID Labs: K 3.4 (L) CBG 94-177  NUTRITION - FOCUSED PHYSICAL EXAM:    Most Recent Value  Orbital Region Moderate depletion  Upper Arm Region Severe depletion  Thoracic and Lumbar Region Mild depletion  Buccal Region Moderate depletion  Temple Region Severe depletion  Clavicle Bone Region Severe depletion  Clavicle and Acromion Bone Region Moderate depletion  Scapular Bone Region Mild depletion  Dorsal Hand Moderate depletion  Patellar Region Unable to assess  Anterior Thigh Region Unable to assess  Posterior Calf Region Unable to assess  Edema (RD Assessment) Severe  Hair Reviewed  Eyes Reviewed  Mouth Reviewed  Skin Reviewed  Nails Reviewed     Diet Order:   Diet Order            Diet heart healthy/carb modified Room service appropriate? Yes; Fluid consistency: Thin  Diet effective now                 EDUCATION NEEDS:   Education needs have been addressed  Skin:  Skin Assessment: Reviewed RN Assessment  Last BM:  11/29  Height:   Ht Readings from Last 1 Encounters:  07/05/20 5\' 11"  (1.803 m)    Weight:   Wt Readings from Last 1 Encounters:  07/07/20 81.8 kg   BMI:  Body mass index is 25.15  kg/m.  Estimated Nutritional Needs:   Kcal:  2200-2400 kcal  Protein:  110-130 grams  Fluid:  >/= 2 L/day   Mariana Single RD, LDN Clinical Nutrition Pager listed in [place]

## 2020-07-07 NOTE — Plan of Care (Signed)

## 2020-07-07 NOTE — Progress Notes (Signed)
Pt ambulated 22ft on room air maintained O2 saturations at 96%. Pt complained of some discomfort at end of walk but after sitting down and resting pt recovered.

## 2020-07-07 NOTE — Telephone Encounter (Signed)
Patient currently hospitalized.

## 2020-07-07 NOTE — Discharge Instructions (Signed)

## 2020-07-07 NOTE — Consult Note (Addendum)
Cardiology Consultation:   Patient ID: JONATHAN CORPUS MRN: [id number]; DOB: Jul 19, 1956  Admit date: 07/05/2020 Date of Consult: 07/07/2020  Primary Care Provider: Marin Olp, MD [facility] Cardiologist: Jenkins Rouge, MD  [facility] Electrophysiologist:  None    Patient Profile:   Larry Garner is a 64 y.o. male with a hx of CAD s/p PCI and subsequent CABG in 2000, diabetes, GERD, hyperlipidemia, hypertension, sleep apnea on CPAP, chronic anemia on iron infusions, known pulmonary nodules who is being seen today for the evaluation of acute heart failure at the request of Dr. Loleta Books.  History of Present Illness:   Mr. Tolan by Dr. Admission for the above cardiac issues.  He has a history of CAD s/p PCI to LAD with restenosis, subsequently requiring CABG 06/19/1999.  Last catheter 2017 with total occlusion and LAD stent, total occlusion of OM#1/ramus intermedius, 67% first diagonal, total occlusion of the native RCA with LVEF of 45 to 50%.  Commendations at the time are for aggressive risk factor modification given the absence of cardiovascular symptoms.  No CTO recanalization was pursued.  Patient was last seen 04/2020 in the office denied any cardiac symptoms.  He was exercising walking 2 miles a day without anginal symptoms.  He had lots of concerns with renal and pulmonary lesions and weight loss following with primary care.   The patient presented to the ER at [facility] 07/05/2020 for shortness of breath. Patient had a renal mass that was biopsied on 07/01/2020.  After the biopsy he was admitted overnight where he received IVF. Prior to the biopsy he underwent 1 iron transfusion and 1 blood transfusions. Patient reported shortness of breath for the last 3 days and with pedal edema. Pedal edema started the day of his discharge. SOB was worse on exertion. Denied orthopnea and chest pain however did experience some chest tightness. Also has had weight loss (sicne  starting Ozempic in the summer), afternoon fevers, chill, and night sweats. Has been generally tired as well.   In the ED he had low-grade temperature, blood pressure mildly elevated, pulse 80.  He was tach 90% on room air.  BNP found to be elevated to 1,400.  HS troponin 17>15>17. Hemoglobin 9.1. WBC 13.2. Creatinine 1.01, potassium 3.6.  Chest x-ray showed cardiomegaly with pulmonary vascular congestion and interstitial edema nodular opacity left lower lobe.  EKG with no ischemic changes.  Patient was given IV Lasix and admitted for further work-up.   Past Medical History:  Diagnosis Date  . Allergic rhinitis   . Allergy   . Anxiety   . Asthma   . Diabetes ([place])   . Diverticulitis   . GERD (gastroesophageal reflux disease)    very occ  . History of heart attack   . HLD (hyperlipidemia)   . HTN (hypertension)   . Myocardial infarction ([place]) 2000  . Sleep apnea    wears cpap   . Tubular adenoma of colon 09/2008    Past Surgical History:  Procedure Laterality Date  . CARDIAC CATHETERIZATION N/A 07/20/2016   Procedure: Left Heart Cath and Cors/Grafts Angiography;  Surgeon: Belva Crome, MD;  Location: [facility];  Service: Cardiovascular;  Laterality: N/A;  . CATARACT EXTRACTION Bilateral 06/06/2019   Dr. Tommy Rainwater. oct left, dec right 2020   . COLONOSCOPY    . CORONARY ANGIOPLASTY    . CORONARY ARTERY BYPASS GRAFT  06/1999  . POLYPECTOMY    . TONSILLECTOMY     late50's  early 35's     Home Medications:  Prior to Admission medications   Medication Sig Start Date End Date Taking? Authorizing Provider  albuterol (VENTOLIN HFA) 108 (90 Base) MCG/ACT inhaler Inhale 2 puffs into the lungs every 6 (six) hours as needed for wheezing or shortness of breath. 06/17/20  Yes Marin Olp, MD  ALPRAZolam Duanne Moron) 0.5 MG tablet Take 1 tablet (0.5 mg total) by mouth every 6 (six) hours as needed. Patient taking differently: Take 0.5 mg by mouth every 6 (six) hours as needed for  anxiety.  06/23/20  Yes Marin Olp, MD  amLODipine (NORVASC) 5 MG tablet TAKE 1 TABLET BY MOUTH EVERY DAY Patient taking differently: Take 5 mg by mouth daily.  02/21/20  Yes Marin Olp, MD  Azelastine-Fluticasone 137-50 MCG/ACT SUSP Place 1 spray into the nose daily as needed (allergies).    Yes [provider]  bisoprolol-hydrochlorothiazide (ZIAC) 10-6.25 MG tablet Take 1 tablet by mouth daily. 05/22/20  Yes Marin Olp, MD  budesonide-formoterol [facility]) 160-4.5 MCG/ACT inhaler Inhale 2 puffs into the lungs in the morning and at bedtime. Patient taking differently: Inhale 2 puffs into the lungs daily as needed (shortness of breath, wheezing).  06/17/20  Yes Marin Olp, MD  folic acid (FOLVITE) 062 MCG tablet Take 1,600 mcg by mouth daily.     Yes [provider]  glimepiride (AMARYL) 2 MG tablet TAKE 1 TABLET (2 MG TOTAL) BY MOUTH DAILY BEFORE BREAKFAST. Patient taking differently: Take 2 mg by mouth daily with breakfast.  02/08/20  Yes Marin Olp, MD  losartan (COZAAR) 100 MG tablet TAKE 1 TABLET BY MOUTH EVERY DAY Patient taking differently: Take 100 mg by mouth daily.  02/08/20  Yes Marin Olp, MD  Multiple Vitamin (MULTIVITAMIN) tablet Take 1 tablet by mouth daily.     Yes [provider]  nitroGLYCERIN (NITROSTAT) 0.4 MG SL tablet PLACE 1 TABLET UNDER THE TONGUE EVERY 5 MINUTES AS NEEDED FOR CHEST PAIN. Patient taking differently: Place 0.4 mg under the tongue every 5 (five) minutes x 3 doses as needed for chest pain.  07/20/19  Yes Marin Olp, MD  omega-3 acid ethyl esters (LOVAZA) 1 g capsule Take 1 capsule (1 g total) by mouth 3 (three) times daily. Patient taking differently: Take 1 g by mouth 2 (two) times daily.  01/14/20  Yes Marin Olp, MD  oxymetazoline (AFRIN) 0.05 % nasal spray Place 1 spray into both nostrils 2 (two) times daily as needed for congestion.   Yes [provider]  rosuvastatin  (CRESTOR) 20 MG tablet TAKE 1 TABLET BY MOUTH DAILY Patient taking differently: Take 20 mg by mouth daily.  02/13/20  Yes Marin Olp, MD  Semaglutide,0.25 or 0.5MG /DOS, (OZEMPIC, 0.25 OR 0.5 MG/DOSE,) 2 MG/1.5ML SOPN Inject 0.25 mg as directed once a week for 28 days, THEN 0.5 mg once a week for 14 days. Patient taking differently: Inject 0.25 mg subcutaneously once weekly. 06/20/20 08/01/20 Yes Marin Olp, MD  zaleplon (SONATA) 10 MG capsule TAKE 1 CAPSULE (10 MG TOTAL) BY MOUTH AT BEDTIME AS NEEDED. FOR SLEEP Patient taking differently: Take 10 mg by mouth at bedtime as needed for sleep.  03/07/20  Yes Marin Olp, MD  ADVAIR [facility] [phone] MCG/ACT inhaler TAKE 2 PUFFS BY MOUTH TWICE A DAY Patient not taking: Reported on 07/06/2020 06/15/20   Marin Olp, MD  aspirin EC 81 MG tablet Take 1 tablet (81 mg total) by  mouth daily. Patient not taking: Reported on 07/06/2020 07/11/17   Josue Hector, MD  Canagliflozin-metFORMIN HCl (INVOKAMET) [phone] MG TABS Take 1 tablet by mouth 2 (two) times daily. Patient not taking: Reported on 07/06/2020 06/11/20   Marin Olp, MD  doxycycline (VIBRA-TABS) 100 MG tablet Take 1 tablet (100 mg total) by mouth 2 (two) times daily. Patient not taking: Reported on 07/06/2020 06/24/20   Lucretia Kern, DO  famciclovir (FAMVIR) 500 MG tablet TAKE 3 TABLETS BY MOUTH EVERY DAY AS NEEDED AT FIRST SIGN OF OUTBREAK OF FEVER BLISTER Patient not taking: Reported on 07/06/2020 04/25/19   Marin Olp, MD  glucose blood (BAYER CONTOUR NEXT TEST) test strip Use to test blood sugars daily. Dx: E11.9 09/29/18   Marin Olp, MD    Inpatient Medications: Scheduled Meds: . amLODipine  5 mg Oral Daily  . aspirin EC  81 mg Oral Daily  . bisoprolol  10 mg Oral Daily  . enoxaparin (LOVENOX) injection  40 mg Subcutaneous Q24H  . folic acid  1 mg Oral Daily  . furosemide  40 mg Intravenous BID  . insulin aspart  0-15 Units Subcutaneous TID WC  .  insulin aspart  0-5 Units Subcutaneous QHS  . losartan  100 mg Oral Daily  . potassium chloride  20 mEq Oral BID  . rosuvastatin  20 mg Oral Daily  . sodium chloride flush  3 mL Intravenous Q12H   Continuous Infusions: . sodium chloride     PRN Meds: sodium chloride, acetaminophen, albuterol, ALPRAZolam, nitroGLYCERIN, ondansetron (ZOFRAN) IV, sodium chloride flush  Allergies:    Allergies  Allergen Reactions  . Morphine Sulfate Nausea And Vomiting and Swelling    Social History:   Social History   Socioeconomic History  . Marital status: Married    Spouse name: Not on file  . Number of children: Not on file  . Years of education: Not on file  . Highest education level: Not on file  Occupational History  . Occupation: Banker  Tobacco Use  . Smoking status: Former Smoker    Packs/day: 0.50    Years: 4.00    Pack years: 2.00    Types: Cigarettes    Quit date: 09/29/1977    Years since quitting: 42.8  . Smokeless tobacco: Never Used  . Tobacco comment: quit on 1980  Vaping Use  . Vaping Use: Never used  Substance and Sexual Activity  . Alcohol use: Not Currently  . Drug use: No  . Sexual activity: Yes  Other Topics Concern  . Not on file  Social History Narrative   Married  In 1981 with 2 kids (son and daughter). No grandkids.       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 Difficulty of Paying Living Expenses: Not on file  Food Insecurity:   . Worried About Charity fundraiser in the Last Year: Not on file  . Ran Out of Food in the Last Year: Not on file  Transportation Needs:   . Lack of Transportation (Medical): Not on file  . Lack of Transportation (Non-Medical): Not on file  Physical Activity:   . Days of Exercise per Week: Not on file  . Minutes of Exercise per Session: Not on file  Stress:   . Feeling of Stress : Not on file  Social  Connections:   . Frequency of Communication with Friends and Family: Not on file  . Frequency of Social Gatherings with Friends and Family: Not on file  . Attends Religious Services: Not on file  . Active Member of Clubs or Organizations: Not on file  . Attends Archivist Meetings: Not on file  . Marital Status: Not on file  Intimate Partner Violence:   . Fear of Current or Ex-Partner: Not on file  . Emotionally Abused: Not on file  . Physically Abused: Not on file  . Sexually Abused: Not on file    Family History:   Family History  Problem Relation Age of Onset  . Hypertension Mother   . Alzheimer's disease Mother   . Colon polyps Mother   . Hyperlipidemia Father   . Hypertension Father   . Colon polyps Father   . Colon cancer Paternal Aunt   . Pancreatic cancer Neg Hx   . Stomach cancer Neg Hx   . Thyroid disease Neg Hx   . Esophageal cancer Neg Hx   . Rectal cancer Neg Hx      ROS:  Please see the history of present illness.  All other ROS reviewed and negative.     Physical Exam/Data:   Vitals:   07/06/20 2119 07/07/20 0456 07/07/20 0558 07/07/20 1002  BP: 126/65  133/67 122/66  Pulse: 77  84 87  Resp: 16  20 18   Temp: 98.7 F (37.1 C)  99.4 F (37.4 C) 98.8 F (37.1 C)  TempSrc: Oral  Oral Oral  SpO2: 96%  97% 98%  Weight:  81.8 kg    Height:        Intake/Output Summary (Last 24 hours) at 07/07/2020 1123 Last data filed at 07/07/2020 0930 Gross per 24 hour  Intake 380 ml  Output 1900 ml  Net -1520 ml   Last 3 Weights 07/07/2020 07/06/2020 07/05/2020  Weight (lbs) 180 lb 5.4 oz 171 lb 1.2 oz 177 lb 1.6 oz  Weight (kg) 81.8 kg 77.6 kg 80.332 kg     Body mass index is 25.15 kg/m.  General:  Well nourished, well developed, in no acute distress HEENT: normal Lymph: no adenopathy Neck: no JVD Endocrine:  No thryomegaly Vascular: No carotid bruits; FA pulses 2+ bilaterally without bruits  Cardiac:  normal S1, S2; RRR; no murmur  Lungs:   Diffusely diminished Abd: soft, nontender, no hepatomegaly  Ext: 2-3+ b/l edema Musculoskeletal:  No deformities, BUE and BLE strength normal and equal Skin: warm and dry  Neuro:  CNs 2-12 intact, no focal abnormalities noted Psych:  Normal affect   EKG:  The EKG was personally reviewed and demonstrates:  NSR, RBBB, LAD, nonspecific T wave changes, 82bpm Telemetry:  Telemetry was personally reviewed and demonstrates:  NSR HR 80s  Relevant CV Studies:  Echo 07/06/20 1. Left ventricular ejection fraction, by estimation, is 45 to 50%. The  left ventricle has mildly decreased function. The left ventricle  demonstrates regional wall motion abnormalities (see scoring  diagram/findings for description). There is mild left  ventricular hypertrophy. Left ventricular diastolic parameters are  consistent with Grade II diastolic dysfunction (pseudonormalization).  Elevated left ventricular end-diastolic pressure. There is severe akinesis  of the left ventricular, mid-apical  inferior wall and apical segment.  2. Right ventricular systolic function is low normal. The right  ventricular size is normal. There is moderately elevated pulmonary artery  systolic pressure.  3. Left atrial size was moderately dilated.  4. The mitral  valve is abnormal. Mild mitral valve regurgitation.  5. The aortic valve is tricuspid. Aortic valve regurgitation is trivial.  6. The inferior vena cava is dilated in size with >50% respiratory  variability, suggesting right atrial pressure of 8 mmHg.   Cardiac cath 07/2016  The left ventricular systolic function is normal.  The left ventricular ejection fraction is 45-50% by visual estimate.  There is no mitral valve regurgitation.  Ost 1st Diag to 1st Diag lesion, 65 %stenosed.  Mid LAD lesion, 100 %stenosed.  SVG and is normal in caliber.  SVG.  LIMA.  Dist LAD lesion, 50 %stenosed.  Ost 2nd Diag to 2nd Diag lesion, 100 %stenosed.  Ramus lesion,  100 %stenosed.  Mid RCA lesion, 100 %stenosed.  Prox Graft to Dist Graft lesion, 20 %stenosed.  Origin lesion, 40 %stenosed.    Widely patent previously placed bypass grafts including LIMA to mid LAD, saphenous graft to diagonal, and saphenous vein graft to obtuse marginal #1/ramus intermedius.  Total occlusion of the proximal LAD in stent.  Total occlusion of the obtuse marginal #1/ramus intermedius  60-70% stenosis in the native first diagonal  Total occlusion of the native mid right coronary collateralized by both right to right and left-to-right collaterals. The right coronary is dominant.  Normal left ventricular hemodynamics. LVEF 45-50 %.  Recommendations:   Continue aggressive risk factor modification.  In absence of cardiac symptoms, medical therapy seems most appropriate. Therefore, CTO recanalization will not be pursued..  Coronary Diagrams  Diagnostic Dominance: Right      Laboratory Data:  High Sensitivity Troponin:   Recent Labs  Lab 07/05/20 0929 07/05/20 1148 07/05/20 1446  TROPONINIHS 17 15 17      Chemistry Recent Labs  Lab 07/05/20 0912 07/06/20 0836 07/07/20 0129  NA 134* 134* 135  K 3.6 3.4* 3.4*  CL 97* 96* 98  CO2 24 25 25   GLUCOSE 148* 160* 137*  BUN 15 14 15   CREATININE 1.01 0.96 0.92  CALCIUM 9.0 8.9 8.7*  GFRNONAA >60 >60 >60  ANIONGAP 13 13 12     No results for input(s): PROT, ALBUMIN, AST, ALT, ALKPHOS, BILITOT in the last 168 hours. Hematology Recent Labs  Lab 07/05/20 0912 07/05/20 1253 07/07/20 0129  WBC 13.2*  --  10.4  RBC 3.83* 3.62* 3.57*  HGB 9.1*  --  8.3*  HCT 30.4*  --  28.1*  MCV 79.4*  --  78.7*  MCH 23.8*  --  23.2*  MCHC 29.9*  --  29.5*  RDW 19.8*  --  20.0*  PLT 518*  --  406*   BNP Recent Labs  Lab 07/05/20 0912  BNP 1,435.5*    DDimer No results for input(s): DDIMER in the last 168 hours.   Radiology/Studies:  DG Chest 2 View  Result Date: 07/05/2020 CLINICAL DATA:   Shortness of breath EXAM: CHEST - 2 VIEW COMPARISON:  PET-CT May 20, 2020 FINDINGS: Previously noted nodular lesion in left lower lobe is apparent on the lateral view measuring 3.3 x 3.1 cm. It is difficult to discern on the frontal view. There is cardiomegaly with mild pulmonary venous hypertension. Mild interstitial edema. Atelectasis in the bases. No consolidation appreciable. Status post coronary artery bypass grafting. No appreciable bone lesions. No pneumothorax. IMPRESSION: The nodular opacity in the left lower lobe seen on recent PET study not well seen on frontal view. It is appreciable on the lateral view measuring 3.3 x 3.1 cm. There is cardiomegaly with a degree of pulmonary vascular congestion interstitial  edema, suspicious for a degree of congestive heart failure. Bibasilar atelectasis. No consolidation. Status post coronary artery bypass grafting. Electronically Signed   By: Lowella Grip III M.D.   On: 07/05/2020 10:00   US Venous Img Lower Bilateral  Result Date: 07/05/2020 CLINICAL DATA:  Bilateral leg swelling EXAM: BILATERAL LOWER EXTREMITY VENOUS DOPPLER ULTRASOUND TECHNIQUE: Gray-scale sonography with graded compression, as well as color Doppler and duplex ultrasound were performed to evaluate the lower extremity deep venous systems from the level of the common femoral vein and including the common femoral, femoral, profunda femoral, popliteal and calf veins including the posterior tibial, peroneal and gastrocnemius veins when visible. The superficial great saphenous vein was also interrogated. Spectral Doppler was utilized to evaluate flow at rest and with distal augmentation maneuvers in the common femoral, femoral and popliteal veins. COMPARISON:  None. FINDINGS: RIGHT LOWER EXTREMITY Common Femoral Vein: No evidence of thrombus. Normal compressibility, respiratory phasicity and response to augmentation. Saphenofemoral Junction: No evidence of thrombus. Normal compressibility  and flow on color Doppler imaging. Profunda Femoral Vein: No evidence of thrombus. Normal compressibility and flow on color Doppler imaging. Femoral Vein: No evidence of thrombus. Normal compressibility, respiratory phasicity and response to augmentation. Popliteal Vein: No evidence of thrombus. Normal compressibility, respiratory phasicity and response to augmentation. Calf Veins: No evidence of thrombus. Normal compressibility and flow on color Doppler imaging. Superficial Great Saphenous Vein: No evidence of thrombus. Normal compressibility. Venous Reflux:  None. Other Findings:  None. LEFT LOWER EXTREMITY Common Femoral Vein: No evidence of thrombus. Normal compressibility, respiratory phasicity and response to augmentation. Saphenofemoral Junction: No evidence of thrombus. Normal compressibility and flow on color Doppler imaging. Profunda Femoral Vein: No evidence of thrombus. Normal compressibility and flow on color Doppler imaging. Femoral Vein: No evidence of thrombus. Normal compressibility, respiratory phasicity and response to augmentation. Popliteal Vein: No evidence of thrombus. Normal compressibility, respiratory phasicity and response to augmentation. Calf Veins: No evidence of thrombus. Normal compressibility and flow on color Doppler imaging. Superficial Great Saphenous Vein: No evidence of thrombus. Normal compressibility. Venous Reflux:  None. Other Findings:  None. IMPRESSION: No evidence of deep venous thrombosis in either lower extremity. Electronically Signed   By: Davina Poke D.O.   On: 07/05/2020 10:42   DG CHEST PORT 1 VIEW  Result Date: 07/06/2020 CLINICAL DATA:  Shortness of breath and fever EXAM: PORTABLE CHEST 1 VIEW COMPARISON:  Film from the previous day FINDINGS: Cardiac shadow is stable. Postsurgical changes are again seen. Aortic calcifications are noted. The known left retro hilar nodular density is partially visualized on today's exam stable in appearance from the prior  study. No new focal infiltrate is seen. Mild left basilar atelectasis is noted. Mild vascular congestion remains without edema. IMPRESSION: Stable left retro hilar nodule. Mild left basilar atelectasis. Electronically Signed   By: Inez Catalina M.D.   On: 07/06/2020 19:42   ECHOCARDIOGRAM COMPLETE  Result Date: 07/06/2020    ECHOCARDIOGRAM REPORT   Patient Name:   Larry Garner Date of Exam: 07/06/2020 Medical Rec #:  [id number]      Height:       71.0 in Accession #:    [id number]     Weight:       171.1 lb Date of Birth:  [birth year]      BSA:          1.973 m Patient Age:    46 years       BP:  132/75 mmHg Patient Gender: M              HR:           81 bpm. Exam Location:  Inpatient Procedure: 2D Echo, Cardiac Doppler and Color Doppler Indications:    CHF-Acute Systolic 557.32 / K02.54  History:        Patient has no prior history of Echocardiogram examinations.                 CAD; Risk Factors:Hypertension, Diabetes, Dyslipidemia and                 Former Smoker. OSA (obstructive sleep apnea).  Sonographer:    Alvino Chapel RCS Referring Phys: [place]  1. Left ventricular ejection fraction, by estimation, is 45 to 50%. The left ventricle has mildly decreased function. The left ventricle demonstrates regional wall motion abnormalities (see scoring diagram/findings for description). There is mild left ventricular hypertrophy. Left ventricular diastolic parameters are consistent with Grade II diastolic dysfunction (pseudonormalization). Elevated left ventricular end-diastolic pressure. There is severe akinesis of the left ventricular, mid-apical inferior wall and apical segment.  2. Right ventricular systolic function is low normal. The right ventricular size is normal. There is moderately elevated pulmonary artery systolic pressure.  3. Left atrial size was moderately dilated.  4. The mitral valve is abnormal. Mild mitral valve regurgitation.  5. The aortic valve is  tricuspid. Aortic valve regurgitation is trivial.  6. The inferior vena cava is dilated in size with >50% respiratory variability, suggesting right atrial pressure of 8 mmHg. FINDINGS  Left Ventricle: Left ventricular ejection fraction, by estimation, is 45 to 50%. The left ventricle has mildly decreased function. The left ventricle demonstrates regional wall motion abnormalities. Severe akinesis of the left ventricular, mid-apical inferior wall and apical segment. The left ventricular internal cavity size was normal in size. There is mild left ventricular hypertrophy. Left ventricular diastolic parameters are consistent with Grade II diastolic dysfunction (pseudonormalization). Elevated left ventricular end-diastolic pressure. Right Ventricle: The right ventricular size is normal. No increase in right ventricular wall thickness. Right ventricular systolic function is low normal. There is moderately elevated pulmonary artery systolic pressure. The tricuspid regurgitant velocity  is 3.15 m/s, and with an assumed right atrial pressure of 8 mmHg, the estimated right ventricular systolic pressure is 27.0 mmHg. Left Atrium: Left atrial size was moderately dilated. Right Atrium: Right atrial size was normal in size. Pericardium: There is no evidence of pericardial effusion. Mitral Valve: The mitral valve is abnormal. There is mild thickening of the mitral valve leaflet(s). Mild mitral valve regurgitation. Tricuspid Valve: The tricuspid valve is grossly normal. Tricuspid valve regurgitation is mild. Aortic Valve: The aortic valve is tricuspid. Aortic valve regurgitation is trivial. Aortic regurgitation PHT measures 465 msec. Pulmonic Valve: The pulmonic valve was normal in structure. Pulmonic valve regurgitation is not visualized. Aorta: The aortic root and ascending aorta are structurally normal, with no evidence of dilitation. Venous: The inferior vena cava is dilated in size with greater than 50% respiratory  variability, suggesting right atrial pressure of 8 mmHg. IAS/Shunts: No atrial level shunt detected by color flow Doppler.  LEFT VENTRICLE PLAX 2D LVIDd:         5.00 cm  Diastology LVIDs:         3.70 cm  LV e' medial:    5.22 cm/s LV PW:         1.10 cm  LV E/e' medial:  26.2 LV  IVS:        1.10 cm  LV e' lateral:   9.68 cm/s LVOT diam:     2.00 cm  LV E/e' lateral: 14.2 LV SV:         67 LV SV Index:   34 LVOT Area:     3.14 cm  RIGHT VENTRICLE RV S prime:     9.90 cm/s TAPSE (M-mode): 1.5 cm LEFT ATRIUM             Index       RIGHT ATRIUM           Index LA diam:        4.70 cm 2.38 cm/m  RA Area:     17.80 cm LA Vol (A2C):   96.1 ml 48.70 ml/m RA Volume:   46.20 ml  23.41 ml/m LA Vol (A4C):   74.7 ml 37.86 ml/m LA Biplane Vol: 87.6 ml 44.40 ml/m  AORTIC VALVE LVOT Vmax:   118.00 cm/s LVOT Vmean:  82.300 cm/s LVOT VTI:    0.213 m AI PHT:      465 msec  AORTA Ao Root diam: 3.30 cm MITRAL VALVE                TRICUSPID VALVE MV Area (PHT): 5.97 cm     TR Peak grad:   39.7 mmHg MV Decel Time: 127 msec     TR Vmax:        315.00 cm/s MR Peak grad: 82.1 mmHg MR Mean grad: 57.0 mmHg     SHUNTS MR Vmax:      453.00 cm/s   Systemic VTI:  0.21 m MR Vmean:     358.0 cm/s    Systemic Diam: 2.00 cm MV E velocity: 137.00 cm/s MV A velocity: 67.80 cm/s MV E/A ratio:  2.02 Lyman Bishop MD Electronically signed by Lyman Bishop MD Signature Date/Time: 07/06/2020/5:11:10 PM    Final      Assessment and Plan:   Acute respiratory failure/acute combined CHF -PTA patient had multiple iron infusions, blood transfusion and IVF during biopsy. Patient presented with 3 days of progressive shortness of breath.  BNP elevated 1400.  Chest X-ray showed acute CHF -IV Lasix 40 mg twice daily -Put out 1.9 L overnight, net -3.3 L -Weight 187lbs> 180 lbs -Echo this admission showed EF of 45 to 50% (unchanged from cath in 2017), wall motion abnormalities, mild LVH, grade 2 diastolic dysfunction, low normal RV function,  moderately elevated pulmonary artery pressure, moderately dilated left atrium, mild MR, trivial AI.  -Creatinine stable -continue diuresis -strict I/O's, daily weights, monitor creatinine with diuresis  CAD s/p PCI to LAD and subsequent CABG - last cath in 2017.  Can consider CTO of RCA if refractory angina -He denies anginal symptoms -Continue aspirin, bisoprolol, losartan. Will discuss entresto with MD  Hyperlipidemia -Continue statin and Lovaza  - LDL 34 02/21/20  Hypertension -Continue amlodipine 5 mg daily, bisoprolol 10 mg daily, losartan 100 mg daily - pressures stable  Diabetes type 2 -SSI - A1C 6.4  Iron deficiency anemia -Hemoglobin 9.1>8.3.  - In July Hgb was 14 - PTA s/p multiple iron infusions and blood transfusion  For questions or updates, please contact [place] Please consult [email or web address] for contact info under    Signed, Cadence Ninfa Meeker, PA-C  07/07/2020 11:23 AM   Patient seen and examined and agree with Cadence Kathlen Mody, PA-C as detailed above.  In brief, the patient is a 64 y.o. 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  Suspect patient's acute decompensation occurred in the setting of receiving IVF for his renal biopsy as well as administration of blood products and IV iron. TTE here with LVEF 45-50% with severe akinesis  of the left ventricular, mid-apical, inferior wall and apical segment and grade II diastolic dysfunction which matches known disease on cath in 2017 where EF was estimated to be about the same. Trop here flat. BNP elevated at 1400.  Symptomatically improving with IV diuresis.  Exam: GEN: No acute distress.   Neck: No JVD Cardiac: RRR, no murmurs, rubs, or gallops.  Respiratory: Diminished at bases. GI: Soft, nontender, non-distended  MS: 1+ pitting edema to mid-shins Neuro:  Nonfocal  Psych: Normal affect   Plan: -Continue IV diuresis with lasix 40mg  IV BID; will likely need maintenance diuretic or diuretic to take as needed at home especially if receiving IVF/blood -Continue ASA, bisoprolol, statin -Change to ARB to entresto -Continue amlodipine 5mg  daily -Continue work-up for underlying renal mass  Total time of encounter: 35 minutes total time of encounter, including 25 minutes spent in face-to-face patient care on the date of this encounter. This time includes coordination of care and counseling regarding above mentioned problem list. Remainder of non-face-to-face time involved reviewing chart documents/testing relevant to the patient encounter and documentation in the medical record. I have independently reviewed documentation from referring provider.   Gwyndolyn Kaufman, MD [place], MD

## 2020-07-08 DIAGNOSIS — E43 Unspecified severe protein-calorie malnutrition: Secondary | ICD-10-CM | POA: Insufficient documentation

## 2020-07-08 LAB — CBC
HCT: 28.3 % — ABNORMAL LOW (ref 39.0–52.0)
Hemoglobin: 8.2 g/dL — ABNORMAL LOW (ref 13.0–17.0)
MCH: 22.9 pg — ABNORMAL LOW (ref 26.0–34.0)
MCHC: 29 g/dL — ABNORMAL LOW (ref 30.0–36.0)
MCV: 79.1 fL — ABNORMAL LOW (ref 80.0–100.0)
Platelets: 386 10*3/uL (ref 150–400)
RBC: 3.58 MIL/uL — ABNORMAL LOW (ref 4.22–5.81)
RDW: 20.5 % — ABNORMAL HIGH (ref 11.5–15.5)
WBC: 10.4 10*3/uL (ref 4.0–10.5)
nRBC: 0 % (ref 0.0–0.2)

## 2020-07-08 LAB — BASIC METABOLIC PANEL
Anion gap: 14 (ref 5–15)
BUN: 17 mg/dL (ref 8–23)
CO2: 24 mmol/L (ref 22–32)
Calcium: 9 mg/dL (ref 8.9–10.3)
Chloride: 96 mmol/L — ABNORMAL LOW (ref 98–111)
Creatinine, Ser: 1.04 mg/dL (ref 0.61–1.24)
GFR, Estimated: >60 mL/min (ref >60)
Glucose, Bld: 143 mg/dL — ABNORMAL HIGH (ref 70–99)
Potassium: 3.8 mmol/L (ref 3.5–5.1)
Sodium: 134 mmol/L — ABNORMAL LOW (ref 135–145)

## 2020-07-08 LAB — GLUCOSE, CAPILLARY
Glucose-Capillary: 135 mg/dL — ABNORMAL HIGH (ref 70–99)
Glucose-Capillary: 170 mg/dL — ABNORMAL HIGH (ref 70–99)
Glucose-Capillary: 134 mg/dL — ABNORMAL HIGH (ref 70–99)

## 2020-07-08 MED ORDER — CANAGLIFLOZIN 100 MG PO TABS
100.0000 mg | ORAL_TABLET | Freq: Every day | ORAL | Status: DC
  Administered 2020-07-09 – 2020-07-11 (×3): 100 mg via ORAL
  Filled 2020-07-08 (×3): qty 1

## 2020-07-08 MED ORDER — FUROSEMIDE 10 MG/ML IJ SOLN: 80.0000 mg | Freq: Two times a day (BID) | INTRAMUSCULAR | Status: DC

## 2020-07-08 MED ORDER — ZOLPIDEM TARTRATE 5 MG PO TABS: 5.0000 mg | ORAL_TABLET | Freq: Every evening | ORAL | Status: DC | PRN

## 2020-07-08 MED ORDER — FUROSEMIDE 10 MG/ML IJ SOLN
80.0000 mg | Freq: Two times a day (BID) | INTRAMUSCULAR | Status: DC
  Administered 2020-07-08 – 2020-07-09 (×4): 80 mg via INTRAVENOUS
  Filled 2020-07-08 (×3): qty 8

## 2020-07-08 MED ORDER — FUROSEMIDE 10 MG/ML IJ SOLN
60.0000 mg | Freq: Two times a day (BID) | INTRAMUSCULAR | Status: DC
  Filled 2020-07-08: qty 6

## 2020-07-08 NOTE — Progress Notes (Addendum)
Progress Note  Patient Name: Larry Garner Date of Encounter: 07/08/2020  [facility] Cardiologist: Jenkins Rouge, MD   Subjective   Patient feels better. Not sleeping well but breathing improved. Continues to have L>R LE edema but overall better from prior.   Net negative 510, Cr 1.04 Wt 81.8-->81.5  Inpatient Medications    Scheduled Meds: . amLODipine  5 mg Oral Daily  . aspirin EC  81 mg Oral Daily  . bisoprolol  10 mg Oral Daily  . enoxaparin (LOVENOX) injection  40 mg Subcutaneous Q24H  . feeding supplement  237 mL Oral BID BM  . folic acid  1 mg Oral Daily  . furosemide  40 mg Intravenous BID  . insulin aspart  0-15 Units Subcutaneous TID WC  . insulin aspart  0-5 Units Subcutaneous QHS  . multivitamin with minerals  1 tablet Oral Daily  . pneumococcal 23 valent vaccine  0.5 mL Intramuscular Tomorrow-1000  . potassium chloride  20 mEq Oral BID  . rosuvastatin  20 mg Oral Daily  . [START ON 07/09/2020] sacubitril-valsartan  1 tablet Oral BID  . sodium chloride flush  3 mL Intravenous Q12H   Continuous Infusions: . sodium chloride     PRN Meds: sodium chloride, acetaminophen, albuterol, ALPRAZolam, nitroGLYCERIN, ondansetron (ZOFRAN) IV, sodium chloride flush   Vital Signs    Vitals:   07/07/20 1136 07/07/20 1245 07/07/20 2144 07/08/20 0523  BP: 127/67  130/77 120/67  Pulse: 81  82 78  Resp: 14  18 18   Temp: 97.8 F (36.6 C)  98.9 F (37.2 C) 98.2 F (36.8 C)  TempSrc:   Oral Oral  SpO2: 98% 97% 98% 93%  Weight:    81.5 kg  Height:        Intake/Output Summary (Last 24 hours) at 07/08/2020 0734 Last data filed at 07/08/2020 4098 Gross per 24 hour  Intake 840 ml  Output 1350 ml  Net -510 ml   Last 3 Weights 07/08/2020 07/07/2020 07/06/2020  Weight (lbs) 179 lb 10.8 oz 180 lb 5.4 oz 171 lb 1.2 oz  Weight (kg) 81.5 kg 81.8 kg 77.6 kg      Telemetry    NSR - Personally Reviewed  ECG    No new ECG - Personally Reviewed  Physical Exam     GEN: No acute distress.   Neck: +JVD Cardiac: RRR, no murmurs, rubs, or gallops.  Respiratory: Clear to auscultation bilaterally. GI: Soft, nontender, non-distended  MS: 2+ pitting edema to mid-shin (L>R) Neuro:  Nonfocal  Psych: Normal affect   Labs    High Sensitivity Troponin:   Recent Labs  Lab 07/05/20 0929 07/05/20 1148 07/05/20 1446  TROPONINIHS 17 15 17       Chemistry Recent Labs  Lab 07/06/20 0836 07/07/20 0129 07/08/20 0443  NA 134* 135 134*  K 3.4* 3.4* 3.8  CL 96* 98 96*  CO2 25 25 24   GLUCOSE 160* 137* 143*  BUN 14 15 17   CREATININE 0.96 0.92 1.04  CALCIUM 8.9 8.7* 9.0  GFRNONAA >60 >60 >60  ANIONGAP 13 12 14      Hematology Recent Labs  Lab 07/05/20 0912 07/05/20 1253 07/07/20 0129 07/08/20 0443  WBC 13.2*  --  10.4 10.4  RBC 3.83* 3.62* 3.57* 3.58*  HGB 9.1*  --  8.3* 8.2*  HCT 30.4*  --  28.1* 28.3*  MCV 79.4*  --  78.7* 79.1*  MCH 23.8*  --  23.2* 22.9*  MCHC 29.9*  --  29.5*  29.0*  RDW 19.8*  --  20.0* 20.5*  PLT 518*  --  406* 386    BNP Recent Labs  Lab 07/05/20 0912  BNP 1,435.5*     DDimer No results for input(s): DDIMER in the last 168 hours.   Radiology    DG CHEST PORT 1 VIEW  Result Date: 07/06/2020 CLINICAL DATA:  Shortness of breath and fever EXAM: PORTABLE CHEST 1 VIEW COMPARISON:  Film from the previous day FINDINGS: Cardiac shadow is stable. Postsurgical changes are again seen. Aortic calcifications are noted. The known left retro hilar nodular density is partially visualized on today's exam stable in appearance from the prior study. No new focal infiltrate is seen. Mild left basilar atelectasis is noted. Mild vascular congestion remains without edema. IMPRESSION: Stable left retro hilar nodule. Mild left basilar atelectasis. Electronically Signed   By: Inez Catalina M.D.   On: 07/06/2020 19:42   ECHOCARDIOGRAM COMPLETE  Result Date: 07/06/2020    ECHOCARDIOGRAM REPORT   Patient Name:   Larry Garner Date of  Exam: 07/06/2020 Medical Rec #:  [id number]      Height:       71.0 in Accession #:    [id number]     Weight:       171.1 lb Date of Birth:  Sep 25, 1955      BSA:          1.973 m Patient Age:    64 years       BP:           132/75 mmHg Patient Gender: M              HR:           81 bpm. Exam Location:  Inpatient Procedure: 2D Echo, Cardiac Doppler and Color Doppler Indications:    CHF-Acute Systolic 494.49 / Q75.91  History:        Patient has no prior history of Echocardiogram examinations.                 CAD; Risk Factors:Hypertension, Diabetes, Dyslipidemia and                 Former Smoker. OSA (obstructive sleep apnea).  Sonographer:    Alvino Chapel RCS Referring Phys: [place]  1. Left ventricular ejection fraction, by estimation, is 45 to 50%. The left ventricle has mildly decreased function. The left ventricle demonstrates regional wall motion abnormalities (see scoring diagram/findings for description). There is mild left ventricular hypertrophy. Left ventricular diastolic parameters are consistent with Grade II diastolic dysfunction (pseudonormalization). Elevated left ventricular end-diastolic pressure. There is severe akinesis of the left ventricular, mid-apical inferior wall and apical segment.  2. Right ventricular systolic function is low normal. The right ventricular size is normal. There is moderately elevated pulmonary artery systolic pressure.  3. Left atrial size was moderately dilated.  4. The mitral valve is abnormal. Mild mitral valve regurgitation.  5. The aortic valve is tricuspid. Aortic valve regurgitation is trivial.  6. The inferior vena cava is dilated in size with >50% respiratory variability, suggesting right atrial pressure of 8 mmHg. FINDINGS  Left Ventricle: Left ventricular ejection fraction, by estimation, is 45 to 50%. The left ventricle has mildly decreased function. The left ventricle demonstrates regional wall motion abnormalities. Severe akinesis of  the left ventricular, mid-apical inferior wall and apical segment. The left ventricular internal cavity size was normal in size. There is mild left ventricular hypertrophy. Left ventricular diastolic  parameters are consistent with Grade II diastolic dysfunction (pseudonormalization). Elevated left ventricular end-diastolic pressure. Right Ventricle: The right ventricular size is normal. No increase in right ventricular wall thickness. Right ventricular systolic function is low normal. There is moderately elevated pulmonary artery systolic pressure. The tricuspid regurgitant velocity  is 3.15 m/s, and with an assumed right atrial pressure of 8 mmHg, the estimated right ventricular systolic pressure is 50.3 mmHg. Left Atrium: Left atrial size was moderately dilated. Right Atrium: Right atrial size was normal in size. Pericardium: There is no evidence of pericardial effusion. Mitral Valve: The mitral valve is abnormal. There is mild thickening of the mitral valve leaflet(s). Mild mitral valve regurgitation. Tricuspid Valve: The tricuspid valve is grossly normal. Tricuspid valve regurgitation is mild. Aortic Valve: The aortic valve is tricuspid. Aortic valve regurgitation is trivial. Aortic regurgitation PHT measures 465 msec. Pulmonic Valve: The pulmonic valve was normal in structure. Pulmonic valve regurgitation is not visualized. Aorta: The aortic root and ascending aorta are structurally normal, with no evidence of dilitation. Venous: The inferior vena cava is dilated in size with greater than 50% respiratory variability, suggesting right atrial pressure of 8 mmHg. IAS/Shunts: No atrial level shunt detected by color flow Doppler.  LEFT VENTRICLE PLAX 2D LVIDd:         5.00 cm  Diastology LVIDs:         3.70 cm  LV e' medial:    5.22 cm/s LV PW:         1.10 cm  LV E/e' medial:  26.2 LV IVS:        1.10 cm  LV e' lateral:   9.68 cm/s LVOT diam:     2.00 cm  LV E/e' lateral: 14.2 LV SV:         67 LV SV Index:   34  LVOT Area:     3.14 cm  RIGHT VENTRICLE RV S prime:     9.90 cm/s TAPSE (M-mode): 1.5 cm LEFT ATRIUM             Index       RIGHT ATRIUM           Index LA diam:        4.70 cm 2.38 cm/m  RA Area:     17.80 cm LA Vol (A2C):   96.1 ml 48.70 ml/m RA Volume:   46.20 ml  23.41 ml/m LA Vol (A4C):   74.7 ml 37.86 ml/m LA Biplane Vol: 87.6 ml 44.40 ml/m  AORTIC VALVE LVOT Vmax:   118.00 cm/s LVOT Vmean:  82.300 cm/s LVOT VTI:    0.213 m AI PHT:      465 msec  AORTA Ao Root diam: 3.30 cm MITRAL VALVE                TRICUSPID VALVE MV Area (PHT): 5.97 cm     TR Peak grad:   39.7 mmHg MV Decel Time: 127 msec     TR Vmax:        315.00 cm/s MR Peak grad: 82.1 mmHg MR Mean grad: 57.0 mmHg     SHUNTS MR Vmax:      453.00 cm/s   Systemic VTI:  0.21 m MR Vmean:     358.0 cm/s    Systemic Diam: 2.00 cm MV E velocity: 137.00 cm/s MV A velocity: 67.80 cm/s MV E/A ratio:  2.02 Lyman Bishop MD Electronically signed by Lyman Bishop MD Signature Date/Time: 07/06/2020/5:11:10 PM    Final  Cardiac Studies   Echo 07/06/20 1. Left ventricular ejection fraction, by estimation, is 45 to 50%. The  left ventricle has mildly decreased function. The left ventricle  demonstrates regional wall motion abnormalities (see scoring  diagram/findings for description). There is mild left  ventricular hypertrophy. Left ventricular diastolic parameters are  consistent with Grade II diastolic dysfunction (pseudonormalization).  Elevated left ventricular end-diastolic pressure. There is severe akinesis  of the left ventricular, mid-apical  inferior wall and apical segment.  2. Right ventricular systolic function is low normal. The right  ventricular size is normal. There is moderately elevated pulmonary artery  systolic pressure.  3. Left atrial size was moderately dilated.  4. The mitral valve is abnormal. Mild mitral valve regurgitation.  5. The aortic valve is tricuspid. Aortic valve regurgitation is trivial.  6. The  inferior vena cava is dilated in size with >50% respiratory  variability, suggesting right atrial pressure of 8 mmHg.   Cardiac cath 07/2016  The left ventricular systolic function is normal.  The left ventricular ejection fraction is 45-50% by visual estimate.  There is no mitral valve regurgitation.  Ost 1st Diag to 1st Diag lesion, 65 %stenosed.  Mid LAD lesion, 100 %stenosed.  SVG and is normal in caliber.  SVG.  LIMA.  Dist LAD lesion, 50 %stenosed.  Ost 2nd Diag to 2nd Diag lesion, 100 %stenosed.  [place]amus lesion, 100 %stenosed.  Mid RCA lesion, 100 %stenosed.  Prox Graft to Dist Graft lesion, 20 %stenosed.  Origin lesion, 40 %stenosed.   Widely patent previously placed bypass grafts including LIMA to mid LAD, saphenous graft to diagonal, and saphenous vein graft to obtuse marginal #1/ramus intermedius.  Total occlusion of the proximal LAD in stent.  Total occlusion of the obtuse marginal #1/ramus intermedius  60-70% stenosis in the native first diagonal  Total occlusion of the native mid right coronary collateralized by both right to right and left-to-right collaterals. The right coronary is dominant.  Normal left ventricular hemodynamics. LVEF 45-50 %.  Recommendations:   Continue aggressive risk factor modification.  In absence of cardiac symptoms, medical therapy seems most appropriate. Therefore, CTO recanalization will not be pursued..  Coronary Diagrams  Diagnostic Dominance: Right    Patient Profile     64 y.o. male  a history of CADs/p PCI andsubsequent CABGin 2000,DMII, GERD, hyperlipidemia, hypertension, sleep apnea on CPAP,chronic anemia on iron infusions, known pulmonary noduleswho is currently undergoing work-up for anemia, weight loss, and renal mass who presented with worsening SOB and LE edema found to have acute on chronic heart failure exacerbation.  Assessment & Plan    #Acute on chronic systolic and diastolic  heart failure exacerbation #Acute respiratory failure: Improving. Currently with NYHA class II symptoms. Patient with known ischemic systolic dysfunction with LVEF 45-50%. Wall motion noted on TTE consistent with known coronary disease most notably in the inferior wall. Do not suspect that acute exacerbation of underlying HF is due to worsening of underlying CAD/ischemia, but rather was likely triggered by significant IV fluid administration in the setting of blood transfusions, iron infusions, and IVF received for renal biopsy. Now improving with diuresis.  -Acute exacerbation likely triggered by multiple iron infusions, blood transfusion and IVF administered during renal biopsy -TTE with LVEF of 45 to 50% (unchanged from cath in 2017),  mild LVH, grade 2 diastolic dysfunction, low normal RV function, moderately elevated pulmonary artery pressure, moderately dilated left atrium, mild MR, trivial AI.  -Increase lasix to 80mg  IV BID for improved diuresis; goal -  1-2L -Will need lasix with IVF/IV blood product administration in the future -Continue bisoprolol 10mg  daily -Change ARB to entresto for optimization of HF medications -Would benefit from spironolactone and SGLT-2 inhibitor; can add prior to discharge -Strict I/O's, daily weights, monitor creatinine with diuresis  #Multivessel CAD s/p CABG Last cath in 2017 with widely patent previously placed bypass grafts including LIMA to mid LAD, saphenous graft to diagonal, and saphenous vein graft to obtuse marginal #1/ramus intermedius. Known CTO of RCA with left to right collaterals. No current chest pain with low suspicion of ischemia. -Continue ASA, statin -Continue bisoprolol -Change ARB to entresto as above  #Hyperlipidemia -Continue statin and Lovaza  - LDL 34 02/21/20  #Hypertension -Continue amlodipine 5 mg daily, bisoprolol 10 mg daily -Change ARB to entresto as above  #Diabetes type 2 -SSI - A1C 6.4 -SGLT-2 inhibitor prior to  discharge  #Iron deficiency anemia #Renal Mass -Currently undergoing extensive work-up for underlying anemia and renal mass -Will need lasix with IVF/IV blood product administration in the future to prevent acute exacerbation of HF as detailed above  Total time of encounter: 35 minutes total time of encounter, including 20 minutes spent in face-to-face patient care on the date of this encounter. This time includes coordination of care and counseling regarding above mentioned problem list. Remainder of non-face-to-face time involved reviewing chart documents/testing relevant to the patient encounter and documentation in the medical record. I have independently reviewed documentation from referring provider.   Gwyndolyn Kaufman, MD [place]      For questions or updates, please contact [place] Please consult [email or web address] for contact info under        Signed, Freada Bergeron, MD  07/08/2020, 7:34 AM

## 2020-07-08 NOTE — Plan of Care (Signed)
Denies SOB. Remains on RA.Marland Kitchen Lasix  given as ordered.

## 2020-07-08 NOTE — Progress Notes (Signed)
[facility] PROGRESS NOTE    Larry Garner  CBS:[id number] DOB: 05-Jul-1956 DOA: 07/05/2020 PCP: Larry Olp, MD      Brief Narrative:  Larry Garner is a 64 y.o. M with CAD s/p CABG 2000, HTN, DM, chronic IDA on iron infusions, known pulmonary nodule followed by Larry Garner and Larry Garner and renal mass s/p recent renal mass biopsy who presented with few days progressive SOB and pedal edema.  In the ER, BNP >1000, CXR with interstitial edema and cardiomegaly.  Admitted and started on IV Lasix.        Assessment & Plan:  Acute on chronic diastolic and systolic CHF Ischemic cardiomyopathy Net negative only 500cc yesterday, Cr and K normal. -Continue Lasix, increase dose -Continue K -Strict I/Os, daily weights, telemetry  -Daily monitoring renal function  -Continue bisoprolol -Resume home SGLT-2 inhib -Cardiology to start Entresto     Ischemic heart disease secondary prevention Hypertension Blood pressure normal -Continue amlodipine, bisop -Start Entrestol -Hold HCTZ -Continue Cresotr and aspirin   Diabetes A1c shows excellent long term control at home.  Glucoses normal here. -Hold glimepiride and Ozempic and metformin -Restart canagliflozin -Continue SS corrections  Iron deficiency anemia Hgb drifting down, no clinical bleeding  Hypofolatemia -Supplement folate  Pulmonary nodule Renal mass His biopsy results are pending very soon.    B symptoms The patient has been having the symptoms (night sweats, intermittent fevers) for many months.  At present, I have low suspicion this represents infection.              Disposition: Status is: Inpatient  Remains inpatient appropriate because:IV treatments appropriate due to intensity of illness or inability to take PO   Dispo: The patient is from: Home              Anticipated d/c is to: Home              Anticipated d/c date is: 1 to 2 days              Patient currently  is not medically stable to d/c.     Patient was admitted with CHF flare requiring IV diuresis.   He still has marked edema and dyspnea with exertion, I agree with Cardiology, he requires increased doses of IV diuretic, but I am still optimistic he will be ready to transtiion home Weds or Thursday, he is eager for discharge.           MDM: The below labs and imaging reports were reviewed and summarized above.  Medication management as above.       DVT prophylaxis: enoxaparin (LOVENOX) injection 40 mg Start: 07/06/20 1400 SCDs Start: 07/05/20 1243  Code Status: FULL Family Communication: Wife at bedside           Subjective: Night sweats last night, no fever.  Swelling marginally improved, still short of breath with exertion.          Objective: Vitals:   07/07/20 1245 07/07/20 2144 07/08/20 0523 07/08/20 1436  BP:  130/77 120/67 118/67  Pulse:  82 78 81  Resp:  18 18 18   Temp:  98.9 F (37.2 C) 98.2 F (36.8 C) 99.2 F (37.3 C)  TempSrc:  Oral Oral Oral  SpO2: 97% 98% 93% 97%  Weight:   81.5 kg   Height:        Intake/Output Summary (Last 24 hours) at 07/08/2020 1536 Last data filed at 07/08/2020 0620 Gross per 24 hour  Intake 480 ml  Output 1000 ml  Net -520 ml   Filed Weights   07/06/20 0648 07/07/20 0456 07/08/20 0523  Weight: 77.6 kg 81.8 kg 81.5 kg    Examination: General appearance: Adult male, sitting in recliner, interactive, pleasant.     HEENT:    Skin:  Cardiac: Regular rate and rhythm, no murmurs, 2+ lower extremity edema, dimension. Respiratory: Normal respiratory rate and rhythm, lungs clear without rales or wheezes Abdomen:   MSK: Normal muscle bulk and tone, no clubbing. Neuro: Awake and alert, extraocular movements intact, moves all extremities with normal strength and coordination, speech fluent. Psych: Affect normal, judgment and insight appear normal, attention normal.  Data Reviewed: I have personally reviewed  following labs and imaging studies:  CBC: Recent Labs  Lab 07/05/20 0912 07/07/20 0129 07/08/20 0443  WBC 13.2* 10.4 10.4  NEUTROABS 10.7*  --   --   HGB 9.1* 8.3* 8.2*  HCT 30.4* 28.1* 28.3*  MCV 79.4* 78.7* 79.1*  PLT 518* 406* 101   Basic Metabolic Panel: Recent Labs  Lab 07/05/20 0912 07/05/20 0929 07/06/20 0836 07/07/20 0129 07/08/20 0443  NA 134*  --  134* 135 134*  K 3.6  --  3.4* 3.4* 3.8  CL 97*  --  96* 98 96*  CO2 24  --  25 25 24   GLUCOSE 148*  --  160* 137* 143*  BUN 15  --  14 15 17   CREATININE 1.01  --  0.96 0.92 1.04  CALCIUM 9.0  --  8.9 8.7* 9.0  MG  --  2.1  --   --   --   PHOS  --  2.9  --   --   --    GFR: Estimated Creatinine Clearance: 75.4 mL/min (by C-G formula based on SCr of 1.04 mg/dL). Liver Function Tests: No results for input(s): AST, ALT, ALKPHOS, BILITOT, PROT, ALBUMIN in the last 168 hours. No results for input(s): LIPASE, AMYLASE in the last 168 hours. No results for input(s): AMMONIA in the last 168 hours. Coagulation Profile: No results for input(s): INR, PROTIME in the last 168 hours. Cardiac Enzymes: Recent Labs  Lab 07/06/20 0836  CKTOTAL 26*   BNP (last 3 results) No results for input(s): PROBNP in the last 8760 hours. HbA1C: No results for input(s): HGBA1C in the last 72 hours. CBG: Recent Labs  Lab 07/07/20 1134 07/07/20 1610 07/07/20 2110 07/08/20 0754 07/08/20 1145  GLUCAP 142* 134* 192* 135* 170*   Lipid Profile: No results for input(s): CHOL, HDL, LDLCALC, TRIG, CHOLHDL, LDLDIRECT in the last 72 hours. Thyroid Function Tests: No results for input(s): TSH, T4TOTAL, FREET4, T3FREE, THYROIDAB in the last 72 hours. Anemia Panel: No results for input(s): VITAMINB12, FOLATE, FERRITIN, TIBC, IRON, RETICCTPCT in the last 72 hours. Urine analysis:    Component Value Date/Time   COLORURINE YELLOW 02/21/2020 2043   APPEARANCEUR CLEAR 02/21/2020 2043   LABSPEC 1.010 02/21/2020 2043   PHURINE 6.0 02/21/2020  2043   GLUCOSEU >=500 (A) 02/21/2020 2043   HGBUR TRACE (A) 02/21/2020 2043   HGBUR negative 09/08/2009 0812   BILIRUBINUR NEGATIVE 02/21/2020 2043   BILIRUBINUR n 11/04/2016 [place] 02/21/2020 2043   PROTEINUR NEGATIVE 02/21/2020 2043   UROBILINOGEN 0.2 11/04/2016 0949   UROBILINOGEN 0.2 09/08/2009 0812   NITRITE NEGATIVE 02/21/2020 2043   LEUKOCYTESUR NEGATIVE 02/21/2020 2043   Sepsis Labs: @LABRCNTIP (procalcitonin:4,lacticacidven:4)  ) Recent Results (from the past 240 hour(s))  Resp Panel by RT-PCR (Flu A&B,  Covid) Nasopharyngeal Swab     Status: None   Collection Time: 07/05/20  9:27 AM   Specimen: Nasopharyngeal Swab; Nasopharyngeal(NP) swabs in vial transport medium  Result Value Ref Range Status   SARS Coronavirus 2 by RT PCR NEGATIVE NEGATIVE Final    Comment: (NOTE) SARS-CoV-2 target nucleic acids are NOT DETECTED.  The SARS-CoV-2 RNA is generally detectable in upper respiratory specimens during the acute phase of infection. The lowest concentration of SARS-CoV-2 viral copies this assay can detect is 138 copies/mL. A negative result does not preclude SARS-Cov-2 infection and should not be used as the sole basis for treatment or other patient management decisions. A negative result may occur with  improper specimen collection/handling, submission of specimen other than nasopharyngeal swab, presence of viral mutation(s) within the areas targeted by this assay, and inadequate number of viral copies(<138 copies/mL). A negative result must be combined with clinical observations, patient history, and epidemiological information. The expected result is Negative.  Fact Sheet for Patients:  EntrepreneurPulse.com.au  Fact Sheet for Healthcare Providers:  IncredibleEmployment.be  This test is no t yet approved or cleared by the [place] FDA and  has been authorized for detection and/or diagnosis of SARS-CoV-2  by FDA under an Emergency Use Authorization (EUA). This EUA will remain  in effect (meaning this test can be used) for the duration of the COVID-19 declaration under Section 564(b)(1) of the Act, 21 U.S.C.section 360bbb-3(b)(1), unless the authorization is terminated  or revoked sooner.       Influenza A by PCR NEGATIVE NEGATIVE Final   Influenza B by PCR NEGATIVE NEGATIVE Final    Comment: (NOTE) The Xpert Xpress SARS-CoV-2/FLU/RSV plus assay is intended as an aid in the diagnosis of influenza from Nasopharyngeal swab specimens and should not be used as a sole basis for treatment. Nasal washings and aspirates are unacceptable for Xpert Xpress SARS-CoV-2/FLU/RSV testing.  Fact Sheet for Patients: EntrepreneurPulse.com.au  Fact Sheet for Healthcare Providers: IncredibleEmployment.be  This test is not yet approved or cleared by the [place] FDA and has been authorized for detection and/or diagnosis of SARS-CoV-2 by FDA under an Emergency Use Authorization (EUA). This EUA will remain in effect (meaning this test can be used) for the duration of the COVID-19 declaration under Section 564(b)(1) of the Act, 21 U.S.C. section 360bbb-3(b)(1), unless the authorization is terminated or revoked.  Performed at [facility], [address] [place], [place] [address]          Radiology Studies: DG CHEST PORT 1 VIEW  Result Date: 07/06/2020 CLINICAL DATA:  Shortness of breath and fever EXAM: PORTABLE CHEST 1 VIEW COMPARISON:  Film from the previous day FINDINGS: Cardiac shadow is stable. Postsurgical changes are again seen. Aortic calcifications are noted. The known left retro hilar nodular density is partially visualized on today's exam stable in appearance from the prior study. No new focal infiltrate is seen. Mild left basilar atelectasis is noted. Mild vascular congestion remains without edema. IMPRESSION: Stable left retro hilar  nodule. Mild left basilar atelectasis. Electronically Signed   By: Inez Catalina M.D.   On: 07/06/2020 19:42   ECHOCARDIOGRAM COMPLETE  Result Date: 07/06/2020    ECHOCARDIOGRAM REPORT   Patient Name:   Larry Garner Date of Exam: 07/06/2020 Medical Rec #:  [id number]      Height:       71.0 in Accession #:    [id number]     Weight:       171.1 lb  Date of Birth:  1955-09-23      BSA:          1.973 m Patient Age:    38 years       BP:           132/75 mmHg Patient Gender: M              HR:           81 bpm. Exam Location:  Inpatient Procedure: 2D Echo, Cardiac Doppler and Color Doppler Indications:    CHF-Acute Systolic 672.09 / O70.96  History:        Patient has no prior history of Echocardiogram examinations.                 CAD; Risk Factors:Hypertension, Diabetes, Dyslipidemia and                 Former Smoker. OSA (obstructive sleep apnea).  Sonographer:    Alvino Chapel RCS Referring Phys: [place]  1. Left ventricular ejection fraction, by estimation, is 45 to 50%. The left ventricle has mildly decreased function. The left ventricle demonstrates regional wall motion abnormalities (see scoring diagram/findings for description). There is mild left ventricular hypertrophy. Left ventricular diastolic parameters are consistent with Grade II diastolic dysfunction (pseudonormalization). Elevated left ventricular end-diastolic pressure. There is severe akinesis of the left ventricular, mid-apical inferior wall and apical segment.  2. Right ventricular systolic function is low normal. The right ventricular size is normal. There is moderately elevated pulmonary artery systolic pressure.  3. Left atrial size was moderately dilated.  4. The mitral valve is abnormal. Mild mitral valve regurgitation.  5. The aortic valve is tricuspid. Aortic valve regurgitation is trivial.  6. The inferior vena cava is dilated in size with >50% respiratory variability, suggesting right atrial pressure of 8  mmHg. FINDINGS  Left Ventricle: Left ventricular ejection fraction, by estimation, is 45 to 50%. The left ventricle has mildly decreased function. The left ventricle demonstrates regional wall motion abnormalities. Severe akinesis of the left ventricular, mid-apical inferior wall and apical segment. The left ventricular internal cavity size was normal in size. There is mild left ventricular hypertrophy. Left ventricular diastolic parameters are consistent with Grade II diastolic dysfunction (pseudonormalization). Elevated left ventricular end-diastolic pressure. Right Ventricle: The right ventricular size is normal. No increase in right ventricular wall thickness. Right ventricular systolic function is low normal. There is moderately elevated pulmonary artery systolic pressure. The tricuspid regurgitant velocity  is 3.15 m/s, and with an assumed right atrial pressure of 8 mmHg, the estimated right ventricular systolic pressure is 28.3 mmHg. Left Atrium: Left atrial size was moderately dilated. Right Atrium: Right atrial size was normal in size. Pericardium: There is no evidence of pericardial effusion. Mitral Valve: The mitral valve is abnormal. There is mild thickening of the mitral valve leaflet(s). Mild mitral valve regurgitation. Tricuspid Valve: The tricuspid valve is grossly normal. Tricuspid valve regurgitation is mild. Aortic Valve: The aortic valve is tricuspid. Aortic valve regurgitation is trivial. Aortic regurgitation PHT measures 465 msec. Pulmonic Valve: The pulmonic valve was normal in structure. Pulmonic valve regurgitation is not visualized. Aorta: The aortic root and ascending aorta are structurally normal, with no evidence of dilitation. Venous: The inferior vena cava is dilated in size with greater than 50% respiratory variability, suggesting right atrial pressure of 8 mmHg. IAS/Shunts: No atrial level shunt detected by color flow Doppler.  LEFT VENTRICLE PLAX 2D LVIDd:  5.00 cm   Diastology LVIDs:         3.70 cm  LV e' medial:    5.22 cm/s LV PW:         1.10 cm  LV E/e' medial:  26.2 LV IVS:        1.10 cm  LV e' lateral:   9.68 cm/s LVOT diam:     2.00 cm  LV E/e' lateral: 14.2 LV SV:         67 LV SV Index:   34 LVOT Area:     3.14 cm  RIGHT VENTRICLE RV S prime:     9.90 cm/s TAPSE (M-mode): 1.5 cm LEFT ATRIUM             Index       RIGHT ATRIUM           Index LA diam:        4.70 cm 2.38 cm/m  RA Area:     17.80 cm LA Vol (A2C):   96.1 ml 48.70 ml/m RA Volume:   46.20 ml  23.41 ml/m LA Vol (A4C):   74.7 ml 37.86 ml/m LA Biplane Vol: 87.6 ml 44.40 ml/m  AORTIC VALVE LVOT Vmax:   118.00 cm/s LVOT Vmean:  82.300 cm/s LVOT VTI:    0.213 m AI PHT:      465 msec  AORTA Ao Root diam: 3.30 cm MITRAL VALVE                TRICUSPID VALVE MV Area (PHT): 5.97 cm     TR Peak grad:   39.7 mmHg MV Decel Time: 127 msec     TR Vmax:        315.00 cm/s MR Peak grad: 82.1 mmHg MR Mean grad: 57.0 mmHg     SHUNTS MR Vmax:      453.00 cm/s   Systemic VTI:  0.21 m MR Vmean:     358.0 cm/s    Systemic Diam: 2.00 cm MV E velocity: 137.00 cm/s MV A velocity: 67.80 cm/s MV E/A ratio:  2.02 Lyman Bishop MD Electronically signed by Lyman Bishop MD Signature Date/Time: 07/06/2020/5:11:10 PM    Final         Scheduled Meds: . amLODipine  5 mg Oral Daily  . aspirin EC  81 mg Oral Daily  . bisoprolol  10 mg Oral Daily  . [START ON 07/09/2020] canagliflozin  100 mg Oral QAC breakfast  . enoxaparin (LOVENOX) injection  40 mg Subcutaneous Q24H  . feeding supplement  237 mL Oral BID BM  . folic acid  1 mg Oral Daily  . furosemide  80 mg Intravenous BID  . insulin aspart  0-15 Units Subcutaneous TID WC  . insulin aspart  0-5 Units Subcutaneous QHS  . multivitamin with minerals  1 tablet Oral Daily  . potassium chloride  20 mEq Oral BID  . rosuvastatin  20 mg Oral Daily  . [START ON 07/09/2020] sacubitril-valsartan  1 tablet Oral BID  . sodium chloride flush  3 mL Intravenous Q12H    Continuous Infusions: . sodium chloride       LOS: 3 days    Time spent: 25 minutes    Edwin Dada, MD [facility] 07/08/2020, 3:36 PM     Please page though [place] or Epic secure chat:  For [employer], Adult nurse

## 2020-07-09 DIAGNOSIS — I10 Essential (primary) hypertension: Secondary | ICD-10-CM

## 2020-07-09 DIAGNOSIS — D500 Iron deficiency anemia secondary to blood loss (chronic): Secondary | ICD-10-CM

## 2020-07-09 DIAGNOSIS — I509 Heart failure, unspecified: Secondary | ICD-10-CM

## 2020-07-09 DIAGNOSIS — E1169 Type 2 diabetes mellitus with other specified complication: Secondary | ICD-10-CM

## 2020-07-09 LAB — GLUCOSE, CAPILLARY
Glucose-Capillary: 234 mg/dL — ABNORMAL HIGH (ref 70–99)
Glucose-Capillary: 144 mg/dL — ABNORMAL HIGH (ref 70–99)
Glucose-Capillary: 192 mg/dL — ABNORMAL HIGH (ref 70–99)
Glucose-Capillary: 141 mg/dL — ABNORMAL HIGH (ref 70–99)
Glucose-Capillary: 278 mg/dL — ABNORMAL HIGH (ref 70–99)
Glucose-Capillary: 204 mg/dL — ABNORMAL HIGH (ref 70–99)

## 2020-07-09 LAB — CBC
HCT: 28.1 % — ABNORMAL LOW (ref 39.0–52.0)
Hemoglobin: 8.4 g/dL — ABNORMAL LOW (ref 13.0–17.0)
MCH: 23.6 pg — ABNORMAL LOW (ref 26.0–34.0)
MCHC: 29.9 g/dL — ABNORMAL LOW (ref 30.0–36.0)
MCV: 78.9 fL — ABNORMAL LOW (ref 80.0–100.0)
Platelets: 374 10*3/uL (ref 150–400)
RBC: 3.56 MIL/uL — ABNORMAL LOW (ref 4.22–5.81)
RDW: 20.2 % — ABNORMAL HIGH (ref 11.5–15.5)
WBC: 11.8 10*3/uL — ABNORMAL HIGH (ref 4.0–10.5)
nRBC: 0 % (ref 0.0–0.2)

## 2020-07-09 LAB — BASIC METABOLIC PANEL
Anion gap: 14 (ref 5–15)
BUN: 20 mg/dL (ref 8–23)
CO2: 23 mmol/L (ref 22–32)
Calcium: 9 mg/dL (ref 8.9–10.3)
Chloride: 96 mmol/L — ABNORMAL LOW (ref 98–111)
Creatinine, Ser: 0.99 mg/dL (ref 0.61–1.24)
GFR, Estimated: >60 mL/min (ref >60)
Glucose, Bld: 145 mg/dL — ABNORMAL HIGH (ref 70–99)
Potassium: 4 mmol/L (ref 3.5–5.1)
Sodium: 133 mmol/L — ABNORMAL LOW (ref 135–145)

## 2020-07-09 NOTE — Progress Notes (Signed)
[facility] PROGRESS NOTE    Larry Garner  BJS:[id number] DOB: 1956-04-13 DOA: 07/05/2020 PCP: Marin Olp, MD      Brief Narrative:  Larry Garner is a 64 y.o. M with CAD s/p CABG 2000, HTN, DM, chronic IDA on iron infusions, known pulmonary nodule followed by Dr. Elsworth Soho and Dr. Roxan Hockey and renal mass s/p recent renal mass biopsy who presented with few days progressive SOB and pedal edema.  In the ER, BNP >1000, CXR with interstitial edema and cardiomegaly.  Admitted and started on IV Lasix.    Assessment & Plan:  Acute on chronic diastolic and systolic CHF Ischemic cardiomyopathy Cardiology is following.  Based on echocardiogram done on 11/28 his EF is 45 to 50%.  He also has grade 2 diastolic dysfunction.  Patient is currently on intravenous furosemide.  Respiratory status is stable but he continues to have significant lower extremity edema.  Still have diuresed about 6 L during his hospital stay.  Weight appears to be about the same.  Further management per cardiology.  Patient remains on bisoprolol.  Started on [facility] by cardiology.  Strict ins and outs and daily weights.  Patient is on SGLT inhibitor at home which was resumed.   Ischemic heart disease secondary prevention Essential hypertension Seems to be stable.  Continue amlodipine.  Holding HCTZ.  On statin and aspirin.    Diabetes mellitus type 2  4.  Noted to be on glimepiride Ozempic and Metformin at home.  These are currently held.  Canagliflozin has been resumed.  SSI.    Iron deficiency anemia Hemoglobin stable for the most part.  No evidence of overt bleeding.    Folic acid deficiency -Supplement folate  Pulmonary nodule Renal mass Patient apparently underwent left renal biopsy at [facility].  Pathology report is still pending.      B symptoms The patient has been having the symptoms (night sweats, intermittent fevers) for many months.  Low suspicion for  infection.  Hyponatremia Stable.  Continue to monitor.  DVT prophylaxis: Lovenox CODE STATUS: Full code Family communication: Discussed with patient.  No family at bedside. Disposition: Hopefully return home when cleared by cardiology.   Status is: Inpatient  Remains inpatient appropriate because:IV treatments appropriate due to intensity of illness or inability to take PO   Dispo: The patient is from: Home              Anticipated d/c is to: Home              Anticipated d/c date is: 1 to 2 days              Patient currently is not medically stable to d/c.    Subjective: Patient denies any complaints this morning specifically he had no chest pain or shortness of breath.  Continues to have lower extremity edema.  No nausea vomiting.       Objective:  Vitals:   07/08/20 1436 07/08/20 2136 07/09/20 0512 07/09/20 0615  BP: 118/67 132/78 138/81   Pulse: 81 88 81   Resp: 18 19 18    Temp: 99.2 F (37.3 C) 99.3 F (37.4 C) 98.4 F (36.9 C)   TempSrc: Oral Oral Oral   SpO2: 97% 97% 99%   Weight:    82.7 kg  Height:        Intake/Output Summary (Last 24 hours) at 07/09/2020 1052 Last data filed at 07/09/2020 0511 Gross per 24 hour  Intake --  Output 2450 ml  Net -2450 ml   Filed Weights   07/07/20 0456 07/08/20 0523 07/09/20 0615  Weight: 81.8 kg 81.5 kg 82.7 kg    Examination:  General appearance: Awake alert.  In no distress Resp: Crackles at the bases.  Normal effort.  No wheezing or rhonchi. Cardio: S1-S2 is normal regular.  No S3-S4.  No rubs murmurs or bruit GI: Abdomen is soft.  Nontender nondistended.  Bowel sounds are present normal.  No masses organomegaly Extremities: 1+ pitting edema bilateral lower extremities. Neurologic: Alert and oriented x3.  No focal neurological deficits.    Data Reviewed: I have personally reviewed following labs and imaging studies:  CBC: Recent Labs  Lab 07/05/20 0912 07/07/20 0129 07/08/20 0443 07/09/20 0156   WBC 13.2* 10.4 10.4 11.8*  NEUTROABS 10.7*  --   --   --   HGB 9.1* 8.3* 8.2* 8.4*  HCT 30.4* 28.1* 28.3* 28.1*  MCV 79.4* 78.7* 79.1* 78.9*  PLT 518* 406* 386 892   Basic Metabolic Panel: Recent Labs  Lab 07/05/20 0912 07/05/20 0929 07/06/20 0836 07/07/20 0129 07/08/20 0443 07/09/20 0156  NA 134*  --  134* 135 134* 133*  K 3.6  --  3.4* 3.4* 3.8 4.0  CL 97*  --  96* 98 96* 96*  CO2 24  --  25 25 24 23   GLUCOSE 148*  --  160* 137* 143* 145*  BUN 15  --  14 15 17 20   CREATININE 1.01  --  0.96 0.92 1.04 0.99  CALCIUM 9.0  --  8.9 8.7* 9.0 9.0  MG  --  2.1  --   --   --   --   PHOS  --  2.9  --   --   --   --    GFR: Estimated Creatinine Clearance: 79.2 mL/min (by C-G formula based on SCr of 0.99 mg/dL).  Cardiac Enzymes: Recent Labs  Lab 07/06/20 0836  CKTOTAL 26*   CBG: Recent Labs  Lab 07/07/20 2110 07/08/20 0754 07/08/20 1145 07/08/20 1614 07/09/20 0828  GLUCAP 192* 135* 170* 134* 192*    Recent Results (from the past 240 hour(s))  Resp Panel by RT-PCR (Flu A&B, Covid) Nasopharyngeal Swab     Status: None   Collection Time: 07/05/20  9:27 AM   Specimen: Nasopharyngeal Swab; Nasopharyngeal(NP) swabs in vial transport medium  Result Value Ref Range Status   SARS Coronavirus 2 by RT PCR NEGATIVE NEGATIVE Final    Comment: (NOTE) SARS-CoV-2 target nucleic acids are NOT DETECTED.  The SARS-CoV-2 RNA is generally detectable in upper respiratory specimens during the acute phase of infection. The lowest concentration of SARS-CoV-2 viral copies this assay can detect is 138 copies/mL. A negative result does not preclude SARS-Cov-2 infection and should not be used as the sole basis for treatment or other patient management decisions. A negative result may occur with  improper specimen collection/handling, submission of specimen other than nasopharyngeal swab, presence of viral mutation(s) within the areas targeted by this assay, and inadequate number of  viral copies(<138 copies/mL). A negative result must be combined with clinical observations, patient history, and epidemiological information. The expected result is Negative.  Fact Sheet for Patients:  EntrepreneurPulse.com.au  Fact Sheet for Healthcare Providers:  IncredibleEmployment.be  This test is no t yet approved or cleared by the [place] FDA and  has been authorized for detection and/or diagnosis of SARS-CoV-2 by FDA under an Emergency Use Authorization (EUA). This EUA will remain  in effect (meaning this  test can be used) for the duration of the COVID-19 declaration under Section 564(b)(1) of the Act, 21 U.S.C.section 360bbb-3(b)(1), unless the authorization is terminated  or revoked sooner.       Influenza A by PCR NEGATIVE NEGATIVE Final   Influenza B by PCR NEGATIVE NEGATIVE Final    Comment: (NOTE) The Xpert Xpress SARS-CoV-2/FLU/RSV plus assay is intended as an aid in the diagnosis of influenza from Nasopharyngeal swab specimens and should not be used as a sole basis for treatment. Nasal washings and aspirates are unacceptable for Xpert Xpress SARS-CoV-2/FLU/RSV testing.  Fact Sheet for Patients: EntrepreneurPulse.com.au  Fact Sheet for Healthcare Providers: IncredibleEmployment.be  This test is not yet approved or cleared by the [place] FDA and has been authorized for detection and/or diagnosis of SARS-CoV-2 by FDA under an Emergency Use Authorization (EUA). This EUA will remain in effect (meaning this test can be used) for the duration of the COVID-19 declaration under Section 564(b)(1) of the Act, 21 U.S.C. section 360bbb-3(b)(1), unless the authorization is terminated or revoked.  Performed at [facility], [address] [place], [place] [address]          Radiology Studies: No results found.      Scheduled Meds: . amLODipine  5 mg Oral Daily   . aspirin EC  81 mg Oral Daily  . bisoprolol  10 mg Oral Daily  . canagliflozin  100 mg Oral QAC breakfast  . enoxaparin (LOVENOX) injection  40 mg Subcutaneous Q24H  . feeding supplement  237 mL Oral BID BM  . folic acid  1 mg Oral Daily  . furosemide  80 mg Intravenous BID  . insulin aspart  0-15 Units Subcutaneous TID WC  . insulin aspart  0-5 Units Subcutaneous QHS  . multivitamin with minerals  1 tablet Oral Daily  . potassium chloride  20 mEq Oral BID  . rosuvastatin  20 mg Oral Daily  . sacubitril-valsartan  1 tablet Oral BID  . sodium chloride flush  3 mL Intravenous Q12H   Continuous Infusions: . sodium chloride       LOS: 4 days       Bonnielee Haff, MD [facility] 07/09/2020, 10:52 AM     Please page though [place], contact charge nurse

## 2020-07-09 NOTE — Progress Notes (Signed)
Progress Note  Patient Name: Larry Garner Date of Encounter: 07/09/2020  [facility] Cardiologist: Jenkins Rouge, MD   Subjective   Feels well this morning. SOB improved. Continues to have LE edema. Concerned about his renal biopsy results.   Diuresed well with 2.45L UOP; wt up but was a bed weight--needs daily standing weights  Weaned off oxygen  Inpatient Medications    Scheduled Meds: . amLODipine  5 mg Oral Daily  . aspirin EC  81 mg Oral Daily  . bisoprolol  10 mg Oral Daily  . canagliflozin  100 mg Oral QAC breakfast  . enoxaparin (LOVENOX) injection  40 mg Subcutaneous Q24H  . feeding supplement  237 mL Oral BID BM  . folic acid  1 mg Oral Daily  . furosemide  80 mg Intravenous BID  . insulin aspart  0-15 Units Subcutaneous TID WC  . insulin aspart  0-5 Units Subcutaneous QHS  . multivitamin with minerals  1 tablet Oral Daily  . potassium chloride  20 mEq Oral BID  . rosuvastatin  20 mg Oral Daily  . sacubitril-valsartan  1 tablet Oral BID  . sodium chloride flush  3 mL Intravenous Q12H   Continuous Infusions: . sodium chloride     PRN Meds: sodium chloride, acetaminophen, albuterol, ALPRAZolam, nitroGLYCERIN, ondansetron (ZOFRAN) IV, sodium chloride flush, zolpidem   Vital Signs    Vitals:   07/08/20 1436 07/08/20 2136 07/09/20 0512 07/09/20 0615  BP: 118/67 132/78 138/81   Pulse: 81 88 81   Resp: 18 19 18    Temp: 99.2 F (37.3 C) 99.3 F (37.4 C) 98.4 F (36.9 C)   TempSrc: Oral Oral Oral   SpO2: 97% 97% 99%   Weight:    82.7 kg  Height:        Intake/Output Summary (Last 24 hours) at 07/09/2020 0800 Last data filed at 07/09/2020 0511 Gross per 24 hour  Intake --  Output 2450 ml  Net -2450 ml   Last 3 Weights 07/09/2020 07/08/2020 07/07/2020  Weight (lbs) 182 lb 5.1 oz 179 lb 10.8 oz 180 lb 5.4 oz  Weight (kg) 82.7 kg 81.5 kg 81.8 kg      Telemetry    NSR - Personally Reviewed  ECG    No new ECG - Personally Reviewed  Physical  Exam   GEN: No acute distress.   Neck: No JVD Cardiac: RRR, no murmurs, rubs, or gallops.  Respiratory: Clear to auscultation bilaterally. GI: Soft, nontender, non-distended  MS: 1+ pitting edema to the shin with L>R Neuro:  Nonfocal  Psych: Normal affect   Labs    High Sensitivity Troponin:   Recent Labs  Lab 07/05/20 0929 07/05/20 1148 07/05/20 1446  TROPONINIHS 17 15 17       Chemistry Recent Labs  Lab 07/07/20 0129 07/08/20 0443 07/09/20 0156  NA 135 134* 133*  K 3.4* 3.8 4.0  CL 98 96* 96*  CO2 25 24 23   GLUCOSE 137* 143* 145*  BUN 15 17 20   CREATININE 0.92 1.04 0.99  CALCIUM 8.7* 9.0 9.0  GFRNONAA >60 >60 >60  ANIONGAP 12 14 14      Hematology Recent Labs  Lab 07/07/20 0129 07/08/20 0443 07/09/20 0156  WBC 10.4 10.4 11.8*  RBC 3.57* 3.58* 3.56*  HGB 8.3* 8.2* 8.4*  HCT 28.1* 28.3* 28.1*  MCV 78.7* 79.1* 78.9*  MCH 23.2* 22.9* 23.6*  MCHC 29.5* 29.0* 29.9*  RDW 20.0* 20.5* 20.2*  PLT 406* 386 374    BNP  Recent Labs  Lab 07/05/20 0912  BNP 1,435.5*     DDimer No results for input(s): DDIMER in the last 168 hours.   Radiology    No results found.  Cardiac Studies   Echo 07/06/20 1. Left ventricular ejection fraction, by estimation, is 45 to 50%. The  left ventricle has mildly decreased function. The left ventricle  demonstrates regional wall motion abnormalities (see scoring  diagram/findings for description). There is mild left  ventricular hypertrophy. Left ventricular diastolic parameters are  consistent with Grade II diastolic dysfunction (pseudonormalization).  Elevated left ventricular end-diastolic pressure. There is severe akinesis  of the left ventricular, mid-apical  inferior wall and apical segment.  2. Right ventricular systolic function is low normal. The right  ventricular size is normal. There is moderately elevated pulmonary artery  systolic pressure.  3. Left atrial size was moderately dilated.  4. The mitral  valve is abnormal. Mild mitral valve regurgitation.  5. The aortic valve is tricuspid. Aortic valve regurgitation is trivial.  6. The inferior vena cava is dilated in size with >50% respiratory  variability, suggesting right atrial pressure of 8 mmHg.   Cardiac cath 07/2016  The left ventricular systolic function is normal.  The left ventricular ejection fraction is 45-50% by visual estimate.  There is no mitral valve regurgitation.  Ost 1st Diag to 1st Diag lesion, 65 %stenosed.  Mid LAD lesion, 100 %stenosed.  SVG and is normal in caliber.  SVG.  LIMA.  Dist LAD lesion, 50 %stenosed.  Ost 2nd Diag to 2nd Diag lesion, 100 %stenosed.  Ramus lesion, 100 %stenosed.  Mid RCA lesion, 100 %stenosed.  Prox Graft to Dist Graft lesion, 20 %stenosed.  Origin lesion, 40 %stenosed.   Widely patent previously placed bypass grafts including LIMA to mid LAD, saphenous graft to diagonal, and saphenous vein graft to obtuse marginal #1/ramus intermedius.  Total occlusion of the proximal LAD in stent.  Total occlusion of the obtuse marginal #1/ramus intermedius  60-70% stenosis in the native first diagonal  Total occlusion of the native mid right coronary collateralized by both right to right and left-to-right collaterals. The right coronary is dominant.  Normal left ventricular hemodynamics. LVEF 45-50 %.  Recommendations:   Continue aggressive risk factor modification.  In absence of cardiac symptoms, medical therapy seems most appropriate. Therefore, CTO recanalization will not be pursued..  Coronary Diagrams  Diagnostic Dominance: Right     Patient Profile     64 y.o. male a history of CADs/p PCI andsubsequent CABGin 2000,DMII, GERD, hyperlipidemia, hypertension, sleep apnea on CPAP,chronic anemia on iron infusions, known pulmonary noduleswho is currently undergoing work-up for anemia, weight loss, and renal mass who presented with worsening SOB and  LE edema found to have acute on chronic heart failure exacerbation.  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 Currently with NYHA class II symptoms. Patient with known ischemic systolic dysfunction with LVEF 45-50%. Wall motion noted on TTE consistent with known coronary disease most notably in the inferior wall. Do not suspect that acute exacerbation of underlying HF is due to worsening of underlying CAD/ischemia, but rather was likely triggered by significant IV fluid administration in the setting of blood transfusions, iron infusions, and IVF received for renal biopsy. Now improving with diuresis.  -Acute exacerbation likely triggered by multiple iron infusions, blood transfusion and IVF administered during renal biopsy -TTE with LVEFof45 to 50%(unchanged from cath in 2017),  mild LVH, grade  2 diastolic dysfunction, low normal RV function, moderately elevated pulmonary artery pressure, moderately dilated left atrium, mild MR, trivial AI -Continue lasix 80mg  IV BID -Will need lasix with IVF/IV blood product administration in the future -Continue bisoprolol 10mg  daily -Change ARB to entresto for optimization of HF medications--day 1 today -Would benefit from spironolactone and SGLT-2 inhibitor; can add prior to discharge -Strict I/O's -Needs daily standing weights -Apply compression stockings today  #Multivessel CAD s/p CABG Last cath in 2017 with widely patent previously placed bypass grafts including LIMA to mid LAD, saphenous graft to diagonal, and saphenous vein graft to obtuse marginal #1/ramus intermedius. Known CTO of RCA with left to right collaterals. No current chest pain with low suspicion of ischemia. -Continue ASA, statin -Continue bisoprolol -Change ARB to entresto as above--day 1 today  #Hyperlipidemia -Continue statin and Lovaza - LDL 34 02/21/20  #Hypertension -Continue  amlodipine 5 mg daily, bisoprolol 10 mg daily -Change ARB to entresto as above  #Diabetes type 2 -SSI - A1C 6.4 -SGLT-2 inhibitor prior to discharge  #Iron deficiency anemia #Renal Mass -Currently undergoing extensive work-up for underlying anemia and renal mass -Will need lasix with IVF/IV blood product administration in the future to prevent acute exacerbation of HF as detailed above    For questions or updates, please contact [facility] Please consult [email or web address] for contact info under        Signed, Freada Bergeron, MD  07/09/2020, 8:00 AM

## 2020-07-09 NOTE — Plan of Care (Signed)
  Problem: Education: Goal: Knowledge of General Education information will improve Description Including pain rating scale, medication(s)/side effects and non-pharmacologic comfort measures Outcome: Progressing   

## 2020-07-10 ENCOUNTER — Encounter (HOSPITAL_COMMUNITY): Payer: Self-pay | Admitting: Internal Medicine

## 2020-07-10 LAB — HEPATIC FUNCTION PANEL
ALT: 15 U/L (ref 0–44)
AST: 18 U/L (ref 15–41)
Albumin: 2.1 g/dL — ABNORMAL LOW (ref 3.5–5.0)
Alkaline Phosphatase: 93 U/L (ref 38–126)
Bilirubin, Direct: <0.1 mg/dL (ref 0.0–0.2)
Total Bilirubin: 0.7 mg/dL (ref 0.3–1.2)
Total Protein: 6.9 g/dL (ref 6.5–8.1)

## 2020-07-10 LAB — BASIC METABOLIC PANEL
Anion gap: 11 (ref 5–15)
BUN: 22 mg/dL (ref 8–23)
CO2: 26 mmol/L (ref 22–32)
Calcium: 8.9 mg/dL (ref 8.9–10.3)
Chloride: 95 mmol/L — ABNORMAL LOW (ref 98–111)
Creatinine, Ser: 1.25 mg/dL — ABNORMAL HIGH (ref 0.61–1.24)
GFR, Estimated: >60 mL/min (ref >60)
Glucose, Bld: 168 mg/dL — ABNORMAL HIGH (ref 70–99)
Potassium: 3.9 mmol/L (ref 3.5–5.1)
Sodium: 132 mmol/L — ABNORMAL LOW (ref 135–145)

## 2020-07-10 LAB — CBC
HCT: 29.1 % — ABNORMAL LOW (ref 39.0–52.0)
Hemoglobin: 8.5 g/dL — ABNORMAL LOW (ref 13.0–17.0)
MCH: 22.9 pg — ABNORMAL LOW (ref 26.0–34.0)
MCHC: 29.2 g/dL — ABNORMAL LOW (ref 30.0–36.0)
MCV: 78.4 fL — ABNORMAL LOW (ref 80.0–100.0)
Platelets: 367 10*3/uL (ref 150–400)
RBC: 3.71 MIL/uL — ABNORMAL LOW (ref 4.22–5.81)
RDW: 20.2 % — ABNORMAL HIGH (ref 11.5–15.5)
WBC: 10.7 10*3/uL — ABNORMAL HIGH (ref 4.0–10.5)
nRBC: 0 % (ref 0.0–0.2)

## 2020-07-10 LAB — GLUCOSE, CAPILLARY
Glucose-Capillary: 163 mg/dL — ABNORMAL HIGH (ref 70–99)
Glucose-Capillary: 174 mg/dL — ABNORMAL HIGH (ref 70–99)
Glucose-Capillary: 162 mg/dL — ABNORMAL HIGH (ref 70–99)
Glucose-Capillary: 180 mg/dL — ABNORMAL HIGH (ref 70–99)

## 2020-07-10 MED ORDER — FUROSEMIDE 40 MG PO TABS
40.0000 mg | ORAL_TABLET | Freq: Every day | ORAL | Status: DC
  Administered 2020-07-10: 40 mg via ORAL
  Filled 2020-07-10: qty 1

## 2020-07-10 MED ORDER — FUROSEMIDE 40 MG PO TABS
40.0000 mg | ORAL_TABLET | Freq: Two times a day (BID) | ORAL | Status: DC
  Administered 2020-07-10 – 2020-07-11 (×2): 40 mg via ORAL
  Filled 2020-07-10 (×2): qty 1

## 2020-07-10 NOTE — Progress Notes (Addendum)
[facility] PROGRESS NOTE    Larry Garner  UYQ:[id number] DOB: 18-Oct-1955 DOA: 07/05/2020 PCP: Larry Olp, MD      Brief Narrative:  Larry Garner is a 64 y.o. M with CAD s/p CABG 2000, HTN, DM, chronic IDA on iron infusions, known pulmonary nodule followed by [facility] and [facility] and renal mass s/p recent renal mass biopsy who presented with few days progressive SOB and pedal edema.  In the ER, BNP >1000, CXR with interstitial edema and cardiomegaly.  Admitted and started on IV Lasix.    Assessment & Plan:  Acute on chronic diastolic and systolic CHF Ischemic cardiomyopathy Based on echocardiogram done on 11/28 his EF is 45 to 50%.  He also has grade 2 diastolic dysfunction.   Cardiology was consulted.  Patient started on intravenous furosemide.  He has diuresed well.  Continues to have significant lower extremity edema although the reason for his pedal edema could be multifactorial.  His creatinine noted to be slightly higher today.  Appears that cardiology has changed over to oral furosemide.   We will recheck his labs tomorrow.  Continue with bisoprolol and Entresto as started by cardiology.   Continue with strict ins and outs and daily weights.  Patient is on SGLT inhibitor at home.  Spironolactone to be considered in the outpatient setting.    Ischemic heart disease secondary prevention Essential hypertension Seems to be stable.  Continue amlodipine.  Holding HCTZ.  On statin and aspirin.    Diabetes mellitus type 2  HbA1c 6.4.  Noted to be on glimepiride, canagliflozin, Ozempic and Metformin at home.   Canagliflozin has been resumed.  SSI.  CBGs are reasonably well controlled.  Iron deficiency anemia Hemoglobin stable for the most part.  No evidence of overt bleeding.    Folic acid deficiency Supplement folate  Pulmonary nodule Renal mass Patient apparently underwent left renal biopsy at [facility].  Apparently is positive for  malignancy.  Patient has an appointment with urology next week.    B symptoms The patient has been having the symptoms (night sweats, intermittent fevers) for many months.  Low suspicion for infection.  Hyponatremia Stable.  Continue to monitor.  DVT prophylaxis: Lovenox CODE STATUS: Full code Family communication: Discussed with patient.  No family at bedside. Disposition: Hopefully return home when cleared by cardiology.   Status is: Inpatient  Remains inpatient appropriate because:IV treatments appropriate due to intensity of illness or inability to take PO   Dispo: The patient is from: Home              Anticipated d/c is to: Home              Anticipated d/c date is: 1 to 2 days              Patient currently is not medically stable to d/c.    Subjective: Patient mentions that overall he is feeling better.  Shortness of breath has improved.  Denies any nausea vomiting.      Objective:  Vitals:   07/09/20 1541 07/09/20 2115 07/10/20 0610 07/10/20 0621  BP: 117/61 125/70 118/67   Pulse: 80 82 79   Resp: (!) 22 20 18    Temp: 99 F (37.2 C) 100.1 F (37.8 C) 98.3 F (36.8 C)   TempSrc:  Oral Oral   SpO2: 95% 98% 95%   Weight:    79.2 kg  Height:        Intake/Output Summary (Last  24 hours) at 07/10/2020 1020 Last data filed at 07/10/2020 1000 Gross per 24 hour  Intake --  Output 2750 ml  Net -2750 ml   Filed Weights   07/09/20 0615 07/09/20 1100 07/10/20 0621  Weight: 82.7 kg 80.6 kg 79.2 kg    Examination:  General appearance: Awake alert.  In no distress Resp: Clear to auscultation bilaterally.  Normal effort Cardio: S1-S2 is normal regular.  No S3-S4.  No rubs murmurs or bruit GI: Abdomen is soft.  Nontender nondistended.  Bowel sounds are present normal.  No masses organomegaly Extremities: Plus pitting edema bilateral lower extremities  Neurologic: Alert and oriented x3.  No focal neurological deficits.      Data Reviewed: I have personally  reviewed following labs and imaging studies:  CBC: Recent Labs  Lab 07/05/20 0912 07/07/20 0129 07/08/20 0443 07/09/20 0156 07/10/20 0258  WBC 13.2* 10.4 10.4 11.8* 10.7*  NEUTROABS 10.7*  --   --   --   --   HGB 9.1* 8.3* 8.2* 8.4* 8.5*  HCT 30.4* 28.1* 28.3* 28.1* 29.1*  MCV 79.4* 78.7* 79.1* 78.9* 78.4*  PLT 518* 406* 386 374 096   Basic Metabolic Panel: Recent Labs  Lab 07/05/20 0912 07/05/20 0929 07/06/20 0836 07/07/20 0129 07/08/20 0443 07/09/20 0156 07/10/20 0258  NA   < >  --  134* 135 134* 133* 132*  K   < >  --  3.4* 3.4* 3.8 4.0 3.9  CL   < >  --  96* 98 96* 96* 95*  CO2   < >  --  25 25 24 23 26   GLUCOSE   < >  --  160* 137* 143* 145* 168*  BUN   < >  --  14 15 17 20 22   CREATININE   < >  --  0.96 0.92 1.04 0.99 1.25*  CALCIUM   < >  --  8.9 8.7* 9.0 9.0 8.9  MG  --  2.1  --   --   --   --   --   PHOS  --  2.9  --   --   --   --   --    < > = values in this interval not displayed.   GFR: Estimated Creatinine Clearance: 62.8 mL/min (A) (by C-G formula based on SCr of 1.25 mg/dL (H)).  Cardiac Enzymes: Recent Labs  Lab 07/06/20 0836  CKTOTAL 26*   CBG: Recent Labs  Lab 07/09/20 0828 07/09/20 1225 07/09/20 1540 07/09/20 2133 07/10/20 0741  GLUCAP 192* 141* 278* 204* 163*    Recent Results (from the past 240 hour(s))  Resp Panel by RT-PCR (Flu A&B, Covid) Nasopharyngeal Swab     Status: None   Collection Time: 07/05/20  9:27 AM   Specimen: Nasopharyngeal Swab; Nasopharyngeal(NP) swabs in vial transport medium  Result Value Ref Range Status   SARS Coronavirus 2 by RT PCR NEGATIVE NEGATIVE Final    Comment: (NOTE) SARS-CoV-2 target nucleic acids are NOT DETECTED.  The SARS-CoV-2 RNA is generally detectable in upper respiratory specimens during the acute phase of infection. The lowest concentration of SARS-CoV-2 viral copies this assay can detect is 138 copies/mL. A negative result does not preclude SARS-Cov-2 infection and should not be  used as the sole basis for treatment or other patient management decisions. A negative result may occur with  improper specimen collection/handling, submission of specimen other than nasopharyngeal swab, presence of viral mutation(s) within the areas targeted by this assay,  and inadequate number of viral copies(<138 copies/mL). A negative result must be combined with clinical observations, patient history, and epidemiological information. The expected result is Negative.  Fact Sheet for Patients:  EntrepreneurPulse.com.au  Fact Sheet for Healthcare Providers:  IncredibleEmployment.be  This test is no t yet approved or cleared by the [place] FDA and  has been authorized for detection and/or diagnosis of SARS-CoV-2 by FDA under an Emergency Use Authorization (EUA). This EUA will remain  in effect (meaning this test can be used) for the duration of the COVID-19 declaration under Section 564(b)(1) of the Act, 21 U.S.C.section 360bbb-3(b)(1), unless the authorization is terminated  or revoked sooner.       Influenza A by PCR NEGATIVE NEGATIVE Final   Influenza B by PCR NEGATIVE NEGATIVE Final    Comment: (NOTE) The Xpert Xpress SARS-CoV-2/FLU/RSV plus assay is intended as an aid in the diagnosis of influenza from Nasopharyngeal swab specimens and should not be used as a sole basis for treatment. Nasal washings and aspirates are unacceptable for Xpert Xpress SARS-CoV-2/FLU/RSV testing.  Fact Sheet for Patients: EntrepreneurPulse.com.au  Fact Sheet for Healthcare Providers: IncredibleEmployment.be  This test is not yet approved or cleared by the [place] FDA and has been authorized for detection and/or diagnosis of SARS-CoV-2 by FDA under an Emergency Use Authorization (EUA). This EUA will remain in effect (meaning this test can be used) for the duration of the COVID-19 declaration under Section  564(b)(1) of the Act, 21 U.S.C. section 360bbb-3(b)(1), unless the authorization is terminated or revoked.  Performed at [facility], [address] [place], [place] [address]          Radiology Studies: No results found.      Scheduled Meds: . amLODipine  5 mg Oral Daily  . aspirin EC  81 mg Oral Daily  . bisoprolol  10 mg Oral Daily  . canagliflozin  100 mg Oral QAC breakfast  . enoxaparin (LOVENOX) injection  40 mg Subcutaneous Q24H  . feeding supplement  237 mL Oral BID BM  . folic acid  1 mg Oral Daily  . furosemide  40 mg Oral Daily  . insulin aspart  0-15 Units Subcutaneous TID WC  . insulin aspart  0-5 Units Subcutaneous QHS  . multivitamin with minerals  1 tablet Oral Daily  . potassium chloride  20 mEq Oral BID  . rosuvastatin  20 mg Oral Daily  . sacubitril-valsartan  1 tablet Oral BID  . sodium chloride flush  3 mL Intravenous Q12H   Continuous Infusions: . sodium chloride       LOS: 5 days       Bonnielee Haff, MD [facility] 07/10/2020, 10:20 AM     Please page though [place], contact charge nurse

## 2020-07-10 NOTE — Progress Notes (Signed)
Progress Note  Patient Name: Larry Garner Date of Encounter: 07/10/2020  [facility] Cardiologist: Jenkins Rouge, MD   Subjective  Doing well. Breathing continues to improved. Still with edema bilaterally but better from prior. Wt down to 174lbs which is below last dry weight  Diuresed well yesterday. [place] bumped slightly to 1.25; BUN 22 Wt down to 174 from 177lbs yesterday  Inpatient Medications    Scheduled Meds: . amLODipine  5 mg Oral Daily  . aspirin EC  81 mg Oral Daily  . bisoprolol  10 mg Oral Daily  . canagliflozin  100 mg Oral QAC breakfast  . enoxaparin (LOVENOX) injection  40 mg Subcutaneous Q24H  . feeding supplement  237 mL Oral BID BM  . folic acid  1 mg Oral Daily  . furosemide  80 mg Intravenous BID  . insulin aspart  0-15 Units Subcutaneous TID WC  . insulin aspart  0-5 Units Subcutaneous QHS  . multivitamin with minerals  1 tablet Oral Daily  . potassium chloride  20 mEq Oral BID  . rosuvastatin  20 mg Oral Daily  . sacubitril-valsartan  1 tablet Oral BID  . sodium chloride flush  3 mL Intravenous Q12H   Continuous Infusions: . sodium chloride     PRN Meds: sodium chloride, acetaminophen, albuterol, ALPRAZolam, nitroGLYCERIN, ondansetron (ZOFRAN) IV, sodium chloride flush, zolpidem   Vital Signs    Vitals:   07/09/20 1541 07/09/20 2115 07/10/20 0610 07/10/20 0621  BP: 117/61 125/70 118/67   Pulse: 80 82 79   Resp: (!) 22 20 18    Temp: 99 F (37.2 C) 100.1 F (37.8 C) 98.3 F (36.8 C)   TempSrc:  Oral Oral   SpO2: 95% 98% 95%   Weight:    79.2 kg  Height:        Intake/Output Summary (Last 24 hours) at 07/10/2020 0740 Last data filed at 07/10/2020 5409 Gross per 24 hour  Intake --  Output 2550 ml  Net -2550 ml   Last 3 Weights 07/10/2020 07/09/2020 07/09/2020  Weight (lbs) 174 lb 9.7 oz 177 lb 12.8 oz 182 lb 5.1 oz  Weight (kg) 79.2 kg 80.65 kg 82.7 kg      Telemetry    NSR - Personally Reviewed  ECG    No new  tracing - Personally Reviewed  Physical Exam   GEN: No acute distress.   Neck: No JVD Cardiac: RRR, no murmurs, rubs, or gallops.  Respiratory: Clear to auscultation bilaterally. GI: Soft, nontender, non-distended  MS: 1+ pitting edema with L>R Neuro:  Nonfocal  Psych: Normal affect   Labs    High Sensitivity Troponin:   Recent Labs  Lab 07/05/20 0929 07/05/20 1148 07/05/20 1446  TROPONINIHS 17 15 17       Chemistry Recent Labs  Lab 07/08/20 0443 07/09/20 0156 07/10/20 0258  NA 134* 133* 132*  K 3.8 4.0 3.9  CL 96* 96* 95*  CO2 24 23 26   GLUCOSE 143* 145* 168*  BUN 17 20 22   CREATININE 1.04 0.99 1.25*  CALCIUM 9.0 9.0 8.9  GFRNONAA >60 >60 >60  ANIONGAP 14 14 11      Hematology Recent Labs  Lab 07/08/20 0443 07/09/20 0156 07/10/20 0258  WBC 10.4 11.8* 10.7*  RBC 3.58* 3.56* 3.71*  HGB 8.2* 8.4* 8.5*  HCT 28.3* 28.1* 29.1*  MCV 79.1* 78.9* 78.4*  MCH 22.9* 23.6* 22.9*  MCHC 29.0* 29.9* 29.2*  RDW 20.5* 20.2* 20.2*  PLT 386 374 367  BNP Recent Labs  Lab 07/05/20 0912  BNP 1,435.5*     DDimer No results for input(s): DDIMER in the last 168 hours.   Radiology    No results found.  Cardiac Studies   Echo 07/06/20 1. Left ventricular ejection fraction, by estimation, is 45 to 50%. The  left ventricle has mildly decreased function. The left ventricle  demonstrates regional wall motion abnormalities (see scoring  diagram/findings for description). There is mild left  ventricular hypertrophy. Left ventricular diastolic parameters are  consistent with Grade II diastolic dysfunction (pseudonormalization).  Elevated left ventricular end-diastolic pressure. There is severe akinesis  of the left ventricular, mid-apical  inferior wall and apical segment.  2. Right ventricular systolic function is low normal. The right  ventricular size is normal. There is moderately elevated pulmonary artery  systolic pressure.  3. Left atrial size was  moderately dilated.  4. The mitral valve is abnormal. Mild mitral valve regurgitation.  5. The aortic valve is tricuspid. Aortic valve regurgitation is trivial.  6. The inferior vena cava is dilated in size with >50% respiratory  variability, suggesting right atrial pressure of 8 mmHg.   Cardiac cath 07/2016  The left ventricular systolic function is normal.  The left ventricular ejection fraction is 45-50% by visual estimate.  There is no mitral valve regurgitation.  Ost 1st Diag to 1st Diag lesion, 65 %stenosed.  Mid LAD lesion, 100 %stenosed.  SVG and is normal in caliber.  SVG.  LIMA.  Dist LAD lesion, 50 %stenosed.  Ost 2nd Diag to 2nd Diag lesion, 100 %stenosed.  Ramus lesion, 100 %stenosed.  Mid RCA lesion, 100 %stenosed.  Prox Graft to Dist Graft lesion, 20 %stenosed.  Origin lesion, 40 %stenosed.   Widely patent previously placed bypass grafts including LIMA to mid LAD, saphenous graft to diagonal, and saphenous vein graft to obtuse marginal #1/ramus intermedius.  Total occlusion of the proximal LAD in stent.  Total occlusion of the obtuse marginal #1/ramus intermedius  60-70% stenosis in the native first diagonal  Total occlusion of the native mid right coronary collateralized by both right to right and left-to-right collaterals. The right coronary is dominant.  Normal left ventricular hemodynamics. LVEF 45-50 %.  Recommendations:   Continue aggressive risk factor modification.  In absence of cardiac symptoms, medical therapy seems most appropriate. Therefore, CTO recanalization will not be pursued..  Coronary Diagrams  Diagnostic Dominance: Right     Patient Profile     64 y.o. male a historyof CADs/p PCI andsubsequent CABGin 2000,DMII, GERD, hyperlipidemia, hypertension, sleep apnea on CPAP,chronic anemia on iron infusions, known pulmonary noduleswho is currently undergoing work-up for anemia, weight loss, and renal mass  who presented with worsening SOB and LE edemafound to have acute on chronic heart failure exacerbation.  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 Currently with NYHA class II symptoms. Patient with known ischemic systolic dysfunction with LVEF 45-50%. Wall motion noted on TTE consistent with known coronary disease most notably in the inferior wall. Do not suspect that acute exacerbation of underlying HF is due to worsening of underlying CAD/ischemia, but rather was likely triggered by significant IV fluid administration in the setting of blood transfusions, iron infusions, and IVF received for renal biopsy.  -Acute exacerbation likely triggered bymultiple iron infusions, blood transfusion and IVF administeredduringrenalbiopsy -TTE with LVEFof45 to 50%(unchanged from cath in 2017), mild LVH, grade 2 diastolic dysfunction, low normal RV function, moderately elevated pulmonary  artery pressure, moderately dilated left atrium, mild MR, trivial AI -Transition to lasix 40mg  PO daily; still goal negative today -Will need IV lasix with blood products/IV infusions in the future -Continue bisoprolol 10mg  daily -Continue entresto 24/26mg  daily -? Change ozimpic to SGLT-2 inihibitor on discharge given significant weight loss -Spironolactone can be added as out-patient pending BP and renal function -Strict I/O's, low Na diet -Needs daily standing weights -Continue compression stockings for lower extremity edema -Edema may be multifactorial in the setting of volume overload and severe protein-calorie malnutrition with recent weight loss--may continue to have some swelling on board despite being near euvolemia. Will continue with lasix and efforts to improve PO intake  #Multivessel CAD s/p CABG Last cath in 2017 with widely patent previously placed bypass grafts including LIMA to mid LAD, saphenous graft to  diagonal, and saphenous vein graft to obtuse marginal #1/ramus intermedius.Known CTO of RCA with left to right collaterals. No current chest pain with low suspicion of ischemia. -Continue ASA, statin -Continue bisoprolol -Continue entresto; tolerating well  #Hyperlipidemia -Continue statin and Lovaza - LDL 34 02/21/20  #Hypertension -Continue amlodipine 5 mg daily, bisoprolol 10 mg daily -Continue entresto  #Diabetes type 2 -SSI - A1C 6.4 -? Change ozimpic to SGLT-2 inihibitor on discharge given significant weight loss  #Iron deficiency anemia #Renal Mass -Currently undergoing extensive work-up for underlying anemia and renal mass -Follow-up with [facility] oncology as scheduled -Will need lasix with IVF/IV blood product administration in the future to prevent acute exacerbation of HF as detailed above      For questions or updates, please contact [facility] Please consult [email or web address] for contact info under        Signed, Freada Bergeron, MD  07/10/2020, 7:40 AM

## 2020-07-11 ENCOUNTER — Other Ambulatory Visit: Payer: Self-pay | Admitting: Medical

## 2020-07-11 DIAGNOSIS — I429 Cardiomyopathy, unspecified: Secondary | ICD-10-CM

## 2020-07-11 LAB — BASIC METABOLIC PANEL
Anion gap: 12 (ref 5–15)
BUN: 21 mg/dL (ref 8–23)
CO2: 24 mmol/L (ref 22–32)
Calcium: 8.9 mg/dL (ref 8.9–10.3)
Chloride: 97 mmol/L — ABNORMAL LOW (ref 98–111)
Creatinine, Ser: 1.19 mg/dL (ref 0.61–1.24)
GFR, Estimated: >60 mL/min (ref >60)
Glucose, Bld: 246 mg/dL — ABNORMAL HIGH (ref 70–99)
Potassium: 4 mmol/L (ref 3.5–5.1)
Sodium: 133 mmol/L — ABNORMAL LOW (ref 135–145)

## 2020-07-11 LAB — CBC
HCT: 27.8 % — ABNORMAL LOW (ref 39.0–52.0)
Hemoglobin: 8.2 g/dL — ABNORMAL LOW (ref 13.0–17.0)
MCH: 23.2 pg — ABNORMAL LOW (ref 26.0–34.0)
MCHC: 29.5 g/dL — ABNORMAL LOW (ref 30.0–36.0)
MCV: 78.8 fL — ABNORMAL LOW (ref 80.0–100.0)
Platelets: 359 10*3/uL (ref 150–400)
RBC: 3.53 MIL/uL — ABNORMAL LOW (ref 4.22–5.81)
RDW: 20.5 % — ABNORMAL HIGH (ref 11.5–15.5)
WBC: 10.3 10*3/uL (ref 4.0–10.5)
nRBC: 0 % (ref 0.0–0.2)

## 2020-07-11 LAB — GLUCOSE, CAPILLARY: Glucose-Capillary: 255 mg/dL — ABNORMAL HIGH (ref 70–99)

## 2020-07-11 MED ORDER — BISOPROLOL FUMARATE 10 MG PO TABS
10.0000 mg | ORAL_TABLET | Freq: Every day | ORAL | 2 refills | Status: DC
Start: 2020-07-12 — End: 2020-10-06

## 2020-07-11 MED ORDER — SACUBITRIL-VALSARTAN 24-26 MG PO TABS
1.0000 | ORAL_TABLET | Freq: Two times a day (BID) | ORAL | 2 refills | Status: DC
Start: 2020-07-11 — End: 2020-09-22

## 2020-07-11 MED ORDER — POTASSIUM CHLORIDE CRYS ER 20 MEQ PO TBCR
20.0000 meq | EXTENDED_RELEASE_TABLET | Freq: Every day | ORAL | 1 refills | Status: DC
Start: 2020-07-11 — End: 2020-08-06

## 2020-07-11 MED ORDER — FUROSEMIDE 40 MG PO TABS
ORAL_TABLET | ORAL | 1 refills | Status: DC
Start: 2020-07-11 — End: 2020-10-08

## 2020-07-11 NOTE — Progress Notes (Signed)
Progress Note  Patient Name: Larry Garner Date of Encounter: 07/11/2020  [facility] Cardiologist: Jenkins Rouge, MD   Subjective   Doing well. Ambulated the hallway 3x yesterday without symptoms. LE edema significantly improved. Ready to go home.  Reds vest reading 37% yesterday (only mildly elevated) and given extra dose of lasix 40mg  PO Cr improved to 1.19, Na improved to 133 Net negative 1.4L; wt down to 173 from 174 yesterday  Inpatient Medications    Scheduled Meds: . amLODipine  5 mg Oral Daily  . aspirin EC  81 mg Oral Daily  . bisoprolol  10 mg Oral Daily  . canagliflozin  100 mg Oral QAC breakfast  . enoxaparin (LOVENOX) injection  40 mg Subcutaneous Q24H  . feeding supplement  237 mL Oral BID BM  . folic acid  1 mg Oral Daily  . furosemide  40 mg Oral BID  . insulin aspart  0-15 Units Subcutaneous TID WC  . insulin aspart  0-5 Units Subcutaneous QHS  . multivitamin with minerals  1 tablet Oral Daily  . potassium chloride  20 mEq Oral BID  . rosuvastatin  20 mg Oral Daily  . sacubitril-valsartan  1 tablet Oral BID  . sodium chloride flush  3 mL Intravenous Q12H   Continuous Infusions: . sodium chloride     PRN Meds: sodium chloride, acetaminophen, albuterol, ALPRAZolam, nitroGLYCERIN, ondansetron (ZOFRAN) IV, sodium chloride flush, zolpidem   Vital Signs    Vitals:   07/10/20 1351 07/10/20 2142 07/11/20 0500 07/11/20 0516  BP: 116/63 114/62  116/64  Pulse: 76 83  77  Resp: 18 16  16   Temp: 99.3 F (37.4 C) 100.3 F (37.9 C)  99.2 F (37.3 C)  TempSrc:  Oral  Oral  SpO2: 98% 95%  96%  Weight:   78.7 kg   Height:        Intake/Output Summary (Last 24 hours) at 07/11/2020 0749 Last data filed at 07/11/2020 0517 Gross per 24 hour  Intake 300 ml  Output 2050 ml  Net -1750 ml   Last 3 Weights 07/11/2020 07/10/2020 07/09/2020  Weight (lbs) 173 lb 8 oz 174 lb 9.7 oz 177 lb 12.8 oz  Weight (kg) 78.7 kg 79.2 kg 80.65 kg      Telemetry    NSR -  Personally Reviewed  ECG    No new tracing - Personally Reviewed  Physical Exam   GEN: No acute distress.   Neck: No JVD Cardiac: RRR, no murmurs, rubs, or gallops.  Respiratory: Clear to auscultation bilaterally. GI: Soft, nontender, non-distended  MS: 1+ edema to the ankle with L>R Neuro:  Nonfocal  Psych: Normal affect   Labs    High Sensitivity Troponin:   Recent Labs  Lab 07/05/20 0929 07/05/20 1148 07/05/20 1446  TROPONINIHS 17 15 17       Chemistry Recent Labs  Lab 07/09/20 0156 07/10/20 0258 07/11/20 0331  NA 133* 132* 133*  K 4.0 3.9 4.0  CL 96* 95* 97*  CO2 23 26 24   GLUCOSE 145* 168* 246*  BUN 20 22 21   CREATININE 0.99 1.25* 1.19  CALCIUM 9.0 8.9 8.9  PROT  --  6.9  --   ALBUMIN  --  2.1*  --   AST  --  18  --   ALT  --  15  --   ALKPHOS  --  93  --   BILITOT  --  0.7  --   GFRNONAA >60 >60 >60  ANIONGAP 14 11 12      Hematology Recent Labs  Lab 07/09/20 0156 07/10/20 0258 07/11/20 0331  WBC 11.8* 10.7* 10.3  RBC 3.56* 3.71* 3.53*  HGB 8.4* 8.5* 8.2*  HCT 28.1* 29.1* 27.8*  MCV 78.9* 78.4* 78.8*  MCH 23.6* 22.9* 23.2*  MCHC 29.9* 29.2* 29.5*  RDW 20.2* 20.2* 20.5*  PLT 374 367 359    BNP Recent Labs  Lab 07/05/20 0912  BNP 1,435.5*     DDimer No results for input(s): DDIMER in the last 168 hours.   Radiology    No results found.  Cardiac Studies   Echo 07/06/20 1. Left ventricular ejection fraction, by estimation, is 45 to 50%. The  left ventricle has mildly decreased function. The left ventricle  demonstrates regional wall motion abnormalities (see scoring  diagram/findings for description). There is mild left  ventricular hypertrophy. Left ventricular diastolic parameters are  consistent with Grade II diastolic dysfunction (pseudonormalization).  Elevated left ventricular end-diastolic pressure. There is severe akinesis  of the left ventricular, mid-apical  inferior wall and apical segment.  2. Right  ventricular systolic function is low normal. The right  ventricular size is normal. There is moderately elevated pulmonary artery  systolic pressure.  3. Left atrial size was moderately dilated.  4. The mitral valve is abnormal. Mild mitral valve regurgitation.  5. The aortic valve is tricuspid. Aortic valve regurgitation is trivial.  6. The inferior vena cava is dilated in size with >50% respiratory  variability, suggesting right atrial pressure of 8 mmHg.   Cardiac cath 07/2016  The left ventricular systolic function is normal.  The left ventricular ejection fraction is 45-50% by visual estimate.  There is no mitral valve regurgitation.  Ost 1st Diag to 1st Diag lesion, 65 %stenosed.  Mid LAD lesion, 100 %stenosed.  SVG and is normal in caliber.  SVG.  LIMA.  Dist LAD lesion, 50 %stenosed.  Ost 2nd Diag to 2nd Diag lesion, 100 %stenosed.  Ramus lesion, 100 %stenosed.  Mid RCA lesion, 100 %stenosed.  Prox Graft to Dist Graft lesion, 20 %stenosed.  Origin lesion, 40 %stenosed.   Widely patent previously placed bypass grafts including LIMA to mid LAD, saphenous graft to diagonal, and saphenous vein graft to obtuse marginal #1/ramus intermedius.  Total occlusion of the proximal LAD in stent.  Total occlusion of the obtuse marginal #1/ramus intermedius  60-70% stenosis in the native first diagonal  Total occlusion of the native mid right coronary collateralized by both right to right and left-to-right collaterals. The right coronary is dominant.  Normal left ventricular hemodynamics. LVEF 45-50 %.  Recommendations:   Continue aggressive risk factor modification.  In absence of cardiac symptoms, medical therapy seems most appropriate. Therefore, CTO recanalization will not be pursued..  Coronary Diagrams  Diagnostic Dominance: Right     Patient Profile     64 y.o. male witha historyof CADs/p PCI andsubsequent CABGin 2000,DMII, GERD,  hyperlipidemia, hypertension, sleep apnea on CPAP,chronic anemia on iron infusions, known pulmonary noduleswho is currently undergoing work-up for anemia, weight loss, and renal mass who presented with worsening SOB and LE edemafound to have acute on chronic heart failure exacerbation. Now improving with diuresis.  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 Currently with NYHA class II symptoms. Patient with known ischemic systolic dysfunction with LVEF 45-50%. Wall motion noted on TTE consistent with known coronary disease most notably in the inferior wall. Do not suspect that  acute exacerbation of underlying HF is due to worsening of underlying CAD/ischemia, but rather was likely triggered by significant IV fluid administration in the setting of blood transfusions, iron infusions, and IVF received for renal biopsy.  -Acute exacerbation likely triggered bymultiple iron infusions, blood transfusion and IVF administeredduringrenalbiopsy -TTE with [id number] to 50%(unchanged from cath in 2017), mild LVH, grade 2 diastolic dysfunction, low normal RV function, moderately elevated pulmonary artery pressure, moderately dilated left atrium, mild MR, trivial AI -Reds vest reading 37% yesterday (normal 20-35%) consistent with only mild elevation--given additional dose of lasix 40mg  PO yesterday evening -Plan for lasix 40mg  PO BID x 3days and then 40mg  PO daily thereafter on discharge -Repeat BMET within 5-7days of discharge -Will need lasix with blood products/IV infusions in the future -Continue bisoprolol 10mg  daily -Continue entresto 24/26mg  daily -Resume ozimpic on discharge -Spironolactone can be added as out-patient pending BP and renal function -Strict I/O's, low Na diet -Monitor weights at home -Continue compression stockings for lower extremity edema -Edema may be multifactorial in the setting of volume  overload and severe protein-calorie malnutrition with recent weight loss--may continue to have some swelling on board despite being near euvolemia  #Multivessel CAD s/p CABG Last cath in 2017 with widely patent previously placed bypass grafts including LIMA to mid LAD, saphenous graft to diagonal, and saphenous vein graft to obtuse marginal #1/ramus intermedius.Known CTO of RCA with left to right collaterals. No current chest pain with low suspicion of ischemia. -Continue ASA, statin -Continue bisoprolol -Continue entresto; tolerating well  #Hyperlipidemia -Continue statin and Lovaza - LDL 34 02/21/20  #Hypertension -Continue amlodipine 5 mg daily, bisoprolol 10 mg daily -Continue entresto -Stop combination bisoprolol-HCTZ at home as will be on lasix as diuretic  #Diabetes type 2 -SSI - A1C 6.4 -Resume ozimpic at discharge  #Iron deficiency anemia #Renal Mass -Currently undergoing extensive work-up for underlying anemia and renal mass -Follow-up with [facility] oncology as scheduled -Will need lasix with IVF/IV blood product administration in the future to prevent acute exacerbation of HF as detailed above  Okay to discharge home from CV perspective. Will plan for: -Lasix 40mg  PO BID x 3 days (day 1 today) followed by 40mg  daily thereafter -Stop combination bisoprolol-HCTZ and transition to bisoprolol 10mg  only at discharge -Continue entresto 24/26mg  BID -Will arrange Cardiology follow-up within 7-10 days -Repeat BMET within 5-7 days -Need additional lasix on days he received IVF or blood preducts     For questions or updates, please contact [facility] Please consult [email or web address] for contact info under        Signed, Freada Bergeron, MD  07/11/2020, 7:49 AM

## 2020-07-11 NOTE — Discharge Summary (Signed)
[facility]  Physician Discharge Summary   Patient ID: Larry Garner MRN: [id number] DOB/AGE: 65-Oct-1957 65 y.o.  Admit date: 07/05/2020 Discharge date: 07/11/2020  PCP: Marin Olp, MD  DISCHARGE DIAGNOSES:  Acute on chronic systolic and diastolic CHF Ischemic cardiomyopathy Essential hypertension Diabetes mellitus type 2 Renal mass Iron deficiency anemia Hyponatremia  RECOMMENDATIONS FOR OUTPATIENT FOLLOW UP: 1. Please be sure to follow-up with your providers at [facility] 2. Cardiology will arrange outpatient follow-up    Home Health: None Equipment/Devices: None  CODE STATUS: Full code  DISCHARGE CONDITION: fair  Diet recommendation: Modified carbohydrate  INITIAL HISTORY: Larry Garner is a 64 y.o. M with CAD s/p CABG 2000, HTN, DM, chronic IDA on iron infusions, known pulmonary nodule followed by Dr. Elsworth Soho and Dr. Roxan Hockey and renal mass s/p recent renal mass biopsy who presented with few days progressive SOB and pedal edema.  In the ER, BNP >1000, CXR with interstitial edema and cardiomegaly.  Admitted and started on IV Lasix.  Consultations:  Cardiology  Procedures:  None   HOSPITAL COURSE:   Acute on chronic diastolic and systolic CHF/Ischemic cardiomyopathy Based on echocardiogram done on 11/28 his EF is 45 to 50%.  He also has grade 2 diastolic dysfunction.   Cardiology was consulted.  Patient started on intravenous furosemide.  He has diuresed well.  Continues to have lower extremity edema although the reason for his pedal edema could be multifactorial.  Patient was subsequently transitioned to oral furosemide.  He was also started on Entresto.  Bisoprolol was continued.  Plan is to discharge him on these medications.  Cardiology will arrange for prior authorization for the [facility]. Patient is on SGLT inhibitor at home.  Spironolactone to be considered in the outpatient setting.    Ischemic heart disease secondary  prevention Essential hypertension Stable.  Continue home medications.  Discontinue hydrochlorothiazide since he will not be on furosemide.  Diabetes mellitus type 2  HbA1c 6.4.    Continue home medication  Iron deficiency anemia Hemoglobin stable for the most part.  No evidence of overt bleeding.    Folic acid deficiency Supplement folate  Pulmonary nodule Renal mass Patient apparently underwent left renal biopsy at [facility].  Apparently is positive for malignancy.  Patient has an appointment with urology next week.    B symptoms The patient has been having the symptoms (night sweats, intermittent fevers) for many months.  Low suspicion for infection.  Hyponatremia Stable.  Continue to monitor.  Severe protein calorie malnutrition Nutrition Problem: Severe Malnutrition Etiology: chronic illness (DM with med change, renal mass)  Signs/Symptoms: moderate muscle depletion, severe muscle depletion, energy intake < or equal to 75% for > or equal to 1 month, percent weight loss  Interventions: Ensure Enlive (each supplement provides 350kcal and 20 grams of protein)  Overall stable.  Okay for discharge home today.  Discussed with cardiology.  Discussed with patient.   PERTINENT LABS:  The results of significant diagnostics from this hospitalization (including imaging, microbiology, ancillary and laboratory) are listed below for reference.    Microbiology: Recent Results (from the past 240 hour(s))  Resp Panel by RT-PCR (Flu A&B, Covid) Nasopharyngeal Swab     Status: None   Collection Time: 07/05/20  9:27 AM   Specimen: Nasopharyngeal Swab; Nasopharyngeal(NP) swabs in vial transport medium  Result Value Ref Range Status   SARS Coronavirus 2 by RT PCR NEGATIVE NEGATIVE Final    Comment: (NOTE) SARS-CoV-2 target nucleic acids are NOT DETECTED.  The SARS-CoV-2  RNA is generally detectable in upper respiratory specimens during the acute phase of infection. The  lowest concentration of SARS-CoV-2 viral copies this assay can detect is 138 copies/mL. A negative result does not preclude SARS-Cov-2 infection and should not be used as the sole basis for treatment or other patient management decisions. A negative result may occur with  improper specimen collection/handling, submission of specimen other than nasopharyngeal swab, presence of viral mutation(s) within the areas targeted by this assay, and inadequate number of viral copies(<138 copies/mL). A negative result must be combined with clinical observations, patient history, and epidemiological information. The expected result is Negative.  Fact Sheet for Patients:  EntrepreneurPulse.com.au  Fact Sheet for Healthcare Providers:  IncredibleEmployment.be  This test is no t yet approved or cleared by the [place] FDA and  has been authorized for detection and/or diagnosis of SARS-CoV-2 by FDA under an Emergency Use Authorization (EUA). This EUA will remain  in effect (meaning this test can be used) for the duration of the COVID-19 declaration under Section 564(b)(1) of the Act, 21 U.S.C.section 360bbb-3(b)(1), unless the authorization is terminated  or revoked sooner.       Influenza A by PCR NEGATIVE NEGATIVE Final   Influenza B by PCR NEGATIVE NEGATIVE Final    Comment: (NOTE) The Xpert Xpress SARS-CoV-2/FLU/RSV plus assay is intended as an aid in the diagnosis of influenza from Nasopharyngeal swab specimens and should not be used as a sole basis for treatment. Nasal washings and aspirates are unacceptable for Xpert Xpress SARS-CoV-2/FLU/RSV testing.  Fact Sheet for Patients: EntrepreneurPulse.com.au  Fact Sheet for Healthcare Providers: IncredibleEmployment.be  This test is not yet approved or cleared by the [place] FDA and has been authorized for detection and/or diagnosis of SARS-CoV-2 by FDA under  an Emergency Use Authorization (EUA). This EUA will remain in effect (meaning this test can be used) for the duration of the COVID-19 declaration under Section 564(b)(1) of the Act, 21 U.S.C. section 360bbb-3(b)(1), unless the authorization is terminated or revoked.  Performed at [facility], [address] [place], [place] [address]      Labs:     Basic Metabolic Panel: Recent Labs  Lab 07/05/20 [phone] 07/06/20 0836 07/07/20 0129 07/08/20 0443 07/09/20 0156 07/10/20 0258 07/11/20 0331  NA  --    < > 135 134* 133* 132* 133*  K  --    < > 3.4* 3.8 4.0 3.9 4.0  CL  --    < > 98 96* 96* 95* 97*  CO2  --    < > 25 24 23 26 24   GLUCOSE  --    < > 137* 143* 145* 168* 246*  BUN  --    < > 15 17 20 22 21   CREATININE  --    < > 0.92 1.04 0.99 1.25* 1.19  CALCIUM  --    < > 8.7* 9.0 9.0 8.9 8.9  MG 2.1  --   --   --   --   --   --   PHOS 2.9  --   --   --   --   --   --    < > = values in this interval not displayed.   Liver Function Tests: Recent Labs  Lab 07/10/20 0258  AST 18  ALT 15  ALKPHOS 93  BILITOT 0.7  PROT 6.9  ALBUMIN 2.1*   CBC: Recent Labs  Lab 07/05/20 0912 07/05/20 0912 07/07/20 0129 07/08/20 0443  07/09/20 0156 07/10/20 0258 07/11/20 0331  WBC 13.2*   < > 10.4 10.4 11.8* 10.7* 10.3  NEUTROABS 10.7*  --   --   --   --   --   --   HGB 9.1*   < > 8.3* 8.2* 8.4* 8.5* 8.2*  HCT 30.4*   < > 28.1* 28.3* 28.1* 29.1* 27.8*  MCV 79.4*   < > 78.7* 79.1* 78.9* 78.4* 78.8*  PLT 518*   < > 406* 386 374 367 359   < > = values in this interval not displayed.   Cardiac Enzymes: Recent Labs  Lab 07/06/20 0836  CKTOTAL 26*   BNP: BNP (last 3 results) Recent Labs    07/05/20 0912  BNP 1,435.5*     CBG: Recent Labs  Lab 07/10/20 0741 07/10/20 1144 07/10/20 1646 07/10/20 2047 07/11/20 0737  GLUCAP 163* 174* 162* 180* 255*     IMAGING STUDIES DG Chest 2 View  Result Date: 07/05/2020 CLINICAL DATA:  Shortness of breath EXAM:  CHEST - 2 VIEW COMPARISON:  PET-CT May 20, 2020 FINDINGS: Previously noted nodular lesion in left lower lobe is apparent on the lateral view measuring 3.3 x 3.1 cm. It is difficult to discern on the frontal view. There is cardiomegaly with mild pulmonary venous hypertension. Mild interstitial edema. Atelectasis in the bases. No consolidation appreciable. Status post coronary artery bypass grafting. No appreciable bone lesions. No pneumothorax. IMPRESSION: The nodular opacity in the left lower lobe seen on recent PET study not well seen on frontal view. It is appreciable on the lateral view measuring 3.3 x 3.1 cm. There is cardiomegaly with a degree of pulmonary vascular congestion interstitial edema, suspicious for a degree of congestive heart failure. Bibasilar atelectasis. No consolidation. Status post coronary artery bypass grafting. Electronically Signed   By: Lowella Grip III M.D.   On: 07/05/2020 10:00   US Venous Img Lower Bilateral  Result Date: 07/05/2020 CLINICAL DATA:  Bilateral leg swelling EXAM: BILATERAL LOWER EXTREMITY VENOUS DOPPLER ULTRASOUND TECHNIQUE: Gray-scale sonography with graded compression, as well as color Doppler and duplex ultrasound were performed to evaluate the lower extremity deep venous systems from the level of the common femoral vein and including the common femoral, femoral, profunda femoral, popliteal and calf veins including the posterior tibial, peroneal and gastrocnemius veins when visible. The superficial great saphenous vein was also interrogated. Spectral Doppler was utilized to evaluate flow at rest and with distal augmentation maneuvers in the common femoral, femoral and popliteal veins. COMPARISON:  None. FINDINGS: RIGHT LOWER EXTREMITY Common Femoral Vein: No evidence of thrombus. Normal compressibility, respiratory phasicity and response to augmentation. Saphenofemoral Junction: No evidence of thrombus. Normal compressibility and flow on color Doppler  imaging. Profunda Femoral Vein: No evidence of thrombus. Normal compressibility and flow on color Doppler imaging. Femoral Vein: No evidence of thrombus. Normal compressibility, respiratory phasicity and response to augmentation. Popliteal Vein: No evidence of thrombus. Normal compressibility, respiratory phasicity and response to augmentation. Calf Veins: No evidence of thrombus. Normal compressibility and flow on color Doppler imaging. Superficial Great Saphenous Vein: No evidence of thrombus. Normal compressibility. Venous Reflux:  None. Other Findings:  None. LEFT LOWER EXTREMITY Common Femoral Vein: No evidence of thrombus. Normal compressibility, respiratory phasicity and response to augmentation. Saphenofemoral Junction: No evidence of thrombus. Normal compressibility and flow on color Doppler imaging. Profunda Femoral Vein: No evidence of thrombus. Normal compressibility and flow on color Doppler imaging. Femoral Vein: No evidence of thrombus. Normal compressibility, respiratory phasicity and  response to augmentation. Popliteal Vein: No evidence of thrombus. Normal compressibility, respiratory phasicity and response to augmentation. Calf Veins: No evidence of thrombus. Normal compressibility and flow on color Doppler imaging. Superficial Great Saphenous Vein: No evidence of thrombus. Normal compressibility. Venous Reflux:  None. Other Findings:  None. IMPRESSION: No evidence of deep venous thrombosis in either lower extremity. Electronically Signed   By: Davina Poke D.O.   On: 07/05/2020 10:42   DG CHEST PORT 1 VIEW  Result Date: 07/06/2020 CLINICAL DATA:  Shortness of breath and fever EXAM: PORTABLE CHEST 1 VIEW COMPARISON:  Film from the previous day FINDINGS: Cardiac shadow is stable. Postsurgical changes are again seen. Aortic calcifications are noted. The known left retro hilar nodular density is partially visualized on today's exam stable in appearance from the prior study. No new focal  infiltrate is seen. Mild left basilar atelectasis is noted. Mild vascular congestion remains without edema. IMPRESSION: Stable left retro hilar nodule. Mild left basilar atelectasis. Electronically Signed   By: Inez Catalina M.D.   On: 07/06/2020 19:42   ECHOCARDIOGRAM COMPLETE  Result Date: 07/06/2020    ECHOCARDIOGRAM REPORT   Patient Name:   ILIYA SPIVACK Date of Exam: 07/06/2020 Medical Rec #:  [id number]      Height:       71.0 in Accession #:    [id number]     Weight:       171.1 lb Date of Birth:  Dec 01, 1955      BSA:          1.973 m Patient Age:    69 years       BP:           132/75 mmHg Patient Gender: M              HR:           81 bpm. Exam Location:  Inpatient Procedure: 2D Echo, Cardiac Doppler and Color Doppler Indications:    CHF-Acute Systolic 503.54 / S56.81  History:        Patient has no prior history of Echocardiogram examinations.                 CAD; Risk Factors:Hypertension, Diabetes, Dyslipidemia and                 Former Smoker. OSA (obstructive sleep apnea).  Sonographer:    Alvino Chapel RCS Referring Phys: [place]  1. Left ventricular ejection fraction, by estimation, is 45 to 50%. The left ventricle has mildly decreased function. The left ventricle demonstrates regional wall motion abnormalities (see scoring diagram/findings for description). There is mild left ventricular hypertrophy. Left ventricular diastolic parameters are consistent with Grade II diastolic dysfunction (pseudonormalization). Elevated left ventricular end-diastolic pressure. There is severe akinesis of the left ventricular, mid-apical inferior wall and apical segment.  2. Right ventricular systolic function is low normal. The right ventricular size is normal. There is moderately elevated pulmonary artery systolic pressure.  3. Left atrial size was moderately dilated.  4. The mitral valve is abnormal. Mild mitral valve regurgitation.  5. The aortic valve is tricuspid. Aortic valve  regurgitation is trivial.  6. The inferior vena cava is dilated in size with >50% respiratory variability, suggesting right atrial pressure of 8 mmHg. FINDINGS  Left Ventricle: Left ventricular ejection fraction, by estimation, is 45 to 50%. The left ventricle has mildly decreased function. The left ventricle demonstrates regional wall motion abnormalities. Severe akinesis of the left ventricular,  mid-apical inferior wall and apical segment. The left ventricular internal cavity size was normal in size. There is mild left ventricular hypertrophy. Left ventricular diastolic parameters are consistent with Grade II diastolic dysfunction (pseudonormalization). Elevated left ventricular end-diastolic pressure. Right Ventricle: The right ventricular size is normal. No increase in right ventricular wall thickness. Right ventricular systolic function is low normal. There is moderately elevated pulmonary artery systolic pressure. The tricuspid regurgitant velocity  is 3.15 m/s, and with an assumed right atrial pressure of 8 mmHg, the estimated right ventricular systolic pressure is 07.1 mmHg. Left Atrium: Left atrial size was moderately dilated. Right Atrium: Right atrial size was normal in size. Pericardium: There is no evidence of pericardial effusion. Mitral Valve: The mitral valve is abnormal. There is mild thickening of the mitral valve leaflet(s). Mild mitral valve regurgitation. Tricuspid Valve: The tricuspid valve is grossly normal. Tricuspid valve regurgitation is mild. Aortic Valve: The aortic valve is tricuspid. Aortic valve regurgitation is trivial. Aortic regurgitation PHT measures 465 msec. Pulmonic Valve: The pulmonic valve was normal in structure. Pulmonic valve regurgitation is not visualized. Aorta: The aortic root and ascending aorta are structurally normal, with no evidence of dilitation. Venous: The inferior vena cava is dilated in size with greater than 50% respiratory variability, suggesting right  atrial pressure of 8 mmHg. IAS/Shunts: No atrial level shunt detected by color flow Doppler.  LEFT VENTRICLE PLAX 2D LVIDd:         5.00 cm  Diastology LVIDs:         3.70 cm  LV e' medial:    5.22 cm/s LV PW:         1.10 cm  LV E/e' medial:  26.2 LV IVS:        1.10 cm  LV e' lateral:   9.68 cm/s LVOT diam:     2.00 cm  LV E/e' lateral: 14.2 LV SV:         67 LV SV Index:   34 LVOT Area:     3.14 cm  RIGHT VENTRICLE RV S prime:     9.90 cm/s TAPSE (M-mode): 1.5 cm LEFT ATRIUM             Index       RIGHT ATRIUM           Index LA diam:        4.70 cm 2.38 cm/m  RA Area:     17.80 cm LA Vol (A2C):   96.1 ml 48.70 ml/m RA Volume:   46.20 ml  23.41 ml/m LA Vol (A4C):   74.7 ml 37.86 ml/m LA Biplane Vol: 87.6 ml 44.40 ml/m  AORTIC VALVE LVOT Vmax:   118.00 cm/s LVOT Vmean:  82.300 cm/s LVOT VTI:    0.213 m AI PHT:      465 msec  AORTA Ao Root diam: 3.30 cm MITRAL VALVE                TRICUSPID VALVE MV Area (PHT): 5.97 cm     TR Peak grad:   39.7 mmHg MV Decel Time: 127 msec     TR Vmax:        315.00 cm/s MR Peak grad: 82.1 mmHg MR Mean grad: 57.0 mmHg     SHUNTS MR Vmax:      453.00 cm/s   Systemic VTI:  0.21 m MR Vmean:     358.0 cm/s    Systemic Diam: 2.00 cm MV E velocity: 137.00 cm/s MV A velocity:  67.80 cm/s MV E/A ratio:  2.02 Lyman Bishop MD Electronically signed by Lyman Bishop MD Signature Date/Time: 07/06/2020/5:11:10 PM    Final     DISCHARGE EXAMINATION: Vitals:   07/10/20 1351 07/10/20 2142 07/11/20 0500 07/11/20 0516  BP: 116/63 114/62  116/64  Pulse: 76 83  77  Resp: 18 16  16   Temp: 99.3 F (37.4 C) 100.3 F (37.9 C)  99.2 F (37.3 C)  TempSrc:  Oral  Oral  SpO2: 98% 95%  96%  Weight:   78.7 kg   Height:       General appearance: Awake alert.  In no distress Resp: Clear to auscultation bilaterally.  Normal effort Cardio: S1-S2 is normal regular.  No S3-S4.  No rubs murmurs or bruit GI: Abdomen is soft.  Nontender nondistended.  Bowel sounds are present normal.  No  masses organomegaly Extremities: Improved edema bilateral lower extremities Neurologic: Alert and oriented x3.  No focal neurological deficits.    DISPOSITION: Home  Discharge Instructions    (HEART FAILURE PATIENTS) Call MD:  Anytime you have any of the following symptoms: 1) 3 pound weight gain in 24 hours or 5 pounds in 1 week 2) shortness of breath, with or without a dry hacking cough 3) swelling in the hands, feet or stomach 4) if you have to sleep on extra pillows at night in order to breathe.   Complete by: As directed    Call MD for:  difficulty breathing, headache or visual disturbances   Complete by: As directed    Call MD for:  extreme fatigue   Complete by: As directed    Call MD for:  persistant dizziness or light-headedness   Complete by: As directed    Call MD for:  persistant nausea and vomiting   Complete by: As directed    Call MD for:  severe uncontrolled pain   Complete by: As directed    Call MD for:  temperature >100.4   Complete by: As directed    Diet - low sodium heart healthy   Complete by: As directed    Discharge instructions   Complete by: As directed    Please take your medications as prescribed.  Please follow-up with your cardiologist as will be scheduled.  Seek attention immediately if your symptoms worsen.  You were cared for by a hospitalist during your hospital stay. If you have any questions about your discharge medications or the care you received while you were in the hospital after you are discharged, you can call the unit and asked to speak with the hospitalist on call if the hospitalist that took care of you is not available. Once you are discharged, your primary care physician will handle any further medical issues. Please note that NO REFILLS for any discharge medications will be authorized once you are discharged, as it is imperative that you return to your primary care physician (or establish a relationship with a primary care physician if you  do not have one) for your aftercare needs so that they can reassess your need for medications and monitor your lab values. If you do not have a primary care physician, you can call [phone] for a physician referral.   Increase activity slowly   Complete by: As directed         Allergies as of 07/11/2020      Reactions   Morphine Sulfate Nausea And Vomiting, Swelling      Medication List    STOP taking these  medications   Advair HFA 45-21 MCG/ACT inhaler Generic drug: fluticasone-salmeterol   bisoprolol-hydrochlorothiazide 10-6.25 MG tablet Commonly known as: ZIAC   doxycycline 100 MG tablet Commonly known as: VIBRA-TABS   losartan 100 MG tablet Commonly known as: COZAAR     TAKE these medications   albuterol 108 (90 Base) MCG/ACT inhaler Commonly known as: VENTOLIN HFA Inhale 2 puffs into the lungs every 6 (six) hours as needed for wheezing or shortness of breath.   ALPRAZolam 0.5 MG tablet Commonly known as: XANAX Take 1 tablet (0.5 mg total) by mouth every 6 (six) hours as needed. What changed: reasons to take this   amLODipine 5 MG tablet Commonly known as: NORVASC TAKE 1 TABLET BY MOUTH EVERY DAY   aspirin EC 81 MG tablet Take 1 tablet (81 mg total) by mouth daily.   Azelastine-Fluticasone 137-50 MCG/ACT Susp Place 1 spray into the nose daily as needed (allergies).   bisoprolol 10 MG tablet Commonly known as: ZEBETA Take 1 tablet (10 mg total) by mouth daily. Start taking on: July 12, 2020   budesonide-formoterol 160-4.5 MCG/ACT inhaler Commonly known as: Symbicort Inhale 2 puffs into the lungs in the morning and at bedtime. What changed:   when to take this  reasons to take this   famciclovir 500 MG tablet Commonly known as: FAMVIR TAKE 3 TABLETS BY MOUTH EVERY DAY AS NEEDED AT FIRST SIGN OF OUTBREAK OF FEVER BLISTER   folic acid 026 MCG tablet Commonly known as: FOLVITE Take 1,600 mcg by mouth daily.   furosemide 40 MG tablet Commonly  known as: LASIX Take 40mg  twice daily for 3 days followed by once daily   glimepiride 2 MG tablet Commonly known as: AMARYL TAKE 1 TABLET (2 MG TOTAL) BY MOUTH DAILY BEFORE BREAKFAST. What changed: when to take this   glucose blood test strip Commonly known as: Bayer Contour Next Test Use to test blood sugars daily. Dx: E11.9   Invokamet [phone] MG Tabs Generic drug: Canagliflozin-metFORMIN HCl Take 1 tablet by mouth 2 (two) times daily.   multivitamin tablet Take 1 tablet by mouth daily.   nitroGLYCERIN 0.4 MG SL tablet Commonly known as: NITROSTAT PLACE 1 TABLET UNDER THE TONGUE EVERY 5 MINUTES AS NEEDED FOR CHEST PAIN. What changed: See the new instructions.   omega-3 acid ethyl esters 1 g capsule Commonly known as: LOVAZA Take 1 capsule (1 g total) by mouth 3 (three) times daily. What changed: when to take this   oxymetazoline 0.05 % nasal spray Commonly known as: AFRIN Place 1 spray into both nostrils 2 (two) times daily as needed for congestion.   Ozempic (0.25 or 0.5 MG/DOSE) 2 MG/1.5ML Sopn Generic drug: Semaglutide(0.25 or 0.5MG /DOS) Inject 0.25 mg as directed once a week for 28 days, THEN 0.5 mg once a week for 14 days. Start taking on: June 20, 2020 What changed: See the new instructions.   potassium chloride SA 20 MEQ tablet Commonly known as: KLOR-CON Take 1 tablet (20 mEq total) by mouth daily.   rosuvastatin 20 MG tablet Commonly known as: CRESTOR TAKE 1 TABLET BY MOUTH DAILY   sacubitril-valsartan 24-26 MG Commonly known as: ENTRESTO Take 1 tablet by mouth 2 (two) times daily.   zaleplon 10 MG capsule Commonly known as: SONATA TAKE 1 CAPSULE (10 MG TOTAL) BY MOUTH AT BEDTIME AS NEEDED. FOR SLEEP What changed:   reasons to take this  additional instructions         Follow-up Information    [name]  O, MD. Schedule an appointment as soon as possible for a visit in 1 week(s).   Specialty: Family Medicine Contact  information: [address] [place] [address] [phone]        Erlene Quan, PA-C Follow up on 07/22/2020.   Specialties: Cardiology, Radiology Contact information: [address] [place] [address] [phone]        [place] [facility] Follow up on 07/18/2020.   Specialty: Cardiology Why: please go for blood work 7:30-4:30 Contact information: [place]: 35 minutes  [place]  [facility] Pager on [email or web address]  07/11/2020, 12:48 PM

## 2020-07-14 ENCOUNTER — Telehealth: Payer: Self-pay

## 2020-07-14 DIAGNOSIS — D4102 Neoplasm of uncertain behavior of left kidney: Secondary | ICD-10-CM | POA: Diagnosis not present

## 2020-07-14 DIAGNOSIS — C642 Malignant neoplasm of left kidney, except renal pelvis: Secondary | ICD-10-CM | POA: Diagnosis not present

## 2020-07-14 NOTE — Telephone Encounter (Cosign Needed)
Transition Care Management Follow-up Telephone Call  Date of discharge and from where: 07/11/20 [place] [facility]  How have you been since you were released from the hospital? Doing better happy to be home  Any questions or concerns? No  Items Reviewed:  Did the pt receive and understand the discharge instructions provided? Yes   Medications obtained and verified? Yes   Other? No   Any new allergies since your discharge? No   Dietary orders reviewed? Yes  Do you have support at home? Yes   Home Care and Equipment/Supplies: Were home health services ordered? not applicable If so, what is the name of the agency?   Has the agency set up a time to come to the patient's home? not applicable Were any new equipment or medical supplies ordered?  No What is the name of the medical supply agency?  Were you able to get the supplies/equipment? not applicable Do you have any questions related to the use of the equipment or supplies? No  Functional Questionnaire: (I = Independent and D = Dependent) ADLs: I  Bathing/Dressing- I  Meal Prep- I  Eating- I  Maintaining continence- I  Transferring/Ambulation- I  Managing Meds- I  Follow up appointments reviewed:   PCP Hospital f/u appt confirmed? No  Pt will call for HFU was on his way to appt   Are transportation arrangements needed? No   If their condition worsens, is the pt aware to call PCP or go to the Emergency Dept.? Yes  Was the patient provided with contact information for the PCP's office or ED? Yes  Was to pt encouraged to call back with questions or concerns? Yes

## 2020-07-14 NOTE — Telephone Encounter (Signed)
Noted thanks. Can use same day slot if needed for hospital follow up.

## 2020-07-15 NOTE — Telephone Encounter (Signed)
Spoke with Pt. Please schedule for Thursday at 3:40.

## 2020-07-15 NOTE — Telephone Encounter (Signed)
Patient has been scheduled

## 2020-07-16 NOTE — Progress Notes (Deleted)
Phone [phone]   Subjective:  Larry Garner is a 64 y.o. year old very pleasant male patient who presents for transitional care management and hospital follow up for CHF. Patient was hospitalized from 11/27 to 12/3. A TCM phone call was completed on 07/14/20. Medical complexity ***   ***   See problem oriented charting as well  Past Medical History-  Patient Active Problem List   Diagnosis Date Noted  . Protein-calorie malnutrition, severe 07/08/2020  . Acute respiratory failure with hypoxia ([place]) 07/05/2020  . Iron deficiency anemia due to chronic blood loss 06/19/2020  . OSA (obstructive sleep apnea) 01/09/2018  . Pulmonary nodule, left 03/15/2017  . Former smoker 11/04/2014  . Insomnia 05/27/2014  . Plantar wart of left foot 11/27/2010  . Type 2 diabetes mellitus with other specified complication ([place]) 09/81/1914  . PSA, INCREASED 09/15/2009  . Diverticulitis of colon 05/08/2009  . Asthma, moderate persistent 06/19/2008  . ANKLE PAIN, CHRONIC 06/05/2007  . Allergic rhinitis 05/17/2007  . ERECTILE DYSFUNCTION, SECONDARY TO MEDICATION 05/17/2007  . Hyperlipidemia 04/06/2007  . Anxiety state 04/06/2007  . Essential hypertension 04/06/2007  . Coronary artery disease involving native coronary artery of native heart without angina pectoris 04/06/2007    Medications- reviewed and updated  A medical reconciliation was performed comparing current medicines to hospital discharge medications. Current Outpatient Medications  Medication Sig Dispense Refill  . albuterol (VENTOLIN HFA) 108 (90 Base) MCG/ACT inhaler Inhale 2 puffs into the lungs every 6 (six) hours as needed for wheezing or shortness of breath. 18 g 11  . ALPRAZolam (XANAX) 0.5 MG tablet Take 1 tablet (0.5 mg total) by mouth every 6 (six) hours as needed. (Patient taking differently: Take 0.5 mg by mouth every 6 (six) hours as needed for anxiety. ) 30 tablet 3  . amLODipine (NORVASC) 5 MG tablet TAKE 1 TABLET BY  MOUTH EVERY DAY (Patient taking differently: Take 5 mg by mouth daily. ) 90 tablet 1  . aspirin EC 81 MG tablet Take 1 tablet (81 mg total) by mouth daily. (Patient not taking: Reported on 07/06/2020) 90 tablet 3  . Azelastine-Fluticasone 137-50 MCG/ACT SUSP Place 1 spray into the nose daily as needed (allergies).     . bisoprolol (ZEBETA) 10 MG tablet Take 1 tablet (10 mg total) by mouth daily. 30 tablet 2  . budesonide-formoterol (SYMBICORT) 160-4.5 MCG/ACT inhaler Inhale 2 puffs into the lungs in the morning and at bedtime. (Patient taking differently: Inhale 2 puffs into the lungs daily as needed (shortness of breath, wheezing). ) 1 each 12  . Canagliflozin-metFORMIN HCl (INVOKAMET) [phone] MG TABS Take 1 tablet by mouth 2 (two) times daily. (Patient not taking: Reported on 07/06/2020) 180 tablet 3  . famciclovir (FAMVIR) 500 MG tablet TAKE 3 TABLETS BY MOUTH EVERY DAY AS NEEDED AT FIRST SIGN OF OUTBREAK OF FEVER BLISTER (Patient not taking: Reported on 07/06/2020) 15 tablet 1  . folic acid (FOLVITE) 782 MCG tablet Take 1,600 mcg by mouth daily.      . furosemide (LASIX) 40 MG tablet Take 40mg  twice daily for 3 days followed by once daily 60 tablet 1  . glimepiride (AMARYL) 2 MG tablet TAKE 1 TABLET (2 MG TOTAL) BY MOUTH DAILY BEFORE BREAKFAST. (Patient taking differently: Take 2 mg by mouth daily with breakfast. ) 90 tablet 3  . glucose blood (BAYER CONTOUR NEXT TEST) test strip Use to test blood sugars daily. Dx: E11.9 100 each 12  . Multiple Vitamin (MULTIVITAMIN) tablet Take 1 tablet by  mouth daily.      . nitroGLYCERIN (NITROSTAT) 0.4 MG SL tablet PLACE 1 TABLET UNDER THE TONGUE EVERY 5 MINUTES AS NEEDED FOR CHEST PAIN. (Patient taking differently: Place 0.4 mg under the tongue every 5 (five) minutes x 3 doses as needed for chest pain. ) 25 tablet 1  . omega-3 acid ethyl esters (LOVAZA) 1 g capsule Take 1 capsule (1 g total) by mouth 3 (three) times daily. (Patient taking differently: Take 1 g  by mouth 2 (two) times daily. ) 270 capsule 3  . oxymetazoline (AFRIN) 0.05 % nasal spray Place 1 spray into both nostrils 2 (two) times daily as needed for congestion.    . potassium chloride SA (KLOR-CON) 20 MEQ tablet Take 1 tablet (20 mEq total) by mouth daily. 30 tablet 1  . rosuvastatin (CRESTOR) 20 MG tablet TAKE 1 TABLET BY MOUTH DAILY (Patient taking differently: Take 20 mg by mouth daily. ) 90 tablet 4  . sacubitril-valsartan (ENTRESTO) 24-26 MG Take 1 tablet by mouth 2 (two) times daily. 60 tablet 2  . Semaglutide,0.25 or 0.5MG /DOS, (OZEMPIC, 0.25 OR 0.5 MG/DOSE,) 2 MG/1.5ML SOPN Inject 0.25 mg as directed once a week for 28 days, THEN 0.5 mg once a week for 14 days. (Patient taking differently: Inject 0.25 mg subcutaneously once weekly.) 1.5 mL 0  . zaleplon (SONATA) 10 MG capsule TAKE 1 CAPSULE (10 MG TOTAL) BY MOUTH AT BEDTIME AS NEEDED. FOR SLEEP (Patient taking differently: Take 10 mg by mouth at bedtime as needed for sleep. ) 30 capsule 5   No current facility-administered medications for this visit.   Objective  Objective:  There were no vitals taken for this visit. Gen: NAD, resting comfortably CV: RRR no murmurs rubs or gallops Lungs: CTAB no crackles, wheeze, rhonchi Abdomen: soft/nontender/nondistended/normal bowel sounds. No rebound or guarding.  Ext: no edema Skin: warm, dry Neuro: grossly normal, moves all extremities  ***   Assessment and Plan:   ***   No problem-specific Assessment & Plan notes found for this encounter.   Recommended follow up: ***No follow-ups on file. Future Appointments  Date Time Provider [place]  07/17/2020  3:20 PM Marin Olp, MD [facility]  07/22/2020 11:15 AM Erlene Quan, PA-C [facility]  08/19/2020  3:45 PM Melrose Nakayama, MD [facility]    Lab/Order associations: No diagnosis found.  No orders of the defined types were placed in this encounter.   Return precautions advised.   Clyde Lundborg, CMA

## 2020-07-16 NOTE — Patient Instructions (Incomplete)
Depression screen [facility] 2/9 02/21/2020 10/23/2019 07/09/2019  Decreased Interest 0 0 3  Down, Depressed, Hopeless 0 1 2  PHQ - 2 Score 0 1 5  Altered sleeping 3 0 0  Tired, decreased energy 0 0 0  Change in appetite 0 0 0  Feeling bad or failure about yourself  1 0 0  Trouble concentrating 0 0 1  Moving slowly or fidgety/restless 0 0 0  Suicidal thoughts 0 0 0  PHQ-9 Score 4 1 6   Difficult doing work/chores Not difficult at all Not difficult at all Somewhat difficult  Some recent data might be hidden

## 2020-07-17 ENCOUNTER — Inpatient Hospital Stay: Payer: Medicare Other | Admitting: Family Medicine

## 2020-07-17 ENCOUNTER — Encounter: Payer: Self-pay | Admitting: Family Medicine

## 2020-07-17 ENCOUNTER — Other Ambulatory Visit: Payer: Self-pay

## 2020-07-17 ENCOUNTER — Ambulatory Visit (INDEPENDENT_AMBULATORY_CARE_PROVIDER_SITE_OTHER): Payer: Medicare Other | Admitting: Family Medicine

## 2020-07-17 VITALS — BP 129/71 | HR 102 | Temp 98.5°F | Ht 71.0 in | Wt 172.8 lb

## 2020-07-17 DIAGNOSIS — E1169 Type 2 diabetes mellitus with other specified complication: Secondary | ICD-10-CM

## 2020-07-17 DIAGNOSIS — I509 Heart failure, unspecified: Secondary | ICD-10-CM | POA: Diagnosis not present

## 2020-07-17 DIAGNOSIS — C649 Malignant neoplasm of unspecified kidney, except renal pelvis: Secondary | ICD-10-CM | POA: Insufficient documentation

## 2020-07-17 DIAGNOSIS — I2510 Atherosclerotic heart disease of native coronary artery without angina pectoris: Secondary | ICD-10-CM | POA: Diagnosis not present

## 2020-07-17 DIAGNOSIS — C642 Malignant neoplasm of left kidney, except renal pelvis: Secondary | ICD-10-CM | POA: Diagnosis not present

## 2020-07-17 DIAGNOSIS — I5042 Chronic combined systolic (congestive) and diastolic (congestive) heart failure: Secondary | ICD-10-CM | POA: Insufficient documentation

## 2020-07-17 DIAGNOSIS — I10 Essential (primary) hypertension: Secondary | ICD-10-CM

## 2020-07-17 NOTE — Assessment & Plan Note (Addendum)
Diagnosed during recent hospitalization-may have been triggered by fluids given during time of renal biopsy.  Appropriately diuresed.  Weight down over 10 pounds.  We will continue Lasix.  Update blood work  Patient also is compliant with Entresto and SGLT2 inhibitor.  He is pending restart of bisoprolol  We did discuss if blood pressure goes down when he restarts bisoprolol potentially stopping amlodipine or reducing to 2.5 mg as not ideal with heart failure and may contribute to trace edema at present

## 2020-07-17 NOTE — Assessment & Plan Note (Signed)
Well-controlled on amlodipine 5 mg, Entresto, Lasix 40 mg daily.  Patient is trying to get his bisoprolol.  We discussed the blood pressure trends down any further when he starts this potentially reduce amlodipine to 2.5 mg

## 2020-07-17 NOTE — Assessment & Plan Note (Signed)
Well-controlled on invokana 300mg  and metformin 2g (combo pill BID), Ozempic, Amaryl 2 mg Lab Results  Component Value Date   HGBA1C 6.4 (H) 07/05/2020   HGBA1C 8.8 (H) 02/21/2020   HGBA1C 8.3 (A) 10/23/2019  With significant decrease in A1c-we opted to discontinue Amaryl especially with prior weight loss.  Want to avoid hypoglycemia with upcoming procedures.  Would like to continue invokamet and Ozempic for now

## 2020-07-17 NOTE — Patient Instructions (Addendum)
Stop glimepiride with how a1c came down to 6.4.   Worth calling Dr. Roxan Hockey to push out the date in January for the visit.   Please stop by lab before you go If you have mychart- we will send your results within 3 business days of [place] receiving them.  If you do not have mychart- we will call you about results within 5 business days of [place] receiving them.  *please note we are currently using [facility] which has a longer processing time than  typically so labs may not come back as quickly as in the past *please also note that you will see labs on mychart as soon as they post. I will later go in and write notes on them- will say "notes from Dr. Yong Channel"  Recommended follow up: Return in about 3 months (around 10/15/2020).

## 2020-07-17 NOTE — Assessment & Plan Note (Signed)
Planned left total nephrectomy with [facility] August 06, 2020.  Patient with excellent social support.  Follow-up plans will be based off of surgical pathology.

## 2020-07-17 NOTE — Progress Notes (Signed)
Phone [phone] In person visit   Subjective:   Larry Garner is a 64 y.o. year old very pleasant male patient who presents for/with See problem oriented charting Chief Complaint  Patient presents with  . Hospitalization Follow-up    Follow up on his CHF. He states that he has cancer and is waiting to have a tumor removed. States that he feels tired other than that he's fine    This visit occurred during the SARS-CoV-2 public health emergency.  Safety protocols were in place, including screening questions prior to the visit, additional usage of staff PPE, and extensive cleaning of exam room while observing appropriate contact time as indicated for disinfecting solutions.   Past Medical History-  Patient Active Problem List   Diagnosis Date Noted  . Chronic congestive heart failure ([place]) 07/17/2020    Priority: High  . Type 2 diabetes mellitus with other specified complication ([place]) 75/64/3329    Priority: High  . Coronary artery disease involving native coronary artery of native heart without angina pectoris 04/06/2007    Priority: High  . OSA (obstructive sleep apnea) 01/09/2018    Priority: Medium  . Pulmonary nodule, left 03/15/2017    Priority: Medium  . Insomnia 05/27/2014    Priority: Medium  . Asthma, moderate persistent 06/19/2008    Priority: Medium  . ERECTILE DYSFUNCTION, SECONDARY TO MEDICATION 05/17/2007    Priority: Medium  . Hyperlipidemia 04/06/2007    Priority: Medium  . Anxiety state 04/06/2007    Priority: Medium  . Essential hypertension 04/06/2007    Priority: Medium  . Former smoker 11/04/2014    Priority: Low  . Plantar wart of left foot 11/27/2010    Priority: Low  . PSA, INCREASED 09/15/2009    Priority: Low  . Diverticulitis of colon 05/08/2009    Priority: Low  . ANKLE PAIN, CHRONIC 06/05/2007    Priority: Low  . Allergic rhinitis 05/17/2007    Priority: Low  . Renal cell carcinoma ([place]) 07/17/2020  . Protein-calorie malnutrition,  severe 07/08/2020  . Acute respiratory failure with hypoxia ([place]) 07/05/2020  . Iron deficiency anemia due to chronic blood loss 06/19/2020    Medications- reviewed and updated Current Outpatient Medications  Medication Sig Dispense Refill  . albuterol (VENTOLIN HFA) 108 (90 Base) MCG/ACT inhaler Inhale 2 puffs into the lungs every 6 (six) hours as needed for wheezing or shortness of breath. 18 g 11  . ALPRAZolam (XANAX) 0.5 MG tablet Take 1 tablet (0.5 mg total) by mouth every 6 (six) hours as needed. (Patient taking differently: Take 0.5 mg by mouth every 6 (six) hours as needed for anxiety.) 30 tablet 3  . amLODipine (NORVASC) 5 MG tablet TAKE 1 TABLET BY MOUTH EVERY DAY (Patient taking differently: Take 5 mg by mouth daily.) 90 tablet 1  . aspirin EC 81 MG tablet Take 1 tablet (81 mg total) by mouth daily. 90 tablet 3  . Azelastine-Fluticasone 137-50 MCG/ACT SUSP Place 1 spray into the nose daily as needed (allergies).     . bisoprolol (ZEBETA) 10 MG tablet Take 1 tablet (10 mg total) by mouth daily. 30 tablet 2  . budesonide-formoterol (SYMBICORT) 160-4.5 MCG/ACT inhaler Inhale 2 puffs into the lungs in the morning and at bedtime. (Patient taking differently: Inhale 2 puffs into the lungs daily as needed (shortness of breath, wheezing).) 1 each 12  . Canagliflozin-metFORMIN HCl (INVOKAMET) [phone] MG TABS Take 1 tablet by mouth 2 (two) times daily. 180 tablet 3  . famciclovir (FAMVIR) 500  MG tablet TAKE 3 TABLETS BY MOUTH EVERY DAY AS NEEDED AT FIRST SIGN OF OUTBREAK OF FEVER BLISTER 15 tablet 1  . folic acid (FOLVITE) 161 MCG tablet Take 1,600 mcg by mouth daily.    . furosemide (LASIX) 40 MG tablet Take 25m twice daily for 3 days followed by once daily 60 tablet 1  . glucose blood (BAYER CONTOUR NEXT TEST) test strip Use to test blood sugars daily. Dx: E11.9 100 each 12  . Multiple Vitamin (MULTIVITAMIN) tablet Take 1 tablet by mouth daily.    . nitroGLYCERIN (NITROSTAT) 0.4 MG SL  tablet PLACE 1 TABLET UNDER THE TONGUE EVERY 5 MINUTES AS NEEDED FOR CHEST PAIN. (Patient taking differently: Place 0.4 mg under the tongue every 5 (five) minutes x 3 doses as needed for chest pain.) 25 tablet 1  . omega-3 acid ethyl esters (LOVAZA) 1 g capsule Take 1 capsule (1 g total) by mouth 3 (three) times daily. (Patient taking differently: Take 1 g by mouth 2 (two) times daily.) 270 capsule 3  . oxymetazoline (AFRIN) 0.05 % nasal spray Place 1 spray into both nostrils 2 (two) times daily as needed for congestion.    . potassium chloride SA (KLOR-CON) 20 MEQ tablet Take 1 tablet (20 mEq total) by mouth daily. 30 tablet 1  . rosuvastatin (CRESTOR) 20 MG tablet TAKE 1 TABLET BY MOUTH DAILY (Patient taking differently: Take 20 mg by mouth daily.) 90 tablet 4  . sacubitril-valsartan (ENTRESTO) 24-26 MG Take 1 tablet by mouth 2 (two) times daily. 60 tablet 2  . Semaglutide,0.25 or 0.5MG/DOS, (OZEMPIC, 0.25 OR 0.5 MG/DOSE,) 2 MG/1.5ML SOPN Inject 0.25 mg as directed once a week for 28 days, THEN 0.5 mg once a week for 14 days. (Patient taking differently: Inject 0.25 mg subcutaneously once weekly.) 1.5 mL 0  . zaleplon (SONATA) 10 MG capsule TAKE 1 CAPSULE (10 MG TOTAL) BY MOUTH AT BEDTIME AS NEEDED. FOR SLEEP (Patient taking differently: Take 10 mg by mouth at bedtime as needed for sleep.) 30 capsule 5   No current facility-administered medications for this visit.     Objective:  BP 129/71   Pulse (!) 102   Temp 98.5 F (36.9 C) (Temporal)   Ht _0  (1.803 m)   Wt 172 lb 12.8 oz (78.4 kg)   SpO2 98%   BMI 24.10 kg/m  Gen: NAD, resting comfortably CV: RRR (heart rate trended down during visit further and patient has not been able to get bisoprolol yet) no murmurs rubs or gallops Lungs: CTAB no crackles, wheeze, rhonchi Abdomen: soft/nontender/nondistended/normal bowel sounds. No rebound or guarding.  Ext: trace edema Skin: warm, dry Neuro: grossly normal, moves all extremities      Assessment and Plan   #Social update-with recent medical issues patient has decided to retire  #Hospital follow-up S: Patient presented to the hospital on July 05, 2020 after recent renal mass biopsy with progressive shortness of breath and edema.  BNP was over thousand, chest x-ray with interstitial edema and cardiomegaly noted concerning for congestive heart failure.  Patient was started on IV Lasix and was diuresed eventually able to be discharged on July 11, 2020  On echocardiogram patient with ejection fraction of 45 to 50% as well as grade 2 diastolic dysfunction.  Patient was treated with IV furosemide and diuresed well.  Lower extremity edema persisted.  Transition to oral furosemide/Lasix.  Patient also started on Entresto.  Bisoprolol was also continued.  [facility]  He was already on SGLT 2 inhibitor and this was continued.  They discussed briefly starting spironolactone but this was deferred to outpatient setting  For hypertension-hydrochlorothiazide was discontinued since Lasix was started.  Patient was continued on amlodipine in addition to medication as above including bisoprolol 10 mg, Lasix 40 mg daily, Entresto 24-26 mg twice daily (off losartan as result)  For diabetes-A1c was at 6.4.  Continued on home medications-glimepiride 2 mg, invoke a met [phone] mg twice daily, Ozempic 0.5 mg weekly  For hyperlipidemia patient continued on rosuvastatin 20 mg  Of note patient does have renal mass which has been confirmed to be cancer-upcoming surgery planned dec 29th with radical nephrectomy with Dr. Inocente Salles.  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  Per notes due to results possibility of splenectomy and partial colectomy discussed.  Patient also has a lung nodule with plan to address that at a later date with [facility].   He feels fatigued but other than that feels reasonably well overall. Has not been able to  get bisoprolol yet through pharmacy but they are actively working on it- HR slightly high initially today.  A/P: 64 year old male here for hospital follow-up for CHF   Chronic congestive heart failure ([place]) Diagnosed during recent hospitalization-may have been triggered by fluids given during time of renal biopsy.  Appropriately diuresed.  Weight down over 10 pounds.  We will continue Lasix.  Update blood work  Patient also is compliant with Entresto and SGLT2 inhibitor.  He is pending restart of bisoprolol  We did discuss if blood pressure goes down when he restarts bisoprolol potentially stopping amlodipine or reducing to 2.5 mg as not ideal with heart failure and may contribute to trace edema at present  Renal cell carcinoma [facility]) Planned left total nephrectomy with [facility] August 06, 2020.  Patient with excellent social support.  Follow-up plans will be based off of surgical pathology.  Type 2 diabetes mellitus with other specified complication (HCC)  Well-controlled on invokana 343m and metformin 2g (combo pill BID), Ozempic, Amaryl 2 mg Lab Results  Component Value Date   HGBA1C 6.4 (H) 07/05/2020   HGBA1C 8.8 (H) 02/21/2020   HGBA1C 8.3 (A) 10/23/2019  With significant decrease in A1c-we opted to discontinue Amaryl especially with prior weight loss.  Want to avoid hypoglycemia with upcoming procedures.  Would like to continue invokamet and Ozempic for now    Coronary artery disease involving native coronary artery of native heart without angina pectoris Patient is compliant with statin-continue for now.  He states not taking aspirin until after his surgery as previously discussed.  This sounds reasonable but discussed if any chest pain would recommend taking aspirin  Essential hypertension Well-controlled on amlodipine 5 mg, Entresto, Lasix 40 mg daily.  Patient is trying to get his bisoprolol.  We discussed the blood pressure trends down any further when he starts this  potentially reduce amlodipine to 2.5 mg   Recommended follow up: Return in about 3 months (around 10/15/2020). Future Appointments  Date Time Provider [place] 07/22/2020 11:15 AM KErlene Quan PA-C [facility] 08/19/2020  3:45 PM HMelrose Nakayama MD [facility]    Lab/Order associations:   ICD-10-CM   1. Chronic congestive heart failure, unspecified heart failure type (HCC)  I50.9 CBC With Differential/Platelet    COMPLETE METABOLIC PANEL WITH GFR    COMPLETE METABOLIC PANEL WITH GFR    CBC With Differential/Platelet  2. Renal cell carcinoma  of left kidney ([place])  C64.2   3. Type 2 diabetes mellitus with other specified complication, without long-term current use of insulin (HCC)  E11.69   4. Coronary artery disease involving native coronary artery of native heart without angina pectoris  I25.10   5. Essential hypertension  I10     Time Spent: 45 minutes of total time (1:38 PM- 2:23 PM) was spent on the date of the encounter performing the following actions: chart review prior to seeing the patient, obtaining history, performing a medically necessary exam, counseling on the treatment plan, placing orders, and documenting in our EHR.   Return precautions advised.  [name], MD

## 2020-07-17 NOTE — Assessment & Plan Note (Signed)
Patient is compliant with statin-continue for now.  He states not taking aspirin until after his surgery as previously discussed.  This sounds reasonable but discussed if any chest pain would recommend taking aspirin

## 2020-07-18 DIAGNOSIS — G4700 Insomnia, unspecified: Secondary | ICD-10-CM | POA: Diagnosis not present

## 2020-07-18 DIAGNOSIS — Z955 Presence of coronary angioplasty implant and graft: Secondary | ICD-10-CM | POA: Diagnosis not present

## 2020-07-18 DIAGNOSIS — Z01818 Encounter for other preprocedural examination: Secondary | ICD-10-CM | POA: Diagnosis not present

## 2020-07-18 DIAGNOSIS — G4733 Obstructive sleep apnea (adult) (pediatric): Secondary | ICD-10-CM | POA: Diagnosis not present

## 2020-07-18 DIAGNOSIS — Z79899 Other long term (current) drug therapy: Secondary | ICD-10-CM | POA: Diagnosis not present

## 2020-07-18 DIAGNOSIS — D509 Iron deficiency anemia, unspecified: Secondary | ICD-10-CM | POA: Diagnosis not present

## 2020-07-18 DIAGNOSIS — Z87891 Personal history of nicotine dependence: Secondary | ICD-10-CM | POA: Diagnosis not present

## 2020-07-18 DIAGNOSIS — I5022 Chronic systolic (congestive) heart failure: Secondary | ICD-10-CM | POA: Diagnosis not present

## 2020-07-18 DIAGNOSIS — F419 Anxiety disorder, unspecified: Secondary | ICD-10-CM | POA: Diagnosis not present

## 2020-07-18 DIAGNOSIS — I2510 Atherosclerotic heart disease of native coronary artery without angina pectoris: Secondary | ICD-10-CM | POA: Diagnosis not present

## 2020-07-18 DIAGNOSIS — Z01812 Encounter for preprocedural laboratory examination: Secondary | ICD-10-CM | POA: Diagnosis not present

## 2020-07-18 DIAGNOSIS — I252 Old myocardial infarction: Secondary | ICD-10-CM | POA: Diagnosis not present

## 2020-07-18 DIAGNOSIS — E119 Type 2 diabetes mellitus without complications: Secondary | ICD-10-CM | POA: Diagnosis not present

## 2020-07-18 DIAGNOSIS — Z951 Presence of aortocoronary bypass graft: Secondary | ICD-10-CM | POA: Diagnosis not present

## 2020-07-18 DIAGNOSIS — I110 Hypertensive heart disease with heart failure: Secondary | ICD-10-CM | POA: Diagnosis not present

## 2020-07-18 DIAGNOSIS — Z7984 Long term (current) use of oral hypoglycemic drugs: Secondary | ICD-10-CM | POA: Diagnosis not present

## 2020-07-18 DIAGNOSIS — C642 Malignant neoplasm of left kidney, except renal pelvis: Secondary | ICD-10-CM | POA: Diagnosis not present

## 2020-07-18 DIAGNOSIS — J4520 Mild intermittent asthma, uncomplicated: Secondary | ICD-10-CM | POA: Diagnosis not present

## 2020-07-18 LAB — CBC WITH DIFFERENTIAL/PLATELET
Absolute Monocytes: 791 cells/uL (ref 200–950)
Basophils Absolute: 28 cells/uL (ref 0–200)
Basophils Relative: 0.3 %
Eosinophils Absolute: 258 cells/uL (ref 15–500)
Eosinophils Relative: 2.8 %
HCT: 28.4 % — ABNORMAL LOW (ref 38.5–50.0)
Hemoglobin: 8.6 g/dL — ABNORMAL LOW (ref 13.2–17.1)
Lymphs Abs: 1141 cells/uL (ref 850–3900)
MCH: 23.9 pg — ABNORMAL LOW (ref 27.0–33.0)
MCHC: 30.3 g/dL — ABNORMAL LOW (ref 32.0–36.0)
MCV: 78.9 fL — ABNORMAL LOW (ref 80.0–100.0)
MPV: 10.1 fL (ref 7.5–12.5)
Monocytes Relative: 8.6 %
Neutro Abs: 6983 cells/uL (ref 1500–7800)
Neutrophils Relative %: 75.9 %
Platelets: 396 10*3/uL (ref 140–400)
RBC: 3.6 10*6/uL — ABNORMAL LOW (ref 4.20–5.80)
RDW: 18.6 % — ABNORMAL HIGH (ref 11.0–15.0)
Total Lymphocyte: 12.4 %
WBC: 9.2 10*3/uL (ref 3.8–10.8)

## 2020-07-18 LAB — COMPLETE METABOLIC PANEL WITH GFR
AG Ratio: 0.8 (calc) — ABNORMAL LOW (ref 1.0–2.5)
ALT: 16 U/L (ref 9–46)
AST: 17 U/L (ref 10–35)
Albumin: 2.9 g/dL — ABNORMAL LOW (ref 3.6–5.1)
Alkaline phosphatase (APISO): 107 U/L (ref 35–144)
BUN: 13 mg/dL (ref 7–25)
CO2: 25 mmol/L (ref 20–32)
Calcium: 8.9 mg/dL (ref 8.6–10.3)
Chloride: 100 mmol/L (ref 98–110)
Creat: 0.82 mg/dL (ref 0.70–1.25)
GFR, Est African American: 108 mL/min/{1.73_m2} (ref >=60)
GFR, Est Non African American: 93 mL/min/{1.73_m2} (ref >=60)
Globulin: 3.8 g/dL (calc) — ABNORMAL HIGH (ref 1.9–3.7)
Glucose, Bld: 237 mg/dL — ABNORMAL HIGH (ref 65–99)
Potassium: 4.5 mmol/L (ref 3.5–5.3)
Sodium: 134 mmol/L — ABNORMAL LOW (ref 135–146)
Total Bilirubin: 0.5 mg/dL (ref 0.2–1.2)
Total Protein: 6.7 g/dL (ref 6.1–8.1)

## 2020-07-22 ENCOUNTER — Ambulatory Visit (INDEPENDENT_AMBULATORY_CARE_PROVIDER_SITE_OTHER): Payer: Medicare Other | Admitting: Cardiology

## 2020-07-22 ENCOUNTER — Other Ambulatory Visit: Payer: Self-pay

## 2020-07-22 ENCOUNTER — Encounter: Payer: Self-pay | Admitting: Cardiology

## 2020-07-22 VITALS — BP 114/58 | HR 89 | Ht 71.0 in | Wt 171.0 lb

## 2020-07-22 DIAGNOSIS — I5043 Acute on chronic combined systolic (congestive) and diastolic (congestive) heart failure: Secondary | ICD-10-CM

## 2020-07-22 DIAGNOSIS — I10 Essential (primary) hypertension: Secondary | ICD-10-CM | POA: Diagnosis not present

## 2020-07-22 DIAGNOSIS — I2510 Atherosclerotic heart disease of native coronary artery without angina pectoris: Secondary | ICD-10-CM | POA: Diagnosis not present

## 2020-07-22 DIAGNOSIS — E1169 Type 2 diabetes mellitus with other specified complication: Secondary | ICD-10-CM

## 2020-07-22 DIAGNOSIS — C642 Malignant neoplasm of left kidney, except renal pelvis: Secondary | ICD-10-CM

## 2020-07-22 NOTE — Patient Instructions (Signed)
Medication Instructions:  Continue current medications  *If you need a refill on your cardiac medications before your next appointment, please call your pharmacy*   Lab Work: None Ordered   Testing/Procedures: None Ordered   Follow-Up: At [facility], you and your health needs are our priority.  As part of our continuing mission to provide you with exceptional heart care, we have created designated Provider Care Teams.  These Care Teams include your primary Cardiologist (physician) and Advanced Practice Providers (APPs -  Physician Assistants and Nurse Practitioners) who all work together to provide you with the care you need, when you need it.  We recommend signing up for the patient portal called "MyChart".  Sign up information is provided on this After Visit Summary.  MyChart is used to connect with patients for Virtual Visits (Telemedicine).  Patients are able to view lab/test results, encounter notes, upcoming appointments, etc.  Non-urgent messages can be sent to your provider as well.   To learn more about what you can do with MyChart, go to [email or web address].    Your next appointment:   6 week(s)  The format for your next appointment:   In Person  Provider:   Dr Johnsie Cancel

## 2020-07-22 NOTE — Progress Notes (Signed)
Cardiology Office Note:    Date:  07/22/2020   ID:  Larry Garner, DOB Aug 05, 1956, MRN [id number]  PCP:  Marin Olp, MD  Cardiologist:  Jenkins Rouge, MD  Electrophysiologist:  None   Referring MD: Marin Olp, MD   Chief Complaint  Patient presents with  . Follow-up  . Edema    Legs.    History of Present Illness:    Larry Garner is a 64 y.o. male with a hx of CAD s/p PCI to LAD with restenosis, subsequently requiring CABG 06/19/1999.  Last catheter 2017 with total occlusion and LAD stent, total occlusion of OM#1/ramus intermedius, 67% first diagonal, total occlusion of the native RCA with LVEF of 45 to 50%.  Commendations at the time are for aggressive risk factor modification given the absence of cardiovascular symptoms.  No CTO recanalization was pursued. The patient is being evaluated for anemia and a renal mass at [facility].  He had a renal mass that was biopsied on 07/01/2020.  After the biopsy he was admitted overnight where he received IVF. Prior to the biopsy he underwent 1 iron transfusion and 1 blood transfusions. Patient reported shortness of breath for the 3 days after and pedal edema.  He presented to the ED at [facility] 07/05/2020 with CHF. He was admitted and treated with IV diuresis.  His weight came down from 173.5 lbs to 171 lbs.  He improved symptomatically.  Echo showed an EF of 45-50% with grade 2 DD.  He was discharged on Lasix 40 mg BID x 3 days, then Lasix 40 mg daily.  He is in the office today for follow up.  He had labs drawn at [facility] 07/18/2020.  Renal function is stable- K+ was 4.9 K_ 20 Meq and Entresto.  The patient admitted to me that he was taking an extra Lasix once or twice because he felt like he was retaining fluid- LE edema and "swelling in my hands".    Past Medical History:  Diagnosis Date  . Allergic rhinitis   . Allergy   . Anxiety   . Asthma   . CHF (congestive heart failure) ([place])   . Diabetes ([place])   . Diverticulitis   .  Dyspnea   . GERD (gastroesophageal reflux disease)    very occ  . History of heart attack   . HLD (hyperlipidemia)   . HTN (hypertension)   . Myocardial infarction ([place]) 2000  . Sleep apnea    wears cpap   . Tubular adenoma of colon 09/2008    Past Surgical History:  Procedure Laterality Date  . CARDIAC CATHETERIZATION N/A 07/20/2016   Procedure: Left Heart Cath and Cors/Grafts Angiography;  Surgeon: Belva Crome, MD;  Location: [facility];  Service: Cardiovascular;  Laterality: N/A;  . CATARACT EXTRACTION Bilateral 06/06/2019   Dr. Tommy Rainwater. oct left, dec right 2020   . COLONOSCOPY    . CORONARY ANGIOPLASTY    . CORONARY ARTERY BYPASS GRAFT  06/1999  . POLYPECTOMY    . RENAL BIOPSY  06/2020  . TONSILLECTOMY     late50's early 60's    Current Medications: Current Meds  Medication Sig  . albuterol (VENTOLIN HFA) 108 (90 Base) MCG/ACT inhaler Inhale 2 puffs into the lungs every 6 (six) hours as needed for wheezing or shortness of breath.  . ALPRAZolam (XANAX) 0.5 MG tablet Take 1 tablet (0.5 mg total) by mouth every 6 (six) hours as needed. (Patient taking differently: Take 0.5 mg  by mouth every 6 (six) hours as needed for anxiety.)  . amLODipine (NORVASC) 5 MG tablet TAKE 1 TABLET BY MOUTH EVERY DAY (Patient taking differently: Take 5 mg by mouth daily.)  . aspirin EC 81 MG tablet Take 1 tablet (81 mg total) by mouth daily.  . Azelastine-Fluticasone 137-50 MCG/ACT SUSP Place 1 spray into the nose daily as needed (allergies).   . bisoprolol (ZEBETA) 10 MG tablet Take 1 tablet (10 mg total) by mouth daily.  . Canagliflozin-metFORMIN HCl (INVOKAMET) [phone] MG TABS Take 1 tablet by mouth 2 (two) times daily.  . famciclovir (FAMVIR) 500 MG tablet TAKE 3 TABLETS BY MOUTH EVERY DAY AS NEEDED AT FIRST SIGN OF OUTBREAK OF FEVER BLISTER  . folic acid (FOLVITE) 409 MCG tablet Take 1,600 mcg by mouth daily.  . furosemide (LASIX) 40 MG tablet Take 40mg  twice daily for 3 days followed  by once daily  . glucose blood (BAYER CONTOUR NEXT TEST) test strip Use to test blood sugars daily. Dx: E11.9  . Multiple Vitamin (MULTIVITAMIN) tablet Take 1 tablet by mouth daily.  . nitroGLYCERIN (NITROSTAT) 0.4 MG SL tablet PLACE 1 TABLET UNDER THE TONGUE EVERY 5 MINUTES AS NEEDED FOR CHEST PAIN. (Patient taking differently: Place 0.4 mg under the tongue every 5 (five) minutes x 3 doses as needed for chest pain.)  . oxymetazoline (AFRIN) 0.05 % nasal spray Place 1 spray into both nostrils 2 (two) times daily as needed for congestion.  . potassium chloride SA (KLOR-CON) 20 MEQ tablet Take 1 tablet (20 mEq total) by mouth daily.  . rosuvastatin (CRESTOR) 20 MG tablet TAKE 1 TABLET BY MOUTH DAILY (Patient taking differently: Take 20 mg by mouth daily.)  . sacubitril-valsartan (ENTRESTO) 24-26 MG Take 1 tablet by mouth 2 (two) times daily.  . Semaglutide,0.25 or 0.5MG /DOS, (OZEMPIC, 0.25 OR 0.5 MG/DOSE,) 2 MG/1.5ML SOPN Inject 0.25 mg as directed once a week for 28 days, THEN 0.5 mg once a week for 14 days. (Patient taking differently: Inject 0.25 mg subcutaneously once weekly.)  . zaleplon (SONATA) 10 MG capsule TAKE 1 CAPSULE (10 MG TOTAL) BY MOUTH AT BEDTIME AS NEEDED. FOR SLEEP (Patient taking differently: Take 10 mg by mouth at bedtime as needed for sleep.)  . [DISCONTINUED] budesonide-formoterol (SYMBICORT) 160-4.5 MCG/ACT inhaler Inhale 2 puffs into the lungs in the morning and at bedtime. (Patient taking differently: Inhale 2 puffs into the lungs daily as needed (shortness of breath, wheezing).)  . [DISCONTINUED] omega-3 acid ethyl esters (LOVAZA) 1 g capsule Take 1 capsule (1 g total) by mouth 3 (three) times daily. (Patient taking differently: Take 1 g by mouth 2 (two) times daily.)     Allergies:   Morphine sulfate   Social History   Socioeconomic History  . Marital status: Married    Spouse name: Not on file  . Number of children: Not on file  . Years of education: Not on file  .  Highest education level: Not on file  Occupational History  . Occupation: Banker  Tobacco Use  . Smoking status: Former Smoker    Packs/day: 0.50    Years: 4.00    Pack years: 2.00    Types: Cigarettes    Quit date: 09/29/1977    Years since quitting: 42.8  . Smokeless tobacco: Never Used  . Tobacco comment: quit on 1980  Vaping Use  . Vaping Use: Never used  Substance and Sexual Activity  . Alcohol use: Not Currently  . Drug use: No  . Sexual  activity: Yes  Other Topics Concern  . Not on file  Social History Narrative   Married  In 1981 with 2 kids (son and daughter). No grandkids.       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 There is no history of Pancreatic cancer, Stomach cancer, Thyroid disease, Esophageal cancer, or Rectal cancer.  ROS:   Please see the history of present illness.     All other systems reviewed and are negative.  EKGs/Labs/Other Studies Reviewed:    The following studies were reviewed today: Echo 07/06/2020- IMPRESSIONS    1. Left ventricular ejection fraction, by estimation, is 45 to 50%. The  left ventricle has mildly decreased function. The left ventricle  demonstrates regional wall motion abnormalities (see scoring  diagram/findings for description). There is mild left  ventricular hypertrophy. Left ventricular diastolic parameters are  consistent with Grade II diastolic dysfunction (pseudonormalization).  Elevated left  ventricular end-diastolic pressure. There is severe akinesis  of the left ventricular, mid-apical  inferior wall and apical segment.  2. Right ventricular systolic function is low normal. The right  ventricular size is normal. There is moderately elevated pulmonary artery  systolic pressure.  3. Left atrial size was moderately dilated.  4. The mitral valve is abnormal. Mild mitral valve regurgitation.  5. The aortic valve is tricuspid. Aortic valve regurgitation is trivial.  6. The inferior vena cava is dilated in size with >50% respiratory  variability, suggesting right atrial pressure of 8 mmHg.   EKG:  EKG is not ordered today.  The ekg ordered 07/05/2020 demonstrates NSR, RBBB, inferior Qs  Recent Labs: 07/05/2020: B Natriuretic Peptide 1,435.5; Magnesium 2.1; TSH 0.369 07/17/2020: ALT 16; BUN 13; Creat 0.82; Hemoglobin 8.6; Platelets 396; Potassium 4.5; Sodium 134  Recent Lipid Panel    Component Value Date/Time   CHOL 97 02/21/2020 1652   TRIG 175.0 (H) 02/21/2020 1652   HDL 27.60 (L) 02/21/2020 1652   CHOLHDL 4 02/21/2020 1652   VLDL 35.0 02/21/2020 1652   LDLCALC 34 02/21/2020 1652   LDLDIRECT 54.0 01/16/2019 0930    Physical Exam:    VS:  BP (!) 114/58 (BP Location: Left Arm, Patient Position: Sitting, Cuff Size: Normal)   Pulse 89   Ht 5\' 11"  (1.803 m)   Wt 171 lb (77.6 kg)   BMI 23.85 kg/m     Wt Readings from Last 3 Encounters:  07/22/20 171 lb (77.6 kg)  07/17/20 172 lb 12.8 oz (78.4 kg)  07/11/20 173 lb 8 oz (78.7 kg)     GEN: Well nourished, well developed in no acute distress HEENT: Normal NECK: No JVD; No carotid bruits CARDIAC: RRR, no murmurs, rubs, gallops RESPIRATORY:  Clear to auscultation without rales, wheezing or rhonchi  ABDOMEN: Soft, non-tender, non-distended MUSCULOSKELETAL:  Trace LLE  edema; No deformity  SKIN: Warm and dry NEUROLOGIC:  Alert and oriented x 3 PSYCHIATRIC:  Normal affect   ASSESSMENT:    #Acute on chronic  combined systolic and diastolic heart failure  multifactorial in the setting of volume overload secondary to transfusion and severe protein-calorie malnutrition with recent weight loss. Currently stable- I did suggest he stop K+ supplement.   #Multivessel CAD s/p CABG Last cath in 2017 with widely patent previously placed bypass grafts including LIMA to mid LAD, saphenous graft to diagonal, and saphenous vein graft to obtuse marginal #1/ramus intermedius.Known CTO of RCA with left to right collaterals. No current chest pain with low suspicion of ischemia.  #Hyperlipidemia -Continue statin and Lovaza - LDL 34 02/21/20  #Hypertension -Continue amlodipine 5 mg daily, bisoprolol 10 mg daily -Continue entresto  #Diabetes type 2 -SSI - A1C 6.  #Iron deficiency anemia #Renal Mass -Currently undergoing extensive work-up for underlying anemia and renal mass at [facility]. He tells me he is scheduled for nephrectomy 08/06/2020.  PLAN:    Stop K+ supplement.  Continue other medications as listed (I did not add Aldactone with borderline low B/P and high K+).   The patient is an acceptable risk for nephrectomy without further cardiac work up.   F/U Dr Johnsie Cancel in Jan 2022   Medication Adjustments/Labs and Tests Ordered: Current medicines are reviewed at length with the patient today.  Concerns regarding medicines are outlined above.  No orders of the defined types were placed in this encounter.  No orders of the defined types were placed in this encounter.   Patient Instructions  Medication Instructions:  Continue current medications  *If you need a refill on your cardiac medications before your next appointment, please call your pharmacy*   Lab Work: None Ordered   Testing/Procedures: None Ordered   Follow-Up: At [facility], you and your health needs are our priority.  As part of our continuing mission to provide you with exceptional heart care, we have created  designated Provider Care Teams.  These Care Teams include your primary Cardiologist (physician) and Advanced Practice Providers (APPs -  Physician Assistants and Nurse Practitioners) who all work together to provide you with the care you need, when you need it.  We recommend signing up for the patient portal called "MyChart".  Sign up information is provided on this After Visit Summary.  MyChart is used to connect with patients for Virtual Visits (Telemedicine).  Patients are able to view lab/test results, encounter notes, upcoming appointments, etc.  Non-urgent messages can be sent to your provider as well.   To learn more about what you can do with MyChart, go to [email or web address].    Your next appointment:   6 week(s)  The format for your next appointment:   In Person  Provider:   Dr Johnsie Cancel      Signed, Kerin Ransom, PA-C  07/22/2020 11:45 AM    [place]

## 2020-07-31 DIAGNOSIS — R471 Dysarthria and anarthria: Secondary | ICD-10-CM | POA: Diagnosis not present

## 2020-07-31 DIAGNOSIS — Z20822 Contact with and (suspected) exposure to covid-19: Secondary | ICD-10-CM | POA: Diagnosis not present

## 2020-07-31 DIAGNOSIS — E119 Type 2 diabetes mellitus without complications: Secondary | ICD-10-CM | POA: Diagnosis not present

## 2020-07-31 DIAGNOSIS — R4701 Aphasia: Secondary | ICD-10-CM | POA: Diagnosis not present

## 2020-07-31 DIAGNOSIS — R29810 Facial weakness: Secondary | ICD-10-CM | POA: Diagnosis not present

## 2020-07-31 DIAGNOSIS — E785 Hyperlipidemia, unspecified: Secondary | ICD-10-CM | POA: Diagnosis not present

## 2020-07-31 DIAGNOSIS — R29706 NIHSS score 6: Secondary | ICD-10-CM | POA: Diagnosis not present

## 2020-07-31 DIAGNOSIS — I5020 Unspecified systolic (congestive) heart failure: Secondary | ICD-10-CM | POA: Diagnosis not present

## 2020-07-31 DIAGNOSIS — C642 Malignant neoplasm of left kidney, except renal pelvis: Secondary | ICD-10-CM | POA: Diagnosis not present

## 2020-07-31 DIAGNOSIS — I509 Heart failure, unspecified: Secondary | ICD-10-CM | POA: Diagnosis not present

## 2020-07-31 DIAGNOSIS — I110 Hypertensive heart disease with heart failure: Secondary | ICD-10-CM | POA: Diagnosis not present

## 2020-07-31 DIAGNOSIS — R32 Unspecified urinary incontinence: Secondary | ICD-10-CM | POA: Diagnosis not present

## 2020-07-31 DIAGNOSIS — I2510 Atherosclerotic heart disease of native coronary artery without angina pectoris: Secondary | ICD-10-CM | POA: Diagnosis not present

## 2020-07-31 DIAGNOSIS — I10 Essential (primary) hypertension: Secondary | ICD-10-CM | POA: Diagnosis not present

## 2020-07-31 DIAGNOSIS — I6389 Other cerebral infarction: Secondary | ICD-10-CM | POA: Diagnosis not present

## 2020-07-31 DIAGNOSIS — I639 Cerebral infarction, unspecified: Secondary | ICD-10-CM | POA: Diagnosis not present

## 2020-07-31 DIAGNOSIS — I4510 Unspecified right bundle-branch block: Secondary | ICD-10-CM | POA: Diagnosis not present

## 2020-07-31 DIAGNOSIS — I63512 Cerebral infarction due to unspecified occlusion or stenosis of left middle cerebral artery: Secondary | ICD-10-CM | POA: Diagnosis not present

## 2020-07-31 DIAGNOSIS — D4102 Neoplasm of uncertain behavior of left kidney: Secondary | ICD-10-CM | POA: Diagnosis not present

## 2020-08-04 ENCOUNTER — Ambulatory Visit (INDEPENDENT_AMBULATORY_CARE_PROVIDER_SITE_OTHER): Payer: BC Managed Care – PPO | Admitting: Family Medicine

## 2020-08-04 ENCOUNTER — Encounter: Payer: Self-pay | Admitting: Family Medicine

## 2020-08-04 ENCOUNTER — Telehealth: Payer: Self-pay | Admitting: Cardiovascular Disease

## 2020-08-04 ENCOUNTER — Other Ambulatory Visit: Payer: Self-pay

## 2020-08-04 VITALS — BP 132/76 | HR 86 | Temp 98.3°F | Resp 18 | Ht 71.0 in | Wt 170.4 lb

## 2020-08-04 DIAGNOSIS — I5043 Acute on chronic combined systolic (congestive) and diastolic (congestive) heart failure: Secondary | ICD-10-CM | POA: Diagnosis not present

## 2020-08-04 DIAGNOSIS — I10 Essential (primary) hypertension: Secondary | ICD-10-CM

## 2020-08-04 DIAGNOSIS — I639 Cerebral infarction, unspecified: Secondary | ICD-10-CM

## 2020-08-04 NOTE — Telephone Encounter (Signed)
    Pt's wife calling back to follow up appt. She wants pt to be seen this week. Advised RN already send it to schedule and see if pt can work in the office this week. She understood

## 2020-08-04 NOTE — Patient Instructions (Addendum)
It was very nice to see you today!  We will try to get you in to see the cardiologist.   Please hold the aspirin until you see the cardiologist.   Please stop the glimepiride.  Please make sure that you are taking canagliflozin/Metformin.  Take care, Dr Jerline Pain  Please try these tips to maintain a healthy lifestyle:   Eat at least 3 REAL meals and 1-2 snacks per day.  Aim for no more than 5 hours between eating.  If you eat breakfast, please do so within one hour of getting up.    Each meal should contain half fruits/vegetables, one quarter protein, and one quarter carbs (no bigger than a computer mouse)   Cut down on sweet beverages. This includes juice, soda, and sweet tea.     Drink at least 1 glass of water with each meal and aim for at least 8 glasses per day   Exercise at least 150 minutes every week.

## 2020-08-04 NOTE — Telephone Encounter (Signed)
New message:     Patient wife calling stating that her husband had a stroke last Thursday and patient was at [facility]. Heart function in November between 40 and 50. Now it is 25 to 20. paitent wife would like for him to see some one this week.

## 2020-08-04 NOTE — Telephone Encounter (Signed)
Spoke with the patients wife who is upset regarding the health of her husband. She states that he has kidney cancer and had a recent stroke. They were told that his EF has dropped from 45-50 down to 20-25%. She is requesting to see Dr. Eden Emms or Dr. Shari Prows immediately. Offered a virtual visit with PN on 1/11. She states that this is not soon enough and VV will not suffice. No earlier appointments available with MDs or APPs at this time.   Advised that I would send a message to Dr. Ricki Miller RN and scheduling team to see if there is an earlier appointment available. She verbalized understanding.

## 2020-08-04 NOTE — Progress Notes (Signed)
Chief Complaint:  Larry Garner is a 64 y.o. male who presents today for a TCM visit.  Assessment/Plan:  New/Acute Problems: CVA Thankfully most of his symptoms have resolved.  Based on work-up seems to be embolic source.  No sign of A. fib while hospitalized though currently has 1 day monitor in place.  He will need to follow-up with cardiology soon.  Continue Eliquis.  Chronic Problems Addressed Today: HFrEF / CAD s/p CABG Patient with significant reduction in EF over the last month.  Will attempt to get him into see cardiology.  May need catheterization based on cardiology's recommendations from [facility] so he will await evaluation from his primary cardiologist.  No signs of volume overload today.  We will continue Entresto and bisoprolol.  Will take losartan off of his medication list.  Essential Hypertension At goal.  Will take losartan off med list as he is on Entresto.  T2DM Clarified patient's medication regimen.  He was discharged on Amaryl from most recent hospitalization.  Per the last PCP note this was supposed to be discontinued and he was was to continue with Invokamet.  We will update med list today to reflect changes from his PCP.    Subjective:  HPI:  Summary of Hospital admission: Reason for admission: Stroke Date of admission: 07/31/2020 Date of discharge: 08/02/2020 Date of Interactive contact: N/a today's visit is within 2 days of discharge. Summary of Hospital course: Patient presented to the ED on 07/21/2020 with altered mental status and dysarthria.  Had MRI which showed left inferior parietal lobe in left postcentral gyrus infarcts.  He was admitted for stroke evaluation.  EKG did not show any significant arrhythmias.  TTE showed left ventricular ejection fraction of 20 to 25%.  CTA neck did not show any significant stenosis.  Due to distribution of stroke there was concern for cardioembolic source.  He was started on Eliquis.  He was discharged home in  stable condition to follow-up with neurology and home health physical therapy.  Interim history:  He has done well since being home.  His speech has returned back to almost baseline.  No residual weakness or numbness.  He is very concerned about the reduction in his ejection fraction.  About a month ago he had echo performed and it was 45 to 50%.  He has not noticed any obvious shortness of breath or DOE.   No leg swelling.  He has been tolerating Eliquis well.  Currently has a heart monitor in place and will be returning to [facility] at the end of 14 days.  ROS: Per HPI, otherwise a complete review of systems was negative.   PMH:  The following were reviewed and entered/updated in epic: Past Medical History:  Diagnosis Date  . Allergic rhinitis   . Allergy   . Anxiety   . Asthma   . CHF (congestive heart failure) ([place])   . Diabetes ([place])   . Diverticulitis   . Dyspnea   . GERD (gastroesophageal reflux disease)    very occ  . History of heart attack   . HLD (hyperlipidemia)   . HTN (hypertension)   . Myocardial infarction ([place]) 2000  . Sleep apnea    wears cpap   . Tubular adenoma of colon 09/2008   Patient Active Problem List   Diagnosis Date Noted  . Acute on chronic combined systolic and diastolic CHF (congestive heart failure) ([place]) 07/17/2020  . Renal cell carcinoma ([place]) 07/17/2020  . Protein-calorie malnutrition,  severe 07/08/2020  . Acute respiratory failure with hypoxia ([place]) 07/05/2020  . Iron deficiency anemia due to chronic blood loss 06/19/2020  . OSA (obstructive sleep apnea) 01/09/2018  . Pulmonary nodule, left 03/15/2017  . Former smoker 11/04/2014  . Insomnia 05/27/2014  . Plantar wart of left foot 11/27/2010  . Type 2 diabetes mellitus with other specified complication ([place]) [id number]  . PSA, INCREASED 09/15/2009  . Diverticulitis of colon 05/08/2009  . Asthma, moderate persistent 06/19/2008  . ANKLE PAIN, CHRONIC 06/05/2007  . Allergic rhinitis  05/17/2007  . ERECTILE DYSFUNCTION, SECONDARY TO MEDICATION 05/17/2007  . Hyperlipidemia 04/06/2007  . Anxiety state 04/06/2007  . Essential hypertension 04/06/2007  . Coronary artery disease involving native coronary artery of native heart without angina pectoris 04/06/2007   Past Surgical History:  Procedure Laterality Date  . CARDIAC CATHETERIZATION N/A 07/20/2016   Procedure: Left Heart Cath and Cors/Grafts Angiography;  Surgeon: Belva Crome, MD;  Location: [place] [facility];  Service: Cardiovascular;  Laterality: N/A;  . CATARACT EXTRACTION Bilateral 06/06/2019   Dr. Tommy Rainwater. oct left, dec right 2020   . COLONOSCOPY    . CORONARY ANGIOPLASTY    . CORONARY ARTERY BYPASS GRAFT  06/1999  . POLYPECTOMY    . RENAL BIOPSY  06/2020  . TONSILLECTOMY     late50's early 81's    Family History  Problem Relation Age of Onset  . Hypertension Mother   . Alzheimer's disease Mother   . Colon polyps Mother   . Hyperlipidemia Father   . Hypertension Father   . Colon polyps Father   . Colon cancer Paternal Aunt   . Pancreatic cancer Neg Hx   . Stomach cancer Neg Hx   . Thyroid disease Neg Hx   . Esophageal cancer Neg Hx   . Rectal cancer Neg Hx     Medications- Reconciled discharge and current medications in Epic.  Current Outpatient Medications  Medication Sig Dispense Refill  . albuterol (VENTOLIN HFA) 108 (90 Base) MCG/ACT inhaler Inhale 2 puffs into the lungs every 6 (six) hours as needed for wheezing or shortness of breath. 18 g 11  . ALPRAZolam (XANAX) 0.5 MG tablet Take 1 tablet (0.5 mg total) by mouth every 6 (six) hours as needed. (Patient taking differently: Take 0.5 mg by mouth every 6 (six) hours as needed for anxiety.) 30 tablet 3  . amLODipine (NORVASC) 5 MG tablet TAKE 1 TABLET BY MOUTH EVERY DAY (Patient taking differently: Take 5 mg by mouth daily.) 90 tablet 1  . apixaban (ELIQUIS) 5 MG TABS tablet Take 5 mg by mouth in the morning and at bedtime.    Marland Kitchen aspirin EC 81  MG tablet Take 1 tablet (81 mg total) by mouth daily. 90 tablet 3  . Azelastine-Fluticasone 137-50 MCG/ACT SUSP Place 1 spray into the nose daily as needed (allergies).     . Canagliflozin-metFORMIN HCl (INVOKAMET) [phone] MG TABS Take 1 tablet by mouth 2 (two) times daily. 180 tablet 3  . cyanocobalamin 1000 MCG tablet Take 1,000 mcg by mouth daily.    . famciclovir (FAMVIR) 500 MG tablet TAKE 3 TABLETS BY MOUTH EVERY DAY AS NEEDED AT FIRST SIGN OF OUTBREAK OF FEVER BLISTER 15 tablet 1  . folic acid (FOLVITE) [id number] MCG tablet Take 1,600 mcg by mouth daily.    . furosemide (LASIX) 40 MG tablet Take 40mg  twice daily for 3 days followed by once daily 60 tablet 1  . glucose blood (BAYER CONTOUR NEXT TEST) test strip  Use to test blood sugars daily. Dx: E11.9 100 each 12  . Multiple Vitamin (MULTIVITAMIN) tablet Take 1 tablet by mouth daily.    . nitroGLYCERIN (NITROSTAT) 0.4 MG SL tablet PLACE 1 TABLET UNDER THE TONGUE EVERY 5 MINUTES AS NEEDED FOR CHEST PAIN. (Patient taking differently: Place 0.4 mg under the tongue every 5 (five) minutes x 3 doses as needed for chest pain.) 25 tablet 1  . rosuvastatin (CRESTOR) 20 MG tablet TAKE 1 TABLET BY MOUTH DAILY (Patient taking differently: Take 20 mg by mouth daily.) 90 tablet 4  . sacubitril-valsartan (ENTRESTO) 24-26 MG Take 1 tablet by mouth 2 (two) times daily. 60 tablet 2  . Semaglutide,0.25 or 0.5MG /DOS, (OZEMPIC, 0.25 OR 0.5 MG/DOSE,) 2 MG/1.5ML SOPN Inject 0.25 mg into the skin once a week. Sunday    . zaleplon (SONATA) 10 MG capsule TAKE 1 CAPSULE (10 MG TOTAL) BY MOUTH AT BEDTIME AS NEEDED. FOR SLEEP (Patient taking differently: Take 10 mg by mouth at bedtime as needed for sleep.) 30 capsule 5  . bisoprolol (ZEBETA) 10 MG tablet Take 1 tablet (10 mg total) by mouth daily. (Patient not taking: Reported on 08/04/2020) 30 tablet 2  . oxymetazoline (AFRIN) 0.05 % nasal spray Place 1 spray into both nostrils 2 (two) times daily as needed for congestion.  (Patient not taking: Reported on 08/04/2020)    . potassium chloride SA (KLOR-CON) 20 MEQ tablet Take 1 tablet (20 mEq total) by mouth daily. (Patient not taking: Reported on 08/04/2020) 30 tablet 1   No current facility-administered medications for this visit.    Allergies-reviewed and updated Allergies  Allergen Reactions  . Morphine Sulfate Nausea And Vomiting and Swelling    Social History   Socioeconomic History  . Marital status: Married    Spouse name: Not on file  . Number of children: Not on file  . Years of education: Not on file  . Highest education level: Not on file  Occupational History  . Occupation: Banker  Tobacco Use  . Smoking status: Former Smoker    Packs/day: 0.50    Years: 4.00    Pack years: 2.00    Types: Cigarettes    Quit date: 09/29/1977    Years since quitting: 42.8  . Smokeless tobacco: Never Used  . Tobacco comment: quit on 1980  Vaping Use  . Vaping Use: Never used  Substance and Sexual Activity  . Alcohol use: Not Currently  . Drug use: No  . Sexual activity: Yes  Other Topics Concern  . Not on file  Social History Narrative   Married  In 1981 with 2 kids (son and daughter). No grandkids.       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)      Hobbies: active in church, sings for church, travel   Social Determinants of Radio broadcast assistant Strain: Not on [employer] Insecurity: Not on file  Transportation Needs: Not on file  Physical Activity: Not on file  Stress: Not on file  Social Connections: Not on file        Objective:  Physical Exam: BP 132/76   Pulse 86   Temp 98.3 F (36.8 C) (Temporal)   Resp 18   Ht 5\' 11"  (1.803 m)   Wt 170 lb 6.4 oz (77.3 kg)   SpO2 97%   BMI 23.77 kg/m   Gen: NAD, resting comfortably CV: RRR with no murmurs appreciated Pulm: NWOB, CTAB with no crackles, wheezes, or rhonchi GI: Normal bowel sounds  present. Soft, Nontender, Nondistended. MSK: No edema, cyanosis, or clubbing noted Skin:  Warm, dry Neuro: Grossly normal, moves all extremities.  Normal speech. Psych: Normal affect and thought content  Time Spent: 55 minutes of total time was spent on the date of the encounter performing the following actions: chart review prior to seeing the patient including records from most recent hospitalization in care everywhere, obtaining history, performing a medically necessary exam, counseling on the treatment plan, placing orders, and documenting in our EHR.       Algis Greenhouse. Jerline Pain, MD 08/04/2020 4:32 PM

## 2020-08-05 ENCOUNTER — Encounter: Payer: Self-pay | Admitting: Physician Assistant

## 2020-08-05 ENCOUNTER — Ambulatory Visit (INDEPENDENT_AMBULATORY_CARE_PROVIDER_SITE_OTHER): Payer: BC Managed Care – PPO | Admitting: Physician Assistant

## 2020-08-05 ENCOUNTER — Telehealth: Payer: Self-pay | Admitting: Cardiovascular Disease

## 2020-08-05 VITALS — BP 120/60 | HR 83 | Ht 71.0 in | Wt 170.0 lb

## 2020-08-05 DIAGNOSIS — I10 Essential (primary) hypertension: Secondary | ICD-10-CM

## 2020-08-05 DIAGNOSIS — C642 Malignant neoplasm of left kidney, except renal pelvis: Secondary | ICD-10-CM

## 2020-08-05 DIAGNOSIS — I5020 Unspecified systolic (congestive) heart failure: Secondary | ICD-10-CM | POA: Diagnosis not present

## 2020-08-05 DIAGNOSIS — I2510 Atherosclerotic heart disease of native coronary artery without angina pectoris: Secondary | ICD-10-CM | POA: Diagnosis not present

## 2020-08-05 DIAGNOSIS — Z8673 Personal history of transient ischemic attack (TIA), and cerebral infarction without residual deficits: Secondary | ICD-10-CM

## 2020-08-05 DIAGNOSIS — E1169 Type 2 diabetes mellitus with other specified complication: Secondary | ICD-10-CM

## 2020-08-05 MED ORDER — SPIRONOLACTONE 25 MG PO TABS: 12.5000 mg | ORAL_TABLET | Freq: Every day | ORAL | 3 refills | Status: DC

## 2020-08-05 NOTE — Progress Notes (Addendum)
Cardiology Office Note:    Date:  08/05/2020   ID:  Larry Garner, DOB [birth year], MRN [id number]  PCP:  Marin Olp, MD  [facility] Cardiologist:  Jenkins Rouge, MD   [place] Electrophysiologist:  None   Referring MD: Marin Olp, MD   Chief Complaint:  Hospitalization Follow-up (S/p CVA; EF much lower on echo)    Patient Profile:    Larry Garner is a 64 y.o. 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 (L MCA territory) 07/2020  admx to [place] >> EF 20-25 >> Started on empiric anticoagulation with Apixaban   Diabetes mellitus   Hypertension   Hyperlipidemia   OSA   GERD   Renal Cell CA  Prior CV studies: Echocardiogram 07/31/2020 EF 20-25, normal RVSF, mild BAE, mild MR, mild-moderate TR, RVSP 49  Echocardiogram 07/06/20 EF 45-50, mild LVH, Gr 2 DD, inf and apical AK, low normal RVSF mod LAE, mild MR, trivial AI, RVSP 47.7  Cardiac catheterization 07/20/2016 LAD proximal stent 100, distal 50; D1 60-70, D2 100 RI 100 RCA mid 100 LIMA-LAD patent SVG-DX patent SVG-OM1/RI patent EF 45-50   History of Present Illness:    Larry Garner was last seen in clinic by Kerin Ransom, PA-C 07/22/20.  He had been admitted with volume overload after a renal bx at [facility].  He had received an Iron infusion and transfusion with PRBCs.  He was diuresed and placed on Entresto.  He was overall stable when he saw [facility].  He has been diagnosed with renal cell carcinoma.  He is to have a nephrectomy soon.    He was admitted to [facility] 12/23-12/25 with an acute left MCA ischemic stroke.  He had been in his urologist office on the date of admission to  arrange his nephrectomy.  However given his symptoms, he was sent to the emergency room.  MRI demonstrated acute versus early subacute infarcts involving the left inferior parietal lobe as well as the cortex along the left postcentral gyrus and subcortical white matter deep to the superior left central sulcus.  Echocardiogram demonstrated EF 20-25.  Due to his low EF, it was felt that his stroke was likely embolic and he was placed on anticoagulation with Apixaban.  He was seen by cardiology.  Zio patch monitor was arranged at discharge.  He returns for follow-up.  He is here today with his wife.  Overall, he is doing well.  He still has some memory difficulties related to his stroke.  He does not have any unilateral weakness.  He has not had chest discomfort, jaw or back discomfort.  He does get short of breath with some activities such as walking up steps.  He has not had orthopnea.  His left leg is always more swollen than his right.  This is overall stable.  He has not had syncope or near syncope  Past Medical History:  Diagnosis Date  . Allergic rhinitis   . Allergy   . Anxiety   . Asthma   . CHF (congestive heart failure) ([place])   . Diabetes ([place])   . Diverticulitis   .  Dyspnea   . GERD (gastroesophageal reflux disease)    very occ  . History of heart attack   . HLD (hyperlipidemia)   . HTN (hypertension)   . Myocardial infarction (HCC) 2000  . Sleep apnea    wears cpap   . Tubular adenoma of colon 09/2008    Current Medications: Current Meds  Medication Sig  . albuterol (VENTOLIN HFA) 108 (90 Base) MCG/ACT inhaler Inhale 2 puffs into the lungs every 6 (six) hours as needed for wheezing or shortness of breath.  . ALPRAZolam (XANAX) 0.5 MG tablet Take 1 tablet (0.5 mg total) by mouth every 6 (six) hours as needed.  Marland Kitchen amLODipine (NORVASC) 5 MG tablet TAKE 1 TABLET BY MOUTH EVERY DAY  . apixaban (ELIQUIS) 5 MG TABS tablet Take 5 mg by mouth in the morning and at bedtime.  Marland Kitchen aspirin  EC 81 MG tablet Take 1 tablet (81 mg total) by mouth daily.  . Azelastine-Fluticasone 137-50 MCG/ACT SUSP Place 1 spray into the nose daily as needed (allergies).   . bisoprolol (ZEBETA) 10 MG tablet Take 1 tablet (10 mg total) by mouth daily.  . cyanocobalamin 1000 MCG tablet Take 1,000 mcg by mouth daily.  . famciclovir (FAMVIR) 500 MG tablet TAKE 3 TABLETS BY MOUTH EVERY DAY AS NEEDED AT FIRST SIGN OF OUTBREAK OF FEVER BLISTER  . folic acid (FOLVITE) 800 MCG tablet Take 1,600 mcg by mouth daily.  . furosemide (LASIX) 40 MG tablet Take 40mg  twice daily for 3 days followed by once daily  . glucose blood (BAYER CONTOUR NEXT TEST) test strip Use to test blood sugars daily. Dx: E11.9  . Multiple Vitamin (MULTIVITAMIN) tablet Take 1 tablet by mouth daily.  . nitroGLYCERIN (NITROSTAT) 0.4 MG SL tablet PLACE 1 TABLET UNDER THE TONGUE EVERY 5 MINUTES AS NEEDED FOR CHEST PAIN.  07-30-1984 oxymetazoline (AFRIN) 0.05 % nasal spray Place 1 spray into both nostrils 2 (two) times daily as needed for congestion.  . potassium chloride SA (KLOR-CON) 20 MEQ tablet Take 1 tablet (20 mEq total) by mouth daily.  . rosuvastatin (CRESTOR) 20 MG tablet TAKE 1 TABLET BY MOUTH DAILY  . sacubitril-valsartan (ENTRESTO) 24-26 MG Take 1 tablet by mouth 2 (two) times daily.  . Semaglutide,0.25 or 0.5MG /DOS, (OZEMPIC, 0.25 OR 0.5 MG/DOSE,) 2 MG/1.5ML SOPN Inject 0.25 mg into the skin once a week. Sunday  . spironolactone (ALDACTONE) 25 MG tablet Take 0.5 tablets (12.5 mg total) by mouth daily.  . zaleplon (SONATA) 10 MG capsule TAKE 1 CAPSULE (10 MG TOTAL) BY MOUTH AT BEDTIME AS NEEDED. FOR SLEEP     Allergies:   Morphine sulfate   Social History   Tobacco Use  . Smoking status: Former Smoker    Packs/day: 0.50    Years: 4.00    Pack years: 2.00    Types: Cigarettes    Quit date: 09/29/1977    Years since quitting: 42.8  . Smokeless tobacco: Never Used  . Tobacco comment: quit on 1980  Vaping Use  . Vaping Use: Never used   Substance Use Topics  . Alcohol use: Not Currently  . Drug use: No     Family Hx: The patient's family history includes Alzheimer's disease in his mother; Colon cancer in his paternal aunt; Colon polyps in his father and mother; Hyperlipidemia in his father; Hypertension in his father and mother. There is no history of Pancreatic cancer, Stomach cancer, Thyroid disease, Esophageal cancer, or Rectal cancer.  ROS   EKGs/Labs/Other Test Reviewed:  EKG:  EKG is  ordered today.  The ekg ordered today demonstrates normal sinus rhythm, heart rate 83, leftward axis, right bundle branch block, T wave inversions 1, aVL, 2, aVF, V3-V6, QTC 531 (manual measurement by me calculates to 475 ms), similar to prior EKG  Recent Labs: 07/05/2020: B Natriuretic Peptide 1,435.5; Magnesium 2.1; TSH 0.369 07/17/2020: ALT 16; BUN 13; Creat 0.82; Hemoglobin 8.6; Platelets 396; Potassium 4.5; Sodium 134   Recent Lipid Panel Lab Results  Component Value Date/Time   CHOL 97 02/21/2020 04:52 PM   TRIG 175.0 (H) 02/21/2020 04:52 PM   HDL 27.60 (L) 02/21/2020 04:52 PM   CHOLHDL 4 02/21/2020 04:52 PM   LDLCALC 34 02/21/2020 04:52 PM   LDLDIRECT 54.0 01/16/2019 09:30 AM   Labs obtained through care everywhere- personally reviewed and interpreted: 08/02/2020: K+ 4.2, creatinine 0.75 07/31/2020: Na 131, K+ 4.5, BUN 26, creatinine 0.89, calcium 8.4, albumin 2.8, ALT 22  Risk Assessment/Calculations:      Physical Exam:    VS:  BP 120/60   Pulse 83   Ht 5\' 11"  (1.803 m)   Wt 170 lb (77.1 kg)   SpO2 98%   BMI 23.71 kg/m     Wt Readings from Last 3 Encounters:  08/05/20 170 lb (77.1 kg)  08/04/20 170 lb 6.4 oz (77.3 kg)  07/22/20 171 lb (77.6 kg)     Constitutional:      Appearance: Healthy appearance. Not in distress.  Neck:     Vascular: JVD normal.  Pulmonary:     Effort: Pulmonary effort is normal.     Breath sounds: No wheezing. No rales.  Cardiovascular:     Normal rate. Regular rhythm.  Normal S1. Normal S2.     Murmurs: There is no murmur.  Edema:    Pretibial: 1+ edema of the left pretibial area and trace edema of the right pretibial area.    Ankle: 1+ edema of the left ankle and trace edema of the right ankle. Abdominal:     Palpations: Abdomen is soft. There is no hepatomegaly.  Skin:    General: Skin is warm and dry.  Neurological:     General: No focal deficit present.     Mental Status: Alert and oriented to person, place and time.     Cranial Nerves: Cranial nerves are intact.      ASSESSMENT & PLAN:    1. HFrEF (heart failure with reduced ejection fraction) (HCC) Ejection fraction was 45-50% by echocardiogram in November.  However, during his recent hospitalization to [facility] with acute CVA, his EF is now 20-25%.  Etiology of his worsening cardiomyopathy is not entirely clear.  He has not had symptoms to suggest angina.  He does need nephrectomy soon due to renal cell carcinoma.  He was actually in the urologist's office when he was admitted for his stroke.  He is NYHA IIb.  His volume status seems to be fairly stable.  He has chronic left lower extremity swelling since his bypass surgery.  He is on a fairly good medical regimen with bisoprolol, Entresto.  He was previously on canagliflozin/Metformin.  His wife notes that he does not have a Rx for this.  It is not clear if it was held at [facility] while he was there for his CVA.  Overall, we need to review his images from [facility] along with the images from [facility] in 06/2020.  If his EF has truly dropped that much since Nov, he may  need a cardiac catheterization prior to proceeding with his nephrectomy.  I need to get him back in ASAP with his primary cardiologist, Dr. Johnsie Cancel, to make a disposition.    -Continue current dose of Entresto, Bisoprolol  -Add Spironolactone 12.5 mg once daily   -BMET q week x 2  -Consider increasing dose of Entresto at next visit  -F/u with PCP to see if SGLT2i (Invokamet)  can be resumed  -Obtain Echocardiogram images from [place]  -F/u with Dr. Johnsie Cancel in the next 1-2 weeks  2. History of stroke With his reduced EF and probable embolic stroke, he was placed on Apixaban for anticoagulation.  It looks like neurology at [facility] (according to the progress notes) also wanted him to be on ASA 81 mg once daily.  I have asked him to resume this.  He is tolerating Apixaban at this time.  As noted above, we will obtain the echocardiogram images from [place] to compare with the images from November.  If he is EF is truly down that much since November, we may need to consider cardiac catheterization.  Ideally, cardiac catheterization should be avoided given his recent stroke.  However, given the urgency of his nephrectomy for renal cell carcinoma, we may be forced to proceed with cardiac catheterization earlier.  We will also try to get the results of his Zio monitor when they are available.  Our tech in our office has been in contact with the representative for Zio to help with obtaining the results.  3. Coronary artery disease involving native coronary artery of native heart without angina pectoris History of CABG in 2000.  Cardiac catheterization 2017 demonstrated patent bypass grafts and complete occlusion of the RCA.  He has been managed medically.  It had been recommended that CTO PCI could be considered of the RCA if he had refractory angina.  He is currently doing well without anginal symptoms.  Continue aspirin, bisoprolol, rosuvastatin.  4. Renal cell carcinoma of left kidney (HCC) As noted, he needs to undergo left nephrectomy soon.  I will try to get back on Dr. Kyla Balzarine schedule in the next 1 to 2 weeks to expedite his disposition peer  5. Type 2 diabetes mellitus with other specified complication, without long-term current use of insulin (HCC) As noted, I have asked him and his wife to contact his PCP to see if canagliflozin/Metformin can be resumed.  The SGLT2 inhibitor would  benefit his heart failure if it can be resumed.  6. Essential hypertension The patient's blood pressure is controlled on his current regimen.  Continue current therapy. At f/u, consider DC Amlodipine and increasing dose of Entresto.       Dispo:  Return in about 1 week (around 08/12/2020) for Close Follow Up w/ Dr. Johnsie Cancel.   Medication Adjustments/Labs and Tests Ordered: Current medicines are reviewed at length with the patient today.  Concerns regarding medicines are outlined above.  Tests Ordered: Orders Placed This Encounter  Procedures  . Basic metabolic panel  . Basic metabolic panel  . EKG 12-Lead   Medication Changes: Meds ordered this encounter  Medications  . spironolactone (ALDACTONE) 25 MG tablet    Sig: Take 0.5 tablets (12.5 mg total) by mouth daily.    Dispense:  45 tablet    Refill:  3    Signed, Richardson Dopp, PA-C  08/05/2020 2:17 PM    [facility] [place], [place], [place]  [address] Phone: [phone]; Fax: [phone]

## 2020-08-05 NOTE — Patient Instructions (Addendum)
Medication Instructions:  Your physician has recommended you make the following change in your medication:   1) Start Spironolactone 25 mg, 0.5 tablet by mouth once a day  *If you need a refill on your cardiac medications before your next appointment, please call your pharmacy*  Lab Work: Your physician recommends that you return for lab work in 1 week on 08/12/20 Your physician recommends that you return for lab work in: 2 weeks on 08/19/20  Testing/Procedures: None ordered today  Follow-Up: At [facility], you and your health needs are our priority.  As part of our continuing mission to provide you with exceptional heart care, we have created designated Provider Care Teams.  These Care Teams include your primary Cardiologist (physician) and Advanced Practice Providers (APPs -  Physician Assistants and Nurse Practitioners) who all work together to provide you with the care you need, when you need it.  Your next appointment:   2 week(s) on 08/19/20 at 11:15AM  The format for your next appointment:   In Person  Provider:   Charlton Haws, MD

## 2020-08-05 NOTE — Telephone Encounter (Signed)
Called and made patient an appointment with Tereso Newcomer PA for follow up after being in hospital for a stroke and discovering EF has decreased. Patient was suppose to have surgery tomorrow on his kidneys due to having cancer. This surgery has been postpone due to drop in EF. Patient's wife stated patient cannot have surgery until his EF is back up. Wife stated patient needs appointment ASAP so they can get back on schedule for surgery. Informed patient's wife that EF will take a while to get back up. Patient just started Moody. Will have them discuss this with Tereso Newcomer PA when he sees them today.

## 2020-08-05 NOTE — Telephone Encounter (Signed)
Called Summer with [facility] and informed her that provider is aware patient is on potassium and that we are following up with lab work in the next week.

## 2020-08-05 NOTE — Telephone Encounter (Signed)
Patient's wife, Waynetta Sandy, is following up regarding her request to have the patient scheduled for an emergency appointment this week. She states the patient was at the hospital with a stroke and the cardiologist who saw the patient in the hospital stated the patient needs to be seen by cardiology ASAP.

## 2020-08-05 NOTE — Telephone Encounter (Signed)
Made patient an appointment today with Tereso Newcomer PA.

## 2020-08-05 NOTE — Telephone Encounter (Signed)
Pt c/o medication issue:  1. Name of Medication: spironolactone (ALDACTONE) 25 MG tablet  2. How are you currently taking this medication (dosage and times per day)? N/A  3. Are you having a reaction (difficulty breathing--STAT)? No  4. What is your medication issue? Summer with [facility] is calling to inform Dr. Eden Emms and Tereso Newcomer of a possible drug interaction. She states the patient is also taking Potassium, prescribed by another office and she states taking both medications may cause side effects. Please return call to discuss.  Phone #[phone]

## 2020-08-05 NOTE — Telephone Encounter (Signed)
Pt told me today that he was no longer on K+ today at his OV. Please confirm.  If he is taking, please let me know what dose so that I can decrease it/stop it with the addition of Spironolactone. Tereso Newcomer, PA-C    08/05/2020 5:27 PM

## 2020-08-06 DIAGNOSIS — Z7982 Long term (current) use of aspirin: Secondary | ICD-10-CM | POA: Diagnosis not present

## 2020-08-06 DIAGNOSIS — I69328 Other speech and language deficits following cerebral infarction: Secondary | ICD-10-CM | POA: Diagnosis not present

## 2020-08-06 DIAGNOSIS — Z79899 Other long term (current) drug therapy: Secondary | ICD-10-CM | POA: Diagnosis not present

## 2020-08-06 NOTE — Telephone Encounter (Signed)
Called patient this morning. Confirmed that Patient is not taking potassium. Updated patient's medication list.

## 2020-08-07 ENCOUNTER — Encounter: Payer: Self-pay | Admitting: Family Medicine

## 2020-08-07 ENCOUNTER — Telehealth: Payer: Self-pay | Admitting: Cardiovascular Disease

## 2020-08-07 NOTE — Telephone Encounter (Signed)
New Message:     Pt have had a stroke. Pt will need Speech and Occupational Therapy. Wife said she needs some recommendations from Dr Eden Emms please.

## 2020-08-07 NOTE — Telephone Encounter (Signed)
Will forward to Dr. Eden Emms for any recommendations for speech and occupational therapy.

## 2020-08-09 ENCOUNTER — Other Ambulatory Visit: Payer: Self-pay | Admitting: Family Medicine

## 2020-08-11 MED ORDER — INVOKAMET 150-1000 MG PO TABS: 1.0000 | ORAL_TABLET | Freq: Two times a day (BID) | ORAL | 3 refills | Status: DC

## 2020-08-11 NOTE — Telephone Encounter (Signed)
Called patient's wife back with Dr. Fabio Bering message. Patient's wife verbalized understanding.

## 2020-08-11 NOTE — Progress Notes (Signed)
Cardiology Office Note:    Date:  08/14/2020   ID:  Larry Garner, DOB 1956-02-22, MRN [id number]  PCP:  Shelva Majestic, MD  [facility] Cardiologist:  Charlton Haws, MD   [facility] Electrophysiologist:  None   Referring MD: Shelva Majestic, MD   Chief Complaint:  No chief complaint on file.    Patient Profile:    Larry Garner is a 65 y.o. 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  admx to [facility] >> EF 20-25 >> Started on empiric anticoagulation with Apixaban   Diabetes mellitus   Hypertension   Hyperlipidemia   OSA   GERD   Renal Cell CA  Prior CV studies: Echocardiogram 07/31/2020 EF 20-25, normal RVSF, mild BAE, mild MR, mild-moderate TR, RVSP 49  Echocardiogram 07/06/20 EF 45-50, mild LVH, Gr 2 DD, inf and apical AK, low normal RVSF mod LAE, mild MR, trivial AI, RVSP 47.7  Cardiac catheterization 07/20/2016 LAD proximal stent 100, distal 50; D1 60-70, D2 100 RI 100 RCA mid 100 LIMA-LAD patent SVG-DX patent SVG-OM1/RI patent EF 45-50   History of Present Illness:     65 y.o. with history of CAD/CABG patent grafts cath 2017 with occluded RCA collateralized not grafted Recent diagnosis renal cell carcinoma needs nephrectomy  Admitted to [facility] end of December with left parietal stroke Placed on eliquis and ASA. History of needing iv iron and transfusion.  At [facility] EF newly decreased to 20-25% from 45-50% previously He has not had any angina or just pain to suggest any interim  MI  ECG with chronic changes RBBB LAFB There is mention by PA of a Zio monitor being placed but I don't see any results Does not appear to have been  referred for ILR No mention of PAF in notes CareEverywhere   Long discussion about his recent hospitalization and took over 30 minutes to review his testing and notes From [facility]. He is still wearing a patch monitor presumably placed to r/o PAF instead of a ILR which I would Have preferred. He has not had symptoms of CHF , angina or palpitations. He is to start PT/OT and to start immunoRx for his renal cancer since surgery is being delayed due to recent stroke and less so because of his Heart.    Past Medical History:  Diagnosis Date  . Allergic rhinitis   . Allergy   . Anxiety   . Asthma   . CHF (congestive heart failure) (HCC)   . Diabetes (HCC)   . Diverticulitis   . Dyspnea   . GERD (gastroesophageal reflux disease)    very occ  . History of heart attack   . HLD (hyperlipidemia)   . HTN (hypertension)   . Myocardial infarction (HCC) 2000  . Sleep apnea    wears cpap   . Tubular adenoma of colon 09/2008    Current Medications: Current Meds  Medication Sig  . albuterol (VENTOLIN HFA) 108 (90 Base) MCG/ACT inhaler Inhale 2 puffs into the lungs every 6 (six) hours as needed for wheezing or shortness of breath.  . ALPRAZolam (XANAX) 0.5  MG tablet Take 1 tablet (0.5 mg total) by mouth every 6 (six) hours as needed.  Marland Kitchen amLODipine (NORVASC) 5 MG tablet TAKE 1 TABLET BY MOUTH EVERY DAY  . apixaban (ELIQUIS) 5 MG TABS tablet Take 1 tablet (5 mg total) by mouth in the morning and at bedtime.  Marland Kitchen aspirin EC 81 MG tablet Take 1 tablet (81 mg total) by mouth daily.  . Azelastine-Fluticasone 137-50 MCG/ACT SUSP Place 1 spray into the nose daily as needed (allergies).   . bisoprolol (ZEBETA) 10 MG tablet Take 1 tablet (10 mg total) by mouth daily.  . Canagliflozin-metFORMIN HCl (INVOKAMET) [phone] MG TABS Take 1 tablet by mouth 2 (two) times daily.  . cyanocobalamin 1000 MCG tablet Take 1,000 mcg by mouth daily.  . famciclovir (FAMVIR) 500 MG tablet TAKE 3 TABLETS BY MOUTH EVERY DAY AS  NEEDED AT FIRST SIGN OF OUTBREAK OF FEVER BLISTER  . folic acid (FOLVITE) [id number] MCG tablet Take 1,600 mcg by mouth daily.  . furosemide (LASIX) 40 MG tablet Take 40mg  twice daily for 3 days followed by once daily  . glucose blood (BAYER CONTOUR NEXT TEST) test strip Use to test blood sugars daily. Dx: E11.9  . Multiple Vitamin (MULTIVITAMIN) tablet Take 1 tablet by mouth daily.  . nitroGLYCERIN (NITROSTAT) 0.4 MG SL tablet PLACE 1 TABLET UNDER THE TONGUE EVERY 5 MINUTES AS NEEDED FOR CHEST PAIN.  Marland Kitchen oxymetazoline (AFRIN) 0.05 % nasal spray Place 1 spray into both nostrils 2 (two) times daily as needed for congestion.  . rosuvastatin (CRESTOR) 20 MG tablet TAKE 1 TABLET BY MOUTH DAILY  . sacubitril-valsartan (ENTRESTO) 24-26 MG Take 1 tablet by mouth 2 (two) times daily.  Marland Kitchen spironolactone (ALDACTONE) 25 MG tablet Take 0.5 tablets (12.5 mg total) by mouth daily.  . zaleplon (SONATA) 10 MG capsule TAKE 1 CAPSULE (10 MG TOTAL) BY MOUTH AT BEDTIME AS NEEDED. FOR SLEEP     Allergies:   Morphine sulfate   Social History   Tobacco Use  . Smoking status: Former Smoker    Packs/day: 0.50    Years: 4.00    Pack years: 2.00    Types: Cigarettes    Quit date: 09/29/1977    Years since quitting: 42.9  . Smokeless tobacco: Never Used  . Tobacco comment: quit on 1980  Vaping Use  . Vaping Use: Never used  Substance Use Topics  . Alcohol use: Not Currently  . Drug use: No     Family Hx: The patient's family history includes Alzheimer's disease in his mother; Colon cancer in his paternal aunt; Colon polyps in his father and mother; Hyperlipidemia in his father; Hypertension in his father and mother. There is no history of Pancreatic cancer, Stomach cancer, Thyroid disease, Esophageal cancer, or Rectal cancer.  ROS   EKGs/Labs/Other Test Reviewed:    EKG:  EKG is  ordered today.  The ekg ordered today demonstrates normal sinus rhythm, heart rate 83, leftward axis, right bundle branch block, T wave  inversions 1, aVL, 2, aVF, V3-V6, QTC 531 (manual measurement by me calculates to 475 ms), similar to prior EKG  Recent Labs: 07/05/2020: B Natriuretic Peptide 1,435.5; Magnesium 2.1; TSH 0.369 07/17/2020: ALT 16; Hemoglobin 8.6; Platelets 396 08/12/2020: BUN 23; Creatinine, Ser 0.80; Potassium 4.5; Sodium 132   Recent Lipid Panel Lab Results  Component Value Date/Time   CHOL 97 02/21/2020 04:52 PM   TRIG 175.0 (H) 02/21/2020 04:52 PM   HDL 27.60 (L) 02/21/2020 04:52 PM   CHOLHDL 4  02/21/2020 04:52 PM   LDLCALC 34 02/21/2020 04:52 PM   LDLDIRECT 54.0 01/16/2019 09:30 AM   Labs obtained through care everywhere- personally reviewed and interpreted: 08/02/2020: K+ 4.2, creatinine 0.75 07/31/2020: Na 131, K+ 4.5, BUN 26, creatinine 0.89, calcium 8.4, albumin 2.8, ALT 22  Risk Assessment/Calculations:      Physical Exam:    VS:  BP (!) 110/56   Pulse 77   Ht 5\' 11"  (1.803 m)   Wt 77.1 kg   SpO2 96%   BMI 23.71 kg/m     Wt Readings from Last 3 Encounters:  08/14/20 77.1 kg  08/05/20 77.1 kg  08/04/20 77.3 kg     Affect appropriate Older looking than stated age  80: normal Neck supple with no adenopathy JVP normal no bruits no thyromegaly Lungs clear with no wheezing and good diaphragmatic motion Heart:  S1/S2 no murmur, no rub, gallop or click PMI enlarged post sternotomy  Abdomen: benighn, BS positve, no tenderness, no AAA no bruit.  No HSM or HJR Distal pulses intact with no bruits No edema Neuro aphasic improved no other focal signs  Skin warm and dry No muscular weakness    ASSESSMENT & PLAN:    1. HFrEF (heart failure with reduced ejection fraction) (HCC) Ejection fraction was 45-50% by echocardiogram in November 2021 .  However, during his  hospitalization to [facility] with acute CVA, his EF is now 20-25%. 07/29/20 he is on GDMT he is euvolemic with no CHF see below in regard to stress testing f/u echo in 4-6 weeks      2. History  of stroke With his reduced EF and probable embolic stroke, he was placed on Apixaban for anticoagulation along with ASA by [facility] cardiology. Presented with dysarthria and AMS CT with left parieto-occipital infarct. No TPA or thrombectomy done CTA no carotid disease F/U neurology primary for OT/PT speech Rx Don't think their is a reason to do TEE at this point Would have preferred ILR rather than patch monitor he has on now but will see if this shows anything after being processed. FU echo will be with definity to make sure apex is well visualized   3. Coronary artery disease involving native coronary artery of native heart without angina pectoris History of CABG in 2000.  Cardiac catheterization 2017 demonstrated patent bypass grafts ( SVG D1, OM/IM and LIMA to LAD and complete occlusion of the RCA. RCA had both right to right and left to right collaterals   He has been managed medically.  It had been recommended that CTO PCI could be considered of the RCA if he had refractory angina.  Would like to avoid repeat cath given recent stroke and higher risk of stroke also imaging grafts. Will risk stratify with myovue   4. Renal cell carcinoma of left kidney (HCC) As noted, he needs to undergo left nephrectomy  Surgery will be delayed in setting of recent stroke and newly depressed EF To start immuno therapy at [facility]   5. Type 2 diabetes mellitus with other specified complication, without long-term current use of insulin (HCC) As noted, I have asked him and his wife to contact his PCP to see if canagliflozin/Metformin can be resumed.  The SGLT2 inhibitor would benefit his heart failure if it can be resumed.  6. Essential hypertension The patient's blood pressure is controlled on his current regimen.  Continue current therapy. At f/u, consider DC Amlodipine and increasing dose of Entresto.  Dispo:  F/U in 6-8 weeks   Medication Adjustments/Labs and Tests Ordered: Current medicines are reviewed at  length with the patient today.  Concerns regarding medicines are outlined above.  Tests Ordered:  Lexiscan myovue Echo with definity   Medication Changes: No orders of the defined types were placed in this encounter.   Signed, Jenkins Rouge, MD  08/14/2020 9:27 AM    [address], [place], [place]  [address] Phone: [phone]; Fax: [phone]

## 2020-08-11 NOTE — Telephone Encounter (Signed)
Should be arranged by neuro or primary

## 2020-08-12 ENCOUNTER — Other Ambulatory Visit: Payer: Self-pay

## 2020-08-12 ENCOUNTER — Telehealth: Payer: Self-pay | Admitting: Cardiovascular Disease

## 2020-08-12 ENCOUNTER — Encounter: Payer: Self-pay | Admitting: Psychology

## 2020-08-12 ENCOUNTER — Other Ambulatory Visit: Payer: Medicare Other | Admitting: *Deleted

## 2020-08-12 DIAGNOSIS — I5020 Unspecified systolic (congestive) heart failure: Secondary | ICD-10-CM

## 2020-08-12 DIAGNOSIS — I10 Essential (primary) hypertension: Secondary | ICD-10-CM

## 2020-08-12 LAB — BASIC METABOLIC PANEL
BUN/Creatinine Ratio: 29 — ABNORMAL HIGH (ref 10–24)
BUN: 23 mg/dL (ref 8–27)
CO2: 18 mmol/L — ABNORMAL LOW (ref 20–29)
Calcium: 8.8 mg/dL (ref 8.6–10.2)
Chloride: 98 mmol/L (ref 96–106)
Creatinine, Ser: 0.8 mg/dL (ref 0.76–1.27)
GFR calc Af Amer: 108 mL/min/{1.73_m2} (ref >59)
GFR calc non Af Amer: 94 mL/min/{1.73_m2} (ref >59)
Glucose: 179 mg/dL — ABNORMAL HIGH (ref 65–99)
Potassium: 4.5 mmol/L (ref 3.5–5.2)
Sodium: 132 mmol/L — ABNORMAL LOW (ref 134–144)

## 2020-08-12 MED ORDER — APIXABAN 5 MG PO TABS: 5.0000 mg | ORAL_TABLET | Freq: Two times a day (BID) | ORAL | 3 refills | Status: DC

## 2020-08-12 NOTE — Telephone Encounter (Signed)
    Pt's wife would like to ask Pam if Dr. Eden Emms received echo results from [facility]. She said it was done on 07/31/20

## 2020-08-12 NOTE — Telephone Encounter (Signed)
Will send message to medical records and chart prep.

## 2020-08-14 ENCOUNTER — Other Ambulatory Visit: Payer: Self-pay

## 2020-08-14 ENCOUNTER — Encounter: Payer: Self-pay | Admitting: Cardiovascular Disease

## 2020-08-14 ENCOUNTER — Other Ambulatory Visit: Payer: Self-pay | Admitting: Family Medicine

## 2020-08-14 ENCOUNTER — Ambulatory Visit (INDEPENDENT_AMBULATORY_CARE_PROVIDER_SITE_OTHER): Payer: BC Managed Care – PPO | Admitting: Cardiovascular Disease

## 2020-08-14 VITALS — BP 110/56 | HR 77 | Ht 71.0 in | Wt 170.0 lb

## 2020-08-14 DIAGNOSIS — I2510 Atherosclerotic heart disease of native coronary artery without angina pectoris: Secondary | ICD-10-CM

## 2020-08-14 DIAGNOSIS — Z01818 Encounter for other preprocedural examination: Secondary | ICD-10-CM | POA: Diagnosis not present

## 2020-08-14 DIAGNOSIS — I429 Cardiomyopathy, unspecified: Secondary | ICD-10-CM

## 2020-08-14 NOTE — Telephone Encounter (Signed)
I am happy to change this but please see how many Larry Garner patient has left (if any). Also he needs to know that we are changing and for insurance reasons before we send this in

## 2020-08-14 NOTE — Telephone Encounter (Signed)
Received echo report from [facility].

## 2020-08-14 NOTE — Telephone Encounter (Signed)
See below

## 2020-08-14 NOTE — Patient Instructions (Signed)
Medication Instructions:  Your physician recommends that you continue on your current medications as directed. Please refer to the Current Medication list given to you today.  *If you need a refill on your cardiac medications before your next appointment, please call your pharmacy*  Lab Work: If you have labs (blood work) drawn today and your tests are completely normal, you will receive your results only by: Marland Kitchen MyChart Message (if you have MyChart) OR . A paper copy in the mail If you have any lab test that is abnormal or we need to change your treatment, we will call you to review the results.   Testing/Procedures: Your physician has requested that you have a lexiscan myoview next week. For further information please visit [email or web address] Please follow instruction sheet, as given.  Your physician has requested that you have an echocardiogram in 4 weeks. Echocardiography is a painless test that uses sound waves to create images of your heart. It provides your doctor with information about the size and shape of your heart and how well your heart's chambers and valves are working. This procedure takes approximately one hour. There are no restrictions for this procedure.  Follow-Up: At [facility], you and your health needs are our priority.  As part of our continuing mission to provide you with exceptional heart care, we have created designated Provider Care Teams.  These Care Teams include your primary Cardiologist (physician) and Advanced Practice Providers (APPs -  Physician Assistants and Nurse Practitioners) who all work together to provide you with the care you need, when you need it.  We recommend signing up for the patient portal called "MyChart".  Sign up information is provided on this After Visit Summary.  MyChart is used to connect with patients for Virtual Visits (Telemedicine).  Patients are able to view lab/test results, encounter notes, upcoming appointments, etc.   Non-urgent messages can be sent to your provider as well.   To learn more about what you can do with MyChart, go to [email or web address].    Your next appointment:   8 week(s)  The format for your next appointment:   In Person  Provider:   You may see Charlton Haws, MD or one of the following Advanced Practice Providers on your designated Care Team:    Norma Fredrickson, NP  Nada Boozer, NP  Georgie Chard, NP

## 2020-08-15 ENCOUNTER — Telehealth: Payer: Self-pay | Admitting: Physical Therapy

## 2020-08-15 ENCOUNTER — Encounter: Payer: Self-pay | Admitting: Physical Therapy

## 2020-08-15 ENCOUNTER — Encounter: Payer: Self-pay | Admitting: Occupational Therapy

## 2020-08-15 ENCOUNTER — Ambulatory Visit: Payer: Medicare Other | Admitting: Physical Therapy

## 2020-08-15 ENCOUNTER — Ambulatory Visit: Payer: BC Managed Care – PPO | Attending: Family Medicine | Admitting: Occupational Therapy

## 2020-08-15 ENCOUNTER — Other Ambulatory Visit: Payer: Self-pay | Admitting: Family Medicine

## 2020-08-15 DIAGNOSIS — R4701 Aphasia: Secondary | ICD-10-CM | POA: Insufficient documentation

## 2020-08-15 DIAGNOSIS — R2681 Unsteadiness on feet: Secondary | ICD-10-CM | POA: Diagnosis present

## 2020-08-15 DIAGNOSIS — R278 Other lack of coordination: Secondary | ICD-10-CM | POA: Diagnosis present

## 2020-08-15 DIAGNOSIS — R41844 Frontal lobe and executive function deficit: Secondary | ICD-10-CM | POA: Insufficient documentation

## 2020-08-15 DIAGNOSIS — I69854 Hemiplegia and hemiparesis following other cerebrovascular disease affecting left non-dominant side: Secondary | ICD-10-CM | POA: Diagnosis present

## 2020-08-15 DIAGNOSIS — R41840 Attention and concentration deficit: Secondary | ICD-10-CM | POA: Diagnosis present

## 2020-08-15 DIAGNOSIS — R41841 Cognitive communication deficit: Secondary | ICD-10-CM | POA: Diagnosis present

## 2020-08-15 DIAGNOSIS — M6281 Muscle weakness (generalized): Secondary | ICD-10-CM

## 2020-08-15 DIAGNOSIS — R2689 Other abnormalities of gait and mobility: Secondary | ICD-10-CM | POA: Insufficient documentation

## 2020-08-15 NOTE — Therapy (Signed)
[facility] [address] [place], [place], [address] Phone: [phone]   Fax:  [phone]  Occupational Therapy Evaluation  Patient Details  Name: Larry Garner MRN: [id number] Date of Birth: [birth year] Referring Provider (OT): Bea Laura Redmond   Encounter Date: 08/15/2020   OT End of Session - 08/15/20 1011    Visit Number 1    Number of Visits 13    Date for OT Re-Evaluation 09/26/20    Authorization Type BCBS and Medicare    Authorization Time Period 10th visit progress note    Authorization - Visit Number 1    Authorization - Number of Visits 10    Progress Note Due on Visit 10    OT Start Time 0932    OT Stop Time 1012    OT Time Calculation (min) 40 min           Past Medical History:  Diagnosis Date  . Allergic rhinitis   . Allergy   . Anxiety   . Asthma   . CHF (congestive heart failure) ([place])   . Diabetes ([place])   . Diverticulitis   . Dyspnea   . GERD (gastroesophageal reflux disease)    very occ  . History of heart attack   . HLD (hyperlipidemia)   . HTN (hypertension)   . Myocardial infarction ([place]) 2000  . Sleep apnea    wears cpap   . Tubular adenoma of colon 09/2008    Past Surgical History:  Procedure Laterality Date  . CARDIAC CATHETERIZATION N/A 07/20/2016   Procedure: Left Heart Cath and Cors/Grafts Angiography;  Surgeon: Belva Crome, MD;  Location: [facility];  Service: Cardiovascular;  Laterality: N/A;  . CATARACT EXTRACTION Bilateral 06/06/2019   Dr. Tommy Rainwater. oct left, dec right 2020   . COLONOSCOPY    . CORONARY ANGIOPLASTY    . CORONARY ARTERY BYPASS GRAFT  06/1999  . POLYPECTOMY    . RENAL BIOPSY  06/2020  . TONSILLECTOMY     late50's early 60's    There were no vitals filed for this visit.   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 Pt denies any pain. Pt reports had  scheduled surgery for renal cell cancer on 08/06/20 but d/t CVA surgery was postponed. Pt currently receiving immunotherapy. Pt does not report any deficits with ADLs.    Patient is accompanied by: Family member   spouse, [name]   Limitations active renal cell cancer. fall risk. no driving    Patient Stated Goals I have trouble with dates and months and ordinal things    Currently in Pain? No/denies             [facility] OT Assessment - 08/15/20 [id number]      Assessment   Medical Diagnosis L MCA CVA    Referring Provider (OT) [place]    Onset Date/Surgical Date 07/31/20    Hand Dominance Right      Precautions   Precautions Fall      Balance Screen   Has the patient fallen in the past 6 months Yes    How many times? 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    Leisure watching [place] sports, spending time with friends      ADL   Eating/Feeding Modified independent    Grooming Modified independent    Upper Body Bathing Modified independent    Lower Body Bathing Modified independent    Upper Body Dressing Increased time    Lower Body Dressing Modified independent    Toilet Transfer Modified independent    Toileting - Actuary Supervision/safety      IADL   Shopping Needs to be accompanied on any shopping trip    Prior Level of Function Light Housekeeping did not really complete prior    Light Housekeeping Does not participate in any housekeeping tasks     Prior Level of Function Meal Prep cooked prior    Meal Prep Able to complete simple cold meal and snack prep;Does not utilize stove or oven    Prior Level of Function Community Mobility driving prior    [employer] Relies on family or friends for transportation    Prior Level of Function Medication Managment independent prior    Medication Management Is not capable of dispensing or managing own medication    Prior Level of Function Financial Management independent prior    Financial Management Dependent      Written Expression   Dominant Hand Right      Vision - History   Baseline Vision Wears glasses all the time    Additional Comments pt reports no changes      Cognition   Overall Cognitive Status Impaired/Different from baseline    Memory Impaired    Problem Solving Impaired      Observation/Other Assessments   Focus on Therapeutic Outcomes (FOTO)  59      Sensation   Light Touch Appears Intact      Coordination   9 Hole Peg Test Right;Left    Right 9 Hole Peg Test 28.69    Left 9 Hole Peg Test 37.03      ROM / Strength   AROM / PROM / Strength Strength;AROM      AROM   Overall AROM  Within functional limits for tasks performed      Strength   Overall Strength Within functional limits for tasks performed    Overall Strength Comments grossly 4+/5 bilaterally      Hand Function   Right Hand Grip (lbs) 52.6    Left Hand Grip (lbs) 38.8                           OT Education - 08/15/20 1010    Education Details Education provided on role and purpose of OT    Person(s) Educated Patient;Spouse    Methods Explanation    Comprehension Verbalized understanding            OT Short Term Goals - 08/15/20 1510      OT SHORT TERM GOAL #1   Title Pt will be independent with HEP 09/05/20    Time 3    Period Weeks    Status New  Target Date 09/05/20      OT SHORT TERM GOAL #2   Title Pt will increase grip strength in LUE to 42 lbs or  more for increase in functional use of LUE.    Baseline RUE 52.6 LUE 38.8    Time 3    Period Weeks    Status New      OT SHORT TERM GOAL #3   Title Pt will perform simple warm meal prep with supervision for return to prior level of functioning    Time 3    Period Weeks    Status New      OT SHORT TERM GOAL #4   Title Pt will increase standing tolerance to 10 minutes or greater for increasing overall endurance and activity tolerance    Time 3    Period Weeks    Status New             OT Long Term Goals - 08/15/20 1513      OT LONG TERM GOAL #1   Title Pt will be independent with updated HEP 09/26/20    Time 6    Period Weeks    Status New    Target Date 09/26/20      OT LONG TERM GOAL #2   Title Pt will increase grip strength in LUE to 48 lbs or greater for increase in overall functional use of LUE    Baseline RUE 52.6 LUE 38.8    Time 6    Period Weeks    Status New      OT LONG TERM GOAL #3   Title Pt will complete 9 hole peg test in 32 seconds or less with LUE for increase in fine motor coordination.    Baseline RUE 28.69, LUE 37.03    Time 6    Period Weeks    Status New      OT LONG TERM GOAL #4   Title Pt will complete mod complex meal prep with mod I for increase in return to prior level of functioning and home management and meal prep tasks    Time 6    Period Weeks    Status New      OT LONG TERM GOAL #5   Title Pt will tolerate 15 minutes of continuous standing activities for increase in overall standing tolerance and activity tolerance for completing IADLs.    Time 6    Period Weeks    Status New      OT LONG TERM GOAL #6   Title Pt will complete physical and cognitive task simultaneously with 90% accuracy.    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 Pt has PMH significant for Coronary Artery Disease, Heart Failure, Hyperlipidemia, Osteoarthritis, GERD, Renal Cell Cancer requiring nephrectomy (active). Pt is currently undergoing immunotherapy since previously scheduled nephrectomy (08/06/2020) has been postponed d/t CVA. Pt presents with deficits with grip strength and coordination and decrease in independence and partiicpation in daily activities. Skilled OT is recommended to target listed areas of deficit  and increase independence and decrease caregiver burden.    OT Occupational Profile and History Problem Focused Assessment - Including review of records relating to presenting problem    Occupational performance deficits (Please refer to evaluation for details): IADL's;ADL's;Leisure    Body Structure / Function / Physical Skills ADL;Decreased knowledge of use of DME;Strength;GMC;Balance;UE functional use;IADL;ROM;Endurance;FMC;Coordination    Rehab Potential Good    Clinical Decision Making Limited treatment options, no task modification necessary    Comorbidities Affecting Occupational Performance: None    Modification or Assistance to Complete Evaluation  No modification of tasks or assist necessary to complete eval    OT Frequency 2x / week    OT Duration 6 weeks   or 12 visits over extended period of time d/t scheduling   OT Treatment/Interventions Self-care/ADL training;DME and/or AE instruction;Therapeutic activities;Therapeutic exercise;Functional Mobility Training;Energy conservation;Patient/family education    Plan Coordination HEP, theraputty for LUE    Consulted and Agree with Plan of Care Patient;Family member/caregiver    Family Member Consulted spouse, [name]           Patient will benefit from skilled therapeutic intervention in order to improve the following deficits and impairments:   Body Structure / Function / Physical Skills: ADL,Decreased knowledge of use of  DME,Strength,GMC,Balance,UE functional use,IADL,ROM,Endurance,FMC,Coordination       Visit Diagnosis: 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 Acute on chronic combined systolic and diastolic CHF (congestive heart failure) ([place]) 07/17/2020  . Renal cell carcinoma ([place]) 07/17/2020  . Protein-calorie malnutrition, severe 07/08/2020  . Acute respiratory failure with hypoxia ([place]) 07/05/2020  . Iron deficiency anemia due to chronic blood loss 06/19/2020  . OSA (obstructive sleep apnea) 01/09/2018  . Pulmonary nodule, left 03/15/2017  . Former smoker 11/04/2014  . Insomnia 05/27/2014  . Plantar wart of left foot 11/27/2010  . Type 2 diabetes mellitus with other specified complication ([place]) 00/71/2197  . PSA, INCREASED 09/15/2009  . Diverticulitis of colon 05/08/2009  . Asthma, moderate persistent 06/19/2008  . ANKLE PAIN, CHRONIC 06/05/2007  . Allergic rhinitis 05/17/2007  . ERECTILE DYSFUNCTION, SECONDARY TO MEDICATION 05/17/2007  . Hyperlipidemia 04/06/2007  . Anxiety state 04/06/2007  . Essential hypertension 04/06/2007  . Coronary artery disease involving native coronary artery of native heart without angina pectoris 04/06/2007    Zachery Conch MOT, OTR/L  08/15/2020, 3:33 PM  [facility] [address] [place], [place], [address] Phone: [phone]   Fax:  [phone]  Name: Larry Garner MRN: [id number] Date of Birth: Dec 25, 1955

## 2020-08-15 NOTE — Telephone Encounter (Signed)
Faxed to provider (not in Epic).   Teresa Coombs, NP,  Larry Garner has orders for speech therapy and occupational therapy at [facility]. Based on notes that pt has had a recent fall, I believe the pt would also benefit from a referral for physical therapy.       If you agree, please place an order in [facility] in [facility] or fax the order to [phone].  Thank you, Janann August, PT, DPT 08/15/20 9:20 AM    [facility] [address] [place], [place]  [address] Phone:  [phone] Fax:  [phone]

## 2020-08-15 NOTE — Telephone Encounter (Signed)
Called and lm for pt tcb. 

## 2020-08-16 ENCOUNTER — Other Ambulatory Visit: Payer: Self-pay | Admitting: Family Medicine

## 2020-08-18 ENCOUNTER — Telehealth: Payer: Self-pay | Admitting: Cardiovascular Disease

## 2020-08-18 NOTE — Telephone Encounter (Signed)
Called patient's wife back about her questions. Informed her that she would need to discuss this with Dr. Roslyn Smiling who is doing the Immuno therapy treatment. She will call Dr. Roslyn Smiling and find out.

## 2020-08-18 NOTE — Telephone Encounter (Signed)
Patients wife is calling stating that he may be getting Immuno therapy on 08/19/2020 and has a stress test scheduled on 08/20/2020, she would like to know if it will be okay to continue his stress test or should he reschedule. Please advise.

## 2020-08-19 ENCOUNTER — Other Ambulatory Visit: Payer: Medicare Other

## 2020-08-19 ENCOUNTER — Ambulatory Visit: Payer: Medicare Other | Admitting: Cardiovascular Disease

## 2020-08-19 ENCOUNTER — Other Ambulatory Visit: Payer: Self-pay | Admitting: Family Medicine

## 2020-08-19 ENCOUNTER — Ambulatory Visit: Payer: BC Managed Care – PPO | Admitting: Thoracic Surgery (Cardiothoracic Vascular Surgery)

## 2020-08-20 ENCOUNTER — Other Ambulatory Visit: Payer: Self-pay

## 2020-08-20 ENCOUNTER — Ambulatory Visit (HOSPITAL_COMMUNITY): Payer: BC Managed Care – PPO | Attending: Internal Medicine

## 2020-08-20 ENCOUNTER — Other Ambulatory Visit: Payer: BC Managed Care – PPO

## 2020-08-20 DIAGNOSIS — I2510 Atherosclerotic heart disease of native coronary artery without angina pectoris: Secondary | ICD-10-CM | POA: Diagnosis present

## 2020-08-20 DIAGNOSIS — I5020 Unspecified systolic (congestive) heart failure: Secondary | ICD-10-CM

## 2020-08-20 DIAGNOSIS — I429 Cardiomyopathy, unspecified: Secondary | ICD-10-CM | POA: Diagnosis present

## 2020-08-20 DIAGNOSIS — Z01818 Encounter for other preprocedural examination: Secondary | ICD-10-CM | POA: Diagnosis present

## 2020-08-20 DIAGNOSIS — I10 Essential (primary) hypertension: Secondary | ICD-10-CM

## 2020-08-20 LAB — MYOCARDIAL PERFUSION IMAGING
LV dias vol: 203 mL (ref 62–150)
LV sys vol: 147 mL
Peak HR: 78 {beats}/min
Rest HR: 73 {beats}/min
SDS: 3
SRS: 11
SSS: 14
TID: 1.04

## 2020-08-20 LAB — BASIC METABOLIC PANEL
BUN/Creatinine Ratio: 25 — ABNORMAL HIGH (ref 10–24)
BUN: 21 mg/dL (ref 8–27)
CO2: 17 mmol/L — ABNORMAL LOW (ref 20–29)
Calcium: 9 mg/dL (ref 8.6–10.2)
Chloride: 98 mmol/L (ref 96–106)
Creatinine, Ser: 0.84 mg/dL (ref 0.76–1.27)
GFR calc Af Amer: 106 mL/min/{1.73_m2} (ref >59)
GFR calc non Af Amer: 92 mL/min/{1.73_m2} (ref >59)
Glucose: 273 mg/dL — ABNORMAL HIGH (ref 65–99)
Potassium: 4.5 mmol/L (ref 3.5–5.2)
Sodium: 130 mmol/L — ABNORMAL LOW (ref 134–144)

## 2020-08-20 MED ORDER — TECHNETIUM TC 99M TETROFOSMIN IV KIT
10.6000 | PACK | Freq: Once | INTRAVENOUS | Status: AC | PRN
  Administered 2020-08-20: 10.6 via INTRAVENOUS
  Filled 2020-08-20: qty 11

## 2020-08-20 MED ORDER — REGADENOSON 0.4 MG/5ML IV SOLN
0.4000 mg | Freq: Once | INTRAVENOUS | Status: AC
  Administered 2020-08-20: 0.4 mg via INTRAVENOUS

## 2020-08-20 MED ORDER — TECHNETIUM TC 99M TETROFOSMIN IV KIT
31.4000 | PACK | Freq: Once | INTRAVENOUS | Status: AC | PRN
  Administered 2020-08-20: 31.4 via INTRAVENOUS
  Filled 2020-08-20: qty 32

## 2020-08-21 ENCOUNTER — Telehealth: Payer: Self-pay

## 2020-08-21 ENCOUNTER — Other Ambulatory Visit: Payer: Self-pay

## 2020-08-21 NOTE — Telephone Encounter (Signed)
Please review below

## 2020-08-21 NOTE — Telephone Encounter (Signed)
Received call from [facility] regarding pts prescription for Invokament tablets. Jasmine from [facility] states insurance is not covering this prescription. They are requesting to receive a new prescription that will be covered by insurance. They recommended Metformin HCL tablets. Please call back to update them with new prescription.   Ref # [id number]  Call Back # [phone]  This prescription will expire tomorrow if not filled.

## 2020-08-21 NOTE — Telephone Encounter (Signed)
Dr.Hunter may you please review this for me ? Is there another medication that he can use or another way for [place] to get him this medication cheaper.

## 2020-08-21 NOTE — Telephone Encounter (Signed)
Larry Garner is very expensive. I am not sure of ways to make it cheaper. I will message our pharmacist jacob as well as patient's cardiologist Dr. Johnsie Cancel to see if they have any ideas.    Team please let them know we are looking into options. I looked at goodrx but didn't help much.

## 2020-08-21 NOTE — Telephone Encounter (Signed)
Unable to get in contact with the patient. LVM asking him to return our call at the office. Office number was provided.

## 2020-08-21 NOTE — Telephone Encounter (Signed)
Patient's wife called in asking for advice on what to do for her husbands prescription, states when in the hospital one of the doctors prescribed Intreso 24-26 MG. [facility] was going to [facility] $600, [facility] only has one pill left and is in desperate need of it. Wanted to know if Dr.Hunter would know a way to get this cheaper.

## 2020-08-21 NOTE — Telephone Encounter (Signed)
Called patient's wife about Entresto. Gave patient's wife information for $10 co-pay. Patient had two more doses before he runs out. Informed patient's wife to call our office if co-pay card does not work, so we can work on patient assistance program or change medication.

## 2020-08-21 NOTE — Telephone Encounter (Signed)
?   Are there samples or patient assistance If he is going to run out or not take would call in cozaar 25 mg daily in interim

## 2020-08-21 NOTE — Telephone Encounter (Signed)
Please stop invokamet on his prescriptions.  Please send in metformin 1000 mg twice daily #180 with 3 refills  Please inform patient of the above.  If you would like we could also try Jardiance 10 mg daily #90 with 3 refills-possible this will be covered

## 2020-08-22 ENCOUNTER — Telehealth: Payer: Self-pay

## 2020-08-22 MED ORDER — METFORMIN HCL 1000 MG PO TABS: 1000.0000 mg | ORAL_TABLET | Freq: Two times a day (BID) | ORAL | 0 refills | Status: DC

## 2020-08-22 MED ORDER — EMPAGLIFLOZIN 10 MG PO TABS: 10.0000 mg | ORAL_TABLET | Freq: Every day | ORAL | 0 refills | Status: DC

## 2020-08-22 NOTE — Telephone Encounter (Signed)
Pt wife called back asking what pt should do since he doesn't have his diabetic meds right now. Please advise.

## 2020-08-22 NOTE — Addendum Note (Signed)
Addended by: Zachery Conch on: 08/22/2020 01:13 PM   Modules accepted: Orders

## 2020-08-22 NOTE — Telephone Encounter (Signed)
They will call back on Monday or Tuesday if sugars still over 200- may make further adjustments

## 2020-08-22 NOTE — Telephone Encounter (Signed)
Please advise 

## 2020-08-22 NOTE — Telephone Encounter (Signed)
Patient still has ozempic in his system and hoping to get another dosage available by tomorrow or Sunday and due for injection Sunday  Has glimepiride 2 mg available. Has not taken any recently. Has multiple available. #15 . Take two each morning until jardiance arrives.   Metformin 1000mg  twice daily [facility]   Sugars have been up to 200s off of invokamet. Wife thinks sugars were much better when on this. Did have 292 before immunotherapy but had just had OJ- he has cut back on high sugar content.

## 2020-08-22 NOTE — Telephone Encounter (Signed)
Pam- can you relay info to wife

## 2020-08-22 NOTE — Telephone Encounter (Signed)
I spoke with pt wife. She was able to get copay card. Got his Entresto for $10. Wife appreciated the call

## 2020-08-22 NOTE — Telephone Encounter (Signed)
Patient and his wife is aware of the new medication that he'll be taking per Dr. Yong Channel and their both aware that it'll come in the mail. Patient has an up to date PA on file with his insurance for his Jardiance. PA # is [id number] it is valid 07/23/2020-08/22/2021. His Metformin (regular not XR) did not need a PA.  I did NOT send in an electronic script I verbally gave the script over [facility] phone to [facility].  The patient has a year supply on both of his medications.

## 2020-08-25 ENCOUNTER — Ambulatory Visit: Payer: BC Managed Care – PPO | Admitting: Occupational Therapy

## 2020-08-26 ENCOUNTER — Encounter: Payer: Self-pay | Admitting: Family Medicine

## 2020-08-27 ENCOUNTER — Ambulatory Visit: Payer: BC Managed Care – PPO | Admitting: Occupational Therapy

## 2020-08-27 ENCOUNTER — Other Ambulatory Visit: Payer: Self-pay

## 2020-08-27 ENCOUNTER — Encounter: Payer: Self-pay | Admitting: Physical Therapy

## 2020-08-27 ENCOUNTER — Ambulatory Visit: Payer: BC Managed Care – PPO | Admitting: Physical Therapy

## 2020-08-27 DIAGNOSIS — I69854 Hemiplegia and hemiparesis following other cerebrovascular disease affecting left non-dominant side: Secondary | ICD-10-CM | POA: Diagnosis not present

## 2020-08-27 DIAGNOSIS — R278 Other lack of coordination: Secondary | ICD-10-CM

## 2020-08-27 DIAGNOSIS — M6281 Muscle weakness (generalized): Secondary | ICD-10-CM

## 2020-08-27 DIAGNOSIS — R2681 Unsteadiness on feet: Secondary | ICD-10-CM

## 2020-08-27 DIAGNOSIS — R2689 Other abnormalities of gait and mobility: Secondary | ICD-10-CM

## 2020-08-27 MED ORDER — ZALEPLON 10 MG PO CAPS: 10.0000 mg | ORAL_CAPSULE | Freq: Every evening | ORAL | 5 refills | Status: DC | PRN

## 2020-08-27 NOTE — Therapy (Signed)
[facility] [address] [place], [place], [address] Phone: [phone]   Fax:  [phone]  Physical Therapy Evaluation  Patient Details  Name: Larry Garner MRN: [id number] Date of Birth: [birth year] Referring Provider (PT): Redmond, Bea Laura, NP   Encounter Date: 08/27/2020   PT End of Session - 08/27/20 1506    Visit Number 1    Number of Visits 17    Date for PT Re-Evaluation 11/25/20   written for 60 day POC   Authorization Type BCBS, Medicare    PT Start Time 1236    PT Stop Time 1317    PT Time Calculation (min) 41 min    Equipment Utilized During Treatment Gait belt    Activity Tolerance Patient tolerated treatment well    Behavior During Therapy [facility] for tasks assessed/performed           Past Medical History:  Diagnosis Date  . Allergic rhinitis   . Allergy   . Anxiety   . Asthma   . CHF (congestive heart failure) ([place])   . Diabetes ([place])   . Diverticulitis   . Dyspnea   . GERD (gastroesophageal reflux disease)    very occ  . History of heart attack   . HLD (hyperlipidemia)   . HTN (hypertension)   . Myocardial infarction ([place]) 2000  . Sleep apnea    wears cpap   . Tubular adenoma of colon 09/2008    Past Surgical History:  Procedure Laterality Date  . CARDIAC CATHETERIZATION N/A 07/20/2016   Procedure: Left Heart Cath and Cors/Grafts Angiography;  Surgeon: Belva Crome, MD;  Location: [facility];  Service: Cardiovascular;  Laterality: N/A;  . CATARACT EXTRACTION Bilateral 06/06/2019   Dr. Tommy Rainwater. oct left, dec right 2020   . COLONOSCOPY    . CORONARY ANGIOPLASTY    . CORONARY ARTERY BYPASS GRAFT  06/1999  . POLYPECTOMY    . RENAL BIOPSY  06/2020  . TONSILLECTOMY     late50's early 60's    There were no vitals filed for this visit.    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 Reports he has regained a substantial part of his  memory. Biggest problem is recalling numbers and dates. Reports his knees feel weak and feels like that they would give out.Limits himself going up and down one time a day and needs assistance.  Didn't get inpatient rehab or home health therapy. No falls since he has been home from the hospital, but does have a hx of 3 falls.    Patient is accompained by: Family member   beth   Pertinent History L MCA CVA, Coronary Artery Disease, Heart Failure, Hyperlipidemia, Osteoarthritis, GERD, Renal Cell Cancer requiring nephrectomy (active)    Limitations Standing;Walking;Other (comment)   stairs   How long can you walk comfortably? approx. 100' with RW    Patient Stated Goals go up and down the stairs by himself, find out what his new normal will be, walk without the RW    Currently in Pain? No/denies              [facility] PT Assessment - 08/27/20 1246      Assessment   Medical Diagnosis L MCA CVA    Referring Provider (PT) Redmond, Bea Laura, NP    Onset Date/Surgical Date 07/31/20    Hand Dominance Right    Prior Therapy no prior therapy  Precautions   Precautions Fall      Balance Screen   Has the patient fallen in the past 6 months Yes    How many times? 3   one fall outside   Has the patient had a decrease in activity level because of a fear of falling?  Yes    Is the patient reluctant to leave their home because of a fear of falling?  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    Leisure watching [place] sports, spending time with friends, loves to sing      Cognition   Memory Impaired    Problem Solving Impaired      Sensation   Light Touch Appears Intact      Coordination   Gross Motor Movements are Fluid and Coordinated Yes      ROM / Strength   AROM / PROM / Strength Strength;AROM      AROM   Overall AROM Comments limited L knee extension, limited R ankle DF compared to L      Strength   Strength Assessment Site Knee;Ankle;Hip    Right/Left Hip Right;Left    Right Hip Flexion 3/5    Left Hip Flexion 4-/5    Right/Left Knee Right;Left    Right Knee Flexion 4+/5    Right Knee Extension 5/5    Left Knee Flexion 4+/5    Left Knee Extension 5/5    Right/Left Ankle Right;Left    Right Ankle Dorsiflexion 5/5    Left Ankle Dorsiflexion 3+/5      Transfers   Transfers Sit to Stand;Stand to Sit    Sit to Stand 5: Supervision;With upper extremity assist;From chair/3-in-1    Five time sit to stand comments  25 seconds with single UE on chair, 4/10 RPE afterwards    Stand to Sit 5: Supervision;With upper extremity assist;To chair/3-in-1    Transfer Cueing pt needing cues for proper UE placement with sit <> stands - tendency to have BUE on RW      Ambulation/Gait   Ambulation/Gait Yes    Ambulation/Gait Assistance 5: Supervision;4: Min guard    Ambulation/Gait Assistance Details supervision with RW, min guard when ambulating with no AD - with no AD noted decr R ankle DF    Ambulation Distance (Feet) --   no AD x40', RW approx 100'   Assistive device Rolling walker;None    Gait Pattern Step-through pattern;Decreased stance time - left;Decreased dorsiflexion - right;Decreased weight shift to right   R foot scuffing intermittently   Ambulation Surface Level;Indoor    Gait velocity 16.35 seconds = 2.00 ft/sec    Stairs Yes    Stairs Assistance Other (comment)   CGA   Stair Management Technique Forwards;Two rails;Alternating  pattern   pt reports performing stairs with step to pattern at home  Number of Stairs 4    Height of Stairs 6    Gait Comments provided pt with tennis balls to RW for easier propulsion, pt reports not using an AD at home      Standardized Balance Assessment   Standardized Balance Assessment Timed Up and Go Test      Timed Up and Go Test   TUG Normal TUG    Normal TUG (seconds) 25.29   with RW   TUG Comments 20.63 with no AD                      Objective measurements completed on examination: See above findings.               PT Education - 08/27/20 1506    Education Details clinical findings, POC    Person(s) Educated Patient;Spouse    Methods Explanation    Comprehension Verbalized understanding            PT Short Term Goals - 08/27/20 1513      PT SHORT TERM GOAL #1   Title Pt will be independent with initial HEP and verbalize understanding of decr fall risk in the home. ALL STGS DUE 09/24/20    Time 4    Period Weeks    Status New    Target Date 09/24/20      PT SHORT TERM GOAL #2   Title Pt will undergo further assessment of BERG/DGI with STG/LTG written as appropriate.    Time 4    Period Weeks    Status New      PT SHORT TERM GOAL #3   Title Pt will perform 5x sit <> stand in 20 seconds or less with single UE support in order to demo incr balance.    Baseline 25 seconds    Time 4    Period Weeks    Status New      PT SHORT TERM GOAL #4   Title Pt will improve gait speed with RW vs. LRAD to at least 2.4 ft/sec in order to demo improved community mobility.    Baseline 2.00 ft/sec    Time 4    Period Weeks    Status New      PT SHORT TERM GOAL #5   Title Pt will perform TUG with no AD in 17 seconds or less in order to demo decr fall risk.    Baseline 20.63 seconds    Time 4    Period Weeks    Status New             PT Long Term Goals - 08/27/20 1516      PT LONG TERM GOAL #1   Title Pt will be independent with final  HEP in order to build upon functional gains made in therapy. ALL LTGS DUE 10/22/20    Time 8    Period Weeks    Status New    Target Date 10/22/20      PT LONG TERM GOAL #2   Title BERG/DGI goal to be written as appropriate.    Time 8    Period Weeks    Status New      PT LONG TERM GOAL #3   Title Pt will improve gait speed with LRAD vs. no AD to at least 2.7 ft/sec in order to demo improved community mobility.    Baseline 2.00 ft/sec    Time 8    Period Weeks  Status New      PT LONG TERM GOAL #4   Title Pt will perform TUG with no AD in 13.5 seconds or less in order to demo decr fall risk.    Baseline 20.63 seconds    Time 8    Period Weeks    Status New      PT LONG TERM GOAL #5   Title Pt will ambulate at least 500' with supervision over indoor/outdoor surfaces with LRAD in order to demo improved community mobility.    Time 8    Period Weeks    Status New      Additional Long Term Goals   Additional Long Term Goals Yes      PT LONG TERM GOAL #6   Title Pt will ambulate 12 steps with single handrail and step to vs. step through pattern with supervision in order to safely go up/down the stairs in his house.    Baseline 4 steps B handrails step through with CGA    Time 8    Period Weeks    Status New                  Plan - 08/27/20 1509    Clinical Impression Statement Patient is a 65 year old male referred to Neuro OPPT for L MCA CVA (on 07/31/20). Pt was discharged home and did not receivve therapy yet for his CVA. Pt's PMH is significant for: Coronary Artery Disease, Heart Failure, Hyperlipidemia, Osteoarthritis, GERD, Renal Cell Cancer requiring nephrectomy (active). Pt is currently undergoing immunotherapy since previously scheduled nephrectomy (08/06/2020) has been postponed d/t CVA. The following deficits were present during the exam:  decr strength, impaired balance, decr endurance, gait abnormalities, decr safety awareness. Pt has been using the RW  primarily for gait but sometimes with no AD with pt demonstrating decr R ankle DF/foot scuffing at times. Based on 5x sit <> stand and TUG (with RW and no AD) pt is at a high risk for falls. Pt's gait speed indicates a limited community ambulator.  Pt would benefit from skilled PT to address these impairments and functional limitations to maximize functional mobility independence    Personal Factors and Comorbidities Comorbidity 3+;Past/Current Experience    Comorbidities L MCA CVA, Coronary Artery Disease, Heart Failure, Hyperlipidemia, Osteoarthritis, GERD, Renal Cell Cancer requiring nephrectomy (active)    Examination-Activity Limitations Locomotion Level;Stand;Transfers;Stairs    Examination-Participation Restrictions Community Activity;Occupation   work (is a Psychologist, occupationalbanker)   Conservation officer, historic buildingstability/Clinical Decision Making Stable/Uncomplicated    Clinical Decision Making Low    Rehab Potential Good    PT Frequency 2x / week    PT Duration 8 weeks    PT Treatment/Interventions ADLs/Self Care Home Management;Stair training;Gait training;DME Instruction;Functional mobility training;Therapeutic activities;Therapeutic exercise;Balance training;Neuromuscular re-education;Patient/family education;Orthotic Fit/Training;Passive range of motion;Vestibular;Energy conservation    PT Next Visit Plan perform BERG vs. DGI, initial HEP for standing balance and strengthening. 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 Acute on chronic combined systolic and diastolic CHF (congestive heart failure) ([place]) 07/17/2020  . Renal cell carcinoma ([place]) 07/17/2020  . Protein-calorie malnutrition, severe 07/08/2020  . Acute respiratory failure with hypoxia ([place]) 07/05/2020  . Iron deficiency anemia due to chronic blood loss 06/19/2020  . OSA (obstructive sleep apnea) 01/09/2018  . Pulmonary nodule, left 03/15/2017  . Former smoker 11/04/2014  . Insomnia 05/27/2014  . Plantar wart of left foot 11/27/2010  . Type 2 diabetes mellitus with other specified complication ([place]) 78/46/9629  . PSA, INCREASED 09/15/2009  . Diverticulitis of colon 05/08/2009  . Asthma, moderate persistent 06/19/2008  . ANKLE PAIN, CHRONIC 06/05/2007  . Allergic rhinitis 05/17/2007  . ERECTILE DYSFUNCTION, SECONDARY TO MEDICATION 05/17/2007  . Hyperlipidemia 04/06/2007  . Anxiety state 04/06/2007  . Essential hypertension 04/06/2007  . Coronary artery disease involving native coronary artery of native heart without angina pectoris 04/06/2007    Arliss Journey, PT, DPT  08/27/2020, 3:26 PM  [facility] [address] [place], [place], [address] Phone: [phone]   Fax:  [phone]  Name: SERGI GELLNER MRN: [id number] Date of Birth: 24-Dec-1955

## 2020-08-28 ENCOUNTER — Telehealth: Payer: Self-pay

## 2020-08-28 NOTE — Telephone Encounter (Signed)
Left voicemail to reduce glimepiride to 2 mg if taking 4 mg each morning and to reduce to 0 and taking 2 mg each morning.  I believe he is currently on Ozempic and metformin.  I am not sure if he has received Jardiance yet-but team when you call please give me an update

## 2020-08-28 NOTE — Telephone Encounter (Signed)
Patient's wife is calling in because they took Dashiel's blood sugar and if it were to go below 80 they were told to notify Dr.Hunter. Blood sugar was 79.

## 2020-08-28 NOTE — Telephone Encounter (Signed)
Pt called following up on previous message. Pt is asking for Dr. Ansel Bong guidance on blood sugar. Please advise.

## 2020-08-28 NOTE — Telephone Encounter (Signed)
See below

## 2020-09-01 ENCOUNTER — Encounter: Payer: Self-pay | Admitting: Family Medicine

## 2020-09-02 ENCOUNTER — Encounter: Payer: Self-pay | Admitting: Physical Therapy

## 2020-09-02 ENCOUNTER — Ambulatory Visit: Payer: BC Managed Care – PPO | Admitting: Occupational Therapy

## 2020-09-02 ENCOUNTER — Ambulatory Visit: Payer: BC Managed Care – PPO | Admitting: Physical Therapy

## 2020-09-02 ENCOUNTER — Other Ambulatory Visit: Payer: Self-pay

## 2020-09-02 DIAGNOSIS — R41840 Attention and concentration deficit: Secondary | ICD-10-CM

## 2020-09-02 DIAGNOSIS — I69854 Hemiplegia and hemiparesis following other cerebrovascular disease affecting left non-dominant side: Secondary | ICD-10-CM | POA: Diagnosis not present

## 2020-09-02 DIAGNOSIS — R41844 Frontal lobe and executive function deficit: Secondary | ICD-10-CM

## 2020-09-02 DIAGNOSIS — R2681 Unsteadiness on feet: Secondary | ICD-10-CM

## 2020-09-02 DIAGNOSIS — M6281 Muscle weakness (generalized): Secondary | ICD-10-CM

## 2020-09-02 DIAGNOSIS — R2689 Other abnormalities of gait and mobility: Secondary | ICD-10-CM

## 2020-09-02 DIAGNOSIS — R278 Other lack of coordination: Secondary | ICD-10-CM

## 2020-09-02 NOTE — Patient Instructions (Signed)
  Coordination Activities  Perform the following activities for 20 minutes 2 times per day with left hand(s).   Rotate ball in fingertips (clockwise and counter-clockwise).  Toss ball between hands.  Toss ball in air and catch with the same hand.  Flip cards 1 at a time as fast as you can.  Deal cards with your thumb (Hold deck in hand and push card off top with thumb).  Rotate card in hand (clockwise and counter-clockwise).  Pick up coins, buttons, marbles, dried beans/pasta of different sizes and place in container.  Pick up coins and place in container or coin bank.  Pick up coins and stack.  Pick up coins one at a time until you get 5-10 in your hand, then move coins from palm to fingertips to stack one at a time.  Twirl pen between fingers.  Practice writing and/or typing.  Screw together nuts and bolts, then unfasten.

## 2020-09-02 NOTE — Therapy (Signed)
[facility] [address] [place], [place], [address] Phone: [phone]   Fax:  [phone]  Occupational Therapy Treatment  Patient Details  Name: Larry Garner MRN: [id number] Date of Birth: [birth year] Referring Provider (OT): Bea Laura Redmond   Encounter Date: 09/02/2020   OT End of Session - 09/02/20 1614    Visit Number 2    Number of Visits 13    Date for OT Re-Evaluation 09/26/20    Authorization Type BCBS and Medicare    Authorization Time Period 10th visit progress note    Authorization - Visit Number 2    Authorization - Number of Visits 10    Progress Note Due on Visit 10    OT Start Time 1614    OT Stop Time 1658    OT Time Calculation (min) 44 min    Activity Tolerance Patient tolerated treatment well    Behavior During Therapy [facility] for tasks assessed/performed           Past Medical History:  Diagnosis Date  . Allergic rhinitis   . Allergy   . Anxiety   . Asthma   . CHF (congestive heart failure) ([place])   . Diabetes ([place])   . Diverticulitis   . Dyspnea   . GERD (gastroesophageal reflux disease)    very occ  . History of heart attack   . HLD (hyperlipidemia)   . HTN (hypertension)   . Myocardial infarction ([place]) 2000  . Sleep apnea    wears cpap   . Tubular adenoma of colon 09/2008    Past Surgical History:  Procedure Laterality Date  . CARDIAC CATHETERIZATION N/A 07/20/2016   Procedure: Left Heart Cath and Cors/Grafts Angiography;  Surgeon: Belva Crome, MD;  Location: [facility];  Service: Cardiovascular;  Laterality: N/A;  . CATARACT EXTRACTION Bilateral 06/06/2019   Dr. Tommy Rainwater. oct left, dec right 2020   . COLONOSCOPY    . CORONARY ANGIOPLASTY    . CORONARY ARTERY BYPASS GRAFT  06/1999  . POLYPECTOMY    . RENAL BIOPSY  06/2020  . TONSILLECTOMY     late50's early 60's    There were no vitals filed for this visit.   Subjective Assessment - 09/02/20 1646     Subjective  Pt denies any pain or changes. Says things are getting better.    Patient is accompanied by: Family member   spouse, [name]   Limitations active renal cell cancer. fall risk. no driving    Patient Stated Goals I have trouble with dates and months and ordinal things    Currently in Pain? No/denies                        OT Treatments/Exercises (OP) - 09/02/20 1636      ADLs   Cooking pt and pt spouse report pt is doing more cooking and cleaning at home with assistance.      Exercises   Exercises Hand;Work Hardening      Hand Exercises   Other Hand Exercises Hand Gripper Level 2 with LUE and picking up 1 inch blocks      Work Hardening Exercises   UBE (Upper Arm Bike) Level 1.5 for 6 minutes for overall conditioning      Fine Motor Coordination (Hand/Wrist)   Fine Motor Coordination Grooved pegs    Grooved pegs LUE with placing one at a time and removing one at a time with min difficulty  and no drops                  OT Education - 09/02/20 1623    Education Details Coordination HEP    Person(s) Educated Patient;Spouse    Methods Explanation;Demonstration;Handout    Comprehension Verbalized understanding;Returned demonstration            OT Short Term Goals - 09/02/20 1637      OT SHORT TERM GOAL #1   Title Pt will be independent with HEP 09/05/20    Time 3    Period Weeks    Status On-going    Target Date 09/05/20      OT SHORT TERM GOAL #2   Title Pt will increase grip strength in LUE to 42 lbs or more for increase in functional use of LUE.    Baseline RUE 52.6 LUE 38.8    Time 3    Period Weeks    Status New      OT SHORT TERM GOAL #3   Title Pt will perform simple warm meal prep with supervision for return to prior level of functioning    Time 3    Period Weeks    Status On-going      OT SHORT TERM GOAL #4   Title Pt will increase standing tolerance to 10 minutes or greater for increasing overall endurance and activity  tolerance    Time 3    Period Weeks    Status On-going             OT Long Term Goals - 08/15/20 1513      OT LONG TERM GOAL #1   Title Pt will be independent with updated HEP 09/26/20    Time 6    Period Weeks    Status New    Target Date 09/26/20      OT LONG TERM GOAL #2   Title Pt will increase grip strength in LUE to 48 lbs or greater for increase in overall functional use of LUE    Baseline RUE 52.6 LUE 38.8    Time 6    Period Weeks    Status New      OT LONG TERM GOAL #3   Title Pt will complete 9 hole peg test in 32 seconds or less with LUE for increase in fine motor coordination.    Baseline RUE 28.69, LUE 37.03    Time 6    Period Weeks    Status New      OT LONG TERM GOAL #4   Title Pt will complete mod complex meal prep with mod I for increase in return to prior level of functioning and home management and meal prep tasks    Time 6    Period Weeks    Status New      OT LONG TERM GOAL #5   Title Pt will tolerate 15 minutes of continuous standing activities for increase in overall standing tolerance and activity tolerance for completing IADLs.    Time 6    Period Weeks    Status New      OT LONG TERM GOAL #6   Title Pt will complete physical and cognitive task simultaneously with 90% accuracy.    Time 6    Period Weeks    Status New      OT LONG TERM GOAL #7   Title Complete FOTO at discharge    Time 6    Period Weeks    Status  New                 Plan - 09/02/20 1657    Clinical Impression Statement Pt back after initial evaluation. Reports overall improvement. Pt with continued deconditionining throughout session.    OT Occupational Profile and History Problem Focused Assessment - Including review of records relating to presenting problem    Occupational performance deficits (Please refer to evaluation for details): IADL's;ADL's;Leisure    Body Structure / Function / Physical Skills ADL;Decreased knowledge of use of  DME;Strength;GMC;Balance;UE functional use;IADL;ROM;Endurance;FMC;Coordination    Rehab Potential Good    Clinical Decision Making Limited treatment options, no task modification necessary    Comorbidities Affecting Occupational Performance: None    Modification or Assistance to Complete Evaluation  No modification of tasks or assist necessary to complete eval    OT Frequency 2x / week    OT Duration 6 weeks   or 12 visits over extended period of time d/t scheduling   OT Treatment/Interventions Self-care/ADL training;DME and/or AE instruction;Therapeutic activities;Therapeutic exercise;Functional Mobility Training;Energy conservation;Patient/family education    Plan theraputty for LUE?, Conditioning/activity tolerance, cooking    Consulted and Agree with Plan of Care Patient;Family member/caregiver    Family Member Consulted spouse, [name]           Patient will benefit from skilled therapeutic intervention in order to improve the following deficits and impairments:   Body Structure / Function / Physical Skills: ADL,Decreased knowledge of use of DME,Strength,GMC,Balance,UE functional use,IADL,ROM,Endurance,FMC,Coordination       Visit Diagnosis: 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 Acute on chronic combined systolic and diastolic CHF (congestive heart failure) ([place]) 07/17/2020  . Renal cell carcinoma ([place]) 07/17/2020  . Protein-calorie malnutrition, severe 07/08/2020  . Acute respiratory failure with hypoxia ([place]) 07/05/2020  . Iron deficiency anemia due to chronic blood loss 06/19/2020  . OSA (obstructive sleep apnea) 01/09/2018  .  Pulmonary nodule, left 03/15/2017  . Former smoker 11/04/2014  . Insomnia 05/27/2014  . Plantar wart of left foot 11/27/2010  . Type 2 diabetes mellitus with other specified complication ([place]) [id number]  . PSA, INCREASED 09/15/2009  . Diverticulitis of colon 05/08/2009  . Asthma, moderate persistent 06/19/2008  . ANKLE PAIN, CHRONIC 06/05/2007  . Allergic rhinitis 05/17/2007  . ERECTILE DYSFUNCTION, SECONDARY TO MEDICATION 05/17/2007  . Hyperlipidemia 04/06/2007  . Anxiety state 04/06/2007  . Essential hypertension 04/06/2007  . Coronary artery disease involving native coronary artery of native heart without angina pectoris 04/06/2007    Zachery Conch MOT, OTR/L  09/02/2020, 5:00 PM  [facility] [address] [place], [place], [address] Phone: [phone]   Fax:  [phone]  Name: Larry Garner MRN: [id number] Date of Birth: 23-Mar-1956

## 2020-09-02 NOTE — Therapy (Signed)
[facility] [address] [place], [place], [address] Phone: [phone]   Fax:  [phone]  Physical Therapy Treatment  Patient Details  Name: DORAL WENHOLD MRN: [id number] Date of Birth: 02/05/1956 Referring Provider (PT): Redmond, Bea Laura, NP   Encounter Date: 09/02/2020   PT End of Session - 09/02/20 1740    Visit Number 2    Number of Visits 17    Date for PT Re-Evaluation 11/25/20   written for 60 day POC   Authorization Type BCBS, Medicare    PT Start Time 1656    PT Stop Time 1736    PT Time Calculation (min) 40 min    Equipment Utilized During Treatment Gait belt    Activity Tolerance Patient tolerated treatment well    Behavior During Therapy [facility] for tasks assessed/performed           Past Medical History:  Diagnosis Date  . Allergic rhinitis   . Allergy   . Anxiety   . Asthma   . CHF (congestive heart failure) ([place])   . Diabetes ([place])   . Diverticulitis   . Dyspnea   . GERD (gastroesophageal reflux disease)    very occ  . History of heart attack   . HLD (hyperlipidemia)   . HTN (hypertension)   . Myocardial infarction ([place]) 2000  . Sleep apnea    wears cpap   . Tubular adenoma of colon 09/2008    Past Surgical History:  Procedure Laterality Date  . CARDIAC CATHETERIZATION N/A 07/20/2016   Procedure: Left Heart Cath and Cors/Grafts Angiography;  Surgeon: Belva Crome, MD;  Location: [facility];  Service: Cardiovascular;  Laterality: N/A;  . CATARACT EXTRACTION Bilateral 06/06/2019   Dr. Tommy Rainwater. oct left, dec right 2020   . COLONOSCOPY    . CORONARY ANGIOPLASTY    . CORONARY ARTERY BYPASS GRAFT  06/1999  . POLYPECTOMY    . RENAL BIOPSY  06/2020  . TONSILLECTOMY     late50's early 60's    There were no vitals filed for this visit.   Subjective Assessment - 09/02/20 1657    Subjective No changes since he was last here.    Patient is accompained by: Family member   beth    Pertinent History L MCA CVA, Coronary Artery Disease, Heart Failure, Hyperlipidemia, Osteoarthritis, GERD, Renal Cell Cancer requiring nephrectomy (active)    Limitations Standing;Walking;Other (comment)   stairs   How long can you walk comfortably? approx. 100' with RW    Patient Stated Goals go up and down the stairs by himself, find out what his new normal will be, walk without the RW    Currently in Pain? No/denies              [facility] PT Assessment - 09/02/20 1657      Standardized Balance Assessment   Standardized Balance Assessment Berg Balance Test      Berg Balance Test   Sit to Stand Able to stand  independently using hands    Standing Unsupported Able to stand safely 2 minutes    Sitting with Back Unsupported but Feet Supported on Floor or Stool Able to sit safely and securely 2 minutes    Stand to Sit Controls descent by using hands    Transfers Able to transfer safely, definite need of hands    Standing Unsupported with Eyes Closed Able to stand 10 seconds safely    Standing Unsupported with Feet Together Able  to place feet together independently and stand 1 minute safely    From Standing, Reach Forward with Outstretched Arm Can reach confidently >25 cm (10")    From Standing Position, Pick up Object from [place] to pick up shoe safely and easily    From Standing Position, Turn to Look Behind Over each Shoulder Looks behind from both sides and weight shifts well    Turn 360 Degrees Able to turn 360 degrees safely but slowly   6.57 to L, 7.17 to R   Standing Unsupported, Alternately Place Feet on Step/Stool Able to complete >2 steps/needs minimal assist    Standing Unsupported, One Foot in Front Able to plae foot ahead of the other independently and hold 30 seconds    Standing on One Leg Tries to lift leg/unable to hold 3 seconds but remains standing independently    Total Score 44    Berg comment: 44/56                    Access Code: [email or web address] URL:  [email or web address] Date: 09/02/2020 Prepared by: Janann August  Initiated HEP. See MedBridge for more details.   Exercises Sit to Stand with Armchair - 2 x daily - 5 x weekly - 2 sets - 5 reps Alternating Step Taps with Counter Support - 2 x daily - 5 x weekly - 1 sets - 10 reps Side Stepping with Counter Support - 2 x daily - 5 x weekly - 3 sets Standing Tandem Balance with Unilateral Counter Support - 2 x daily - 5 x weekly - 3 sets - 10-15 hold - performing in the corner  Staggered Stance Forward Backward Weight Shift with Counter Support - 2 x daily - 5 x weekly - 1-2 sets - 10 reps               PT Education - 09/02/20 1740    Education Details results of BERG, initial HEP for strengthening/ balance    Person(s) Educated Spouse;Patient    Methods Explanation;Handout;Demonstration    Comprehension Verbalized understanding;Returned demonstration            PT Short Term Goals - 09/02/20 1712      PT SHORT TERM GOAL #1   Title Pt will be independent with initial HEP and verbalize understanding of decr fall risk in the home. ALL STGS DUE 09/24/20    Time 4    Period Weeks    Status New    Target Date 09/24/20      PT SHORT TERM GOAL #2   Title Pt will improve BERG to at least a 49/56 in order to demo decr fall risk.    Baseline BERG 44/56    Time 4    Period Weeks    Status Revised      PT SHORT TERM GOAL #3   Title Pt will perform 5x sit <> stand in 20 seconds or less with single UE support in order to demo incr balance.    Baseline 25 seconds    Time 4    Period Weeks    Status New      PT SHORT TERM GOAL #4   Title Pt will improve gait speed with RW vs. LRAD to at least 2.4 ft/sec in order to demo improved community mobility.    Baseline 2.00 ft/sec    Time 4    Period Weeks    Status New      PT SHORT TERM GOAL #5  Title Pt will perform TUG with no AD in 17 seconds or less in order to demo decr fall risk.    Baseline 20.63  seconds    Time 4    Period Weeks    Status New             PT Long Term Goals - 08/27/20 1516      PT LONG TERM GOAL #1   Title Pt will be independent with final HEP in order to build upon functional gains made in therapy. ALL LTGS DUE 10/22/20    Time 8    Period Weeks    Status New    Target Date 10/22/20      PT LONG TERM GOAL #2   Title BERG/DGI goal to be written as appropriate.    Time 8    Period Weeks    Status New      PT LONG TERM GOAL #3   Title Pt will improve gait speed with LRAD vs. no AD to at least 2.7 ft/sec in order to demo improved community mobility.    Baseline 2.00 ft/sec    Time 8    Period Weeks    Status New      PT LONG TERM GOAL #4   Title Pt will perform TUG with no AD in 13.5 seconds or less in order to demo decr fall risk.    Baseline 20.63 seconds    Time 8    Period Weeks    Status New      PT LONG TERM GOAL #5   Title Pt will ambulate at least 500' with supervision over indoor/outdoor surfaces with LRAD in order to demo improved community mobility.    Time 8    Period Weeks    Status New      Additional Long Term Goals   Additional Long Term Goals Yes      PT LONG TERM GOAL #6   Title Pt will ambulate 12 steps with single handrail and step to vs. step through pattern with supervision in order to safely go up/down the stairs in his house.    Baseline 4 steps B handrails step through with CGA    Time 8    Period Weeks    Status New                 Plan - 09/02/20 1744    Clinical Impression Statement Performed the BERG today with pt scoring a 44/56, putting pt at a significant risk for falls. LTG revised as appropriate. Remainder of session focused on inititating HEP for BLE strengthening and balance. Cues for incr R foot clearance with side stepping and step taps. No issues throughout session. Will continue to progress towards LTGs.    Personal Factors and Comorbidities Comorbidity 3+;Past/Current Experience     Comorbidities L MCA CVA, Coronary Artery Disease, Heart Failure, Hyperlipidemia, Osteoarthritis, GERD, Renal Cell Cancer requiring nephrectomy (active)    Examination-Activity Limitations Locomotion Level;Stand;Transfers;Stairs    Examination-Participation Restrictions Community Activity;Occupation   work (is a Customer service manager)   [employer] Stable/Uncomplicated    Rehab Potential Good    PT Frequency 2x / week    PT Duration 8 weeks    PT Treatment/Interventions ADLs/Self Care Home Management;Stair training;Gait training;DME Instruction;Functional mobility training;Therapeutic activities;Therapeutic exercise;Balance training;Neuromuscular re-education;Patient/family education;Orthotic Fit/Training;Passive range of motion;Vestibular;Energy conservation    PT Next Visit Plan how is HEP? RLE strengthening, standing balance.  gait training with LRAD (trial a cane), might want  to look into foot up brace for decr RLE foot clearance    Consulted and Agree with Plan of Care Patient;Family member/caregiver    Family Member Consulted wife, [name]           Patient will benefit from skilled therapeutic intervention in order to improve the following deficits and impairments:  Abnormal gait,Decreased activity tolerance,Decreased balance,Decreased endurance,Decreased knowledge of use of DME,Decreased range of motion,Decreased strength,Difficulty walking,Decreased safety awareness  Visit Diagnosis: Muscle weakness (generalized)  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 Acute on chronic combined systolic and diastolic CHF (congestive heart failure) ([place]) 07/17/2020  . Renal cell carcinoma ([place]) 07/17/2020  . Protein-calorie malnutrition, severe 07/08/2020  . Acute respiratory failure  with hypoxia ([place]) 07/05/2020  . Iron deficiency anemia due to chronic blood loss 06/19/2020  . OSA (obstructive sleep apnea) 01/09/2018  . Pulmonary nodule, left 03/15/2017  . Former smoker 11/04/2014  . Insomnia 05/27/2014  . Plantar wart of left foot 11/27/2010  . Type 2 diabetes mellitus with other specified complication ([place]) 51/70/0174  . PSA, INCREASED 09/15/2009  . Diverticulitis of colon 05/08/2009  . Asthma, moderate persistent 06/19/2008  . ANKLE PAIN, CHRONIC 06/05/2007  . Allergic rhinitis 05/17/2007  . ERECTILE DYSFUNCTION, SECONDARY TO MEDICATION 05/17/2007  . Hyperlipidemia 04/06/2007  . Anxiety state 04/06/2007  . Essential hypertension 04/06/2007  . Coronary artery disease involving native coronary artery of native heart without angina pectoris 04/06/2007    Arliss Journey, PT, DPT  09/02/2020, 5:45 PM  [facility] [address] [place], [place], [address] Phone: [phone]   Fax:  [phone]  Name: MERLYN CONLEY MRN: [id number] Date of Birth: Jan 18, 1956

## 2020-09-02 NOTE — Patient Instructions (Signed)
Access Code: [facility] URL: [email or web address] Date: 09/02/2020 Prepared by: Janann August  Exercises Sit to Stand with Armchair - 2 x daily - 5 x weekly - 2 sets - 5 reps Alternating Step Taps with Counter Support - 2 x daily - 5 x weekly - 1 sets - 10 reps Side Stepping with Counter Support - 2 x daily - 5 x weekly - 3 sets Standing Tandem Balance with Unilateral Counter Support - 2 x daily - 5 x weekly - 3 sets - 10-15 hold Staggered Stance Forward Backward Weight Shift with Counter Support - 2 x daily - 5 x weekly - 1-2 sets - 10 reps

## 2020-09-04 ENCOUNTER — Ambulatory Visit: Payer: BC Managed Care – PPO | Admitting: Occupational Therapy

## 2020-09-04 ENCOUNTER — Encounter: Payer: Self-pay | Admitting: Occupational Therapy

## 2020-09-04 ENCOUNTER — Ambulatory Visit: Payer: BC Managed Care – PPO | Admitting: Physical Therapy

## 2020-09-04 ENCOUNTER — Other Ambulatory Visit: Payer: Self-pay

## 2020-09-04 ENCOUNTER — Encounter: Payer: Self-pay | Admitting: Physical Therapy

## 2020-09-04 DIAGNOSIS — R2681 Unsteadiness on feet: Secondary | ICD-10-CM

## 2020-09-04 DIAGNOSIS — R278 Other lack of coordination: Secondary | ICD-10-CM

## 2020-09-04 DIAGNOSIS — M6281 Muscle weakness (generalized): Secondary | ICD-10-CM

## 2020-09-04 DIAGNOSIS — R2689 Other abnormalities of gait and mobility: Secondary | ICD-10-CM

## 2020-09-04 DIAGNOSIS — R41840 Attention and concentration deficit: Secondary | ICD-10-CM

## 2020-09-04 DIAGNOSIS — I69854 Hemiplegia and hemiparesis following other cerebrovascular disease affecting left non-dominant side: Secondary | ICD-10-CM | POA: Diagnosis not present

## 2020-09-04 NOTE — Therapy (Signed)
[facility] [address] [place], [place], [address] Phone: [phone]   Fax:  [phone]  Physical Therapy Treatment  Patient Details  Name: Larry Garner MRN: [id number] Date of Birth: [birth year] Referring Provider (PT): Redmond, Bea Laura, NP   Encounter Date: 09/04/2020   PT End of Session - 09/04/20 1156    Visit Number 3    Number of Visits 17    Date for PT Re-Evaluation 11/25/20   written for 60 day POC   Authorization Type BCBS, Medicare    PT Start Time 1100    PT Stop Time 1141    PT Time Calculation (min) 41 min    Equipment Utilized During Treatment Gait belt    Activity Tolerance Patient tolerated treatment well    Behavior During Therapy [facility] for tasks assessed/performed           Past Medical History:  Diagnosis Date  . Allergic rhinitis   . Allergy   . Anxiety   . Asthma   . CHF (congestive heart failure) ([place])   . Diabetes ([place])   . Diverticulitis   . Dyspnea   . GERD (gastroesophageal reflux disease)    very occ  . History of heart attack   . HLD (hyperlipidemia)   . HTN (hypertension)   . Myocardial infarction ([place]) 2000  . Sleep apnea    wears cpap   . Tubular adenoma of colon 09/2008    Past Surgical History:  Procedure Laterality Date  . CARDIAC CATHETERIZATION N/A 07/20/2016   Procedure: Left Heart Cath and Cors/Grafts Angiography;  Surgeon: Belva Crome, MD;  Location: [facility];  Service: Cardiovascular;  Laterality: N/A;  . CATARACT EXTRACTION Bilateral 06/06/2019   Dr. Tommy Rainwater. oct left, dec right 2020   . COLONOSCOPY    . CORONARY ANGIOPLASTY    . CORONARY ARTERY BYPASS GRAFT  06/1999  . POLYPECTOMY    . RENAL BIOPSY  06/2020  . TONSILLECTOMY     late50's early 60's    There were no vitals filed for this visit.   Subjective Assessment - 09/04/20 1107    Subjective Felt sore after last session. Did the counter exercises at home and they went well. Have  not yet tried the corner exercises for balance.    Patient is accompained by: Family member   beth   Pertinent History L MCA CVA, Coronary Artery Disease, Heart Failure, Hyperlipidemia, Osteoarthritis, GERD, Renal Cell Cancer requiring nephrectomy (active)    Limitations Standing;Walking;Other (comment)   stairs   How long can you walk comfortably? approx. 100' with RW    Patient Stated Goals go up and down the stairs by himself, find out what his new normal will be, walk without the RW    Currently in Pain? No/denies                             [facility] Adult PT Treatment/Exercise - 09/04/20 1108      Ambulation/Gait   Ambulation/Gait Yes    Ambulation/Gait Assistance 5: Supervision    Ambulation/Gait Assistance Details throughout session with RW    Gait Pattern Step-through pattern;Decreased stance time - left;Decreased dorsiflexion - right;Decreased weight shift to right    Ambulation Surface Level;Indoor    Stairs Yes    Stairs Assistance 5: Supervision    Stairs Assistance Details (indicate cue type and reason) 2 reps step to pattern ascending - pt  initially performing ascending with RLE, discussed when performing at home with LLE for incr ease for stronger leg, pt reporting it feeling easier with 2nd rep. then performed x2 reps ascending with step through pattern, pt performing all 4 reps descending with step through pattern    Stair Management Technique Two rails;Alternating pattern;Step to pattern    Number of Stairs 4    Height of Stairs 16      Exercises   Exercises Other Exercises;Knee/Hip      Knee/Hip Exercises: Aerobic   Stepper SciFit with BUE and BLE for strengthening, activity tolerance at gear 2.5 for 5 minutes               Balance Exercises - 09/04/20 0001      Balance Exercises: Standing   Standing Eyes Opened Narrow base of support (BOS);Foam/compliant surface;Limitations    Standing Eyes Opened Limitations 2 pillows in corner, x10 reps  head turns, x10 reps head nods - feet about hip width distance    Standing Eyes Closed Narrow base of support (BOS);Solid surface;Limitations    Standing Eyes Closed Limitations 2 x 30 seconds, x10 reps head turns, 2 x10 reps head nods with slight space between feet    Partial Tandem Stance Eyes open;30 secs;2 reps;Limitations    Partial Tandem Stance Limitations modified with RLE posteriorly, changed pt's HEP to this position vs. tandem stance    Retro Gait Upper extremity support;3 reps;Limitations    Retro Gait Limitations at countertop with single UE support, cues for incr step length and wider BOS    Marching Upper extremity assist 1;Forwards;Limitations    Marching Limitations 4 reps down and back at [employer]             PT Education - 09/04/20 1156    Education Details changed tandem stance to modified tandem stance for HEP with RLE posteriorly    Person(s) Educated Patient    Methods Explanation;Demonstration    Comprehension Verbalized understanding;Returned demonstration            PT Short Term Goals - 09/02/20 1712      PT SHORT TERM GOAL #1   Title Pt will be independent with initial HEP and verbalize understanding of decr fall risk in the home. ALL STGS DUE 09/24/20    Time 4    Period Weeks    Status New    Target Date 09/24/20      PT SHORT TERM GOAL #2   Title Pt will improve BERG to at least a 49/56 in order to demo decr fall risk.    Baseline BERG 44/56    Time 4    Period Weeks    Status Revised      PT SHORT TERM GOAL #3   Title Pt will perform 5x sit <> stand in 20 seconds or less with single UE support in order to demo incr balance.    Baseline 25 seconds    Time 4    Period Weeks    Status New      PT SHORT TERM GOAL #4   Title Pt will improve gait speed with RW vs. LRAD to at least 2.4 ft/sec in order to demo improved community mobility.    Baseline 2.00 ft/sec    Time 4    Period Weeks    Status New      PT SHORT TERM GOAL #5   Title  Pt will perform TUG with no AD in 17 seconds or less in  order to demo decr fall risk.    Baseline 20.63 seconds    Time 4    Period Weeks    Status New             PT Long Term Goals - 08/27/20 1516      PT LONG TERM GOAL #1   Title Pt will be independent with final HEP in order to build upon functional gains made in therapy. ALL LTGS DUE 10/22/20    Time 8    Period Weeks    Status New    Target Date 10/22/20      PT LONG TERM GOAL #2   Title BERG/DGI goal to be written as appropriate.    Time 8    Period Weeks    Status New      PT LONG TERM GOAL #3   Title Pt will improve gait speed with LRAD vs. no AD to at least 2.7 ft/sec in order to demo improved community mobility.    Baseline 2.00 ft/sec    Time 8    Period Weeks    Status New      PT LONG TERM GOAL #4   Title Pt will perform TUG with no AD in 13.5 seconds or less in order to demo decr fall risk.    Baseline 20.63 seconds    Time 8    Period Weeks    Status New      PT LONG TERM GOAL #5   Title Pt will ambulate at least 500' with supervision over indoor/outdoor surfaces with LRAD in order to demo improved community mobility.    Time 8    Period Weeks    Status New      Additional Long Term Goals   Additional Long Term Goals Yes      PT LONG TERM GOAL #6   Title Pt will ambulate 12 steps with single handrail and step to vs. step through pattern with supervision in order to safely go up/down the stairs in his house.    Baseline 4 steps B handrails step through with CGA    Time 8    Period Weeks    Status New                 Plan - 09/04/20 1157    Clinical Impression Statement Today's skilled session focused on BLE strengthening, balance with decr UE support, and stair training. Cued pt to ascend stairs at home with step to pattern leading with stronger LLE (pt currently performing leading with RLE). Pt challenged most with balance today with more narrow BOS and head nods for balance with eyes  closed on level ground and eyes open on 2 pillows. Will continue to progress towards LTGs.    Personal Factors and Comorbidities Comorbidity 3+;Past/Current Experience    Comorbidities L MCA CVA, Coronary Artery Disease, Heart Failure, Hyperlipidemia, Osteoarthritis, GERD, Renal Cell Cancer requiring nephrectomy (active)    Examination-Activity Limitations Locomotion Level;Stand;Transfers;Stairs    Examination-Participation Restrictions Community Activity;Occupation   work (is a Customer service manager)   [employer] Stable/Uncomplicated    Rehab Potential Good    PT Frequency 2x / week    PT Duration 8 weeks    PT Treatment/Interventions ADLs/Self Care Home Management;Stair training;Gait training;DME Instruction;Functional mobility training;Therapeutic activities;Therapeutic exercise;Balance training;Neuromuscular re-education;Patient/family education;Orthotic Fit/Training;Passive range of motion;Vestibular;Energy conservation    PT Next Visit Plan how is HEP? RLE strengthening, standing balance.  gait training with LRAD (trial a cane), scifit  Consulted and Agree with Plan of Care Patient;Family member/caregiver    Family Member Consulted wife, [name]           Patient will benefit from skilled therapeutic intervention in order to improve the following deficits and impairments:  Abnormal gait,Decreased activity tolerance,Decreased balance,Decreased endurance,Decreased knowledge of use of DME,Decreased range of motion,Decreased strength,Difficulty walking,Decreased safety awareness  Visit Diagnosis: Muscle weakness (generalized)  Other abnormalities of gait and mobility  Unsteadiness on feet  Other lack of coordination     Problem List Patient Active Problem List   Diagnosis Date Noted  . Acute on chronic combined systolic and diastolic CHF (congestive heart failure) ([place]) 07/17/2020  . Renal cell carcinoma ([place]) 07/17/2020  . Protein-calorie malnutrition, severe  07/08/2020  . Acute respiratory failure with hypoxia ([place]) 07/05/2020  . Iron deficiency anemia due to chronic blood loss 06/19/2020  . OSA (obstructive sleep apnea) 01/09/2018  . Pulmonary nodule, left 03/15/2017  . Former smoker 11/04/2014  . Insomnia 05/27/2014  . Plantar wart of left foot 11/27/2010  . Type 2 diabetes mellitus with other specified complication ([place]) [id number]  . PSA, INCREASED 09/15/2009  . Diverticulitis of colon 05/08/2009  . Asthma, moderate persistent 06/19/2008  . ANKLE PAIN, CHRONIC 06/05/2007  . Allergic rhinitis 05/17/2007  . ERECTILE DYSFUNCTION, SECONDARY TO MEDICATION 05/17/2007  . Hyperlipidemia 04/06/2007  . Anxiety state 04/06/2007  . Essential hypertension 04/06/2007  . Coronary artery disease involving native coronary artery of native heart without angina pectoris 04/06/2007    Arliss Journey, PT, DPT 09/04/2020, 11:59 AM  [facility] [address] [place], [place], [address] Phone: [phone]   Fax:  [phone]  Name: Larry Garner MRN: [id number] Date of Birth: [birth year]

## 2020-09-04 NOTE — Telephone Encounter (Signed)
Pt responded via my chart

## 2020-09-04 NOTE — Patient Instructions (Signed)
11. Grip Strengthening (Resistive Putty)   Squeeze putty using thumb and all fingers. Repeat 15 times. Do 1-2 sessions per day.   Extension (Assistive Putty)   Roll putty back and forth, being sure to use all fingertips. Repeat 3 times. Do 1-2 sessions per day.  Then pinch as below.   Palmar Pinch Strengthening (Resistive Putty)   Pinch putty between thumb and each fingertip in turn after rolling out    Finger and Thumb Extension (Resistive Putty)   With thumb and all fingers in center of putty donut, stretch out. Repeat 5-10 times. Do 1-2 sessions per day.      IP Fisting (Resistive Putty)   Keeping knuckles straight, bend fingertips to squeeze putty. Repeat 10 times. Do 1-2 sessions per day.  MP Flexion (Resistive Putty)   Bending only at large knuckles, press putty down against thumb. Keep fingertips straight. Repeat 10 times. Do 1-2 sessions per day.   Lateral Pinch Strengthening (Resistive Putty)    Squeeze between thumb and side of each finger in turn. Repeat 10 times. Do 1-2 sessions per day.   FINGERS: Extension (Putty)    Open hand and fingers to flatten putty. ___ reps per set, ___ sets per day, ___ days per week       FINGERS: Intrinsic (Putty)    Flatten putty by separating fingers. Keep fingers straight. ___ reps per set, ___ sets per day, ___ days per week

## 2020-09-04 NOTE — Therapy (Signed)
[facility] [address] [place], [place], [address] Phone: [phone]   Fax:  [phone]  Occupational Therapy Treatment  Patient Details  Name: Larry Garner MRN: [id number] Date of Birth: January 19, 1956 Referring Provider (OT): Bea Laura Redmond   Encounter Date: 09/04/2020   OT End of Session - 09/04/20 1235    Visit Number 3    Number of Visits 13    Date for OT Re-Evaluation 09/26/20    Authorization Type BCBS and Medicare    Authorization Time Period 10th visit progress note    Authorization - Visit Number 3    Authorization - Number of Visits 10    Progress Note Due on Visit 10    OT Start Time 1145    OT Stop Time 1230    OT Time Calculation (min) 45 min    Activity Tolerance Patient tolerated treatment well    Behavior During Therapy [facility] for tasks assessed/performed           Past Medical History:  Diagnosis Date  . Allergic rhinitis   . Allergy   . Anxiety   . Asthma   . CHF (congestive heart failure) ([place])   . Diabetes ([place])   . Diverticulitis   . Dyspnea   . GERD (gastroesophageal reflux disease)    very occ  . History of heart attack   . HLD (hyperlipidemia)   . HTN (hypertension)   . Myocardial infarction ([place]) 2000  . Sleep apnea    wears cpap   . Tubular adenoma of colon 09/2008    Past Surgical History:  Procedure Laterality Date  . CARDIAC CATHETERIZATION N/A 07/20/2016   Procedure: Left Heart Cath and Cors/Grafts Angiography;  Surgeon: Belva Crome, MD;  Location: [facility];  Service: Cardiovascular;  Laterality: N/A;  . CATARACT EXTRACTION Bilateral 06/06/2019   Dr. Tommy Rainwater. oct left, dec right 2020   . COLONOSCOPY    . CORONARY ANGIOPLASTY    . CORONARY ARTERY BYPASS GRAFT  06/1999  . POLYPECTOMY    . RENAL BIOPSY  06/2020  . TONSILLECTOMY     late50's early 60's    There were no vitals filed for this visit.   Subjective Assessment - 09/04/20 1151     Subjective  We worked on balance and strength - in PT    Patient is accompanied by: Family member    Limitations active renal cell cancer. fall risk. no driving    Patient Stated Goals I have trouble with dates and months and ordinal things    Currently in Pain? No/denies                        OT Treatments/Exercises (OP) - 09/04/20 0001      Hand Exercises   Other Hand Exercises Issued red putty exercises for patient.  See patient instructions.  Patien tneeding cueing for correct process, especially with LUE.                  OT Education - 09/04/20 1235    Education Details Red putty exercises.    Person(s) Educated Patient    Methods Explanation;Demonstration;Tactile cues;Verbal cues;Handout    Comprehension Verbalized understanding;Returned demonstration;Need further instruction            OT Short Term Goals - 09/04/20 1238      OT SHORT TERM GOAL #1   Title Pt will be independent with HEP 09/05/20  Time 3    Period Weeks    Status On-going    Target Date 09/05/20      OT SHORT TERM GOAL #2   Title Pt will increase grip strength in LUE to 42 lbs or more for increase in functional use of LUE.    Baseline RUE 52.6 LUE 38.8    Time 3    Period Weeks    Status On-going      OT SHORT TERM GOAL #3   Title Pt will perform simple warm meal prep with supervision for return to prior level of functioning    Time 3    Period Weeks    Status On-going      OT SHORT TERM GOAL #4   Title Pt will increase standing tolerance to 10 minutes or greater for increasing overall endurance and activity tolerance    Time 3    Period Weeks    Status On-going             OT Long Term Goals - 09/04/20 1238      OT LONG TERM GOAL #1   Title Pt will be independent with updated HEP 09/26/20    Time 6    Period Weeks    Status On-going      OT LONG TERM GOAL #2   Title Pt will increase grip strength in LUE to 48 lbs or greater for increase in overall  functional use of LUE    Baseline RUE 52.6 LUE 38.8    Time 6    Period Weeks    Status On-going      OT LONG TERM GOAL #3   Title Pt will complete 9 hole peg test in 32 seconds or less with LUE for increase in fine motor coordination.    Baseline RUE 28.69, LUE 37.03    Time 6    Period Weeks    Status On-going      OT LONG TERM GOAL #4   Title Pt will complete mod complex meal prep with mod I for increase in return to prior level of functioning and home management and meal prep tasks    Time 6    Period Weeks    Status On-going      OT LONG TERM GOAL #5   Title Pt will tolerate 15 minutes of continuous standing activities for increase in overall standing tolerance and activity tolerance for completing IADLs.    Time 6    Period Weeks    Status On-going      OT LONG TERM GOAL #6   Title Pt will complete physical and cognitive task simultaneously with 90% accuracy.    Time 6    Period Weeks    Status On-going      OT LONG TERM GOAL #7   Title Complete FOTO at discharge    Time 6    Period Weeks    Status On-going                 Plan - 09/04/20 1236    Clinical Impression Statement Patient has been compliant with coordination exercises.  Issued putty exercises.    OT Occupational Profile and History Problem Focused Assessment - Including review of records relating to presenting problem    Occupational performance deficits (Please refer to evaluation for details): IADL's;ADL's;Leisure    Body Structure / Function / Physical Skills ADL;Decreased knowledge of use of DME;Strength;GMC;Balance;UE functional use;IADL;ROM;Endurance;FMC;Coordination    Rehab Potential Good  Clinical Decision Making Limited treatment options, no task modification necessary    Comorbidities Affecting Occupational Performance: None    OT Frequency 2x / week    OT Duration 6 weeks    OT Treatment/Interventions Self-care/ADL training;DME and/or AE instruction;Therapeutic  activities;Therapeutic exercise;Functional Mobility Training;Energy conservation;Patient/family education    Plan Conditioning/activity tolerance, cooking - could check putty and coordination exericses    OT Home Exercise Plan coordination, putty    Consulted and Agree with Plan of Care Patient;Family member/caregiver    Family Member Consulted spouse, [name]           Patient will benefit from skilled therapeutic intervention in order to improve the following deficits and impairments:   Body Structure / Function / Physical Skills: ADL,Decreased knowledge of use of DME,Strength,GMC,Balance,UE functional use,IADL,ROM,Endurance,FMC,Coordination       Visit Diagnosis: Muscle weakness (generalized)  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 Acute on chronic combined systolic and diastolic CHF (congestive heart failure) ([place]) 07/17/2020  . Renal cell carcinoma ([place]) 07/17/2020  . Protein-calorie malnutrition, severe 07/08/2020  . Acute respiratory failure with hypoxia ([place]) 07/05/2020  . Iron deficiency anemia due to chronic blood loss 06/19/2020  . OSA (obstructive sleep apnea) 01/09/2018  . Pulmonary nodule, left 03/15/2017  . Former smoker 11/04/2014  . Insomnia 05/27/2014  . Plantar wart of left foot 11/27/2010  . Type 2 diabetes mellitus with other specified complication ([place]) 70/35/0093  . PSA, INCREASED 09/15/2009  . Diverticulitis of colon 05/08/2009  . Asthma, moderate persistent 06/19/2008  . ANKLE PAIN, CHRONIC 06/05/2007  . Allergic rhinitis 05/17/2007  . ERECTILE DYSFUNCTION, SECONDARY TO MEDICATION 05/17/2007  . Hyperlipidemia 04/06/2007  . Anxiety state 04/06/2007  . Essential hypertension 04/06/2007  . Coronary artery disease involving  native coronary artery of native heart without angina pectoris 04/06/2007    Mariah Milling, OTR/L 09/04/2020, 12:39 PM  [facility] [address] [place], [place], [address] Phone: [phone]   Fax:  [phone]  Name: Larry Garner MRN: [id number] Date of Birth: 10/18/1955

## 2020-09-05 ENCOUNTER — Ambulatory Visit: Payer: Medicare Other | Admitting: Cardiovascular Disease

## 2020-09-08 ENCOUNTER — Other Ambulatory Visit: Payer: Self-pay

## 2020-09-08 ENCOUNTER — Encounter: Payer: Self-pay | Admitting: Physical Therapy

## 2020-09-08 ENCOUNTER — Ambulatory Visit: Payer: BC Managed Care – PPO

## 2020-09-08 ENCOUNTER — Encounter: Payer: Self-pay | Admitting: Occupational Therapy

## 2020-09-08 ENCOUNTER — Ambulatory Visit: Payer: BC Managed Care – PPO | Admitting: Occupational Therapy

## 2020-09-08 ENCOUNTER — Ambulatory Visit: Payer: BC Managed Care – PPO | Admitting: Physical Therapy

## 2020-09-08 DIAGNOSIS — I69854 Hemiplegia and hemiparesis following other cerebrovascular disease affecting left non-dominant side: Secondary | ICD-10-CM

## 2020-09-08 DIAGNOSIS — M6281 Muscle weakness (generalized): Secondary | ICD-10-CM

## 2020-09-08 DIAGNOSIS — R278 Other lack of coordination: Secondary | ICD-10-CM

## 2020-09-08 DIAGNOSIS — R2689 Other abnormalities of gait and mobility: Secondary | ICD-10-CM

## 2020-09-08 DIAGNOSIS — R2681 Unsteadiness on feet: Secondary | ICD-10-CM

## 2020-09-08 DIAGNOSIS — R41840 Attention and concentration deficit: Secondary | ICD-10-CM

## 2020-09-08 DIAGNOSIS — R41844 Frontal lobe and executive function deficit: Secondary | ICD-10-CM

## 2020-09-08 DIAGNOSIS — R41841 Cognitive communication deficit: Secondary | ICD-10-CM

## 2020-09-08 DIAGNOSIS — R4701 Aphasia: Secondary | ICD-10-CM

## 2020-09-08 NOTE — Therapy (Signed)
[facility] [address] [place], [place], [address] Phone: [phone]   Fax:  [phone]  Occupational Therapy Treatment  Patient Details  Name: Larry Garner MRN: [id number] Date of Birth: [birth year] Referring Provider (OT): Bea Laura Redmond   Encounter Date: 09/08/2020   OT End of Session - 09/08/20 1018    Visit Number 4    Number of Visits 13    Date for OT Re-Evaluation 09/26/20    Authorization Type BCBS and Medicare    Authorization Time Period 10th visit progress note    Authorization - Visit Number 4    Authorization - Number of Visits 10    Progress Note Due on Visit 10    OT Start Time 1017    OT Stop Time 1100    OT Time Calculation (min) 43 min    Activity Tolerance Patient tolerated treatment well    Behavior During Therapy [facility] for tasks assessed/performed           Past Medical History:  Diagnosis Date  . Allergic rhinitis   . Allergy   . Anxiety   . Asthma   . CHF (congestive heart failure) ([place])   . Diabetes ([place])   . Diverticulitis   . Dyspnea   . GERD (gastroesophageal reflux disease)    very occ  . History of heart attack   . HLD (hyperlipidemia)   . HTN (hypertension)   . Myocardial infarction ([place]) 2000  . Sleep apnea    wears cpap   . Tubular adenoma of colon 09/2008    Past Surgical History:  Procedure Laterality Date  . CARDIAC CATHETERIZATION N/A 07/20/2016   Procedure: Left Heart Cath and Cors/Grafts Angiography;  Surgeon: Belva Crome, MD;  Location: [facility];  Service: Cardiovascular;  Laterality: N/A;  . CATARACT EXTRACTION Bilateral 06/06/2019   Dr. Tommy Rainwater. oct left, dec right 2020   . COLONOSCOPY    . CORONARY ANGIOPLASTY    . CORONARY ARTERY BYPASS GRAFT  06/1999  . POLYPECTOMY    . RENAL BIOPSY  06/2020  . TONSILLECTOMY     late50's early 60's    There were no vitals filed for this visit.   Subjective Assessment - 09/08/20 1018     Subjective  Pt denies any pain. Pt reports being here earlier for PT and ST and ran some errands between    Patient is accompanied by: Family member    Limitations active renal cell cancer. fall risk. no driving    Patient Stated Goals I have trouble with dates and months and ordinal things    Currently in Pain? No/denies                        OT Treatments/Exercises (OP) - 09/08/20 1019      ADLs   Cooking Pt reports cooking dinner over the weekend. Pt reports doing all of it and didn't get any help.      Work Horticulturist, commercial Arm Bike x 8 minutes level 2 for conditioning      Visual/Perceptual Exercises   Copy this Image Pegboard    Pegboard min cues at beginning. some difficulty with orange/yellow/red dfference.    Scanning Environmental    Scanning - Environmental 15/16 accuracy 94%      Functional Reaching Activities   High Level resistance clothespin 1-8# with LUE. Pt with increased time for activity. Pt removed pegs from  antenna while standing for icnrease in standing tolerance. Pt tolerated standing for approximately 5 minutes.      Fine Motor Coordination (Hand/Wrist)   Fine Motor Coordination Small Pegboard    Small Pegboard use of LUE for manipulation of small pegs. activity required increased time but pt had no drops. pt removed pegs iwth in hand manipulation with LUE                    OT Short Term Goals - 09/08/20 1022      OT SHORT TERM GOAL #1   Title Pt will be independent with HEP 09/05/20    Time 3    Period Weeks    Status Achieved    Target Date 09/05/20      OT SHORT TERM GOAL #2   Title Pt will increase grip strength in LUE to 42 lbs or more for increase in functional use of LUE.    Baseline RUE 52.6 LUE 38.8    Time 3    Period Weeks    Status On-going      OT SHORT TERM GOAL #3   Title Pt will perform simple warm meal prep with supervision for return to prior level of functioning    Time 3     Period Weeks    Status Achieved      OT SHORT TERM GOAL #4   Title Pt will increase standing tolerance to 10 minutes or greater for increasing overall endurance and activity tolerance    Time 3    Period Weeks    Status On-going             OT Long Term Goals - 09/08/20 1043      OT LONG TERM GOAL #1   Title Pt will be independent with updated HEP 09/26/20    Time 6    Period Weeks    Status On-going      OT LONG TERM GOAL #2   Title Pt will increase grip strength in LUE to 48 lbs or greater for increase in overall functional use of LUE    Baseline RUE 52.6 LUE 38.8    Time 6    Period Weeks    Status On-going      OT LONG TERM GOAL #3   Title Pt will complete 9 hole peg test in 32 seconds or less with LUE for increase in fine motor coordination.    Baseline RUE 28.69, LUE 37.03    Time 6    Period Weeks    Status On-going      OT LONG TERM GOAL #4   Title Pt will complete mod complex meal prep with mod I for increase in return to prior level of functioning and home management and meal prep tasks    Time 6    Period Weeks    Status On-going   completed at home with increased time.     OT LONG TERM GOAL #5   Title Pt will tolerate 15 minutes of continuous standing activities for increase in overall standing tolerance and activity tolerance for completing IADLs.    Time 6    Period Weeks    Status On-going      OT LONG TERM GOAL #6   Title Pt will complete physical and cognitive task simultaneously with 90% accuracy.    Time 6    Period Weeks    Status On-going      OT LONG TERM GOAL #7  Title Complete FOTO at discharge    Time 6    Period Weeks    Status On-going                 Plan - 09/08/20 1025    Clinical Impression Statement Pt compliant with all HEPs and increasing participation and independence with IADLs at home.    OT Occupational Profile and History Problem Focused Assessment - Including review of records relating to presenting problem     Occupational performance deficits (Please refer to evaluation for details): IADL's;ADL's;Leisure    Body Structure / Function / Physical Skills ADL;Decreased knowledge of use of DME;Strength;GMC;Balance;UE functional use;IADL;ROM;Endurance;FMC;Coordination    Rehab Potential Good    Clinical Decision Making Limited treatment options, no task modification necessary    Comorbidities Affecting Occupational Performance: None    OT Frequency 2x / week    OT Duration 6 weeks    OT Treatment/Interventions Self-care/ADL training;DME and/or AE instruction;Therapeutic activities;Therapeutic exercise;Functional Mobility Training;Energy conservation;Patient/family education    Plan conditioning and activity tolerance activities.    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 Acute on chronic combined systolic and diastolic CHF (congestive heart failure) ([place]) 07/17/2020  . Renal cell carcinoma ([place]) 07/17/2020  . Protein-calorie malnutrition, severe 07/08/2020  . Acute respiratory failure with hypoxia ([place]) 07/05/2020  . Iron deficiency anemia due to chronic blood loss 06/19/2020  . OSA  (obstructive sleep apnea) 01/09/2018  . Pulmonary nodule, left 03/15/2017  . Former smoker 11/04/2014  . Insomnia 05/27/2014  . Plantar wart of left foot 11/27/2010  . Type 2 diabetes mellitus with other specified complication ([place]) 42/59/5638  . PSA, INCREASED 09/15/2009  . Diverticulitis of colon 05/08/2009  . Asthma, moderate persistent 06/19/2008  . ANKLE PAIN, CHRONIC 06/05/2007  . Allergic rhinitis 05/17/2007  . ERECTILE DYSFUNCTION, SECONDARY TO MEDICATION 05/17/2007  . Hyperlipidemia 04/06/2007  . Anxiety state 04/06/2007  . Essential hypertension 04/06/2007  . Coronary artery disease involving native coronary artery of native heart without angina pectoris 04/06/2007    Zachery Conch MOT, OTR/L  09/08/2020, 11:01 [facility] [address] [place], [place], [address] Phone: [phone]   Fax:  [phone]  Name: Larry Garner MRN: [id number] Date of Birth: May 25, 1956

## 2020-09-08 NOTE — Therapy (Signed)
[facility] [address] [place], [place], [address] Phone: [phone]   Fax:  [phone]  Physical Therapy Treatment  Patient Details  Name: Larry Garner MRN: [id number] Date of Birth: Dec 29, 1955 Referring Provider (PT): Redmond, Bea Laura, NP   Encounter Date: 09/08/2020   PT End of Session - 09/08/20 1118    Visit Number 4    Number of Visits 17    Date for PT Re-Evaluation 11/25/20   written for 60 day POC   Authorization Type BCBS, Medicare    PT Start Time 0850    PT Stop Time 0931    PT Time Calculation (min) 41 min    Equipment Utilized During Treatment Gait belt    Activity Tolerance Patient tolerated treatment well    Behavior During Therapy [facility] for tasks assessed/performed           Past Medical History:  Diagnosis Date  . Allergic rhinitis   . Allergy   . Anxiety   . Asthma   . CHF (congestive heart failure) ([place])   . Diabetes ([place])   . Diverticulitis   . Dyspnea   . GERD (gastroesophageal reflux disease)    very occ  . History of heart attack   . HLD (hyperlipidemia)   . HTN (hypertension)   . Myocardial infarction ([place]) 2000  . Sleep apnea    wears cpap   . Tubular adenoma of colon 09/2008    Past Surgical History:  Procedure Laterality Date  . CARDIAC CATHETERIZATION N/A 07/20/2016   Procedure: Left Heart Cath and Cors/Grafts Angiography;  Surgeon: Belva Crome, MD;  Location: [facility];  Service: Cardiovascular;  Laterality: N/A;  . CATARACT EXTRACTION Bilateral 06/06/2019   Dr. Tommy Rainwater. oct left, dec right 2020   . COLONOSCOPY    . CORONARY ANGIOPLASTY    . CORONARY ARTERY BYPASS GRAFT  06/1999  . POLYPECTOMY    . RENAL BIOPSY  06/2020  . TONSILLECTOMY     late50's early 60's    There were no vitals filed for this visit.   Subjective Assessment - 09/08/20 0852    Subjective Has done the exercises at home and they are going well. No falls.    Patient is  accompained by: Family member   Larry Garner   Pertinent History L MCA CVA, Coronary Artery Disease, Heart Failure, Hyperlipidemia, Osteoarthritis, GERD, Renal Cell Cancer requiring nephrectomy (active)    Limitations Standing;Walking;Other (comment)   stairs   How long can you walk comfortably? approx. 100' with RW    Patient Stated Goals go up and down the stairs by himself, find out what his new normal will be, walk without the RW    Currently in Pain? No/denies                             [facility] Adult PT Treatment/Exercise - 09/08/20 0001      Transfers   Transfers Sit to Stand;Stand to Sit    Stand to Sit 5: Supervision;With upper extremity assist;To chair/3-in-1    Comments staggered stance sit <> stands with RLE posteriorly 2 x 5 reps using UE support to stand and no UE support/ minimal when sitting      Ambulation/Gait   Ambulation/Gait Yes    Ambulation/Gait Assistance 4: Min guard;4: Min assist    Ambulation/Gait Assistance Details initial demo and verbal cues for proper sequencing with SPC, pt improving with  sequencing with incr distances, min A when ambulating around turns, cues at times for heel strike with RLE    Ambulation Distance (Feet) 115 Feet    Assistive device Straight cane    Gait Pattern Step-through pattern;Decreased stance time - left;Decreased dorsiflexion - right;Decreased weight shift to right    Ambulation Surface Level;Indoor      Knee/Hip Exercises: Standing   Lateral Step Up Right;1 set;10 reps;Hand Hold: 2;Step Height: 4"    Forward Step Up Right;1 set;10 reps;Hand Hold: 2;Step Height: 4"   Cues for proper sequencing               Balance Exercises - 09/08/20 0001      Balance Exercises: Standing   SLS Eyes open;Modified    SLS with Vectors    SLS with Vectors Limitations modified SLS with RLE on the ground and LLE on 4" step - holding balance without UE support x30 seconds, then performing head motions 2 x 5 reps head turns, x5  reps vertical head nods with min guard for balance and intermittent touch to bars    Rockerboard Lateral;EO;Limitations    Rockerboard Limitations In M/L direction: weight shift with BUE > fingertip support x15 reps, holding board still 2 x 30 seconds    Other Standing Exercises on outside of // bars: heel <> toe raises x10 reps, pt with incr difficulty performing R ankle DF               PT Short Term Goals - 09/02/20 1712      PT SHORT TERM GOAL #1   Title Pt will be independent with initial HEP and verbalize understanding of decr fall risk in the home. ALL STGS DUE 09/24/20    Time 4    Period Weeks    Status New    Target Date 09/24/20      PT SHORT TERM GOAL #2   Title Pt will improve BERG to at least a 49/56 in order to demo decr fall risk.    Baseline BERG 44/56    Time 4    Period Weeks    Status Revised      PT SHORT TERM GOAL #3   Title Pt will perform 5x sit <> stand in 20 seconds or less with single UE support in order to demo incr balance.    Baseline 25 seconds    Time 4    Period Weeks    Status New      PT SHORT TERM GOAL #4   Title Pt will improve gait speed with RW vs. LRAD to at least 2.4 ft/sec in order to demo improved community mobility.    Baseline 2.00 ft/sec    Time 4    Period Weeks    Status New      PT SHORT TERM GOAL #5   Title Pt will perform TUG with no AD in 17 seconds or less in order to demo decr fall risk.    Baseline 20.63 seconds    Time 4    Period Weeks    Status New             PT Long Term Goals - 08/27/20 1516      PT LONG TERM GOAL #1   Title Pt will be independent with final HEP in order to build upon functional gains made in therapy. ALL LTGS DUE 10/22/20    Time 8    Period Weeks    Status New  Target Date 10/22/20      PT LONG TERM GOAL #2   Title BERG/DGI goal to be written as appropriate.    Time 8    Period Weeks    Status New      PT LONG TERM GOAL #3   Title Pt will improve gait speed with LRAD  vs. no AD to at least 2.7 ft/sec in order to demo improved community mobility.    Baseline 2.00 ft/sec    Time 8    Period Weeks    Status New      PT LONG TERM GOAL #4   Title Pt will perform TUG with no AD in 13.5 seconds or less in order to demo decr fall risk.    Baseline 20.63 seconds    Time 8    Period Weeks    Status New      PT LONG TERM GOAL #5   Title Pt will ambulate at least 500' with supervision over indoor/outdoor surfaces with LRAD in order to demo improved community mobility.    Time 8    Period Weeks    Status New      Additional Long Term Goals   Additional Long Term Goals Yes      PT LONG TERM GOAL #6   Title Pt will ambulate 12 steps with single handrail and step to vs. step through pattern with supervision in order to safely go up/down the stairs in his house.    Baseline 4 steps B handrails step through with CGA    Time 8    Period Weeks    Status New                 Plan - 09/08/20 1124    Clinical Impression Statement Initiated gait training today with SPC with pt needing min guard/min A for balance, esp going around turns. Pt improving with sequencing with incr gait distances. Needing cues intermittently at times for R heel strike. Remainder of session focused on RLE strengthening and balance with no issues noted. Intermittent seated rest breaks. Will continue to progress towards LTGs.    Personal Factors and Comorbidities Comorbidity 3+;Past/Current Experience    Comorbidities L MCA CVA, Coronary Artery Disease, Heart Failure, Hyperlipidemia, Osteoarthritis, GERD, Renal Cell Cancer requiring nephrectomy (active)    Examination-Activity Limitations Locomotion Level;Stand;Transfers;Stairs    Examination-Participation Restrictions Community Activity;Occupation   work (is a Customer service manager)   [employer] Stable/Uncomplicated    Rehab Potential Good    PT Frequency 2x / week    PT Duration 8 weeks    PT Treatment/Interventions  ADLs/Self Care Home Management;Stair training;Gait training;DME Instruction;Functional mobility training;Therapeutic activities;Therapeutic exercise;Balance training;Neuromuscular re-education;Patient/family education;Orthotic Fit/Training;Passive range of motion;Vestibular;Energy conservation    PT Next Visit Plan add ankle DF strength to HEP,  gait training with SPC. RLE strengthening, standing balance. scifit. stair training    Consulted and Agree with Plan of Care Patient;Family member/caregiver    Family Member Consulted wife, Larry Garner           Patient will benefit from skilled therapeutic intervention in order to improve the following deficits and impairments:  Abnormal gait,Decreased activity tolerance,Decreased balance,Decreased endurance,Decreased knowledge of use of DME,Decreased range of motion,Decreased strength,Difficulty walking,Decreased safety awareness  Visit Diagnosis: Muscle weakness (generalized)  Other abnormalities of gait and mobility  Unsteadiness on feet  Other lack of coordination     Problem List Patient Active Problem List   Diagnosis Date Noted  . Acute on chronic combined  systolic and diastolic CHF (congestive heart failure) ([place]) 07/17/2020  . Renal cell carcinoma ([place]) 07/17/2020  . Protein-calorie malnutrition, severe 07/08/2020  . Acute respiratory failure with hypoxia ([place]) 07/05/2020  . Iron deficiency anemia due to chronic blood loss 06/19/2020  . OSA (obstructive sleep apnea) 01/09/2018  . Pulmonary nodule, left 03/15/2017  . Former smoker 11/04/2014  . Insomnia 05/27/2014  . Plantar wart of left foot 11/27/2010  . Type 2 diabetes mellitus with other specified complication ([place]) 99991111  . PSA, INCREASED 09/15/2009  . Diverticulitis of colon 05/08/2009  . Asthma, moderate persistent 06/19/2008  . ANKLE PAIN, CHRONIC 06/05/2007  . Allergic rhinitis 05/17/2007  . ERECTILE DYSFUNCTION, SECONDARY TO MEDICATION 05/17/2007  . Hyperlipidemia  04/06/2007  . Anxiety state 04/06/2007  . Essential hypertension 04/06/2007  . Coronary artery disease involving native coronary artery of native heart without angina pectoris 04/06/2007    Arliss Journey, PT, DPT  09/08/2020, 11:25 AM  [facility] [address] [place], [place], [address] Phone: [phone]   Fax:  [phone]  Name: Larry Garner MRN: [id number] Date of Birth: June 07, 1956

## 2020-09-09 NOTE — Therapy (Signed)
[facility] [address] [place], [place], [address] Phone: [phone]   Fax:  [phone]  Speech Language Pathology Evaluation  Patient Details  Name: Larry Garner MRN: [id number] Date of Birth: 10/02/1955 Referring Provider (SLP): Teresa Coombs, [place]   Encounter Date: 09/08/2020   End of Session - 09/08/20 2352    Visit Number 1    Number of Visits 17    Date for SLP Re-Evaluation 12/05/20    SLP Start Time 0805    SLP Stop Time  4332    SLP Time Calculation (min) 42 min    Activity Tolerance Patient tolerated treatment well           Past Medical History:  Diagnosis Date  . Allergic rhinitis   . Allergy   . Anxiety   . Asthma   . CHF (congestive heart failure) ([place])   . Diabetes ([place])   . Diverticulitis   . Dyspnea   . GERD (gastroesophageal reflux disease)    very occ  . History of heart attack   . HLD (hyperlipidemia)   . HTN (hypertension)   . Myocardial infarction ([employer]) 2000  . Sleep apnea    wears cpap   . Tubular adenoma of colon 09/2008    Past Surgical History:  Procedure Laterality Date  . CARDIAC CATHETERIZATION N/A 07/20/2016   Procedure: Left Heart Cath and Cors/Grafts Angiography;  Surgeon: Belva Crome, MD;  Location: [facility];  Service: Cardiovascular;  Laterality: N/A;  . CATARACT EXTRACTION Bilateral 06/06/2019   Dr. Tommy Rainwater. oct left, dec right 2020   . COLONOSCOPY    . CORONARY ANGIOPLASTY    . CORONARY ARTERY BYPASS GRAFT  06/1999  . POLYPECTOMY    . RENAL BIOPSY  06/2020  . TONSILLECTOMY     late50's early 60's    There were no vitals filed for this visit.   Subjective Assessment - 09/08/20 0810    Subjective Memory skills are OK -at best." "I don't think I have all my vocabulary back yet but it's coming."    Patient is accompained by: Family member   wife-[name]   Currently in Pain? No/denies              SLP Evaluation [facility] - 09/08/20 2342       SLP Visit Information   SLP Received On 09/08/20    Referring Provider (SLP) Bea Laura Colby, [place]    Onset Date 07-31-20    Medical Diagnosis L MCA CVA      Subjective   Subjective "Sometiems it requires patience to get the precise word out that I'm looking for"    Patient/Family Stated Goal "To be able to communicate effectively."      Balance Screen   Has the patient fallen in the past 6 months Yes    How many times? 3      Prior Functional Status   Cognitive/Linguistic Baseline Within functional limits    Type of Home House     Lives With Spouse    Available Support Family;Friend(s)    Vocation Full time employment   Banker     Cognition   Overall Cognitive Status Impaired/Different from baseline    Area of Impairment Attention    Current Attention Level Sustained;Selective    Attention Comments Pt answered "yes" when asked if his focus seems different after the CVA, and that it is more difficult for him to pay attention during long discourse (  e.g. TV show about a biography)      Auditory Comprehension   Overall Auditory Comprehension Appears within functional limits for tasks assessed      Verbal Expression   Overall Verbal Expression Impaired    Initiation No impairment    Naming Impairment    Confrontation 75-100% accurate    Other Naming Comments Pt experienced anomia during eval today rarely, however pt and wife both tell SLP that his word finding skills are decr'd compared to pre-CVA. Pt's score on [employer] today are low-normal at 54/60 (mean=53.3, WNL is ~50/60 and above).    Effective Techniques Other (Comment)   extra time   Other Verbal Expression Comments Pt states expressing numbers is more difficult than prior to CVA. 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      Written Expression   Written Expression Unable to assess (comment)   tested name and address and pt WNL however no functional writing tested     Oral Motor/Sensory  Function   Overall Oral Motor/Sensory Function Appears within functional limits for tasks assessed      Motor Speech   Overall Motor Speech Appears within functional limits for tasks assessed                           SLP Education - 09/08/20 2351    Education Details aphasia information    Person(s) Educated Patient;Spouse    Methods Explanation;Handout    Comprehension Verbalized understanding            SLP Short Term Goals - 09/08/20 2358      SLP SHORT TERM GOAL #1   Title pt will name 8 items in abstract categories in 5/6 opportunities in 3 sessions    Time 4    Period Weeks    Status New      SLP SHORT TERM GOAL #2   Title pt will describe to SLP 3 anomia compensations    Time 4    Period Weeks    Status New      SLP SHORT TERM GOAL #3   Title pt will verbally sequence pertinent routines for pt with modified independence in 3 sessions    Time 4    Period Weeks    Status New      SLP SHORT TERM GOAL #4   Title pt will participate functionally in 10 minutes mod complex conversation using compensations for anomia successfully    Time 4    Period Weeks    Status New      SLP SHORT TERM GOAL #5   Title pt will participate in further testing as necessary (WAB-R or if pt desires, CLQT)    Time 2    Period Weeks   or 5 total sessions   Status New            SLP Long Term Goals - 09/09/20 0006      SLP LONG TERM GOAL #1   Title pt will participate functionally in 20 minutes mod complex/complex conversation using compensations for anomia successfully in 3 sessions    Time 8    Period Weeks   or 17 total sessions, for all LTGs   Status New      SLP LONG TERM GOAL #2   Title pt will express numbers with 100% success with self correction in 3 sessions    Time 8    Period  Weeks    Status New      SLP LONG TERM GOAL #3   Title pt will listen to a 20 minute discourse (lecture, TED talk, etc) and functionally give a verbal synopsis with  compensations (notes, anomia compensations, etc) in 3 sessions    Time Noel - 09/08/20 2352    Clinical Impression Statement Larry Garner presents today with reduced word finding ability as reported and observed today in evaluation, his [employer] score is only slightly above the mean. 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 Pt also with reported attentional deficits described as inability to pay attention to long discourse now compared to pre-morbidly. Additionally pt states he may struggle with managin his own medication at this time, indicating reduced organization/critical thinking skills. Pt will be administered the CLQT if he desires in his first therapy session. SLP believes pt would benefit from skilled ST to target expressive aphasia and if pt desires, cognitive communication/attention skills. He and wife deny swallowing difficulties during meals.    Speech Therapy Frequency 2x / week    Duration 8 weeks   or 17 sessions   Treatment/Interventions Language facilitation;Cueing hierarchy;SLP instruction and feedback;Compensatory strategies;Patient/family education;Internal/external aids;Cognitive reorganization;Multimodal communcation approach;Functional tasks    Potential to Achieve Goals Good    Consulted and Agree with Plan of Care Patient           Patient will benefit from skilled therapeutic intervention in order to improve the following deficits and impairments:   Aphasia  Cognitive communication deficit    Problem List Patient Active Problem List   Diagnosis Date Noted  . Acute on chronic combined systolic and diastolic CHF (congestive heart failure) ([place]) 07/17/2020  . Renal cell carcinoma ([place]) 07/17/2020  . Protein-calorie malnutrition, severe 07/08/2020  . Acute respiratory failure with hypoxia ([place])  07/05/2020  . Iron deficiency anemia due to chronic blood loss 06/19/2020  . OSA (obstructive sleep apnea) 01/09/2018  . Pulmonary nodule, left 03/15/2017  . Former smoker 11/04/2014  . Insomnia 05/27/2014  . Plantar wart of left foot 11/27/2010  . Type 2 diabetes mellitus with other specified complication ([place]) 99991111  . PSA, INCREASED 09/15/2009  . Diverticulitis of colon 05/08/2009  . Asthma, moderate persistent 06/19/2008  . ANKLE PAIN, CHRONIC 06/05/2007  . Allergic rhinitis 05/17/2007  . ERECTILE DYSFUNCTION, SECONDARY TO MEDICATION 05/17/2007  . Hyperlipidemia 04/06/2007  . Anxiety state 04/06/2007  . Essential hypertension 04/06/2007  . Coronary artery disease involving native coronary artery of native heart without angina pectoris 04/06/2007    [facility] ,MS, CCC-SLP  09/09/2020, 12:11 AM  [facility] [address] [place], [place], [address] Phone: [phone]   Fax:  [phone]  Name: Larry Garner MRN: [id number] Date of Birth: 12-15-1955

## 2020-09-10 ENCOUNTER — Encounter: Payer: Self-pay | Admitting: Occupational Therapy

## 2020-09-10 ENCOUNTER — Other Ambulatory Visit: Payer: Self-pay

## 2020-09-10 ENCOUNTER — Ambulatory Visit: Payer: BC Managed Care – PPO

## 2020-09-10 ENCOUNTER — Ambulatory Visit: Payer: BC Managed Care – PPO | Attending: Family Medicine | Admitting: Occupational Therapy

## 2020-09-10 DIAGNOSIS — R4701 Aphasia: Secondary | ICD-10-CM | POA: Insufficient documentation

## 2020-09-10 DIAGNOSIS — R2681 Unsteadiness on feet: Secondary | ICD-10-CM

## 2020-09-10 DIAGNOSIS — R41844 Frontal lobe and executive function deficit: Secondary | ICD-10-CM | POA: Insufficient documentation

## 2020-09-10 DIAGNOSIS — R2689 Other abnormalities of gait and mobility: Secondary | ICD-10-CM | POA: Insufficient documentation

## 2020-09-10 DIAGNOSIS — R278 Other lack of coordination: Secondary | ICD-10-CM

## 2020-09-10 DIAGNOSIS — I69854 Hemiplegia and hemiparesis following other cerebrovascular disease affecting left non-dominant side: Secondary | ICD-10-CM | POA: Insufficient documentation

## 2020-09-10 DIAGNOSIS — R41840 Attention and concentration deficit: Secondary | ICD-10-CM | POA: Diagnosis present

## 2020-09-10 DIAGNOSIS — M6281 Muscle weakness (generalized): Secondary | ICD-10-CM | POA: Insufficient documentation

## 2020-09-10 NOTE — Therapy (Signed)
[facility] [address] [place], [place], [address] Phone: [phone]   Fax:  [phone]  Physical Therapy Treatment  Patient Details  Name: Larry Garner MRN: [id number] Date of Birth: 08/06/1956 Referring Provider (PT): Redmond, Bea Laura, NP   Encounter Date: 09/10/2020   PT End of Session - 09/10/20 1241    Visit Number 5    Number of Visits 17    Date for PT Re-Evaluation 11/25/20    Authorization Type BCBS, Medicare    PT Start Time 0109    PT Stop Time 1320    PT Time Calculation (min) 45 min    Equipment Utilized During Treatment Gait belt    Activity Tolerance Patient tolerated treatment well    Behavior During Therapy [facility] for tasks assessed/performed           Past Medical History:  Diagnosis Date  . Allergic rhinitis   . Allergy   . Anxiety   . Asthma   . CHF (congestive heart failure) ([place])   . Diabetes ([place])   . Diverticulitis   . Dyspnea   . GERD (gastroesophageal reflux disease)    very occ  . History of heart attack   . HLD (hyperlipidemia)   . HTN (hypertension)   . Myocardial infarction ([place]) 2000  . Sleep apnea    wears cpap   . Tubular adenoma of colon 09/2008    Past Surgical History:  Procedure Laterality Date  . CARDIAC CATHETERIZATION N/A 07/20/2016   Procedure: Left Heart Cath and Cors/Grafts Angiography;  Surgeon: Belva Crome, MD;  Location: [facility];  Service: Cardiovascular;  Laterality: N/A;  . CATARACT EXTRACTION Bilateral 06/06/2019   Dr. Tommy Rainwater. oct left, dec right 2020   . COLONOSCOPY    . CORONARY ANGIOPLASTY    . CORONARY ARTERY BYPASS GRAFT  06/1999  . POLYPECTOMY    . RENAL BIOPSY  06/2020  . TONSILLECTOMY     late50's early 60's    There were no vitals filed for this visit.   Subjective Assessment - 09/10/20 1231    Subjective No reports of pain noted.  Has been complian twith HEP    Patient is accompained by: Family member   beth    Pertinent History L MCA CVA, Coronary Artery Disease, Heart Failure, Hyperlipidemia, Osteoarthritis, GERD, Renal Cell Cancer requiring nephrectomy (active)    Limitations Standing;Walking;Other (comment)   stairs   How long can you walk comfortably? approx. 100' with RW    Patient Stated Goals go up and down the stairs by himself, find out what his new normal will be, walk without the RW    Currently in Pain? No/denies                             [facility] Adult PT Treatment/Exercise - 09/10/20 0001      Transfers   Transfers Sit to Stand;Stand to Sit    Sit to Stand 4: Min guard    Sit to Stand Details (indicate cue type and reason) unable to stand w/o B UE support but able to eccentrically sit w/o need of UE support, performed 5 reps      Ambulation/Gait   Ambulation/Gait Yes    Ambulation/Gait Assistance 4: Min guard    Ambulation/Gait Assistance Details encourage heel strike    Ambulation Distance (Feet) 230 Feet   141ft in each direction   Assistive device Straight  cane   with quad tip   Gait Pattern Step-through pattern      Knee/Hip Exercises: Aerobic   Other Aerobic scifit 6' at L1.5               Balance Exercises - 09/10/20 0001      Balance Exercises: Standing   Standing Eyes Opened Narrow base of support (BOS);Foam/compliant surface;Limitations    Standing Eyes Opened Limitations 30s x2 on airex w/o need of UEsupport    Standing Eyes Closed Narrow base of support (BOS);Limitations;Foam/compliant surface    Standing Eyes Closed Limitations 2 x 30 seconds with slight space between feet    Rockerboard Lateral;EO;Limitations    Rockerboard Limitations weight shift side to side and fron to back with no need of fingertip support, holding board still 2 x 30 seconds    Step Ups Forward;Lateral;4 inch;Intermittent UE support    Step Ups Limitations performed from airex to 4"step, 15 reps per LE in both A/P and M/L directions               PT Short  Term Goals - 09/02/20 1712      PT SHORT TERM GOAL #1   Title Pt will be independent with initial HEP and verbalize understanding of decr fall risk in the home. ALL STGS DUE 09/24/20    Time 4    Period Weeks    Status New    Target Date 09/24/20      PT SHORT TERM GOAL #2   Title Pt will improve BERG to at least a 49/56 in order to demo decr fall risk.    Baseline BERG 44/56    Time 4    Period Weeks    Status Revised      PT SHORT TERM GOAL #3   Title Pt will perform 5x sit <> stand in 20 seconds or less with single UE support in order to demo incr balance.    Baseline 25 seconds    Time 4    Period Weeks    Status New      PT SHORT TERM GOAL #4   Title Pt will improve gait speed with RW vs. LRAD to at least 2.4 ft/sec in order to demo improved community mobility.    Baseline 2.00 ft/sec    Time 4    Period Weeks    Status New      PT SHORT TERM GOAL #5   Title Pt will perform TUG with no AD in 17 seconds or less in order to demo decr fall risk.    Baseline 20.63 seconds    Time 4    Period Weeks    Status New             PT Long Term Goals - 08/27/20 1516      PT LONG TERM GOAL #1   Title Pt will be independent with final HEP in order to build upon functional gains made in therapy. ALL LTGS DUE 10/22/20    Time 8    Period Weeks    Status New    Target Date 10/22/20      PT LONG TERM GOAL #2   Title BERG/DGI goal to be written as appropriate.    Time 8    Period Weeks    Status New      PT LONG TERM GOAL #3   Title Pt will improve gait speed with LRAD vs. no AD to at least 2.7 ft/sec in  order to demo improved community mobility.    Baseline 2.00 ft/sec    Time 8    Period Weeks    Status New      PT LONG TERM GOAL #4   Title Pt will perform TUG with no AD in 13.5 seconds or less in order to demo decr fall risk.    Baseline 20.63 seconds    Time 8    Period Weeks    Status New      PT LONG TERM GOAL #5   Title Pt will ambulate at least 500' with  supervision over indoor/outdoor surfaces with LRAD in order to demo improved community mobility.    Time 8    Period Weeks    Status New      Additional Long Term Goals   Additional Long Term Goals Yes      PT LONG TERM GOAL #6   Title Pt will ambulate 12 steps with single handrail and step to vs. step through pattern with supervision in order to safely go up/down the stairs in his house.    Baseline 4 steps B handrails step through with CGA    Time 8    Period Weeks    Status New                 Plan - 09/10/20 1242    Clinical Impression Statement Added Scifit fo aerobic component as well as warmup prio to gait training and treatment    Personal Factors and Comorbidities Comorbidity 3+;Past/Current Experience    Comorbidities L MCA CVA, Coronary Artery Disease, Heart Failure, Hyperlipidemia, Osteoarthritis, GERD, Renal Cell Cancer requiring nephrectomy (active)    Examination-Activity Limitations Locomotion Level;Stand;Transfers;Stairs    Examination-Participation Restrictions Community Activity;Occupation   work (is a Customer service manager)   [employer] Stable/Uncomplicated    Rehab Potential Good    PT Frequency 2x / week    PT Duration 8 weeks    PT Treatment/Interventions ADLs/Self Care Home Management;Stair training;Gait training;DME Instruction;Functional mobility training;Therapeutic activities;Therapeutic exercise;Balance training;Neuromuscular re-education;Patient/family education;Orthotic Fit/Training;Passive range of motion;Vestibular;Energy conservation    PT Next Visit Plan f/u on cane, eccentric sitting, ambulaiton with increased cadence as well as monitoring heel strike with gait    PT Home Exercise Plan added eccentric sitting to HEP, 5 reps per set    Consulted and Agree with Plan of Care Patient;Family member/caregiver    Family Member Consulted wife, [name]           Patient will benefit from skilled therapeutic intervention in order to improve  the following deficits and impairments:  Abnormal gait,Decreased activity tolerance,Decreased balance,Decreased endurance,Decreased knowledge of use of DME,Decreased range of motion,Decreased strength,Difficulty walking,Decreased safety awareness  Visit Diagnosis: Muscle weakness (generalized)  Unsteadiness on feet  Other abnormalities of gait and mobility     Problem List Patient Active Problem List   Diagnosis Date Noted  . Acute on chronic combined systolic and diastolic CHF (congestive heart failure) ([place]) 07/17/2020  . Renal cell carcinoma ([place]) 07/17/2020  . Protein-calorie malnutrition, severe 07/08/2020  . Acute respiratory failure with hypoxia ([place]) 07/05/2020  . Iron deficiency anemia due to chronic blood loss 06/19/2020  . OSA (obstructive sleep apnea) 01/09/2018  . Pulmonary nodule, left 03/15/2017  . Former smoker 11/04/2014  . Insomnia 05/27/2014  . Plantar wart of left foot 11/27/2010  . Type 2 diabetes mellitus with other specified complication ([place]) 99991111  . PSA, INCREASED 09/15/2009  . Diverticulitis of colon 05/08/2009  . Asthma, moderate persistent  06/19/2008  . ANKLE PAIN, CHRONIC 06/05/2007  . Allergic rhinitis 05/17/2007  . ERECTILE DYSFUNCTION, SECONDARY TO MEDICATION 05/17/2007  . Hyperlipidemia 04/06/2007  . Anxiety state 04/06/2007  . Essential hypertension 04/06/2007  . Coronary artery disease involving native coronary artery of native heart without angina pectoris 04/06/2007    Lanice Shirts PT 09/10/2020, 1:34 PM  [facility] [address] [place], [place], [address] Phone: [phone]   Fax:  [phone]  Name: Larry Garner MRN: [id number] Date of Birth: 1955/09/08

## 2020-09-10 NOTE — Therapy (Signed)
[facility] [address] [place], [place], [address] Phone: [phone]   Fax:  [phone]  Occupational Therapy Treatment  Patient Details  Name: Larry Garner MRN: [id number] Date of Birth: [birth year] Referring Provider (OT): [place]   Encounter Date: 09/10/2020   OT End of Session - 09/10/20 1512    Visit Number 5    Number of Visits 13    Date for OT Re-Evaluation 09/26/20    Authorization Type BCBS and Medicare    Authorization Time Period 10th visit progress note    Authorization - Visit Number 5    Authorization - Number of Visits 10    Progress Note Due on Visit 10    OT Start Time 1145    OT Stop Time 1230    OT Time Calculation (min) 45 min    Activity Tolerance Patient tolerated treatment well    Behavior During Therapy [facility] for tasks assessed/performed           Past Medical History:  Diagnosis Date  . Allergic rhinitis   . Allergy   . Anxiety   . Asthma   . CHF (congestive heart failure) ([place])   . Diabetes ([place])   . Diverticulitis   . Dyspnea   . GERD (gastroesophageal reflux disease)    very occ  . History of heart attack   . HLD (hyperlipidemia)   . HTN (hypertension)   . Myocardial infarction ([place]) 2000  . Sleep apnea    wears cpap   . Tubular adenoma of colon 09/2008    Past Surgical History:  Procedure Laterality Date  . CARDIAC CATHETERIZATION N/A 07/20/2016   Procedure: Left Heart Cath and Cors/Grafts Angiography;  Surgeon: Belva Crome, MD;  Location: [facility];  Service: Cardiovascular;  Laterality: N/A;  . CATARACT EXTRACTION Bilateral 06/06/2019   Dr. Tommy Rainwater. oct left, dec right 2020   . COLONOSCOPY    . CORONARY ANGIOPLASTY    . CORONARY ARTERY BYPASS GRAFT  06/1999  . POLYPECTOMY    . RENAL BIOPSY  06/2020  . TONSILLECTOMY     late50's early 60's    There were no vitals filed for this visit.   Subjective Assessment - 09/10/20 1149     Subjective  Immunotherapy yesterday - it's really a non-invent    Patient is accompanied by: Family member    Limitations active renal cell cancer. fall risk. no driving    Patient Stated Goals I have trouble with dates and months and ordinal things    Currently in Pain? No/denies                        OT Treatments/Exercises (OP) - 09/10/20 1505      ADLs   Functional Mobility Progressing toward goal achievement.  Patient with improved standiung tolerance today - able to stand and reach to floor and back up x 10 min.  Patient breathing heavier with activity - although O2 sats remained at 98-99%.  Worked with hand gripper to improve hand starength,  Patient has not met grip strength goal.  Progressing toward short and long term goals.                    OT Short Term Goals - 09/10/20 1210      OT SHORT TERM GOAL #1   Title Pt will be independent with HEP 09/05/20    Time 3  Period Weeks    Status Achieved    Target Date 09/05/20      OT SHORT TERM GOAL #2   Title Pt will increase grip strength in LUE to 42 lbs or more for increase in functional use of LUE.    Baseline RUE 52.6 LUE 38.8    Time 3    Period Weeks    Status On-going   47 right, 35 left 09/10/20     OT SHORT TERM GOAL #3   Title Pt will perform simple warm meal prep with supervision for return to prior level of functioning    Time 3    Period Weeks    Status Achieved      OT SHORT TERM GOAL #4   Title Pt will increase standing tolerance to 10 minutes or greater for increasing overall endurance and activity tolerance    Time 3    Period Weeks    Status Achieved             OT Long Term Goals - 09/10/20 1212      OT LONG TERM GOAL #1   Title Pt will be independent with updated HEP 09/26/20    Time 6    Period Weeks    Status On-going      OT LONG TERM GOAL #2   Title Pt will increase grip strength in LUE to 48 lbs or greater for increase in overall functional use of LUE     Baseline RUE 52.6 LUE 38.8    Time 6    Period Weeks    Status On-going      OT LONG TERM GOAL #3   Title Pt will complete 9 hole peg test in 32 seconds or less with LUE for increase in fine motor coordination.    Baseline RUE 28.69, LUE 37.03    Time 6    Period Weeks    Status Achieved   24.34     OT LONG TERM GOAL #4   Title Pt will complete mod complex meal prep with mod I for increase in return to prior level of functioning and home management and meal prep tasks    Time 6    Period Weeks    Status Achieved   completed at home with increased time.     OT LONG TERM GOAL #5   Title Pt will tolerate 15 minutes of continuous standing activities for increase in overall standing tolerance and activity tolerance for completing IADLs.    Time 6    Period Weeks    Status On-going      OT LONG TERM GOAL #6   Title Pt will complete physical and cognitive task simultaneously with 90% accuracy.    Time 6    Period Weeks    Status On-going      OT LONG TERM GOAL #7   Title Complete FOTO at discharge    Time 6    Period Weeks    Status On-going                 Plan - 09/10/20 1513    Clinical Impression Statement Pt progressing toward OT goals with the exception of grip strength    OT Occupational Profile and History Problem Focused Assessment - Including review of records relating to presenting problem    Occupational performance deficits (Please refer to evaluation for details): IADL's;ADL's;Leisure    Body Structure / Function / Physical Skills ADL;Decreased knowledge of use of DME;Strength;GMC;Balance;UE  functional use;IADL;ROM;Endurance;FMC;Coordination    Rehab Potential Good    Clinical Decision Making Limited treatment options, no task modification necessary    Comorbidities Affecting Occupational Performance: None    OT Frequency 2x / week    OT Duration 6 weeks    OT Treatment/Interventions Self-care/ADL training;DME and/or AE instruction;Therapeutic  activities;Therapeutic exercise;Functional Mobility Training;Energy conservation;Patient/family education    Plan conditioning and activity tolerance activities.    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 Acute on chronic combined systolic and diastolic CHF (congestive heart failure) ([place]) 07/17/2020  . Renal cell carcinoma ([place]) 07/17/2020  . Protein-calorie malnutrition, severe 07/08/2020  . Acute respiratory failure with hypoxia ([place]) 07/05/2020  . Iron deficiency anemia due to chronic blood loss 06/19/2020  . OSA (obstructive sleep apnea) 01/09/2018  . Pulmonary nodule, left 03/15/2017  . Former smoker 11/04/2014  . Insomnia 05/27/2014  . Plantar wart of left foot 11/27/2010  . Type 2 diabetes mellitus with other specified complication ([place]) 16/05/9603  . PSA, INCREASED 09/15/2009  . Diverticulitis of colon 05/08/2009  . Asthma, moderate persistent 06/19/2008  . ANKLE PAIN, CHRONIC 06/05/2007  . Allergic rhinitis 05/17/2007  . ERECTILE DYSFUNCTION, SECONDARY TO MEDICATION 05/17/2007  . Hyperlipidemia 04/06/2007  . Anxiety state 04/06/2007  . Essential hypertension 04/06/2007  . Coronary artery disease involving native coronary artery of native heart without angina pectoris 04/06/2007     Mariah Milling, OTR/L 09/10/2020, 3:15 PM  [facility] [address] [place], [place], [address] Phone: [phone]   Fax:  [phone]  Name: VERTIS SCHEIB MRN: [id number] Date of Birth: 01-03-1956

## 2020-09-11 ENCOUNTER — Ambulatory Visit (HOSPITAL_COMMUNITY): Payer: BC Managed Care – PPO | Attending: Cardiology

## 2020-09-11 DIAGNOSIS — I2510 Atherosclerotic heart disease of native coronary artery without angina pectoris: Secondary | ICD-10-CM | POA: Diagnosis present

## 2020-09-11 DIAGNOSIS — Z01818 Encounter for other preprocedural examination: Secondary | ICD-10-CM | POA: Diagnosis present

## 2020-09-11 DIAGNOSIS — I429 Cardiomyopathy, unspecified: Secondary | ICD-10-CM | POA: Diagnosis not present

## 2020-09-11 DIAGNOSIS — Z01810 Encounter for preprocedural cardiovascular examination: Secondary | ICD-10-CM

## 2020-09-11 LAB — ECHOCARDIOGRAM COMPLETE
Area-P 1/2: 4.39 cm2
MV M vel: 3.89 m/s
MV Peak grad: 60.5 mmHg
MV VTI: 1.97 cm2
P 1/2 time: 295 msec
S' Lateral: 4.3 cm

## 2020-09-14 ENCOUNTER — Other Ambulatory Visit: Payer: Self-pay | Admitting: Family Medicine

## 2020-09-15 ENCOUNTER — Other Ambulatory Visit: Payer: Self-pay

## 2020-09-15 ENCOUNTER — Encounter: Payer: Self-pay | Admitting: Physical Therapy

## 2020-09-15 ENCOUNTER — Other Ambulatory Visit: Payer: Self-pay | Admitting: Family Medicine

## 2020-09-15 ENCOUNTER — Ambulatory Visit: Payer: BC Managed Care – PPO | Admitting: Physical Therapy

## 2020-09-15 ENCOUNTER — Ambulatory Visit: Payer: BC Managed Care – PPO | Admitting: Occupational Therapy

## 2020-09-15 ENCOUNTER — Encounter: Payer: Self-pay | Admitting: Occupational Therapy

## 2020-09-15 DIAGNOSIS — R41840 Attention and concentration deficit: Secondary | ICD-10-CM

## 2020-09-15 DIAGNOSIS — R278 Other lack of coordination: Secondary | ICD-10-CM

## 2020-09-15 DIAGNOSIS — I69854 Hemiplegia and hemiparesis following other cerebrovascular disease affecting left non-dominant side: Secondary | ICD-10-CM

## 2020-09-15 DIAGNOSIS — M6281 Muscle weakness (generalized): Secondary | ICD-10-CM | POA: Diagnosis not present

## 2020-09-15 DIAGNOSIS — R2681 Unsteadiness on feet: Secondary | ICD-10-CM

## 2020-09-15 DIAGNOSIS — R2689 Other abnormalities of gait and mobility: Secondary | ICD-10-CM

## 2020-09-15 NOTE — Therapy (Signed)
[facility] [address] [place], [place], [address] Phone: [phone]   Fax:  [phone]  Occupational Therapy Treatment  Patient Details  Name: Larry Garner MRN: [id number] Date of Birth: 01-19-1956 Referring Provider (OT): Bea Laura Redmond   Encounter Date: 09/15/2020   OT End of Session - 09/15/20 1100    Visit Number 6    Number of Visits 13    Date for OT Re-Evaluation 09/26/20    Authorization Type BCBS and Medicare    Authorization Time Period 10th visit progress note    Authorization - Visit Number 6    Authorization - Number of Visits 10    Progress Note Due on Visit 10    OT Start Time 1100    OT Stop Time 1150    OT Time Calculation (min) 50 min    Activity Tolerance Patient tolerated treatment well    Behavior During Therapy [facility] for tasks assessed/performed           Past Medical History:  Diagnosis Date  . Allergic rhinitis   . Allergy   . Anxiety   . Asthma   . CHF (congestive heart failure) ([place])   . Diabetes ([place])   . Diverticulitis   . Dyspnea   . GERD (gastroesophageal reflux disease)    very occ  . History of heart attack   . HLD (hyperlipidemia)   . HTN (hypertension)   . Myocardial infarction ([place]) 2000  . Sleep apnea    wears cpap   . Tubular adenoma of colon 09/2008    Past Surgical History:  Procedure Laterality Date  . CARDIAC CATHETERIZATION N/A 07/20/2016   Procedure: Left Heart Cath and Cors/Grafts Angiography;  Surgeon: Belva Crome, MD;  Location: [facility];  Service: Cardiovascular;  Laterality: N/A;  . CATARACT EXTRACTION Bilateral 06/06/2019   Dr. Tommy Rainwater. oct left, dec right 2020   . COLONOSCOPY    . CORONARY ANGIOPLASTY    . CORONARY ARTERY BYPASS GRAFT  06/1999  . POLYPECTOMY    . RENAL BIOPSY  06/2020  . TONSILLECTOMY     late50's early 60's    There were no vitals filed for this visit.   Subjective Assessment - 09/15/20 1102     Subjective  I think it's going well. We have been exercising more. still challenging to do stairs, remembering phone numbers    Patient is accompanied by: Family member    Limitations active renal cell cancer. fall risk. no driving    Patient Stated Goals I have trouble with dates and months and ordinal things    Currently in Pain? No/denies                        OT Treatments/Exercises (OP) - 09/15/20 1112      Cognitive Exercises   Other Cognitive Exercises 1 Constant Therapy Repeat a Pattern Level 1 with 93% accuracy and 12.8 response time    Other Cognitive Exercises 2 Match the Picture - level 5 - only completed 2 out of 10 items with increased time required for task. mental fatigue set in and increase in mistakes      Hand Exercises   Other Hand Exercises LUE with level 2 and picking up 1 inch blocks    Other Hand Exercises DigiFlex 5.0 lb  x10 with LUE      Visual/Perceptual Exercises   Scanning Environmental    Scanning - Environmental 15/16 -  94% accuracy without cognitive aspect. Pt missed first card and found on second lap    Other Exercises reading a map on constant therapy level 2 with 80% accuracy and 77.29s response time                    OT Short Term Goals - 09/10/20 1210      OT SHORT TERM GOAL #1   Title Pt will be independent with HEP 09/05/20    Time 3    Period Weeks    Status Achieved    Target Date 09/05/20      OT SHORT TERM GOAL #2   Title Pt will increase grip strength in LUE to 42 lbs or more for increase in functional use of LUE.    Baseline RUE 52.6 LUE 38.8    Time 3    Period Weeks    Status On-going   47 right, 35 left 09/10/20     OT SHORT TERM GOAL #3   Title Pt will perform simple warm meal prep with supervision for return to prior level of functioning    Time 3    Period Weeks    Status Achieved      OT SHORT TERM GOAL #4   Title Pt will increase standing tolerance to 10 minutes or greater for increasing  overall endurance and activity tolerance    Time 3    Period Weeks    Status Achieved             OT Long Term Goals - 09/10/20 1212      OT LONG TERM GOAL #1   Title Pt will be independent with updated HEP 09/26/20    Time 6    Period Weeks    Status On-going      OT LONG TERM GOAL #2   Title Pt will increase grip strength in LUE to 48 lbs or greater for increase in overall functional use of LUE    Baseline RUE 52.6 LUE 38.8    Time 6    Period Weeks    Status On-going      OT LONG TERM GOAL #3   Title Pt will complete 9 hole peg test in 32 seconds or less with LUE for increase in fine motor coordination.    Baseline RUE 28.69, LUE 37.03    Time 6    Period Weeks    Status Achieved   24.34     OT LONG TERM GOAL #4   Title Pt will complete mod complex meal prep with mod I for increase in return to prior level of functioning and home management and meal prep tasks    Time 6    Period Weeks    Status Achieved   completed at home with increased time.     OT LONG TERM GOAL #5   Title Pt will tolerate 15 minutes of continuous standing activities for increase in overall standing tolerance and activity tolerance for completing IADLs.    Time 6    Period Weeks    Status On-going      OT LONG TERM GOAL #6   Title Pt will complete physical and cognitive task simultaneously with 90% accuracy.    Time 6    Period Weeks    Status On-going      OT LONG TERM GOAL #7   Title Complete FOTO at discharge    Time 6    Period Weeks  Status On-going                 Plan - 09/15/20 1259    Clinical Impression Statement Pt continues to progress towards OT goals.    OT Occupational Profile and History Problem Focused Assessment - Including review of records relating to presenting problem    Occupational performance deficits (Please refer to evaluation for details): IADL's;ADL's;Leisure    Body Structure / Function / Physical Skills ADL;Decreased knowledge of use of  DME;Strength;GMC;Balance;UE functional use;IADL;ROM;Endurance;FMC;Coordination    Rehab Potential Good    Clinical Decision Making Limited treatment options, no task modification necessary    Comorbidities Affecting Occupational Performance: None    OT Frequency 2x / week    OT Duration 6 weeks    OT Treatment/Interventions Self-care/ADL training;DME and/or AE instruction;Therapeutic activities;Therapeutic exercise;Functional Mobility Training;Energy conservation;Patient/family education    Plan conditioning, memory, grip strength    OT Home Exercise Plan coordination, putty    Consulted and Agree with Plan of Care Patient;Family member/caregiver    Family Member Consulted spouse, [name]           Patient will benefit from skilled therapeutic intervention in order to improve the following deficits and impairments:   Body Structure / Function / Physical Skills: ADL,Decreased knowledge of use of DME,Strength,GMC,Balance,UE functional use,IADL,ROM,Endurance,FMC,Coordination       Visit Diagnosis: Muscle weakness (generalized)  Other lack of coordination  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 Acute on chronic combined systolic and diastolic CHF (congestive heart failure) ([place]) 07/17/2020  . Renal cell carcinoma ([place]) 07/17/2020  . Protein-calorie malnutrition, severe 07/08/2020  . Acute respiratory failure with hypoxia ([place]) 07/05/2020  . Iron deficiency anemia due to chronic blood loss 06/19/2020  . OSA (obstructive sleep apnea) 01/09/2018  . Pulmonary nodule, left 03/15/2017  . Former smoker 11/04/2014  . Insomnia 05/27/2014  . Plantar wart of left foot 11/27/2010  . Type 2 diabetes mellitus with other specified complication ([place]) 53/61/4431  . PSA, INCREASED 09/15/2009  . Diverticulitis of colon  05/08/2009  . Asthma, moderate persistent 06/19/2008  . ANKLE PAIN, CHRONIC 06/05/2007  . Allergic rhinitis 05/17/2007  . ERECTILE DYSFUNCTION, SECONDARY TO MEDICATION 05/17/2007  . Hyperlipidemia 04/06/2007  . Anxiety state 04/06/2007  . Essential hypertension 04/06/2007  . Coronary artery disease involving native coronary artery of native heart without angina pectoris 04/06/2007    Zachery Conch MOT, OTR/L  09/15/2020, 12:59 PM  [facility] [address] [place], [place], [address] Phone: [phone]   Fax:  [phone]  Name: Larry Garner MRN: [id number] Date of Birth: [birth year]

## 2020-09-15 NOTE — Therapy (Signed)
[facility] [address] [place], [place], [address] Phone: [phone]   Fax:  [phone]  Physical Therapy Treatment  Patient Details  Name: Larry Garner MRN: [id number] Date of Birth: October 07, 1955 Referring Provider (PT): Redmond, Bea Laura, NP   Encounter Date: 09/15/2020   PT End of Session - 09/15/20 1124    Visit Number 6    Number of Visits 17    Date for PT Re-Evaluation 11/25/20    Authorization Type BCBS, Medicare    PT Start Time 1018    PT Stop Time 1059    PT Time Calculation (min) 41 min    Equipment Utilized During Treatment Gait belt    Activity Tolerance Patient tolerated treatment well    Behavior During Therapy [facility] for tasks assessed/performed           Past Medical History:  Diagnosis Date  . Allergic rhinitis   . Allergy   . Anxiety   . Asthma   . CHF (congestive heart failure) ([place])   . Diabetes ([place])   . Diverticulitis   . Dyspnea   . GERD (gastroesophageal reflux disease)    very occ  . History of heart attack   . HLD (hyperlipidemia)   . HTN (hypertension)   . Myocardial infarction ([place]) 2000  . Sleep apnea    wears cpap   . Tubular adenoma of colon 09/2008    Past Surgical History:  Procedure Laterality Date  . CARDIAC CATHETERIZATION N/A 07/20/2016   Procedure: Left Heart Cath and Cors/Grafts Angiography;  Surgeon: Belva Crome, MD;  Location: [facility];  Service: Cardiovascular;  Laterality: N/A;  . CATARACT EXTRACTION Bilateral 06/06/2019   Dr. Tommy Rainwater. oct left, dec right 2020   . COLONOSCOPY    . CORONARY ANGIOPLASTY    . CORONARY ARTERY BYPASS GRAFT  06/1999  . POLYPECTOMY    . RENAL BIOPSY  06/2020  . TONSILLECTOMY     late50's early 60's    There were no vitals filed for this visit.   Subjective Assessment - 09/15/20 1021    Subjective Has been doing his exercises at home. Bought a cane with a quad tip, used it a little bit at home.    Patient is  accompained by: Family member   Larry Garner   Pertinent History L MCA CVA, Coronary Artery Disease, Heart Failure, Hyperlipidemia, Osteoarthritis, GERD, Renal Cell Cancer requiring nephrectomy (active)    Limitations Standing;Walking;Other (comment)   stairs   How long can you walk comfortably? approx. 100' with RW    Patient Stated Goals go up and down the stairs by himself, find out what his new normal will be, walk without the RW    Currently in Pain? No/denies                             [facility] Adult PT Treatment/Exercise - 09/15/20 0001      Ambulation/Gait   Ambulation/Gait Yes    Ambulation/Gait Assistance 4: Min guard;5: Supervision    Ambulation/Gait Assistance Details pt brought in a small base quad cane that he recently purchased. adjusted the height as originally too low for pt, then ambulated with SPC in RUE, with initial cues for sequencing and heel strike, beginning with min guard and progressing to supervision. attempted having the cane in his L hand (opposite weaker RLE), but pt more unstable and reporting feeling more comfortable using cane in  RUE    Ambulation Distance (Feet) 230 Feet   x2, 115 x 1, plus additional distances throughout session between activities   Assistive device Straight cane   with quad tip   Gait Pattern Step-through pattern    Gait Comments discussed pt can use small base quad cane for small distances in the house with wife Larry Garner present, but to continue to use the RW at all times when ambulating out to appts/in the community      Exercises   Exercises Other Exercises    Other Exercises  seated R ankle DF with yellow band x10 reps, then with red band x10 reps, with verbal/demo cues for technique and full ROM - added to HEP               Balance Exercises - 09/15/20 0001      Balance Exercises: Standing   Standing Eyes Opened Narrow base of support (BOS);Foam/compliant surface;Limitations    Standing Eyes Opened Limitations on blue  air ex: x10 reps head turns, x10 reps head nod    Standing Eyes Closed Wide (BOA);Foam/compliant surface;2 reps;30 secs    Standing Eyes Closed Limitations on blue air ex    Stepping Strategy UE support;10 reps;Anterior;Limitations    Stepping Strategy Limitations standing on blue air ex with LUE support: stepping LLE off and back on x10 reps             PT Education - 09/15/20 1122    Education Details can walk small distances in the house with [facility] with quad tip with wife present, addition of seated ankle DF with red tband to HEP    Person(s) Educated Patient;Spouse    Methods Explanation;Demonstration    Comprehension Verbalized understanding;Returned demonstration            PT Short Term Goals - 09/02/20 1712      PT SHORT TERM GOAL #1   Title Pt will be independent with initial HEP and verbalize understanding of decr fall risk in the home. ALL STGS DUE 09/24/20    Time 4    Period Weeks    Status New    Target Date 09/24/20      PT SHORT TERM GOAL #2   Title Pt will improve BERG to at least a 49/56 in order to demo decr fall risk.    Baseline BERG 44/56    Time 4    Period Weeks    Status Revised      PT SHORT TERM GOAL #3   Title Pt will perform 5x sit <> stand in 20 seconds or less with single UE support in order to demo incr balance.    Baseline 25 seconds    Time 4    Period Weeks    Status New      PT SHORT TERM GOAL #4   Title Pt will improve gait speed with RW vs. LRAD to at least 2.4 ft/sec in order to demo improved community mobility.    Baseline 2.00 ft/sec    Time 4    Period Weeks    Status New      PT SHORT TERM GOAL #5   Title Pt will perform TUG with no AD in 17 seconds or less in order to demo decr fall risk.    Baseline 20.63 seconds    Time 4    Period Weeks    Status New             PT Long Term  Goals - 08/27/20 1516      PT LONG TERM GOAL #1   Title Pt will be independent with final HEP in order to build upon functional gains  made in therapy. ALL LTGS DUE 10/22/20    Time 8    Period Weeks    Status New    Target Date 10/22/20      PT LONG TERM GOAL #2   Title BERG/DGI goal to be written as appropriate.    Time 8    Period Weeks    Status New      PT LONG TERM GOAL #3   Title Pt will improve gait speed with LRAD vs. no AD to at least 2.7 ft/sec in order to demo improved community mobility.    Baseline 2.00 ft/sec    Time 8    Period Weeks    Status New      PT LONG TERM GOAL #4   Title Pt will perform TUG with no AD in 13.5 seconds or less in order to demo decr fall risk.    Baseline 20.63 seconds    Time 8    Period Weeks    Status New      PT LONG TERM GOAL #5   Title Pt will ambulate at least 500' with supervision over indoor/outdoor surfaces with LRAD in order to demo improved community mobility.    Time 8    Period Weeks    Status New      Additional Long Term Goals   Additional Long Term Goals Yes      PT LONG TERM GOAL #6   Title Pt will ambulate 12 steps with single handrail and step to vs. step through pattern with supervision in order to safely go up/down the stairs in his house.    Baseline 4 steps B handrails step through with CGA    Time 8    Period Weeks    Status New                 Plan - 09/15/20 1131    Clinical Impression Statement Continued to practice gait training with SPC with quad tip. Cleared pt for small distances in the home only with wife present. Continue to use RW out of the house in the community/when more fatigued. Added seated ankle DF with red tband resistance to HEP today. Will continue to progress towards LTGs.    Personal Factors and Comorbidities Comorbidity 3+;Past/Current Experience    Comorbidities L MCA CVA, Coronary Artery Disease, Heart Failure, Hyperlipidemia, Osteoarthritis, GERD, Renal Cell Cancer requiring nephrectomy (active)    Examination-Activity Limitations Locomotion Level;Stand;Transfers;Stairs    Examination-Participation  Restrictions Community Activity;Occupation   work (is a Customer service manager)   [employer] Stable/Uncomplicated    Rehab Potential Good    PT Frequency 2x / week    PT Duration 8 weeks    PT Treatment/Interventions ADLs/Self Care Home Management;Stair training;Gait training;DME Instruction;Functional mobility training;Therapeutic activities;Therapeutic exercise;Balance training;Neuromuscular re-education;Patient/family education;Orthotic Fit/Training;Passive range of motion;Vestibular;Energy conservation    PT Next Visit Plan begin to check STGs. continue gait training with [place] w/ quad tip - obstacle negotiation. balance on compliant surfaces. RLE strength. [place] for aerobic warm up    PT Home Exercise Plan added eccentric sitting to HEP, 5 reps per set    Consulted and Agree with Plan of Care Patient;Family member/caregiver    Family Member Consulted wife, Larry Garner           Patient will benefit from  skilled therapeutic intervention in order to improve the following deficits and impairments:  Abnormal gait,Decreased activity tolerance,Decreased balance,Decreased endurance,Decreased knowledge of use of DME,Decreased range of motion,Decreased strength,Difficulty walking,Decreased safety awareness  Visit Diagnosis: Muscle weakness (generalized)  Unsteadiness on feet  Other abnormalities of gait and mobility  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 Acute on chronic combined systolic and diastolic CHF (congestive heart failure) ([place]) 07/17/2020  . Renal cell carcinoma ([place]) 07/17/2020  . Protein-calorie malnutrition, severe 07/08/2020  . Acute respiratory failure with hypoxia ([place]) 07/05/2020  . Iron deficiency anemia due to chronic blood loss 06/19/2020  . OSA (obstructive sleep apnea) 01/09/2018  . Pulmonary nodule, left 03/15/2017  .  Former smoker 11/04/2014  . Insomnia 05/27/2014  . Plantar wart of left foot 11/27/2010  . Type 2 diabetes mellitus with other specified complication ([place]) 38/93/7342  . PSA, INCREASED 09/15/2009  . Diverticulitis of colon 05/08/2009  . Asthma, moderate persistent 06/19/2008  . ANKLE PAIN, CHRONIC 06/05/2007  . Allergic rhinitis 05/17/2007  . ERECTILE DYSFUNCTION, SECONDARY TO MEDICATION 05/17/2007  . Hyperlipidemia 04/06/2007  . Anxiety state 04/06/2007  . Essential hypertension 04/06/2007  . Coronary artery disease involving native coronary artery of native heart without angina pectoris 04/06/2007    Arliss Journey, PT, DPT  09/15/2020, 11:32 AM  [facility] [address] [place], [place], [address] Phone: [phone]   Fax:  [phone]  Name: Larry Garner MRN: [id number] Date of Birth: [birth year]

## 2020-09-16 ENCOUNTER — Encounter: Payer: Self-pay | Admitting: Family Medicine

## 2020-09-17 ENCOUNTER — Other Ambulatory Visit: Payer: Self-pay

## 2020-09-17 ENCOUNTER — Ambulatory Visit: Payer: BC Managed Care – PPO | Admitting: Occupational Therapy

## 2020-09-17 ENCOUNTER — Ambulatory Visit: Payer: BC Managed Care – PPO

## 2020-09-17 ENCOUNTER — Encounter: Payer: Self-pay | Admitting: Occupational Therapy

## 2020-09-17 DIAGNOSIS — R278 Other lack of coordination: Secondary | ICD-10-CM

## 2020-09-17 DIAGNOSIS — I69854 Hemiplegia and hemiparesis following other cerebrovascular disease affecting left non-dominant side: Secondary | ICD-10-CM

## 2020-09-17 DIAGNOSIS — R2689 Other abnormalities of gait and mobility: Secondary | ICD-10-CM

## 2020-09-17 DIAGNOSIS — R41840 Attention and concentration deficit: Secondary | ICD-10-CM

## 2020-09-17 DIAGNOSIS — R2681 Unsteadiness on feet: Secondary | ICD-10-CM

## 2020-09-17 DIAGNOSIS — R41844 Frontal lobe and executive function deficit: Secondary | ICD-10-CM

## 2020-09-17 DIAGNOSIS — M6281 Muscle weakness (generalized): Secondary | ICD-10-CM | POA: Diagnosis not present

## 2020-09-17 NOTE — Therapy (Signed)
[facility] [address] [place], [place], [address] Phone: [phone]   Fax:  [phone]  Occupational Therapy Treatment  Patient Details  Name: Larry Garner MRN: [id number] Date of Birth: June 19, 1956 Referring Provider (OT): Bea Laura Redmond   Encounter Date: 09/17/2020   OT End of Session - 09/17/20 1016    Visit Number 7    Number of Visits 13    Date for OT Re-Evaluation 09/26/20    Authorization Type BCBS and Medicare    Authorization Time Period 10th visit progress note    Authorization - Visit Number 7    Authorization - Number of Visits 10    Progress Note Due on Visit 10    OT Start Time 1015    OT Stop Time 1059    OT Time Calculation (min) 44 min    Activity Tolerance Patient tolerated treatment well    Behavior During Therapy [facility] for tasks assessed/performed           Past Medical History:  Diagnosis Date  . Allergic rhinitis   . Allergy   . Anxiety   . Asthma   . CHF (congestive heart failure) ([place])   . Diabetes ([place])   . Diverticulitis   . Dyspnea   . GERD (gastroesophageal reflux disease)    very occ  . History of heart attack   . HLD (hyperlipidemia)   . HTN (hypertension)   . Myocardial infarction ([place]) 2000  . Sleep apnea    wears cpap   . Tubular adenoma of colon 09/2008    Past Surgical History:  Procedure Laterality Date  . CARDIAC CATHETERIZATION N/A 07/20/2016   Procedure: Left Heart Cath and Cors/Grafts Angiography;  Surgeon: Belva Crome, MD;  Location: [facility];  Service: Cardiovascular;  Laterality: N/A;  . CATARACT EXTRACTION Bilateral 06/06/2019   Dr. Tommy Rainwater. oct left, dec right 2020   . COLONOSCOPY    . CORONARY ANGIOPLASTY    . CORONARY ARTERY BYPASS GRAFT  06/1999  . POLYPECTOMY    . RENAL BIOPSY  06/2020  . TONSILLECTOMY     late50's early 60's    There were no vitals filed for this visit.   Subjective Assessment - 09/17/20 1016     Subjective  "feeling good" pt denies any pain.    Patient is accompanied by: Family member    Limitations active renal cell cancer. fall risk. no driving    Patient Stated Goals I have trouble with dates and months and ordinal things    Currently in Pain? No/denies                        OT Treatments/Exercises (OP) - 09/17/20 1043      ADLs   Functional Mobility standing tolerance with standing and reaching with LUE to place resistance clothespins 1-8# with LUE to top of mirror on peg board. Pt with no LOB and with good standing tolerance for activity    ADL Comments physical and cognitive tasks with approx 75-80% accuracy. Pt lost place a few times and made approx 4-5 mistakes without correcting. Pt self-corrected errors on a number of occassions. Pt would often need to stop walking in order to think about subtracting by 3, even for single digits and incorrect like 9-3=3      Cognitive Exercises   Problem Solving Logic Links level 19 with min cues for attention to detail with clue. Level 20 with  moderate cues. Level 21 with moderates  cues. Pt with impaired mental flexibility for changing and problem solving                    OT Short Term Goals - 09/10/20 1210      OT SHORT TERM GOAL #1   Title Pt will be independent with HEP 09/05/20    Time 3    Period Weeks    Status Achieved    Target Date 09/05/20      OT SHORT TERM GOAL #2   Title Pt will increase grip strength in LUE to 42 lbs or more for increase in functional use of LUE.    Baseline RUE 52.6 LUE 38.8    Time 3    Period Weeks    Status On-going   47 right, 35 left 09/10/20     OT SHORT TERM GOAL #3   Title Pt will perform simple warm meal prep with supervision for return to prior level of functioning    Time 3    Period Weeks    Status Achieved      OT SHORT TERM GOAL #4   Title Pt will increase standing tolerance to 10 minutes or greater for increasing overall endurance and activity  tolerance    Time 3    Period Weeks    Status Achieved             OT Long Term Goals - 09/17/20 1041      OT LONG TERM GOAL #1   Title Pt will be independent with updated HEP 09/26/20    Time 6    Period Weeks    Status On-going      OT LONG TERM GOAL #2   Title Pt will increase grip strength in LUE to 48 lbs or greater for increase in overall functional use of LUE    Baseline RUE 52.6 LUE 38.8    Time 6    Period Weeks    Status On-going      OT LONG TERM GOAL #3   Title Pt will complete 9 hole peg test in 32 seconds or less with LUE for increase in fine motor coordination.    Baseline RUE 28.69, LUE 37.03    Time 6    Period Weeks    Status Achieved   24.34     OT LONG TERM GOAL #4   Title Pt will complete mod complex meal prep with mod I for increase in return to prior level of functioning and home management and meal prep tasks    Time 6    Period Weeks    Status Achieved   completed at home with increased time.     OT LONG TERM GOAL #5   Title Pt will tolerate 15 minutes of continuous standing activities for increase in overall standing tolerance and activity tolerance for completing IADLs.    Time 6    Period Weeks    Status On-going      OT LONG TERM GOAL #6   Title Pt will complete physical and cognitive task simultaneously with 90% accuracy.    Time 6    Period Weeks    Status On-going   75-80% with diff continuing walking with thinking     OT LONG TERM GOAL #7   Title Complete FOTO at discharge    Time 6    Period Weeks    Status On-going  Plan - 09/17/20 1317    Clinical Impression Statement Pt continues to progress towards OT goals. Pt with difficulty with dual tasks this day (phys/cog)    OT Occupational Profile and History Problem Focused Assessment - Including review of records relating to presenting problem    Occupational performance deficits (Please refer to evaluation for details): IADL's;ADL's;Leisure    Body  Structure / Function / Physical Skills ADL;Decreased knowledge of use of DME;Strength;GMC;Balance;UE functional use;IADL;ROM;Endurance;FMC;Coordination    Rehab Potential Good    Clinical Decision Making Limited treatment options, no task modification necessary    Comorbidities Affecting Occupational Performance: None    OT Frequency 2x / week    OT Duration 6 weeks    OT Treatment/Interventions Self-care/ADL training;DME and/or AE instruction;Therapeutic activities;Therapeutic exercise;Functional Mobility Training;Energy conservation;Patient/family education    Plan conditioning, memory, grip strength    OT Home Exercise Plan coordination, putty    Consulted and Agree with Plan of Care Patient;Family member/caregiver    Family Member Consulted spouse, [name]           Patient will benefit from skilled therapeutic intervention in order to improve the following deficits and impairments:   Body Structure / Function / Physical Skills: ADL,Decreased knowledge of use of DME,Strength,GMC,Balance,UE functional use,IADL,ROM,Endurance,FMC,Coordination       Visit Diagnosis: Muscle weakness (generalized)  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 Acute on chronic combined systolic and diastolic CHF (congestive heart failure) ([place]) 07/17/2020  . Renal cell carcinoma ([place]) 07/17/2020  . Protein-calorie malnutrition, severe 07/08/2020  . Acute respiratory failure with hypoxia ([place]) 07/05/2020  . Iron deficiency anemia due to chronic blood loss 06/19/2020  . OSA (obstructive sleep apnea) 01/09/2018  . Pulmonary nodule, left 03/15/2017  . Former smoker 11/04/2014  . Insomnia 05/27/2014  . Plantar wart of left foot 11/27/2010  . Type 2 diabetes  mellitus with other specified complication ([place]) 09/81/1914  . PSA, INCREASED 09/15/2009  . Diverticulitis of colon 05/08/2009  . Asthma, moderate persistent 06/19/2008  . ANKLE PAIN, CHRONIC 06/05/2007  . Allergic rhinitis 05/17/2007  . ERECTILE DYSFUNCTION, SECONDARY TO MEDICATION 05/17/2007  . Hyperlipidemia 04/06/2007  . Anxiety state 04/06/2007  . Essential hypertension 04/06/2007  . Coronary artery disease involving native coronary artery of native heart without angina pectoris 04/06/2007    Zachery Conch MOT, OTR/L  09/17/2020, 1:18 PM  [facility] [address] [place], [place], [address] Phone: [phone]   Fax:  [phone]  Name: Larry Garner MRN: [id number] Date of Birth: Dec 27, 1955

## 2020-09-17 NOTE — Therapy (Signed)
[facility] [address] [place], [place], [address] Phone: [phone]   Fax:  [phone]  Physical Therapy Treatment  Patient Details  Name: Larry Garner MRN: [id number] Date of Birth: February 14, 1956 Referring Provider (PT): Redmond, Bea Laura, NP   Encounter Date: 09/17/2020   PT End of Session - 09/17/20 1104    Visit Number 7    Number of Visits 17    Date for PT Re-Evaluation 11/25/20    Authorization Type BCBS, Medicare    PT Start Time 1100    PT Stop Time 1144    PT Time Calculation (min) 44 min    Equipment Utilized During Treatment Gait belt    Activity Tolerance Patient tolerated treatment well    Behavior During Therapy [facility] for tasks assessed/performed           Past Medical History:  Diagnosis Date  . Allergic rhinitis   . Allergy   . Anxiety   . Asthma   . CHF (congestive heart failure) ([place])   . Diabetes ([place])   . Diverticulitis   . Dyspnea   . GERD (gastroesophageal reflux disease)    very occ  . History of heart attack   . HLD (hyperlipidemia)   . HTN (hypertension)   . Myocardial infarction ([place]) 2000  . Sleep apnea    wears cpap   . Tubular adenoma of colon 09/2008    Past Surgical History:  Procedure Laterality Date  . CARDIAC CATHETERIZATION N/A 07/20/2016   Procedure: Left Heart Cath and Cors/Grafts Angiography;  Surgeon: Belva Crome, MD;  Location: [facility];  Service: Cardiovascular;  Laterality: N/A;  . CATARACT EXTRACTION Bilateral 06/06/2019   Dr. Tommy Rainwater. oct left, dec right 2020   . COLONOSCOPY    . CORONARY ANGIOPLASTY    . CORONARY ARTERY BYPASS GRAFT  06/1999  . POLYPECTOMY    . RENAL BIOPSY  06/2020  . TONSILLECTOMY     late50's early 60's    There were no vitals filed for this visit.   Subjective Assessment - 09/17/20 1101    Subjective Reports that he is walking in the house with cane some, no falls. Reports exercises are going well.    Patient  is accompained by: Family member   beth   Pertinent History L MCA CVA, Coronary Artery Disease, Heart Failure, Hyperlipidemia, Osteoarthritis, GERD, Renal Cell Cancer requiring nephrectomy (active)    Limitations Standing;Walking;Other (comment)   stairs   How long can you walk comfortably? approx. 100' with RW    Patient Stated Goals go up and down the stairs by himself, find out what his new normal will be, walk without the RW    Currently in Pain? No/denies              [facility] Adult PT Treatment/Exercise - 09/17/20 0001      Ambulation/Gait   Ambulation/Gait Yes    Ambulation/Gait Assistance 4: Min guard;5: Supervision    Ambulation/Gait Assistance Details completed ambulation with SPC with quad tip x 200 ft with close supervision and intermittent CGA from PT.    Ambulation Distance (Feet) 200 Feet    Assistive device Straight cane   with quad tip   Gait Pattern Step-through pattern    Ambulation Surface Level;Indoor    Gait velocity 20.53 secs = 1.60 ft/sec   with SPC (decreased AD)     High Level Balance   High Level Balance Activities Backward walking;Marching forwards  High Level Balance Comments In // bars completed marching forwads followed by backwards walking x 2 laps down and back on firm surface, then progressed to completing on blue mat x 3 laps down and back. increased challenge on blue mat.      Knee/Hip Exercises: Aerobic   Other Aerobic Completed SciFit on Level 1.8 x 5 minutes with BUE/BLE for improved endurance/activity tolerance. Patient tolerating incrase in resistance well with completion.               Balance Exercises - 09/17/20 0001      Balance Exercises: Standing   Standing Eyes Opened Narrow base of support (BOS);Foam/compliant surface;Limitations;Head turns    Standing Eyes Opened Limitations standing on airex completed horizontal/vertical head turns x 10 reps each direction. increased challenge with vertical.    Standing Eyes Closed Wide  (BOA);Foam/compliant surface;30 secs;3 reps    Standing Eyes Closed Limitations completed with eyes closed on airex.    Stepping Strategy Lateral;Anterior;UE support;Foam/compliant surface    Stepping Strategy Limitations standing on airex with black beams laterally completed side stepping x 10 reps each over beam, intermittent UE support required initially and close CGA from PT. Then completed fwd steps over and back over beam, increased challenge stepping back onto foam completed x 10 reps bilaterally.    Step Ups Forward;6 inch;Intermittent UE support;UE support 2;Limitations    Step Ups Limitations standing on airex completed alteranting steps forward to 6" steps x 10 reps leading with each LE. intermittent UE support required.               PT Short Term Goals - 09/02/20 1712      PT SHORT TERM GOAL #1   Title Pt will be independent with initial HEP and verbalize understanding of decr fall risk in the home. ALL STGS DUE 09/24/20    Time 4    Period Weeks    Status New    Target Date 09/24/20      PT SHORT TERM GOAL #2   Title Pt will improve BERG to at least a 49/56 in order to demo decr fall risk.    Baseline BERG 44/56    Time 4    Period Weeks    Status Revised      PT SHORT TERM GOAL #3   Title Pt will perform 5x sit <> stand in 20 seconds or less with single UE support in order to demo incr balance.    Baseline 25 seconds    Time 4    Period Weeks    Status New      PT SHORT TERM GOAL #4   Title Pt will improve gait speed with RW vs. LRAD to at least 2.4 ft/sec in order to demo improved community mobility.    Baseline 2.00 ft/sec    Time 4    Period Weeks    Status New      PT SHORT TERM GOAL #5   Title Pt will perform TUG with no AD in 17 seconds or less in order to demo decr fall risk.    Baseline 20.63 seconds    Time 4    Period Weeks    Status New             PT Long Term Goals - 08/27/20 1516      PT LONG TERM GOAL #1   Title Pt will be  independent with final HEP in order to build upon functional gains made in therapy.  ALL LTGS DUE 10/22/20    Time 8    Period Weeks    Status New    Target Date 10/22/20      PT LONG TERM GOAL #2   Title BERG/DGI goal to be written as appropriate.    Time 8    Period Weeks    Status New      PT LONG TERM GOAL #3   Title Pt will improve gait speed with LRAD vs. no AD to at least 2.7 ft/sec in order to demo improved community mobility.    Baseline 2.00 ft/sec    Time 8    Period Weeks    Status New      PT LONG TERM GOAL #4   Title Pt will perform TUG with no AD in 13.5 seconds or less in order to demo decr fall risk.    Baseline 20.63 seconds    Time 8    Period Weeks    Status New      PT LONG TERM GOAL #5   Title Pt will ambulate at least 500' with supervision over indoor/outdoor surfaces with LRAD in order to demo improved community mobility.    Time 8    Period Weeks    Status New      Additional Long Term Goals   Additional Long Term Goals Yes      PT LONG TERM GOAL #6   Title Pt will ambulate 12 steps with single handrail and step to vs. step through pattern with supervision in order to safely go up/down the stairs in his house.    Baseline 4 steps B handrails step through with CGA    Time 8    Period Weeks    Status New                 Plan - 09/17/20 1156    Clinical Impression Statement Today's skilled PT session focused on continued gait training with SPC with quad tip during therapy session. Assessed gait speed with AD, with patient ambulating at 1.39ft/sec. Continued activities working toward improved balance with patient tolerating well with CGA required intermittently. Will continue to benefit from skilled PT services and progress toward all LTGs.    Personal Factors and Comorbidities Comorbidity 3+;Past/Current Experience    Comorbidities L MCA CVA, Coronary Artery Disease, Heart Failure, Hyperlipidemia, Osteoarthritis, GERD, Renal Cell Cancer  requiring nephrectomy (active)    Examination-Activity Limitations Locomotion Level;Stand;Transfers;Stairs    Examination-Participation Restrictions Community Activity;Occupation   work (is a Customer service manager)   [employer] Stable/Uncomplicated    Rehab Potential Good    PT Frequency 2x / week    PT Duration 8 weeks    PT Treatment/Interventions ADLs/Self Care Home Management;Stair training;Gait training;DME Instruction;Functional mobility training;Therapeutic activities;Therapeutic exercise;Balance training;Neuromuscular re-education;Patient/family education;Orthotic Fit/Training;Passive range of motion;Vestibular;Energy conservation    PT Next Visit Plan I check gait speed but it needs to be rechecked due to patient ambulating slower with AD. check STGs. continue gait training with [place] w/ quad tip - obstacle negotiation. balance on compliant surfaces. RLE strength. Scifit for aerobic warm up    PT Home Exercise Plan added eccentric sitting to HEP, 5 reps per set    Consulted and Agree with Plan of Care Patient;Family member/caregiver    Family Member Consulted wife, [name]           Patient will benefit from skilled therapeutic intervention in order to improve the following deficits and impairments:  Abnormal gait,Decreased activity tolerance,Decreased balance,Decreased  endurance,Decreased knowledge of use of DME,Decreased range of motion,Decreased strength,Difficulty walking,Decreased safety awareness  Visit Diagnosis: Muscle weakness (generalized)  Unsteadiness on feet  Other abnormalities of gait and mobility     Problem List Patient Active Problem List   Diagnosis Date Noted  . Acute on chronic combined systolic and diastolic CHF (congestive heart failure) ([place]) 07/17/2020  . Renal cell carcinoma ([place]) 07/17/2020  . Protein-calorie malnutrition, severe 07/08/2020  . Acute respiratory failure with hypoxia ([place]) 07/05/2020  . Iron deficiency anemia due to chronic  blood loss 06/19/2020  . OSA (obstructive sleep apnea) 01/09/2018  . Pulmonary nodule, left 03/15/2017  . Former smoker 11/04/2014  . Insomnia 05/27/2014  . Plantar wart of left foot 11/27/2010  . Type 2 diabetes mellitus with other specified complication ([place]) 07/68/0881  . PSA, INCREASED 09/15/2009  . Diverticulitis of colon 05/08/2009  . Asthma, moderate persistent 06/19/2008  . ANKLE PAIN, CHRONIC 06/05/2007  . Allergic rhinitis 05/17/2007  . ERECTILE DYSFUNCTION, SECONDARY TO MEDICATION 05/17/2007  . Hyperlipidemia 04/06/2007  . Anxiety state 04/06/2007  . Essential hypertension 04/06/2007  . Coronary artery disease involving native coronary artery of native heart without angina pectoris 04/06/2007    Jones Bales, PT, DPT 09/17/2020, 11:58 AM  [facility] [address] [place], [place], [address] Phone: [phone]   Fax:  [phone]  Name: DEMARIUS ARCHILA MRN: [id number] Date of Birth: [birth year]

## 2020-09-22 ENCOUNTER — Ambulatory Visit: Payer: BC Managed Care – PPO | Admitting: Occupational Therapy

## 2020-09-22 ENCOUNTER — Other Ambulatory Visit: Payer: Self-pay

## 2020-09-22 ENCOUNTER — Encounter: Payer: Self-pay | Admitting: Speech Pathology

## 2020-09-22 ENCOUNTER — Encounter: Payer: Self-pay | Admitting: Occupational Therapy

## 2020-09-22 ENCOUNTER — Encounter: Payer: Self-pay | Admitting: Physical Therapy

## 2020-09-22 ENCOUNTER — Ambulatory Visit: Payer: BC Managed Care – PPO | Admitting: Physical Therapy

## 2020-09-22 ENCOUNTER — Ambulatory Visit: Payer: BC Managed Care – PPO | Admitting: Speech Pathology

## 2020-09-22 DIAGNOSIS — R41840 Attention and concentration deficit: Secondary | ICD-10-CM

## 2020-09-22 DIAGNOSIS — R4701 Aphasia: Secondary | ICD-10-CM

## 2020-09-22 DIAGNOSIS — R278 Other lack of coordination: Secondary | ICD-10-CM

## 2020-09-22 DIAGNOSIS — R2681 Unsteadiness on feet: Secondary | ICD-10-CM

## 2020-09-22 DIAGNOSIS — M6281 Muscle weakness (generalized): Secondary | ICD-10-CM | POA: Diagnosis not present

## 2020-09-22 DIAGNOSIS — I69854 Hemiplegia and hemiparesis following other cerebrovascular disease affecting left non-dominant side: Secondary | ICD-10-CM

## 2020-09-22 DIAGNOSIS — R41844 Frontal lobe and executive function deficit: Secondary | ICD-10-CM

## 2020-09-22 DIAGNOSIS — Z8673 Personal history of transient ischemic attack (TIA), and cerebral infarction without residual deficits: Secondary | ICD-10-CM

## 2020-09-22 MED ORDER — OZEMPIC (0.25 OR 0.5 MG/DOSE) 2 MG/1.5ML ~~LOC~~ SOPN: PEN_INJECTOR | SUBCUTANEOUS | 3 refills | Status: AC

## 2020-09-22 MED ORDER — SACUBITRIL-VALSARTAN 24-26 MG PO TABS: 1.0000 | ORAL_TABLET | Freq: Two times a day (BID) | ORAL | 2 refills | Status: DC

## 2020-09-22 NOTE — Therapy (Signed)
[name] [address] [place], [place], [address] Phone: [phone]   Fax:  [phone]  Speech Language Pathology Treatment  Patient Details  Name: Larry Garner MRN: [id number] Date of Birth: 30-Oct-1955 Referring Provider (SLP): Larry Garner, Lena   Encounter Date: 09/22/2020   End of Session - 09/22/20 1502    Visit Number 2    Number of Visits 17    Date for SLP Re-Evaluation 12/05/20    SLP Start Time 71    SLP Stop Time  1448    SLP Time Calculation (min) 46 min    Activity Tolerance Patient tolerated treatment well           Past Medical History:  Diagnosis Date  . Allergic rhinitis   . Allergy   . Anxiety   . Asthma   . CHF (congestive heart failure) ([place])   . Diabetes ([place])   . Diverticulitis   . Dyspnea   . GERD (gastroesophageal reflux disease)    very occ  . History of heart attack   . HLD (hyperlipidemia)   . HTN (hypertension)   . Myocardial infarction ([place]) 2000  . Sleep apnea    wears cpap   . Tubular adenoma of colon 09/2008    Past Surgical History:  Procedure Laterality Date  . CARDIAC CATHETERIZATION N/A 07/20/2016   Procedure: Left Heart Cath and Cors/Grafts Angiography;  Surgeon: Larry Crome, MD;  Location: [facility];  Service: Cardiovascular;  Laterality: N/A;  . CATARACT EXTRACTION Bilateral 06/06/2019   Dr. Tommy Garner. oct left, dec right 2020   . COLONOSCOPY    . CORONARY ANGIOPLASTY    . CORONARY ARTERY BYPASS GRAFT  06/1999  . POLYPECTOMY    . RENAL BIOPSY  06/2020  . TONSILLECTOMY     late50's early 60's    There were no vitals filed for this visit.   Subjective Assessment - 09/22/20 1405    Subjective "It's coming back in pieces" re: vocabulary    Patient is accompained by: Family member   wife - Larry Garner   Currently in Pain? No/denies                 ADULT SLP TREATMENT - 09/22/20 1406      General Information   Behavior/Cognition  Alert;Cooperative;Pleasant mood      Treatment Provided   Treatment provided Cognitive-Linquistic      Cognitive-Linquistic Treatment   Treatment focused on Aphasia;Patient/family/caregiver education    Skilled Treatment Pt reports improved memory and cognition. He is cooking meals and completing house hold chores successfully, and managing calendar/dates has improved. High level abstract naming with specified letter - pt required rare min A. In conversation, Maude required cues to describe as compensation for word finding. Provided structured task for [facility] to practice verbal compensations for anomia. Vishnu is texting and managing emails with min A from [place] / [place] / [place] with current plan of care      Progression Toward Goals   Progression toward goals Progressing toward goals            SLP Education - 09/22/20 1500    Education Details language activitites at home    Person(s) Educated Patient;Spouse    Methods Explanation;Demonstration;Verbal cues;Handout    Comprehension Verbalized understanding;Returned demonstration;Verbal cues required;Need further instruction            SLP Short Term Goals -  09/22/20 1501      SLP SHORT TERM GOAL #1   Title pt will name 8 items in abstract categories in 5/6 opportunities in 3 sessions    Time 4    Period Weeks    Status On-going      SLP SHORT TERM GOAL #2   Title pt will describe to SLP 3 anomia compensations    Time 4    Period Weeks    Status On-going      SLP SHORT TERM GOAL #3   Title pt will verbally sequence pertinent routines for pt with modified independence in 3 sessions    Time 4    Period Weeks    Status On-going      SLP SHORT TERM GOAL #4   Title pt will participate functionally in 10 minutes mod complex conversation using compensations for anomia successfully    Time 4    Period Weeks    Status On-going      SLP SHORT TERM GOAL #5   Title pt will participate in  further testing as necessary (WAB-R or if pt desires, CLQT)    Time 2    Period Weeks   or 5 total sessions   Status On-going            SLP Long Term Goals - 09/22/20 1501      SLP LONG TERM GOAL #1   Title pt will participate functionally in 20 minutes mod complex/complex conversation using compensations for anomia successfully in 3 sessions    Time 8    Period Weeks   or 17 total sessions, for all LTGs   Status On-going      SLP LONG TERM GOAL #2   Title pt will express numbers with 100% success with self correction in 3 sessions    Time 8    Period Weeks    Status On-going      SLP LONG TERM GOAL #3   Title pt will listen to a 20 minute discourse (lecture, TED talk, etc) and functionally give a verbal synopsis with compensations (notes, anomia compensations, etc) in 3 sessions    Time 8    Period Weeks    Status On-going            Plan - 09/22/20 1500    Clinical Impression Statement Nicholaus presents today with reduced word finding ability as reported and observed today in evaluation, his [employer] score is only slightly above the mean. 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 Pt also with reported attentional deficits described as inability to pay attention to long discourse now compared to pre-morbidly. Additionally pt states he may struggle with managin his own medication at this time, indicating reduced organization/critical thinking skills. Pt will be administered the CLQT if he desires in his first therapy session. SLP believes pt would benefit from skilled ST to target expressive aphasia and if pt desires, cognitive communication/attention skills. He and wife deny swallowing difficulties during meals.    Speech Therapy Frequency 2x / week    Duration 8 weeks   17 visits   Treatment/Interventions Language facilitation;Cueing hierarchy;SLP instruction  and feedback;Compensatory strategies;Patient/family education;Internal/external aids;Cognitive reorganization;Multimodal communcation approach;Functional tasks    Potential to Achieve Goals Good    Consulted and Agree with Plan of Care Patient  Patient will benefit from skilled therapeutic intervention in order to improve the following deficits and impairments:   Aphasia    Problem List Patient Active Problem List   Diagnosis Date Noted  . Acute on chronic combined systolic and diastolic CHF (congestive heart failure) ([place]) 07/17/2020  . Renal cell carcinoma ([place]) 07/17/2020  . Protein-calorie malnutrition, severe 07/08/2020  . Acute respiratory failure with hypoxia ([place]) 07/05/2020  . Iron deficiency anemia due to chronic blood loss 06/19/2020  . OSA (obstructive sleep apnea) 01/09/2018  . Pulmonary nodule, left 03/15/2017  . Former smoker 11/04/2014  . Insomnia 05/27/2014  . Plantar wart of left foot 11/27/2010  . Type 2 diabetes mellitus with other specified complication ([facility]) 68/07/7516  . PSA, INCREASED 09/15/2009  . Diverticulitis of colon 05/08/2009  . Asthma, moderate persistent 06/19/2008  . ANKLE PAIN, CHRONIC 06/05/2007  . Allergic rhinitis 05/17/2007  . ERECTILE DYSFUNCTION, SECONDARY TO MEDICATION 05/17/2007  . Hyperlipidemia 04/06/2007  . Anxiety state 04/06/2007  . Essential hypertension 04/06/2007  . Coronary artery disease involving native coronary artery of native heart without angina pectoris 04/06/2007    Rumaisa Schnetzer, Annye Rusk MS, CCC-SLP 09/22/2020, 3:02 PM  [facility] [address] [place], [place], [address] Phone: [phone]   Fax:  [phone]   Name: Larry Garner MRN: [id number] Date of Birth: [birth year]

## 2020-09-22 NOTE — Therapy (Signed)
Larry Garner [address] [place], [place], [address] Phone: [phone]   Fax:  [phone]  Physical Therapy Treatment  Patient Details  Name: Larry Garner MRN: [id number] Date of Birth: February 21, 1956 Referring Provider (PT): Redmond, Bea Laura, NP   Encounter Date: 09/22/2020   PT End of Session - 09/22/20 1643    Visit Number 8    Number of Visits 17    Date for PT Re-Evaluation 11/25/20    Authorization Type BCBS, Medicare    PT Start Time 1318    PT Stop Time 1358    PT Time Calculation (min) 40 min    Equipment Utilized During Treatment Gait belt    Activity Tolerance Patient tolerated treatment well    Behavior During Therapy [facility] for tasks assessed/performed           Past Medical History:  Diagnosis Date  . Allergic rhinitis   . Allergy   . Anxiety   . Asthma   . CHF (congestive heart failure) ([name])   . Diabetes ([place])   . Diverticulitis   . Dyspnea   . GERD (gastroesophageal reflux disease)    very occ  . History of heart attack   . HLD (hyperlipidemia)   . HTN (hypertension)   . Myocardial infarction ([place]) 2000  . Sleep apnea    wears cpap   . Tubular adenoma of colon 09/2008    Past Surgical History:  Procedure Laterality Date  . CARDIAC CATHETERIZATION N/A 07/20/2016   Procedure: Left Heart Cath and Cors/Grafts Angiography;  Surgeon: Belva Crome, MD;  Location: [facility];  Service: Cardiovascular;  Laterality: N/A;  . CATARACT EXTRACTION Bilateral 06/06/2019   Dr. Tommy Rainwater. oct left, dec right 2020   . COLONOSCOPY    . CORONARY ANGIOPLASTY    . CORONARY ARTERY BYPASS GRAFT  06/1999  . POLYPECTOMY    . RENAL BIOPSY  06/2020  . TONSILLECTOMY     late50's early 60's    There were no vitals filed for this visit.   Subjective Assessment - 09/22/20 1321    Subjective No changes since he was last here. Walking around with the cane at home, no falls. Sit to stands have become  easier and the balance exercises are getting better. Reports feeling more short of breath the past couple of days.    Patient is accompained by: Family member   beth   Pertinent History L MCA CVA, Coronary Artery Disease, Heart Failure, Hyperlipidemia, Osteoarthritis, GERD, Renal Cell Cancer requiring nephrectomy (active)    Limitations Standing;Walking;Other (comment)   stairs   How long can you walk comfortably? approx. 100' with RW    Patient Stated Goals go up and down the stairs by himself, find out what his new normal will be, walk without the RW    Currently in Pain? No/denies              [facility] PT Assessment - 09/22/20 1325      Berg Balance Test   Sit to Stand Able to stand  independently using hands    Standing Unsupported Able to stand safely 2 minutes    Sitting with Back Unsupported but Feet Supported on Floor or Stool Able to sit safely and securely 2 minutes    Stand to Sit Sits safely with minimal use of hands    Transfers Able to transfer safely, minor use of hands    Standing Unsupported with Eyes Closed Able to  stand 10 seconds safely    Standing Unsupported with Feet Together Able to place feet together independently and stand 1 minute safely    From Standing, Reach Forward with Outstretched Arm Can reach confidently >25 cm (10")    From Standing Position, Pick up Object from Floor Able to pick up shoe safely and easily    From Standing Position, Turn to Look Behind Over each Shoulder Looks behind from both sides and weight shifts well    Turn 360 Degrees Able to turn 360 degrees safely one side only in 4 seconds or less   3.25 to R, 4.4 to L   Standing Unsupported, Alternately Place Feet on Step/Stool Able to stand independently and safely and complete 8 steps in 20 seconds    Standing Unsupported, One Foot in Front Able to plae foot ahead of the other independently and hold 30 seconds    Standing on One Leg Tries to lift leg/unable to hold 3 seconds but remains  standing independently    Total Score 50    Berg comment: 50/56   moderate fall risk                        [facility] Adult PT Treatment/Exercise - 09/22/20 1325      Transfers   Transfers Sit to Stand;Stand to Sit    Sit to Stand 5: Supervision    Five time sit to stand comments  24.78 with BUE support from arm chair, 4/10 RPE afterwards    Stand to Sit 5: Supervision    Comments from 22" mat table: 2 x 5 reps sit <> stands without UE support, pt with incr difficulty at rep 5      Ambulation/Gait   Ambulation/Gait Yes    Ambulation/Gait Assistance 4: Min guard;5: Supervision    Ambulation/Gait Assistance Details between activities with SPC with quad tip    Assistive device Straight cane   with quad tip   Gait Pattern Step-through pattern    Ambulation Surface Level;Indoor    Stairs Yes    Stairs Assistance 4: Min guard;5: Supervision    Stairs Assistance Details (indicate cue type and reason) at first pt having difficulty recalling which hand to use cane in and needing reminder cues from wife at which side the railing is on at home, using cane in LUE cued for step to pattern with cane placement and leading with LLE and descending with RLE. pt initially trying to perform the opposite way but improved with incr reps, discussed not performing stairs by himself at home and having wife present to help with sequencing as needed    Stair Management Technique One rail Right;Step to pattern;Backwards;With cane    Number of Stairs 4   x4 reps = 16 total   Height of Stairs 6                  PT Education - 09/22/20 1643    Education Details stair training with cane, progress towards goals    Person(s) Educated Patient;Spouse    Methods Explanation;Demonstration;Verbal cues    Comprehension Verbalized understanding;Returned demonstration            PT Short Term Goals - 09/22/20 1327      PT SHORT TERM GOAL #1   Title Pt will be independent with initial HEP and  verbalize understanding of decr fall risk in the home. ALL STGS DUE 09/24/20    Time 4    Period  Weeks    Status New    Target Date 09/24/20      PT SHORT TERM GOAL #2   Title Pt will improve BERG to at least a 49/56 in order to demo decr fall risk.    Baseline BERG 44/56, 50/56 on 09/22/20    Time 4    Period Weeks    Status Achieved      PT SHORT TERM GOAL #3   Title Pt will perform 5x sit <> stand in 20 seconds or less with single UE support in order to demo incr balance.    Baseline 25 seconds, 24.78 with BUE support from arm chair, 4/10 RPE afterwards on 09/22/20    Time 4    Period Weeks    Status Not Met      PT SHORT TERM GOAL #4   Title Pt will improve gait speed with RW vs. LRAD to at least 2.4 ft/sec in order to demo improved community mobility.    Baseline 2.00 ft/sec    Time 4    Period Weeks    Status New      PT SHORT TERM GOAL #5   Title Pt will perform TUG with no AD in 17 seconds or less in order to demo decr fall risk.    Baseline 20.63 seconds, 17.5 seconds with SPC with quad tip on 09/22/20    Time 4    Period Weeks    Status Not Met             PT Long Term Goals - 08/27/20 1516      PT LONG TERM GOAL #1   Title Pt will be independent with final HEP in order to build upon functional gains made in therapy. ALL LTGS DUE 10/22/20    Time 8    Period Weeks    Status New    Target Date 10/22/20      PT LONG TERM GOAL #2   Title BERG/DGI goal to be written as appropriate.    Time 8    Period Weeks    Status New      PT LONG TERM GOAL #3   Title Pt will improve gait speed with LRAD vs. no AD to at least 2.7 ft/sec in order to demo improved community mobility.    Baseline 2.00 ft/sec    Time 8    Period Weeks    Status New      PT LONG TERM GOAL #4   Title Pt will perform TUG with no AD in 13.5 seconds or less in order to demo decr fall risk.    Baseline 20.63 seconds    Time 8    Period Weeks    Status New      PT LONG TERM GOAL #5    Title Pt will ambulate at least 500' with supervision over indoor/outdoor surfaces with LRAD in order to demo improved community mobility.    Time 8    Period Weeks    Status New      Additional Long Term Goals   Additional Long Term Goals Yes      PT LONG TERM GOAL #6   Title Pt will ambulate 12 steps with single handrail and step to vs. step through pattern with supervision in order to safely go up/down the stairs in his house.    Baseline 4 steps B handrails step through with CGA    Time 8    Period Weeks  Status New                 Plan - 09/22/20 1647    Clinical Impression Statement Began to assess STGs today with pt not meeting LTG #3 and #5. Pt improved TUG with SPC with quad tip to 17.5 seconds, but not to goal level (previously 20.63 seconds). Pt performed 5x sit <> stand today in 24.78 seconds with BUE support (previously 25 seconds) although pt reporting improved ease of transfer. Pt with improvement of BERG to a 50/56 today (previously 44/56). Pt reporting he has felt a little more short of breath the past couple of days, assessed pt's O2 andat 100% throughout session with HR between 75-80 bpm. Discussed that if SOB worsens/remains then to make pt's doctor aware.  Pt is progressing well, will continue to progress towards LTGs.    Personal Factors and Comorbidities Comorbidity 3+;Past/Current Experience    Comorbidities L MCA CVA, Coronary Artery Disease, Heart Failure, Hyperlipidemia, Osteoarthritis, GERD, Renal Cell Cancer requiring nephrectomy (active)    Examination-Activity Limitations Locomotion Level;Stand;Transfers;Stairs    Examination-Participation Restrictions Community Activity;Occupation   work (is a Customer service manager)   [employer] Stable/Uncomplicated    Rehab Potential Good    PT Frequency 2x / week    PT Duration 8 weeks    PT Treatment/Interventions ADLs/Self Care Home Management;Stair training;Gait training;DME Instruction;Functional  mobility training;Therapeutic activities;Therapeutic exercise;Balance training;Neuromuscular re-education;Patient/family education;Orthotic Fit/Training;Passive range of motion;Vestibular;Energy conservation    PT Next Visit Plan check remainder of STGs - and upgrade HEP for higher level balance/strengthening. continue gait training with [place] w/ quad tip - obstacle negotiation. balance on compliant surfaces. RLE strength. 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 Acute on chronic combined systolic and diastolic CHF (congestive heart failure) ([place]) 07/17/2020  . Renal cell carcinoma ([place]) 07/17/2020  . Protein-calorie malnutrition, severe 07/08/2020  . Acute respiratory failure with hypoxia ([place]) 07/05/2020  . Iron deficiency anemia due to chronic blood loss 06/19/2020  . OSA (obstructive sleep apnea) 01/09/2018  . Pulmonary nodule, left 03/15/2017  . Former smoker 11/04/2014  . Insomnia 05/27/2014  . Plantar wart of left foot 11/27/2010  . Type 2 diabetes mellitus with other specified complication ([place]) 70/48/8891  . PSA, INCREASED 09/15/2009  . Diverticulitis of colon 05/08/2009  . Asthma, moderate persistent  06/19/2008  . ANKLE PAIN, CHRONIC 06/05/2007  . Allergic rhinitis 05/17/2007  . ERECTILE DYSFUNCTION, SECONDARY TO MEDICATION 05/17/2007  . Hyperlipidemia 04/06/2007  . Anxiety state 04/06/2007  . Essential hypertension 04/06/2007  . Coronary artery disease involving native coronary artery of native heart without angina pectoris 04/06/2007    Arliss Journey, PT, DPT  09/22/2020, 4:48 PM  [facility] [address] [place], [place], [address] Phone: [phone]  Fax:  [phone]  Name: Larry Garner MRN: [id number] Date of Birth: Apr 09, 1956

## 2020-09-22 NOTE — Therapy (Signed)
[facility] [address] [place], [place], [address] Phone: [phone]   Fax:  [phone]  Occupational Therapy Treatment  Patient Details  Name: Larry Garner MRN: [id number] Date of Birth: 1955/12/14 Referring Provider (OT): Bea Laura Redmond   Encounter Date: 09/22/2020   OT End of Session - 09/22/20 1233    Visit Number 8    Number of Visits 13    Date for OT Re-Evaluation 09/26/20    Authorization Type BCBS and Medicare    Authorization Time Period 10th visit progress note    Authorization - Visit Number 8    Authorization - Number of Visits 10    Progress Note Due on Visit 10    OT Start Time 1233    OT Stop Time 1315    OT Time Calculation (min) 42 min    Activity Tolerance Patient tolerated treatment well    Behavior During Therapy [facility] for tasks assessed/performed           Past Medical History:  Diagnosis Date  . Allergic rhinitis   . Allergy   . Anxiety   . Asthma   . CHF (congestive heart failure) ([place])   . Diabetes ([place])   . Diverticulitis   . Dyspnea   . GERD (gastroesophageal reflux disease)    very occ  . History of heart attack   . HLD (hyperlipidemia)   . HTN (hypertension)   . Myocardial infarction ([place]) 2000  . Sleep apnea    wears cpap   . Tubular adenoma of colon 09/2008    Past Surgical History:  Procedure Laterality Date  . CARDIAC CATHETERIZATION N/A 07/20/2016   Procedure: Left Heart Cath and Cors/Grafts Angiography;  Surgeon: Belva Crome, MD;  Location: [facility];  Service: Cardiovascular;  Laterality: N/A;  . CATARACT EXTRACTION Bilateral 06/06/2019   Dr. Tommy Rainwater. oct left, dec right 2020   . COLONOSCOPY    . CORONARY ANGIOPLASTY    . CORONARY ARTERY BYPASS GRAFT  06/1999  . POLYPECTOMY    . RENAL BIOPSY  06/2020  . TONSILLECTOMY     late50's early 60's    There were no vitals filed for this visit.   Subjective Assessment - 09/22/20 1233     Subjective  Pt denies any pain and says no changes    Patient is accompanied by: Family member    Limitations active renal cell cancer. fall risk. no driving    Patient Stated Goals I have trouble with dates and months and ordinal things    Currently in Pain? No/denies                        OT Treatments/Exercises (OP) - 09/22/20 1247      Cognitive Exercises   Attention Span Alternating Constant Therapy alternating symbols with 95% accuracy and 69.32s response time. Difficulty with recall of what symbol on    Basic Math Everyday math Level 1 on Constant Therapy 70% accuracy and 86.52s response time. Pt required increased time and moderate cues at times for initiation of problem solving. Pt with cues occassionally on finding items (i.e. calculator) that were already established.      Hand Exercises   Other Hand Exercises Hand Gripper level 2 with LUE with better time and little difficulty this day                    OT Short Term Goals -  09/10/20 1210      OT SHORT TERM GOAL #1   Title Pt will be independent with HEP 09/05/20    Time 3    Period Weeks    Status Achieved    Target Date 09/05/20      OT SHORT TERM GOAL #2   Title Pt will increase grip strength in LUE to 42 lbs or more for increase in functional use of LUE.    Baseline RUE 52.6 LUE 38.8    Time 3    Period Weeks    Status On-going   47 right, 35 left 09/10/20     OT SHORT TERM GOAL #3   Title Pt will perform simple warm meal prep with supervision for return to prior level of functioning    Time 3    Period Weeks    Status Achieved      OT SHORT TERM GOAL #4   Title Pt will increase standing tolerance to 10 minutes or greater for increasing overall endurance and activity tolerance    Time 3    Period Weeks    Status Achieved             OT Long Term Goals - 09/22/20 1237      OT LONG TERM GOAL #1   Title Pt will be independent with updated HEP 09/26/20    Time 6    Period  Weeks    Status On-going      OT LONG TERM GOAL #2   Title Pt will increase grip strength in LUE to 48 lbs or greater for increase in overall functional use of LUE    Baseline RUE 52.6 LUE 38.8    Time 6    Period Weeks    Status On-going      OT LONG TERM GOAL #3   Title Pt will complete 9 hole peg test in 32 seconds or less with LUE for increase in fine motor coordination.    Baseline RUE 28.69, LUE 37.03    Time 6    Period Weeks    Status Achieved   24.34     OT LONG TERM GOAL #4   Title Pt will complete mod complex meal prep with mod I for increase in return to prior level of functioning and home management and meal prep tasks    Time 6    Period Weeks    Status Achieved   completed at home with increased time.     OT LONG TERM GOAL #5   Title Pt will tolerate 15 minutes of continuous standing activities for increase in overall standing tolerance and activity tolerance for completing IADLs.    Time 6    Period Weeks    Status Achieved   pt is completing standing for > 15 minutes with cooking at home etc     OT LONG TERM GOAL #6   Title Pt will complete physical and cognitive task simultaneously with 90% accuracy.    Time 6    Period Weeks    Status On-going   75-80% with diff continuing walking with thinking     OT LONG TERM GOAL #7   Title Complete FOTO at discharge    Time 6    Period Weeks    Status On-going                 Plan - 09/22/20 1409    Clinical Impression Statement Pt continues to demonstrate deficits with overall cognition  with memory and problem solving and alternating attention this day.    OT Occupational Profile and History Problem Focused Assessment - Including review of records relating to presenting problem    Occupational performance deficits (Please refer to evaluation for details): IADL's;ADL's;Leisure    Body Structure / Function / Physical Skills ADL;Decreased knowledge of use of DME;Strength;GMC;Balance;UE functional  use;IADL;ROM;Endurance;FMC;Coordination    Rehab Potential Good    Clinical Decision Making Limited treatment options, no task modification necessary    Comorbidities Affecting Occupational Performance: None    OT Frequency 2x / week    OT Duration 6 weeks    OT Treatment/Interventions Self-care/ADL training;DME and/or AE instruction;Therapeutic activities;Therapeutic exercise;Functional Mobility Training;Energy conservation;Patient/family education    Plan conditioning, memory, grip strength    OT Home Exercise Plan coordination, putty    Consulted and Agree with Plan of Care Patient;Family member/caregiver    Family Member Consulted spouse, [name]           Patient will benefit from skilled therapeutic intervention in order to improve the following deficits and impairments:   Body Structure / Function / Physical Skills: ADL,Decreased knowledge of use of DME,Strength,GMC,Balance,UE functional use,IADL,ROM,Endurance,FMC,Coordination       Visit Diagnosis: Muscle weakness (generalized)  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 Acute on chronic combined systolic and diastolic CHF (congestive heart failure) ([place]) 07/17/2020  . Renal cell carcinoma ([place]) 07/17/2020  . Protein-calorie malnutrition, severe 07/08/2020  . Acute respiratory failure with hypoxia ([place]) 07/05/2020  . Iron deficiency anemia due to chronic blood loss 06/19/2020  . OSA (obstructive sleep apnea) 01/09/2018  . Pulmonary nodule, left 03/15/2017  . Former smoker 11/04/2014  . Insomnia 05/27/2014  . Plantar wart of left foot 11/27/2010  . Type 2 diabetes mellitus with other specified complication ([place]) 41/32/4401  . PSA, INCREASED 09/15/2009   . Diverticulitis of colon 05/08/2009  . Asthma, moderate persistent 06/19/2008  . ANKLE PAIN, CHRONIC 06/05/2007  . Allergic rhinitis 05/17/2007  . ERECTILE DYSFUNCTION, SECONDARY TO MEDICATION 05/17/2007  . Hyperlipidemia 04/06/2007  . Anxiety state 04/06/2007  . Essential hypertension 04/06/2007  . Coronary artery disease involving native coronary artery of native heart without angina pectoris 04/06/2007    Zachery Conch MOT, OTR/L  09/22/2020, 2:09 PM  [facility] [address] [place], [place], [address] Phone: [phone]   Fax:  [phone]  Name: DILLYN MENNA MRN: [id number] Date of Birth: March 10, 1956

## 2020-09-23 ENCOUNTER — Ambulatory Visit: Payer: BC Managed Care – PPO | Attending: Internal Medicine

## 2020-09-23 NOTE — Progress Notes (Signed)
   Covid-19 Vaccination Clinic  Name:  Larry Garner    MRN: [id number] DOB: [birth year]  09/23/2020  [name] was observed post Covid-19 immunization for 15 minutes without incident. He was provided with Vaccine Information Sheet and instruction to access the V-Safe system.  Vaccinated by [facility]  Larry Garner was instructed to call 911 with any severe reactions post vaccine: Marland Kitchen Difficulty breathing  . Swelling of face and throat  . A fast heartbeat  . A bad rash all over body  . Dizziness and weakness

## 2020-09-24 ENCOUNTER — Ambulatory Visit: Payer: BC Managed Care – PPO

## 2020-09-24 ENCOUNTER — Other Ambulatory Visit: Payer: Self-pay

## 2020-09-24 ENCOUNTER — Encounter: Payer: Self-pay | Admitting: Occupational Therapy

## 2020-09-24 ENCOUNTER — Ambulatory Visit: Payer: BC Managed Care – PPO | Admitting: Occupational Therapy

## 2020-09-24 DIAGNOSIS — R41840 Attention and concentration deficit: Secondary | ICD-10-CM

## 2020-09-24 DIAGNOSIS — R41844 Frontal lobe and executive function deficit: Secondary | ICD-10-CM

## 2020-09-24 DIAGNOSIS — M6281 Muscle weakness (generalized): Secondary | ICD-10-CM | POA: Diagnosis not present

## 2020-09-24 DIAGNOSIS — R2689 Other abnormalities of gait and mobility: Secondary | ICD-10-CM

## 2020-09-24 DIAGNOSIS — I69854 Hemiplegia and hemiparesis following other cerebrovascular disease affecting left non-dominant side: Secondary | ICD-10-CM

## 2020-09-24 DIAGNOSIS — R2681 Unsteadiness on feet: Secondary | ICD-10-CM

## 2020-09-24 DIAGNOSIS — R278 Other lack of coordination: Secondary | ICD-10-CM

## 2020-09-24 NOTE — Therapy (Signed)
[facility] [address] [place], [place], [address] Phone: [phone]   Fax:  [phone]  Occupational Therapy Treatment  Patient Details  Name: Larry Garner MRN: [id number] Date of Birth: 1956-02-03 Referring Provider (OT): Bea Laura Redmond   Encounter Date: 09/24/2020   OT End of Session - 09/24/20 1019    Visit Number 9    Number of Visits 13    Date for OT Re-Evaluation 09/26/20    Authorization Type BCBS and Medicare    Authorization Time Period 10th visit progress note    Authorization - Visit Number 9    Authorization - Number of Visits 10    Progress Note Due on Visit 10    OT Start Time 1018    OT Stop Time 1100    OT Time Calculation (min) 42 min    Activity Tolerance Patient tolerated treatment well    Behavior During Therapy [facility] for tasks assessed/performed           Past Medical History:  Diagnosis Date  . Allergic rhinitis   . Allergy   . Anxiety   . Asthma   . CHF (congestive heart failure) ([place])   . Diabetes ([place])   . Diverticulitis   . Dyspnea   . GERD (gastroesophageal reflux disease)    very occ  . History of heart attack   . HLD (hyperlipidemia)   . HTN (hypertension)   . Myocardial infarction ([place]) 2000  . Sleep apnea    wears cpap   . Tubular adenoma of colon 09/2008    Past Surgical History:  Procedure Laterality Date  . CARDIAC CATHETERIZATION N/A 07/20/2016   Procedure: Left Heart Cath and Cors/Grafts Angiography;  Surgeon: Belva Crome, MD;  Location: [facility];  Service: Cardiovascular;  Laterality: N/A;  . CATARACT EXTRACTION Bilateral 06/06/2019   Dr. Tommy Rainwater. oct left, dec right 2020   . COLONOSCOPY    . CORONARY ANGIOPLASTY    . CORONARY ARTERY BYPASS GRAFT  06/1999  . POLYPECTOMY    . RENAL BIOPSY  06/2020  . TONSILLECTOMY     late50's early 60's    There were no vitals filed for this visit.   Subjective Assessment - 09/24/20 1019     Subjective  Pt denies any pain and says no changes to report    Patient is accompanied by: Family member    Limitations active renal cell cancer. fall risk. no driving    Patient Stated Goals I have trouble with dates and months and ordinal things    Currently in Pain? No/denies                        OT Treatments/Exercises (OP) - 09/24/20 1024      Cognitive Exercises   Attention Span Alternating tossing ball between hands and word finding/category generation foods A-Z with increased time and moderate difficulty and requiring moderate cues. pt completed in sitting.      Hand Exercises   Other Hand Exercises Hand gripper with level 3 in LUE for picking up 1 inch blocks      Fine Motor Coordination (Hand/Wrist)   Fine Motor Coordination In hand manipuation training    In Hand Manipulation Training small beads in hand and translating one peg at a time to fingertips with LUE to place into containers by color. Mod difficulty and mod drops  OT Short Term Goals - 09/10/20 1210      OT SHORT TERM GOAL #1   Title Pt will be independent with HEP 09/05/20    Time 3    Period Weeks    Status Achieved    Target Date 09/05/20      OT SHORT TERM GOAL #2   Title Pt will increase grip strength in LUE to 42 lbs or more for increase in functional use of LUE.    Baseline RUE 52.6 LUE 38.8    Time 3    Period Weeks    Status On-going   47 right, 35 left 09/10/20     OT SHORT TERM GOAL #3   Title Pt will perform simple warm meal prep with supervision for return to prior level of functioning    Time 3    Period Weeks    Status Achieved      OT SHORT TERM GOAL #4   Title Pt will increase standing tolerance to 10 minutes or greater for increasing overall endurance and activity tolerance    Time 3    Period Weeks    Status Achieved             OT Long Term Goals - 09/22/20 1237      OT LONG TERM GOAL #1   Title Pt will be independent with  updated HEP 09/26/20    Time 6    Period Weeks    Status On-going      OT LONG TERM GOAL #2   Title Pt will increase grip strength in LUE to 48 lbs or greater for increase in overall functional use of LUE    Baseline RUE 52.6 LUE 38.8    Time 6    Period Weeks    Status On-going      OT LONG TERM GOAL #3   Title Pt will complete 9 hole peg test in 32 seconds or less with LUE for increase in fine motor coordination.    Baseline RUE 28.69, LUE 37.03    Time 6    Period Weeks    Status Achieved   24.34     OT LONG TERM GOAL #4   Title Pt will complete mod complex meal prep with mod I for increase in return to prior level of functioning and home management and meal prep tasks    Time 6    Period Weeks    Status Achieved   completed at home with increased time.     OT LONG TERM GOAL #5   Title Pt will tolerate 15 minutes of continuous standing activities for increase in overall standing tolerance and activity tolerance for completing IADLs.    Time 6    Period Weeks    Status Achieved   pt is completing standing for > 15 minutes with cooking at home etc     OT LONG TERM GOAL #6   Title Pt will complete physical and cognitive task simultaneously with 90% accuracy.    Time 6    Period Weeks    Status On-going   75-80% with diff continuing walking with thinking     OT LONG TERM GOAL #7   Title Complete FOTO at discharge    Time 6    Period Weeks    Status On-going                 Plan - 09/24/20 1030    Clinical Impression Statement Pt has met  most goals at this time. Pt is progressing towards remaining goals but continues to have difficulty with dual tasking and grip strength.    OT Occupational Profile and History Problem Focused Assessment - Including review of records relating to presenting problem    Occupational performance deficits (Please refer to evaluation for details): IADL's;ADL's;Leisure    Body Structure / Function / Physical Skills ADL;Decreased knowledge  of use of DME;Strength;GMC;Balance;UE functional use;IADL;ROM;Endurance;FMC;Coordination    Rehab Potential Good    Clinical Decision Making Limited treatment options, no task modification necessary    Comorbidities Affecting Occupational Performance: None    OT Frequency 2x / week    OT Duration 6 weeks    OT Treatment/Interventions Self-care/ADL training;DME and/or AE instruction;Therapeutic activities;Therapeutic exercise;Functional Mobility Training;Energy conservation;Patient/family education    Plan 10th visit progress note next visit - check remaining goals, determine if need is there for more therapy? 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 Acute on chronic combined systolic and diastolic CHF (congestive heart failure) ([place]) 07/17/2020  . Renal cell carcinoma ([place]) 07/17/2020  . Protein-calorie malnutrition, severe 07/08/2020  . Acute respiratory failure with hypoxia ([place]) 07/05/2020  . Iron deficiency anemia due to chronic blood loss 06/19/2020  . OSA (obstructive sleep apnea) 01/09/2018  . Pulmonary nodule, left 03/15/2017  . Former smoker 11/04/2014  . Insomnia  05/27/2014  . Plantar wart of left foot 11/27/2010  . Type 2 diabetes mellitus with other specified complication ([place]) 95/63/8756  . PSA, INCREASED 09/15/2009  . Diverticulitis of colon 05/08/2009  . Asthma, moderate persistent 06/19/2008  . ANKLE PAIN, CHRONIC 06/05/2007  . Allergic rhinitis 05/17/2007  . ERECTILE DYSFUNCTION, SECONDARY TO MEDICATION 05/17/2007  . Hyperlipidemia 04/06/2007  . Anxiety state 04/06/2007  . Essential hypertension 04/06/2007  . Coronary artery disease involving native coronary artery of native heart without angina pectoris 04/06/2007    Zachery Conch MOT, OTR/L  09/24/2020, 2:11 PM  [facility] [address] [place], [place], [address] Phone: [phone]   Fax:  [phone]  Name: EUGEAN ARNOTT MRN: [id number] Date of Birth: 15-Mar-1956

## 2020-09-24 NOTE — Therapy (Signed)
[facility] [address] [place], [place], [address] Phone: [phone]   Fax:  [phone]  Physical Therapy Treatment  Patient Details  Name: Larry Garner MRN: [id number] Date of Birth: [birth year] Referring Provider (PT): Redmond, Bea Laura, NP   Encounter Date: 09/24/2020   PT End of Session - 09/24/20 1106    Visit Number 9    Number of Visits 17    Date for PT Re-Evaluation 11/25/20    Authorization Type BCBS, Medicare    PT Start Time 1102    PT Stop Time 1145    PT Time Calculation (min) 43 min    Equipment Utilized During Treatment Gait belt    Activity Tolerance Patient tolerated treatment well    Behavior During Therapy WFL for tasks assessed/performed           Past Medical History:  Diagnosis Date  . Allergic rhinitis   . Allergy   . Anxiety   . Asthma   . CHF (congestive heart failure) ([place])   . Diabetes ([place])   . Diverticulitis   . Dyspnea   . GERD (gastroesophageal reflux disease)    very occ  . History of heart attack   . HLD (hyperlipidemia)   . HTN (hypertension)   . Myocardial infarction ([place]) 2000  . Sleep apnea    wears cpap   . Tubular adenoma of colon 09/2008    Past Surgical History:  Procedure Laterality Date  . CARDIAC CATHETERIZATION N/A 07/20/2016   Procedure: Left Heart Cath and Cors/Grafts Angiography;  Surgeon: Belva Crome, MD;  Location: [facility];  Service: Cardiovascular;  Laterality: N/A;  . CATARACT EXTRACTION Bilateral 06/06/2019   Dr. Tommy Rainwater. oct left, dec right 2020   . COLONOSCOPY    . CORONARY ANGIOPLASTY    . CORONARY ARTERY BYPASS GRAFT  06/1999  . POLYPECTOMY    . RENAL BIOPSY  06/2020  . TONSILLECTOMY     late50's early 60's    There were no vitals filed for this visit.   Subjective Assessment - 09/24/20 1104    Subjective Patient reports no new changes complaints. Still reports feeling comfortable ambulating with SPC. No falls.     Patient is accompained by: Family member   beth   Pertinent History L MCA CVA, Coronary Artery Disease, Heart Failure, Hyperlipidemia, Osteoarthritis, GERD, Renal Cell Cancer requiring nephrectomy (active)    Limitations Standing;Walking;Other (comment)   stairs   How long can you walk comfortably? approx. 100' with RW    Patient Stated Goals go up and down the stairs by himself, find out what his new normal will be, walk without the RW    Currently in Pain? No/denies             [facility] Adult PT Treatment/Exercise - 09/24/20 1140      Transfers   Transfers Sit to Stand;Stand to Sit    Sit to Stand 5: Supervision    Stand to Sit 5: Supervision    Comments completd sit <> stands from mat with UE support to come to rise. From elevated mat table decreased UE support required. PT educating on trying to complete at home from elevated surface.      Ambulation/Gait   Ambulation/Gait Yes    Ambulation/Gait Assistance 4: Min guard;5: Supervision    Ambulation/Gait Assistance Details completed ambulation around therapy session with activities    Assistive device Straight cane    Gait Pattern Step-through pattern  Ambulation Surface Level;Indoor      Exercises   Exercises Other Exercises    Other Exercises  Completed entire review of current HEP and progressed as tolerated. See below/medbridge program for details.      Knee/Hip Exercises: Aerobic   Other Aerobic Completed SciFit on Level 2.0 x 5 minutes with BUE/BLE for improved endurance/activity tolerance. Patient tolerating increase in resistance well with completion.          Completed all of the following exercises as review/progression of current HEP:    Access Code: [facility] URL: [email or web address] Date: 09/24/2020 Prepared by: Baldomero Lamy  Exercises Sit to Stand with Armchair - 2 x daily - 5 x weekly - 2 sets - 5 reps - educated on completing from slightly elevated surface without UE support Alternating  Step Taps with Counter Support - 2 x daily - 5 x weekly - 1 sets - 10 reps - educated to complete at first step for higher height for increased balance challenge and SLS Side Stepping with Resistance at Thighs and Counter Support - 2 x daily - 5 x weekly - 3 sets - added red theraband Tandem Stance with Head Rotation - 1 x daily - 5 x weekly - 1 sets - 10 reps - educated to continue using UE support from countertop as needed  Staggered Stance Forward Backward Weight Shift with Counter Support - 1 x daily - 5 x weekly - 3 sets - 10 reps Seated Ankle Dorsiflexion with Resistance - 1 x daily - 5 x weekly - 2 sets - 10 reps Heel rises with counter support - 1 x daily - 5 x weekly - 1 sets - 10 reps      PT Education - 09/24/20 1156    Education Details Updated HEP    Person(s) Educated Patient;Spouse    Methods Explanation;Demonstration;Handout    Comprehension Verbalized understanding;Returned demonstration            PT Short Term Goals - 09/24/20 1157      PT SHORT TERM GOAL #1   Title Pt will be independent with initial HEP and verbalize understanding of decr fall risk in the home. ALL STGS DUE 09/24/20    Baseline Independent    Time 4    Period Weeks    Status Achieved    Target Date 09/24/20      PT SHORT TERM GOAL #2   Title Pt will improve BERG to at least a 49/56 in order to demo decr fall risk.    Baseline BERG 44/56, 50/56 on 09/22/20    Time 4    Period Weeks    Status Achieved      PT SHORT TERM GOAL #3   Title Pt will perform 5x sit <> stand in 20 seconds or less with single UE support in order to demo incr balance.    Baseline 25 seconds, 24.78 with BUE support from arm chair, 4/10 RPE afterwards on 09/22/20    Time 4    Period Weeks    Status Not Met      PT SHORT TERM GOAL #4   Title Pt will improve gait speed with RW vs. LRAD to at least 2.4 ft/sec in order to demo improved community mobility.    Baseline 2.00 ft/sec; 1.60 ft/sec with LRAD (SPC)    Time 4     Period Weeks    Status Not Met      PT SHORT TERM GOAL #5   Title Pt will  perform TUG with no AD in 17 seconds or less in order to demo decr fall risk.    Baseline 20.63 seconds, 17.5 seconds with SPC with quad tip on 09/22/20    Time 4    Period Weeks    Status Not Met             PT Long Term Goals - 08/27/20 1516      PT LONG TERM GOAL #1   Title Pt will be independent with final HEP in order to build upon functional gains made in therapy. ALL LTGS DUE 10/22/20    Time 8    Period Weeks    Status New    Target Date 10/22/20      PT LONG TERM GOAL #2   Title BERG/DGI goal to be written as appropriate.    Time 8    Period Weeks    Status New      PT LONG TERM GOAL #3   Title Pt will improve gait speed with LRAD vs. no AD to at least 2.7 ft/sec in order to demo improved community mobility.    Baseline 2.00 ft/sec    Time 8    Period Weeks    Status New      PT LONG TERM GOAL #4   Title Pt will perform TUG with no AD in 13.5 seconds or less in order to demo decr fall risk.    Baseline 20.63 seconds    Time 8    Period Weeks    Status New      PT LONG TERM GOAL #5   Title Pt will ambulate at least 500' with supervision over indoor/outdoor surfaces with LRAD in order to demo improved community mobility.    Time 8    Period Weeks    Status New      Additional Long Term Goals   Additional Long Term Goals Yes      PT LONG TERM GOAL #6   Title Pt will ambulate 12 steps with single handrail and step to vs. step through pattern with supervision in order to safely go up/down the stairs in his house.    Baseline 4 steps B handrails step through with CGA    Time 8    Period Weeks    Status New                 Plan - 09/24/20 1158    Clinical Impression Statement Today's skilled PT session included finishing assesment toward STG. Patient able to meet STG #1 today during session. Reviewed current HEP and progressed as tolerated by patient. Continue to require  intermittent rest breaks due to fatigue. Patient is making slow steady progress with PT services and will conitnue to benefit from skilled therapy to progress toward all LTG.    Personal Factors and Comorbidities Comorbidity 3+;Past/Current Experience    Comorbidities L MCA CVA, Coronary Artery Disease, Heart Failure, Hyperlipidemia, Osteoarthritis, GERD, Renal Cell Cancer requiring nephrectomy (active)    Examination-Activity Limitations Locomotion Level;Stand;Transfers;Stairs    Examination-Participation Restrictions Community Activity;Occupation   work (is a Customer service manager)   [employer] Stable/Uncomplicated    Rehab Potential Good    PT Frequency 2x / week    PT Duration 8 weeks    PT Treatment/Interventions ADLs/Self Care Home Management;Stair training;Gait training;DME Instruction;Functional mobility training;Therapeutic activities;Therapeutic exercise;Balance training;Neuromuscular re-education;Patient/family education;Orthotic Fit/Training;Passive range of motion;Vestibular;Energy conservation    PT Next Visit Plan How was HEP Update?. continue gait training with  SPC w/ quad tip - obstacle negotiation. balance on compliant surfaces. RLE strength. Scifit for aerobic warm up    PT Home Exercise Plan added eccentric sitting to HEP, 5 reps per set    Consulted and Agree with Plan of Care Patient;Family member/caregiver    Family Member Consulted wife, [name]           Patient will benefit from skilled therapeutic intervention in order to improve the following deficits and impairments:  Abnormal gait,Decreased activity tolerance,Decreased balance,Decreased endurance,Decreased knowledge of use of DME,Decreased range of motion,Decreased strength,Difficulty walking,Decreased safety awareness  Visit Diagnosis: Muscle weakness (generalized)  Unsteadiness on feet  Other abnormalities of gait and mobility     Problem List Patient Active Problem List   Diagnosis Date Noted   . Acute on chronic combined systolic and diastolic CHF (congestive heart failure) ([place]) 07/17/2020  . Renal cell carcinoma ([place]) 07/17/2020  . Protein-calorie malnutrition, severe 07/08/2020  . Acute respiratory failure with hypoxia ([place]) 07/05/2020  . Iron deficiency anemia due to chronic blood loss 06/19/2020  . OSA (obstructive sleep apnea) 01/09/2018  . Pulmonary nodule, left 03/15/2017  . Former smoker 11/04/2014  . Insomnia 05/27/2014  . Plantar wart of left foot 11/27/2010  . Type 2 diabetes mellitus with other specified complication ([place]) 97/18/2099  . PSA, INCREASED 09/15/2009  . Diverticulitis of colon 05/08/2009  . Asthma, moderate persistent 06/19/2008  . ANKLE PAIN, CHRONIC 06/05/2007  . Allergic rhinitis 05/17/2007  . ERECTILE DYSFUNCTION, SECONDARY TO MEDICATION 05/17/2007  . Hyperlipidemia 04/06/2007  . Anxiety state 04/06/2007  . Essential hypertension 04/06/2007  . Coronary artery disease involving native coronary artery of native heart without angina pectoris 04/06/2007    Jones Bales, PT, DPT 09/24/2020, 11:59 AM  [facility] [address] [place], [place], [address] Phone: [phone]   Fax:  [phone]  Name: Larry Garner MRN: [id number] Date of Birth: [birth year]

## 2020-09-24 NOTE — Patient Instructions (Signed)
Access Code: [facility] URL: [email or web address] Date: 09/24/2020 Prepared by: Baldomero Lamy  Exercises Sit to Stand with Armchair - 2 x daily - 5 x weekly - 2 sets - 5 reps Alternating Step Taps with Counter Support - 2 x daily - 5 x weekly - 1 sets - 10 reps Side Stepping with Resistance at Thighs and Counter Support - 2 x daily - 5 x weekly - 3 sets Tandem Stance with Head Rotation - 1 x daily - 5 x weekly - 1 sets - 10 reps Staggered Stance Forward Backward Weight Shift with Counter Support - 1 x daily - 5 x weekly - 3 sets - 10 reps Seated Ankle Dorsiflexion with Resistance - 1 x daily - 5 x weekly - 2 sets - 10 reps Heel rises with counter support - 1 x daily - 5 x weekly - 1 sets - 10 reps

## 2020-09-29 ENCOUNTER — Encounter: Payer: Self-pay | Admitting: Occupational Therapy

## 2020-09-29 ENCOUNTER — Ambulatory Visit: Payer: BC Managed Care – PPO | Admitting: Physical Therapy

## 2020-09-29 ENCOUNTER — Ambulatory Visit: Payer: BC Managed Care – PPO | Admitting: Occupational Therapy

## 2020-09-29 ENCOUNTER — Encounter: Payer: Self-pay | Admitting: Physical Therapy

## 2020-09-29 ENCOUNTER — Other Ambulatory Visit: Payer: Self-pay

## 2020-09-29 DIAGNOSIS — M6281 Muscle weakness (generalized): Secondary | ICD-10-CM

## 2020-09-29 DIAGNOSIS — R278 Other lack of coordination: Secondary | ICD-10-CM

## 2020-09-29 DIAGNOSIS — R41844 Frontal lobe and executive function deficit: Secondary | ICD-10-CM

## 2020-09-29 DIAGNOSIS — R2689 Other abnormalities of gait and mobility: Secondary | ICD-10-CM

## 2020-09-29 DIAGNOSIS — R41840 Attention and concentration deficit: Secondary | ICD-10-CM

## 2020-09-29 DIAGNOSIS — I69854 Hemiplegia and hemiparesis following other cerebrovascular disease affecting left non-dominant side: Secondary | ICD-10-CM

## 2020-09-29 DIAGNOSIS — R2681 Unsteadiness on feet: Secondary | ICD-10-CM

## 2020-09-29 NOTE — Therapy (Addendum)
[facility] [address] [place], [place], [address] Phone: [phone]   Fax:  [phone]  Physical Therapy Treatment/10th Visit Progress Note  Patient Details  Name: Larry Garner MRN: [id number] Date of Birth: 1955-08-22 Referring Provider (PT): Redmond, Bea Laura, NP   10th Visit Physical Therapy Progress Note  Dates of Reporting Period: 08/27/20 to 09/29/20    Encounter Date: 09/29/2020   PT End of Session - 09/29/20 1159    Visit Number 10    Number of Visits 17    Date for PT Re-Evaluation 11/25/20    Authorization Type BCBS, Medicare    PT Start Time 3212    PT Stop Time 1058    PT Time Calculation (min) 43 min    Equipment Utilized During Treatment Gait belt    Activity Tolerance Patient tolerated treatment well    Behavior During Therapy [facility] for tasks assessed/performed           Past Medical History:  Diagnosis Date  . Allergic rhinitis   . Allergy   . Anxiety   . Asthma   . CHF (congestive heart failure) ([place])   . Diabetes ([place])   . Diverticulitis   . Dyspnea   . GERD (gastroesophageal reflux disease)    very occ  . History of heart attack   . HLD (hyperlipidemia)   . HTN (hypertension)   . Myocardial infarction ([place]) 2000  . Sleep apnea    wears cpap   . Tubular adenoma of colon 09/2008    Past Surgical History:  Procedure Laterality Date  . CARDIAC CATHETERIZATION N/A 07/20/2016   Procedure: Left Heart Cath and Cors/Grafts Angiography;  Surgeon: Belva Crome, MD;  Location: [facility];  Service: Cardiovascular;  Laterality: N/A;  . CATARACT EXTRACTION Bilateral 06/06/2019   Dr. Tommy Rainwater. oct left, dec right 2020   . COLONOSCOPY    . CORONARY ANGIOPLASTY    . CORONARY ARTERY BYPASS GRAFT  06/1999  . POLYPECTOMY    . RENAL BIOPSY  06/2020  . TONSILLECTOMY     late50's early 60's    There were no vitals filed for this visit.   Subjective Assessment - 09/29/20 1018     Subjective Stairs are going well. Tried the exercises and they are well.    Patient is accompained by: Family member   beth   Pertinent History L MCA CVA, Coronary Artery Disease, Heart Failure, Hyperlipidemia, Osteoarthritis, GERD, Renal Cell Cancer requiring nephrectomy (active)    Limitations Standing;Walking;Other (comment)   stairs   How long can you walk comfortably? approx. 100' with RW    Patient Stated Goals go up and down the stairs by himself, find out what his new normal will be, walk without the RW    Currently in Pain? No/denies                             [facility] Adult PT Treatment/Exercise - 09/29/20 0001      Ambulation/Gait   Ambulation/Gait Yes    Ambulation/Gait Assistance 4: Min guard;5: Supervision    Ambulation/Gait Assistance Details between activities in session with [place] with quad tip    Assistive device Straight cane    Gait Pattern Step-through pattern    Ambulation Surface Level;Indoor      Exercises   Exercises Other Exercises    Other Exercises  x10 reps seated R ankle DF with red tband resistance  Balance Exercises - 09/29/20 0001      Balance Exercises: Standing   Stepping Strategy Anterior;Foam/compliant surface;UE support;10 reps;Limitations    Stepping Strategy Limitations standing on blue balance beam alternating heel taps to floor UE support and then fingertip support    Rockerboard Anterior/posterior;EO;Head turns    Rockerboard Limitations weight shift A/P with cues for hip/ankle strategy x15 reps - incr difficulty shifting weight forwards, keeping board still 2 x 10 reps head turns with min A at times for balance    Marching Upper extremity assist 1;Forwards;Limitations;Retro    Marching Limitations 3 reps down and back in // bars with red tband around thighs, performed on level ground    Other Standing Exercises on blue mat in // bars: side stepping down and back x3 reps with cues for incr foot clearance no  UE support, fwds marching x3 reps - cues for incr hip flexion ROM, retro walking x3 reps - cues for incr step length    Other Standing Exercises Comments on blue mat with BUE support: heel <> toe raises x10 reps             PT Education - 09/29/20 1159    Education Details reviewed seated R ankle DF with red tband for HEP as pt had not been performing    Person(s) Educated Patient    Methods Explanation;Demonstration    Comprehension Verbalized understanding;Returned demonstration            PT Short Term Goals - 09/24/20 1157      PT SHORT TERM GOAL #1   Title Pt will be independent with initial HEP and verbalize understanding of decr fall risk in the home. ALL STGS DUE 09/24/20    Baseline Independent    Time 4    Period Weeks    Status Achieved    Target Date 09/24/20      PT SHORT TERM GOAL #2   Title Pt will improve BERG to at least a 49/56 in order to demo decr fall risk.    Baseline BERG 44/56, 50/56 on 09/22/20    Time 4    Period Weeks    Status Achieved      PT SHORT TERM GOAL #3   Title Pt will perform 5x sit <> stand in 20 seconds or less with single UE support in order to demo incr balance.    Baseline 25 seconds, 24.78 with BUE support from arm chair, 4/10 RPE afterwards on 09/22/20    Time 4    Period Weeks    Status Not Met      PT SHORT TERM GOAL #4   Title Pt will improve gait speed with RW vs. LRAD to at least 2.4 ft/sec in order to demo improved community mobility.    Baseline 2.00 ft/sec; 1.60 ft/sec with LRAD (SPC)    Time 4    Period Weeks    Status Not Met      PT SHORT TERM GOAL #5   Title Pt will perform TUG with no AD in 17 seconds or less in order to demo decr fall risk.    Baseline 20.63 seconds, 17.5 seconds with SPC with quad tip on 09/22/20    Time 4    Period Weeks    Status Not Met             PT Long Term Goals - 08/27/20 1516      PT LONG TERM GOAL #1   Title Pt will be  independent with final HEP in order to build upon  functional gains made in therapy. ALL LTGS DUE 10/22/20    Time 8    Period Weeks    Status New    Target Date 10/22/20      PT LONG TERM GOAL #2   Title BERG/DGI goal to be written as appropriate.    Time 8    Period Weeks    Status New      PT LONG TERM GOAL #3   Title Pt will improve gait speed with LRAD vs. no AD to at least 2.7 ft/sec in order to demo improved community mobility.    Baseline 2.00 ft/sec    Time 8    Period Weeks    Status New      PT LONG TERM GOAL #4   Title Pt will perform TUG with no AD in 13.5 seconds or less in order to demo decr fall risk.    Baseline 20.63 seconds    Time 8    Period Weeks    Status New      PT LONG TERM GOAL #5   Title Pt will ambulate at least 500' with supervision over indoor/outdoor surfaces with LRAD in order to demo improved community mobility.    Time 8    Period Weeks    Status New      Additional Long Term Goals   Additional Long Term Goals Yes      PT LONG TERM GOAL #6   Title Pt will ambulate 12 steps with single handrail and step to vs. step through pattern with supervision in order to safely go up/down the stairs in his house.    Baseline 4 steps B handrails step through with CGA    Time 8    Period Weeks    Status New                 Plan - 09/29/20 1200    Clinical Impression Statement 10th visit PN: STGs assessed on 09/22/20: Pt improved TUG with SPC with quad tip to 17.5 seconds, but not to goal level (previously 20.63 seconds). Pt performed 5x sit <> stand  in 24.78 seconds with BUE support (previously 25 seconds) although pt reporting improved ease of transfer. Pt with improvement of BERG to a 50/56 (previously 44/56). Today's skilled session continued to focus on BLE strengthening and balance strategies with no UE support. Pt tolerated session well, needed rest breaks as appropriate. Will continue to progress towards LTGs.    Personal Factors and Comorbidities Comorbidity 3+;Past/Current Experience     Comorbidities L MCA CVA, Coronary Artery Disease, Heart Failure, Hyperlipidemia, Osteoarthritis, GERD, Renal Cell Cancer requiring nephrectomy (active)    Examination-Activity Limitations Locomotion Level;Stand;Transfers;Stairs    Examination-Participation Restrictions Community Activity;Occupation   work (is a Customer service manager)   [employer] Stable/Uncomplicated    Rehab Potential Good    PT Frequency 2x / week    PT Duration 8 weeks    PT Treatment/Interventions ADLs/Self Care Home Management;Stair training;Gait training;DME Instruction;Functional mobility training;Therapeutic activities;Therapeutic exercise;Balance training;Neuromuscular re-education;Patient/family education;Orthotic Fit/Training;Passive range of motion;Vestibular;Energy conservation    PT Next Visit Plan continue gait training with SPC w/ quad tip - obstacle negotiation. balance on compliant surfaces. RLE strength. Scifit for aerobic warm up    PT Home Exercise Plan added eccentric sitting to HEP, 5 reps per set    Consulted and Agree with Plan of Care Patient;Family member/caregiver    Family Member Consulted wife, [name]  Patient will benefit from skilled therapeutic intervention in order to improve the following deficits and impairments:  Abnormal gait,Decreased activity tolerance,Decreased balance,Decreased endurance,Decreased knowledge of use of DME,Decreased range of motion,Decreased strength,Difficulty walking,Decreased safety awareness  Visit Diagnosis: Muscle weakness (generalized)  Unsteadiness on feet  Other abnormalities of gait and mobility  Other lack of coordination     Problem List Patient Active Problem List   Diagnosis Date Noted  . Acute on chronic combined systolic and diastolic CHF (congestive heart failure) ([place]) 07/17/2020  . Renal cell carcinoma ([place]) 07/17/2020  . Protein-calorie malnutrition, severe 07/08/2020  . Acute respiratory failure with hypoxia ([place])  07/05/2020  . Iron deficiency anemia due to chronic blood loss 06/19/2020  . OSA (obstructive sleep apnea) 01/09/2018  . Pulmonary nodule, left 03/15/2017  . Former smoker 11/04/2014  . Insomnia 05/27/2014  . Plantar wart of left foot 11/27/2010  . Type 2 diabetes mellitus with other specified complication ([place]) 35/33/1740  . PSA, INCREASED 09/15/2009  . Diverticulitis of colon 05/08/2009  . Asthma, moderate persistent 06/19/2008  . ANKLE PAIN, CHRONIC 06/05/2007  . Allergic rhinitis 05/17/2007  . ERECTILE DYSFUNCTION, SECONDARY TO MEDICATION 05/17/2007  . Hyperlipidemia 04/06/2007  . Anxiety state 04/06/2007  . Essential hypertension 04/06/2007  . Coronary artery disease involving native coronary artery of native heart without angina pectoris 04/06/2007    Arliss Journey, PT, DPT  09/29/2020, 12:06 PM  [facility] [address] [place], [place], [address] Phone: [phone]   Fax:  [phone]  Name: Larry Garner MRN: [id number] Date of Birth: March 23, 1956

## 2020-09-29 NOTE — Patient Instructions (Signed)

## 2020-09-29 NOTE — Therapy (Signed)
[facility] [address] [place], [place], [address] Phone: [phone]   Fax:  [phone]  Occupational Therapy Treatment & Recertification  Patient Details  Name: Larry Garner MRN: [id number] Date of Birth: 17-Feb-1956 Referring Provider (OT): Bea Laura Redmond   Encounter Date: 09/29/2020   OT End of Session - 09/29/20 1103    Visit Number 10    Number of Visits 20    Date for OT Re-Evaluation 11/06/20   renewal completed 09/29/20 for 4 additional weeks   Authorization Type BCBS and Medicare    Authorization Time Period Renewal completed 09/29/20    Authorization - Visit Number 10    Authorization - Number of Visits 20    Progress Note Due on Visit 20    OT Start Time 1100    OT Stop Time 1147    OT Time Calculation (min) 47 min    Activity Tolerance Patient tolerated treatment well    Behavior During Therapy [facility] for tasks assessed/performed           Past Medical History:  Diagnosis Date  . Allergic rhinitis   . Allergy   . Anxiety   . Asthma   . CHF (congestive heart failure) ([place])   . Diabetes ([place])   . Diverticulitis   . Dyspnea   . GERD (gastroesophageal reflux disease)    very occ  . History of heart attack   . HLD (hyperlipidemia)   . HTN (hypertension)   . Myocardial infarction ([place]) 2000  . Sleep apnea    wears cpap   . Tubular adenoma of colon 09/2008    Past Surgical History:  Procedure Laterality Date  . CARDIAC CATHETERIZATION N/A 07/20/2016   Procedure: Left Heart Cath and Cors/Grafts Angiography;  Surgeon: Belva Crome, MD;  Location: [place] [facility];  Service: Cardiovascular;  Laterality: N/A;  . CATARACT EXTRACTION Bilateral 06/06/2019   Dr. Tommy Rainwater. oct left, dec right 2020   . COLONOSCOPY    . CORONARY ANGIOPLASTY    . CORONARY ARTERY BYPASS GRAFT  06/1999  . POLYPECTOMY    . RENAL BIOPSY  06/2020  . TONSILLECTOMY     late50's early 60's    There were no vitals  filed for this visit.   Subjective Assessment - 09/29/20 1105    Subjective  Pt denies any pain. Pt has 3rd immunotherapy infusion tomorrow.    Patient is accompanied by: Family member    Limitations active renal cell cancer. fall risk. no driving    Patient Stated Goals I have trouble with dates and months and ordinal things    Currently in Pain? No/denies                        OT Treatments/Exercises (OP) - 09/29/20 1115      Cognitive Exercises   Attention Span Alternating resistance clothespins while word finding items that are that color. Pt required increased time and min cues for word finding and retrieval of items    Other Cognitive Exercises 1 Reading a Map on Constant Therapy Level 2 with 100% accuracy and 93.61s response time                  OT Education - 09/29/20 1128    Education Details [employer] - see pt instructions    Person(s) Educated Patient;Spouse    Methods Explanation;Demonstration;Verbal cues;Handout;Tactile cues    Comprehension Verbalized understanding;Returned demonstration;Need further instruction  OT Short Term Goals - 09/29/20 1104      OT SHORT TERM GOAL #1   Title Pt will be independent with HEP 09/05/20    Time 3    Period Weeks    Status Achieved    Target Date 09/05/20      OT SHORT TERM GOAL #2   Title Pt will increase grip strength in LUE to 42 lbs or more for increase in functional use of LUE.    Baseline RUE 52.6 LUE 38.8    Time 3    Period Weeks    Status On-going   32.6 lbs LUE 09/29/20     OT SHORT TERM GOAL #3   Title Pt will perform simple warm meal prep with supervision for return to prior level of functioning    Time 3    Period Weeks    Status Achieved      OT SHORT TERM GOAL #4   Title Pt will increase standing tolerance to 10 minutes or greater for increasing overall endurance and activity tolerance    Time 3    Period Weeks    Status Achieved             OT Long  Term Goals - 09/29/20 1107      OT LONG TERM GOAL #1   Title *Pt will be independent with updated HEP for UE proximal strengthening and grip strengthening 12/08/2020    Time 10    Period Weeks    Status On-going    Target Date 12/08/20      OT LONG TERM GOAL #2   Title *Pt will increase grip strength in LUE to 48 lbs or greater for increase in overall functional use of LUE    Baseline RUE 52.6 LUE 38.8    Time 6    Period Weeks    Status On-going   32.6lbs LUE     OT LONG TERM GOAL #3   Title Pt will complete 9 hole peg test in 32 seconds or less with LUE for increase in fine motor coordination.    Baseline RUE 28.69, LUE 37.03    Time 6    Period Weeks    Status Achieved   24.34     OT LONG TERM GOAL #4   Title Pt will complete mod complex meal prep with mod I for increase in return to prior level of functioning and home management and meal prep tasks    Time 6    Period Weeks    Status Achieved   completed at home with increased time.     OT LONG TERM GOAL #5   Title Pt will tolerate 15 minutes of continuous standing activities for increase in overall standing tolerance and activity tolerance for completing IADLs.    Time 6    Period Weeks    Status Achieved   pt is completing standing for > 15 minutes with cooking at home etc     OT LONG TERM GOAL #6   Title *Pt will complete physical and cognitive task simultaneously with 90% accuracy.    Time 6    Period Weeks    Status On-going   75-80% with diff continuing walking with thinking     OT LONG TERM GOAL #7   Title Complete FOTO at discharge    Time 6    Period Weeks    Status On-going  Plan - 09/29/20 1109    Clinical Impression Statement Progress Note and renewal for period 08/15/2020 to 09/29/2020. This is patient's 10th visit. Pt has met all STGs with exception of grip strength (met 3/4). Pt continues to progress towards LTGs (met 3/7) and is working towards increasing grip strength, multi  tasking, and continuing to receive HEPs and education for things to do at home. Skilled occupational therapy is beneficial for patient to continue with UE strengthening, conditioning, grip strengthening and dual tasking.    OT Occupational Profile and History Problem Focused Assessment - Including review of records relating to presenting problem    Occupational performance deficits (Please refer to evaluation for details): IADL's;ADL's;Leisure    Body Structure / Function / Physical Skills ADL;Decreased knowledge of use of DME;Strength;GMC;Balance;UE functional use;IADL;ROM;Endurance;FMC;Coordination    Rehab Potential Good    Clinical Decision Making Limited treatment options, no task modification necessary    Comorbidities Affecting Occupational Performance: None    OT Frequency 2x / week    OT Duration 6 weeks    OT Treatment/Interventions Self-care/ADL training;DME and/or AE instruction;Therapeutic activities;Therapeutic exercise;Functional Mobility Training;Energy conservation;Patient/family education    Plan renewal completed 09/29/20 - continue progressing towards goals.    OT Home Exercise Plan coordination, putty    Consulted and Agree with Plan of Care Patient;Family member/caregiver    Family Member Consulted spouse, [name]           Patient will benefit from skilled therapeutic intervention in order to improve the following deficits and impairments:   Body Structure / Function / Physical Skills: ADL,Decreased knowledge of use of DME,Strength,GMC,Balance,UE functional use,IADL,ROM,Endurance,FMC,Coordination       Visit Diagnosis: Muscle weakness (generalized) - Plan: Ot plan of care cert/re-cert  Other lack of coordination - Plan: Ot plan of care cert/re-cert  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 Acute on chronic combined systolic and diastolic CHF (congestive heart failure) ([place]) 07/17/2020  . Renal cell carcinoma ([place]) 07/17/2020  . Protein-calorie malnutrition, severe 07/08/2020  . Acute respiratory failure with hypoxia ([place]) 07/05/2020  . Iron deficiency anemia due to chronic blood loss 06/19/2020  . OSA (obstructive sleep apnea) 01/09/2018  . Pulmonary nodule, left 03/15/2017  . Former smoker 11/04/2014  . Insomnia 05/27/2014  . Plantar wart of left foot 11/27/2010  . Type 2 diabetes mellitus with other specified complication ([place]) 76/54/6503  . PSA, INCREASED 09/15/2009  . Diverticulitis of colon 05/08/2009  . Asthma, moderate persistent 06/19/2008  . ANKLE PAIN, CHRONIC 06/05/2007  . Allergic rhinitis 05/17/2007  . ERECTILE DYSFUNCTION, SECONDARY TO MEDICATION 05/17/2007  . Hyperlipidemia 04/06/2007  . Anxiety state 04/06/2007  . Essential hypertension 04/06/2007  . Coronary artery disease involving native coronary artery of native heart without angina pectoris 04/06/2007    Zachery Conch MOT, OTR/L  09/29/2020, 12:16 PM  [facility] [address] [place], [place], [address] Phone: [phone]   Fax:  [phone]  Name: Larry Garner MRN: [id number] Date of Birth: 20-Feb-1956

## 2020-10-01 ENCOUNTER — Encounter: Payer: Self-pay | Admitting: Physical Therapy

## 2020-10-01 ENCOUNTER — Other Ambulatory Visit: Payer: Self-pay

## 2020-10-01 ENCOUNTER — Encounter: Payer: Self-pay | Admitting: Occupational Therapy

## 2020-10-01 ENCOUNTER — Ambulatory Visit: Payer: BC Managed Care – PPO | Admitting: Physical Therapy

## 2020-10-01 ENCOUNTER — Ambulatory Visit: Payer: BC Managed Care – PPO | Admitting: Occupational Therapy

## 2020-10-01 DIAGNOSIS — R41844 Frontal lobe and executive function deficit: Secondary | ICD-10-CM

## 2020-10-01 DIAGNOSIS — R2681 Unsteadiness on feet: Secondary | ICD-10-CM

## 2020-10-01 DIAGNOSIS — I69854 Hemiplegia and hemiparesis following other cerebrovascular disease affecting left non-dominant side: Secondary | ICD-10-CM

## 2020-10-01 DIAGNOSIS — M6281 Muscle weakness (generalized): Secondary | ICD-10-CM | POA: Diagnosis not present

## 2020-10-01 DIAGNOSIS — R41840 Attention and concentration deficit: Secondary | ICD-10-CM

## 2020-10-01 DIAGNOSIS — R2689 Other abnormalities of gait and mobility: Secondary | ICD-10-CM

## 2020-10-01 DIAGNOSIS — R278 Other lack of coordination: Secondary | ICD-10-CM

## 2020-10-01 NOTE — Therapy (Signed)
[facility] [address] [place], [place], [address] Phone: [phone]   Fax:  [phone]  Physical Therapy Treatment  Patient Details  Name: Larry Garner MRN: [id number] Date of Birth: 02/04/1956 Referring Provider (PT): Redmond, Bea Laura, NP   Encounter Date: 10/01/2020   PT End of Session - 10/01/20 1206    Visit Number 11    Number of Visits 17    Date for PT Re-Evaluation 11/25/20    Authorization Type BCBS, Medicare    PT Start Time 1017    PT Stop Time 1101    PT Time Calculation (min) 44 min    Equipment Utilized During Treatment Gait belt    Activity Tolerance Patient tolerated treatment well    Behavior During Therapy [facility] for tasks assessed/performed           Past Medical History:  Diagnosis Date  . Allergic rhinitis   . Allergy   . Anxiety   . Asthma   . CHF (congestive heart failure) ([place])   . Diabetes ([place])   . Diverticulitis   . Dyspnea   . GERD (gastroesophageal reflux disease)    very occ  . History of heart attack   . HLD (hyperlipidemia)   . HTN (hypertension)   . Myocardial infarction ([place]) 2000  . Sleep apnea    wears cpap   . Tubular adenoma of colon 09/2008    Past Surgical History:  Procedure Laterality Date  . CARDIAC CATHETERIZATION N/A 07/20/2016   Procedure: Left Heart Cath and Cors/Grafts Angiography;  Surgeon: Belva Crome, MD;  Location: [place] [facility];  Service: Cardiovascular;  Laterality: N/A;  . CATARACT EXTRACTION Bilateral 06/06/2019   Dr. Tommy Rainwater. oct left, dec right 2020   . COLONOSCOPY    . CORONARY ANGIOPLASTY    . CORONARY ARTERY BYPASS GRAFT  06/1999  . POLYPECTOMY    . RENAL BIOPSY  06/2020  . TONSILLECTOMY     late50's early 60's    There were no vitals filed for this visit.   Subjective Assessment - 10/01/20 1021    Subjective No changes since he was last here. Did some cooking last night. Did a lot of walking yesterday.    Patient is  accompained by: Family member   beth   Pertinent History L MCA CVA, Coronary Artery Disease, Heart Failure, Hyperlipidemia, Osteoarthritis, GERD, Renal Cell Cancer requiring nephrectomy (active)    Limitations Standing;Walking;Other (comment)   stairs   How long can you walk comfortably? approx. 100' with RW    Patient Stated Goals go up and down the stairs by himself, find out what his new normal will be, walk without the RW    Currently in Pain? No/denies                             [facility] Adult PT Treatment/Exercise - 10/01/20 1022      Transfers   Transfers Sit to Stand;Stand to Sit    Sit to Stand 5: Supervision    Stand to Sit 5: Supervision    Comments from 22.5" mat table: 2 x 5 reps sit <> stands without UE support, x6 reps on blue air ex with BUE support, no UE support when sitting ,cues for eccentric control      Ambulation/Gait   Ambulation/Gait Yes    Ambulation/Gait Assistance 4: Min guard;5: Supervision    Ambulation/Gait Assistance Details in between activities  Assistive device Straight cane   with quad tip   Gait Pattern Step-through pattern    Ambulation Surface Level;Indoor    Stairs Yes    Stairs Assistance 4: Min guard;5: Supervision    Stairs Assistance Details (indicate cue type and reason) pt needing cues throughout for proper sequencing with needing to step up with stronger LLE and descend with weaker RLE first    Stair Management Technique One rail Right;Step to pattern;With cane;Forwards   with cane in LUE   Number of Stairs 4   x3 = 12 total   Height of Stairs 6      Knee/Hip Exercises: Aerobic   Other Aerobic Completed SciFit on Level 2.0 x 5 minutes with BUE/BLE for improved endurance/activity tolerance.               Balance Exercises - 10/01/20 0001      Balance Exercises: Standing   Standing Eyes Opened Narrow base of support (BOS);Foam/compliant surface;Limitations;Head turns    Standing Eyes Opened Limitations  standing on airex completed horizontal/vertical head turns x 10 reps each direction. increased challenge with vertical.    Standing Eyes Closed Wide (BOA);Foam/compliant surface   on blue air ex   Standing Eyes Closed Limitations wide BOS x30 seconds, with more narrow BOS 2 x 20 seconds             PT Education - 10/01/20 1201    Education Details making sure wife is present during stairs for safety and proper sequencing    Person(s) Educated Patient    Methods Explanation;Demonstration;Verbal cues    Comprehension Verbalized understanding;Returned demonstration            PT Short Term Goals - 09/24/20 1157      PT SHORT TERM GOAL #1   Title Pt will be independent with initial HEP and verbalize understanding of decr fall risk in the home. ALL STGS DUE 09/24/20    Baseline Independent    Time 4    Period Weeks    Status Achieved    Target Date 09/24/20      PT SHORT TERM GOAL #2   Title Pt will improve BERG to at least a 49/56 in order to demo decr fall risk.    Baseline BERG 44/56, 50/56 on 09/22/20    Time 4    Period Weeks    Status Achieved      PT SHORT TERM GOAL #3   Title Pt will perform 5x sit <> stand in 20 seconds or less with single UE support in order to demo incr balance.    Baseline 25 seconds, 24.78 with BUE support from arm chair, 4/10 RPE afterwards on 09/22/20    Time 4    Period Weeks    Status Not Met      PT SHORT TERM GOAL #4   Title Pt will improve gait speed with RW vs. LRAD to at least 2.4 ft/sec in order to demo improved community mobility.    Baseline 2.00 ft/sec; 1.60 ft/sec with LRAD (SPC)    Time 4    Period Weeks    Status Not Met      PT SHORT TERM GOAL #5   Title Pt will perform TUG with no AD in 17 seconds or less in order to demo decr fall risk.    Baseline 20.63 seconds, 17.5 seconds with SPC with quad tip on 09/22/20    Time 4    Period Weeks    Status  Not Met             PT Long Term Goals - 08/27/20 1516      PT LONG  TERM GOAL #1   Title Pt will be independent with final HEP in order to build upon functional gains made in therapy. ALL LTGS DUE 10/22/20    Time 8    Period Weeks    Status New    Target Date 10/22/20      PT LONG TERM GOAL #2   Title BERG/DGI goal to be written as appropriate.    Time 8    Period Weeks    Status New      PT LONG TERM GOAL #3   Title Pt will improve gait speed with LRAD vs. no AD to at least 2.7 ft/sec in order to demo improved community mobility.    Baseline 2.00 ft/sec    Time 8    Period Weeks    Status New      PT LONG TERM GOAL #4   Title Pt will perform TUG with no AD in 13.5 seconds or less in order to demo decr fall risk.    Baseline 20.63 seconds    Time 8    Period Weeks    Status New      PT LONG TERM GOAL #5   Title Pt will ambulate at least 500' with supervision over indoor/outdoor surfaces with LRAD in order to demo improved community mobility.    Time 8    Period Weeks    Status New      Additional Long Term Goals   Additional Long Term Goals Yes      PT LONG TERM GOAL #6   Title Pt will ambulate 12 steps with single handrail and step to vs. step through pattern with supervision in order to safely go up/down the stairs in his house.    Baseline 4 steps B handrails step through with CGA    Time 8    Period Weeks    Status New                 Plan - 10/01/20 1212    Clinical Impression Statement Today's skilled session continued to focus on BLE strengthening with sit <> stands and SciFit, balance on compliant surfaces, and stair training. Pt needing cues for proper sequencing for ascending and descending stairs - discussed pt's wife being with him at all times still for stair training. Pt tolerated session well, will continue to progress towards LTGs.    Personal Factors and Comorbidities Comorbidity 3+;Past/Current Experience    Comorbidities L MCA CVA, Coronary Artery Disease, Heart Failure, Hyperlipidemia, Osteoarthritis, GERD,  Renal Cell Cancer requiring nephrectomy (active)    Examination-Activity Limitations Locomotion Level;Stand;Transfers;Stairs    Examination-Participation Restrictions Community Activity;Occupation   work (is a Customer service manager)   [employer] Stable/Uncomplicated    Rehab Potential Good    PT Frequency 2x / week    PT Duration 8 weeks    PT Treatment/Interventions ADLs/Self Care Home Management;Stair training;Gait training;DME Instruction;Functional mobility training;Therapeutic activities;Therapeutic exercise;Balance training;Neuromuscular re-education;Patient/family education;Orthotic Fit/Training;Passive range of motion;Vestibular;Energy conservation    PT Next Visit Plan continue gait training with SPC w/ quad tip - obstacle negotiation. balance on compliant surfaces. RLE strength. Scifit for aerobic warm up    PT Home Exercise Plan added eccentric sitting to HEP, 5 reps per set    Consulted and Agree with Plan of Care Patient;Family member/caregiver    Family  Member Consulted wife, [name]           Patient will benefit from skilled therapeutic intervention in order to improve the following deficits and impairments:  Abnormal gait,Decreased activity tolerance,Decreased balance,Decreased endurance,Decreased knowledge of use of DME,Decreased range of motion,Decreased strength,Difficulty walking,Decreased safety awareness  Visit Diagnosis: Muscle weakness (generalized)  Unsteadiness on feet  Other abnormalities of gait and mobility  Other lack of coordination     Problem List Patient Active Problem List   Diagnosis Date Noted  . Acute on chronic combined systolic and diastolic CHF (congestive heart failure) ([place]) 07/17/2020  . Renal cell carcinoma ([place]) 07/17/2020  . Protein-calorie malnutrition, severe 07/08/2020  . Acute respiratory failure with hypoxia ([place]) 07/05/2020  . Iron deficiency anemia due to chronic blood loss 06/19/2020  . OSA (obstructive sleep apnea)  01/09/2018  . Pulmonary nodule, left 03/15/2017  . Former smoker 11/04/2014  . Insomnia 05/27/2014  . Plantar wart of left foot 11/27/2010  . Type 2 diabetes mellitus with other specified complication ([place]) 68/16/6196  . PSA, INCREASED 09/15/2009  . Diverticulitis of colon 05/08/2009  . Asthma, moderate persistent 06/19/2008  . ANKLE PAIN, CHRONIC 06/05/2007  . Allergic rhinitis 05/17/2007  . ERECTILE DYSFUNCTION, SECONDARY TO MEDICATION 05/17/2007  . Hyperlipidemia 04/06/2007  . Anxiety state 04/06/2007  . Essential hypertension 04/06/2007  . Coronary artery disease involving native coronary artery of native heart without angina pectoris 04/06/2007    Arliss Journey, PT, DPT  10/01/2020, 12:16 PM  [facility] [address] [place], [place], [address] Phone: [phone]   Fax:  [phone]  Name: KORRY DALGLEISH MRN: [id number] Date of Birth: 09-20-1955

## 2020-10-01 NOTE — Therapy (Signed)
[facility] [address] [place], [place], [address] Phone: [phone]   Fax:  [phone]  Occupational Therapy Treatment  Patient Details  Name: Larry Garner MRN: [id number] Date of Birth: 1955-11-15 Referring Provider (OT): Bea Laura Redmond   Encounter Date: 10/01/2020   OT End of Session - 10/01/20 1106    Visit Number 11    Number of Visits 20    Date for OT Re-Evaluation 11/06/20   renewal completed 09/29/20 for 4 additional weeks   Authorization Type BCBS and Medicare    Authorization Time Period Week 1 of 5 (10/01/20) from renewal    Authorization - Visit Number 11    Authorization - Number of Visits 20    Progress Note Due on Visit 20    OT Start Time 1102    OT Stop Time 1148    OT Time Calculation (min) 46 min    Activity Tolerance Patient tolerated treatment well    Behavior During Therapy [facility] for tasks assessed/performed           Past Medical History:  Diagnosis Date  . Allergic rhinitis   . Allergy   . Anxiety   . Asthma   . CHF (congestive heart failure) ([place])   . Diabetes ([place])   . Diverticulitis   . Dyspnea   . GERD (gastroesophageal reflux disease)    very occ  . History of heart attack   . HLD (hyperlipidemia)   . HTN (hypertension)   . Myocardial infarction ([place]) 2000  . Sleep apnea    wears cpap   . Tubular adenoma of colon 09/2008    Past Surgical History:  Procedure Laterality Date  . CARDIAC CATHETERIZATION N/A 07/20/2016   Procedure: Left Heart Cath and Cors/Grafts Angiography;  Surgeon: Belva Crome, MD;  Location: [facility];  Service: Cardiovascular;  Laterality: N/A;  . CATARACT EXTRACTION Bilateral 06/06/2019   Dr. Tommy Rainwater. oct left, dec right 2020   . COLONOSCOPY    . CORONARY ANGIOPLASTY    . CORONARY ARTERY BYPASS GRAFT  06/1999  . POLYPECTOMY    . RENAL BIOPSY  06/2020  . TONSILLECTOMY     late50's early 60's    There were no vitals filed for  this visit.   Subjective Assessment - 10/01/20 1109    Subjective  Pt had 3rd infusion. Pt denies any pain or symptoms/side effects.    Patient is accompanied by: Family member    Limitations active renal cell cancer. fall risk. no driving    Patient Stated Goals I have trouble with dates and months and ordinal things    Currently in Pain? No/denies                        OT Treatments/Exercises (OP) - 10/01/20 1116      Cognitive Exercises   Attention Span Alternating Constant Therapy Alternating Symbols level 2 Alternating Symbols with 86% accuracy and 70.98s response time. Pt with better attention to task than previous sessions. Pt required increased time.     Visual/Perceptual Exercises   Copy this Image PVC    PVC Fig 11. moderate to maximal visual and verbal cues for identifying correct pieces for replicating design. Once patient got going with increased time and processing, he did better and required min cues and did well. Pt did Fig 13 after with no cues.  OT Short Term Goals - 09/29/20 1104      OT SHORT TERM GOAL #1   Title Pt will be independent with HEP 09/05/20    Time 3    Period Weeks    Status Achieved    Target Date 09/05/20      OT SHORT TERM GOAL #2   Title Pt will increase grip strength in LUE to 42 lbs or more for increase in functional use of LUE.    Baseline RUE 52.6 LUE 38.8    Time 3    Period Weeks    Status On-going   32.6 lbs LUE 09/29/20     OT SHORT TERM GOAL #3   Title Pt will perform simple warm meal prep with supervision for return to prior level of functioning    Time 3    Period Weeks    Status Achieved      OT SHORT TERM GOAL #4   Title Pt will increase standing tolerance to 10 minutes or greater for increasing overall endurance and activity tolerance    Time 3    Period Weeks    Status Achieved             OT Long Term Goals - 09/29/20 1107      OT LONG TERM GOAL #1   Title *Pt will  be independent with updated HEP for UE proximal strengthening and grip strengthening 12/08/2020    Time 10    Period Weeks    Status On-going    Target Date 12/08/20      OT LONG TERM GOAL #2   Title *Pt will increase grip strength in LUE to 48 lbs or greater for increase in overall functional use of LUE    Baseline RUE 52.6 LUE 38.8    Time 6    Period Weeks    Status On-going   32.6lbs LUE     OT LONG TERM GOAL #3   Title Pt will complete 9 hole peg test in 32 seconds or less with LUE for increase in fine motor coordination.    Baseline RUE 28.69, LUE 37.03    Time 6    Period Weeks    Status Achieved   24.34     OT LONG TERM GOAL #4   Title Pt will complete mod complex meal prep with mod I for increase in return to prior level of functioning and home management and meal prep tasks    Time 6    Period Weeks    Status Achieved   completed at home with increased time.     OT LONG TERM GOAL #5   Title Pt will tolerate 15 minutes of continuous standing activities for increase in overall standing tolerance and activity tolerance for completing IADLs.    Time 6    Period Weeks    Status Achieved   pt is completing standing for > 15 minutes with cooking at home etc     OT LONG TERM GOAL #6   Title *Pt will complete physical and cognitive task simultaneously with 90% accuracy.    Time 6    Period Weeks    Status On-going   75-80% with diff continuing walking with thinking     OT LONG TERM GOAL #7   Title Complete FOTO at discharge    Time 6    Period Weeks    Status On-going  Plan - 10/01/20 1137    Clinical Impression Statement Pt with improved cognitive attention this day with tasks.    OT Occupational Profile and History Problem Focused Assessment - Including review of records relating to presenting problem    Occupational performance deficits (Please refer to evaluation for details): IADL's;ADL's;Leisure    Body Structure / Function / Physical  Skills ADL;Decreased knowledge of use of DME;Strength;GMC;Balance;UE functional use;IADL;ROM;Endurance;FMC;Coordination    Rehab Potential Good    Clinical Decision Making Limited treatment options, no task modification necessary    Comorbidities Affecting Occupational Performance: None    OT Frequency 2x / week    OT Duration Other (comment)   2x/week for 5 weeks after renewal.   OT Treatment/Interventions Self-care/ADL training;DME and/or AE instruction;Therapeutic activities;Therapeutic exercise;Functional Mobility Training;Energy conservation;Patient/family education    Plan continue progressing towards goals.    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 Acute on chronic combined systolic and diastolic CHF (congestive heart failure) ([place]) 07/17/2020  . Renal cell carcinoma ([place]) 07/17/2020  . Protein-calorie malnutrition, severe 07/08/2020  . Acute respiratory failure with hypoxia ([place]) 07/05/2020  . Iron deficiency anemia due to chronic blood loss 06/19/2020  . OSA (obstructive sleep apnea) 01/09/2018  . Pulmonary nodule, left 03/15/2017  . Former smoker 11/04/2014  .  Insomnia 05/27/2014  . Plantar wart of left foot 11/27/2010  . Type 2 diabetes mellitus with other specified complication ([place]) 76/72/0947  . PSA, INCREASED 09/15/2009  . Diverticulitis of colon 05/08/2009  . Asthma, moderate persistent 06/19/2008  . ANKLE PAIN, CHRONIC 06/05/2007  . Allergic rhinitis 05/17/2007  . ERECTILE DYSFUNCTION, SECONDARY TO MEDICATION 05/17/2007  . Hyperlipidemia 04/06/2007  . Anxiety state 04/06/2007  . Essential hypertension 04/06/2007  . Coronary artery disease involving native coronary artery of native heart without angina pectoris 04/06/2007    Zachery Conch MOT, OTR/L  10/01/2020, 11:49 AM  [facility] [address] [place], [place], [address] Phone: [phone]   Fax:  [phone]  Name: Larry Garner MRN: [id number] Date of Birth: 04-22-1956

## 2020-10-02 ENCOUNTER — Telehealth: Payer: Self-pay

## 2020-10-02 NOTE — Telephone Encounter (Signed)
Nurse Assessment Nurse: Marina Gravel, RN, Carmelia Bake Date/Time Eilene Ghazi Time): 10/01/2020 6:42:39 PM Please select the assessment type ---Pharmacy clarification Additional Documentation ---Caller states on the order for Ozempic Pen. it has to administer 0.25mg  1 per week x28 and then 0.5mg  1 per week x14 days Needs to know what the final dose will be. If it is 1mg  weekly, then they will need a new order written for this. Is there an on-call physician for the client? ---Yes Do the client directives allow paging the on call for medication concerns? ---No Disp. Time Eilene Ghazi Time) Disposition Final User 10/01/2020 6:47:54 PM Clinical Call Yes Marina Gravel, RN, Carmelia Bake

## 2020-10-02 NOTE — Telephone Encounter (Signed)
Will call once clarified by Dr. Yong Channel.

## 2020-10-02 NOTE — Telephone Encounter (Signed)
Lab Results  Component Value Date   HGBA1C 6.4 (H) 07/05/2020  How are his sugars doing on the 0.5 mg? Would he be willing to come in for even just a nurse visit for POC a1c? If remains controlled id be fine with 0.5mg . if he feels #s have done well and does not prefer poc a1c- let me know and I will just refill 0.5 mg (or you can simply send it in for him) for weekly dosing

## 2020-10-02 NOTE — Progress Notes (Signed)
Cardiology Office Note:    Date:  10/08/2020   ID:  RASHAUN WICHERT, DOB 12-05-1955, MRN [id number]  PCP:  Marin Olp, MD  [facility] Cardiologist:  Jenkins Rouge, MD   [facility] Electrophysiologist:  None   Referring MD: Marin Olp, MD   Chief Complaint:  No chief complaint on file.    Patient Profile:    Larry Garner is a 65 y.o. 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  admx to [facility] >> EF 20-25 >> Started on empiric anticoagulation with Apixaban   Diabetes mellitus   Hypertension   Hyperlipidemia   OSA   GERD   Renal Cell CA  Prior CV studies: Echocardiogram 07/31/2020 EF 20-25, normal RVSF, mild BAE, mild MR, mild-moderate TR, RVSP 49  Echocardiogram 07/06/20 EF 45-50, mild LVH, Gr 2 DD, inf and apical AK, low normal RVSF mod LAE, mild MR, trivial AI, RVSP 47.7  Cardiac catheterization 07/20/2016 LAD proximal stent 100, distal 50; D1 60-70, D2 100 RI 100 RCA mid 100 LIMA-LAD patent SVG-DX patent SVG-OM1/RI patent EF 45-50   History of Present Illness:     65 y.o. with history of CAD/CABG patent grafts cath 2017 with occluded RCA collateralized not grafted Recent diagnosis renal cell carcinoma needs nephrectomy  Admitted to [facility] end of December with left parietal stroke Placed on eliquis and ASA. History of needing iv iron and transfusion.  At [facility] EF newly decreased to 20-25% from 45-50% previously He has not had any angina or just pain to suggest any interim  MI  ECG with chronic changes RBBB LAFB Zio patch monitor with no PAF    He has not  had symptoms of CHF , angina or palpitations. Started immunoRx at [facility] F/U echo here 09/11/20 EF 30-35% myovue 08/20/20 old inferior scar no ischemia similar To nuclear study done 2017  Has had 4 vaccines most recent 09/23/20  Getting PT/OT rx through [facility]   Still some LE edema No angina Discussed taking lasix daily D/c norvasc may contribute to edema and not ideal for CHF/ischemic DCM Increase to mid dose Entresto  Push to get neprhectomy/surgery first week in April This will be 3 months post stroke His CHF is compensated and not having angina He understands still at moderate Risk for cardiac complications with surgery He needs MRI/CT to further stage cancer To see if minimally invasive surgery possible  He will have f/u BMET and labs in 2 weeks At [facility]    Past Medical History:  Diagnosis Date  . Allergic rhinitis   . Allergy   . Anxiety   . Asthma   . CHF (congestive heart failure) ([place])   . Diabetes ([place])   . Diverticulitis   . Dyspnea   . GERD (gastroesophageal reflux disease)    very occ  . History of heart attack   . HLD (hyperlipidemia)   . HTN (hypertension)   . Myocardial infarction ([place]) 2000  . Sleep apnea    wears cpap   .  Tubular adenoma of colon 09/2008    Current Medications: No outpatient medications have been marked as taking for the 10/08/20 encounter (Appointment) with Josue Hector, MD.     Allergies:   Morphine sulfate   Social History   Tobacco Use  . Smoking status: Former Smoker    Packs/day: 0.50    Years: 4.00    Pack years: 2.00    Types: Cigarettes    Quit date: 09/29/1977    Years since quitting: 43.0  . Smokeless tobacco: Never Used  . Tobacco comment: quit on 1980  Vaping Use  . Vaping Use: Never used  Substance Use Topics  . Alcohol use: Not Currently  . Drug use: No     Family Hx: The patient's family history includes Alzheimer's disease in his mother; Colon cancer in his paternal aunt; Colon polyps in his father and  mother; Hyperlipidemia in his father; Hypertension in his father and mother. There is no history of Pancreatic cancer, Stomach cancer, Thyroid disease, Esophageal cancer, or Rectal cancer.  ROS   EKGs/Labs/Other Test Reviewed:    EKG:  EKG is  ordered today.  The ekg ordered today demonstrates normal sinus rhythm, heart rate 83, leftward axis, right bundle branch block, T wave inversions 1, aVL, 2, aVF, V3-V6, QTC 531 (manual measurement by me calculates to 475 ms), similar to prior EKG  Recent Labs: 07/05/2020: B Natriuretic Peptide 1,435.5; Magnesium 2.1; TSH 0.369 07/17/2020: ALT 16; Hemoglobin 8.6; Platelets 396 08/20/2020: BUN 21; Creatinine, Ser 0.84; Potassium 4.5; Sodium 130   Recent Lipid Panel Lab Results  Component Value Date/Time   CHOL 97 02/21/2020 04:52 PM   TRIG 175.0 (H) 02/21/2020 04:52 PM   HDL 27.60 (L) 02/21/2020 04:52 PM   CHOLHDL 4 02/21/2020 04:52 PM   LDLCALC 34 02/21/2020 04:52 PM   LDLDIRECT 54.0 01/16/2019 09:30 AM   Labs obtained through care everywhere- personally reviewed and interpreted: 08/02/2020: K+ 4.2, creatinine 0.75 07/31/2020: Na 131, K+ 4.5, BUN 26, creatinine 0.89, calcium 8.4, albumin 2.8, ALT 22  Risk Assessment/Calculations:      Physical Exam:    VS:  There were no vitals taken for this visit.    Wt Readings from Last 3 Encounters:  10/06/20 74.8 kg  08/20/20 77.1 kg  08/14/20 77.1 kg     Affect appropriate Older looking than stated age  65: normal Neck supple with no adenopathy JVP normal no bruits no thyromegaly Lungs clear with no wheezing and good diaphragmatic motion Heart:  S1/S2 no murmur, no rub, gallop or click PMI enlarged post sternotomy  Abdomen: benighn, BS positve, no tenderness, no AAA no bruit.  No HSM or HJR Distal pulses intact with no bruits No edema Neuro aphasic improved no other focal signs  Skin warm and dry No muscular weakness    ASSESSMENT & PLAN:    1. HFrEF (heart failure with reduced  ejection fraction) (HCC) TTE 09/11/20 EF better than that at [facility] in December 30-35% continue GDMT       2. History of stroke With his reduced EF and probable embolic stroke, he was placed on Apixaban for anticoagulation along with ASA by [facility] cardiology. Presented with dysarthria and AMS CT with left parieto-occipital infarct. No TPA or thrombectomy done CTA no carotid disease F/U neurology primary for OT/PT speech Rx Don't think their is a reason to do TEE at this point Would have preferred ILR rather than patch monitor Monitor with no PAF   3. Coronary artery disease  involving native coronary artery of native heart without angina pectoris History of CABG in 2000.  Cardiac catheterization 2017 demonstrated patent bypass grafts ( SVG D1, OM/IM and LIMA to LAD and complete occlusion of the RCA. RCA had both right to right and left to right collaterals   He has been managed medically.  It had been recommended that CTO PCI could be considered of the RCA if he had refractory angina.  Would like to avoid repeat cath given recent stroke  Myovue 08/20/20 no ischemia with old IMI and no change in perfusion from 2017  4. Renal cell carcinoma of left kidney (HCC) As noted, he needs to undergo left nephrectomy  Surgery will be delayed in setting of recent stroke and newly depressed EF ImmunoRx started at [facility]   5. Type 2 diabetes mellitus with other specified complication, without long-term current use of insulin (HCC) Back on Ozempic f/u primary target A1c < 6.5  6. Essential hypertension The patient's blood pressure is controlled on his current regimen.  Continue current therapy. At f/u, consider DC Amlodipine and increasing dose of Entresto.       Dispo:  F/U in 6 months   Medication Adjustments/Labs and Tests Ordered: Current medicines are reviewed at length with the patient today.  Concerns regarding medicines are outlined above.  Tests Ordered:   Medication Changes: No orders of the defined  types were placed in this encounter.   Signed, Jenkins Rouge, MD  10/08/2020 11:00 AM    [facility] [place], [place], [place]  [address] Phone: [phone]; Fax: [phone]

## 2020-10-02 NOTE — Telephone Encounter (Signed)
Semaglutide,0.25 or 0.5MG /DOS, (OZEMPIC, 0.25 OR 0.5 MG/DOSE,) 2 MG/1.5ML [facility] has questions about the medication above and the dosing   Call at [phone] Ref number [id number]

## 2020-10-02 NOTE — Telephone Encounter (Signed)
Caller states on the order for Ozempic Pen. it has to administer 0.25mg  1 per week x28 and then 0.5mg  1 per week x14 days Needs to know what the final dose will be. If it is 1mg  weekly, then they will need a new order written for this.

## 2020-10-03 NOTE — Telephone Encounter (Signed)
Was unable to reach by phone, mychart message has been sent.

## 2020-10-06 ENCOUNTER — Encounter: Payer: Self-pay | Admitting: Internal Medicine

## 2020-10-06 ENCOUNTER — Other Ambulatory Visit: Payer: Self-pay

## 2020-10-06 ENCOUNTER — Other Ambulatory Visit: Payer: Self-pay | Admitting: Family Medicine

## 2020-10-06 ENCOUNTER — Ambulatory Visit (INDEPENDENT_AMBULATORY_CARE_PROVIDER_SITE_OTHER): Payer: BC Managed Care – PPO | Admitting: Internal Medicine

## 2020-10-06 DIAGNOSIS — Z8673 Personal history of transient ischemic attack (TIA), and cerebral infarction without residual deficits: Secondary | ICD-10-CM | POA: Insufficient documentation

## 2020-10-06 DIAGNOSIS — I428 Other cardiomyopathies: Secondary | ICD-10-CM

## 2020-10-06 DIAGNOSIS — I639 Cerebral infarction, unspecified: Secondary | ICD-10-CM

## 2020-10-06 NOTE — Progress Notes (Signed)
HPI Larry Garner is referred by Dr. Johnsie Cancel for consideration for an ILR insertion. He has a complicated medical history. He underwent CABG over 15 years ago. He has mild LV dysfunction until presenting with a stroke several months ago. He was found to have a drop in his LV function with an EF of 25%. He was placed on guideline directed medical therapy. He was found to have a renal cell CA and is pending nephrectomy. He has chronic dyspnea and has noted some recent peripheral edema. He denies chest pain. He has class 2 dyspnea.  Allergies  Allergen Reactions   Morphine Sulfate Nausea And Vomiting and Swelling     Current Outpatient Medications  Medication Sig Dispense Refill   albuterol (VENTOLIN HFA) 108 (90 Base) MCG/ACT inhaler Inhale 2 puffs into the lungs every 6 (six) hours as needed for wheezing or shortness of breath. 18 g 11   ALPRAZolam (XANAX) 0.5 MG tablet Take 1 tablet (0.5 mg total) by mouth every 6 (six) hours as needed. 30 tablet 3   amLODipine (NORVASC) 5 MG tablet TAKE 1 TABLET BY MOUTH EVERY DAY 90 tablet 1   apixaban (ELIQUIS) 5 MG TABS tablet Take 1 tablet (5 mg total) by mouth in the morning and at bedtime. 180 tablet 3   Azelastine-Fluticasone 137-50 MCG/ACT SUSP Place 1 spray into the nose daily as needed (allergies).      bisoprolol (ZEBETA) 10 MG tablet TAKE 1 TABLET BY MOUTH EVERY DAY 90 tablet 3   cyanocobalamin 1000 MCG tablet Take 1,000 mcg by mouth daily.     empagliflozin (JARDIANCE) 10 MG TABS tablet Take 1 tablet (10 mg total) by mouth daily before breakfast. 30 tablet 0   famciclovir (FAMVIR) 500 MG tablet TAKE 3 TABLETS BY MOUTH EVERY DAY AS NEEDED AT FIRST SIGN OF OUTBREAK OF FEVER BLISTER 15 tablet 1   folic acid (FOLVITE) 638 MCG tablet Take 1,600 mcg by mouth daily.     furosemide (LASIX) 40 MG tablet Take 40mg  twice daily for 3 days followed by once daily 60 tablet 1   glucose blood (BAYER CONTOUR NEXT TEST) test strip Use to test  blood sugars daily. Dx: E11.9 100 each 12   metFORMIN (GLUCOPHAGE) 1000 MG tablet TAKE 1 TABLET (1,000 MG TOTAL) BY MOUTH 2 (TWO) TIMES DAILY WITH A MEAL. 180 tablet 1   Multiple Vitamin (MULTIVITAMIN) tablet Take 1 tablet by mouth daily.     nitroGLYCERIN (NITROSTAT) 0.4 MG SL tablet PLACE 1 TABLET UNDER THE TONGUE EVERY 5 MINUTES AS NEEDED FOR CHEST PAIN. 25 tablet 1   oxymetazoline (AFRIN) 0.05 % nasal spray Place 1 spray into both nostrils 2 (two) times daily as needed for congestion.     rosuvastatin (CRESTOR) 20 MG tablet TAKE 1 TABLET BY MOUTH DAILY 90 tablet 4   sacubitril-valsartan (ENTRESTO) 24-26 MG Take 1 tablet by mouth 2 (two) times daily. 90 tablet 2   Semaglutide,0.25 or 0.5MG /DOS, (OZEMPIC, 0.25 OR 0.5 MG/DOSE,) 2 MG/1.5ML SOPN Inject 0.25 mg as directed once a week for 28 days, THEN 0.5 mg once a week for 14 days. 1.5 mL 3   spironolactone (ALDACTONE) 25 MG tablet Take 0.5 tablets (12.5 mg total) by mouth daily. 45 tablet 3   zaleplon (SONATA) 10 MG capsule Take 1 capsule (10 mg total) by mouth at bedtime as needed. for sleep 30 capsule 5   No current facility-administered medications for this visit.     Past Medical History:  Diagnosis Date   Allergic rhinitis    Allergy    Anxiety    Asthma    CHF (congestive heart failure) (HCC)    Diabetes ([place])    Diverticulitis    Dyspnea    GERD (gastroesophageal reflux disease)    very occ   History of heart attack    HLD (hyperlipidemia)    HTN (hypertension)    Myocardial infarction ([place]) 2000   Sleep apnea    wears cpap    Tubular adenoma of colon 09/2008    ROS:   All systems reviewed and negative except as noted in the HPI.   Past Surgical History:  Procedure Laterality Date   CARDIAC CATHETERIZATION N/A 07/20/2016   Procedure: Left Heart Cath and Cors/Grafts Angiography;  Surgeon: Belva Crome, MD;  Location: [facility];  Service: Cardiovascular;  Laterality: N/A;    CATARACT EXTRACTION Bilateral 06/06/2019   Dr. Tommy Rainwater. oct left, dec right 2020    COLONOSCOPY     CORONARY ANGIOPLASTY     CORONARY ARTERY BYPASS GRAFT  06/1999   POLYPECTOMY     RENAL BIOPSY  06/2020   TONSILLECTOMY     late50's early 30's     Family History  Problem Relation Age of Onset   Hypertension Mother    Alzheimer's disease Mother    Colon polyps Mother    Hyperlipidemia Father    Hypertension Father    Colon polyps Father    Colon cancer Paternal Aunt    Pancreatic cancer Neg Hx    Stomach cancer Neg Hx    Thyroid disease Neg Hx    Esophageal cancer Neg Hx    Rectal cancer Neg Hx      Social History   Socioeconomic History   Marital status: Married    Spouse name: Not on file   Number of children: Not on file   Years of education: Not on file   Highest education level: Not on file  Occupational History   Occupation: Banker  Tobacco Use   Smoking status: Former Smoker    Packs/day: 0.50    Years: 4.00    Pack years: 2.00    Types: Cigarettes    Quit date: 09/29/1977    Years since quitting: 43.0   Smokeless tobacco: Never Used   Tobacco comment: quit on 1980  Vaping Use   Vaping Use: Never used  Substance and Sexual Activity   Alcohol use: Not Currently   Drug use: No   Sexual activity: Yes  Other Topics Concern   Not on file  Social History Narrative   Married  In 1981 with 2 kids (son and daughter). No grandkids.       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)      Hobbies: active in church, sings for church, travel   Social Determinants of Radio broadcast assistant Strain: Not on [employer] Insecurity: Not on file  Transportation Needs: Not on file  Physical Activity: Not on file  Stress: Not on file  Social Connections: Not on file  Intimate Partner Violence: Not on file     BP (!) 100/50 (BP Location: Left Arm, Patient Position: Sitting, Cuff Size: Normal)    Pulse 74    Ht 5\' 11"  (1.803 m)    Wt 165  lb (74.8 kg)    SpO2 98%    BMI 23.01 kg/m   Physical Exam:  Well appearing NAD HEENT: Unremarkable Neck:  No JVD, no  thyromegally Lymphatics:  No adenopathy Back:  No CVA tenderness Lungs:  Clear with no wheezes HEART:  Regular rate rhythm, no murmurs, no rubs, no clicks Abd:  soft, positive bowel sounds, no organomegally, no rebound, no guarding Ext:  2 plus pulses, no edema, no cyanosis, no clubbing Skin:  No rashes no nodules Neuro:  CN II through XII intact, motor grossly intact  EKG - reviewed. NSR with RBBB and left axis and first degree AV block   Assess/Plan: 1. Cryptogenic stroke - the etiology is unclear though I think it likely from the LV apex. He has been placed on systemic anti-coagulation which I agree with.  2. Chronic systolic heart failure - he has class symptoms and I have recommended he continue GDMT and lasix. His repeat echo was improved. I would recommend another echo in 3 months. If his EF remains down and his likelihood of cure from CA is good, then I would recommend an ICD. 3. CAD - he denies anginal symptoms. Cath reviewed 4. Preoperative eval - from a cardiac perspective, he is an acceptable moderate risk for the nephrectomy. I suspect that the surgery team will want to get him 3 months out from the stroke.   Larry Overlie Fedrick Cefalu,MD

## 2020-10-06 NOTE — Patient Instructions (Addendum)
Medication Instructions:  Your physician recommends that you continue on your current medications as directed. Please refer to the Current Medication list given to you today.  Labwork: None ordered.  Testing/Procedures: Your physician has requested that you have an echocardiogram. Echocardiography is a painless test that uses sound waves to create images of your heart. It provides your doctor with information about the size and shape of your heart and how well your heart's chambers and valves are working. This procedure takes approximately one hour. There are no restrictions for this procedure.  Please schedule for ECHO beginning of May 2022  Follow-Up: Your physician wants you to follow-up in: May 2022 after ECHO with Cristopher Peru, MD.   Any Other Special Instructions Will Be Listed Below (If Applicable).  If you need a refill on your cardiac medications before your next appointment, please call your pharmacy.

## 2020-10-07 ENCOUNTER — Ambulatory Visit: Payer: BC Managed Care – PPO | Attending: Family Medicine | Admitting: Physical Therapy

## 2020-10-07 ENCOUNTER — Encounter: Payer: Self-pay | Admitting: Physical Therapy

## 2020-10-07 ENCOUNTER — Ambulatory Visit: Payer: BC Managed Care – PPO

## 2020-10-07 ENCOUNTER — Other Ambulatory Visit: Payer: Self-pay

## 2020-10-07 DIAGNOSIS — R41844 Frontal lobe and executive function deficit: Secondary | ICD-10-CM | POA: Insufficient documentation

## 2020-10-07 DIAGNOSIS — I69854 Hemiplegia and hemiparesis following other cerebrovascular disease affecting left non-dominant side: Secondary | ICD-10-CM | POA: Insufficient documentation

## 2020-10-07 DIAGNOSIS — R278 Other lack of coordination: Secondary | ICD-10-CM | POA: Insufficient documentation

## 2020-10-07 DIAGNOSIS — R4701 Aphasia: Secondary | ICD-10-CM | POA: Diagnosis present

## 2020-10-07 DIAGNOSIS — M6281 Muscle weakness (generalized): Secondary | ICD-10-CM

## 2020-10-07 DIAGNOSIS — R41841 Cognitive communication deficit: Secondary | ICD-10-CM

## 2020-10-07 DIAGNOSIS — R2681 Unsteadiness on feet: Secondary | ICD-10-CM | POA: Insufficient documentation

## 2020-10-07 DIAGNOSIS — R41840 Attention and concentration deficit: Secondary | ICD-10-CM | POA: Diagnosis present

## 2020-10-07 DIAGNOSIS — E1169 Type 2 diabetes mellitus with other specified complication: Secondary | ICD-10-CM

## 2020-10-07 DIAGNOSIS — R2689 Other abnormalities of gait and mobility: Secondary | ICD-10-CM | POA: Diagnosis present

## 2020-10-07 NOTE — Therapy (Signed)
[facility] [address] [place], [place], [address] Phone: [phone]   Fax:  [phone]  Speech Language Pathology Treatment  Patient Details  Name: Larry Garner MRN: [id number] Date of Birth: 10/08/1955 Referring Provider (SLP): Teresa Coombs, [place]   Encounter Date: 10/07/2020   End of Session - 10/07/20 1308    Visit Number 3    Number of Visits 17    Date for SLP Re-Evaluation 12/05/20    SLP Start Time 1151    SLP Stop Time  1231    SLP Time Calculation (min) 40 min    Activity Tolerance Patient tolerated treatment well           Past Medical History:  Diagnosis Date  . Allergic rhinitis   . Allergy   . Anxiety   . Asthma   . CHF (congestive heart failure) ([place])   . Diabetes ([place])   . Diverticulitis   . Dyspnea   . GERD (gastroesophageal reflux disease)    very occ  . History of heart attack   . HLD (hyperlipidemia)   . HTN (hypertension)   . Myocardial infarction ([place]) 2000  . Sleep apnea    wears cpap   . Tubular adenoma of colon 09/2008    Past Surgical History:  Procedure Laterality Date  . CARDIAC CATHETERIZATION N/A 07/20/2016   Procedure: Left Heart Cath and Cors/Grafts Angiography;  Surgeon: Belva Crome, MD;  Location: [facility];  Service: Cardiovascular;  Laterality: N/A;  . CATARACT EXTRACTION Bilateral 06/06/2019   Dr. Tommy Rainwater. oct left, dec right 2020   . COLONOSCOPY    . CORONARY ANGIOPLASTY    . CORONARY ARTERY BYPASS GRAFT  06/1999  . POLYPECTOMY    . RENAL BIOPSY  06/2020  . TONSILLECTOMY     late50's early 60's    There were no vitals filed for this visit.   Subjective Assessment - 10/07/20 1155    Subjective "He's doing better with logic links game"    Patient is accompained by: Family member   Larry Garner   Currently in Pain? No/denies                 ADULT SLP TREATMENT - 10/07/20 1156      General Information   Behavior/Cognition  Alert;Cooperative;Pleasant mood      Treatment Provided   Treatment provided Cognitive-Linquistic      Cognitive-Linquistic Treatment   Treatment focused on Aphasia;Patient/family/caregiver education    Skilled Treatment "Those times (of anomia) seem lke they are fewer and further between." Pt states longer explanations bring out more difficulties - wife gave example of visit with MD yesterday. Pt's explanation of this visit was with one dysnomic episode which pt was aware of ("excl--"/explanation). Pt endorsed with wife's agreement that sometimes he forgets "the lingo", so SLP introduced semantic feature analysis (SFA) to pt and wife with example of "immunotherapy." SLP explained rationale for SFA SLP encouraged pt and wife to do this process with all "lingo" words pt has difficulty with generating, adn reviewing these prior to MD visits. SLP told pt to write down the things he needs to communicate with MDs with bullet points and generate some phrases/sentences for each one so that he can first, practice explanations with vocabulary, and then take the sheet if he feels it's necessary, to assist his explanation in the MD visit itself. SLP suggested pt write appointments on a central calendar so he can access  Assessment / Recommendations / Plan   Plan Continue with current plan of care            SLP Education - 10/07/20 1307    Education Details semantic feature analysis, rationale,    Person(s) Educated Patient;Spouse    Methods Explanation;Demonstration;Handout    Comprehension Verbalized understanding;Returned demonstration;Verbal cues required;Need further instruction            SLP Short Term Goals - 10/07/20 1309      SLP SHORT TERM GOAL #1   Title pt will name 8 items in abstract categories in 5/6 opportunities in 3 sessions    Time 3    Period Weeks    Status On-going      SLP SHORT TERM GOAL #2   Title pt will describe to SLP 3 anomia compensations    Time 3     Period Weeks    Status On-going      SLP SHORT TERM GOAL #3   Title pt will verbally sequence pertinent routines for pt with modified independence in 3 sessions    Time 3    Period Weeks    Status On-going      SLP SHORT TERM GOAL #4   Title pt will participate functionally in 10 minutes mod complex conversation using compensations for anomia successfully    Time 3    Period Weeks    Status On-going      SLP SHORT TERM GOAL #5   Title pt will participate in further testing as necessary (WAB-R or if pt desires, CLQT)    Time 1    Period Weeks   or 5 total sessions   Status On-going            SLP Long Term Goals - 10/07/20 1309      SLP LONG TERM GOAL #1   Title pt will participate functionally in 20 minutes mod complex/complex conversation using compensations for anomia successfully in 3 sessions    Time 7    Period Weeks   or 17 total sessions, for all LTGs   Status On-going      SLP LONG TERM GOAL #2   Title pt will express numbers with 100% success with self correction in 3 sessions    Time 7    Period Weeks    Status On-going      SLP LONG TERM GOAL #3   Title pt will listen to a 20 minute discourse (lecture, TED talk, etc) and functionally give a verbal synopsis with compensations (notes, anomia compensations, etc) in 3 sessions    Time 7    Period Weeks    Status On-going            Plan - 10/07/20 1308    Clinical Impression Statement Larry Garner presents today with reduced word finding ability as reported and observed today in evaluation, his [employer] score is only slightly above the mean. 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 Pt also with reported attentional deficits described as inability to pay attention to long discourse now compared to pre-morbidly. Additionally pt states he may struggle with managin his own medication at this time,  indicating reduced organization/critical thinking skills. Today SLP introduced semantic feature analysis.  SLP believes pt would benefit from skilled ST to target expressive aphasia and if pt desires, cognitive communication/attention skills. He  and wife deny swallowing difficulties during meals.    Speech Therapy Frequency 2x / week    Duration 8 weeks   17 visits   Treatment/Interventions Language facilitation;Cueing hierarchy;SLP instruction and feedback;Compensatory strategies;Patient/family education;Internal/external aids;Cognitive reorganization;Multimodal communcation approach;Functional tasks    Potential to Achieve Goals Good    Consulted and Agree with Plan of Care Patient           Patient will benefit from skilled therapeutic intervention in order to improve the following deficits and impairments:   Aphasia  Cognitive communication deficit    Problem List Patient Active Problem List   Diagnosis Date Noted  . Nonischemic cardiomyopathy ([place]) 10/06/2020  . Cryptogenic stroke ([place]) 10/06/2020  . Acute on chronic combined systolic and diastolic CHF (congestive heart failure) ([place]) 07/17/2020  . Renal cell carcinoma ([place]) 07/17/2020  . Protein-calorie malnutrition, severe 07/08/2020  . Acute respiratory failure with hypoxia ([place]) 07/05/2020  . Iron deficiency anemia due to chronic blood loss 06/19/2020  . OSA (obstructive sleep apnea) 01/09/2018  . Pulmonary nodule, left 03/15/2017  . Former smoker 11/04/2014  . Insomnia 05/27/2014  . Plantar wart of left foot 11/27/2010  . Type 2 diabetes mellitus with other specified complication ([place]) 02/06/1600  . PSA, INCREASED 09/15/2009  . Diverticulitis of colon 05/08/2009  . Asthma, moderate persistent 06/19/2008  . ANKLE PAIN, CHRONIC 06/05/2007  . Allergic rhinitis 05/17/2007  . ERECTILE DYSFUNCTION, SECONDARY TO MEDICATION 05/17/2007  . Hyperlipidemia 04/06/2007  . Anxiety state 04/06/2007  . Essential hypertension  04/06/2007  . Coronary artery disease involving native coronary artery of native heart without angina pectoris 04/06/2007    [facility] ,MS, CCC-SLP  10/07/2020, 1:10 PM  [facility] [address] [place], [place], [address] Phone: [phone]   Fax:  [phone]   Name: Larry Garner MRN: [id number] Date of Birth: 1956-03-15

## 2020-10-07 NOTE — Telephone Encounter (Signed)
Pt called and got scheduled. Please place orders

## 2020-10-07 NOTE — Therapy (Signed)
[facility] [address] [place], [place], [address] Phone: [phone]   Fax:  [phone]  Physical Therapy Treatment  Patient Details  Name: Larry Garner MRN: [id number] Date of Birth: 1955-10-03 Referring Provider (PT): Redmond, Bea Laura, NP   Encounter Date: 10/07/2020   PT End of Session - 10/07/20 1421    Visit Number 12    Number of Visits 17    Date for PT Re-Evaluation 11/25/20    Authorization Type BCBS, Medicare    PT Start Time 1102    PT Stop Time 1144    PT Time Calculation (min) 42 min    Equipment Utilized During Treatment Gait belt    Activity Tolerance Patient tolerated treatment well    Behavior During Therapy [facility] for tasks assessed/performed           Past Medical History:  Diagnosis Date  . Allergic rhinitis   . Allergy   . Anxiety   . Asthma   . CHF (congestive heart failure) ([place])   . Diabetes ([place])   . Diverticulitis   . Dyspnea   . GERD (gastroesophageal reflux disease)    very occ  . History of heart attack   . HLD (hyperlipidemia)   . HTN (hypertension)   . Myocardial infarction ([place]) 2000  . Sleep apnea    wears cpap   . Tubular adenoma of colon 09/2008    Past Surgical History:  Procedure Laterality Date  . CARDIAC CATHETERIZATION N/A 07/20/2016   Procedure: Left Heart Cath and Cors/Grafts Angiography;  Surgeon: Belva Crome, MD;  Location: [facility];  Service: Cardiovascular;  Laterality: N/A;  . CATARACT EXTRACTION Bilateral 06/06/2019   Dr. Tommy Rainwater. oct left, dec right 2020   . COLONOSCOPY    . CORONARY ANGIOPLASTY    . CORONARY ARTERY BYPASS GRAFT  06/1999  . POLYPECTOMY    . RENAL BIOPSY  06/2020  . TONSILLECTOMY     late50's early 60's    There were no vitals filed for this visit.   Subjective Assessment - 10/07/20 1105    Subjective Pt's wife beth asking about a stationary peddle bike. Waiting to hear back from the cardiologist to see if he  can have his nephrectomy surgery - will find out tomorrow, sees the cardiologist tomorrow morning.    Patient is accompained by: Family member   beth   Pertinent History L MCA CVA, Coronary Artery Disease, Heart Failure, Hyperlipidemia, Osteoarthritis, GERD, Renal Cell Cancer requiring nephrectomy (active)    Limitations Standing;Walking;Other (comment)   stairs   How long can you walk comfortably? approx. 100' with RW    Patient Stated Goals go up and down the stairs by himself, find out what his new normal will be, walk without the RW    Currently in Pain? No/denies                             [facility] Adult PT Treatment/Exercise - 10/07/20 0001      Ambulation/Gait   Ambulation/Gait Yes    Ambulation/Gait Assistance 4: Min guard;5: Supervision    Ambulation/Gait Assistance Details with head turns/nods - pt with more difficulty with head nods, and quick stops/ restarting gait, cues to relax LUE during gait for more natural arm swing    Ambulation Distance (Feet) 230 Feet    Assistive device Straight cane    Gait Pattern Step-through pattern  Ambulation Surface Indoor;Level    Gait Comments over blue/red compliant mats down and back 1 rep, then added 4 obstacles down and back x2 reps (2 obstacles on level ground), 2 on unlevel surfaces - with pt needing intermittent cueing for proper sequencing for safety when stepping over obstacles with cane, needing intermittent min guard for balance      Therapeutic Activites    Therapeutic Activities Other Therapeutic Activities    Other Therapeutic Activities pt's wife asking about stationary peddle bike for use at home, brought out clinic bike and showed patient how to use with peddling forwards/backwards, discussed that this would be a good idea for home in order to build up BLE strength, pt's wife to buy one from [employer] for home               Balance Exercises - 10/07/20 0001      Balance Exercises: Standing   SLS with  Vectors Upper extremity assist 2;Foam/compliant surface    SLS with Vectors Limitations on blue air ex, alternating forward taps to cones x10 reps B, needing BUE support    Stepping Strategy Lateral;Foam/compliant surface;UE support;10 reps    Stepping Strategy Limitations with no UE support, cues for incr foot clearane when stepping back on   Rockerboard Lateral;EO    Rockerboard Limitations weight shifting R/L x15 reps, keeping board still head nods 2 x 5 reps with intermittent touch to bars and min guard               PT Short Term Goals - 09/24/20 1157      PT SHORT TERM GOAL #1   Title Pt will be independent with initial HEP and verbalize understanding of decr fall risk in the home. ALL STGS DUE 09/24/20    Baseline Independent    Time 4    Period Weeks    Status Achieved    Target Date 09/24/20      PT SHORT TERM GOAL #2   Title Pt will improve BERG to at least a 49/56 in order to demo decr fall risk.    Baseline BERG 44/56, 50/56 on 09/22/20    Time 4    Period Weeks    Status Achieved      PT SHORT TERM GOAL #3   Title Pt will perform 5x sit <> stand in 20 seconds or less with single UE support in order to demo incr balance.    Baseline 25 seconds, 24.78 with BUE support from arm chair, 4/10 RPE afterwards on 09/22/20    Time 4    Period Weeks    Status Not Met      PT SHORT TERM GOAL #4   Title Pt will improve gait speed with RW vs. LRAD to at least 2.4 ft/sec in order to demo improved community mobility.    Baseline 2.00 ft/sec; 1.60 ft/sec with LRAD (SPC)    Time 4    Period Weeks    Status Not Met      PT SHORT TERM GOAL #5   Title Pt will perform TUG with no AD in 17 seconds or less in order to demo decr fall risk.    Baseline 20.63 seconds, 17.5 seconds with SPC with quad tip on 09/22/20    Time 4    Period Weeks    Status Not Met             PT Long Term Goals - 08/27/20 1516      PT  LONG TERM GOAL #1   Title Pt will be independent with final HEP  in order to build upon functional gains made in therapy. ALL LTGS DUE 10/22/20    Time 8    Period Weeks    Status New    Target Date 10/22/20      PT LONG TERM GOAL #2   Title BERG/DGI goal to be written as appropriate.    Time 8    Period Weeks    Status New      PT LONG TERM GOAL #3   Title Pt will improve gait speed with LRAD vs. no AD to at least 2.7 ft/sec in order to demo improved community mobility.    Baseline 2.00 ft/sec    Time 8    Period Weeks    Status New      PT LONG TERM GOAL #4   Title Pt will perform TUG with no AD in 13.5 seconds or less in order to demo decr fall risk.    Baseline 20.63 seconds    Time 8    Period Weeks    Status New      PT LONG TERM GOAL #5   Title Pt will ambulate at least 500' with supervision over indoor/outdoor surfaces with LRAD in order to demo improved community mobility.    Time 8    Period Weeks    Status New      Additional Long Term Goals   Additional Long Term Goals Yes      PT LONG TERM GOAL #6   Title Pt will ambulate 12 steps with single handrail and step to vs. step through pattern with supervision in order to safely go up/down the stairs in his house.    Baseline 4 steps B handrails step through with CGA    Time 8    Period Weeks    Status New                 Plan - 10/07/20 1422    Clinical Impression Statement With dynamic gait training with SPC with quad tip, pt needing min guard at times when performing head nods up and down. Did better with head turns. Pt needing verbal cues for proper sequencing with obstacle negotiation for cane placement, pt with tendency to leave cane behind and need min guard for balance. Pt tolerated session well, will continue to progress towards LTGs.    Personal Factors and Comorbidities Comorbidity 3+;Past/Current Experience    Comorbidities L MCA CVA, Coronary Artery Disease, Heart Failure, Hyperlipidemia, Osteoarthritis, GERD, Renal Cell Cancer requiring nephrectomy (active)     Examination-Activity Limitations Locomotion Level;Stand;Transfers;Stairs    Examination-Participation Restrictions Community Activity;Occupation   work (is a Customer service manager)   [employer] Stable/Uncomplicated    Rehab Potential Good    PT Frequency 2x / week    PT Duration 8 weeks    PT Treatment/Interventions ADLs/Self Care Home Management;Stair training;Gait training;DME Instruction;Functional mobility training;Therapeutic activities;Therapeutic exercise;Balance training;Neuromuscular re-education;Patient/family education;Orthotic Fit/Training;Passive range of motion;Vestibular;Energy conservation    PT Next Visit Plan continue gait training with SPC w/ quad tip - obstacle negotiation. balance on compliant surfaces. balance with head motions, esp nods. RLE strength. Scifit for aerobic warm up    PT Home Exercise Plan added eccentric sitting to HEP, 5 reps per set    Consulted and Agree with Plan of Care Patient;Family member/caregiver    Family Member Consulted wife, [name]           Patient  will benefit from skilled therapeutic intervention in order to improve the following deficits and impairments:  Abnormal gait,Decreased activity tolerance,Decreased balance,Decreased endurance,Decreased knowledge of use of DME,Decreased range of motion,Decreased strength,Difficulty walking,Decreased safety awareness  Visit Diagnosis: Muscle weakness (generalized)  Unsteadiness on feet  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 Nonischemic cardiomyopathy ([place]) 10/06/2020  . Cryptogenic stroke ([place]) 10/06/2020  . Acute on chronic combined systolic and diastolic CHF (congestive heart failure) ([place]) 07/17/2020  . Renal cell carcinoma ([place]) 07/17/2020  . Protein-calorie malnutrition, severe 07/08/2020  . Acute  respiratory failure with hypoxia ([place]) 07/05/2020  . Iron deficiency anemia due to chronic blood loss 06/19/2020  . OSA (obstructive sleep apnea) 01/09/2018  . Pulmonary nodule, left 03/15/2017  . Former smoker 11/04/2014  . Insomnia 05/27/2014  . Plantar wart of left foot 11/27/2010  . Type 2 diabetes mellitus with other specified complication ([place]) 18/28/8337  . PSA, INCREASED 09/15/2009  . Diverticulitis of colon 05/08/2009  . Asthma, moderate persistent 06/19/2008  . ANKLE PAIN, CHRONIC 06/05/2007  . Allergic rhinitis 05/17/2007  . ERECTILE DYSFUNCTION, SECONDARY TO MEDICATION 05/17/2007  . Hyperlipidemia 04/06/2007  . Anxiety state 04/06/2007  . Essential hypertension 04/06/2007  . Coronary artery disease involving native coronary artery of native heart without angina pectoris 04/06/2007    Arliss Journey, PT, DPT  10/07/2020, 2:28 PM  [facility] [address] [place], [place], [address] Phone: [phone]   Fax:  [phone]  Name: Larry Garner MRN: [id number] Date of Birth: 1955-09-30

## 2020-10-07 NOTE — Telephone Encounter (Signed)
Orders placed.

## 2020-10-07 NOTE — Patient Instructions (Signed)
Semantic feature analysis sheet given

## 2020-10-08 ENCOUNTER — Encounter: Payer: Self-pay | Admitting: Cardiovascular Disease

## 2020-10-08 ENCOUNTER — Ambulatory Visit (INDEPENDENT_AMBULATORY_CARE_PROVIDER_SITE_OTHER): Payer: BC Managed Care – PPO | Admitting: Cardiovascular Disease

## 2020-10-08 ENCOUNTER — Other Ambulatory Visit: Payer: Self-pay

## 2020-10-08 VITALS — BP 112/52 | HR 73 | Ht 71.0 in | Wt 164.0 lb

## 2020-10-08 DIAGNOSIS — Z01810 Encounter for preprocedural cardiovascular examination: Secondary | ICD-10-CM | POA: Diagnosis not present

## 2020-10-08 DIAGNOSIS — I255 Ischemic cardiomyopathy: Secondary | ICD-10-CM | POA: Diagnosis not present

## 2020-10-08 MED ORDER — FUROSEMIDE 40 MG PO TABS: 40.0000 mg | ORAL_TABLET | Freq: Every day | ORAL | 3 refills | Status: DC

## 2020-10-08 MED ORDER — ENTRESTO 49-51 MG PO TABS: 1.0000 | ORAL_TABLET | Freq: Two times a day (BID) | ORAL | 11 refills | Status: DC

## 2020-10-08 NOTE — Patient Instructions (Addendum)
Medication Instructions:  Your physician has recommended you make the following change in your medication:  1.STOP Norvasc 2.INCREASE Entresto 49/51 mg by mouth twice daily.  *If you need a refill on your cardiac medications before your next appointment, please call your pharmacy*  Lab Work: If you have labs (blood work) drawn today and your tests are completely normal, you will receive your results only by: Marland Kitchen MyChart Message (if you have MyChart) OR . A paper copy in the mail If you have any lab test that is abnormal or we need to change your treatment, we will call you to review the results.  Testing/Procedures: None ordered today.  Follow-Up: At [facility], you and your health needs are our priority.  As part of our continuing mission to provide you with exceptional heart care, we have created designated Provider Care Teams.  These Care Teams include your primary Cardiologist (physician) and Advanced Practice Providers (APPs -  Physician Assistants and Nurse Practitioners) who all work together to provide you with the care you need, when you need it.  We recommend signing up for the patient portal called "MyChart".  Sign up information is provided on this After Visit Summary.  MyChart is used to connect with patients for Virtual Visits (Telemedicine).  Patients are able to view lab/test results, encounter notes, upcoming appointments, etc.  Non-urgent messages can be sent to your provider as well.   To learn more about what you can do with MyChart, go to [email or web address].    Your next appointment:   3 month(s)  The format for your next appointment:   In Person  Provider:   You may see Jenkins Rouge, MD or one of the following Advanced Practice Providers on your designated Care Team:    Kathyrn Drown, NP

## 2020-10-08 NOTE — Addendum Note (Signed)
Addended by: Aris Georgia, Ranier Coach L on: 10/08/2020 11:28 AM   Modules accepted: Orders

## 2020-10-09 ENCOUNTER — Encounter: Payer: Self-pay | Admitting: Physical Therapy

## 2020-10-09 ENCOUNTER — Ambulatory Visit: Payer: BC Managed Care – PPO | Admitting: Physical Therapy

## 2020-10-09 ENCOUNTER — Encounter: Payer: Self-pay | Admitting: Occupational Therapy

## 2020-10-09 ENCOUNTER — Ambulatory Visit: Payer: BC Managed Care – PPO | Admitting: Occupational Therapy

## 2020-10-09 DIAGNOSIS — I69854 Hemiplegia and hemiparesis following other cerebrovascular disease affecting left non-dominant side: Secondary | ICD-10-CM

## 2020-10-09 DIAGNOSIS — R278 Other lack of coordination: Secondary | ICD-10-CM

## 2020-10-09 DIAGNOSIS — R2681 Unsteadiness on feet: Secondary | ICD-10-CM

## 2020-10-09 DIAGNOSIS — R2689 Other abnormalities of gait and mobility: Secondary | ICD-10-CM

## 2020-10-09 DIAGNOSIS — R41840 Attention and concentration deficit: Secondary | ICD-10-CM

## 2020-10-09 DIAGNOSIS — M6281 Muscle weakness (generalized): Secondary | ICD-10-CM

## 2020-10-09 NOTE — Therapy (Signed)
[facility] [address] [place], [place], [address] Phone: [phone]   Fax:  [phone]  Occupational Therapy Treatment  Patient Details  Name: Larry Garner MRN: [id number] Date of Birth: [birth year] Referring Provider (OT): Bea Laura Redmond   Encounter Date: 10/09/2020   OT End of Session - 10/09/20 1350    Visit Number 12    Number of Visits 20    Date for OT Re-Evaluation 11/06/20    Authorization Type BCBS and Medicare    Authorization Time Period week 2 of 5 from renewal    Authorization - Visit Number 12    Authorization - Number of Visits 20    Progress Note Due on Visit 20    OT Start Time 7591    OT Stop Time 1315    OT Time Calculation (min) 40 min    Activity Tolerance Patient tolerated treatment well    Behavior During Therapy [facility] for tasks assessed/performed           Past Medical History:  Diagnosis Date  . Allergic rhinitis   . Allergy   . Anxiety   . Asthma   . CHF (congestive heart failure) ([place])   . Diabetes ([place])   . Diverticulitis   . Dyspnea   . GERD (gastroesophageal reflux disease)    very occ  . History of heart attack   . HLD (hyperlipidemia)   . HTN (hypertension)   . Myocardial infarction ([place]) 2000  . Sleep apnea    wears cpap   . Tubular adenoma of colon 09/2008    Past Surgical History:  Procedure Laterality Date  . CARDIAC CATHETERIZATION N/A 07/20/2016   Procedure: Left Heart Cath and Cors/Grafts Angiography;  Surgeon: Belva Crome, MD;  Location: [facility];  Service: Cardiovascular;  Laterality: N/A;  . CATARACT EXTRACTION Bilateral 06/06/2019   Dr. Tommy Rainwater. oct left, dec right 2020   . COLONOSCOPY    . CORONARY ANGIOPLASTY    . CORONARY ARTERY BYPASS GRAFT  06/1999  . POLYPECTOMY    . RENAL BIOPSY  06/2020  . TONSILLECTOMY     late50's early 60's    There were no vitals filed for this visit.   Subjective Assessment - 10/09/20 1240     Subjective  I am cleared for surgery per the [facility] docs    Patient is accompanied by: Family member    Limitations active renal cell cancer. fall risk. no driving    Patient Stated Goals I have trouble with dates and months and ordinal things    Currently in Pain? No/denies                        OT Treatments/Exercises (OP) - 10/09/20 0001      ADLs   ADL Comments Highlighted remaining goals.  Patient did not routinely exercise prior to diagnosis CA/ Stroke.  Patient has been using digiflex to build hand strength, nad has used putty.  Patient has not done UE exercises with yellow resistive band.      Cognitive Exercises   Other Cognitive Exercises 1 Worked on following written instructions while doing a challenging physical task.  Patient did well with overt written information.  Worked in seated position while following verbal instructions to assess and challenge altenrating attention.  Patient had greater difficulty with this task.  Patient needed occasional verbal cueing for original instructions - sorting task.  Patient with slow response -  but over time/ with repetition responses became more accurate.  Discussed with patient and wife that when challenged - reducing external distractions would allow him to better concentrate.                  OT Education - 10/09/20 1349    Education Details reducing external distraction may help focused attention    Person(s) Educated Patient;Spouse    Methods Explanation;Demonstration;Verbal cues    Comprehension Verbalized understanding;Need further instruction            OT Short Term Goals - 09/29/20 1104      OT SHORT TERM GOAL #1   Title Pt will be independent with HEP 09/05/20    Time 3    Period Weeks    Status Achieved    Target Date 09/05/20      OT SHORT TERM GOAL #2   Title Pt will increase grip strength in LUE to 42 lbs or more for increase in functional use of LUE.    Baseline RUE 52.6 LUE 38.8    Time 3     Period Weeks    Status On-going   32.6 lbs LUE 09/29/20     OT SHORT TERM GOAL #3   Title Pt will perform simple warm meal prep with supervision for return to prior level of functioning    Time 3    Period Weeks    Status Achieved      OT SHORT TERM GOAL #4   Title Pt will increase standing tolerance to 10 minutes or greater for increasing overall endurance and activity tolerance    Time 3    Period Weeks    Status Achieved             OT Long Term Goals - 10/09/20 1352      OT LONG TERM GOAL #1   Title *Pt will be independent with updated HEP for UE proximal strengthening and grip strengthening 12/08/2020    Time 10    Period Weeks    Status On-going      OT LONG TERM GOAL #2   Title *Pt will increase grip strength in LUE to 48 lbs or greater for increase in overall functional use of LUE    Baseline RUE 52.6 LUE 38.8    Time 6    Period Weeks    Status On-going   32.6lbs LUE     OT LONG TERM GOAL #3   Title Pt will complete 9 hole peg test in 32 seconds or less with LUE for increase in fine motor coordination.    Baseline RUE 28.69, LUE 37.03    Time 6    Period Weeks    Status Achieved   24.34     OT LONG TERM GOAL #4   Title Pt will complete mod complex meal prep with mod I for increase in return to prior level of functioning and home management and meal prep tasks    Time 6    Period Weeks    Status Achieved   completed at home with increased time.     OT LONG TERM GOAL #5   Title Pt will tolerate 15 minutes of continuous standing activities for increase in overall standing tolerance and activity tolerance for completing IADLs.    Time 6    Period Weeks    Status Achieved   pt is completing standing for > 15 minutes with cooking at home etc     OT  LONG TERM GOAL #6   Title *Pt will complete physical and cognitive task simultaneously with 90% accuracy.    Time 6    Period Weeks    Status On-going   75-80% with diff continuing walking with thinking      OT LONG TERM GOAL #7   Title Complete FOTO at discharge    Time 6    Period Weeks    Status On-going                 Plan - 10/09/20 1351    Clinical Impression Statement Pt progressing with activity tolerance and attention, needs continued work to build strength, conditioning, and alternating attention, memory    OT Occupational Profile and History Problem Focused Assessment - Including review of records relating to presenting problem    Occupational performance deficits (Please refer to evaluation for details): IADL's;ADL's;Leisure    Body Structure / Function / Physical Skills ADL;Decreased knowledge of use of DME;Strength;GMC;Balance;UE functional use;IADL;ROM;Endurance;FMC;Coordination    Rehab Potential Good    Clinical Decision Making Limited treatment options, no task modification necessary    Comorbidities Affecting Occupational Performance: None    OT Frequency 2x / week    OT Duration Other (comment)   2x/week for 5 weeks after renewal.   OT Treatment/Interventions Self-care/ADL training;DME and/or AE instruction;Therapeutic activities;Therapeutic exercise;Functional Mobility Training;Energy conservation;Patient/family education    Plan continue progressing towards goals.    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 Nonischemic cardiomyopathy ([place]) 10/06/2020  . Cryptogenic stroke ([place]) 10/06/2020  . Acute on chronic combined systolic and diastolic CHF (congestive heart failure) ([place]) 07/17/2020  . Renal cell carcinoma ([place]) 07/17/2020  . Protein-calorie malnutrition, severe 07/08/2020  . Acute respiratory failure with hypoxia ([place]) 07/05/2020  . Iron deficiency anemia due to chronic blood loss 06/19/2020  . OSA (obstructive sleep apnea) 01/09/2018  . Pulmonary nodule, left 03/15/2017  . Former smoker 11/04/2014  . Insomnia 05/27/2014  . Plantar wart of left foot 11/27/2010  . Type 2 diabetes mellitus with other specified complication ([place]) 32/07/2481  . PSA, INCREASED 09/15/2009  . Diverticulitis of colon 05/08/2009  . Asthma, moderate persistent 06/19/2008  . ANKLE PAIN, CHRONIC 06/05/2007  . Allergic rhinitis 05/17/2007  . ERECTILE DYSFUNCTION, SECONDARY TO MEDICATION 05/17/2007  . Hyperlipidemia 04/06/2007  . Anxiety state 04/06/2007  . Essential hypertension 04/06/2007  . Coronary artery disease involving native coronary artery of native heart without angina pectoris 04/06/2007    Mariah Milling, OTR/L 10/09/2020, 1:53 PM  [facility] [address] [place], [place], [address] Phone: [phone]   Fax:  [phone]  Name: Larry Garner MRN: [id number] Date of Birth: 1956-07-29

## 2020-10-09 NOTE — Therapy (Signed)
[facility] [address] [place], [place], [address] Phone: [phone]   Fax:  [phone]  Physical Therapy Treatment  Patient Details  Name: Larry Garner MRN: [id number] Date of Birth: Aug 22, 1955 Referring Provider (PT): Redmond, Bea Laura, NP   Encounter Date: 10/09/2020   PT End of Session - 10/09/20 1201    Visit Number 13    Number of Visits 17    Date for PT Re-Evaluation 11/25/20    Authorization Type BCBS, Medicare    PT Start Time 1101    PT Stop Time 1141    PT Time Calculation (min) 40 min    Equipment Utilized During Treatment Gait belt    Activity Tolerance Patient tolerated treatment well    Behavior During Therapy [facility] for tasks assessed/performed           Past Medical History:  Diagnosis Date  . Allergic rhinitis   . Allergy   . Anxiety   . Asthma   . CHF (congestive heart failure) ([place])   . Diabetes ([place])   . Diverticulitis   . Dyspnea   . GERD (gastroesophageal reflux disease)    very occ  . History of heart attack   . HLD (hyperlipidemia)   . HTN (hypertension)   . Myocardial infarction ([place]) 2000  . Sleep apnea    wears cpap   . Tubular adenoma of colon 09/2008    Past Surgical History:  Procedure Laterality Date  . CARDIAC CATHETERIZATION N/A 07/20/2016   Procedure: Left Heart Cath and Cors/Grafts Angiography;  Surgeon: Belva Crome, MD;  Location: [facility];  Service: Cardiovascular;  Laterality: N/A;  . CATARACT EXTRACTION Bilateral 06/06/2019   Dr. Tommy Rainwater. oct left, dec right 2020   . COLONOSCOPY    . CORONARY ANGIOPLASTY    . CORONARY ARTERY BYPASS GRAFT  06/1999  . POLYPECTOMY    . RENAL BIOPSY  06/2020  . TONSILLECTOMY     late50's early 60's    There were no vitals filed for this visit.   Subjective Assessment - 10/09/20 1105    Subjective Cardiologist appt went well - cleared to go ahead with his surgery, just have to get it scheduled. Feeling good.  Got the stepper yesterday and tried it out and it went well.    Patient is accompained by: Family member   beth   Pertinent History L MCA CVA, Coronary Artery Disease, Heart Failure, Hyperlipidemia, Osteoarthritis, GERD, Renal Cell Cancer requiring nephrectomy (active)    Limitations Standing;Walking;Other (comment)   stairs   How long can you walk comfortably? approx. 100' with RW    Patient Stated Goals go up and down the stairs by himself, find out what his new normal will be, walk without the RW    Currently in Pain? No/denies                             [facility] Adult PT Treatment/Exercise - 10/09/20 1107      Transfers   Comments from 22.5" mat table: 2 x 5 reps on blue air ex, cues for slowed and controlled for eccentric lowering back to mat      Ambulation/Gait   Ambulation/Gait Yes    Ambulation/Gait Assistance 4: Min guard;5: Supervision    Ambulation/Gait Assistance Details with SPC with quad tip over pavement cues for incr foot clearance as RLE tends to scuff at times, needing min guard at  times for balance. pt and pt's spouse asking about walking outside in neighborhood, discussed using RW during outdoor gait for safety and energy efficiency    Ambulation Distance (Feet) 500 Feet    Assistive device Straight cane    Gait Pattern Step-through pattern    Ambulation Surface Level;Indoor;Outdoor;Paved      Knee/Hip Exercises: Aerobic   Other Aerobic Completed SciFit on Level 2.5 x 6 minutes with BUE/BLE for improved endurance/activity tolerance and strengthening.              Balance Exercises - 10/09/20 0001      Balance Exercises: Standing   Tandem Gait Forward;3 reps;Limitations    Tandem Gait Limitations down and back 3 reps at countertop with fingertip support    Step Over Hurdles / Cones stepping over 4 obstacles, 2 of larger and 2 smaller next to countertop, down and back x4 reps with UE support - reciprocal pattern, cues for incr hip/knee flexion  to clear it               PT Short Term Goals - 09/24/20 1157      PT SHORT TERM GOAL #1   Title Pt will be independent with initial HEP and verbalize understanding of decr fall risk in the home. ALL STGS DUE 09/24/20    Baseline Independent    Time 4    Period Weeks    Status Achieved    Target Date 09/24/20      PT SHORT TERM GOAL #2   Title Pt will improve BERG to at least a 49/56 in order to demo decr fall risk.    Baseline BERG 44/56, 50/56 on 09/22/20    Time 4    Period Weeks    Status Achieved      PT SHORT TERM GOAL #3   Title Pt will perform 5x sit <> stand in 20 seconds or less with single UE support in order to demo incr balance.    Baseline 25 seconds, 24.78 with BUE support from arm chair, 4/10 RPE afterwards on 09/22/20    Time 4    Period Weeks    Status Not Met      PT SHORT TERM GOAL #4   Title Pt will improve gait speed with RW vs. LRAD to at least 2.4 ft/sec in order to demo improved community mobility.    Baseline 2.00 ft/sec; 1.60 ft/sec with LRAD (SPC)    Time 4    Period Weeks    Status Not Met      PT SHORT TERM GOAL #5   Title Pt will perform TUG with no AD in 17 seconds or less in order to demo decr fall risk.    Baseline 20.63 seconds, 17.5 seconds with SPC with quad tip on 09/22/20    Time 4    Period Weeks    Status Not Met             PT Long Term Goals - 08/27/20 1516      PT LONG TERM GOAL #1   Title Pt will be independent with final HEP in order to build upon functional gains made in therapy. ALL LTGS DUE 10/22/20    Time 8    Period Weeks    Status New    Target Date 10/22/20      PT LONG TERM GOAL #2   Title BERG/DGI goal to be written as appropriate.    Time 8    Period Weeks  Status New      PT LONG TERM GOAL #3   Title Pt will improve gait speed with LRAD vs. no AD to at least 2.7 ft/sec in order to demo improved community mobility.    Baseline 2.00 ft/sec    Time 8    Period Weeks    Status New      PT LONG  TERM GOAL #4   Title Pt will perform TUG with no AD in 13.5 seconds or less in order to demo decr fall risk.    Baseline 20.63 seconds    Time 8    Period Weeks    Status New      PT LONG TERM GOAL #5   Title Pt will ambulate at least 500' with supervision over indoor/outdoor surfaces with LRAD in order to demo improved community mobility.    Time 8    Period Weeks    Status New      Additional Long Term Goals   Additional Long Term Goals Yes      PT LONG TERM GOAL #6   Title Pt will ambulate 12 steps with single handrail and step to vs. step through pattern with supervision in order to safely go up/down the stairs in his house.    Baseline 4 steps B handrails step through with CGA    Time 8    Period Weeks    Status New                 Plan - 10/09/20 1203    Clinical Impression Statement Ambulated outdoors with [facility] with quad tip, pt occassionally needing min guard for balance and cues for foot clearance as RLE tends to scuff at times. Discussed that when walking farther distances (such as going on a walk in the neighborhood) to use RW for incr safety with gait. Able to progress resistance and time on SciFit today. Intermittent seated rest breaks throughout session.  Will continue to progress towards LTGs.    Personal Factors and Comorbidities Comorbidity 3+;Past/Current Experience    Comorbidities L MCA CVA, Coronary Artery Disease, Heart Failure, Hyperlipidemia, Osteoarthritis, GERD, Renal Cell Cancer requiring nephrectomy (active)    Examination-Activity Limitations Locomotion Level;Stand;Transfers;Stairs    Examination-Participation Restrictions Community Activity;Occupation   work (is a Customer service manager)   [employer] Stable/Uncomplicated    Rehab Potential Good    PT Frequency 2x / week    PT Duration 8 weeks    PT Treatment/Interventions ADLs/Self Care Home Management;Stair training;Gait training;DME Instruction;Functional mobility training;Therapeutic  activities;Therapeutic exercise;Balance training;Neuromuscular re-education;Patient/family education;Orthotic Fit/Training;Passive range of motion;Vestibular;Energy conservation    PT Next Visit Plan continue gait training with SPC w/ quad tip - obstacle negotiation. balance on compliant surfaces. balance with head motions, esp nods. RLE strength. Scifit for aerobic warm up    PT Home Exercise Plan added eccentric sitting to HEP, 5 reps per set    Consulted and Agree with Plan of Care Patient;Family member/caregiver    Family Member Consulted wife, [name]           Patient will benefit from skilled therapeutic intervention in order to improve the following deficits and impairments:  Abnormal gait,Decreased activity tolerance,Decreased balance,Decreased endurance,Decreased knowledge of use of DME,Decreased range of motion,Decreased strength,Difficulty walking,Decreased safety awareness  Visit Diagnosis: Muscle weakness (generalized)  Unsteadiness on feet  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 Nonischemic cardiomyopathy ([place]) 10/06/2020  . Cryptogenic stroke ([place]) 10/06/2020  . Acute on chronic combined systolic and diastolic CHF (congestive heart failure) ([place]) 07/17/2020  . Renal cell carcinoma ([place]) 07/17/2020  . Protein-calorie malnutrition, severe 07/08/2020  . Acute respiratory failure with hypoxia ([place]) 07/05/2020  . Iron deficiency anemia due to chronic blood loss 06/19/2020  . OSA (obstructive sleep apnea) 01/09/2018  . Pulmonary nodule, left 03/15/2017  . Former smoker 11/04/2014  . Insomnia 05/27/2014  . Plantar wart of left foot 11/27/2010  . Type 2 diabetes mellitus with other specified complication ([place]) 86/57/8469  . PSA, INCREASED 09/15/2009  . Diverticulitis of colon 05/08/2009  .  Asthma, moderate persistent 06/19/2008  . ANKLE PAIN, CHRONIC 06/05/2007  . Allergic rhinitis 05/17/2007  . ERECTILE DYSFUNCTION, SECONDARY TO MEDICATION 05/17/2007  . Hyperlipidemia 04/06/2007  . Anxiety state 04/06/2007  . Essential hypertension 04/06/2007  . Coronary artery disease involving native coronary artery of native heart without angina pectoris 04/06/2007    Arliss Journey, PT, DPT  10/09/2020, 12:04 PM  [facility] [address] [place], [place], [address] Phone: [phone]   Fax:  [phone]  Name: Larry Garner MRN: [id number] Date of Birth: 03/04/1956

## 2020-10-10 ENCOUNTER — Encounter: Payer: Self-pay | Admitting: Speech Pathology

## 2020-10-10 ENCOUNTER — Other Ambulatory Visit: Payer: Self-pay

## 2020-10-10 ENCOUNTER — Ambulatory Visit: Payer: BC Managed Care – PPO | Admitting: Speech Pathology

## 2020-10-10 ENCOUNTER — Encounter: Payer: Self-pay | Admitting: Occupational Therapy

## 2020-10-10 ENCOUNTER — Ambulatory Visit: Payer: BC Managed Care – PPO | Admitting: Occupational Therapy

## 2020-10-10 DIAGNOSIS — R2681 Unsteadiness on feet: Secondary | ICD-10-CM

## 2020-10-10 DIAGNOSIS — R4701 Aphasia: Secondary | ICD-10-CM

## 2020-10-10 DIAGNOSIS — R41840 Attention and concentration deficit: Secondary | ICD-10-CM

## 2020-10-10 DIAGNOSIS — M6281 Muscle weakness (generalized): Secondary | ICD-10-CM | POA: Diagnosis not present

## 2020-10-10 DIAGNOSIS — R41841 Cognitive communication deficit: Secondary | ICD-10-CM

## 2020-10-10 DIAGNOSIS — I69854 Hemiplegia and hemiparesis following other cerebrovascular disease affecting left non-dominant side: Secondary | ICD-10-CM

## 2020-10-10 DIAGNOSIS — R278 Other lack of coordination: Secondary | ICD-10-CM

## 2020-10-10 NOTE — Therapy (Signed)
[facility] [address] [place], [place], [address] Phone: [phone]   Fax:  [phone]  Speech Language Pathology Treatment  Patient Details  Name: Larry Garner MRN: [id number] Date of Birth: Mar 20, 1956 Referring Provider (SLP): Larry Garner, Portal   Encounter Date: 10/10/2020   End of Session - 10/10/20 1143    Visit Number 4    Number of Visits 17    Date for SLP Re-Evaluation 12/05/20    SLP Start Time 1103    SLP Stop Time  1145    SLP Time Calculation (min) 42 min    Activity Tolerance Patient tolerated treatment well           Past Medical History:  Diagnosis Date  . Allergic rhinitis   . Allergy   . Anxiety   . Asthma   . CHF (congestive heart failure) ([place])   . Diabetes ([place])   . Diverticulitis   . Dyspnea   . GERD (gastroesophageal reflux disease)    very occ  . History of heart attack   . HLD (hyperlipidemia)   . HTN (hypertension)   . Myocardial infarction ([place]) 2000  . Sleep apnea    wears cpap   . Tubular adenoma of colon 09/2008    Past Surgical History:  Procedure Laterality Date  . CARDIAC CATHETERIZATION N/A 07/20/2016   Procedure: Left Heart Cath and Cors/Grafts Angiography;  Surgeon: Larry Crome, MD;  Location: [facility];  Service: Cardiovascular;  Laterality: N/A;  . CATARACT EXTRACTION Bilateral 06/06/2019   Dr. Tommy Garner. oct left, dec right 2020   . COLONOSCOPY    . CORONARY ANGIOPLASTY    . CORONARY ARTERY BYPASS GRAFT  06/1999  . POLYPECTOMY    . RENAL BIOPSY  06/2020  . TONSILLECTOMY     late50's early 60's    There were no vitals filed for this visit.   Subjective Assessment - 10/10/20 1106    Subjective "We have been cleared for surgery"    Patient is accompained by: Family member   wife, Larry Garner   Currently in Pain? No/denies                 ADULT SLP TREATMENT - 10/10/20 1107      General Information   Behavior/Cognition  Alert;Cooperative;Pleasant mood      Treatment Provided   Treatment provided Cognitive-Linquistic      Cognitive-Linquistic Treatment   Treatment focused on Aphasia;Patient/family/caregiver education    Skilled Treatment Larry Garner reports improved word finding and they have practiced with [employer].They are successfully using calendar to help Ava manage his schedule. He endorses this has helped him be more independent. High level generative naming, pt required frequent mod verbal (semantic) cues to name 4-5 items, consistent max A to name 8. Provided  compelx categories to [facility].      Assessment / Recommendations / Plan   Plan Continue with current plan of care      Progression Toward Goals   Progression toward goals Progressing toward goals              SLP Short Term Goals - 10/10/20 1142      SLP SHORT TERM GOAL #1   Title pt will name 8 items in abstract categories in 5/6 opportunities in 3 sessions    Time 3    Period Weeks    Status On-going      SLP SHORT TERM GOAL #2   Title  pt will describe to SLP 3 anomia compensations    Time 3    Period Weeks    Status On-going      SLP SHORT TERM GOAL #3   Title pt will verbally sequence pertinent routines for pt with modified independence in 3 sessions    Time 3    Period Weeks    Status On-going      SLP SHORT TERM GOAL #4   Title pt will participate functionally in 10 minutes mod complex conversation using compensations for anomia successfully    Time 3    Period Weeks    Status On-going      SLP SHORT TERM GOAL #5   Title pt will participate in further testing as necessary (WAB-R or if pt desires, CLQT)    Time 1    Period Weeks   or 5 total sessions   Status On-going            SLP Long Term Goals - 10/10/20 1142      SLP LONG TERM GOAL #1   Title pt will participate functionally in 20 minutes mod complex/complex conversation using compensations for anomia successfully in 3 sessions    Time 7     Period Weeks   or 17 total sessions, for all LTGs   Status On-going      SLP LONG TERM GOAL #2   Title pt will express numbers with 100% success with self correction in 3 sessions    Time 7    Period Weeks    Status On-going      SLP LONG TERM GOAL #3   Title pt will listen to a 20 minute discourse (lecture, TED talk, etc) and functionally give a verbal synopsis with compensations (notes, anomia compensations, etc) in 3 sessions    Time 7    Period Weeks    Status On-going            Plan - 10/10/20 [place] presents today with reduced word finding ability as reported and observed today in evaluation, his [employer] score is only slightly above the mean. 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 Pt also with reported attentional deficits described as inability to pay attention to long discourse now compared to pre-morbidly. Additionally pt states he may struggle with managin his own medication at this time, indicating reduced organization/critical thinking skills. Today SLP introduced semantic feature analysis.  SLP believes pt would benefit from skilled ST to target expressive aphasia and if pt desires, cognitive communication/attention skills. He and wife deny swallowing difficulties during meals.    Speech Therapy Frequency 2x / week    Duration 8 weeks   17   Treatment/Interventions Language facilitation;Cueing hierarchy;SLP instruction and feedback;Compensatory strategies;Patient/family education;Internal/external aids;Cognitive reorganization;Multimodal communcation approach;Functional tasks    Potential to Achieve Goals Good           Patient will benefit from skilled therapeutic intervention in order to improve the following deficits and impairments:   Aphasia  Cognitive communication deficit    Problem List Patient  Active Problem List   Diagnosis Date Noted  . Nonischemic cardiomyopathy ([place]) 10/06/2020  . Cryptogenic stroke ([place]) 10/06/2020  . Acute on chronic combined systolic and diastolic CHF (congestive heart failure) ([place]) 07/17/2020  . Renal cell carcinoma ([place]) 07/17/2020  . Protein-calorie malnutrition,  severe 07/08/2020  . Acute respiratory failure with hypoxia ([place]) 07/05/2020  . Iron deficiency anemia due to chronic blood loss 06/19/2020  . OSA (obstructive sleep apnea) 01/09/2018  . Pulmonary nodule, left 03/15/2017  . Former smoker 11/04/2014  . Insomnia 05/27/2014  . Plantar wart of left foot 11/27/2010  . Type 2 diabetes mellitus with other specified complication ([place]) 35/82/5189  . PSA, INCREASED 09/15/2009  . Diverticulitis of colon 05/08/2009  . Asthma, moderate persistent 06/19/2008  . ANKLE PAIN, CHRONIC 06/05/2007  . Allergic rhinitis 05/17/2007  . ERECTILE DYSFUNCTION, SECONDARY TO MEDICATION 05/17/2007  . Hyperlipidemia 04/06/2007  . Anxiety state 04/06/2007  . Essential hypertension 04/06/2007  . Coronary artery disease involving native coronary artery of native heart without angina pectoris 04/06/2007    Ajah Vanhoose, Annye Rusk MS, CCC-SLP 10/10/2020, 11:44 AM  [facility] [address] [place], [place], [address] Phone: [phone]   Fax:  [phone]   Name: ALA CAPRI MRN: [id number] Date of Birth: [birth year]

## 2020-10-10 NOTE — Therapy (Signed)
[facility] [address] [place], [place], [address] Phone: [phone]   Fax:  [phone]  Occupational Therapy Treatment  Patient Details  Name: Larry Garner MRN: [id number] Date of Birth: [birth year] Referring Provider (OT): Bea Laura Redmond   Encounter Date: 10/10/2020   OT End of Session - 10/10/20 1018    Visit Number 13    Number of Visits 20    Date for OT Re-Evaluation 11/06/20    Authorization Type BCBS and Medicare    Authorization Time Period week 2 of 5 from renewal    Authorization - Visit Number 13    Authorization - Number of Visits 20    Progress Note Due on Visit 20    OT Start Time 1017    OT Stop Time 1056    OT Time Calculation (min) 39 min    Activity Tolerance Patient tolerated treatment well    Behavior During Therapy [facility] for tasks assessed/performed           Past Medical History:  Diagnosis Date  . Allergic rhinitis   . Allergy   . Anxiety   . Asthma   . CHF (congestive heart failure) ([place])   . Diabetes ([place])   . Diverticulitis   . Dyspnea   . GERD (gastroesophageal reflux disease)    very occ  . History of heart attack   . HLD (hyperlipidemia)   . HTN (hypertension)   . Myocardial infarction ([place]) 2000  . Sleep apnea    wears cpap   . Tubular adenoma of colon 09/2008    Past Surgical History:  Procedure Laterality Date  . CARDIAC CATHETERIZATION N/A 07/20/2016   Procedure: Left Heart Cath and Cors/Grafts Angiography;  Surgeon: Belva Crome, MD;  Location: [facility];  Service: Cardiovascular;  Laterality: N/A;  . CATARACT EXTRACTION Bilateral 06/06/2019   Dr. Tommy Rainwater. oct left, dec right 2020   . COLONOSCOPY    . CORONARY ANGIOPLASTY    . CORONARY ARTERY BYPASS GRAFT  06/1999  . POLYPECTOMY    . RENAL BIOPSY  06/2020  . TONSILLECTOMY     late50's early 60's    There were no vitals filed for this visit.   Subjective Assessment - 10/10/20 1018     Subjective  Pt denies any pain and is excited cause he is cleared for surgery.    Patient is accompanied by: Family member    Limitations active renal cell cancer. fall risk. no driving    Patient Stated Goals I have trouble with dates and months and ordinal things    Currently in Pain? No/denies                        OT Treatments/Exercises (OP) - 10/10/20 1022      Cognitive Exercises   Problem Solving funcitonal problem solving with organizing your day - complex #2. Pt with min cues for buying groceries later in day or planning to drop off at home d/t temp of the day and items purchasing.    Sequencing Skills Constant Therapy putting steps of common tasks in order with increased difficulty. Pt with difficulty attending to task while spouse and OT were having a conversation and asked for less distraction. 90% accuracy with 64.04s response time.    Attention Span Alternating Constant Therapy alteranting symbol level 1 with 92% accuracy and 38.21s response time - much improved from last time completing this tast.  OT Short Term Goals - 09/29/20 1104      OT SHORT TERM GOAL #1   Title Pt will be independent with HEP 09/05/20    Time 3    Period Weeks    Status Achieved    Target Date 09/05/20      OT SHORT TERM GOAL #2   Title Pt will increase grip strength in LUE to 42 lbs or more for increase in functional use of LUE.    Baseline RUE 52.6 LUE 38.8    Time 3    Period Weeks    Status On-going   32.6 lbs LUE 09/29/20     OT SHORT TERM GOAL #3   Title Pt will perform simple warm meal prep with supervision for return to prior level of functioning    Time 3    Period Weeks    Status Achieved      OT SHORT TERM GOAL #4   Title Pt will increase standing tolerance to 10 minutes or greater for increasing overall endurance and activity tolerance    Time 3    Period Weeks    Status Achieved             OT Long Term Goals - 10/09/20 1352       OT LONG TERM GOAL #1   Title *Pt will be independent with updated HEP for UE proximal strengthening and grip strengthening 12/08/2020    Time 10    Period Weeks    Status On-going      OT LONG TERM GOAL #2   Title *Pt will increase grip strength in LUE to 48 lbs or greater for increase in overall functional use of LUE    Baseline RUE 52.6 LUE 38.8    Time 6    Period Weeks    Status On-going   32.6lbs LUE     OT LONG TERM GOAL #3   Title Pt will complete 9 hole peg test in 32 seconds or less with LUE for increase in fine motor coordination.    Baseline RUE 28.69, LUE 37.03    Time 6    Period Weeks    Status Achieved   24.34     OT LONG TERM GOAL #4   Title Pt will complete mod complex meal prep with mod I for increase in return to prior level of functioning and home management and meal prep tasks    Time 6    Period Weeks    Status Achieved   completed at home with increased time.     OT LONG TERM GOAL #5   Title Pt will tolerate 15 minutes of continuous standing activities for increase in overall standing tolerance and activity tolerance for completing IADLs.    Time 6    Period Weeks    Status Achieved   pt is completing standing for > 15 minutes with cooking at home etc     OT LONG TERM GOAL #6   Title *Pt will complete physical and cognitive task simultaneously with 90% accuracy.    Time 6    Period Weeks    Status On-going   75-80% with diff continuing walking with thinking     OT LONG TERM GOAL #7   Title Complete FOTO at discharge    Time 6    Period Weeks    Status On-going                 Plan - 10/10/20 1034  Clinical Impression Statement Pt continues to progress towards goals. Pt with continued difficulty with alternating attention and funtional cognition.    OT Occupational Profile and History Problem Focused Assessment - Including review of records relating to presenting problem    Occupational performance deficits (Please refer to  evaluation for details): IADL's;ADL's;Leisure    Body Structure / Function / Physical Skills ADL;Decreased knowledge of use of DME;Strength;GMC;Balance;UE functional use;IADL;ROM;Endurance;FMC;Coordination    Rehab Potential Good    Clinical Decision Making Limited treatment options, no task modification necessary    Comorbidities Affecting Occupational Performance: None    OT Frequency 2x / week    OT Duration Other (comment)   2x/week for 5 weeks after renewal.   OT Treatment/Interventions Self-care/ADL training;DME and/or AE instruction;Therapeutic activities;Therapeutic exercise;Functional Mobility Training;Energy conservation;Patient/family education    Plan continue progressing towards goals, functional cognition, maybe a recipe or following instructions i.e. rice?    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 Nonischemic cardiomyopathy ([place]) 10/06/2020  . Cryptogenic stroke ([place]) 10/06/2020  . Acute on chronic combined systolic and diastolic CHF (congestive heart failure) ([place]) 07/17/2020  . Renal cell carcinoma ([place]) 07/17/2020  . Protein-calorie malnutrition, severe 07/08/2020  . Acute respiratory failure with hypoxia ([place]) 07/05/2020   . Iron deficiency anemia due to chronic blood loss 06/19/2020  . OSA (obstructive sleep apnea) 01/09/2018  . Pulmonary nodule, left 03/15/2017  . Former smoker 11/04/2014  . Insomnia 05/27/2014  . Plantar wart of left foot 11/27/2010  . Type 2 diabetes mellitus with other specified complication ([place]) 59/29/2446  . PSA, INCREASED 09/15/2009  . Diverticulitis of colon 05/08/2009  . Asthma, moderate persistent 06/19/2008  . ANKLE PAIN, CHRONIC 06/05/2007  . Allergic rhinitis 05/17/2007  . ERECTILE DYSFUNCTION, SECONDARY TO MEDICATION 05/17/2007  . Hyperlipidemia 04/06/2007  . Anxiety state 04/06/2007  . Essential hypertension 04/06/2007  . Coronary artery disease involving native coronary artery of native heart without angina pectoris 04/06/2007    Zachery Conch MOT, OTR/L  10/10/2020, [facility] [address] [place], [place], [address] Phone: [phone]   Fax:  [phone]  Name: Larry Garner MRN: [id number] Date of Birth: September 17, 1955

## 2020-10-13 ENCOUNTER — Other Ambulatory Visit: Payer: Self-pay

## 2020-10-13 ENCOUNTER — Other Ambulatory Visit (INDEPENDENT_AMBULATORY_CARE_PROVIDER_SITE_OTHER): Payer: BC Managed Care – PPO

## 2020-10-13 ENCOUNTER — Ambulatory Visit: Payer: BC Managed Care – PPO

## 2020-10-13 DIAGNOSIS — M6281 Muscle weakness (generalized): Secondary | ICD-10-CM | POA: Diagnosis not present

## 2020-10-13 DIAGNOSIS — E1169 Type 2 diabetes mellitus with other specified complication: Secondary | ICD-10-CM | POA: Diagnosis not present

## 2020-10-13 DIAGNOSIS — R4701 Aphasia: Secondary | ICD-10-CM

## 2020-10-13 DIAGNOSIS — R41841 Cognitive communication deficit: Secondary | ICD-10-CM

## 2020-10-13 LAB — HEMOGLOBIN A1C: Hgb A1c MFr Bld: 7.1 % — ABNORMAL HIGH (ref 4.6–6.5)

## 2020-10-13 NOTE — Therapy (Signed)
[facility] [address] [place], [place], [address] Phone: [phone]   Fax:  [phone]  Speech Language Pathology Treatment  Patient Details  Name: Larry Garner MRN: [id number] Date of Birth: [birth year] Referring Provider (SLP): Teresa Coombs, [place]   Encounter Date: 10/13/2020   End of Session - 10/13/20 1449    Visit Number 5    Number of Visits 17    Date for SLP Re-Evaluation 12/05/20    SLP Start Time 1104    SLP Stop Time  1146    SLP Time Calculation (min) 42 min    Activity Tolerance Patient tolerated treatment well           Past Medical History:  Diagnosis Date  . Allergic rhinitis   . Allergy   . Anxiety   . Asthma   . CHF (congestive heart failure) ([place])   . Diabetes ([place])   . Diverticulitis   . Dyspnea   . GERD (gastroesophageal reflux disease)    very occ  . History of heart attack   . HLD (hyperlipidemia)   . HTN (hypertension)   . Myocardial infarction ([place]) 2000  . Sleep apnea    wears cpap   . Tubular adenoma of colon 09/2008    Past Surgical History:  Procedure Laterality Date  . CARDIAC CATHETERIZATION N/A 07/20/2016   Procedure: Left Heart Cath and Cors/Grafts Angiography;  Surgeon: Belva Crome, MD;  Location: [facility];  Service: Cardiovascular;  Laterality: N/A;  . CATARACT EXTRACTION Bilateral 06/06/2019   Dr. Tommy Rainwater. oct left, dec right 2020   . COLONOSCOPY    . CORONARY ANGIOPLASTY    . CORONARY ARTERY BYPASS GRAFT  06/1999  . POLYPECTOMY    . RENAL BIOPSY  06/2020  . TONSILLECTOMY     late50's early 60's    There were no vitals filed for this visit.   Subjective Assessment - 10/13/20 1112    Subjective "I haven't needed to use it for any appointments." (pt, re: semantic feature analysis as practice for anomia)    Patient is accompained by: --   beth - wife   Currently in Pain? No/denies                 ADULT SLP TREATMENT - 10/13/20  1113      General Information   Behavior/Cognition Alert;Cooperative;Pleasant mood      Treatment Provided   Treatment provided Cognitive-Linquistic      Cognitive-Linquistic Treatment   Treatment focused on Aphasia;Patient/family/caregiver education    Skilled Treatment Pt did not recall doing divergent naming tasks last night. Pt and wife agree that anomia ~x3 per day. "[name] is really good at providing hints," pt stated. Pt has kept his calendar since last week - "It really makes my day go better. I didn't think it would make a difference (keeping more organized about the scheduling) but it has." In this mod complex conversation of 10 mintes - no anomia noted. To target anomia SLP had pt describe foods and beverages for SLP and wife - one anomic event in 73 descriptions!      Assessment / Recommendations / Plan   Plan Continue with current plan of care      Progression Toward Goals   Progression toward goals Progressing toward goals              SLP Short Term Goals - 10/13/20 1452      SLP SHORT TERM  GOAL #1   Title pt will name 8 items in abstract categories in 5/6 opportunities in 3 sessions    Time 2    Period Weeks    Status On-going      SLP SHORT TERM GOAL #2   Title pt will describe to SLP 3 anomia compensations    Time 2    Period Weeks    Status On-going      SLP SHORT TERM GOAL #3   Title pt will verbally sequence pertinent routines for pt with modified independence in 3 sessions    Time 2    Period Weeks    Status On-going      SLP SHORT TERM GOAL #4   Title pt will participate functionally in 10 minutes mod complex conversation using compensations for anomia successfully    Time 2    Period Weeks    Status On-going      SLP SHORT TERM GOAL #5   Title pt will participate in further testing as necessary (WAB-R or if pt desires, CLQT)    Period --   or 5 total sessions   Status Deferred            SLP Long Term Goals - 10/13/20 1453      SLP  LONG TERM GOAL #1   Title pt will participate functionally in 20 minutes mod complex/complex conversation using compensations for anomia successfully in 3 sessions    Time 6    Period Weeks   or 17 total sessions, for all LTGs   Status On-going      SLP LONG TERM GOAL #2   Title pt will express numbers with 100% success with self correction in 3 sessions    Time 6    Period Weeks    Status On-going      SLP LONG TERM GOAL #3   Title pt will listen to a 20 minute discourse (lecture, TED talk, etc) and functionally give a verbal synopsis with compensations (notes, anomia compensations, etc) in 3 sessions    Time 6    Period Weeks    Status On-going            Plan - 10/13/20 1450    Clinical Impression Statement Aydden presents today with reported reduced word finding ability in more stressful situations such as MD appointments; today only one anomic episode in mod complex picture description. Boston Naming Test-2 score is only slightly above the mean. Pt also with reported attentional deficits described as inability to pay attention to long discourse now compared to pre-morbidly. Additionally pt states he may struggle with managin his own medication at this time, indicating reduced organization/critical thinking skills. SLP believes pt would benefit from skilled ST to target expressive aphasia and if pt desires, cognitive communication/attention skills. He and wife deny swallowing difficulties during meals.    Speech Therapy Frequency 2x / week    Duration 8 weeks   17   Treatment/Interventions Language facilitation;Cueing hierarchy;SLP instruction and feedback;Compensatory strategies;Patient/family education;Internal/external aids;Cognitive reorganization;Multimodal communcation approach;Functional tasks    Potential to Achieve Goals Good           Patient will benefit from skilled therapeutic intervention in order to improve the following deficits and impairments:    Aphasia  Cognitive communication deficit    Problem List Patient Active Problem List   Diagnosis Date Noted  . Nonischemic cardiomyopathy ([place]) 10/06/2020  . Cryptogenic stroke ([place]) 10/06/2020  . Acute on chronic combined systolic and diastolic  CHF (congestive heart failure) ([place]) 07/17/2020  . Renal cell carcinoma ([place]) 07/17/2020  . Protein-calorie malnutrition, severe 07/08/2020  . Acute respiratory failure with hypoxia ([place]) 07/05/2020  . Iron deficiency anemia due to chronic blood loss 06/19/2020  . OSA (obstructive sleep apnea) 01/09/2018  . Pulmonary nodule, left 03/15/2017  . Former smoker 11/04/2014  . Insomnia 05/27/2014  . Plantar wart of left foot 11/27/2010  . Type 2 diabetes mellitus with other specified complication ([place]) 54/88/3014  . PSA, INCREASED 09/15/2009  . Diverticulitis of colon 05/08/2009  . Asthma, moderate persistent 06/19/2008  . ANKLE PAIN, CHRONIC 06/05/2007  . Allergic rhinitis 05/17/2007  . ERECTILE DYSFUNCTION, SECONDARY TO MEDICATION 05/17/2007  . Hyperlipidemia 04/06/2007  . Anxiety state 04/06/2007  . Essential hypertension 04/06/2007  . Coronary artery disease involving native coronary artery of native heart without angina pectoris 04/06/2007    [facility] ,[place], CCC-SLP  10/13/2020, 2:53 PM  [facility] [address] [place], [place], [address] Phone: [phone]   Fax:  [phone]   Name: Larry Garner MRN: [id number] Date of Birth: 11-29-1955

## 2020-10-14 ENCOUNTER — Ambulatory Visit: Payer: BC Managed Care – PPO | Admitting: Physical Therapy

## 2020-10-14 ENCOUNTER — Encounter: Payer: Self-pay | Admitting: Physical Therapy

## 2020-10-14 DIAGNOSIS — R2681 Unsteadiness on feet: Secondary | ICD-10-CM

## 2020-10-14 DIAGNOSIS — R2689 Other abnormalities of gait and mobility: Secondary | ICD-10-CM

## 2020-10-14 DIAGNOSIS — M6281 Muscle weakness (generalized): Secondary | ICD-10-CM | POA: Diagnosis not present

## 2020-10-14 DIAGNOSIS — I69854 Hemiplegia and hemiparesis following other cerebrovascular disease affecting left non-dominant side: Secondary | ICD-10-CM

## 2020-10-14 NOTE — Therapy (Signed)
[facility] [address] [place], [place], [address] Phone: [phone]   Fax:  [phone]  Physical Therapy Treatment  Patient Details  Name: Larry Garner MRN: [id number] Date of Birth: April 10, 1956 Referring Provider (PT): Redmond, Bea Laura, NP   Encounter Date: 10/14/2020   PT End of Session - 10/14/20 1425    Visit Number 14    Number of Visits 17    Date for PT Re-Evaluation 11/25/20    Authorization Type BCBS, Medicare    PT Start Time 1101    PT Stop Time 1144    PT Time Calculation (min) 43 min    Equipment Utilized During Treatment Gait belt    Activity Tolerance Patient tolerated treatment well    Behavior During Therapy [facility] for tasks assessed/performed           Past Medical History:  Diagnosis Date  . Allergic rhinitis   . Allergy   . Anxiety   . Asthma   . CHF (congestive heart failure) ([place])   . Diabetes ([place])   . Diverticulitis   . Dyspnea   . GERD (gastroesophageal reflux disease)    very occ  . History of heart attack   . HLD (hyperlipidemia)   . HTN (hypertension)   . Myocardial infarction ([place]) 2000  . Sleep apnea    wears cpap   . Tubular adenoma of colon 09/2008    Past Surgical History:  Procedure Laterality Date  . CARDIAC CATHETERIZATION N/A 07/20/2016   Procedure: Left Heart Cath and Cors/Grafts Angiography;  Surgeon: Belva Crome, MD;  Location: [facility];  Service: Cardiovascular;  Laterality: N/A;  . CATARACT EXTRACTION Bilateral 06/06/2019   Dr. Tommy Rainwater. oct left, dec right 2020   . COLONOSCOPY    . CORONARY ANGIOPLASTY    . CORONARY ARTERY BYPASS GRAFT  06/1999  . POLYPECTOMY    . RENAL BIOPSY  06/2020  . TONSILLECTOMY     late50's early 60's    There were no vitals filed for this visit.   Subjective Assessment - 10/14/20 1104    Subjective Has to get a CT scan and has an appointment to meet with the surgeon at the beginning of april.    Patient is  accompained by: Family member   Larry Garner   Pertinent History L MCA CVA, Coronary Artery Disease, Heart Failure, Hyperlipidemia, Osteoarthritis, GERD, Renal Cell Cancer requiring nephrectomy (active)    Limitations Standing;Walking;Other (comment)   stairs   How long can you walk comfortably? approx. 100' with RW    Patient Stated Goals go up and down the stairs by himself, find out what his new normal will be, walk without the RW    Currently in Pain? No/denies                             [facility] Adult PT Treatment/Exercise - 10/14/20 1113      Ambulation/Gait   Ambulation/Gait Yes    Ambulation/Gait Assistance 4: Min guard;5: Supervision    Ambulation/Gait Assistance Details with SPC with quad tip with performing head nods/turns (pt with incr difficulty with nods), ambulating over blue compliant surface, and speeding up gait speed (pt only minimally able to incr speed)    Ambulation Distance (Feet) 345 Feet    Assistive device Straight cane    Gait Pattern Step-through pattern;Decreased stance time - right    Ambulation Surface Level;Indoor  Knee/Hip Exercises: Standing   Hip Flexion Stengthening;2 sets;10 reps    Hip Flexion Limitations 2# ankle weight, cues for posture, with BUE support in // bars    Hip Abduction Stengthening;2 sets;10 reps    Abduction Limitations 2# ankle weight    Lateral Step Up Right;10 reps;Hand Hold: 2;Left;1 set;Step Height: 6";Step Height: 4"    Lateral Step Up Limitations cues for technique, pt only able to perform 3 reps with RLE (could not perform and needing mod A for balance as R leg could not lift him back onto step), downgraded to 4" step for 10 reps    Forward Step Up Right;1 set;10 reps;Hand Hold: 2;Step Height: 6";Left    Forward Step Up Limitations 6" step - cues to "float" non stance leg    Other Standing Knee Exercises with 2# ankle weight: heel  raises x10 reps               Balance Exercises - 10/14/20 0001       Balance Exercises: Standing   Stepping Strategy Posterior;Foam/compliant surface;UE support;10 reps    Stepping Strategy Limitations on blue air ex, alternating stepping with verbal and demo cues for proper sequencing    Sidestepping Foam/compliant support;3 reps;Limitations    Sidestepping Limitations on foam beam: with fingertip support, downand back 3 reps with cues for foot clearance               PT Short Term Goals - 09/24/20 1157      PT SHORT TERM GOAL #1   Title Pt will be independent with initial HEP and verbalize understanding of decr fall risk in the home. ALL STGS DUE 09/24/20    Baseline Independent    Time 4    Period Weeks    Status Achieved    Target Date 09/24/20      PT SHORT TERM GOAL #2   Title Pt will improve BERG to at least a 49/56 in order to demo decr fall risk.    Baseline BERG 44/56, 50/56 on 09/22/20    Time 4    Period Weeks    Status Achieved      PT SHORT TERM GOAL #3   Title Pt will perform 5x sit <> stand in 20 seconds or less with single UE support in order to demo incr balance.    Baseline 25 seconds, 24.78 with BUE support from arm chair, 4/10 RPE afterwards on 09/22/20    Time 4    Period Weeks    Status Not Met      PT SHORT TERM GOAL #4   Title Pt will improve gait speed with RW vs. LRAD to at least 2.4 ft/sec in order to demo improved community mobility.    Baseline 2.00 ft/sec; 1.60 ft/sec with LRAD (SPC)    Time 4    Period Weeks    Status Not Met      PT SHORT TERM GOAL #5   Title Pt will perform TUG with no AD in 17 seconds or less in order to demo decr fall risk.    Baseline 20.63 seconds, 17.5 seconds with SPC with quad tip on 09/22/20    Time 4    Period Weeks    Status Not Met             PT Long Term Goals - 08/27/20 1516      PT LONG TERM GOAL #1   Title Pt will be independent with final HEP in order  to build upon functional gains made in therapy. ALL LTGS DUE 10/22/20    Time 8    Period Weeks    Status New     Target Date 10/22/20      PT LONG TERM GOAL #2   Title BERG/DGI goal to be written as appropriate.    Time 8    Period Weeks    Status New      PT LONG TERM GOAL #3   Title Pt will improve gait speed with LRAD vs. no AD to at least 2.7 ft/sec in order to demo improved community mobility.    Baseline 2.00 ft/sec    Time 8    Period Weeks    Status New      PT LONG TERM GOAL #4   Title Pt will perform TUG with no AD in 13.5 seconds or less in order to demo decr fall risk.    Baseline 20.63 seconds    Time 8    Period Weeks    Status New      PT LONG TERM GOAL #5   Title Pt will ambulate at least 500' with supervision over indoor/outdoor surfaces with LRAD in order to demo improved community mobility.    Time 8    Period Weeks    Status New      Additional Long Term Goals   Additional Long Term Goals Yes      PT LONG TERM GOAL #6   Title Pt will ambulate 12 steps with single handrail and step to vs. step through pattern with supervision in order to safely go up/down the stairs in his house.    Baseline 4 steps B handrails step through with CGA    Time 8    Period Weeks    Status New                 Plan - 10/14/20 1431    Clinical Impression Statement Pt needing min guard at times for gait when scanning environment with use of cane - especially when looking up. Pt unable to perform lateral step ups to 6" step with BLE support with RLE, only able to perform 3 reps before RLE could not lift him back up onto the step. Will continue to progress towards LTGs.    Personal Factors and Comorbidities Comorbidity 3+;Past/Current Experience    Comorbidities L MCA CVA, Coronary Artery Disease, Heart Failure, Hyperlipidemia, Osteoarthritis, GERD, Renal Cell Cancer requiring nephrectomy (active)    Examination-Activity Limitations Locomotion Level;Stand;Transfers;Stairs    Examination-Participation Restrictions Community Activity;Occupation   work (is a Customer service manager)    [employer] Stable/Uncomplicated    Rehab Potential Good    PT Frequency 2x / week    PT Duration 8 weeks    PT Treatment/Interventions ADLs/Self Care Home Management;Stair training;Gait training;DME Instruction;Functional mobility training;Therapeutic activities;Therapeutic exercise;Balance training;Neuromuscular re-education;Patient/family education;Orthotic Fit/Training;Passive range of motion;Vestibular;Energy conservation    PT Next Visit Plan add exercises for home with tband for strengthening. continue gait training with [place] w/ quad tip - obstacle negotiation. balance on compliant surfaces. balance with head motions, esp nods. RLE strength. Scifit for aerobic warm up    PT Home Exercise Plan added eccentric sitting to HEP, 5 reps per set    Consulted and Agree with Plan of Care Patient;Family member/caregiver    Family Member Consulted wife, Larry Garner           Patient will benefit from skilled therapeutic intervention in order to improve the following deficits  and impairments:  Abnormal gait,Decreased activity tolerance,Decreased balance,Decreased endurance,Decreased knowledge of use of DME,Decreased range of motion,Decreased strength,Difficulty walking,Decreased safety awareness  Visit Diagnosis: Muscle weakness (generalized)  Unsteadiness on feet  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 Nonischemic cardiomyopathy ([place]) 10/06/2020  . Cryptogenic stroke ([place]) 10/06/2020  . Acute on chronic combined systolic and diastolic CHF (congestive heart failure) ([place]) 07/17/2020  . Renal cell carcinoma ([place]) 07/17/2020  . Protein-calorie malnutrition, severe 07/08/2020  . Acute respiratory failure with hypoxia ([place]) 07/05/2020  . Iron deficiency anemia due to chronic blood loss 06/19/2020   . OSA (obstructive sleep apnea) 01/09/2018  . Pulmonary nodule, left 03/15/2017  . Former smoker 11/04/2014  . Insomnia 05/27/2014  . Plantar wart of left foot 11/27/2010  . Type 2 diabetes mellitus with other specified complication ([place]) 34/01/8402  . PSA, INCREASED 09/15/2009  . Diverticulitis of colon 05/08/2009  . Asthma, moderate persistent 06/19/2008  . ANKLE PAIN, CHRONIC 06/05/2007  . Allergic rhinitis 05/17/2007  . ERECTILE DYSFUNCTION, SECONDARY TO MEDICATION 05/17/2007  . Hyperlipidemia 04/06/2007  . Anxiety state 04/06/2007  . Essential hypertension 04/06/2007  . Coronary artery disease involving native coronary artery of native heart without angina pectoris 04/06/2007    Arliss Journey, PT, DPT  10/14/2020, 2:33 PM  [facility] [address] [place], [place], [address] Phone: [phone]   Fax:  [phone]  Name: Larry Garner MRN: [id number] Date of Birth: [birth year]

## 2020-10-15 ENCOUNTER — Ambulatory Visit: Payer: BC Managed Care – PPO

## 2020-10-15 ENCOUNTER — Encounter: Payer: Self-pay | Admitting: Physical Therapy

## 2020-10-15 ENCOUNTER — Ambulatory Visit: Payer: BC Managed Care – PPO | Admitting: Physical Therapy

## 2020-10-15 ENCOUNTER — Other Ambulatory Visit: Payer: Self-pay

## 2020-10-15 ENCOUNTER — Encounter: Payer: Self-pay | Admitting: Occupational Therapy

## 2020-10-15 ENCOUNTER — Ambulatory Visit: Payer: BC Managed Care – PPO | Admitting: Occupational Therapy

## 2020-10-15 DIAGNOSIS — M6281 Muscle weakness (generalized): Secondary | ICD-10-CM

## 2020-10-15 DIAGNOSIS — R278 Other lack of coordination: Secondary | ICD-10-CM

## 2020-10-15 DIAGNOSIS — R2689 Other abnormalities of gait and mobility: Secondary | ICD-10-CM

## 2020-10-15 DIAGNOSIS — R4701 Aphasia: Secondary | ICD-10-CM

## 2020-10-15 DIAGNOSIS — R2681 Unsteadiness on feet: Secondary | ICD-10-CM

## 2020-10-15 DIAGNOSIS — R41840 Attention and concentration deficit: Secondary | ICD-10-CM

## 2020-10-15 DIAGNOSIS — R41841 Cognitive communication deficit: Secondary | ICD-10-CM

## 2020-10-15 DIAGNOSIS — I69854 Hemiplegia and hemiparesis following other cerebrovascular disease affecting left non-dominant side: Secondary | ICD-10-CM

## 2020-10-15 DIAGNOSIS — R41844 Frontal lobe and executive function deficit: Secondary | ICD-10-CM

## 2020-10-15 NOTE — Patient Instructions (Signed)
Access Code: [facility] URL: [email or web address] Date: 10/15/2020 Prepared by: Janann August  Exercises Sit to Stand with Armchair - 2 x daily - 5 x weekly - 2 sets - 5 reps Side Stepping with Resistance at Thighs and Counter Support - 2 x daily - 5 x weekly - 3 sets Seated Ankle Dorsiflexion with Resistance - 1 x daily - 5 x weekly - 2 sets - 10 reps Heel rises with counter support - 1 x daily - 5 x weekly - 1-2 sets - 10 reps Standing Marching - 1 x daily - 5 x weekly - 2 sets - 8 reps Standing Knee Flexion AROM with Chair Support - 1 x daily - 5 x weekly - 1-2 sets - 8 reps Romberg Stance on Foam Pad - 1 x daily - 5 x weekly - 2 sets - 10 reps

## 2020-10-15 NOTE — Therapy (Signed)
[facility] [address] [place], [place], [address] Phone: [phone]   Fax:  [phone]  Physical Therapy Treatment  Patient Details  Name: Larry Garner MRN: [id number] Date of Birth: 1956/01/30 Referring Provider (PT): Redmond, Bea Laura, NP   Encounter Date: 10/15/2020   PT End of Session - 10/15/20 1144    Visit Number 15    Number of Visits 17    Date for PT Re-Evaluation 11/25/20    Authorization Type BCBS, Medicare    PT Start Time 1017    PT Stop Time 1101    PT Time Calculation (min) 44 min    Equipment Utilized During Treatment Gait belt    Activity Tolerance Patient tolerated treatment well    Behavior During Therapy [facility] for tasks assessed/performed           Past Medical History:  Diagnosis Date  . Allergic rhinitis   . Allergy   . Anxiety   . Asthma   . CHF (congestive heart failure) ([place])   . Diabetes ([place])   . Diverticulitis   . Dyspnea   . GERD (gastroesophageal reflux disease)    very occ  . History of heart attack   . HLD (hyperlipidemia)   . HTN (hypertension)   . Myocardial infarction ([place]) 2000  . Sleep apnea    wears cpap   . Tubular adenoma of colon 09/2008    Past Surgical History:  Procedure Laterality Date  . CARDIAC CATHETERIZATION N/A 07/20/2016   Procedure: Left Heart Cath and Cors/Grafts Angiography;  Surgeon: Belva Crome, MD;  Location: [facility];  Service: Cardiovascular;  Laterality: N/A;  . CATARACT EXTRACTION Bilateral 06/06/2019   Dr. Tommy Rainwater. oct left, dec right 2020   . COLONOSCOPY    . CORONARY ANGIOPLASTY    . CORONARY ARTERY BYPASS GRAFT  06/1999  . POLYPECTOMY    . RENAL BIOPSY  06/2020  . TONSILLECTOMY     late50's early 60's    There were no vitals filed for this visit.   Subjective Assessment - 10/15/20 1019    Subjective Nothing new since last here.    Patient is accompained by: Family member   beth   Pertinent History L MCA CVA,  Coronary Artery Disease, Heart Failure, Hyperlipidemia, Osteoarthritis, GERD, Renal Cell Cancer requiring nephrectomy (active)    Limitations Standing;Walking;Other (comment)   stairs   How long can you walk comfortably? approx. 100' with RW    Patient Stated Goals go up and down the stairs by himself, find out what his new normal will be, walk without the RW    Currently in Pain? No/denies                              Access Code: [facility] URL: [email or web address] Date: 10/15/2020 Prepared by: Janann August  Reviewed and upgraded pt's HEP. See MedBridge for more details.   Exercises Sit to Stand with Armchair - 2 x daily - 5 x weekly - 2 sets - 5 reps Side Stepping with Resistance at Thighs and Counter Support - 2 x daily - 5 x weekly - 3 sets - with use of red band around thighs, cues for holding a gentle mini squat position  Seated Ankle Dorsiflexion with Resistance - 1 x daily - 5 x weekly - 2 sets - 10 reps Heel rises with counter support - 1 x daily - 5 x weekly -  1-2 sets - 10 reps - and adding in toe raises  Standing Marching - 1 x daily - 5 x weekly - 2 sets - 8 reps - static at countertop, alternating legs with single UE support and red t band  Standing Knee Flexion AROM with Chair Support - 1 x daily - 5 x weekly - 1-2 sets - 8 reps - with 1-2# ankle weights  Romberg Stance on Foam Pad - 1 x daily - 5 x weekly - 2 sets - 10 reps - eyes open with head nods x10 reps, head turns x10 reps         PT Education - 10/15/20 1143    Education Details reviewed HEP and upgraded as appropriate for strengthening.    Person(s) Educated Patient    Methods Explanation;Demonstration;Handout;Verbal cues    Comprehension Verbalized understanding;Returned demonstration            PT Short Term Goals - 09/24/20 1157      PT SHORT TERM GOAL #1   Title Pt will be independent with initial HEP and verbalize understanding of decr fall risk in the home.  ALL STGS DUE 09/24/20    Baseline Independent    Time 4    Period Weeks    Status Achieved    Target Date 09/24/20      PT SHORT TERM GOAL #2   Title Pt will improve BERG to at least a 49/56 in order to demo decr fall risk.    Baseline BERG 44/56, 50/56 on 09/22/20    Time 4    Period Weeks    Status Achieved      PT SHORT TERM GOAL #3   Title Pt will perform 5x sit <> stand in 20 seconds or less with single UE support in order to demo incr balance.    Baseline 25 seconds, 24.78 with BUE support from arm chair, 4/10 RPE afterwards on 09/22/20    Time 4    Period Weeks    Status Not Met      PT SHORT TERM GOAL #4   Title Pt will improve gait speed with RW vs. LRAD to at least 2.4 ft/sec in order to demo improved community mobility.    Baseline 2.00 ft/sec; 1.60 ft/sec with LRAD (SPC)    Time 4    Period Weeks    Status Not Met      PT SHORT TERM GOAL #5   Title Pt will perform TUG with no AD in 17 seconds or less in order to demo decr fall risk.    Baseline 20.63 seconds, 17.5 seconds with SPC with quad tip on 09/22/20    Time 4    Period Weeks    Status Not Met             PT Long Term Goals - 08/27/20 1516      PT LONG TERM GOAL #1   Title Pt will be independent with final HEP in order to build upon functional gains made in therapy. ALL LTGS DUE 10/22/20    Time 8    Period Weeks    Status New    Target Date 10/22/20      PT LONG TERM GOAL #2   Title BERG/DGI goal to be written as appropriate.    Time 8    Period Weeks    Status New      PT LONG TERM GOAL #3   Title Pt will improve gait speed with LRAD  vs. no AD to at least 2.7 ft/sec in order to demo improved community mobility.    Baseline 2.00 ft/sec    Time 8    Period Weeks    Status New      PT LONG TERM GOAL #4   Title Pt will perform TUG with no AD in 13.5 seconds or less in order to demo decr fall risk.    Baseline 20.63 seconds    Time 8    Period Weeks    Status New      PT LONG TERM GOAL #5    Title Pt will ambulate at least 500' with supervision over indoor/outdoor surfaces with LRAD in order to demo improved community mobility.    Time 8    Period Weeks    Status New      Additional Long Term Goals   Additional Long Term Goals Yes      PT LONG TERM GOAL #6   Title Pt will ambulate 12 steps with single handrail and step to vs. step through pattern with supervision in order to safely go up/down the stairs in his house.    Baseline 4 steps B handrails step through with CGA    Time 8    Period Weeks    Status New                 Plan - 10/15/20 1149    Clinical Impression Statement Today's skilled session focused on reviewing pt's HEP and making upgrades/revisions for BLE strengthening and balance. Pt tolerated session well. Will continue to progress towards LTGs.    Personal Factors and Comorbidities Comorbidity 3+;Past/Current Experience    Comorbidities L MCA CVA, Coronary Artery Disease, Heart Failure, Hyperlipidemia, Osteoarthritis, GERD, Renal Cell Cancer requiring nephrectomy (active)    Examination-Activity Limitations Locomotion Level;Stand;Transfers;Stairs    Examination-Participation Restrictions Community Activity;Occupation   work (is a Customer service manager)   [employer] Stable/Uncomplicated    Rehab Potential Good    PT Frequency 2x / week    PT Duration 8 weeks    PT Treatment/Interventions ADLs/Self Care Home Management;Stair training;Gait training;DME Instruction;Functional mobility training;Therapeutic activities;Therapeutic exercise;Balance training;Neuromuscular re-education;Patient/family education;Orthotic Fit/Training;Passive range of motion;Vestibular;Energy conservation    PT Next Visit Plan how were new exercises? check LTGs for re-cert. balance on compliant surfaces. balance with head motions, esp nods. RLE strength. Scifit for aerobic warm up    PT Home Exercise Plan added eccentric sitting to HEP, 5 reps per set    Consulted and  Agree with Plan of Care Patient;Family member/caregiver    Family Member Consulted wife, [name]           Patient will benefit from skilled therapeutic intervention in order to improve the following deficits and impairments:  Abnormal gait,Decreased activity tolerance,Decreased balance,Decreased endurance,Decreased knowledge of use of DME,Decreased range of motion,Decreased strength,Difficulty walking,Decreased safety awareness  Visit Diagnosis: Muscle weakness (generalized)  Unsteadiness on feet  Other abnormalities of gait and mobility  Other lack of coordination     Problem List Patient Active Problem List   Diagnosis Date Noted  . Nonischemic cardiomyopathy ([place]) 10/06/2020  . Cryptogenic stroke ([place]) 10/06/2020  . Acute on chronic combined systolic and diastolic CHF (congestive heart failure) ([place]) 07/17/2020  . Renal cell carcinoma ([place]) 07/17/2020  . Protein-calorie malnutrition, severe 07/08/2020  . Acute respiratory failure with hypoxia ([place]) 07/05/2020  . Iron deficiency anemia due to chronic blood loss 06/19/2020  . OSA (obstructive sleep apnea) 01/09/2018  . Pulmonary nodule, left 03/15/2017  .  Former smoker 11/04/2014  . Insomnia 05/27/2014  . Plantar wart of left foot 11/27/2010  . Type 2 diabetes mellitus with other specified complication ([place]) 10/28/197  . PSA, INCREASED 09/15/2009  . Diverticulitis of colon 05/08/2009  . Asthma, moderate persistent 06/19/2008  . ANKLE PAIN, CHRONIC 06/05/2007  . Allergic rhinitis 05/17/2007  . ERECTILE DYSFUNCTION, SECONDARY TO MEDICATION 05/17/2007  . Hyperlipidemia 04/06/2007  . Anxiety state 04/06/2007  . Essential hypertension 04/06/2007  . Coronary artery disease involving native coronary artery of native heart without angina pectoris 04/06/2007    Arliss Journey, PT, DPT  10/15/2020, 11:50 AM  [facility] [address] [place], [place],  [address] Phone: [phone]   Fax:  [phone]  Name: Larry Garner MRN: [id number] Date of Birth: 08/22/1955

## 2020-10-15 NOTE — Therapy (Signed)
[facility] [address] [place], [place], [address] Phone: [phone]   Fax:  [phone]  Occupational Therapy Treatment  Patient Details  Name: Larry Garner MRN: [id number] Date of Birth: [birth year] Referring Provider (OT): Bea Laura Redmond   Encounter Date: 10/15/2020   OT End of Session - 10/15/20 1513    Visit Number 14    Number of Visits 20    Date for OT Re-Evaluation 11/06/20    Authorization Type BCBS and Medicare    Authorization Time Period week 3 of 5 from renewal    Authorization - Visit Number 14    Authorization - Number of Visits 20    Progress Note Due on Visit 20    OT Start Time 1145    OT Stop Time 1230    OT Time Calculation (min) 45 min    Activity Tolerance Patient tolerated treatment well    Behavior During Therapy [facility] for tasks assessed/performed           Past Medical History:  Diagnosis Date  . Allergic rhinitis   . Allergy   . Anxiety   . Asthma   . CHF (congestive heart failure) ([place])   . Diabetes ([place])   . Diverticulitis   . Dyspnea   . GERD (gastroesophageal reflux disease)    very occ  . History of heart attack   . HLD (hyperlipidemia)   . HTN (hypertension)   . Myocardial infarction ([place]) 2000  . Sleep apnea    wears cpap   . Tubular adenoma of colon 09/2008    Past Surgical History:  Procedure Laterality Date  . CARDIAC CATHETERIZATION N/A 07/20/2016   Procedure: Left Heart Cath and Cors/Grafts Angiography;  Surgeon: Belva Crome, MD;  Location: [facility];  Service: Cardiovascular;  Laterality: N/A;  . CATARACT EXTRACTION Bilateral 06/06/2019   Dr. Tommy Rainwater. oct left, dec right 2020   . COLONOSCOPY    . CORONARY ANGIOPLASTY    . CORONARY ARTERY BYPASS GRAFT  06/1999  . POLYPECTOMY    . RENAL BIOPSY  06/2020  . TONSILLECTOMY     late50's early 60's    There were no vitals filed for this visit.   Subjective Assessment - 10/15/20 1229     Subjective  Patient tearful after SLP session realizing that he can not do mental calculations as he used to, and that he is facimg retirement    Patient is accompanied by: Family member    Limitations active renal cell cancer. fall risk. no driving    Patient Stated Goals I have trouble with dates and months and ordinal things    Currently in Pain? No/denies    Multiple Pain Sites No                        OT Treatments/Exercises (OP) - 10/15/20 0001      ADLs   Cooking Patient has been cooking full meals at home.  Patient able to report back regarding pork tenderloin and sauteed veggies.  Wife indicates cooking is as previous to stroke, patient concurs.      Hand Exercises   Other Hand Exercises Patient's hand strength has declined again since last checked, and since evaluation.  Patient has not been exercising with putty, and has active cancerous process.  Patient encouraged to use putty daily until visit - 3/16 - to determine if any change.  OT Education - 10/15/20 1512    Education Details anticipate discharge from OT for surgery,, then possible return to OT after reasonable recovery    Person(s) Educated Patient;Spouse    Methods Explanation    Comprehension Verbalized understanding            OT Short Term Goals - 10/15/20 1515      OT SHORT TERM GOAL #1   Title Pt will be independent with HEP 09/05/20    Time 3    Period Weeks    Status Achieved    Target Date 09/05/20      OT SHORT TERM GOAL #2   Title Pt will increase grip strength in LUE to 42 lbs or more for increase in functional use of LUE.    Baseline RUE 52.6 LUE 38.8    Time 3    Period Weeks    Status On-going   32.6 lbs LUE 09/29/20     OT SHORT TERM GOAL #3   Title Pt will perform simple warm meal prep with supervision for return to prior level of functioning    Time 3    Period Weeks    Status Achieved      OT SHORT TERM GOAL #4   Title Pt will increase  standing tolerance to 10 minutes or greater for increasing overall endurance and activity tolerance    Time 3    Period Weeks    Status Achieved             OT Long Term Goals - 10/15/20 1516      OT LONG TERM GOAL #1   Title *Pt will be independent with updated HEP for UE proximal strengthening and grip strengthening 12/08/2020    Time 10    Period Weeks    Status On-going      OT LONG TERM GOAL #2   Title *Pt will increase grip strength in LUE to 48 lbs or greater for increase in overall functional use of LUE    Baseline RUE 52.6 LUE 38.8    Time 6    Period Weeks    Status On-going   32.6lbs LUE     OT LONG TERM GOAL #3   Title Pt will complete 9 hole peg test in 32 seconds or less with LUE for increase in fine motor coordination.    Baseline RUE 28.69, LUE 37.03    Time 6    Period Weeks    Status Achieved   24.34     OT LONG TERM GOAL #4   Title Pt will complete mod complex meal prep with mod I for increase in return to prior level of functioning and home management and meal prep tasks    Time 6    Period Weeks    Status Achieved   completed at home with increased time.     OT LONG TERM GOAL #5   Title Pt will tolerate 15 minutes of continuous standing activities for increase in overall standing tolerance and activity tolerance for completing IADLs.    Time 6    Period Weeks    Status Achieved   pt is completing standing for > 15 minutes with cooking at home etc     OT LONG TERM GOAL #6   Title *Pt will complete physical and cognitive task simultaneously with 90% accuracy.    Time 6    Period Weeks    Status Achieved   75-80% with diff continuing walking with  thinking     OT LONG TERM GOAL #7   Title Complete FOTO at discharge    Time 6    Period Weeks    Status On-going                 Plan - 10/15/20 1211    Clinical Impression Statement Patient and wife excited for surgery (delayed since Dec due to stroke)  Patient with reduced hand strength  overall    OT Occupational Profile and History Problem Focused Assessment - Including review of records relating to presenting problem    Occupational performance deficits (Please refer to evaluation for details): IADL's;ADL's;Leisure    Body Structure / Function / Physical Skills ADL;Decreased knowledge of use of DME;Strength;GMC;Balance;UE functional use;IADL;ROM;Endurance;FMC;Coordination    Rehab Potential Good    Clinical Decision Making Limited treatment options, no task modification necessary    Comorbidities Affecting Occupational Performance: None    Modification or Assistance to Complete Evaluation  No modification of tasks or assist necessary to complete eval    OT Frequency 2x / week    OT Duration Other (comment)   2x/week for 5 weeks after renewal.   OT Treatment/Interventions Self-care/ADL training;DME and/or AE instruction;Therapeutic activities;Therapeutic exercise;Functional Mobility Training;Energy conservation;Patient/family education    Plan continue progressing towards goals, functional cognition    OT Home Exercise Plan coordination, putty, yellow resistance band    Consulted and Agree with Plan of Care Patient;Family member/caregiver    Family Member Consulted spouse, [name]           Patient will benefit from skilled therapeutic intervention in order to improve the following deficits and impairments:   Body Structure / Function / Physical Skills: ADL,Decreased knowledge of use of DME,Strength,GMC,Balance,UE functional use,IADL,ROM,Endurance,FMC,Coordination       Visit Diagnosis: 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 Nonischemic cardiomyopathy ([place])  10/06/2020  . Cryptogenic stroke ([place]) 10/06/2020  . Acute on chronic combined systolic and diastolic CHF (congestive heart failure) ([place]) 07/17/2020  . Renal cell carcinoma ([place]) 07/17/2020  . Protein-calorie malnutrition, severe 07/08/2020  . Acute respiratory failure with hypoxia ([place]) 07/05/2020  . Iron deficiency anemia due to chronic blood loss 06/19/2020  . OSA (obstructive sleep apnea) 01/09/2018  . Pulmonary nodule, left 03/15/2017  . Former smoker 11/04/2014  . Insomnia 05/27/2014  . Plantar wart of left foot 11/27/2010  . Type 2 diabetes mellitus with other specified complication ([place]) 60/05/9322  . PSA, INCREASED 09/15/2009  . Diverticulitis of colon 05/08/2009  . Asthma, moderate persistent 06/19/2008  . ANKLE PAIN, CHRONIC 06/05/2007  . Allergic rhinitis 05/17/2007  . ERECTILE DYSFUNCTION, SECONDARY TO MEDICATION 05/17/2007  . Hyperlipidemia 04/06/2007  . Anxiety state 04/06/2007  . Essential hypertension 04/06/2007  . Coronary artery disease involving native coronary artery of native heart without angina pectoris 04/06/2007    Mariah Milling, OTR/L 10/15/2020, 3:18 PM  [facility] [address] [place], [place], [address] Phone: [phone]   Fax:  [phone]  Name: SEMAJE KINKER MRN: [id number] Date of Birth: 1955-10-22

## 2020-10-15 NOTE — Patient Instructions (Signed)
  Please complete the assigned speech therapy homework prior to your next session and return it to the speech therapist at your next visit.  

## 2020-10-15 NOTE — Therapy (Signed)
[facility] [address] [place], [place], [address] Phone: [phone]   Fax:  [phone]  Speech Language Pathology Treatment  Patient Details  Name: Larry Garner MRN: [id number] Date of Birth: 1955-11-29 Referring Provider (SLP): Teresa Coombs, [place]   Encounter Date: 10/15/2020   End of Session - 10/15/20 1210    Visit Number 6    Number of Visits 17    Date for SLP Re-Evaluation 12/05/20    SLP Start Time 1104    SLP Stop Time  1145    SLP Time Calculation (min) 41 min    Activity Tolerance Patient tolerated treatment well           Past Medical History:  Diagnosis Date  . Allergic rhinitis   . Allergy   . Anxiety   . Asthma   . CHF (congestive heart failure) ([place])   . Diabetes ([place])   . Diverticulitis   . Dyspnea   . GERD (gastroesophageal reflux disease)    very occ  . History of heart attack   . HLD (hyperlipidemia)   . HTN (hypertension)   . Myocardial infarction ([place]) 2000  . Sleep apnea    wears cpap   . Tubular adenoma of colon 09/2008    Past Surgical History:  Procedure Laterality Date  . CARDIAC CATHETERIZATION N/A 07/20/2016   Procedure: Left Heart Cath and Cors/Grafts Angiography;  Surgeon: Belva Crome, MD;  Location: [facility];  Service: Cardiovascular;  Laterality: N/A;  . CATARACT EXTRACTION Bilateral 06/06/2019   Dr. Tommy Rainwater. oct left, dec right 2020   . COLONOSCOPY    . CORONARY ANGIOPLASTY    . CORONARY ARTERY BYPASS GRAFT  06/1999  . POLYPECTOMY    . RENAL BIOPSY  06/2020  . TONSILLECTOMY     late50's early 60's    There were no vitals filed for this visit.   Subjective Assessment - 10/15/20 1112    Subjective "It's with remembering the date." (pt, re: "difficulty with numbers.")    Patient is accompained by: Family member   Larry Garner   Currently in Pain? No/denies                 ADULT SLP TREATMENT - 10/15/20 1112      General  Information   Behavior/Cognition Alert;Cooperative;Pleasant mood      Treatment Provided   Treatment provided Cognitive-Linquistic      Cognitive-Linquistic Treatment   Treatment focused on Aphasia    Skilled Treatment SLP worked with pt on auditory comprehension with functional math. Pt req'd written cues to complete 100% of the time. Mild hesitations during 6 minutes complex conversation x3 today due to anomia.      Assessment / Recommendations / Plan   Plan Continue with current plan of care      Progression Toward Goals   Progression toward goals Progressing toward goals            SLP Education - 10/15/20 1209    Education Details writing things down is helpful with word problems    Person(s) Educated Patient    Methods Explanation;Demonstration;Verbal cues    Comprehension Verbalized understanding;Returned demonstration            SLP Short Term Goals - 10/15/20 1211      SLP SHORT TERM GOAL #1   Title pt will name 8 items in abstract categories in 5/6 opportunities in 3 sessions    Time 2  Period Weeks    Status On-going      SLP SHORT TERM GOAL #2   Title pt will describe to SLP 3 anomia compensations    Time 2    Period Weeks    Status On-going      SLP SHORT TERM GOAL #3   Title pt will verbally sequence pertinent routines for pt with modified independence in 3 sessions    Time 2    Period Weeks    Status On-going      SLP SHORT TERM GOAL #4   Title pt will participate functionally in 10 minutes mod complex conversation using compensations for anomia successfully    Baseline 10-15-20    Time 2    Period Weeks    Status On-going      SLP SHORT TERM GOAL #5   Title pt will participate in further testing as necessary (WAB-R or if pt desires, CLQT)    Period --   or 5 total sessions   Status Deferred            SLP Long Term Goals - 10/15/20 1211      SLP LONG TERM GOAL #1   Title pt will participate functionally in 20 minutes mod  complex/complex conversation using compensations for anomia successfully in 3 sessions    Time 6    Period Weeks   or 17 total sessions, for all LTGs   Status On-going      SLP LONG TERM GOAL #2   Title pt will express numbers with 100% success with self correction in 3 sessions    Time 6    Period Weeks    Status On-going      SLP LONG TERM GOAL #3   Title pt will listen to a 20 minute discourse (lecture, TED talk, etc) and functionally give a verbal synopsis with compensations (notes, anomia compensations, etc) in 3 sessions    Time 6    Period Weeks    Status On-going            Plan - 10/15/20 1210    Clinical Impression Statement Larry Garner presents today with reduced word finding ability in complex conversation. Pt also with reported attentional deficits described as inability to pay attention to long discourse now compared to pre-morbidly. Additionally pt states he may struggle with managin his own medication at this time, indicating reduced organization/critical thinking skills. SLP believes pt would cont to benefit from skilled ST to target expressive aphasia and if pt desires, cognitive communication/attention skills. He and Garner deny swallowing difficulties during meals.    Speech Therapy Frequency 2x / week    Duration 8 weeks   17   Treatment/Interventions Language facilitation;Cueing hierarchy;SLP instruction and feedback;Compensatory strategies;Patient/family education;Internal/external aids;Cognitive reorganization;Multimodal communcation approach;Functional tasks    Potential to Achieve Goals Good           Patient will benefit from skilled therapeutic intervention in order to improve the following deficits and impairments:   Aphasia  Cognitive communication deficit    Problem List Patient Active Problem List   Diagnosis Date Noted  . Nonischemic cardiomyopathy ([place]) 10/06/2020  . Cryptogenic stroke ([place]) 10/06/2020  . Acute on chronic combined systolic and  diastolic CHF (congestive heart failure) ([place]) 07/17/2020  . Renal cell carcinoma ([place]) 07/17/2020  . Protein-calorie malnutrition, severe 07/08/2020  . Acute respiratory failure with hypoxia ([place]) 07/05/2020  . Iron deficiency anemia due to chronic blood loss 06/19/2020  . OSA (obstructive sleep apnea) 01/09/2018  .  Pulmonary nodule, left 03/15/2017  . Former smoker 11/04/2014  . Insomnia 05/27/2014  . Plantar wart of left foot 11/27/2010  . Type 2 diabetes mellitus with other specified complication ([place]) 00/16/4290  . PSA, INCREASED 09/15/2009  . Diverticulitis of colon 05/08/2009  . Asthma, moderate persistent 06/19/2008  . ANKLE PAIN, CHRONIC 06/05/2007  . Allergic rhinitis 05/17/2007  . ERECTILE DYSFUNCTION, SECONDARY TO MEDICATION 05/17/2007  . Hyperlipidemia 04/06/2007  . Anxiety state 04/06/2007  . Essential hypertension 04/06/2007  . Coronary artery disease involving native coronary artery of native heart without angina pectoris 04/06/2007    [facility] ,MS, CCC-SLP  10/15/2020, 12:12 PM  [facility] [address] [place], [place], [address] Phone: [phone]   Fax:  [phone]   Name: Larry Garner MRN: [id number] Date of Birth: July 12, 1956

## 2020-10-16 ENCOUNTER — Ambulatory Visit: Payer: BC Managed Care – PPO | Admitting: Physical Therapy

## 2020-10-20 ENCOUNTER — Other Ambulatory Visit: Payer: Self-pay

## 2020-10-20 ENCOUNTER — Ambulatory Visit: Payer: BC Managed Care – PPO

## 2020-10-20 DIAGNOSIS — R41841 Cognitive communication deficit: Secondary | ICD-10-CM

## 2020-10-20 DIAGNOSIS — R4701 Aphasia: Secondary | ICD-10-CM

## 2020-10-20 DIAGNOSIS — M6281 Muscle weakness (generalized): Secondary | ICD-10-CM | POA: Diagnosis not present

## 2020-10-20 NOTE — Patient Instructions (Signed)
  Please complete the assigned speech therapy homework prior to your next session and return it to the speech therapist at your next visit.  

## 2020-10-20 NOTE — Therapy (Signed)
[facility] [address] [place], [place], [address] Phone: [phone]   Fax:  [phone]  Speech Language Pathology Treatment  Patient Details  Name: Larry Garner MRN: [id number] Date of Birth: [birth year] Referring Provider (SLP): Teresa Coombs,    Encounter Date: 10/20/2020   End of Session - 10/20/20 1231    Visit Number 7    Number of Visits 17    Date for SLP Re-Evaluation 12/05/20    SLP Start Time 1104    SLP Stop Time  1145    SLP Time Calculation (min) 41 min    Activity Tolerance Patient tolerated treatment well           Past Medical History:  Diagnosis Date  . Allergic rhinitis   . Allergy   . Anxiety   . Asthma   . CHF (congestive heart failure) ([place])   . Diabetes ([place])   . Diverticulitis   . Dyspnea   . GERD (gastroesophageal reflux disease)    very occ  . History of heart attack   . HLD (hyperlipidemia)   . HTN (hypertension)   . Myocardial infarction ([place]) 2000  . Sleep apnea    wears cpap   . Tubular adenoma of colon 09/2008    Past Surgical History:  Procedure Laterality Date  . CARDIAC CATHETERIZATION N/A 07/20/2016   Procedure: Left Heart Cath and Cors/Grafts Angiography;  Surgeon: Belva Crome, MD;  Location: [facility];  Service: Cardiovascular;  Laterality: N/A;  . CATARACT EXTRACTION Bilateral 06/06/2019   Dr. Tommy Rainwater. oct left, dec right 2020   . COLONOSCOPY    . CORONARY ANGIOPLASTY    . CORONARY ARTERY BYPASS GRAFT  06/1999  . POLYPECTOMY    . RENAL BIOPSY  06/2020  . TONSILLECTOMY     late50's early 60's    There were no vitals filed for this visit.   Subjective Assessment - 10/20/20 1106    Subjective "The homework went well. We did all but the last page."    Patient is accompained by: Family member   Larry Garner  - weife   Currently in Pain? No/denies                 ADULT SLP TREATMENT - 10/20/20 1107      General Information    Behavior/Cognition Alert;Cooperative;Pleasant mood      Treatment Provided   Treatment provided Cognitive-Linquistic      Cognitive-Linquistic Treatment   Treatment focused on Cognition;Aphasia    Skilled Treatment (pt, re: auditory comprehension homework) "I got one wrong on each (homework) sheet." For rehabbing Larry Garner's anomia today SLP assisted pt with more complex/abstract naming.  Pt consistently req'd max A with abstract divergent naming (5-8 items) after pt named 2-3 items. Phonemic cues were more successful than semantic cues.      Assessment / Recommendations / Plan   Plan Continue with current plan of care      Progression Toward Goals   Progression toward goals Progressing toward goals              SLP Short Term Goals - 10/20/20 1232      SLP SHORT TERM GOAL #1   Title pt will name 8 items in abstract categories in 5/6 opportunities in 3 sessions    Time 1    Period Weeks    Status On-going      SLP SHORT TERM GOAL #2   Title pt will describe  to SLP 3 anomia compensations    Time 1    Period Weeks    Status On-going      SLP SHORT TERM GOAL #3   Title pt will verbally sequence pertinent routines for pt with modified independence in 3 sessions    Time 1    Period Weeks    Status On-going      SLP SHORT TERM GOAL #4   Title pt will participate functionally in 10 minutes mod complex conversation using compensations for anomia successfully    Baseline 10-15-20    Status Achieved      SLP SHORT TERM GOAL #5   Title pt will participate in further testing as necessary (WAB-R or if pt desires, CLQT)    Period --   or 5 total sessions   Status Deferred            SLP Long Term Goals - 10/20/20 1232      SLP LONG TERM GOAL #1   Title pt will participate functionally in 20 minutes mod complex/complex conversation using compensations for anomia successfully in 3 sessions    Time 5    Period Weeks   or 17 total sessions, for all LTGs   Status On-going       SLP LONG TERM GOAL #2   Title pt will express numbers with 100% success with self correction in 3 sessions    Time 5    Period Weeks    Status On-going      SLP LONG TERM GOAL #3   Title pt will listen to a 20 minute discourse (lecture, TED talk, etc) and functionally give a verbal synopsis with compensations (notes, anomia compensations, etc) in 3 sessions    Time 5    Period Weeks    Status On-going            Plan - 10/20/20 1231    Clinical Impression Statement Larry Garner presents today with reduced word finding ability in complex conversation. Pt also with reported attentional deficits described as inability to pay attention to long discourse now compared to pre-morbidly. Additionally pt states he may struggle with managin his own medication at this time, indicating reduced organization/critical thinking skills. SLP believes pt would cont to benefit from skilled ST to target expressive aphasia and if pt desires, cognitive communication/attention skills. He and wife deny swallowing difficulties during meals.    Speech Therapy Frequency 2x / week    Duration 8 weeks   17   Treatment/Interventions Language facilitation;Cueing hierarchy;SLP instruction and feedback;Compensatory strategies;Patient/family education;Internal/external aids;Cognitive reorganization;Multimodal communcation approach;Functional tasks    Potential to Achieve Goals Good           Patient will benefit from skilled therapeutic intervention in order to improve the following deficits and impairments:   Aphasia  Cognitive communication deficit    Problem List Patient Active Problem List   Diagnosis Date Noted  . Nonischemic cardiomyopathy ([place]) 10/06/2020  . Cryptogenic stroke ([place]) 10/06/2020  . Acute on chronic combined systolic and diastolic CHF (congestive heart failure) ([place]) 07/17/2020  . Renal cell carcinoma ([place]) 07/17/2020  . Protein-calorie malnutrition, severe 07/08/2020  . Acute respiratory failure  with hypoxia ([place]) 07/05/2020  . Iron deficiency anemia due to chronic blood loss 06/19/2020  . OSA (obstructive sleep apnea) 01/09/2018  . Pulmonary nodule, left 03/15/2017  . Former smoker 11/04/2014  . Insomnia 05/27/2014  . Plantar wart of left foot 11/27/2010  . Type 2 diabetes mellitus with other specified complication (  [place]) 03/31/2010  . PSA, INCREASED 09/15/2009  . Diverticulitis of colon 05/08/2009  . Asthma, moderate persistent 06/19/2008  . ANKLE PAIN, CHRONIC 06/05/2007  . Allergic rhinitis 05/17/2007  . ERECTILE DYSFUNCTION, SECONDARY TO MEDICATION 05/17/2007  . Hyperlipidemia 04/06/2007  . Anxiety state 04/06/2007  . Essential hypertension 04/06/2007  . Coronary artery disease involving native coronary artery of native heart without angina pectoris 04/06/2007    [facility] ,[place], CCC-SLP  10/20/2020, 12:33 PM  [facility] [address] [place], [place], [address] Phone: [phone]   Fax:  [phone]   Name: Larry Garner MRN: [id number] Date of Birth: Sep 11, 1955

## 2020-10-22 ENCOUNTER — Ambulatory Visit: Payer: BC Managed Care – PPO

## 2020-10-22 ENCOUNTER — Other Ambulatory Visit: Payer: Self-pay

## 2020-10-22 ENCOUNTER — Encounter: Payer: Self-pay | Admitting: Occupational Therapy

## 2020-10-22 ENCOUNTER — Encounter: Payer: Self-pay | Admitting: Physical Therapy

## 2020-10-22 ENCOUNTER — Ambulatory Visit: Payer: BC Managed Care – PPO | Admitting: Occupational Therapy

## 2020-10-22 ENCOUNTER — Ambulatory Visit: Payer: BC Managed Care – PPO | Admitting: Physical Therapy

## 2020-10-22 DIAGNOSIS — M6281 Muscle weakness (generalized): Secondary | ICD-10-CM

## 2020-10-22 DIAGNOSIS — I69854 Hemiplegia and hemiparesis following other cerebrovascular disease affecting left non-dominant side: Secondary | ICD-10-CM

## 2020-10-22 DIAGNOSIS — R278 Other lack of coordination: Secondary | ICD-10-CM

## 2020-10-22 DIAGNOSIS — R2681 Unsteadiness on feet: Secondary | ICD-10-CM

## 2020-10-22 DIAGNOSIS — R41840 Attention and concentration deficit: Secondary | ICD-10-CM

## 2020-10-22 DIAGNOSIS — R41841 Cognitive communication deficit: Secondary | ICD-10-CM

## 2020-10-22 DIAGNOSIS — R4701 Aphasia: Secondary | ICD-10-CM

## 2020-10-22 NOTE — Therapy (Signed)
[facility] [address] [place], [place], [address] Phone: [phone]   Fax:  [phone]  Occupational Therapy Treatment  Patient Details  Name: Larry Garner MRN: [id number] Date of Birth: 03-17-1956 Referring Provider (OT): Bea Laura Redmond   Encounter Date: 10/22/2020   OT End of Session - 10/22/20 1233    Visit Number 15    Number of Visits 20    Date for OT Re-Evaluation 11/06/20    Authorization Type BCBS and Medicare    Authorization Time Period week 4 of 5    Authorization - Visit Number 15    Authorization - Number of Visits 20    Progress Note Due on Visit 20    OT Start Time 1145    OT Stop Time 1228    OT Time Calculation (min) 43 min    Activity Tolerance Patient tolerated treatment well    Behavior During Therapy [facility] for tasks assessed/performed           Past Medical History:  Diagnosis Date  . Allergic rhinitis   . Allergy   . Anxiety   . Asthma   . CHF (congestive heart failure) ([place])   . Diabetes ([place])   . Diverticulitis   . Dyspnea   . GERD (gastroesophageal reflux disease)    very occ  . History of heart attack   . HLD (hyperlipidemia)   . HTN (hypertension)   . Myocardial infarction ([place]) 2000  . Sleep apnea    wears cpap   . Tubular adenoma of colon 09/2008    Past Surgical History:  Procedure Laterality Date  . CARDIAC CATHETERIZATION N/A 07/20/2016   Procedure: Left Heart Cath and Cors/Grafts Angiography;  Surgeon: Belva Crome, MD;  Location: [facility];  Service: Cardiovascular;  Laterality: N/A;  . CATARACT EXTRACTION Bilateral 06/06/2019   Dr. Tommy Rainwater. oct left, dec right 2020   . COLONOSCOPY    . CORONARY ANGIOPLASTY    . CORONARY ARTERY BYPASS GRAFT  06/1999  . POLYPECTOMY    . RENAL BIOPSY  06/2020  . TONSILLECTOMY     late50's early 60's    There were no vitals filed for this visit.   Subjective Assessment - 10/22/20 1156    Subjective  I feel  that this is the area I have done the best - OT    Patient is accompanied by: Family member    Limitations active renal cell cancer. fall risk. no driving    Patient Stated Goals I have trouble with dates and months and ordinal things    Currently in Pain? No/denies                        OT Treatments/Exercises (OP) - 10/22/20 0001      ADLs   Cooking Patient able to recall step by step instructions for fried chicken and recount a funny story from the weekend.    ADL Comments Patient continues to have decreeased BUE hand grip, however does not notice this in functional context with carrying items, opening packages, slicing, peeling, etc.  Patient does have HEP to address hand strength, and he can continue to utilize to prevent UE strength functional issues.      Cognitive Exercises   Other Cognitive Exercises 1 Patient completed a matching game with field of 8 - 4 pairs with mod cueing.  Wife present and talked about working on strategies to intentionally hold info  on memory for immediate recall.  Discussed novel versus familiar tasks and importance associated as related to memory.  Speech therapy continues to work on cognition.                  OT Education - 10/22/20 1232    Education Details OT discharge until following surgery    Person(s) Educated Patient;Spouse    Methods Explanation    Comprehension Verbalized understanding            OT Short Term Goals - 10/22/20 1245      OT SHORT TERM GOAL #1   Title Pt will be independent with HEP 09/05/20    Time 3    Period Weeks    Status Achieved    Target Date 09/05/20      OT SHORT TERM GOAL #2   Title Pt will increase grip strength in LUE to 42 lbs or more for increase in functional use of LUE.    Baseline RUE 52.6 LUE 38.8    Time 3    Period Weeks    Status Not Met   32.6 lbs LUE 09/29/20     OT SHORT TERM GOAL #3   Title Pt will perform simple warm meal prep with supervision for return to prior  level of functioning    Time 3    Period Weeks    Status Achieved      OT SHORT TERM GOAL #4   Title Pt will increase standing tolerance to 10 minutes or greater for increasing overall endurance and activity tolerance    Time 3    Period Weeks    Status Achieved             OT Long Term Goals - 10/22/20 1245      OT LONG TERM GOAL #1   Title *Pt will be independent with updated HEP for UE proximal strengthening and grip strengthening 12/08/2020    Time 10    Period Weeks    Status Achieved      OT LONG TERM GOAL #2   Title *Pt will increase grip strength in LUE to 48 lbs or greater for increase in overall functional use of LUE    Baseline RUE 52.6 LUE 38.8    Time 6    Period Weeks    Status Not Met   32.6lbs LUE     OT LONG TERM GOAL #3   Title Pt will complete 9 hole peg test in 32 seconds or less with LUE for increase in fine motor coordination.    Baseline RUE 28.69, LUE 37.03    Time 6    Period Weeks    Status Achieved   24.34     OT LONG TERM GOAL #4   Title Pt will complete mod complex meal prep with mod I for increase in return to prior level of functioning and home management and meal prep tasks    Time 6    Period Weeks    Status Achieved   completed at home with increased time.     OT LONG TERM GOAL #5   Title Pt will tolerate 15 minutes of continuous standing activities for increase in overall standing tolerance and activity tolerance for completing IADLs.    Time 6    Period Weeks    Status Achieved   pt is completing standing for > 15 minutes with cooking at home etc     OT LONG TERM GOAL #6  Title *Pt will complete physical and cognitive task simultaneously with 90% accuracy.    Time 6    Period Weeks    Status Achieved   75-80% with diff continuing walking with thinking     OT LONG TERM GOAL #7   Title Complete FOTO at discharge    Time 6    Period Weeks    Status Deferred   Still working with PT                Plan - 10/22/20  1239    Clinical Impression Statement Patient and wife agreeable to OT discharge,   Patient continues with significant decrease in grip strength L>R, not showing improvement with intermittent strengthening via HEP.  Patient with active cancerous process, awaiting surgery.  Anticipate return to OT after recovery of surgery.    OT Occupational Profile and History Problem Focused Assessment - Including review of records relating to presenting problem    Occupational performance deficits (Please refer to evaluation for details): IADL's;ADL's;Leisure    Body Structure / Function / Physical Skills ADL;Decreased knowledge of use of DME;Strength;GMC;Balance;UE functional use;IADL;ROM;Endurance;FMC;Coordination    Rehab Potential Good    Clinical Decision Making Limited treatment options, no task modification necessary    Comorbidities Affecting Occupational Performance: None    Modification or Assistance to Complete Evaluation  No modification of tasks or assist necessary to complete eval    OT Frequency 2x / week    OT Duration Other (comment)   2x/week for 5 weeks after renewal.   OT Treatment/Interventions Self-care/ADL training;DME and/or AE instruction;Therapeutic activities;Therapeutic exercise;Functional Mobility Training;Energy conservation;Patient/family education    Plan discharge    OT Home Exercise Plan coordination, putty, yellow resistance band    Consulted and Agree with Plan of Care Patient;Family member/caregiver    Family Member Consulted spouse, [name]           Patient will benefit from skilled therapeutic intervention in order to improve the following deficits and impairments:   Body Structure / Function / Physical Skills: ADL,Decreased knowledge of use of DME,Strength,GMC,Balance,UE functional use,IADL,ROM,Endurance,FMC,Coordination       Visit Diagnosis: Muscle weakness (generalized)  Unsteadiness on feet  Other lack of coordination  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 Nonischemic cardiomyopathy ([place]) 10/06/2020  . Cryptogenic stroke ([place]) 10/06/2020  . Acute on chronic combined systolic and diastolic CHF (congestive heart failure) ([place]) 07/17/2020  . Renal cell carcinoma ([place]) 07/17/2020  . Protein-calorie malnutrition, severe 07/08/2020  . Acute respiratory failure with hypoxia ([place]) 07/05/2020  . Iron deficiency anemia due to chronic blood loss 06/19/2020  . OSA (obstructive sleep apnea) 01/09/2018  . Pulmonary nodule, left 03/15/2017  . Former smoker 11/04/2014  . Insomnia 05/27/2014  . Plantar wart of left foot 11/27/2010  . Type 2 diabetes mellitus with other specified complication ([place]) 09/73/5329  . PSA, INCREASED 09/15/2009  . Diverticulitis of colon 05/08/2009  . Asthma, moderate persistent 06/19/2008  . ANKLE PAIN, CHRONIC 06/05/2007  . Allergic rhinitis 05/17/2007  . ERECTILE DYSFUNCTION, SECONDARY TO MEDICATION 05/17/2007  . Hyperlipidemia 04/06/2007  . Anxiety state 04/06/2007  . Essential hypertension 04/06/2007  . Coronary artery disease involving native coronary artery of native heart without angina pectoris 04/06/2007  OCCUPATIONAL THERAPY DISCHARGE SUMMARY  Visits from Start of Care: 15  Current functional level related to goals / functional  outcomes: Improved independence with ADL/IADL due to improved activity tolerance, balance, functional cognition - sustained attention, sequencing, recall   Remaining deficits: Memory, aphasia, grip strength   Education / Equipment: UE strengthening  Plan: Patient agrees to discharge.  Patient goals were partially met. Patient is being discharged due to meeting the stated rehab goals.  ?????      Mariah Milling , OTR/L 10/22/2020, 12:47 PM  [facility] [address] [place], [place], [address] Phone: [phone]   Fax:  [phone]  Name: Larry Garner MRN: [id number] Date of Birth: Feb 25, 1956

## 2020-10-22 NOTE — Therapy (Signed)
[facility] [address] [place], [place], [address] Phone: [phone]   Fax:  [phone]  Speech Language Pathology Treatment  Patient Details  Name: Larry Garner MRN: [id number] Date of Birth: 1955/11/29 Referring Provider (SLP): Teresa Coombs, [place]   Encounter Date: 10/22/2020   End of Session - 10/22/20 1429    Visit Number 8    Number of Visits 17    Date for SLP Re-Evaluation 12/05/20    SLP Start Time 71    SLP Stop Time  1100    SLP Time Calculation (min) 42 min    Activity Tolerance Patient tolerated treatment well           Past Medical History:  Diagnosis Date  . Allergic rhinitis   . Allergy   . Anxiety   . Asthma   . CHF (congestive heart failure) ([place])   . Diabetes ([place])   . Diverticulitis   . Dyspnea   . GERD (gastroesophageal reflux disease)    very occ  . History of heart attack   . HLD (hyperlipidemia)   . HTN (hypertension)   . Myocardial infarction ([place]) 2000  . Sleep apnea    wears cpap   . Tubular adenoma of colon 09/2008    Past Surgical History:  Procedure Laterality Date  . CARDIAC CATHETERIZATION N/A 07/20/2016   Procedure: Left Heart Cath and Cors/Grafts Angiography;  Surgeon: Belva Crome, MD;  Location: [facility];  Service: Cardiovascular;  Laterality: N/A;  . CATARACT EXTRACTION Bilateral 06/06/2019   Dr. Tommy Rainwater. oct left, dec right 2020   . COLONOSCOPY    . CORONARY ANGIOPLASTY    . CORONARY ARTERY BYPASS GRAFT  06/1999  . POLYPECTOMY    . RENAL BIOPSY  06/2020  . TONSILLECTOMY     late50's early 60's    There were no vitals filed for this visit.   Subjective Assessment - 10/22/20 1108    Subjective "How are you today?"    Patient is accompained by: Family member   [name] - wife   Currently in Pain? No/denies                 ADULT SLP TREATMENT - 10/22/20 1108      General Information   Behavior/Cognition  Alert;Cooperative;Pleasant mood      Treatment Provided   Treatment provided Cognitive-Linquistic      Cognitive-Linquistic Treatment   Treatment focused on Cognition;Aphasia;Patient/family/caregiver education    Skilled Treatment Pt states "Totally dependent" when asked about his independence with medication. SLP talked with pt/wife about slowly handing off med administration to pt. [name] brings AM meds to pt, but PM meds are 50/50 pt reminding and wife getting meds. SLP assisted pt set alarms for 8AM and 8PM in his phone - usual mod A req'd fading to occasional min A needed. Then SLP walked pt and wife through how to have pt fill med box. Cued wife to not give pt answers but to ask pt to double check his work.      Assessment / Recommendations / Plan   Plan Continue with current plan of care      Progression Toward Goals   Progression toward goals Progressing toward goals            SLP Education - 10/22/20 1428    Education Details slow handoff for medication administration    Person(s) Educated Patient;Spouse    Methods Explanation;Demonstration    Comprehension Verbalized  understanding;Need further instruction            SLP Short Term Goals - 10/22/20 1430      SLP SHORT TERM GOAL #1   Title pt will name 8 items in abstract categories in 5/6 opportunities in 3 sessions    Status Partially Met      SLP [place] #2   Title pt will describe to SLP 3 anomia compensations    Status Deferred      SLP SHORT TERM GOAL #3   Title pt will verbally sequence pertinent routines for pt with modified independence in 3 sessions    Status Partially Met      SLP SHORT TERM GOAL #4   Title pt will participate functionally in 10 minutes mod complex conversation using compensations for anomia successfully    Baseline 10-15-20    Status Achieved      SLP SHORT TERM GOAL #5   Title pt will participate in further testing as necessary (WAB-R or if pt desires, CLQT)    Period --    or 5 total sessions   Status Deferred            SLP Long Term Goals - 10/22/20 1431      SLP LONG TERM GOAL #1   Title pt will participate functionally in 20 minutes mod complex/complex conversation using compensations for anomia successfully in 3 sessions    Time 5    Period Weeks   or 17 total sessions, for all LTGs   Status On-going      SLP LONG TERM GOAL #2   Title pt will express numbers with 100% success with self correction in 3 sessions    Time 5    Period Weeks    Status On-going      SLP LONG TERM GOAL #3   Title pt will listen to a 20 minute discourse (lecture, TED talk, etc) and functionally give a verbal synopsis with compensations (notes, anomia compensations, etc) in 3 sessions    Time 5    Period Weeks    Status On-going            Plan - 10/22/20 1429    Clinical Impression Statement Larry Garner presents today with reduced word finding ability in complex conversation. Pt also with reported attentional deficits described as inability to pay attention to long discourse now compared to pre-morbidly. Pt attemtion to detail seen as difficult today. Pt assisted pt/wife with medication management today. SLP believes pt would cont to benefit from skilled ST to target expressive aphasia and if pt desires, cognitive communication/attention skills. He and wife deny swallowing difficulties during meals.    Speech Therapy Frequency 2x / week    Duration 8 weeks   17   Treatment/Interventions Language facilitation;Cueing hierarchy;SLP instruction and feedback;Compensatory strategies;Patient/family education;Internal/external aids;Cognitive reorganization;Multimodal communcation approach;Functional tasks    Potential to Achieve Goals Good           Patient will benefit from skilled therapeutic intervention in order to improve the following deficits and impairments:   Aphasia  Cognitive communication deficit    Problem List Patient Active Problem List   Diagnosis Date  Noted  . Nonischemic cardiomyopathy ([place]) 10/06/2020  . Cryptogenic stroke ([place]) 10/06/2020  . Acute on chronic combined systolic and diastolic CHF (congestive heart failure) ([place]) 07/17/2020  . Renal cell carcinoma ([place]) 07/17/2020  . Protein-calorie malnutrition, severe 07/08/2020  . Acute respiratory failure with hypoxia ([place]) 07/05/2020  . Iron deficiency  anemia due to chronic blood loss 06/19/2020  . OSA (obstructive sleep apnea) 01/09/2018  . Pulmonary nodule, left 03/15/2017  . Former smoker 11/04/2014  . Insomnia 05/27/2014  . Plantar wart of left foot 11/27/2010  . Type 2 diabetes mellitus with other specified complication ([place]) 69/48/5462  . PSA, INCREASED 09/15/2009  . Diverticulitis of colon 05/08/2009  . Asthma, moderate persistent 06/19/2008  . ANKLE PAIN, CHRONIC 06/05/2007  . Allergic rhinitis 05/17/2007  . ERECTILE DYSFUNCTION, SECONDARY TO MEDICATION 05/17/2007  . Hyperlipidemia 04/06/2007  . Anxiety state 04/06/2007  . Essential hypertension 04/06/2007  . Coronary artery disease involving native coronary artery of native heart without angina pectoris 04/06/2007    [facility] ,MS, CCC-SLP  10/22/2020, 2:32 PM  [facility] [address] [place], [place], [address] Phone: [phone]   Fax:  [phone]   Name: Larry Garner MRN: [id number] Date of Birth: 1956-05-26

## 2020-10-22 NOTE — Therapy (Signed)
[facility] [address] [place], [place], [address] Phone: [phone]   Fax:  [phone]  Physical Therapy Treatment  Patient Details  Name: Larry Garner MRN: [id number] Date of Birth: [birth year] Referring Provider (PT): Redmond, Bea Laura, NP   Encounter Date: 10/22/2020   PT End of Session - 10/22/20 1724    Visit Number 16    Number of Visits 17    Date for PT Re-Evaluation 11/25/20    Authorization Type BCBS, Medicare    PT Start Time 1233    PT Stop Time 1314   4 minutes non billable due to pt in restroom   PT Time Calculation (min) 41 min    Equipment Utilized During Treatment Gait belt    Activity Tolerance Patient tolerated treatment well    Behavior During Therapy [id number] for tasks assessed/performed           Past Medical History:  Diagnosis Date  . Allergic rhinitis   . Allergy   . Anxiety   . Asthma   . CHF (congestive heart failure) ([place])   . Diabetes ([place])   . Diverticulitis   . Dyspnea   . GERD (gastroesophageal reflux disease)    very occ  . History of heart attack   . HLD (hyperlipidemia)   . HTN (hypertension)   . Myocardial infarction ([place]) 2000  . Sleep apnea    wears cpap   . Tubular adenoma of colon 09/2008    Past Surgical History:  Procedure Laterality Date  . CARDIAC CATHETERIZATION N/A 07/20/2016   Procedure: Left Heart Cath and Cors/Grafts Angiography;  Surgeon: Belva Crome, MD;  Location: [facility];  Service: Cardiovascular;  Laterality: N/A;  . CATARACT EXTRACTION Bilateral 06/06/2019   Dr. Tommy Rainwater. oct left, dec right 2020   . COLONOSCOPY    . CORONARY ANGIOPLASTY    . CORONARY ARTERY BYPASS GRAFT  06/1999  . POLYPECTOMY    . RENAL BIOPSY  06/2020  . TONSILLECTOMY     late50's early 60's    There were no vitals filed for this visit.   Subjective Assessment - 10/22/20 1234    Subjective Sees the surgeon on april 4th. The MD appt this past week went  well.    Patient is accompained by: Family member   Larry Garner   Pertinent History L MCA CVA, Coronary Artery Disease, Heart Failure, Hyperlipidemia, Osteoarthritis, GERD, Renal Cell Cancer requiring nephrectomy (active)    Limitations Standing;Walking;Other (comment)   stairs   How long can you walk comfortably? approx. 100' with RW    Patient Stated Goals go up and down the stairs by himself, find out what his new normal will be, work on strength and flexbility.    Currently in Pain? No/denies                             [facility] Adult PT Treatment/Exercise - 10/22/20 1249      Transfers   Comments 30 second chair stand: 8 sit <> stands with BUE support from standard mat table      Ambulation/Gait   Ambulation/Gait Yes    Ambulation/Gait Assistance 5: Supervision    Ambulation/Gait Assistance Details with SPC with quad tip between activities    Assistive device Straight cane   with 4prong tip   Gait Pattern Step-through pattern;Decreased stance time - right    Ambulation Surface Level;Indoor    Gait velocity  17.34 seconds - 1.89 ft/sec    Stairs Yes    Stairs Assistance 5: Supervision    Stairs Assistance Details (indicate cue type and reason) initial cues for sequencing with stepping up with stronger LLE first, pt with improvement with incr reps    Stair Management Technique One rail Right;Step to pattern;With cane;Forwards   with cane in LUE   Number of Stairs 4   x4   Height of Stairs 6      Exercises   Exercises Other Exercises    Other Exercises  with BUE support on chair: x10 reps mini squats - with verbal and demo cues for tehcnique,  with 3# ankle weights: x10 reps heel raises, x7 reps B step out with one leg and back in and repeating with other side                  PT Education - 10/22/20 1721    Education Details progress towards goals, plan to re-cert 2x week for an additional 4 weeks, getting scheduled for additional appts    Person(s) Educated  Patient;Spouse    Methods Explanation    Comprehension Verbalized understanding            PT Short Term Goals - 09/24/20 1157      PT SHORT TERM GOAL #1   Title Pt will be independent with initial HEP and verbalize understanding of decr fall risk in the home. ALL STGS DUE 09/24/20    Baseline Independent    Time 4    Period Weeks    Status Achieved    Target Date 09/24/20      PT SHORT TERM GOAL #2   Title Pt will improve BERG to at least a 49/56 in order to demo decr fall risk.    Baseline BERG 44/56, 50/56 on 09/22/20    Time 4    Period Weeks    Status Achieved      PT SHORT TERM GOAL #3   Title Pt will perform 5x sit <> stand in 20 seconds or less with single UE support in order to demo incr balance.    Baseline 25 seconds, 24.78 with BUE support from arm chair, 4/10 RPE afterwards on 09/22/20    Time 4    Period Weeks    Status Not Met      PT SHORT TERM GOAL #4   Title Pt will improve gait speed with RW vs. LRAD to at least 2.4 ft/sec in order to demo improved community mobility.    Baseline 2.00 ft/sec; 1.60 ft/sec with LRAD (SPC)    Time 4    Period Weeks    Status Not Met      PT SHORT TERM GOAL #5   Title Pt will perform TUG with no AD in 17 seconds or less in order to demo decr fall risk.    Baseline 20.63 seconds, 17.5 seconds with SPC with quad tip on 09/22/20    Time 4    Period Weeks    Status Not Met             PT Long Term Goals - 10/22/20 1255      PT LONG TERM GOAL #1   Title Pt will be independent with final HEP in order to build upon functional gains made in therapy. ALL LTGS DUE 10/22/20    Time 8    Period Weeks    Status New      PT LONG TERM  GOAL #2   Title BERG/DGI goal to be written as appropriate.    Time 8    Period Weeks    Status New      PT LONG TERM GOAL #3   Title Pt will improve gait speed with LRAD vs. no AD to at least 2.7 ft/sec in order to demo improved community mobility.    Baseline 2.00 ft/sec, 1.89 seconds with  SPC with quad tip (improved from 1.60 ft/sec with LRAD)    Time 8    Period Weeks    Status Not Met      PT LONG TERM GOAL #4   Title Pt will perform TUG with no AD in 13.5 seconds or less in order to demo decr fall risk.    Baseline 20.63 seconds, 12.62 with SPC with quad tip on 10/22/20    Time 8    Period Weeks    Status Partially Met      PT LONG TERM GOAL #5   Title Pt will ambulate at least 500' with supervision over indoor/outdoor surfaces with LRAD in order to demo improved community mobility.    Time 8    Period Weeks    Status New      PT LONG TERM GOAL #6   Title Pt will ambulate 12 steps with single handrail and step to vs. step through pattern with supervision in order to safely go up/down the stairs in his house.    Baseline 12 steps with single handrail and step to with cane, with supervision.    Time 8    Period Weeks    Status Achieved               Plan - 10/22/20 1731    Clinical Impression Statement Began to assess pt's LTGs today. Pt met LTG #6 in regards to stairs - able to ambulate 12 steps with single handrail and step to pattern with cane with supervision, does still need initial cues for proper sequencing. Pt improved TUG time to 12.62 seconds with SPC (previously was 20.63 seconds). Pt's gait speed with cane incr to 1.89 ft/sec, improved from 1.60 ft/sec, but not quite to goal level, decr pt's risk of falls. Due to pt's progress and continued impairments with gait,balance, strength - will re-cert for an additional 2x week for 4 weeks after checking remainder of LTGs at next session.    Personal Factors and Comorbidities Comorbidity 3+;Past/Current Experience    Comorbidities L MCA CVA, Coronary Artery Disease, Heart Failure, Hyperlipidemia, Osteoarthritis, GERD, Renal Cell Cancer requiring nephrectomy (active)    Examination-Activity Limitations Locomotion Level;Stand;Transfers;Stairs    Examination-Participation Restrictions Community  Activity;Occupation   work (is a Customer service manager)   [employer] Stable/Uncomplicated    Rehab Potential Good    PT Frequency 2x / week    PT Duration 8 weeks    PT Treatment/Interventions ADLs/Self Care Home Management;Stair training;Gait training;DME Instruction;Functional mobility training;Therapeutic activities;Therapeutic exercise;Balance training;Neuromuscular re-education;Patient/family education;Orthotic Fit/Training;Passive range of motion;Vestibular;Energy conservation    PT Next Visit Plan check remainder of LTGs. re-cert. balance on compliant surfaces. balance with head motions, esp nods. RLE strength. Scifit for aerobic warm up    PT Home Exercise Plan added eccentric sitting to HEP, 5 reps per set    Consulted and Agree with Plan of Care Patient;Family member/caregiver    Family Member Consulted wife, Larry Garner           Patient will benefit from skilled therapeutic intervention in order to improve the following  deficits and impairments:  Abnormal gait,Decreased activity tolerance,Decreased balance,Decreased endurance,Decreased knowledge of use of DME,Decreased range of motion,Decreased strength,Difficulty walking,Decreased safety awareness  Visit Diagnosis: Unsteadiness on feet  Muscle weakness (generalized)  Other lack of coordination  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 Nonischemic cardiomyopathy ([place]) 10/06/2020  . Cryptogenic stroke ([place]) 10/06/2020  . Acute on chronic combined systolic and diastolic CHF (congestive heart failure) ([place]) 07/17/2020  . Renal cell carcinoma ([place]) 07/17/2020  . Protein-calorie malnutrition, severe 07/08/2020  . Acute respiratory failure with hypoxia ([place]) 07/05/2020  . Iron deficiency anemia due to chronic blood loss 06/19/2020  . OSA (obstructive sleep apnea) 01/09/2018  .  Pulmonary nodule, left 03/15/2017  . Former smoker 11/04/2014  . Insomnia 05/27/2014  . Plantar wart of left foot 11/27/2010  . Type 2 diabetes mellitus with other specified complication ([place]) 68/15/9470  . PSA, INCREASED 09/15/2009  . Diverticulitis of colon 05/08/2009  . Asthma, moderate persistent 06/19/2008  . ANKLE PAIN, CHRONIC 06/05/2007  . Allergic rhinitis 05/17/2007  . ERECTILE DYSFUNCTION, SECONDARY TO MEDICATION 05/17/2007  . Hyperlipidemia 04/06/2007  . Anxiety state 04/06/2007  . Essential hypertension 04/06/2007  . Coronary artery disease involving native coronary artery of native heart without angina pectoris 04/06/2007    Arliss Journey, PT,DPT  10/22/2020, 5:31 PM  [facility] [address] [place], [place], [address] Phone: [phone]   Fax:  [phone]  Name: Larry Garner MRN: [id number] Date of Birth: February 03, 1956

## 2020-10-24 ENCOUNTER — Ambulatory Visit: Payer: BC Managed Care – PPO | Admitting: Occupational Therapy

## 2020-10-24 ENCOUNTER — Ambulatory Visit: Payer: BC Managed Care – PPO | Admitting: Physical Therapy

## 2020-10-24 ENCOUNTER — Encounter: Payer: Self-pay | Admitting: Physical Therapy

## 2020-10-24 ENCOUNTER — Other Ambulatory Visit: Payer: Self-pay

## 2020-10-24 DIAGNOSIS — R2681 Unsteadiness on feet: Secondary | ICD-10-CM

## 2020-10-24 DIAGNOSIS — M6281 Muscle weakness (generalized): Secondary | ICD-10-CM

## 2020-10-24 DIAGNOSIS — R278 Other lack of coordination: Secondary | ICD-10-CM

## 2020-10-24 DIAGNOSIS — R2689 Other abnormalities of gait and mobility: Secondary | ICD-10-CM

## 2020-10-24 NOTE — Therapy (Signed)
[facility] [address] [place], [place], [address] Phone: [phone]   Fax:  [phone]  Physical Therapy Treatment/Re-Cert  Patient Details  Name: Larry Garner MRN: [id number] Date of Birth: 25-Apr-1956 Referring Provider (PT): Redmond, Bea Laura, NP   Encounter Date: 10/24/2020   PT End of Session - 10/24/20 1610    Visit Number 17    Number of Visits 25    Date for PT Re-Evaluation 89/16/94   cert done on 12/10/86   Authorization Type BCBS, Medicare    PT Start Time 1017    PT Stop Time 1057    PT Time Calculation (min) 40 min    Equipment Utilized During Treatment Gait belt    Activity Tolerance Patient tolerated treatment well    Behavior During Therapy [facility] for tasks assessed/performed           Past Medical History:  Diagnosis Date  . Allergic rhinitis   . Allergy   . Anxiety   . Asthma   . CHF (congestive heart failure) ([place])   . Diabetes ([place])   . Diverticulitis   . Dyspnea   . GERD (gastroesophageal reflux disease)    very occ  . History of heart attack   . HLD (hyperlipidemia)   . HTN (hypertension)   . Myocardial infarction ([place]) 2000  . Sleep apnea    wears cpap   . Tubular adenoma of colon 09/2008    Past Surgical History:  Procedure Laterality Date  . CARDIAC CATHETERIZATION N/A 07/20/2016   Procedure: Left Heart Cath and Cors/Grafts Angiography;  Surgeon: Belva Crome, MD;  Location: [facility];  Service: Cardiovascular;  Laterality: N/A;  . CATARACT EXTRACTION Bilateral 06/06/2019   Dr. Tommy Rainwater. oct left, dec right 2020   . COLONOSCOPY    . CORONARY ANGIOPLASTY    . CORONARY ARTERY BYPASS GRAFT  06/1999  . POLYPECTOMY    . RENAL BIOPSY  06/2020  . TONSILLECTOMY     late50's early 60's    There were no vitals filed for this visit.   Subjective Assessment - 10/24/20 1019    Subjective No changes since he was last here.    Patient is accompained by: Family member    beth   Pertinent History L MCA CVA, Coronary Artery Disease, Heart Failure, Hyperlipidemia, Osteoarthritis, GERD, Renal Cell Cancer requiring nephrectomy (active)    Limitations Standing;Walking;Other (comment)   stairs   How long can you walk comfortably? approx. 100' with RW    Patient Stated Goals go up and down the stairs by himself, find out what his new normal will be, work on strength and flexbility.    Currently in Pain? No/denies              [facility] PT Assessment - 10/24/20 1023      Assessment   Medical Diagnosis L MCA CVA    Referring Provider (PT) Redmond, Bea Laura, NP      Standardized Balance Assessment   Standardized Balance Assessment Dynamic Gait Index      Berg Balance Test   Sit to Stand Able to stand  independently using hands    Standing Unsupported Able to stand safely 2 minutes    Sitting with Back Unsupported but Feet Supported on Floor or Stool Able to sit safely and securely 2 minutes    Stand to Sit Sits safely with minimal use of hands    Transfers Able to transfer safely, minor use  of hands    Standing Unsupported with Eyes Closed Able to stand 10 seconds safely    Standing Unsupported with Feet Together Able to place feet together independently and stand 1 minute safely    From Standing, Reach Forward with Outstretched Arm Can reach confidently >25 cm (10")    From Standing Position, Pick up Object from Floor Able to pick up shoe safely and easily    From Standing Position, Turn to Look Behind Over each Shoulder Looks behind from both sides and weight shifts well    Turn 360 Degrees Able to turn 360 degrees safely one side only in 4 seconds or less    Standing Unsupported, Alternately Place Feet on Step/Stool Able to stand independently and safely and complete 8 steps in 20 seconds    Standing Unsupported, One Foot in Front Able to plae foot ahead of the other independently and hold 30 seconds    Standing on One Leg Tries to lift leg/unable to hold 3  seconds but remains standing independently    Total Score 50    Berg comment: 50/56      Dynamic Gait Index   Level Surface Mild Impairment    Change in Gait Speed Mild Impairment    Gait with Horizontal Head Turns Moderate Impairment    Gait with Vertical Head Turns Mild Impairment    Gait and Pivot Turn Moderate Impairment    Step Over Obstacle Mild Impairment    Step Around Obstacles Mild Impairment    Steps Mild Impairment    Total Score 14    DGI comment: 14/24                         [facility] Adult PT Treatment/Exercise - 10/24/20 1049      Transfers   Transfers Sit to Stand;Stand to Sit    Sit to Stand 4: Min guard;5: Supervision    Stand to Sit 5: Supervision    Comments from elevated mat table:  2 x 3 reps with no UE support - cues for controlled descent, pt unable to perform another after 3rd rep      Ambulation/Gait   Ambulation/Gait Yes    Ambulation/Gait Assistance 4: Min guard;5: Supervision    Ambulation/Gait Assistance Details cues for incr foot clearance, esp on outdoor paved surfaces and for proper cane placement, tendency at times to leave it to far behind    Ambulation Distance (Feet) 500 Feet    Assistive device Straight cane   with 4 prong tip   Gait Pattern Step-through pattern;Decreased stance time - right    Ambulation Surface Unlevel;Level;Outdoor;Paved                    PT Short Term Goals - 09/24/20 1157      PT SHORT TERM GOAL #1   Title Pt will be independent with initial HEP and verbalize understanding of decr fall risk in the home. ALL STGS DUE 09/24/20    Baseline Independent    Time 4    Period Weeks    Status Achieved    Target Date 09/24/20      PT SHORT TERM GOAL #2   Title Pt will improve BERG to at least a 49/56 in order to demo decr fall risk.    Baseline BERG 44/56, 50/56 on 09/22/20    Time 4    Period Weeks    Status Achieved      PT SHORT  TERM GOAL #3   Title Pt will perform 5x sit <> stand in 20  seconds or less with single UE support in order to demo incr balance.    Baseline 25 seconds, 24.78 with BUE support from arm chair, 4/10 RPE afterwards on 09/22/20    Time 4    Period Weeks    Status Not Met      PT SHORT TERM GOAL #4   Title Pt will improve gait speed with RW vs. LRAD to at least 2.4 ft/sec in order to demo improved community mobility.    Baseline 2.00 ft/sec; 1.60 ft/sec with LRAD (SPC)    Time 4    Period Weeks    Status Not Met      PT SHORT TERM GOAL #5   Title Pt will perform TUG with no AD in 17 seconds or less in order to demo decr fall risk.    Baseline 20.63 seconds, 17.5 seconds with SPC with quad tip on 09/22/20    Time 4    Period Weeks    Status Not Met             PT Long Term Goals - 10/24/20 1021      PT LONG TERM GOAL #1   Title Pt will be independent with final HEP in order to build upon functional gains made in therapy. ALL LTGS DUE 10/22/20    Baseline pt reports performing exercises more consistently at home.    Time 8    Period Weeks    Status Achieved      PT LONG TERM GOAL #2   Title BERG/DGI goal to be written as appropriate.    Baseline 50/56 on 10/24/20 (same as last time), 14/24 on DGI on 10/24/20 - LTG to be written for re-cert    Time 8    Period Weeks    Status Achieved      PT LONG TERM GOAL #3   Title Pt will improve gait speed with LRAD vs. no AD to at least 2.7 ft/sec in order to demo improved community mobility.    Baseline 2.00 ft/sec, 1.89 seconds with SPC with quad tip (improved from 1.60 ft/sec with LRAD)    Time 8    Period Weeks    Status Not Met      PT LONG TERM GOAL #4   Title Pt will perform TUG with no AD in 13.5 seconds or less in order to demo decr fall risk.    Baseline 20.63 seconds, 12.62 with SPC with quad tip on 10/22/20    Time 8    Period Weeks    Status Partially Met      PT LONG TERM GOAL #5   Title Pt will ambulate at least 500' with supervision over indoor/outdoor surfaces with LRAD in  order to demo improved community mobility.    Baseline with supervision/min guard with spc with 4 prong tip.    Time 8    Period Weeks    Status Partially Met      PT LONG TERM GOAL #6   Title Pt will ambulate 12 steps with single handrail and step to vs. step through pattern with supervision in order to safely go up/down the stairs in his house.    Baseline 12 steps with single handrail and step to with cane, with supervision.    Time 8    Period Weeks    Status Achieved  Revised/ongoing LTGs for re-cert:      PT Long Term Goals - 10/24/20 1618      PT LONG TERM GOAL #1   Title Pt will be independent with final HEP in order to build upon functional gains made in therapy. ALL LTGS DUE 11/21/20    Baseline pt reports performing exercises more consistently at home - will benefit from ongoing additions.    Time 4    Period Weeks    Status On-going    Target Date 11/21/20      PT LONG TERM GOAL #2   Title Pt will improve DGI to at least 16/24 in order to demo decr fall risk.    Baseline 14/24 on DGI on 10/24/20    Time 4    Period Weeks    Status Revised      PT LONG TERM GOAL #3   Title Pt will improve gait speed with LRAD to at least 2.2 ft/sec in order to demo improved community mobility.    Baseline 1.89 seconds with SPC with quad tip (improved from 1.60 ft/sec with LRAD)    Time 4    Period Weeks    Status Revised      PT LONG TERM GOAL #4   Title Pt will perform at least 10 sit <> stands during 30 escond chair stand with BUE support in order to demo improved BLE strength.    Baseline 30 second chair stand: 8 sit <> stands with BUE support from standard mat table    Time 4    Period Weeks    Status New      PT LONG TERM GOAL #5   Title Pt will ambulate at least 500' with supervision over indoor/outdoor surfaces with LRAD and perform a curb in order to demo improved community mobility.    Baseline with supervision/min guard with spc with 4 prong tip.     Time 4    Period Weeks    Status Revised                10/24/20 1611  Plan  Clinical Impression Statement Assessed remainder of LTGs today. Pt had same BERG score today as a 50/56 (no change from last assessed), pt still unable to perform sit <> stands without UE support, unable to perform tandem stance without UE support, needs assist for SLS, and still slowed turns in R/L direction. Performed the DGI today with pt scoring a 14/24, indicating that pt is at an incr risk for falls (performed with SPC with quad tip). Pt able to ambulate 500' outdoors with [facility] with quad tip with supervision/min guard for balance. Will re-cert for an additional 2x week for 4 weeks to continue to work on strength, balance, gait, transfers in order to improve functional mobility and independence and decr fall risk. LTGs revised and updated as appropriate.  Personal Factors and Comorbidities Comorbidity 3+;Past/Current Experience  Comorbidities L MCA CVA, Coronary Artery Disease, Heart Failure, Hyperlipidemia, Osteoarthritis, GERD, Renal Cell Cancer requiring nephrectomy (active)  Examination-Activity Limitations Locomotion Level;Stand;Transfers;Stairs  Examination-Participation Restrictions Community Activity;Occupation (work (is a Customer service manager))  Pt will benefit from skilled therapeutic intervention in order to improve on the following deficits Abnormal gait;Decreased activity tolerance;Decreased balance;Decreased endurance;Decreased knowledge of use of DME;Decreased range of motion;Decreased strength;Difficulty walking;Decreased safety awareness  Stability/Clinical Decision Making Stable/Uncomplicated  Rehab Potential Good  PT Frequency 2x / week  PT Duration 4 weeks  PT Treatment/Interventions ADLs/Self Care Home Management;Stair training;Gait training;DME Instruction;Functional mobility training;Therapeutic activities;Therapeutic  exercise;Balance training;Neuromuscular re-education;Patient/family education;Orthotic  Fit/Training;Passive range of motion;Vestibular;Energy conservation  PT Next Visit Plan stair training. balance on compliant surfaces. balance with head motions, esp nods. RLE strength. Scifit for aerobic warm up  PT Home Exercise Plan added eccentric sitting to HEP, 5 reps per set  Consulted and Agree with Plan of Care Patient;Family member/caregiver  Family Member Consulted wife, [name]    Patient will benefit from skilled therapeutic intervention in order to improve the following deficits and impairments:  Abnormal gait,Decreased activity tolerance,Decreased balance,Decreased endurance,Decreased knowledge of use of DME,Decreased range of motion,Decreased strength,Difficulty walking,Decreased safety awareness  Visit Diagnosis: Unsteadiness on feet  Muscle weakness (generalized)  Other lack of coordination  Other abnormalities of gait and mobility     Problem List Patient Active Problem List   Diagnosis Date Noted  . Nonischemic cardiomyopathy ([place]) 10/06/2020  . Cryptogenic stroke ([place]) 10/06/2020  . Acute on chronic combined systolic and diastolic CHF (congestive heart failure) ([place]) 07/17/2020  . Renal cell carcinoma ([place]) 07/17/2020  . Protein-calorie malnutrition, severe 07/08/2020  . Acute respiratory failure with hypoxia ([place]) 07/05/2020  . Iron deficiency anemia due to chronic blood loss 06/19/2020  . OSA (obstructive sleep apnea) 01/09/2018  . Pulmonary nodule, left 03/15/2017  . Former smoker 11/04/2014  . Insomnia 05/27/2014  . Plantar wart of left foot 11/27/2010  . Type 2 diabetes mellitus with other specified complication ([place]) 66/44/0347  . PSA, INCREASED 09/15/2009  . Diverticulitis of colon 05/08/2009  . Asthma, moderate persistent 06/19/2008  . ANKLE PAIN, CHRONIC 06/05/2007  . Allergic rhinitis 05/17/2007  . ERECTILE DYSFUNCTION, SECONDARY TO MEDICATION 05/17/2007  . Hyperlipidemia 04/06/2007  . Anxiety state 04/06/2007  . Essential hypertension  04/06/2007  . Coronary artery disease involving native coronary artery of native heart without angina pectoris 04/06/2007    Arliss Journey, PT, DPT  10/24/2020, 4:12 PM  [facility] [address] [place], [place], [address] Phone: [phone]   Fax:  [phone]  Name: MACINTYRE ALEXA MRN: [id number] Date of Birth: [birth year]

## 2020-10-27 ENCOUNTER — Ambulatory Visit: Payer: BC Managed Care – PPO

## 2020-10-27 ENCOUNTER — Encounter: Payer: Self-pay | Admitting: Physical Therapy

## 2020-10-27 ENCOUNTER — Other Ambulatory Visit: Payer: Self-pay

## 2020-10-27 ENCOUNTER — Ambulatory Visit: Payer: BC Managed Care – PPO | Admitting: Physical Therapy

## 2020-10-27 ENCOUNTER — Ambulatory Visit: Payer: BC Managed Care – PPO | Admitting: Occupational Therapy

## 2020-10-27 DIAGNOSIS — M6281 Muscle weakness (generalized): Secondary | ICD-10-CM | POA: Diagnosis not present

## 2020-10-27 DIAGNOSIS — R2681 Unsteadiness on feet: Secondary | ICD-10-CM

## 2020-10-27 DIAGNOSIS — R2689 Other abnormalities of gait and mobility: Secondary | ICD-10-CM

## 2020-10-27 DIAGNOSIS — R41841 Cognitive communication deficit: Secondary | ICD-10-CM

## 2020-10-27 DIAGNOSIS — R278 Other lack of coordination: Secondary | ICD-10-CM

## 2020-10-27 DIAGNOSIS — R4701 Aphasia: Secondary | ICD-10-CM

## 2020-10-27 NOTE — Therapy (Signed)
[facility] [address] [place], [place], [address] Phone: [phone]   Fax:  [phone]  Physical Therapy Treatment  Patient Details  Name: Larry Garner MRN: [id number] Date of Birth: 05-17-1956 Referring Provider (PT): Redmond, Bea Laura, NP   Encounter Date: 10/27/2020   PT End of Session - 10/27/20 1640    Visit Number 18    Number of Visits 25    Date for PT Re-Evaluation 37/10/62   cert done on 6/94/85   Authorization Type BCBS, Medicare    PT Start Time 1403    PT Stop Time 1443    PT Time Calculation (min) 40 min    Equipment Utilized During Treatment Gait belt    Activity Tolerance Patient tolerated treatment well    Behavior During Therapy [facility] for tasks assessed/performed           Past Medical History:  Diagnosis Date  . Allergic rhinitis   . Allergy   . Anxiety   . Asthma   . CHF (congestive heart failure) ([place])   . Diabetes ([place])   . Diverticulitis   . Dyspnea   . GERD (gastroesophageal reflux disease)    very occ  . History of heart attack   . HLD (hyperlipidemia)   . HTN (hypertension)   . Myocardial infarction ([place]) 2000  . Sleep apnea    wears cpap   . Tubular adenoma of colon 09/2008    Past Surgical History:  Procedure Laterality Date  . CARDIAC CATHETERIZATION N/A 07/20/2016   Procedure: Left Heart Cath and Cors/Grafts Angiography;  Surgeon: Belva Crome, MD;  Location: [facility];  Service: Cardiovascular;  Laterality: N/A;  . CATARACT EXTRACTION Bilateral 06/06/2019   Dr. Tommy Rainwater. oct left, dec right 2020   . COLONOSCOPY    . CORONARY ANGIOPLASTY    . CORONARY ARTERY BYPASS GRAFT  06/1999  . POLYPECTOMY    . RENAL BIOPSY  06/2020  . TONSILLECTOMY     late50's early 60's    There were no vitals filed for this visit.   Subjective Assessment - 10/27/20 1406    Subjective No changes since he was last here. Doing good, doing well.    Patient is accompained by:  Family member   beth   Pertinent History L MCA CVA, Coronary Artery Disease, Heart Failure, Hyperlipidemia, Osteoarthritis, GERD, Renal Cell Cancer requiring nephrectomy (active)    Limitations Standing;Walking;Other (comment)   stairs   How long can you walk comfortably? approx. 100' with RW    Patient Stated Goals go up and down the stairs by himself, find out what his new normal will be, work on strength and flexbility.    Currently in Pain? No/denies                             [facility] Adult PT Treatment/Exercise - 10/27/20 1410      Ambulation/Gait   Ambulation/Gait Yes    Ambulation/Gait Assistance 5: Supervision    Ambulation/Gait Assistance Details throughout session between activities    Assistive device Straight cane   with 4 prong tip   Gait Pattern Step-through pattern;Decreased stance time - right    Ambulation Surface Level;Indoor      Exercises   Exercises Other Exercises    Other Exercises  in // bars with red tband around thighs: forward marching x3 reps  - beginning with UE support and then none, cues  for ROM, retro gait x3 reps - cues for step length, side stepping R/L while holding mini squat down and back x2 reps      Knee/Hip Exercises: Aerobic   Stepper SciFit with BUE and BLE for strengthening, activity tolerance at gear 2.8 for 5 minutes. O2 at 100%, HR at 82 bpm afterwards               Balance Exercises - 10/27/20 0001      Balance Exercises: Standing   Rockerboard Lateral;EO    Rockerboard Limitations weight shifting R/L with no UE support x12 reps, then trying to keep board still x10 reps head nods with min guard/min A and intermittent taps to bars for balance    Tandem Gait Forward;Limitations;2 reps    Tandem Gait Limitations down and back in // bars, needing fingertip support    Other Standing Exercises standing on blue air ex: x10 reps alternating heel taps each leg down to floor with BUE support    Other Standing Exercises  Comments on blue air ex: step up/up and step down/down, begining with UE support and then none x15 reps each leg, pt with incr difficulty sequencing               PT Short Term Goals - 10/24/20 1616      PT SHORT TERM GOAL #1   Title ALL STGS = LTGS             PT Long Term Goals - 10/24/20 1618      PT LONG TERM GOAL #1   Title Pt will be independent with final HEP in order to build upon functional gains made in therapy. ALL LTGS DUE 11/21/20    Baseline pt reports performing exercises more consistently at home - will benefit from ongoing additions.    Time 4    Period Weeks    Status On-going    Target Date 11/21/20      PT LONG TERM GOAL #2   Title Pt will improve DGI to at least 16/24 in order to demo decr fall risk.    Baseline 14/24 on DGI on 10/24/20    Time 4    Period Weeks    Status Revised      PT LONG TERM GOAL #3   Title Pt will improve gait speed with LRAD to at least 2.2 ft/sec in order to demo improved community mobility.    Baseline 1.89 seconds with SPC with quad tip (improved from 1.60 ft/sec with LRAD)    Time 4    Period Weeks    Status Revised      PT LONG TERM GOAL #4   Title Pt will perform at least 10 sit <> stands during 30 escond chair stand with BUE support in order to demo improved BLE strength.    Baseline 30 second chair stand: 8 sit <> stands with BUE support from standard mat table    Time 4    Period Weeks    Status New      PT LONG TERM GOAL #5   Title Pt will ambulate at least 500' with supervision over indoor/outdoor surfaces with LRAD and perform a curb in order to demo improved community mobility.    Baseline with supervision/min guard with spc with 4 prong tip.    Time 4    Period Weeks    Status Revised  Plan - 10/27/20 1700    Clinical Impression Statement Today's skilled session focused on BLE strengthening and standing balance strategies with decr UE support in // bars. Pt challenged with  sequencing with step up/up and down/down on blue air ex, needing frequent verbal and demo cues for alternating pattern and incr foot clearance. Will continue to progress towards LTGs.    Personal Factors and Comorbidities Comorbidity 3+;Past/Current Experience    Comorbidities L MCA CVA, Coronary Artery Disease, Heart Failure, Hyperlipidemia, Osteoarthritis, GERD, Renal Cell Cancer requiring nephrectomy (active)    Examination-Activity Limitations Locomotion Level;Stand;Transfers;Stairs    Examination-Participation Restrictions Community Activity;Occupation   work (is a Customer service manager)   [employer] Stable/Uncomplicated    Rehab Potential Good    PT Frequency 2x / week    PT Duration 4 weeks    PT Treatment/Interventions ADLs/Self Care Home Management;Stair training;Gait training;DME Instruction;Functional mobility training;Therapeutic activities;Therapeutic exercise;Balance training;Neuromuscular re-education;Patient/family education;Orthotic Fit/Training;Passive range of motion;Vestibular;Energy conservation    PT Next Visit Plan balance on compliant surfaces, SLS, and with decr UE support. balance with head motions, esp nods. RLE strength. Scifit for aerobic warm up    PT Home Exercise Plan added eccentric sitting to HEP, 5 reps per set    Consulted and Agree with Plan of Care Patient;Family member/caregiver    Family Member Consulted wife, [name]           Patient will benefit from skilled therapeutic intervention in order to improve the following deficits and impairments:  Abnormal gait,Decreased activity tolerance,Decreased balance,Decreased endurance,Decreased knowledge of use of DME,Decreased range of motion,Decreased strength,Difficulty walking,Decreased safety awareness  Visit Diagnosis: Unsteadiness on feet  Muscle weakness (generalized)  Other lack of coordination  Other abnormalities of gait and mobility     Problem List Patient Active Problem List    Diagnosis Date Noted  . Nonischemic cardiomyopathy ([place]) 10/06/2020  . Cryptogenic stroke ([place]) 10/06/2020  . Acute on chronic combined systolic and diastolic CHF (congestive heart failure) ([place]) 07/17/2020  . Renal cell carcinoma ([place]) 07/17/2020  . Protein-calorie malnutrition, severe 07/08/2020  . Acute respiratory failure with hypoxia ([place]) 07/05/2020  . Iron deficiency anemia due to chronic blood loss 06/19/2020  . OSA (obstructive sleep apnea) 01/09/2018  . Pulmonary nodule, left 03/15/2017  . Former smoker 11/04/2014  . Insomnia 05/27/2014  . Plantar wart of left foot 11/27/2010  . Type 2 diabetes mellitus with other specified complication ([place]) 86/57/8469  . PSA, INCREASED 09/15/2009  . Diverticulitis of colon 05/08/2009  . Asthma, moderate persistent 06/19/2008  . ANKLE PAIN, CHRONIC 06/05/2007  . Allergic rhinitis 05/17/2007  . ERECTILE DYSFUNCTION, SECONDARY TO MEDICATION 05/17/2007  . Hyperlipidemia 04/06/2007  . Anxiety state 04/06/2007  . Essential hypertension 04/06/2007  . Coronary artery disease involving native coronary artery of native heart without angina pectoris 04/06/2007    Arliss Journey, PT, DPT  10/27/2020, 5:01 PM  [facility] [address] [place], [place], [address] Phone: [phone]   Fax:  [phone]  Name: Larry Garner MRN: [id number] Date of Birth: August 30, 1955

## 2020-10-27 NOTE — Therapy (Signed)
[facility] [address] [place], [place], [address] Phone: [phone]   Fax:  [phone]  Speech Language Pathology Treatment  Patient Details  Name: Larry Garner MRN: [id number] Date of Birth: 07-30-1956 Referring Provider (SLP): Teresa Coombs,    Encounter Date: 10/27/2020   End of Session - 10/27/20 1117    Visit Number 9    Number of Visits 17    Date for SLP Re-Evaluation 12/05/20    SLP Start Time 1103    SLP Stop Time  1145    SLP Time Calculation (min) 42 min    Activity Tolerance Patient tolerated treatment well           Past Medical History:  Diagnosis Date  . Allergic rhinitis   . Allergy   . Anxiety   . Asthma   . CHF (congestive heart failure) ([place])   . Diabetes ([place])   . Diverticulitis   . Dyspnea   . GERD (gastroesophageal reflux disease)    very occ  . History of heart attack   . HLD (hyperlipidemia)   . HTN (hypertension)   . Myocardial infarction ([place]) 2000  . Sleep apnea    wears cpap   . Tubular adenoma of colon 09/2008    Past Surgical History:  Procedure Laterality Date  . CARDIAC CATHETERIZATION N/A 07/20/2016   Procedure: Left Heart Cath and Cors/Grafts Angiography;  Surgeon: Belva Crome, MD;  Location: [facility];  Service: Cardiovascular;  Laterality: N/A;  . CATARACT EXTRACTION Bilateral 06/06/2019   Dr. Tommy Rainwater. oct left, dec right 2020   . COLONOSCOPY    . CORONARY ANGIOPLASTY    . CORONARY ARTERY BYPASS GRAFT  06/1999  . POLYPECTOMY    . RENAL BIOPSY  06/2020  . TONSILLECTOMY     late50's early 60's    There were no vitals filed for this visit.   Subjective Assessment - 10/27/20 1106    Subjective "How was your weekend?"    Patient is accompained by: Family member   [name]--wife   Currently in Pain? No/denies                 ADULT SLP TREATMENT - 10/27/20 1107      General Information   Behavior/Cognition  Alert;Cooperative;Pleasant mood      Treatment Provided   Treatment provided Cognitive-Linquistic      Cognitive-Linquistic Treatment   Treatment focused on Cognition;Aphasia;Patient/family/caregiver education    Skilled Treatment "We moved them (the medications) this morning". Pt, when asked to be more specific, stated, "We are slowly changing over to this being my responsibility."SLP had pt tell the steps involved to fill his medication box - pt with slower verbal Tree surgeon. Pt counted meds and a double-checked for accuracy.Pt's verbalization was not as precise as wife's description. Pt recalled what days he has to take whole pills of one med - pt has half-pills the other 5 days/week. "Your method of organization was very helpful" -pt. Wife "I felt really comfortable with what he did." Pt told SLP he wants to know function of each med. SLP initiated a google search for function of each of his meds and had him say each function with the name for the med, and write each of them down. Pt req'd extra time consistently.      Assessment / Recommendations / Plan   Plan Continue with current plan of care      Progression Toward Goals  Progression toward goals Progressing toward goals              SLP Short Term Goals - 10/27/20 1148      SLP SHORT TERM GOAL #1   Title pt will name 8 items in abstract categories in 5/6 opportunities in 3 sessions    Status Partially Met      SLP SHORT TERM GOAL #2   Title pt will describe to SLP 3 anomia compensations    Status Deferred      SLP SHORT TERM GOAL #3   Title pt will verbally sequence pertinent routines for pt with modified independence in 3 sessions    Status Partially Met      SLP SHORT TERM GOAL #4   Title pt will participate functionally in 10 minutes mod complex conversation using compensations for anomia successfully    Baseline 10-15-20    Status Achieved      SLP SHORT TERM GOAL #5   Title pt will participate in  further testing as necessary (WAB-R or if pt desires, CLQT)    Period --   or 5 total sessions   Status Deferred            SLP Long Term Goals - 10/27/20 1148      SLP LONG TERM GOAL #1   Title pt will participate functionally in 20 minutes mod complex/complex conversation using compensations for anomia successfully in 3 sessions    Time 4    Period Weeks   or 17 total sessions, for all LTGs   Status On-going      SLP LONG TERM GOAL #2   Title pt will express numbers with 100% success with self correction in 3 sessions    Time 4    Period Weeks    Status On-going      SLP LONG TERM GOAL #3   Title pt will listen to a 20 minute discourse (lecture, TED talk, etc) and functionally give a verbal synopsis with compensations (notes, anomia compensations, etc) in 3 sessions    Time 4    Period Weeks    Status On-going            Plan - 10/27/20 [place] presents today with reduced word finding ability in complex conversation. Pt also with reported attentional deficits described as inability to pay attention to long discourse now compared to pre-morbidly. Pt attemtion to detail seen as difficult today. Pt assisted pt/wife with medication management today. SLP believes pt would cont to benefit from skilled ST to target expressive aphasia and if pt desires, cognitive communication/attention skills. He and wife deny swallowing difficulties during meals.    Speech Therapy Frequency 2x / week    Duration 8 weeks   17   Treatment/Interventions Language facilitation;Cueing hierarchy;SLP instruction and feedback;Compensatory strategies;Patient/family education;Internal/external aids;Cognitive reorganization;Multimodal communcation approach;Functional tasks    Potential to Achieve Goals Good           Patient will benefit from skilled therapeutic intervention in order to improve the following deficits and impairments:   Aphasia  Cognitive communication  deficit    Problem List Patient Active Problem List   Diagnosis Date Noted  . Nonischemic cardiomyopathy ([place]) 10/06/2020  . Cryptogenic stroke ([place]) 10/06/2020  . Acute on chronic combined systolic and diastolic CHF (congestive heart failure) ([place]) 07/17/2020  . Renal cell carcinoma ([place]) 07/17/2020  . Protein-calorie malnutrition, severe 07/08/2020  . Acute respiratory failure with hypoxia ([place])  07/05/2020  . Iron deficiency anemia due to chronic blood loss 06/19/2020  . OSA (obstructive sleep apnea) 01/09/2018  . Pulmonary nodule, left 03/15/2017  . Former smoker 11/04/2014  . Insomnia 05/27/2014  . Plantar wart of left foot 11/27/2010  . Type 2 diabetes mellitus with other specified complication ([place]) 18/98/4210  . PSA, INCREASED 09/15/2009  . Diverticulitis of colon 05/08/2009  . Asthma, moderate persistent 06/19/2008  . ANKLE PAIN, CHRONIC 06/05/2007  . Allergic rhinitis 05/17/2007  . ERECTILE DYSFUNCTION, SECONDARY TO MEDICATION 05/17/2007  . Hyperlipidemia 04/06/2007  . Anxiety state 04/06/2007  . Essential hypertension 04/06/2007  . Coronary artery disease involving native coronary artery of native heart without angina pectoris 04/06/2007    [facility] ,MS, CCC-SLP  10/27/2020, 11:48 AM  [facility] [address] [place], [place], [address] Phone: [phone]   Fax:  [phone]   Name: TYRICK DUNAGAN MRN: [id number] Date of Birth: 03/10/1956

## 2020-10-27 NOTE — Patient Instructions (Signed)
Make a chart of your meds including name, color, shape, and function. Bring this back next session.

## 2020-10-29 ENCOUNTER — Ambulatory Visit: Payer: BC Managed Care – PPO

## 2020-10-29 ENCOUNTER — Other Ambulatory Visit: Payer: Self-pay

## 2020-10-29 ENCOUNTER — Ambulatory Visit: Payer: BC Managed Care – PPO | Admitting: Occupational Therapy

## 2020-10-29 VITALS — HR 78

## 2020-10-29 DIAGNOSIS — M6281 Muscle weakness (generalized): Secondary | ICD-10-CM

## 2020-10-29 DIAGNOSIS — R41841 Cognitive communication deficit: Secondary | ICD-10-CM

## 2020-10-29 DIAGNOSIS — R4701 Aphasia: Secondary | ICD-10-CM

## 2020-10-29 DIAGNOSIS — R2681 Unsteadiness on feet: Secondary | ICD-10-CM

## 2020-10-29 NOTE — Therapy (Signed)
[facility] [address] [place], [place], [address] Phone: [phone]   Fax:  [phone]  Physical Therapy Treatment  Patient Details  Name: Larry Garner MRN: [id number] Date of Birth: [birth year] Referring Provider (PT): Red[place], Bea Laura, NP   Encounter Date: 10/29/2020   PT End of Session - 10/29/20 1244    Visit Number 19    Number of Visits 25    Date for PT Re-Evaluation 12/23/20    Authorization Type BCBS, Medicare    PT Start Time 1155    PT Stop Time 1240    PT Time Calculation (min) 45 min    Equipment Utilized During Treatment Gait belt    Activity Tolerance Patient tolerated treatment well    Behavior During Therapy [facility] for tasks assessed/performed           Past Medical History:  Diagnosis Date  . Allergic rhinitis   . Allergy   . Anxiety   . Asthma   . CHF (congestive heart failure) ([place])   . Diabetes ([place])   . Diverticulitis   . Dyspnea   . GERD (gastroesophageal reflux disease)    very occ  . History of heart attack   . HLD (hyperlipidemia)   . HTN (hypertension)   . Myocardial infarction ([place]) 2000  . Sleep apnea    wears cpap   . Tubular adenoma of colon 09/2008    Past Surgical History:  Procedure Laterality Date  . CARDIAC CATHETERIZATION N/A 07/20/2016   Procedure: Left Heart Cath and Cors/Grafts Angiography;  Surgeon: Belva Crome, MD;  Location: [facility];  Service: Cardiovascular;  Laterality: N/A;  . CATARACT EXTRACTION Bilateral 06/06/2019   Dr. Tommy Rainwater. oct left, dec right 2020   . COLONOSCOPY    . CORONARY ANGIOPLASTY    . CORONARY ARTERY BYPASS GRAFT  06/1999  . POLYPECTOMY    . RENAL BIOPSY  06/2020  . TONSILLECTOMY     late50's early 36's    Vitals:   10/29/20 1207  Pulse: 78  SpO2: 97%     Subjective Assessment - 10/29/20 1208    Subjective No falls to report, no med changes    Patient is accompained by: Family member   beth   Pertinent  History L MCA CVA, Coronary Artery Disease, Heart Failure, Hyperlipidemia, Osteoarthritis, GERD, Renal Cell Cancer requiring nephrectomy (active)    Limitations Standing;Walking;Other (comment)   stairs   How long can you walk comfortably? approx. 100' with RW    Patient Stated Goals go up and down the stairs by himself, find out what his new normal will be, work on strength and flexbility.    Currently in Pain? No/denies                             [facility] Adult PT Treatment/Exercise - 10/29/20 0001      Ambulation/Gait   Ambulation/Gait Yes    Ambulation/Gait Assistance 4: Min guard    Ambulation/Gait Assistance Details had patient step over and back across orange stripes    Ambulation Distance (Feet) 230 Feet    Assistive device Straight cane    Gait Pattern Step-through pattern    Ambulation Surface Level;Indoor      Knee/Hip Exercises: Aerobic   Stepper SciFit with BUE 2.8 resistance x 6'      Knee/Hip Exercises: Standing   Forward Step Up Both;1 set;10 reps;Hand Hold: 2;Step Height: 4"  Forward Step Up Limitations standing on foam, steppig onto 4" step 10x per leg, then from solid surface to cushioned 4" step 10x per LE               Balance Exercises - 10/29/20 0001      Balance Exercises: Standing   Sidestepping 4 reps;Theraband;Limitations    Theraband Level (Sidestepping) Level 2 (Red)    Sidestepping Limitations 4 trips in // bars against red band in partial squat position    Marching Solid surface;Upper extremity assist 2;Dynamic;Forwards;Retro;Limitations    Marching Limitations performed 4 trips marching fwd and bwd, BUE support against red band    Other Standing Exercises standing on Airex in //bars hip flexion to target across bars 10x per side               PT Short Term Goals - 10/24/20 1616      PT SHORT TERM GOAL #1   Title ALL STGS = LTGS             PT Long Term Goals - 10/24/20 1618      PT LONG TERM GOAL #1    Title Pt will be independent with final HEP in order to build upon functional gains made in therapy. ALL LTGS DUE 11/21/20    Baseline pt reports performing exercises more consistently at home - will benefit from ongoing additions.    Time 4    Period Weeks    Status On-going    Target Date 11/21/20      PT LONG TERM GOAL #2   Title Pt will improve DGI to at least 16/24 in order to demo decr fall risk.    Baseline 14/24 on DGI on 10/24/20    Time 4    Period Weeks    Status Revised      PT LONG TERM GOAL #3   Title Pt will improve gait speed with LRAD to at least 2.2 ft/sec in order to demo improved community mobility.    Baseline 1.89 seconds with SPC with quad tip (improved from 1.60 ft/sec with LRAD)    Time 4    Period Weeks    Status Revised      PT LONG TERM GOAL #4   Title Pt will perform at least 10 sit <> stands during 30 escond chair stand with BUE support in order to demo improved BLE strength.    Baseline 30 second chair stand: 8 sit <> stands with BUE support from standard mat table    Time 4    Period Weeks    Status New      PT LONG TERM GOAL #5   Title Pt will ambulate at least 500' with supervision over indoor/outdoor surfaces with LRAD and perform a curb in order to demo improved community mobility.    Baseline with supervision/min guard with spc with 4 prong tip.    Time 4    Period Weeks    Status Revised                 Plan - 10/29/20 1245    Clinical Impression Statement focus of todays skilled session was stepping strategies from solid and foam surfaces, continued strengthening through functional tasks such as marching, retrowalking and sidestepping.  Fatigued easily, O2 sats monitored and found to be >98%, some difficulty with foot placement when fatigued    Personal Factors and Comorbidities Comorbidity 3+;Past/Current Experience    Comorbidities L MCA CVA, Coronary Artery Disease,  Heart Failure, Hyperlipidemia, Osteoarthritis, GERD, Renal Cell  Cancer requiring nephrectomy (active)    Examination-Activity Limitations Locomotion Level;Stand;Transfers;Stairs    Examination-Participation Restrictions Community Activity;Occupation   work (is a Customer service manager)   [employer] Stable/Uncomplicated    Rehab Potential Good    PT Frequency 2x / week    PT Duration 4 weeks    PT Treatment/Interventions ADLs/Self Care Home Management;Stair training;Gait training;DME Instruction;Functional mobility training;Therapeutic activities;Therapeutic exercise;Balance training;Neuromuscular re-education;Patient/family education;Orthotic Fit/Training;Passive range of motion;Vestibular;Energy conservation    PT Next Visit Plan SLS, and with decr UE support. balance with head motions, esp nods. RLE strength and steping tasks. Scifit for aerobic warm up check STGs    PT Home Exercise Plan added eccentric sitting to HEP, 5 reps per set    Consulted and Agree with Plan of Care Patient;Family member/caregiver    Family Member Consulted wife, [name]           Patient will benefit from skilled therapeutic intervention in order to improve the following deficits and impairments:  Abnormal gait,Decreased activity tolerance,Decreased balance,Decreased endurance,Decreased knowledge of use of DME,Decreased range of motion,Decreased strength,Difficulty walking,Decreased safety awareness  Visit Diagnosis: Unsteadiness on feet  Muscle weakness (generalized)     Problem List Patient Active Problem List   Diagnosis Date Noted  . Nonischemic cardiomyopathy ([place]) 10/06/2020  . Cryptogenic stroke ([place]) 10/06/2020  . Acute on chronic combined systolic and diastolic CHF (congestive heart failure) ([place]) 07/17/2020  . Renal cell carcinoma ([place]) 07/17/2020  . Protein-calorie malnutrition, severe 07/08/2020  . Acute respiratory failure with hypoxia ([place]) 07/05/2020  . Iron deficiency anemia due to chronic blood loss 06/19/2020  . OSA (obstructive sleep apnea)  01/09/2018  . Pulmonary nodule, left 03/15/2017  . Former smoker 11/04/2014  . Insomnia 05/27/2014  . Plantar wart of left foot 11/27/2010  . Type 2 diabetes mellitus with other specified complication ([place]) 40/05/2724  . PSA, INCREASED 09/15/2009  . Diverticulitis of colon 05/08/2009  . Asthma, moderate persistent 06/19/2008  . ANKLE PAIN, CHRONIC 06/05/2007  . Allergic rhinitis 05/17/2007  . ERECTILE DYSFUNCTION, SECONDARY TO MEDICATION 05/17/2007  . Hyperlipidemia 04/06/2007  . Anxiety state 04/06/2007  . Essential hypertension 04/06/2007  . Coronary artery disease involving native coronary artery of native heart without angina pectoris 04/06/2007    Lanice Shirts PT 10/29/2020, 1:05 PM  [facility] [address] [place], [place], [address] Phone: [phone]   Fax:  [phone]  Name: Larry Garner MRN: [id number] Date of Birth: 04/07/1956

## 2020-10-29 NOTE — Therapy (Signed)
[facility] [address] [place], [place], [address] Phone: [phone]   Fax:  [phone]  Speech Language Pathology Treatment/Progress Note  Patient Details  Name: Larry Garner MRN: [id number] Date of Birth: [birth year] Referring Provider (SLP): Teresa Coombs, [place]   Encounter Date: 10/29/2020   End of Session - 10/29/20 1217    Visit Number 10    Number of Visits 17    Date for SLP Re-Evaluation 12/05/20    SLP Start Time 1104    SLP Stop Time  1145    SLP Time Calculation (min) 41 min    Activity Tolerance Patient tolerated treatment well           Past Medical History:  Diagnosis Date  . Allergic rhinitis   . Allergy   . Anxiety   . Asthma   . CHF (congestive heart failure) ([place])   . Diabetes ([place])   . Diverticulitis   . Dyspnea   . GERD (gastroesophageal reflux disease)    very occ  . History of heart attack   . HLD (hyperlipidemia)   . HTN (hypertension)   . Myocardial infarction ([place]) 2000  . Sleep apnea    wears cpap   . Tubular adenoma of colon 09/2008    Past Surgical History:  Procedure Laterality Date  . CARDIAC CATHETERIZATION N/A 07/20/2016   Procedure: Left Heart Cath and Cors/Grafts Angiography;  Surgeon: Belva Crome, MD;  Location: [facility];  Service: Cardiovascular;  Laterality: N/A;  . CATARACT EXTRACTION Bilateral 06/06/2019   Dr. Tommy Rainwater. oct left, dec right 2020   . COLONOSCOPY    . CORONARY ANGIOPLASTY    . CORONARY ARTERY BYPASS GRAFT  06/1999  . POLYPECTOMY    . RENAL BIOPSY  06/2020  . TONSILLECTOMY     late50's early 60's    There were no vitals filed for this visit.   Subjective Assessment - 10/29/20 1112    Subjective "Is that rain on your window?"    Patient is accompained by: Family member   [name] - wife   Currently in Pain? No/denies                 ADULT SLP TREATMENT - 10/29/20 1114      General Information   Behavior/Cognition  Alert;Cooperative;Pleasant mood      Treatment Provided   Treatment provided Cognitive-Linquistic      Cognitive-Linquistic Treatment   Treatment focused on Cognition    Skilled Treatment Bernis brought his med list in that SLP asked pt to compile, adn wife said pt initiated this completion. "It had been on my mind (that it needed to get done)." SLP encouraged pt that he might not have recalled to do the med homework 4 weeks ago without wife reminding him. SLPintiated task for listening comprehension and written expression, and verbal expression with pt taking notes on a 7 minute section of documentary. Pt req'd cues topress pause to assist in comprehension and give himself time for written expression. During his verbal synopsis, pt verbal expression shifted to personal opinion and then returned to items/summary of documentary. During verbal summary, pt req'd extra time for anomia and sentence construction with detailed language to adequately describe documentary details. Pt to complete this task at home.      Assessment / Recommendations / Plan   Plan Continue with current plan of care      Progression Toward Goals   Progression toward goals Progressing  toward goals              SLP Short Term Goals - 10/27/20 1148      SLP SHORT TERM GOAL #1   Title pt will name 8 items in abstract categories in 5/6 opportunities in 3 sessions    Status Partially Met      SLP SHORT TERM GOAL #2   Title pt will describe to SLP 3 anomia compensations    Status Deferred      SLP SHORT TERM GOAL #3   Title pt will verbally sequence pertinent routines for pt with modified independence in 3 sessions    Status Partially Met      SLP SHORT TERM GOAL #4   Title pt will participate functionally in 10 minutes mod complex conversation using compensations for anomia successfully    Baseline 10-15-20    Status Achieved      SLP SHORT TERM GOAL #5   Title pt will participate in further testing as necessary  (WAB-R or if pt desires, CLQT)    Period --   or 5 total sessions   Status Deferred            SLP Long Term Goals - 10/29/20 1220      SLP LONG TERM GOAL #1   Title pt will participate functionally in 20 minutes mod complex/complex conversation using compensations for anomia successfully in 3 sessions    Baseline 10-29-20    Time 4    Period Weeks   or 17 total sessions, for all LTGs   Status On-going      SLP LONG TERM GOAL #2   Title pt will express numbers with 100% success with self correction in 3 sessions    Time 4    Period Weeks    Status On-going      SLP LONG TERM GOAL #3   Title pt will listen to a 20 minute discourse (lecture, TED talk, etc) and functionally give a verbal synopsis with compensations (notes, anomia compensations, etc) in 3 sessions    Time 4    Period Weeks    Status On-going            Plan - 10/29/20 1219    Clinical Impression Statement Son presents today with reduced word finding ability in mod-complex lingiustic situation of providing verbal synopsis of a documentary. Pt also with reported attentional deficits described as inability to pay attention to long discourse now compared to pre-morbidly. SLP believes pt would cont to benefit from skilled ST to target expressive aphasia and if pt desires, cognitive communication/attention skills. He and wife deny swallowing difficulties during meals.    Speech Therapy Frequency 2x / week    Duration 8 weeks   17   Treatment/Interventions Language facilitation;Cueing hierarchy;SLP instruction and feedback;Compensatory strategies;Patient/family education;Internal/external aids;Cognitive reorganization;Multimodal communcation approach;Functional tasks    Potential to Achieve Goals Good           Patient will benefit from skilled therapeutic intervention in order to improve the following deficits and impairments:   Aphasia  Cognitive communication deficit   Speech Therapy Progress Note  Dates  of Reporting Period: 09-08-20 to present  Subjective Statement: Pt has been seen for 10 ST sessions focusing on cognitive lingusitic goals.  Objective: Pt's attention and expressive language have improved.   Goal Update: See above.  Plan: See above.  Reason Skilled Services are Required: Pt cont to have difficulty with expressive and receptive language.   Problem  List Patient Active Problem List   Diagnosis Date Noted  . Nonischemic cardiomyopathy ([place]) 10/06/2020  . Cryptogenic stroke ([place]) 10/06/2020  . Acute on chronic combined systolic and diastolic CHF (congestive heart failure) ([place]) 07/17/2020  . Renal cell carcinoma ([place]) 07/17/2020  . Protein-calorie malnutrition, severe 07/08/2020  . Acute respiratory failure with hypoxia ([place]) 07/05/2020  . Iron deficiency anemia due to chronic blood loss 06/19/2020  . OSA (obstructive sleep apnea) 01/09/2018  . Pulmonary nodule, left 03/15/2017  . Former smoker 11/04/2014  . Insomnia 05/27/2014  . Plantar wart of left foot 11/27/2010  . Type 2 diabetes mellitus with other specified complication ([place]) 09/62/8366  . PSA, INCREASED 09/15/2009  . Diverticulitis of colon 05/08/2009  . Asthma, moderate persistent 06/19/2008  . ANKLE PAIN, CHRONIC 06/05/2007  . Allergic rhinitis 05/17/2007  . ERECTILE DYSFUNCTION, SECONDARY TO MEDICATION 05/17/2007  . Hyperlipidemia 04/06/2007  . Anxiety state 04/06/2007  . Essential hypertension 04/06/2007  . Coronary artery disease involving native coronary artery of native heart without angina pectoris 04/06/2007    [facility] ,MS, CCC-SLP  10/29/2020, 12:21 PM  [facility] [address] [place], [place], [address] Phone: [phone]   Fax:  [phone]   Name: Larry Garner MRN: [id number] Date of Birth: 11-Sep-1955

## 2020-10-29 NOTE — Patient Instructions (Signed)
  Please complete the assigned speech therapy homework prior to your next session and return it to the speech therapist at your next visit.  

## 2020-11-03 ENCOUNTER — Ambulatory Visit: Payer: BC Managed Care – PPO

## 2020-11-03 ENCOUNTER — Ambulatory Visit: Payer: BC Managed Care – PPO | Admitting: Physical Therapy

## 2020-11-03 ENCOUNTER — Other Ambulatory Visit: Payer: Self-pay

## 2020-11-03 ENCOUNTER — Encounter: Payer: Self-pay | Admitting: Physical Therapy

## 2020-11-03 ENCOUNTER — Ambulatory Visit: Payer: BC Managed Care – PPO | Admitting: Occupational Therapy

## 2020-11-03 DIAGNOSIS — R2681 Unsteadiness on feet: Secondary | ICD-10-CM

## 2020-11-03 DIAGNOSIS — M6281 Muscle weakness (generalized): Secondary | ICD-10-CM | POA: Diagnosis not present

## 2020-11-03 DIAGNOSIS — R4701 Aphasia: Secondary | ICD-10-CM

## 2020-11-03 DIAGNOSIS — R41841 Cognitive communication deficit: Secondary | ICD-10-CM

## 2020-11-03 DIAGNOSIS — R2689 Other abnormalities of gait and mobility: Secondary | ICD-10-CM

## 2020-11-03 DIAGNOSIS — R278 Other lack of coordination: Secondary | ICD-10-CM

## 2020-11-03 NOTE — Therapy (Signed)
[facility] [address] [place], [place], [address] Phone: [phone]   Fax:  [phone]  Speech Language Pathology Treatment  Patient Details  Name: Larry Garner MRN: [id number] Date of Birth: [birth year] Referring Provider (SLP): Teresa Coombs, [place]   Encounter Date: 11/03/2020   End of Session - 11/03/20 1231    Visit Number 11    Number of Visits 17    Date for SLP Re-Evaluation 12/05/20    SLP Start Time 1103    SLP Stop Time  1148    SLP Time Calculation (min) 45 min    Activity Tolerance Patient tolerated treatment well           Past Medical History:  Diagnosis Date  . Allergic rhinitis   . Allergy   . Anxiety   . Asthma   . CHF (congestive heart failure) ([place])   . Diabetes ([place])   . Diverticulitis   . Dyspnea   . GERD (gastroesophageal reflux disease)    very occ  . History of heart attack   . HLD (hyperlipidemia)   . HTN (hypertension)   . Myocardial infarction ([place]) 2000  . Sleep apnea    wears cpap   . Tubular adenoma of colon 09/2008    Past Surgical History:  Procedure Laterality Date  . CARDIAC CATHETERIZATION N/A 07/20/2016   Procedure: Left Heart Cath and Cors/Grafts Angiography;  Surgeon: Belva Crome, MD;  Location: [facility];  Service: Cardiovascular;  Laterality: N/A;  . CATARACT EXTRACTION Bilateral 06/06/2019   Dr. Tommy Rainwater. oct left, dec right 2020   . COLONOSCOPY    . CORONARY ANGIOPLASTY    . CORONARY ARTERY BYPASS GRAFT  06/1999  . POLYPECTOMY    . RENAL BIOPSY  06/2020  . TONSILLECTOMY     late50's early 60's    There were no vitals filed for this visit.          ADULT SLP TREATMENT - 11/03/20 1116      General Information   Behavior/Cognition Alert;Cooperative;Pleasant mood      Treatment Provided   Treatment provided Cognitive-Linquistic      Cognitive-Linquistic Treatment   Treatment focused on Cognition;Aphasia    Skilled Treatment  (self care/home management(25 minutes): Long discussion today re: medication and pt's summary of interview of musicians. Pt said he was successful with supervision for filling med box. Last inght wife handed pt each bottle from the bag. SLP suggested next time give pt the bag of meds for time of day (separate bags for AM, both, and PM). Pt again told SLP he would like to recall medication name and function. SLP asked pt what would be a way he oculd do this. Pt thought of an idea but not most practical. SLP suggested pt repeat to himself the name and the function as he places the med in the med box. Pt was reminded he could also look at his chart that SLP had him make previously, and say the name/function for each med on his chart in order to expediate learning this. SLP educated wife cueing strategy for pt for obtainng his own refills. SLP and pt thought of a system that he will know what med needs to be refilled (leaving out the bottle and call <24 ohurs after completing the med box). (speech tx:) SLP asked pt for his summary of the interview of [place] S[employer] and [place].Pt had more anomia with description than with discussion about meds  and req'd extra time and rare min A from SLP for anomia.      Assessment / Recommendations / Plan   Plan Continue with current plan of care      Progression Toward Goals   Progression toward goals Progressing toward goals              SLP Short Term Goals - 10/27/20 1148      SLP SHORT TERM GOAL #1   Title pt will name 8 items in abstract categories in 5/6 opportunities in 3 sessions    Status Partially Met      SLP SHORT TERM GOAL #2   Title pt will describe to SLP 3 anomia compensations    Status Deferred      SLP SHORT TERM GOAL #3   Title pt will verbally sequence pertinent routines for pt with modified independence in 3 sessions    Status Partially Met      SLP SHORT TERM GOAL #4   Title pt will participate functionally in 10 minutes mod complex  conversation using compensations for anomia successfully    Baseline 10-15-20    Status Achieved      SLP SHORT TERM GOAL #5   Title pt will participate in further testing as necessary (WAB-R or if pt desires, CLQT)    Period --   or 5 total sessions   Status Deferred            SLP Long Term Goals - 11/03/20 1233      SLP LONG TERM GOAL #1   Title pt will participate functionally in 20 minutes mod complex/complex conversation using compensations for anomia successfully in 3 sessions    Baseline 10-29-20, 11-03-20    Time 3    Period Weeks   or 17 total sessions, for all LTGs   Status On-going      SLP LONG TERM GOAL #2   Title pt will express numbers with 100% success with self correction in 3 sessions    Baseline 11-03-20    Time 3    Period Weeks    Status On-going      SLP LONG TERM GOAL #3   Title pt will listen to a 20 minute discourse (lecture, TED talk, etc) and functionally give a verbal synopsis with compensations (notes, anomia compensations, etc) in 3 sessions    Baseline 11-03-20    Time 3    Period Weeks    Status On-going            Plan - 11/03/20 1232    Clinical Impression Statement Larry Garner presents today with cont'd reduced word finding ability in mod-complex lingiustic situation of providing verbal synopsis of a documentary.Pt is making slow progress with detail attention, problem solving, attention necessary for medication administration. SLP believes pt would cont to benefit from skilled ST to target expressive aphasia and if pt desires, cognitive communication/attention skills. He and wife deny swallowing difficulties during meals.    Speech Therapy Frequency 2x / week    Duration 8 weeks   17   Treatment/Interventions Language facilitation;Cueing hierarchy;SLP instruction and feedback;Compensatory strategies;Patient/family education;Internal/external aids;Cognitive reorganization;Multimodal communcation approach;Functional tasks    Potential to Achieve  Goals Good           Patient will benefit from skilled therapeutic intervention in order to improve the following deficits and impairments:   Aphasia  Cognitive communication deficit    Problem List Patient Active Problem List   Diagnosis Date Noted  .  Nonischemic cardiomyopathy ([place]) 10/06/2020  . Cryptogenic stroke ([place]) 10/06/2020  . Acute on chronic combined systolic and diastolic CHF (congestive heart failure) ([place]) 07/17/2020  . Renal cell carcinoma ([place]) 07/17/2020  . Protein-calorie malnutrition, severe 07/08/2020  . Acute respiratory failure with hypoxia ([place]) 07/05/2020  . Iron deficiency anemia due to chronic blood loss 06/19/2020  . OSA (obstructive sleep apnea) 01/09/2018  . Pulmonary nodule, left 03/15/2017  . Former smoker 11/04/2014  . Insomnia 05/27/2014  . Plantar wart of left foot 11/27/2010  . Type 2 diabetes mellitus with other specified complication ([place]) 25/63/8937  . PSA, INCREASED 09/15/2009  . Diverticulitis of colon 05/08/2009  . Asthma, moderate persistent 06/19/2008  . ANKLE PAIN, CHRONIC 06/05/2007  . Allergic rhinitis 05/17/2007  . ERECTILE DYSFUNCTION, SECONDARY TO MEDICATION 05/17/2007  . Hyperlipidemia 04/06/2007  . Anxiety state 04/06/2007  . Essential hypertension 04/06/2007  . Coronary artery disease involving native coronary artery of native heart without angina pectoris 04/06/2007    [place] ,MS, CCC-SLP  11/03/2020, 12:34 PM  [facility] [address] [place], [place], [address] Phone: [phone]   Fax:  [phone]   Name: Larry Garner MRN: [id number] Date of Birth: Nov 21, 1955

## 2020-11-03 NOTE — Therapy (Signed)
[facility] [address] [place], [place], [address] Phone: [phone]   Fax:  [phone]  Physical Therapy Treatment/10th Visit Progress Note  Patient Details  Name: Larry Garner MRN: [id number] Date of Birth: Nov 19, 1955 Referring Provider (PT): [place], Bea Laura, NP   10th Visit Physical Therapy Progress Note  Dates of Reporting Period: 10/01/20 to 11/03/20    Encounter Date: 11/03/2020   PT End of Session - 11/03/20 1536    Visit Number 20    Number of Visits 25    Date for PT Re-Evaluation 12/23/20    Authorization Type BCBS, Medicare    PT Start Time 1232    PT Stop Time 1315    PT Time Calculation (min) 43 min    Equipment Utilized During Treatment Gait belt    Activity Tolerance Patient tolerated treatment well    Behavior During Therapy [facility] for tasks assessed/performed           Past Medical History:  Diagnosis Date  . Allergic rhinitis   . Allergy   . Anxiety   . Asthma   . CHF (congestive heart failure) ([place])   . Diabetes ([place])   . Diverticulitis   . Dyspnea   . GERD (gastroesophageal reflux disease)    very occ  . History of heart attack   . HLD (hyperlipidemia)   . HTN (hypertension)   . Myocardial infarction ([place]) 2000  . Sleep apnea    wears cpap   . Tubular adenoma of colon 09/2008    Past Surgical History:  Procedure Laterality Date  . CARDIAC CATHETERIZATION N/A 07/20/2016   Procedure: Left Heart Cath and Cors/Grafts Angiography;  Surgeon: Belva Crome, MD;  Location: [facility];  Service: Cardiovascular;  Laterality: N/A;  . CATARACT EXTRACTION Bilateral 06/06/2019   Dr. Tommy Rainwater. oct left, dec right 2020   . COLONOSCOPY    . CORONARY ANGIOPLASTY    . CORONARY ARTERY BYPASS GRAFT  06/1999  . POLYPECTOMY    . RENAL BIOPSY  06/2020  . TONSILLECTOMY     late50's early 60's    There were no vitals filed for this visit.   Subjective Assessment - 11/03/20 1235     Subjective Had a fall last night. Was turning and caught his hip on the corner on the bed - did not completely hit the ground and just got on his knees. Did not hit his head. Was able to get up ok. Was not using his cane. Reports he is moving fine today.    Patient is accompained by: Family member   beth   Pertinent History L MCA CVA, Coronary Artery Disease, Heart Failure, Hyperlipidemia, Osteoarthritis, GERD, Renal Cell Cancer requiring nephrectomy (active)    Limitations Standing;Walking;Other (comment)   stairs   How Larry can you walk comfortably? approx. 100' with RW    Patient Stated Goals go up and down the stairs by himself, find out what his new normal will be, work on strength and flexbility.    Currently in Pain? No/denies                             [facility] Adult PT Treatment/Exercise - 11/03/20 1307      Transfers   Transfers Sit to Stand;Stand to Sit    Sit to Stand 4: Min guard;5: Supervision    Stand to Sit 5: Supervision    Comments x7 reps from  mat table using BUE to come to stand, slow eccentric control without using UE suppot      Ambulation/Gait   Ambulation/Gait Yes    Ambulation/Gait Assistance 4: Min guard;5: Supervision    Ambulation/Gait Assistance Details between activities in session, pt with one episode of min A for balance when rounding a curve when more fatigued.    Ambulation Distance (Feet) 115 Feet    Assistive device Straight cane   with 4 prong tip   Gait Pattern Step-through pattern    Ambulation Surface Level;Indoor               Balance Exercises - 11/03/20 1307      Balance Exercises: Standing   Standing Eyes Opened Narrow base of support (BOS);Foam/compliant surface;Limitations;Head turns    Standing Eyes Opened Limitations on blue air ex, feet together 2 x 10 reps head turns, 2 x 10 reps head nods; intermittent tap to walls for balance    Standing Eyes Closed Narrow base of support (BOS);Foam/compliant surface     Standing Eyes Closed Limitations blue air ex: 3 x 30 seconds with space between feet    SLS with Vectors Upper extremity assist 2;Foam/compliant surface    SLS with Vectors Limitations alternating SLS taps to 6" step with B fingertip support x12 reps B    Step Ups Forward;6 inch;UE support 2;Limitations    Step Ups Limitations performed with BLE, with floating non stance leg, incr difficulty with RLE    Step Over Hurdles / Cones stepping over 5 orange hurdles next to countertop (1 larger and 4 smaller) down and back x5 reps, beginning with step to and progressing to step over step    Other Standing Exercises on blue air ex: lateral wide BOS weight shifting x12 reps B, beginning with UE support and then none               PT Short Term Goals - 10/24/20 1616      PT SHORT TERM GOAL #1   Title ALL STGS = LTGS             PT Larry Term Goals - 10/24/20 1618      PT Larry TERM GOAL #1   Title Pt will be independent with final HEP in order to build upon functional gains made in therapy. ALL LTGS DUE 11/21/20    Baseline pt reports performing exercises more consistently at home - will benefit from ongoing additions.    Time 4    Period Weeks    Status On-going    Target Date 11/21/20      PT Larry TERM GOAL #2   Title Pt will improve DGI to at least 16/24 in order to demo decr fall risk.    Baseline 14/24 on DGI on 10/24/20    Time 4    Period Weeks    Status Revised      PT Larry TERM GOAL #3   Title Pt will improve gait speed with LRAD to at least 2.2 ft/sec in order to demo improved community mobility.    Baseline 1.89 seconds with SPC with quad tip (improved from 1.60 ft/sec with LRAD)    Time 4    Period Weeks    Status Revised      PT Larry TERM GOAL #4   Title Pt will perform at least 10 sit <> stands during 30 escond chair stand with BUE support in order to demo improved BLE strength.    Baseline  30 second chair stand: 8 sit <> stands with BUE support from standard mat  table    Time 4    Period Weeks    Status New      PT Larry TERM GOAL #5   Title Pt will ambulate at least 500' with supervision over indoor/outdoor surfaces with LRAD and perform a curb in order to demo improved community mobility.    Baseline with supervision/min guard with spc with 4 prong tip.    Time 4    Period Weeks    Status Revised               Plan - 11/03/20 1542    Clinical Impression Statement 10th visit progress note: LTGs assessed on 10/22/20 and 10/24/20 - Pt improved TUG time to 12.62 seconds with SPC (previously was 20.63 seconds). Pt's gait speed with cane incr to 1.89 ft/sec, improved from 1.60 ft/sec, but not quite to goal level. Pt scored a 50/56 on the BERG -pt still unable to perform sit <> stands without UE support, unable to perform tandem stance without UE support, needs assist for SLS, and still slowed turns in R/L direction. Performed the DGI and pt scored a 14/24, indicating an incr risk of falls. Today's skilled session focused on BLE strengthening and standing balance strategies on compliant surfaces and SLS. Pt still with continued difficulty with head motions with feet together on a compliant surface. Towards end of session, pt did need one episode of min A for balance when going around a curb due to pt reporting incr fatigue. Will continue to progress towards LTGs.    Personal Factors and Comorbidities Comorbidity 3+;Past/Current Experience    Comorbidities L MCA CVA, Coronary Artery Disease, Heart Failure, Hyperlipidemia, Osteoarthritis, GERD, Renal Cell Cancer requiring nephrectomy (active)    Examination-Activity Limitations Locomotion Level;Stand;Transfers;Stairs    Examination-Participation Restrictions Community Activity;Occupation   work (is a Customer service manager)   [employer] Stable/Uncomplicated    Rehab Potential Good    PT Frequency 2x / week    PT Duration 4 weeks    PT Treatment/Interventions ADLs/Self Care Home Management;Stair  training;Gait training;DME Instruction;Functional mobility training;Therapeutic activities;Therapeutic exercise;Balance training;Neuromuscular re-education;Patient/family education;Orthotic Fit/Training;Passive range of motion;Vestibular;Energy conservation    PT Next Visit Plan SLS, and with decr UE support. balance with head motions, esp nods. RLE strength and steping tasks. Scifit for aerobic warm up    PT Home Exercise Plan added eccentric sitting to HEP, 5 reps per set    Consulted and Agree with Plan of Care Patient;Family member/caregiver    Family Member Consulted wife, [name]           Patient will benefit from skilled therapeutic intervention in order to improve the following deficits and impairments:  Abnormal gait,Decreased activity tolerance,Decreased balance,Decreased endurance,Decreased knowledge of use of DME,Decreased range of motion,Decreased strength,Difficulty walking,Decreased safety awareness  Visit Diagnosis: Unsteadiness on feet  Muscle weakness (generalized)  Other lack of coordination  Other abnormalities of gait and mobility     Problem List Patient Active Problem List   Diagnosis Date Noted  . Nonischemic cardiomyopathy ([place]) 10/06/2020  . Cryptogenic stroke ([place]) 10/06/2020  . Acute on chronic combined systolic and diastolic CHF (congestive heart failure) ([place]) 07/17/2020  . Renal cell carcinoma ([place]) 07/17/2020  . Protein-calorie malnutrition, severe 07/08/2020  . Acute respiratory failure with hypoxia ([place]) 07/05/2020  . Iron deficiency anemia due to chronic blood loss 06/19/2020  . OSA (obstructive sleep apnea) 01/09/2018  . Pulmonary nodule, left 03/15/2017  .  Former smoker 11/04/2014  . Insomnia 05/27/2014  . Plantar wart of left foot 11/27/2010  . Type 2 diabetes mellitus with other specified complication ([place]) 23/53/6144  . PSA, INCREASED 09/15/2009  . Diverticulitis of colon 05/08/2009  . Asthma, moderate persistent 06/19/2008  . ANKLE  PAIN, CHRONIC 06/05/2007  . Allergic rhinitis 05/17/2007  . ERECTILE DYSFUNCTION, SECONDARY TO MEDICATION 05/17/2007  . Hyperlipidemia 04/06/2007  . Anxiety state 04/06/2007  . Essential hypertension 04/06/2007  . Coronary artery disease involving native coronary artery of native heart without angina pectoris 04/06/2007    Arliss Journey, PT, DPT  11/03/2020, 3:47 PM  [facility] [address] [place], [place], [address] Phone: [phone]   Fax:  [phone]  Name: Larry Garner MRN: [id number] Date of Birth: [birth year]

## 2020-11-05 ENCOUNTER — Ambulatory Visit: Payer: BC Managed Care – PPO

## 2020-11-05 ENCOUNTER — Encounter: Payer: Self-pay | Admitting: Physical Therapy

## 2020-11-05 ENCOUNTER — Other Ambulatory Visit: Payer: Self-pay

## 2020-11-05 ENCOUNTER — Encounter: Payer: Self-pay | Admitting: Family Medicine

## 2020-11-05 ENCOUNTER — Ambulatory Visit: Payer: BC Managed Care – PPO | Admitting: Occupational Therapy

## 2020-11-05 ENCOUNTER — Ambulatory Visit: Payer: BC Managed Care – PPO | Admitting: Physical Therapy

## 2020-11-05 DIAGNOSIS — M6281 Muscle weakness (generalized): Secondary | ICD-10-CM | POA: Diagnosis not present

## 2020-11-05 DIAGNOSIS — R41841 Cognitive communication deficit: Secondary | ICD-10-CM

## 2020-11-05 DIAGNOSIS — R278 Other lack of coordination: Secondary | ICD-10-CM

## 2020-11-05 DIAGNOSIS — R4701 Aphasia: Secondary | ICD-10-CM

## 2020-11-05 DIAGNOSIS — R2681 Unsteadiness on feet: Secondary | ICD-10-CM

## 2020-11-05 DIAGNOSIS — R2689 Other abnormalities of gait and mobility: Secondary | ICD-10-CM

## 2020-11-05 NOTE — Patient Instructions (Signed)
   To help recall the name and the function of your medication, when you put each medication into it's compartment, SAY the NAME and the FUNCTION of the medication.   Example: "This is gabapentin - for my itching skin." or "Gabapentin - it takes away the itching on my skin"

## 2020-11-05 NOTE — Therapy (Signed)
[facility] [address] [place], [place], [address] Phone: [phone]   Fax:  [phone]  Speech Language Pathology Treatment  Patient Details  Name: Larry Garner MRN: [id number] Date of Birth: 1956-07-02 Referring Provider (SLP): Larry Garner, [place]   Encounter Date: 11/05/2020   End of Session - 11/05/20 1148    Visit Number 12    Number of Visits 17    Date for SLP Re-Evaluation 12/05/20    SLP Start Time 1103    SLP Stop Time  1145    SLP Time Calculation (min) 42 min    Activity Tolerance Patient tolerated treatment well           Past Medical History:  Diagnosis Date  . Allergic rhinitis   . Allergy   . Anxiety   . Asthma   . CHF (congestive heart failure) ([place])   . Diabetes ([place])   . Diverticulitis   . Dyspnea   . GERD (gastroesophageal reflux disease)    very occ  . History of heart attack   . HLD (hyperlipidemia)   . HTN (hypertension)   . Myocardial infarction ([place]) 2000  . Sleep apnea    wears cpap   . Tubular adenoma of colon 09/2008    Past Surgical History:  Procedure Laterality Date  . CARDIAC CATHETERIZATION N/A 07/20/2016   Procedure: Left Heart Cath and Cors/Grafts Angiography;  Surgeon: Larry Crome, MD;  Location: [facility];  Service: Cardiovascular;  Laterality: N/A;  . CATARACT EXTRACTION Bilateral 06/06/2019   Dr. Tommy Garner. oct left, dec right 2020   . COLONOSCOPY    . CORONARY ANGIOPLASTY    . CORONARY ARTERY BYPASS GRAFT  06/1999  . POLYPECTOMY    . RENAL BIOPSY  06/2020  . TONSILLECTOMY     late50's early 60's    There were no vitals filed for this visit.   Subjective Assessment - 11/05/20 1108    Subjective Pt verbally provided SLP his homework in PT    Patient is accompained by: Family member   Larry Garner                ADULT SLP TREATMENT - 11/05/20 1110      General Information   Behavior/Cognition Alert;Cooperative;Pleasant mood       Treatment Provided   Treatment provided Cognitive-Linquistic      Cognitive-Linquistic Treatment   Treatment focused on Aphasia;Cognition    Skilled Treatment SLP asked for a summary of the discussion last session about medication. SLP had pt state what SLP suggested last session and SLP ?s pt's ability to provide correct answer verbally - he req'd total A for this. Pt gave his verbal summary of the last part of the documentary interview, which he used his difficult-to-read notes to tell SLP highlights. SLP wanted pt to tell SLP about each of his medications - pt told SLP categories of his meds with extra time due to sentence construction difficulties - pt used written notes and stated names and functions of 5 with occasional min A.      Assessment / Recommendations / Plan   Plan Continue with current plan of care      Progression Toward Goals   Progression toward goals Progressing toward goals            SLP Education - 11/05/20 1148    Education Details medications and functions    Person(s) Educated Patient;Spouse    Methods Explanation  Comprehension Verbalized understanding;Need further instruction            SLP Short Term Goals - 10/27/20 1148      SLP SHORT TERM GOAL #1   Title pt will name 8 items in abstract categories in 5/6 opportunities in 3 sessions    Status Partially Met      SLP [place] #2   Title pt will describe to SLP 3 anomia compensations    Status Deferred      SLP SHORT TERM GOAL #3   Title pt will verbally sequence pertinent routines for pt with modified independence in 3 sessions    Status Partially Met      SLP SHORT TERM GOAL #4   Title pt will participate functionally in 10 minutes mod complex conversation using compensations for anomia successfully    Baseline 10-15-20    Status Achieved      SLP SHORT TERM GOAL #5   Title pt will participate in further testing as necessary (WAB-R or if pt desires, CLQT)    Period --   or 5  total sessions   Status Deferred            SLP Long Term Goals - 11/05/20 1149      SLP LONG TERM GOAL #1   Title pt will participate functionally in 20 minutes mod complex/complex conversation using compensations for anomia successfully in 3 sessions    Baseline 10-29-20, 11-03-20    Period --   or 17 total sessions, for all LTGs   Status Achieved      SLP LONG TERM GOAL #2   Title pt will express numbers with 100% success with self correction in 3 sessions    Baseline 11-03-20, 30-30-22    Time 3    Period Weeks    Status On-going      SLP LONG TERM GOAL #3   Title pt will listen to a 20 minute discourse (lecture, TED talk, etc) and functionally give a verbal synopsis with compensations (notes, anomia compensations, etc) in 3 sessions    Baseline 11-03-20    Time 3    Period Weeks    Status On-going            Plan - 11/05/20 1149    Clinical Impression Statement Larry Garner presents today with cont'd reduced word finding ability in mod-complex lingiustic situation of providing verbal synopsis of a documentary.Pt is making slow progress with detail attention, problem solving, attention necessary for medication administration. SLP believes pt would cont to benefit from skilled ST to target expressive aphasia and if pt desires, cognitive communication/attention skills. He and Garner deny swallowing difficulties during meals.    Speech Therapy Frequency 2x / week    Duration 8 weeks   17   Treatment/Interventions Language facilitation;Cueing hierarchy;SLP instruction and feedback;Compensatory strategies;Patient/family education;Internal/external aids;Cognitive reorganization;Multimodal communcation approach;Functional tasks    Potential to Achieve Goals Good           Patient will benefit from skilled therapeutic intervention in order to improve the following deficits and impairments:   Aphasia  Cognitive communication deficit    Problem List Patient Active Problem List    Diagnosis Date Noted  . Nonischemic cardiomyopathy ([place]) 10/06/2020  . Cryptogenic stroke ([place]) 10/06/2020  . Acute on chronic combined systolic and diastolic CHF (congestive heart failure) ([place]) 07/17/2020  . Renal cell carcinoma ([place]) 07/17/2020  . Protein-calorie malnutrition, severe 07/08/2020  . Acute respiratory failure with hypoxia ([place]) 07/05/2020  .  Iron deficiency anemia due to chronic blood loss 06/19/2020  . OSA (obstructive sleep apnea) 01/09/2018  . Pulmonary nodule, left 03/15/2017  . Former smoker 11/04/2014  . Insomnia 05/27/2014  . Plantar wart of left foot 11/27/2010  . Type 2 diabetes mellitus with other specified complication ([place]) 15/83/0940  . PSA, INCREASED 09/15/2009  . Diverticulitis of colon 05/08/2009  . Asthma, moderate persistent 06/19/2008  . ANKLE PAIN, CHRONIC 06/05/2007  . Allergic rhinitis 05/17/2007  . ERECTILE DYSFUNCTION, SECONDARY TO MEDICATION 05/17/2007  . Hyperlipidemia 04/06/2007  . Anxiety state 04/06/2007  . Essential hypertension 04/06/2007  . Coronary artery disease involving native coronary artery of native heart without angina pectoris 04/06/2007    [facility] ,MS, CCC-SLP  11/05/2020, 11:50 AM  [facility] [address] [place], [place], [address] Phone: [phone]   Fax:  [phone]   Name: Larry Garner MRN: [id number] Date of Birth: 03-Feb-1956

## 2020-11-05 NOTE — Therapy (Signed)
[facility] [address] [place], [place], [address] Phone: [phone]   Fax:  [phone]  Physical Therapy Treatment  Patient Details  Name: Larry Garner MRN: [id number] Date of Birth: 1956/02/11 Referring Provider (PT): [place], Bea Laura, NP   Encounter Date: 11/05/2020   PT End of Session - 11/05/20 1230    Visit Number 21    Number of Visits 25    Date for PT Re-Evaluation 12/23/20    Authorization Type BCBS, Medicare    PT Start Time 1148    PT Stop Time 1229    PT Time Calculation (min) 41 min    Equipment Utilized During Treatment Gait belt    Activity Tolerance Patient tolerated treatment well    Behavior During Therapy [facility] for tasks assessed/performed           Past Medical History:  Diagnosis Date  . Allergic rhinitis   . Allergy   . Anxiety   . Asthma   . CHF (congestive heart failure) ([place])   . Diabetes ([place])   . Diverticulitis   . Dyspnea   . GERD (gastroesophageal reflux disease)    very occ  . History of heart attack   . HLD (hyperlipidemia)   . HTN (hypertension)   . Myocardial infarction ([place]) 2000  . Sleep apnea    wears cpap   . Tubular adenoma of colon 09/2008    Past Surgical History:  Procedure Laterality Date  . CARDIAC CATHETERIZATION N/A 07/20/2016   Procedure: Left Heart Cath and Cors/Grafts Angiography;  Surgeon: Belva Crome, MD;  Location: [facility];  Service: Cardiovascular;  Laterality: N/A;  . CATARACT EXTRACTION Bilateral 06/06/2019   Dr. Tommy Rainwater. oct left, dec right 2020   . COLONOSCOPY    . CORONARY ANGIOPLASTY    . CORONARY ARTERY BYPASS GRAFT  06/1999  . POLYPECTOMY    . RENAL BIOPSY  06/2020  . TONSILLECTOMY     late50's early 60's    There were no vitals filed for this visit.   Subjective Assessment - 11/05/20 1150    Subjective Doing good. No new falls. Feels like L leg is starting to get stronger.    Patient is accompained by: Family  member   beth   Pertinent History L MCA CVA, Coronary Artery Disease, Heart Failure, Hyperlipidemia, Osteoarthritis, GERD, Renal Cell Cancer requiring nephrectomy (active)    Limitations Standing;Walking;Other (comment)   stairs   How long can you walk comfortably? approx. 100' with RW    Patient Stated Goals go up and down the stairs by himself, find out what his new normal will be, work on strength and flexbility.    Currently in Pain? No/denies                             [facility] Adult PT Treatment/Exercise - 11/05/20 1204      Knee/Hip Exercises: Standing   Lateral Step Up Both;1 set;10 reps;Step Height: 6";Hand Hold: 2    Lateral Step Up Limitations standing at staircase, R side broken up into 2 sets of 5 reps    Forward Step Up Both;1 set;10 reps;Hand Hold: 2;Step Height: 6"    Forward Step Up Limitations standing at staircase    Other Standing Knee Exercises x10 reps mini squats with UE support, holding mini squat stepping RLE out and in x5 reps, then performed same with stepping LLE out and in -  needing initial cues for proper technique               Balance Exercises - 11/05/20 0001      Balance Exercises: Standing   Stepping Strategy Anterior;Posterior;Limitations    Stepping Strategy Limitations x5 reps in each direction, alternating steps on level ground, no UE support, initial cues for step length    Rockerboard Lateral;EO    Rockerboard Limitations weight shifting R/L with no UE support x12 reps, then trying to keep board still x10 reps head nods and x10 reps with head turns with min guard/min A and intermittent taps to bars for balance    Sidestepping Foam/compliant support;3 reps    Sidestepping Limitations on blue foam beam, intermittent UE support, cues for foot clearance    Marching Solid surface;Forwards;Retro;Upper extremity assist 2    Marching Limitations 3 reps down and back in // bars with 3# ankle weights               PT Short  Term Goals - 10/24/20 1616      PT SHORT TERM GOAL #1   Title ALL STGS = LTGS             PT Long Term Goals - 10/24/20 1618      PT LONG TERM GOAL #1   Title Pt will be independent with final HEP in order to build upon functional gains made in therapy. ALL LTGS DUE 11/21/20    Baseline pt reports performing exercises more consistently at home - will benefit from ongoing additions.    Time 4    Period Weeks    Status On-going    Target Date 11/21/20      PT LONG TERM GOAL #2   Title Pt will improve DGI to at least 16/24 in order to demo decr fall risk.    Baseline 14/24 on DGI on 10/24/20    Time 4    Period Weeks    Status Revised      PT LONG TERM GOAL #3   Title Pt will improve gait speed with LRAD to at least 2.2 ft/sec in order to demo improved community mobility.    Baseline 1.89 seconds with SPC with quad tip (improved from 1.60 ft/sec with LRAD)    Time 4    Period Weeks    Status Revised      PT LONG TERM GOAL #4   Title Pt will perform at least 10 sit <> stands during 30 escond chair stand with BUE support in order to demo improved BLE strength.    Baseline 30 second chair stand: 8 sit <> stands with BUE support from standard mat table    Time 4    Period Weeks    Status New      PT LONG TERM GOAL #5   Title Pt will ambulate at least 500' with supervision over indoor/outdoor surfaces with LRAD and perform a curb in order to demo improved community mobility.    Baseline with supervision/min guard with spc with 4 prong tip.    Time 4    Period Weeks    Status Revised                 Plan - 11/05/20 1231    Clinical Impression Statement Today's skilled session focused on BLE strengthening and standing balance strategies working on decr UE support. Pt tolerated session well, had incr difficulty with step ups when leading with RLE. Will continue to progress  towards LTGs.    Personal Factors and Comorbidities Comorbidity 3+;Past/Current Experience     Comorbidities L MCA CVA, Coronary Artery Disease, Heart Failure, Hyperlipidemia, Osteoarthritis, GERD, Renal Cell Cancer requiring nephrectomy (active)    Examination-Activity Limitations Locomotion Level;Stand;Transfers;Stairs    Examination-Participation Restrictions Community Activity;Occupation   work (is a Customer service manager)   [employer] Stable/Uncomplicated    Rehab Potential Good    PT Frequency 2x / week    PT Duration 4 weeks    PT Treatment/Interventions ADLs/Self Care Home Management;Stair training;Gait training;DME Instruction;Functional mobility training;Therapeutic activities;Therapeutic exercise;Balance training;Neuromuscular re-education;Patient/family education;Orthotic Fit/Training;Passive range of motion;Vestibular;Energy conservation    PT Next Visit Plan SLS, and with decr UE support. balance with head motions, esp nods. RLE strength and stepping tasks. Scifit for aerobic warm up    PT Home Exercise Plan added eccentric sitting to HEP, 5 reps per set    Consulted and Agree with Plan of Care Patient;Family member/caregiver    Family Member Consulted wife, [name]           Patient will benefit from skilled therapeutic intervention in order to improve the following deficits and impairments:  Abnormal gait,Decreased activity tolerance,Decreased balance,Decreased endurance,Decreased knowledge of use of DME,Decreased range of motion,Decreased strength,Difficulty walking,Decreased safety awareness  Visit Diagnosis: Unsteadiness on feet  Muscle weakness (generalized)  Other lack of coordination  Other abnormalities of gait and mobility     Problem List Patient Active Problem List   Diagnosis Date Noted  . Nonischemic cardiomyopathy ([place]) 10/06/2020  . Cryptogenic stroke ([place]) 10/06/2020  . Acute on chronic combined systolic and diastolic CHF (congestive heart failure) ([place]) 07/17/2020  . Renal cell carcinoma ([place]) 07/17/2020  . Protein-calorie  malnutrition, severe 07/08/2020  . Acute respiratory failure with hypoxia ([place]) 07/05/2020  . Iron deficiency anemia due to chronic blood loss 06/19/2020  . OSA (obstructive sleep apnea) 01/09/2018  . Pulmonary nodule, left 03/15/2017  . Former smoker 11/04/2014  . Insomnia 05/27/2014  . Plantar wart of left foot 11/27/2010  . Type 2 diabetes mellitus with other specified complication ([place]) 48/54/6270  . PSA, INCREASED 09/15/2009  . Diverticulitis of colon 05/08/2009  . Asthma, moderate persistent 06/19/2008  . ANKLE PAIN, CHRONIC 06/05/2007  . Allergic rhinitis 05/17/2007  . ERECTILE DYSFUNCTION, SECONDARY TO MEDICATION 05/17/2007  . Hyperlipidemia 04/06/2007  . Anxiety state 04/06/2007  . Essential hypertension 04/06/2007  . Coronary artery disease involving native coronary artery of native heart without angina pectoris 04/06/2007    Arliss Journey, PT, DPT  11/05/2020, 12:33 PM  [facility] [address] [place], [place], [address] Phone: [phone]   Fax:  [phone]  Name: Larry Garner MRN: [id number] Date of Birth: [birth year]

## 2020-11-07 ENCOUNTER — Other Ambulatory Visit: Payer: Self-pay

## 2020-11-07 MED ORDER — OZEMPIC (0.25 OR 0.5 MG/DOSE) 2 MG/1.5ML ~~LOC~~ SOPN: PEN_INJECTOR | SUBCUTANEOUS | 3 refills | Status: DC

## 2020-11-07 NOTE — Telephone Encounter (Signed)
Called and spoke with pt wife and went over Ozepmic instructions with her. She will have pt send in readings in 2 weeks for Dr. Yong Channel to review.

## 2020-11-10 ENCOUNTER — Encounter: Payer: Self-pay | Admitting: Physical Therapy

## 2020-11-10 ENCOUNTER — Ambulatory Visit: Payer: BC Managed Care – PPO | Attending: Family Medicine | Admitting: Physical Therapy

## 2020-11-10 ENCOUNTER — Ambulatory Visit: Payer: BC Managed Care – PPO

## 2020-11-10 ENCOUNTER — Other Ambulatory Visit: Payer: Self-pay

## 2020-11-10 DIAGNOSIS — R4701 Aphasia: Secondary | ICD-10-CM | POA: Insufficient documentation

## 2020-11-10 DIAGNOSIS — I69854 Hemiplegia and hemiparesis following other cerebrovascular disease affecting left non-dominant side: Secondary | ICD-10-CM | POA: Diagnosis present

## 2020-11-10 DIAGNOSIS — R2689 Other abnormalities of gait and mobility: Secondary | ICD-10-CM | POA: Diagnosis present

## 2020-11-10 DIAGNOSIS — R278 Other lack of coordination: Secondary | ICD-10-CM

## 2020-11-10 DIAGNOSIS — R41841 Cognitive communication deficit: Secondary | ICD-10-CM

## 2020-11-10 DIAGNOSIS — R2681 Unsteadiness on feet: Secondary | ICD-10-CM

## 2020-11-10 DIAGNOSIS — M6281 Muscle weakness (generalized): Secondary | ICD-10-CM | POA: Diagnosis present

## 2020-11-10 NOTE — Therapy (Signed)
[facility] [address] [place], [place], [address] Phone: [phone]   Fax:  [phone]  Speech Language Pathology Treatment  Patient Details  Name: Larry Garner MRN: [id number] Date of Birth: 10-02-1955 Referring Provider (SLP): Teresa Coombs, [place]   Encounter Date: 11/10/2020   End of Session - 11/10/20 1248    Visit Number 13    Number of Visits 17    Date for SLP Re-Evaluation 12/05/20    SLP Start Time 0934    SLP Stop Time  1015    SLP Time Calculation (min) 41 min    Activity Tolerance Patient tolerated treatment well           Past Medical History:  Diagnosis Date  . Allergic rhinitis   . Allergy   . Anxiety   . Asthma   . CHF (congestive heart failure) ([place])   . Diabetes ([place])   . Diverticulitis   . Dyspnea   . GERD (gastroesophageal reflux disease)    very occ  . History of heart attack   . HLD (hyperlipidemia)   . HTN (hypertension)   . Myocardial infarction ([place]) 2000  . Sleep apnea    wears cpap   . Tubular adenoma of colon 09/2008    Past Surgical History:  Procedure Laterality Date  . CARDIAC CATHETERIZATION N/A 07/20/2016   Procedure: Left Heart Cath and Cors/Grafts Angiography;  Surgeon: Belva Crome, MD;  Location: [facility];  Service: Cardiovascular;  Laterality: N/A;  . CATARACT EXTRACTION Bilateral 06/06/2019   Dr. Tommy Rainwater. oct left, dec right 2020   . COLONOSCOPY    . CORONARY ANGIOPLASTY    . CORONARY ARTERY BYPASS GRAFT  06/1999  . POLYPECTOMY    . RENAL BIOPSY  06/2020  . TONSILLECTOMY     late50's early 60's    There were no vitals filed for this visit.   Subjective Assessment - 11/10/20 0941    Subjective "The appointmetn with the kidney surgeon is later today."    Patient is accompained by: Family member   [name] -wife   Currently in Pain? No/denies                 ADULT SLP TREATMENT - 11/10/20 0943      General Information    Behavior/Cognition Alert;Cooperative;Pleasant mood      Treatment Provided   Treatment provided Cognitive-Linquistic      Cognitive-Linquistic Treatment   Treatment focused on Aphasia;Cognition    Skilled Treatment "(the aphasia) is not as frequent as it was, or as bothersome as it was." Pt and wife agree anomia is more of a daily thing than a weekly thing -once a day. In a complex conversation about medications pt demonstrated decr'd sentence construction and pauses were functional 85% of the time. SLP educated pt and wife on Agricultural engineer and went through an example with pt/wife. Pt req'd min A for generating plausible sentnces with transitive verbs, and min-mod A expanding a sentence. SLP noted pt with reduced alternating attention x1 during this task, unaware of error until SLP pointed this out to him.      Assessment / Recommendations / Plan   Plan Continue with current plan of care      Progression Toward Goals   Progression toward goals Progressing toward goals            SLP Education - 11/10/20 1248    Education Details VNeST procedure  Person(s) Educated Patient;Spouse    Methods Explanation;Demonstration;Verbal cues    Comprehension Verbalized understanding;Returned demonstration;Verbal cues required;Need further instruction            SLP Short Term Goals - 10/27/20 1148      SLP [place] #1   Title pt will name 8 items in abstract categories in 5/6 opportunities in 3 sessions    Status Partially Met      SLP [place] #2   Title pt will describe to SLP 3 anomia compensations    Status Deferred      SLP SHORT TERM GOAL #3   Title pt will verbally sequence pertinent routines for pt with modified independence in 3 sessions    Status Partially Met      SLP SHORT TERM GOAL #4   Title pt will participate functionally in 10 minutes mod complex conversation using compensations for anomia successfully    Baseline 10-15-20    Status  Achieved      SLP SHORT TERM GOAL #5   Title pt will participate in further testing as necessary (WAB-R or if pt desires, CLQT)    Period --   or 5 total sessions   Status Deferred            SLP Long Term Goals - 11/10/20 1249      SLP LONG TERM GOAL #1   Title pt will participate functionally in 20 minutes mod complex/complex conversation using compensations for anomia successfully in 3 sessions    Baseline 10-29-20, 11-03-20    Period --   or 17 total sessions, for all LTGs   Status Achieved      SLP LONG TERM GOAL #2   Title pt will express numbers with 100% success with self correction in 3 sessions    Baseline 11-03-20, 30-30-22    Time 2    Period Weeks    Status On-going      SLP LONG TERM GOAL #3   Title pt will listen to a 20 minute discourse (lecture, TED talk, etc) and functionally give a verbal synopsis with compensations (notes, anomia compensations, etc) in 3 sessions    Baseline 11-03-20    Time 2    Period Weeks    Status On-going            Plan - 11/10/20 [place] presents today with cont'd word finding ability in mod-complex conversation..Pt is making slow progress with detail attention, problem solving, attention necessary for medication administration. SLP believes pt would cont to benefit from skilled ST to target expressive aphasia and if pt desires, cognitive communication/attention skills. He and wife deny swallowing difficulties during meals.    Speech Therapy Frequency 2x / week    Duration 8 weeks   17   Treatment/Interventions Language facilitation;Cueing hierarchy;SLP instruction and feedback;Compensatory strategies;Patient/family education;Internal/external aids;Cognitive reorganization;Multimodal communcation approach;Functional tasks    Potential to Achieve Goals Good           Patient will benefit from skilled therapeutic intervention in order to improve the following deficits and impairments:    Aphasia  Cognitive communication deficit    Problem List Patient Active Problem List   Diagnosis Date Noted  . Nonischemic cardiomyopathy ([place]) 10/06/2020  . Cryptogenic stroke ([place]) 10/06/2020  . Acute on chronic combined systolic and diastolic CHF (congestive heart failure) ([place]) 07/17/2020  . Renal cell carcinoma ([place]) 07/17/2020  . Protein-calorie malnutrition, severe 07/08/2020  . Acute respiratory  failure with hypoxia ([place]) 07/05/2020  . Iron deficiency anemia due to chronic blood loss 06/19/2020  . OSA (obstructive sleep apnea) 01/09/2018  . Pulmonary nodule, left 03/15/2017  . Former smoker 11/04/2014  . Insomnia 05/27/2014  . Plantar wart of left foot 11/27/2010  . Type 2 diabetes mellitus with other specified complication ([place]) 28/20/8138  . PSA, INCREASED 09/15/2009  . Diverticulitis of colon 05/08/2009  . Asthma, moderate persistent 06/19/2008  . ANKLE PAIN, CHRONIC 06/05/2007  . Allergic rhinitis 05/17/2007  . ERECTILE DYSFUNCTION, SECONDARY TO MEDICATION 05/17/2007  . Hyperlipidemia 04/06/2007  . Anxiety state 04/06/2007  . Essential hypertension 04/06/2007  . Coronary artery disease involving native coronary artery of native heart without angina pectoris 04/06/2007    [facility] ,MS, CCC-SLP  11/10/2020, 12:51 PM  [facility] [address] [place], [place], [address] Phone: [phone]   Fax:  [phone]   Name: Larry Garner MRN: [id number] Date of Birth: October 15, 1955

## 2020-11-10 NOTE — Therapy (Addendum)
[facility] [address] [place], [place], [address] Phone: [phone]   Fax:  [phone]  Physical Therapy Treatment  Patient Details  Name: Larry Garner MRN: [id number] Date of Birth: 23-Jun-1956 Referring Provider (PT): Redmond, Bea Laura, NP   Encounter Date: 11/10/2020   PT End of Session - 11/10/20 1101    Visit Number 22    Number of Visits 25    Date for PT Re-Evaluation 12/23/20    Authorization Type BCBS, Medicare    PT Start Time 1016    PT Stop Time 1058    PT Time Calculation (min) 42 min    Equipment Utilized During Treatment Gait belt    Activity Tolerance Patient tolerated treatment well    Behavior During Therapy WFL for tasks assessed/performed           Past Medical History:  Diagnosis Date  . Allergic rhinitis   . Allergy   . Anxiety   . Asthma   . CHF (congestive heart failure) ([place])   . Diabetes ([place])   . Diverticulitis   . Dyspnea   . GERD (gastroesophageal reflux disease)    very occ  . History of heart attack   . HLD (hyperlipidemia)   . HTN (hypertension)   . Myocardial infarction ([place]) 2000  . Sleep apnea    wears cpap   . Tubular adenoma of colon 09/2008    Past Surgical History:  Procedure Laterality Date  . CARDIAC CATHETERIZATION N/A 07/20/2016   Procedure: Left Heart Cath and Cors/Grafts Angiography;  Surgeon: Belva Crome, MD;  Location: [facility];  Service: Cardiovascular;  Laterality: N/A;  . CATARACT EXTRACTION Bilateral 06/06/2019   Dr. Tommy Rainwater. oct left, dec right 2020   . COLONOSCOPY    . CORONARY ANGIOPLASTY    . CORONARY ARTERY BYPASS GRAFT  06/1999  . POLYPECTOMY    . RENAL BIOPSY  06/2020  . TONSILLECTOMY     late50's early 60's    There were no vitals filed for this visit.   Subjective Assessment - 11/10/20 1018    Subjective Doing good. No falls. Sees the surgeon today at 2 PM.    Patient is accompained by: Family member   beth    Pertinent History L MCA CVA, Coronary Artery Disease, Heart Failure, Hyperlipidemia, Osteoarthritis, GERD, Renal Cell Cancer requiring nephrectomy (active)    Limitations Standing;Walking;Other (comment)   stairs   How long can you walk comfortably? approx. 100' with RW    Patient Stated Goals go up and down the stairs by himself, find out what his new normal will be, work on strength and flexbility.    Currently in Pain? No/denies                             [facility] Adult PT Treatment/Exercise - 11/10/20 0001      Transfers   Comments 10 reps sit <> stands from elevated mat table using BUE support to stand (cues for nose over toes) and then no UE support when lowering for eccentric control; performed standing on blue air ex      Knee/Hip Exercises: Aerobic   Stepper SciFit with  BLE only for strengthening, activity tolerance at gear 3.2 for 5 minutes               Balance Exercises - 11/10/20 1043      Balance Exercises: Standing   Standing  Eyes Opened --    Standing Eyes Closed Narrow base of support (BOS);Foam/compliant surface    Standing Eyes Closed Limitations blue air ex: 4 x 20-25 seconds, cues for incr weight shift to L, as pt with tendency to lose balance to R    SLS with Vectors Upper extremity assist 2;Foam/compliant surface    SLS with Vectors Limitations alternating SLS taps to 2 cones while standing on red mat, no UE/and single UE support, x12 reps each leg - min A for balance at times    Stepping Strategy Posterior;Foam/compliant surface;10 reps    Stepping Strategy Limitations on blue mat, x10 reps, cues for sequencing of stepping and incr step length    Other Standing Exercises red/blue mats next to countertop (each down and back x3 times): fwds marching with fingertip support, tandem w/ fingertip support, retro gait with intermittent UE assist               PT Short Term Goals - 10/24/20 1616      PT SHORT TERM GOAL #1   Title ALL STGS =  LTGS             PT Long Term Goals - 10/24/20 1618      PT LONG TERM GOAL #1   Title Pt will be independent with final HEP in order to build upon functional gains made in therapy. ALL LTGS DUE 11/21/20    Baseline pt reports performing exercises more consistently at home - will benefit from ongoing additions.    Time 4    Period Weeks    Status On-going    Target Date 11/21/20      PT LONG TERM GOAL #2   Title Pt will improve DGI to at least 16/24 in order to demo decr fall risk.    Baseline 14/24 on DGI on 10/24/20    Time 4    Period Weeks    Status Revised      PT LONG TERM GOAL #3   Title Pt will improve gait speed with LRAD to at least 2.2 ft/sec in order to demo improved community mobility.    Baseline 1.89 seconds with SPC with quad tip (improved from 1.60 ft/sec with LRAD)    Time 4    Period Weeks    Status Revised      PT LONG TERM GOAL #4   Title Pt will perform at least 10 sit <> stands during 30 escond chair stand with BUE support in order to demo improved BLE strength.    Baseline 30 second chair stand: 8 sit <> stands with BUE support from standard mat table    Time 4    Period Weeks    Status New      PT LONG TERM GOAL #5   Title Pt will ambulate at least 500' with supervision over indoor/outdoor surfaces with LRAD and perform a curb in order to demo improved community mobility.    Baseline with supervision/min guard with spc with 4 prong tip.    Time 4    Period Weeks    Status Revised                 Plan - 11/10/20 1148    Clinical Impression Statement Today's skilled session continued to focus on BLE strengthening, standing balance strategies on compliant surfaces with vision removed/SLS, and dynamic gait tasks. Pt tolerated session well, with less rest breaks needed. Pt with incr difficulty with SLS activities on compliant surfaces  and with vision removed, pt tendency to sway to the R. Will continue to progress towards LTGs?    Personal  Factors and Comorbidities Comorbidity 3+;Past/Current Experience    Comorbidities L MCA CVA, Coronary Artery Disease, Heart Failure, Hyperlipidemia, Osteoarthritis, GERD, Renal Cell Cancer requiring nephrectomy (active)    Examination-Activity Limitations Locomotion Level;Stand;Transfers;Stairs    Examination-Participation Restrictions Community Activity;Occupation   work (is a Customer service manager)   [employer] Stable/Uncomplicated    Rehab Potential Good    PT Frequency 2x / week    PT Duration 4 weeks    PT Treatment/Interventions ADLs/Self Care Home Management;Stair training;Gait training;DME Instruction;Functional mobility training;Therapeutic activities;Therapeutic exercise;Balance training;Neuromuscular re-education;Patient/family education;Orthotic Fit/Training;Passive range of motion;Vestibular;Energy conservation    PT Next Visit Plan functional BLE strength. continue work on sit <> Stands. SLS balance, balance on unlevel surfaces, balance with head motions, esp nods. SciFit for strengthening    PT Home Exercise Plan added eccentric sitting to HEP, 5 reps per set    Consulted and Agree with Plan of Care Patient;Family member/caregiver    Family Member Consulted wife, [name]           Patient will benefit from skilled therapeutic intervention in order to improve the following deficits and impairments:  Abnormal gait,Decreased activity tolerance,Decreased balance,Decreased endurance,Decreased knowledge of use of DME,Decreased range of motion,Decreased strength,Difficulty walking,Decreased safety awareness  Visit Diagnosis: Unsteadiness on feet  Muscle weakness (generalized)  Other lack of coordination  Other abnormalities of gait and mobility     Problem List Patient Active Problem List   Diagnosis Date Noted  . Nonischemic cardiomyopathy ([place]) 10/06/2020  . Cryptogenic stroke ([place]) 10/06/2020  . Acute on chronic combined systolic and diastolic CHF (congestive  heart failure) ([place]) 07/17/2020  . Renal cell carcinoma ([place]) 07/17/2020  . Protein-calorie malnutrition, severe 07/08/2020  . Acute respiratory failure with hypoxia ([place]) 07/05/2020  . Iron deficiency anemia due to chronic blood loss 06/19/2020  . OSA (obstructive sleep apnea) 01/09/2018  . Pulmonary nodule, left 03/15/2017  . Former smoker 11/04/2014  . Insomnia 05/27/2014  . Plantar wart of left foot 11/27/2010  . Type 2 diabetes mellitus with other specified complication ([facility]) 81/15/7262  . PSA, INCREASED 09/15/2009  . Diverticulitis of colon 05/08/2009  . Asthma, moderate persistent 06/19/2008  . ANKLE PAIN, CHRONIC 06/05/2007  . Allergic rhinitis 05/17/2007  . ERECTILE DYSFUNCTION, SECONDARY TO MEDICATION 05/17/2007  . Hyperlipidemia 04/06/2007  . Anxiety state 04/06/2007  . Essential hypertension 04/06/2007  . Coronary artery disease involving native coronary artery of native heart without angina pectoris 04/06/2007    Arliss Journey, PT, DPT  11/10/2020, 11:50 AM  [facility] [address] [place], [place], [address] Phone: [phone]   Fax:  [phone]  Name: Larry Garner MRN: [id number] Date of Birth: May 30, 1956

## 2020-11-10 NOTE — Patient Instructions (Signed)
VNeST grid Transitive verbs list

## 2020-11-12 ENCOUNTER — Ambulatory Visit: Payer: BC Managed Care – PPO

## 2020-11-12 ENCOUNTER — Other Ambulatory Visit: Payer: Self-pay

## 2020-11-12 DIAGNOSIS — R2681 Unsteadiness on feet: Secondary | ICD-10-CM

## 2020-11-12 DIAGNOSIS — M6281 Muscle weakness (generalized): Secondary | ICD-10-CM

## 2020-11-12 DIAGNOSIS — I69854 Hemiplegia and hemiparesis following other cerebrovascular disease affecting left non-dominant side: Secondary | ICD-10-CM

## 2020-11-12 DIAGNOSIS — R41841 Cognitive communication deficit: Secondary | ICD-10-CM

## 2020-11-12 DIAGNOSIS — R4701 Aphasia: Secondary | ICD-10-CM

## 2020-11-12 NOTE — Patient Instructions (Signed)
   Use a view finder for reading words and numbers to alleviate visual distractions which result in [place] saying incorrect numbers.

## 2020-11-12 NOTE — Therapy (Signed)
[name] [address] [place], [place], [address] Phone: [phone]   Fax:  [phone]  Physical Therapy Treatment  Patient Details  Name: Larry Garner MRN: [id number] Date of Birth: 1956/04/15 Referring Provider (PT): Redmond, Bea Laura, NP   Encounter Date: 11/12/2020   PT End of Session - 11/12/20 1033    Visit Number 23    Number of Visits 25    Date for PT Re-Evaluation 12/23/20    Authorization Type BCBS, Medicare    PT Start Time 1015    PT Stop Time 1100    PT Time Calculation (min) 45 min    Equipment Utilized During Treatment Gait belt    Activity Tolerance Patient tolerated treatment well    Behavior During Therapy [facility] for tasks assessed/performed           Past Medical History:  Diagnosis Date  . Allergic rhinitis   . Allergy   . Anxiety   . Asthma   . CHF (congestive heart failure) ([place])   . Diabetes ([place])   . Diverticulitis   . Dyspnea   . GERD (gastroesophageal reflux disease)    very occ  . History of heart attack   . HLD (hyperlipidemia)   . HTN (hypertension)   . Myocardial infarction ([place]) 2000  . Sleep apnea    wears cpap   . Tubular adenoma of colon 09/2008    Past Surgical History:  Procedure Laterality Date  . CARDIAC CATHETERIZATION N/A 07/20/2016   Procedure: Left Heart Cath and Cors/Grafts Angiography;  Surgeon: Belva Crome, MD;  Location: [facility];  Service: Cardiovascular;  Laterality: N/A;  . CATARACT EXTRACTION Bilateral 06/06/2019   Dr. Tommy Rainwater. oct left, dec right 2020   . COLONOSCOPY    . CORONARY ANGIOPLASTY    . CORONARY ARTERY BYPASS GRAFT  06/1999  . POLYPECTOMY    . RENAL BIOPSY  06/2020  . TONSILLECTOMY     late50's early 60's    There were no vitals filed for this visit.   Subjective Assessment - 11/12/20 1236    Subjective Concerned about continued weight loss and has been trying to gain weight before he will be cleared for surgery.   discussed with patient and spouse ways to gain weight focused on increasing protein intake    Patient is accompained by: Family member   beth   Pertinent History L MCA CVA, Coronary Artery Disease, Heart Failure, Hyperlipidemia, Osteoarthritis, GERD, Renal Cell Cancer requiring nephrectomy (active)    Limitations Standing;Walking;Other (comment)   stairs   How long can you walk comfortably? approx. 100' with RW    Patient Stated Goals go up and down the stairs by himself, find out what his new normal will be, work on strength and flexbility.    Currently in Pain? No/denies                             [facility] Adult PT Treatment/Exercise - 11/12/20 0001      Transfers   Transfers Sit to Stand    Sit to Stand 5: Supervision;4: Min guard    Comments 10x with OH flexion for balance, 10x from airex with OH fleixon      Knee/Hip Exercises: Aerobic   Stepper Scifit BLEs only, 3.2 x6'               Balance Exercises - 11/12/20 0001  Balance Exercises: Standing   Stepping Strategy Anterior;Lateral;10 reps;Limitations    Stepping Strategy Limitations performed with 4" block, 10 reps ea. direction per LE    Step Ups Forward;UE support 1;Limitations    Step Ups Limitations performed runners step from foam surface, 15x per LE    Tandem Gait Forward;Retro;Upper extremity support;5 reps;Limitations    Tandem Gait Limitations UE support needed             PT Education - 11/12/20 1240    Education Details discussed nutritional ways to gait weight focused on protein intake, cautioned to discuss matter with MDs and upcoming nutritional consult    Person(s) Educated Patient;Spouse    Methods Explanation    Comprehension Verbalized understanding            PT Short Term Goals - 10/24/20 1616      PT SHORT TERM GOAL #1   Title ALL STGS = LTGS             PT Long Term Goals - 10/24/20 1618      PT LONG TERM GOAL #1   Title Pt will be independent with final  HEP in order to build upon functional gains made in therapy. ALL LTGS DUE 11/21/20    Baseline pt reports performing exercises more consistently at home - will benefit from ongoing additions.    Time 4    Period Weeks    Status On-going    Target Date 11/21/20      PT LONG TERM GOAL #2   Title Pt will improve DGI to at least 16/24 in order to demo decr fall risk.    Baseline 14/24 on DGI on 10/24/20    Time 4    Period Weeks    Status Revised      PT LONG TERM GOAL #3   Title Pt will improve gait speed with LRAD to at least 2.2 ft/sec in order to demo improved community mobility.    Baseline 1.89 seconds with SPC with quad tip (improved from 1.60 ft/sec with LRAD)    Time 4    Period Weeks    Status Revised      PT LONG TERM GOAL #4   Title Pt will perform at least 10 sit <> stands during 30 escond chair stand with BUE support in order to demo improved BLE strength.    Baseline 30 second chair stand: 8 sit <> stands with BUE support from standard mat table    Time 4    Period Weeks    Status New      PT LONG TERM GOAL #5   Title Pt will ambulate at least 500' with supervision over indoor/outdoor surfaces with LRAD and perform a curb in order to demo improved community mobility.    Baseline with supervision/min guard with spc with 4 prong tip.    Time 4    Period Weeks    Status Revised                 Plan - 11/12/20 1240    Clinical Impression Statement Todays skiled session concentrated on discussing nutrition to facilitate weight gain as well as LE strength training with balance tasks interwoven, rest breaks provided as needed, activities focused on stepping from compliant sufaces, SLS tasks with emphasis on large LE muscle groups    Personal Factors and Comorbidities Comorbidity 3+;Past/Current Experience    Comorbidities L MCA CVA, Coronary Artery Disease, Heart Failure, Hyperlipidemia, Osteoarthritis, GERD, Renal Cell  Cancer requiring nephrectomy (active)     Examination-Activity Limitations Locomotion Level;Stand;Transfers;Stairs    Examination-Participation Restrictions Community Activity;Occupation   work (is a Customer service manager)   [employer] Stable/Uncomplicated    Rehab Potential Good    PT Frequency 2x / week    PT Duration 4 weeks    PT Treatment/Interventions ADLs/Self Care Home Management;Stair training;Gait training;DME Instruction;Functional mobility training;Therapeutic activities;Therapeutic exercise;Balance training;Neuromuscular re-education;Patient/family education;Orthotic Fit/Training;Passive range of motion;Vestibular;Energy conservation    PT Next Visit Plan f/u on nutrition concerns, continue teratment to improve LE strength and muscle mass while incorporating balance tasks    PT Home Exercise Plan added eccentric sitting to HEP, 5 reps per set    Consulted and Agree with Plan of Care Patient;Family member/caregiver    Family Member Consulted wife, [name]           Patient will benefit from skilled therapeutic intervention in order to improve the following deficits and impairments:  Abnormal gait,Decreased activity tolerance,Decreased balance,Decreased endurance,Decreased knowledge of use of DME,Decreased range of motion,Decreased strength,Difficulty walking,Decreased safety awareness  Visit Diagnosis: Unsteadiness on feet  Muscle weakness (generalized)  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 Nonischemic cardiomyopathy ([place]) 10/06/2020  . Cryptogenic stroke ([place]) 10/06/2020  . Acute on chronic combined systolic and diastolic CHF (congestive heart failure) ([place]) 07/17/2020  . Renal cell carcinoma ([place]) 07/17/2020  . Protein-calorie malnutrition, severe 07/08/2020  . Acute respiratory failure with hypoxia ([place]) 07/05/2020  . Iron deficiency anemia due to chronic blood  loss 06/19/2020  . OSA (obstructive sleep apnea) 01/09/2018  . Pulmonary nodule, left 03/15/2017  . Former smoker 11/04/2014  . Insomnia 05/27/2014  . Plantar wart of left foot 11/27/2010  . Type 2 diabetes mellitus with other specified complication ([facility]) 61/95/0932  . PSA, INCREASED 09/15/2009  . Diverticulitis of colon 05/08/2009  . Asthma, moderate persistent 06/19/2008  . ANKLE PAIN, CHRONIC 06/05/2007  . Allergic rhinitis 05/17/2007  . ERECTILE DYSFUNCTION, SECONDARY TO MEDICATION 05/17/2007  . Hyperlipidemia 04/06/2007  . Anxiety state 04/06/2007  . Essential hypertension 04/06/2007  . Coronary artery disease involving native coronary artery of native heart without angina pectoris 04/06/2007    Lanice Shirts PT 11/12/2020, 12:58 PM  [facility] [address] [place], [place], [address] Phone: [phone]   Fax:  [phone]  Name: MAVERIK FOOT MRN: [id number] Date of Birth: 12-16-1955

## 2020-11-12 NOTE — Therapy (Signed)
[facility] [address] [place], [place], [address] Phone: [phone]   Fax:  [phone]  Speech Language Pathology Treatment  Patient Details  Name: Larry Garner MRN: [id number] Date of Birth: 1956/01/02 Referring Provider (SLP): Teresa Coombs, [place]   Encounter Date: 11/12/2020   End of Session - 11/12/20 1439    Visit Number 14    Number of Visits 17    Date for SLP Re-Evaluation 12/05/20    SLP Start Time 1103    SLP Stop Time  1145    SLP Time Calculation (min) 42 min    Activity Tolerance Patient tolerated treatment well           Past Medical History:  Diagnosis Date  . Allergic rhinitis   . Allergy   . Anxiety   . Asthma   . CHF (congestive heart failure) ([place])   . Diabetes ([place])   . Diverticulitis   . Dyspnea   . GERD (gastroesophageal reflux disease)    very occ  . History of heart attack   . HLD (hyperlipidemia)   . HTN (hypertension)   . Myocardial infarction ([place]) 2000  . Sleep apnea    wears cpap   . Tubular adenoma of colon 09/2008    Past Surgical History:  Procedure Laterality Date  . CARDIAC CATHETERIZATION N/A 07/20/2016   Procedure: Left Heart Cath and Cors/Grafts Angiography;  Surgeon: Belva Crome, MD;  Location: [facility];  Service: Cardiovascular;  Laterality: N/A;  . CATARACT EXTRACTION Bilateral 06/06/2019   Dr. Tommy Rainwater. oct left, dec right 2020   . COLONOSCOPY    . CORONARY ANGIOPLASTY    . CORONARY ARTERY BYPASS GRAFT  06/1999  . POLYPECTOMY    . RENAL BIOPSY  06/2020  . TONSILLECTOMY     late50's early 60's    There were no vitals filed for this visit.   Subjective Assessment - 11/12/20 1109    Subjective "We have to learn to eat appropriately."    Patient is accompained by: Family member   Larry Garner - sife   Currently in Pain? No/denies                 ADULT SLP TREATMENT - 11/12/20 1110      General Information   Behavior/Cognition  Alert;Cooperative;Pleasant mood      Treatment Provided   Treatment provided Cognitive-Linquistic      Cognitive-Linquistic Treatment   Treatment focused on Aphasia;Cognition    Skilled Treatment Pt and wife gave example of pt executive function skills WNL in that he is "fighting the parking garage downtown" in an ongoing misunderstanding about parking pass. In complex conversation (why pt is not "household problem solver" as he was prior to cancer dx, adn recall from kidney surgeon) pt deficit in expressive language was more apparent with more conversational fillers and pausing/hesitation than in lighter pleasantries prior to starting ST. Pt used compensations for pre-planning (wrote down concerns/questions, and thgouth about vocabulary necessary for that conversation suggested at previous session/s and pt did not have issues with anomia. "It's not been a big issue." "But numbers still are.", wife tells SLP. Example: account number verbally stated was incorrect. SLP practiced with pt with dollar amounts. Pt success decreased as he progressed through the task. When SLP provided pt a view finder out of a business card his success incr/d to 100% the next 7/7 stimuli. SLP suggested to pt/wife pt use viewfinder for paying bills, and for  other reading pt desires. He stated that "sometimes" it's harder to read informational material from MDs and SLP postulated it was due to visual attention deficit/s and provided suggestion of the viewfinder. Larry Garner and Larry Garner thanked SLP.      Assessment / Recommendations / Plan   Plan Continue with current plan of care   assume pt d/c in 3-5 sessions     Progression Toward Goals   Progression toward goals Progressing toward goals            SLP Education - 11/12/20 1439    Education Details using a viewfinder for reading    Person(s) Educated Patient;Spouse    Methods Explanation;Demonstration    Comprehension Verbalized understanding;Returned demonstration             SLP Short Term Goals - 10/27/20 1148      SLP SHORT TERM GOAL #1   Title pt will name 8 items in abstract categories in 5/6 opportunities in 3 sessions    Status Partially Met      SLP [place] #2   Title pt will describe to SLP 3 anomia compensations    Status Deferred      SLP SHORT TERM GOAL #3   Title pt will verbally sequence pertinent routines for pt with modified independence in 3 sessions    Status Partially Met      SLP SHORT TERM GOAL #4   Title pt will participate functionally in 10 minutes mod complex conversation using compensations for anomia successfully    Baseline 10-15-20    Status Achieved      SLP SHORT TERM GOAL #5   Title pt will participate in further testing as necessary (WAB-R or if pt desires, CLQT)    Period --   or 5 total sessions   Status Deferred            SLP Long Term Goals - 11/12/20 1441      SLP LONG TERM GOAL #1   Title pt will participate functionally in 20 minutes mod complex/complex conversation using compensations for anomia successfully in 3 sessions    Baseline 10-29-20, 11-03-20    Period --   or 17 total sessions, for all LTGs   Status Achieved      SLP LONG TERM GOAL #2   Title pt will express numbers with 100% success with self correction in 3 sessions    Baseline 11-03-20, 11-05-20    Time 2    Period Weeks    Status On-going      SLP LONG TERM GOAL #3   Title pt will listen to a 20 minute discourse (lecture, TED talk, etc) and functionally give a verbal synopsis with compensations (notes, anomia compensations, etc) in 3 sessions    Baseline 11-03-20    Time 2    Period Weeks    Status On-going            Plan - 11/12/20 1440    Clinical Impression Statement Larry Garner presents today with reported minimal episodes of word finding in mod-complex conversation. Pt is making slow progress with detail attention, problem solving, attention necessary for medication administration. SLP believes pt would cont to benefit  from skilled ST to target expressive aphasia and if pt desires, cognitive communication/attention skills. Assume d/c in next 3-4 visits. He and wife deny swallowing difficulties during meals.    Speech Therapy Frequency 2x / week    Duration 8 weeks   17   Treatment/Interventions  Language facilitation;Cueing hierarchy;SLP instruction and feedback;Compensatory strategies;Patient/family education;Internal/external aids;Cognitive reorganization;Multimodal communcation approach;Functional tasks    Potential to Achieve Goals Good           Patient will benefit from skilled therapeutic intervention in order to improve the following deficits and impairments:   Aphasia  Cognitive communication deficit    Problem List Patient Active Problem List   Diagnosis Date Noted  . Nonischemic cardiomyopathy ([place]) 10/06/2020  . Cryptogenic stroke ([place]) 10/06/2020  . Acute on chronic combined systolic and diastolic CHF (congestive heart failure) ([place]) 07/17/2020  . Renal cell carcinoma ([place]) 07/17/2020  . Protein-calorie malnutrition, severe 07/08/2020  . Acute respiratory failure with hypoxia ([place]) 07/05/2020  . Iron deficiency anemia due to chronic blood loss 06/19/2020  . OSA (obstructive sleep apnea) 01/09/2018  . Pulmonary nodule, left 03/15/2017  . Former smoker 11/04/2014  . Insomnia 05/27/2014  . Plantar wart of left foot 11/27/2010  . Type 2 diabetes mellitus with other specified complication ([place]) 43/53/9122  . PSA, INCREASED 09/15/2009  . Diverticulitis of colon 05/08/2009  . Asthma, moderate persistent 06/19/2008  . ANKLE PAIN, CHRONIC 06/05/2007  . Allergic rhinitis 05/17/2007  . ERECTILE DYSFUNCTION, SECONDARY TO MEDICATION 05/17/2007  . Hyperlipidemia 04/06/2007  . Anxiety state 04/06/2007  . Essential hypertension 04/06/2007  . Coronary artery disease involving native coronary artery of native heart without angina pectoris 04/06/2007    [facility] ,MS, CCC-SLP  11/12/2020,  2:42 PM  [facility] [address] [place], [place], [address] Phone: [phone]   Fax:  [phone]   Name: Larry Garner MRN: [id number] Date of Birth: September 02, 1955

## 2020-11-12 NOTE — Patient Instructions (Addendum)
discussed nutritional ways to gait weight focused on protein intake, cautioned to discuss matter with MDs and upcoming nutritional consult

## 2020-11-14 NOTE — Telephone Encounter (Signed)
Pt.'s wife is confused about instructions. She says the box states pt start with 0.25, but Keba told her 0.5. She wants clarifcation. Wife is also concerned about pt losing weight, due to this medication. He is scheduled for surgery and cannot get surgery if he keeps losing weight. Please advise

## 2020-11-15 ENCOUNTER — Encounter: Payer: Self-pay | Admitting: Family Medicine

## 2020-11-17 ENCOUNTER — Encounter: Payer: Self-pay | Admitting: Physical Therapy

## 2020-11-17 ENCOUNTER — Ambulatory Visit: Payer: BC Managed Care – PPO | Admitting: Physical Therapy

## 2020-11-17 ENCOUNTER — Other Ambulatory Visit: Payer: Self-pay

## 2020-11-17 ENCOUNTER — Ambulatory Visit: Payer: BC Managed Care – PPO

## 2020-11-17 DIAGNOSIS — R41841 Cognitive communication deficit: Secondary | ICD-10-CM

## 2020-11-17 DIAGNOSIS — R2681 Unsteadiness on feet: Secondary | ICD-10-CM | POA: Diagnosis not present

## 2020-11-17 DIAGNOSIS — R2689 Other abnormalities of gait and mobility: Secondary | ICD-10-CM

## 2020-11-17 DIAGNOSIS — M6281 Muscle weakness (generalized): Secondary | ICD-10-CM

## 2020-11-17 DIAGNOSIS — R278 Other lack of coordination: Secondary | ICD-10-CM

## 2020-11-17 NOTE — Therapy (Signed)
[facility] [address] [place], [place], [address] Phone: [phone]   Fax:  [phone]  Speech Language Pathology Treatment  Patient Details  Name: Larry Garner MRN: [id number] Date of Birth: 1955-10-19 Referring Provider (SLP): Larry Garner, [place]   Encounter Date: 11/17/2020   End of Session - 11/17/20 1726    Visit Number 15    Number of Visits 17    Date for SLP Re-Evaluation 12/05/20    SLP Start Time 1104    SLP Stop Time  1145    SLP Time Calculation (min) 41 min    Activity Tolerance Patient tolerated treatment well           Past Medical History:  Diagnosis Date  . Allergic rhinitis   . Allergy   . Anxiety   . Asthma   . CHF (congestive heart failure) ([place])   . Diabetes ([place])   . Diverticulitis   . Dyspnea   . GERD (gastroesophageal reflux disease)    very occ  . History of heart attack   . HLD (hyperlipidemia)   . HTN (hypertension)   . Myocardial infarction ([place]) 2000  . Sleep apnea    wears cpap   . Tubular adenoma of colon 09/2008    Past Surgical History:  Procedure Laterality Date  . CARDIAC CATHETERIZATION N/A 07/20/2016   Procedure: Left Heart Cath and Cors/Grafts Angiography;  Surgeon: Larry Crome, MD;  Location: [facility];  Service: Cardiovascular;  Laterality: N/A;  . CATARACT EXTRACTION Bilateral 06/06/2019   Dr. Tommy Garner. oct left, dec right 2020   . COLONOSCOPY    . CORONARY ANGIOPLASTY    . CORONARY ARTERY BYPASS GRAFT  06/1999  . POLYPECTOMY    . RENAL BIOPSY  06/2020  . TONSILLECTOMY     late50's early 60's    There were no vitals filed for this visit.   Subjective Assessment - 11/17/20 1109    Subjective "Larry Garner was patient and drew lots of check copies."    Currently in Pain? No/denies                 ADULT SLP TREATMENT - 11/17/20 1109      General Information   Behavior/Cognition Alert;Cooperative;Pleasant mood      Treatment  Provided   Treatment provided Cognitive-Linquistic      Cognitive-Linquistic Treatment   Treatment focused on Aphasia;Cognition    Skilled Treatment Pt wife gave examples of decr'd attention, awareness of errors, and incr'd word finding after the medication change on Thursday evening. Pt without any overt s/sx CVA, nor any falls since last session. SLP assessment leads to believe these are due to changes to pt's meds. Given his "s" statement, SLP assisted pt today with checks. SLP mod A required initially , faded to modified independence for error awareness and processing. By third check pt req'd extra time. Pt to finish remainder of checks at home. SLP suggested to wife to monitor pt's cognition.      Assessment / Recommendations / Plan   Plan Other (Comment)   decr to once/week     Progression Toward Goals   Progression toward goals Progressing toward goals              SLP Short Term Goals - 10/27/20 1148      SLP SHORT TERM GOAL #1   Title pt will name 8 items in abstract categories in 5/6 opportunities in 3 sessions  Status Partially Met      SLP SHORT TERM GOAL #2   Title pt will describe to SLP 3 anomia compensations    Status Deferred      SLP SHORT TERM GOAL #3   Title pt will verbally sequence pertinent routines for pt with modified independence in 3 sessions    Status Partially Met      SLP SHORT TERM GOAL #4   Title pt will participate functionally in 10 minutes mod complex conversation using compensations for anomia successfully    Baseline 10-15-20    Status Achieved      SLP SHORT TERM GOAL #5   Title pt will participate in further testing as necessary (WAB-R or if pt desires, CLQT)    Period --   or 5 total sessions   Status Deferred            SLP Long Term Goals - 11/17/20 1726      SLP LONG TERM GOAL #1   Title pt will participate functionally in 20 minutes mod complex/complex conversation using compensations for anomia successfully in 3 sessions     Baseline 10-29-20, 11-03-20    Period --   or 17 total sessions, for all LTGs   Status Achieved      SLP LONG TERM GOAL #2   Title pt will express numbers with 100% success with self correction in 3 sessions    Baseline 11-03-20, 11-05-20    Time 1    Period Weeks    Status On-going      SLP LONG TERM GOAL #3   Title pt will listen to a 20 minute discourse (lecture, TED talk, etc) and functionally give a verbal synopsis with compensations (notes, anomia compensations, etc) in 3 sessions    Baseline 11-03-20    Time 1    Period Weeks    Status On-going            Plan - 11/17/20 1120    Clinical Impression Statement [place] presents today with reported minimal episodes of word finding in mod-complex conversation. Pt is making slow progress with detail attention, problem solving, attention necessary for medication administration. SLP believes pt would cont to benefit from skilled ST to target expressive aphasia and if pt desires, cognitive communication/attention skills. Assume d/c in next 3-4 visits. He and wife deny swallowing difficulties during meals.    Speech Therapy Frequency 1x /week    Duration 8 weeks   17   Treatment/Interventions Language facilitation;Cueing hierarchy;SLP instruction and feedback;Compensatory strategies;Patient/family education;Internal/external aids;Cognitive reorganization;Multimodal communcation approach;Functional tasks    Potential to Achieve Goals Good           Patient will benefit from skilled therapeutic intervention in order to improve the following deficits and impairments:   Cognitive communication deficit    Problem List Patient Active Problem List   Diagnosis Date Noted  . Nonischemic cardiomyopathy ([place]) 10/06/2020  . Cryptogenic stroke ([place]) 10/06/2020  . Acute on chronic combined systolic and diastolic CHF (congestive heart failure) ([place]) 07/17/2020  . Renal cell carcinoma ([place]) 07/17/2020  . Protein-calorie malnutrition, severe  07/08/2020  . Acute respiratory failure with hypoxia ([place]) 07/05/2020  . Iron deficiency anemia due to chronic blood loss 06/19/2020  . OSA (obstructive sleep apnea) 01/09/2018  . Pulmonary nodule, left 03/15/2017  . Former smoker 11/04/2014  . Insomnia 05/27/2014  . Plantar wart of left foot 11/27/2010  . Type 2 diabetes mellitus with other specified complication ([place]) 84/69/6295  . PSA, INCREASED  09/15/2009  . Diverticulitis of colon 05/08/2009  . Asthma, moderate persistent 06/19/2008  . ANKLE PAIN, CHRONIC 06/05/2007  . Allergic rhinitis 05/17/2007  . ERECTILE DYSFUNCTION, SECONDARY TO MEDICATION 05/17/2007  . Hyperlipidemia 04/06/2007  . Anxiety state 04/06/2007  . Essential hypertension 04/06/2007  . Coronary artery disease involving native coronary artery of native heart without angina pectoris 04/06/2007    [facility] ,MS, CCC-SLP  11/17/2020, 5:28 PM  [facility] [address] [place], [place], [address] Phone: [phone]   Fax:  [phone]   Name: Larry Garner MRN: [id number] Date of Birth: 11/16/1955

## 2020-11-17 NOTE — Therapy (Addendum)
[facility] [address] [place], [place], [address] Phone: [phone]   Fax:  [phone]  Physical Therapy Treatment  Patient Details  Name: Larry Garner MRN: [id number] Date of Birth: Feb 19, 1956 Referring Provider (PT): Redmond, Bea Laura, NP   Encounter Date: 11/17/2020   PT End of Session - 11/17/20 1308    Visit Number 24    Number of Visits 25    Date for PT Re-Evaluation 12/23/20    Authorization Type BCBS, Medicare    PT Start Time 1019   6 minutes non billable due to pt using restroom   PT Stop Time 1101    PT Time Calculation (min) 42 min    Equipment Utilized During Treatment Gait belt    Activity Tolerance Patient tolerated treatment well    Behavior During Therapy [facility] for tasks assessed/performed           Past Medical History:  Diagnosis Date  . Allergic rhinitis   . Allergy   . Anxiety   . Asthma   . CHF (congestive heart failure) ([place])   . Diabetes ([place])   . Diverticulitis   . Dyspnea   . GERD (gastroesophageal reflux disease)    very occ  . History of heart attack   . HLD (hyperlipidemia)   . HTN (hypertension)   . Myocardial infarction ([place]) 2000  . Sleep apnea    wears cpap   . Tubular adenoma of colon 09/2008    Past Surgical History:  Procedure Laterality Date  . CARDIAC CATHETERIZATION N/A 07/20/2016   Procedure: Left Heart Cath and Cors/Grafts Angiography;  Surgeon: Belva Crome, MD;  Location: [facility];  Service: Cardiovascular;  Laterality: N/A;  . CATARACT EXTRACTION Bilateral 06/06/2019   Dr. Tommy Rainwater. oct left, dec right 2020   . COLONOSCOPY    . CORONARY ANGIOPLASTY    . CORONARY ARTERY BYPASS GRAFT  06/1999  . POLYPECTOMY    . RENAL BIOPSY  06/2020  . TONSILLECTOMY     late50's early 60's    There were no vitals filed for this visit.   Subjective Assessment - 11/17/20 1022    Subjective Eating more protein shakes - doing them 2x a day.    Patient  is accompained by: Family member   beth   Pertinent History L MCA CVA, Coronary Artery Disease, Heart Failure, Hyperlipidemia, Osteoarthritis, GERD, Renal Cell Cancer requiring nephrectomy (active)    Limitations Standing;Walking;Other (comment)   stairs   How long can you walk comfortably? approx. 100' with RW    Patient Stated Goals go up and down the stairs by himself, find out what his new normal will be, work on strength and flexbility.    Currently in Pain? No/denies              [facility] PT Assessment - 11/17/20 1051      6 Minute Walk- Baseline   6 Minute Walk- Baseline yes    HR (bpm) 76    02 Sat (%RA) 96 %    Modified Borg Scale for Dyspnea 0- Nothing at all      6 Minute walk- Post Test   6 Minute Walk Post Test yes    HR (bpm) 80    02 Sat (%RA) 97 %    Modified Borg Scale for Dyspnea 0- Nothing at all    Perceived Rate of Exertion (Borg) 9- very light      6 minute walk test results  Endurance additional comments 3MWT = 345' with SPC with 4 prong tip                         [facility] Adult PT Treatment/Exercise - 11/17/20 1035      Transfers   Transfers Sit to Stand    Sit to Stand 5: Supervision;With upper extremity assist    Stand to Sit 5: Supervision;With upper extremity assist    Comments 30 second chair stand: 6 with first attempt, 7 with 2nd attempt, RPE at 8/10      Ambulation/Gait   Ambulation/Gait Yes    Ambulation/Gait Assistance 5: Supervision    Assistive device Straight cane   with 4 prong tip   Gait Pattern Step-through pattern    Ambulation Surface Level;Indoor    Gait velocity 18.31 seconds = 1.79 ft/sec      Therapeutic Activites    Therapeutic Activities Other Therapeutic Activities    Other Therapeutic Activities discussed POC going forwards- due to pt with a decline in his measures and pt not able to get his surgery yet due to a decline in his weight, discussed re-certing for an additional month. discussed importance of  performing exercises at home (aiming to perform 2x a day vs. 1x a day), incr activity level (going on more walks outside with pt's spouse and using RW), when sitting and watching TV using seated peddle bike for strengthening and movement                  PT Education - 11/17/20 1307    Education Details results of goal check, see TA, will continue for an additional 2x week for 4 weeks for strengthening, endurance, balance -if no change in pt progress, will D/C at that time.    Person(s) Educated Patient;Spouse    Methods Explanation    Comprehension Verbalized understanding            PT Short Term Goals - 10/24/20 1616      PT SHORT TERM GOAL #1   Title ALL STGS = LTGS             PT Long Term Goals - 11/17/20 1036      PT LONG TERM GOAL #1   Title Pt will be independent with final HEP in order to build upon functional gains made in therapy. ALL LTGS DUE 11/21/20    Baseline pt reports performing exercises more consistently at home - will benefit from ongoing additions.    Time 4    Period Weeks    Status On-going      PT LONG TERM GOAL #2   Title Pt will improve DGI to at least 16/24 in order to demo decr fall risk.    Baseline 14/24 on DGI on 10/24/20    Time 4    Period Weeks    Status Revised      PT LONG TERM GOAL #3   Title Pt will improve gait speed with LRAD to at least 2.2 ft/sec in order to demo improved community mobility.    Baseline 1.89 seconds with SPC with quad tip; 1.79 ft/sec on 11/17/20    Time 4    Period Weeks    Status Not Met      PT LONG TERM GOAL #4   Title Pt will perform at least 10 sit <> stands during 30 escond chair stand with BUE support in order to demo improved BLE strength.  Baseline 30 second chair stand: 8 sit <> stands with BUE support from standard mat table, 7 sit <> stands on 11/17/20    Time 4    Period Weeks    Status Not Met      PT LONG TERM GOAL #5   Title Pt will ambulate at least 500' with supervision over  indoor/outdoor surfaces with LRAD and perform a curb in order to demo improved community mobility.    Baseline with supervision/min guard with spc with 4 prong tip.    Time 4    Period Weeks    Status Revised                 Plan - 11/17/20 2000    Clinical Impression Statement Today's skilled session focused on assessing pt's LTGs to determine POC going forward. Pt had a decline in his measures with 30 second chair stand and gait speed with use of SPC with quad tip since when goals were last assessed a month ago. Due to pt not being able to get surgery due to weight loss and decr in measures, will continue to work with therapy for an additional month for strengthening/balance/gait training. If pt still with a plateau progress, will take a break from therapy at that time. Performed the 3MWT today with pt walking 76' with SPC with quad tip. Will assess remaining LTGs at next session and perform recert.    Personal Factors and Comorbidities Comorbidity 3+;Past/Current Experience    Comorbidities L MCA CVA, Coronary Artery Disease, Heart Failure, Hyperlipidemia, Osteoarthritis, GERD, Renal Cell Cancer requiring nephrectomy (active)    Examination-Activity Limitations Locomotion Level;Stand;Transfers;Stairs    Examination-Participation Restrictions Community Activity;Occupation   work (is a Customer service manager)   [employer] Stable/Uncomplicated    Rehab Potential Good    PT Frequency 2x / week    PT Duration 4 weeks    PT Treatment/Interventions ADLs/Self Care Home Management;Stair training;Gait training;DME Instruction;Functional mobility training;Therapeutic activities;Therapeutic exercise;Balance training;Neuromuscular re-education;Patient/family education;Orthotic Fit/Training;Passive range of motion;Vestibular;Energy conservation    PT Next Visit Plan check remaining goals and re-cert. continue teratment to improve LE strength and muscle mass while incorporating balance tasks     PT Home Exercise Plan added eccentric sitting to HEP, 5 reps per set    Consulted and Agree with Plan of Care Patient;Family member/caregiver    Family Member Consulted wife, [name]           Patient will benefit from skilled therapeutic intervention in order to improve the following deficits and impairments:  Abnormal gait,Decreased activity tolerance,Decreased balance,Decreased endurance,Decreased knowledge of use of DME,Decreased range of motion,Decreased strength,Difficulty walking,Decreased safety awareness  Visit Diagnosis: Unsteadiness on feet  Muscle weakness (generalized)  Other abnormalities of gait and mobility  Other lack of coordination     Problem List Patient Active Problem List   Diagnosis Date Noted  . Nonischemic cardiomyopathy ([place]) 10/06/2020  . Cryptogenic stroke ([place]) 10/06/2020  . Acute on chronic combined systolic and diastolic CHF (congestive heart failure) ([place]) 07/17/2020  . Renal cell carcinoma ([place]) 07/17/2020  . Protein-calorie malnutrition, severe 07/08/2020  . Acute respiratory failure with hypoxia ([place]) 07/05/2020  . Iron deficiency anemia due to chronic blood loss 06/19/2020  . OSA (obstructive sleep apnea) 01/09/2018  . Pulmonary nodule, left 03/15/2017  . Former smoker 11/04/2014  . Insomnia 05/27/2014  . Plantar wart of left foot 11/27/2010  . Type 2 diabetes mellitus with other specified complication ([place]) 51/09/5850  . PSA, INCREASED 09/15/2009  . Diverticulitis of  colon 05/08/2009  . Asthma, moderate persistent 06/19/2008  . ANKLE PAIN, CHRONIC 06/05/2007  . Allergic rhinitis 05/17/2007  . ERECTILE DYSFUNCTION, SECONDARY TO MEDICATION 05/17/2007  . Hyperlipidemia 04/06/2007  . Anxiety state 04/06/2007  . Essential hypertension 04/06/2007  . Coronary artery disease involving native coronary artery of native heart without angina pectoris 04/06/2007    Arliss Journey, PT, DPT  11/17/2020, 8:04 PM  [facility] [address] [place], [place], [address] Phone: [phone]   Fax:  [phone]  Name: Larry Garner MRN: [id number] Date of Birth: March 10, 1956

## 2020-11-18 ENCOUNTER — Other Ambulatory Visit: Payer: Self-pay

## 2020-11-18 MED ORDER — GLIMEPIRIDE 2 MG PO TABS: 2.0000 mg | ORAL_TABLET | Freq: Every day | ORAL | 5 refills | Status: DC

## 2020-11-19 ENCOUNTER — Telehealth: Payer: Self-pay

## 2020-11-19 ENCOUNTER — Ambulatory Visit: Payer: BC Managed Care – PPO

## 2020-11-19 ENCOUNTER — Ambulatory Visit: Payer: BC Managed Care – PPO | Admitting: Physical Therapy

## 2020-11-19 NOTE — Telephone Encounter (Signed)
Called and lm for [employer].

## 2020-11-19 NOTE — Telephone Encounter (Signed)
Nurse Assessment Nurse: Thad Ranger RN, Langley Gauss Date/Time (Eastern Time): 11/19/2020 8:16:10 AM Confirm and document reason for call. If symptomatic, describe symptoms. ---Caller said her husband had a incident last night. Caller said before bed he started talking but wasn`t making sense. Caller said he urinated on himself 3x before bed. Caller said thru out the night he kept saying things that didn`t make sense. Caller said he is taking immuno therapy for kidney cancer and his diabetic meds was recently changed. Caller said he had a fever of 99.9 and his blood sugar was 286. Caller said she gave him glimperide which helped and made him more lucid. Caller said his blood sugar is now 167 and temp is 98.3. Caller said he is also taking doxycycline for a rash caused by the immuno therapy. Does the patient have any new or worsening symptoms? ---Yes PLEASE NOTE: All timestamps contained within this report are represented as [place] Standard Time. CONFIDENTIALTY NOTICE: This fax transmission is intended only for the addressee. It contains information that is legally privileged, confidential or otherwise protected from use or disclosure. If you are not the intended recipient, you are strictly prohibited from reviewing, disclosing, copying using or disseminating any of this information or taking any action in reliance on or regarding this information. If you have received this fax in error, please notify us immediately by telephone so that we can arrange for its return to [place]. Phone: [phone], Toll-Free: [phone], Fax: [phone] Page: 2 of 2 Call Id: [id number] Nurse Assessment Will a triage be completed? ---Yes Related visit to physician within the last 2 weeks? ---Yes Does the PT have any chronic conditions? (i.e. diabetes, asthma, this includes High risk factors for pregnancy, etc.) ---Yes List chronic conditions. ---Kidney CA w/immuno- therapy, Diabetes, MI w/ stents Is this a  behavioral health or substance abuse call? ---No Guidelines Guideline Title Affirmed Question Affirmed Notes Nurse Date/Time (Eastern Time) Confusion - Delirium [1] Difficult to awaken or acting confused (e.g., disoriented, slurred speech) AND [2] present now AND [3] new-onset Carmon, RN, Langley Gauss 11/19/2020 8:18:38 AM Disp. Time Eilene Ghazi Time) Disposition Final User 11/19/2020 8:14:23 AM Send to Urgent Queue Sundra Aland 9/79/8921 1:94:17 AM 911 Outcome Documentation Carmon, RN, Langley Gauss Reason: Refused to call 911 or take to ER and insist on speaking to the MD. Trans to MDO per request 11/19/2020 8:21:57 AM Call EMS 911 Now Yes Carmon, RN, Yevette Edwards Disagree/Comply Disagree Caller Understands Yes PreDisposition Call Doctor Care Advice Given Per Guideline CALL EMS 911 NOW: CARE ADVICE given per Confusion-Delirium (Adult) guideline. Referrals GO TO FACILITY REFUSED

## 2020-11-19 NOTE — Telephone Encounter (Signed)
Pt scheduled to see Alyssa tomorrow.

## 2020-11-19 NOTE — Telephone Encounter (Signed)
FYI, this is the triage note in reference to previous phone note.

## 2020-11-19 NOTE — Telephone Encounter (Signed)
Called [name] and got [name] scheduled for tomorrow with Alyssa.

## 2020-11-19 NOTE — Telephone Encounter (Signed)
Please schedule with available provider for virtual since Yong Channel will be out of office.

## 2020-11-19 NOTE — Telephone Encounter (Signed)
See note-patient wanted you to call him just-not sure what is needed advisement on at this time.  His sugar was high which can cause confusion and temperature was slightly high.  Could certainly get him tested for Covid.  If needed can schedule a visit with another provider today or tomorrow-unfortunately I do not have any availability due to being out of the office Thursday and Friday and having illness yesterday

## 2020-11-19 NOTE — Telephone Encounter (Signed)
Patients wife called in last night and stated before bed " to get rid of dresses " at 1 am " she needed to get the wood off his back " she asked him to tell them his daughters name and he couldn't tell her. Patients wife states he was not making any sense so she checked his B/S and it was a 286  With a fever of 99. She again took his B/S this morning and it was 167 and she states she just wants to talk to jazz regarding this incident  And medications. Patient also has a PT apt this morning

## 2020-11-19 NOTE — Telephone Encounter (Signed)
Please advise 

## 2020-11-19 NOTE — Telephone Encounter (Signed)
Once again I think patient needs to be assessed but with me with being off on Thursday and Friday and having to leave the office this afternoon for a prior engagement I am unable to work him in.  May need to consider virtual visit and go ahead and get Covid testing with temperature elevation

## 2020-11-20 ENCOUNTER — Telehealth (INDEPENDENT_AMBULATORY_CARE_PROVIDER_SITE_OTHER): Payer: BC Managed Care – PPO | Admitting: Physician Assistant

## 2020-11-20 ENCOUNTER — Other Ambulatory Visit: Payer: Self-pay

## 2020-11-20 ENCOUNTER — Other Ambulatory Visit: Payer: BC Managed Care – PPO

## 2020-11-20 DIAGNOSIS — R410 Disorientation, unspecified: Secondary | ICD-10-CM | POA: Diagnosis not present

## 2020-11-20 DIAGNOSIS — R21 Rash and other nonspecific skin eruption: Secondary | ICD-10-CM | POA: Diagnosis not present

## 2020-11-20 DIAGNOSIS — C642 Malignant neoplasm of left kidney, except renal pelvis: Secondary | ICD-10-CM | POA: Diagnosis not present

## 2020-11-20 LAB — COMPREHENSIVE METABOLIC PANEL
ALT: 20 U/L (ref 0–53)
AST: 17 U/L (ref 0–37)
Albumin: 3 g/dL — ABNORMAL LOW (ref 3.5–5.2)
Alkaline Phosphatase: 278 U/L — ABNORMAL HIGH (ref 39–117)
BUN: 42 mg/dL — ABNORMAL HIGH (ref 6–23)
CO2: 23 mEq/L (ref 19–32)
Calcium: 9.8 mg/dL (ref 8.4–10.5)
Chloride: 102 mEq/L (ref 96–112)
Creatinine, Ser: 1.05 mg/dL (ref 0.40–1.50)
GFR: 74.53 mL/min (ref >60.00)
Glucose, Bld: 110 mg/dL — ABNORMAL HIGH (ref 70–99)
Potassium: 5.1 mEq/L (ref 3.5–5.1)
Sodium: 133 mEq/L — ABNORMAL LOW (ref 135–145)
Total Bilirubin: 0.8 mg/dL (ref 0.2–1.2)
Total Protein: 7.9 g/dL (ref 6.0–8.3)

## 2020-11-20 LAB — CBC WITH DIFFERENTIAL/PLATELET
Basophils Absolute: 0.1 10*3/uL (ref 0.0–0.1)
Basophils Relative: 0.8 % (ref 0.0–3.0)
Eosinophils Absolute: 0.2 10*3/uL (ref 0.0–0.7)
Eosinophils Relative: 1.2 % (ref 0.0–5.0)
HCT: 27.8 % — ABNORMAL LOW (ref 39.0–52.0)
Hemoglobin: 8.7 g/dL — ABNORMAL LOW (ref 13.0–17.0)
Lymphocytes Relative: 6.9 % — ABNORMAL LOW (ref 12.0–46.0)
Lymphs Abs: 0.9 10*3/uL (ref 0.7–4.0)
MCHC: 31.3 g/dL (ref 30.0–36.0)
MCV: 84 fl (ref 78.0–100.0)
Monocytes Absolute: 1.2 10*3/uL — ABNORMAL HIGH (ref 0.1–1.0)
Monocytes Relative: 9.4 % (ref 3.0–12.0)
Neutro Abs: 10.8 10*3/uL — ABNORMAL HIGH (ref 1.4–7.7)
Neutrophils Relative %: 81.7 % — ABNORMAL HIGH (ref 43.0–77.0)
Platelets: 418 10*3/uL — ABNORMAL HIGH (ref 150.0–400.0)
RBC: 3.31 Mil/uL — ABNORMAL LOW (ref 4.22–5.81)
RDW: 20.6 % — ABNORMAL HIGH (ref 11.5–15.5)
WBC: 13.2 10*3/uL — ABNORMAL HIGH (ref 4.0–10.5)

## 2020-11-20 LAB — POCT URINALYSIS DIPSTICK
Bilirubin, UA: NEGATIVE
Blood, UA: NEGATIVE
Glucose, UA: POSITIVE — AB
Ketones, UA: NEGATIVE
Leukocytes, UA: NEGATIVE
Nitrite, UA: NEGATIVE
Protein, UA: POSITIVE — AB
Spec Grav, UA: 1.015 (ref 1.010–1.025)
Urobilinogen, UA: 0.2 E.U./dL
pH, UA: 5 (ref 5.0–8.0)

## 2020-11-20 NOTE — Telephone Encounter (Signed)
Noted  

## 2020-11-20 NOTE — Progress Notes (Addendum)
Virtual Visit via Video Note  I connected with CIEL YANES on 11/20/20 at  9:00 AM EDT by a video enabled telemedicine application and verified that I am speaking with the correct person using two identifiers.  Location: Patient: home Provider: Therapist, music at [place] present for visit: Patient, patient's wife, myself   I discussed the limitations of evaluation and management by telemedicine and the availability of in person appointments. The patient expressed understanding and agreed to proceed.   History of Present Illness: Pleasant patient with his wife on video visit today.  Patient's wife says that 2 nights ago he was not making much sense as he was talking.  He urinated on himself about 3 times before bed and then had 1 more incident in the middle of the night where he was saying things that did not really make sense to her.  Patient is able to recall this and says yes he was unable to make sense of things and felt like he was disoriented.  However, yesterday he started feeling better and like himself and denies having any more episodes as he did the following night.  He was able to eat 3 full meals and converse with friends who had come over. He reports that he ate oatmeal yesterday for breakfast. PB & J sandwiches for lunch. Chicken, pound cake, tossed salad for dinner last night.  Today, patient says he feels the best he has in a long time.  He is currently on immunotherapy for renal cell carcinoma.  He was started on doxycycline for a rash that was possibly caused by his cancer therapy.  Apparently he did have a similar episode the day after he started the doxycycline as well.  COVID-19 test was negative yesterday at home.   He denies any pain.  He does not have any urinary symptoms.  He denies any chest pain or shortness of breath.  He denies any headache or any strokelike symptoms.   Observations/Objective: Patient's wife was able to check vitals at  home: Blood sugar 111. Temp is 96.2 F under the tongue.  100% SpO2  71 bpm  BP: 116/65  Gen: Awake, alert, no acute distress Resp: Breathing is even and non-labored Psych: calm/pleasant demeanor Neuro: Alert and Oriented x 3, + facial symmetry, speech is clear.   Assessment and Plan: 1. Acute delirium 2. Rash and nonspecific skin eruption 3. Renal cell carcinoma of left kidney (HCC)  Symptoms appear to be resolved. He is lucid and vitals are stable (temperature has been running low with his cancer treatment, this is not new). I do want to check STAT CBC, CMET, U/A in office today to ensure that there are no major changes there in the last 9 days. I question if the doxycycline is possibly interacting with Keytruda. Patient and wife are going to call his oncologist today to discuss. They do report that the rash he was experiencing has responded well to the doxycycline and has nearly resolved completely. After video visit with him, I do not see need for ED at this time unless labs tell otherwise or if his symptoms return at all.    Follow Up Instructions:   I discussed the assessment and treatment plan with the patient. The patient was provided an opportunity to ask questions and all were answered. The patient agreed with the plan and demonstrated an understanding of the instructions.   The patient was advised to call back or seek an in-person evaluation if  the symptoms worsen or if the condition fails to improve as anticipated.  This note was prepared with assistance of Systems analyst. Occasional wrong-word or sound-a-like substitutions may have occurred due to the inherent limitations of voice recognition software.  Total time spent with patient on video visit as well as reviewing triage note from yesterday and medications, as well as discussing plan listed above and documentation was 38 minutes today.  Ilan Kahrs M Mende [place], PA-C

## 2020-11-21 LAB — URINE CULTURE
MICRO NUMBER:: 11770846
Result:: NO GROWTH
SPECIMEN QUALITY:: ADEQUATE

## 2020-11-24 ENCOUNTER — Ambulatory Visit: Payer: BC Managed Care – PPO | Admitting: Physical Therapy

## 2020-11-25 ENCOUNTER — Encounter: Payer: BC Managed Care – PPO | Attending: Psychology | Admitting: Psychology

## 2020-11-25 ENCOUNTER — Encounter: Payer: Self-pay | Admitting: Psychology

## 2020-11-25 ENCOUNTER — Other Ambulatory Visit: Payer: Self-pay

## 2020-11-25 DIAGNOSIS — I639 Cerebral infarction, unspecified: Secondary | ICD-10-CM

## 2020-11-25 DIAGNOSIS — R413 Other amnesia: Secondary | ICD-10-CM

## 2020-11-25 NOTE — Progress Notes (Signed)
Neuropsychological Consultation   Patient:   Larry Garner   DOB:   [birth year]  MR Number:  [id number]  Location:  [facility] [place], [place] [id number] [facility] [place] [address] Dept: [phone]           Date of Service:   11/25/2020  Start Time:   9 AM End Time:   11 AM  Today's visit was 2 hours in total duration.  The first hour and 15 minutes were spent in formal clinical interview with the patient, his wife, and myself present.  The other 45 minutes were spent in records review, report writing and setting up testing protocols.  Provider/Observer:  Ilean Skill, Psy.D.       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  Ongoing cognitive changes include issues with reduced memory and learning capacity, residual word finding difficulties and difficulty with cognition particularly around primary mathematical abilities.  The patient is also described generalized weakness and has had several falls.  The patient also has significant issues with his kidneys and is currently on immunotherapy as he has not been cleared to have his kidney removed.  The stroke occurred on 07/31/2020 with imaging at the time identifying left inferior parietal lobe as well as cortex along the left postcentral gyrus and subcortical white matter deep into the superior left central sulcus.  There was associated crypt to toxic edema without substantial mass-effect or space-occupying hemorrhage although there was a tiny volume of hemorrhage involving the inferior left  parietal lobe.  The patient has been having ongoing symptoms although he has improved significantly from his stroke.    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  Ongoing cognitive changes include issues with reduced memory and learning capacity, residual word finding difficulties and difficulty with cognition particularly around primary mathematical abilities.  The patient is also described generalized weakness and has had several falls.  The patient also has significant issues with his kidneys and is currently on immunotherapy as he has not been cleared to have his kidney removed.  The stroke occurred on 07/31/2020 with imaging at the time identifying left inferior parietal lobe as well as cortex along the left postcentral gyrus and subcortical white matter deep into the superior left central sulcus.  There was associated crypt to toxic edema without substantial mass-effect or space-occupying hemorrhage although there was a tiny volume of hemorrhage involving the inferior left parietal lobe.  The patient has been having ongoing symptoms although he has improved significantly from his stroke.  The patient has a past medical history including type 2 diabetes, coronary artery disease, diverticulitis, obstructive sleep apnea with CPAP, hypertension and hyperlipidemia, congestive heart failure, myocardial infarction in 2000, renal cell carcinoma with complications to his care from his December 2021 stroke event.  The patient was scheduled to have surgery for renal  cell cancer on August 06, 2020 and this was postponed because of his stroke.  The patient was more recently started on immunotherapy but he has lost more than 60 pounds and has  become very weak and they are waiting for him to gain strength and gain weight back before they remove his kidney.  Medical records show that the patient is continuing to receive rehabilitative therapies particularly speech and language work as well as other rehab efforts.  The patient is being followed both by cardiology, nephrology and neurology.  The patient describes ongoing symptoms and difficulties following his stroke in December 2021.  The patient is having difficulties keeping up with dates, processing numbers accurately, memory and learning difficulties and weakness in his legs and arms and difficulty writing.  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  The patient's wife reports that she is noting some things that appear to be getting worse more recently.  She has a lot of concerns that the patient is now waking up at night asking strange questions and has recently lost 60 pounds.  Early on there were significant issues with remembering his wife's children's name and other word finding and memory issues.  More recently, the patient is described by his wife is waking up at night and asking strange questions and being quite confused.  However, her description of these events sound like typical sleep disturbance with hypnagogic type dreams and the patient waking up while still having the lingering effects of his dream.  She reports that the strange questions and what she describes as speaking "gibberish" only occur either immediately after waking at night or after taking a nap during the day.  I do not think these things represent a significant change in status beyond a significant sleep disturbance.  The patient's sleep patterns are described as waking around 4 times per night likely due to itching as a side effect of his immunotherapy.  When he wakes up he can be very confused for period of time.  His appetite is described as not particularly good and that he has  lost 60 pounds since this past July and 25 pounds since his stroke.  He has not been able to return to work and he is in the process of deciding whether he will make efforts to return to work or will go ahead and retire.  Behavioral Observation: SLAYDEN MENNENGA  presents as a 65 y.o.-year-old Right Caucasian Male who appeared his stated age. his dress was Appropriate and he was Well Groomed and his manners were Appropriate to the situation.  his participation was indicative of Appropriate and Redirectable behaviors.  There were physical disabilities noted related to physical weakness and difficulties with ambulation with use of assist device.  he displayed an appropriate level of cooperation and motivation.     Interactions:    Active Appropriate and Redirectable  Attention:   abnormal and attention span appeared shorter than expected for age  Memory:   abnormal; remote memory intact, recent memory impaired  Visuo-spatial:  not examined  Speech (Volume):  low  Speech:   normal; some word finding difficulties noted during clinical interview  Thought Process:  Coherent and Relevant  Though Content:  WNL; not suicidal and not homicidal  Orientation:   person, place, time/date and situation  Judgment:   Fair  Planning:   Fair  Affect:    Appropriate  Mood:    Dysphoric  Insight:  Good  Intelligence:   very high  Marital Status/Living: The patient was born and raised in [place] along with 1 sibling.  No major childhood illnesses were noted and developmental milestones were reached at the appropriate time.  The patient currently lives with his wife and adult daughter.  The patient has been married for 40 years and has no previous marriages.  The patient has 2 children age 52 and 5.  Current Employment: The patient is not currently working and is currently without due to disability and has been working as a Chemical engineer.  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  Hobbies and interests include singing and music.  Substance Use:  No concerns of substance abuse are reported.  Education:   The patient achieved his bachelor's of science/bachelors of arts from [employer] and maintained a 2.5 GPA.  He graduated from both high school as well as [employer] in undergraduate school.  Medical History:   Past Medical History:  Diagnosis Date  . Allergic rhinitis   . Allergy   . Anxiety   . Asthma   . CHF (congestive heart failure) ([facility])   . Diabetes ([facility])   . Diverticulitis   . Dyspnea   . GERD (gastroesophageal reflux disease)    very occ  . History of heart attack   . HLD (hyperlipidemia)   . HTN (hypertension)   . Myocardial infarction ([facility]) 2000  . Sleep apnea    wears cpap   . Tubular adenoma of colon 09/2008         Patient Active Problem List   Diagnosis Date Noted  . Nonischemic cardiomyopathy ([facility]) 10/06/2020  . Cryptogenic stroke ([facility]) 10/06/2020  . Acute on chronic combined systolic and diastolic CHF (congestive heart failure) ([facility]) 07/17/2020  . Renal cell carcinoma ([facility]) 07/17/2020  . Protein-calorie malnutrition, severe 07/08/2020  . Acute respiratory failure with hypoxia ([facility]) 07/05/2020  . Iron deficiency anemia due to chronic blood loss 06/19/2020  . OSA (obstructive sleep apnea) 01/09/2018  . Pulmonary nodule, left 03/15/2017  . Former smoker 11/04/2014  . Insomnia 05/27/2014  . Plantar wart of left foot 11/27/2010  . Type 2 diabetes mellitus with other specified complication ([facility]) 90/24/0973  . PSA, INCREASED 09/15/2009  . Diverticulitis of colon 05/08/2009  . Asthma, moderate persistent 06/19/2008  . ANKLE PAIN, CHRONIC 06/05/2007  . Allergic rhinitis 05/17/2007  . ERECTILE DYSFUNCTION, SECONDARY TO MEDICATION 05/17/2007  . Hyperlipidemia 04/06/2007  . Anxiety state 04/06/2007  . Essential hypertension  04/06/2007  . Coronary artery disease involving native coronary artery of native heart without angina pectoris 04/06/2007     Psychiatric History:  The patient does have a history of some degree of anxiety particularly around his acute medical issues.  Family Med/Psych History:  Family History  Problem Relation Age of Onset  . Hypertension Mother   . Alzheimer's disease Mother   . Colon polyps Mother   . Hyperlipidemia Father   . Hypertension Father   . Colon polyps Father   . Colon cancer Paternal Aunt   . Pancreatic cancer Neg Hx   . Stomach cancer Neg Hx   . Thyroid disease Neg Hx   . Esophageal cancer Neg Hx   . 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  Ongoing cognitive changes include issues with reduced memory and learning capacity, residual word finding difficulties and difficulty with cognition particularly around primary mathematical abilities.  The patient is also described generalized weakness and has had several falls.  The patient also has significant issues with his kidneys and is currently on immunotherapy as he has not been cleared to have his kidney removed.  The stroke occurred on 07/31/2020 with imaging at the time identifying left inferior parietal lobe as well as cortex along the left postcentral gyrus and subcortical white matter deep into the superior left central sulcus.  There was associated crypt to toxic edema without substantial mass-effect or space-occupying hemorrhage although there was a tiny volume of hemorrhage involving the inferior left parietal lobe.  The patient has been having ongoing symptoms although he has improved significantly from his  stroke.  Disposition/Plan:  We have set the patient up for follow-up neuropsychological testing.  The patient will complete a foundational battery of the Wechsler Adult Intelligence Scale and the Wechsler Memory Scale's as well as looking at specific expressive language measures.  Once these are completed a determination will be made as to the need for any other formal objective testing to assess any particular areas that may be highlighted during the initial foundational battery.  Once this is completed a formal report will be written and provided to both referring physician, made available in his EMR particularly for the rehab efforts and feedback will be provided to the patient and his wife with recommendations.  Diagnosis:    Memory loss  Cryptogenic stroke [facility])         Electronically Signed   _______________________ Ilean Skill, Psy.D. Clinical Neuropsychologist

## 2020-11-26 ENCOUNTER — Encounter: Payer: Self-pay | Admitting: Physical Therapy

## 2020-11-26 ENCOUNTER — Ambulatory Visit: Payer: BC Managed Care – PPO | Admitting: Physical Therapy

## 2020-11-26 DIAGNOSIS — R2681 Unsteadiness on feet: Secondary | ICD-10-CM

## 2020-11-26 DIAGNOSIS — R2689 Other abnormalities of gait and mobility: Secondary | ICD-10-CM

## 2020-11-26 DIAGNOSIS — M6281 Muscle weakness (generalized): Secondary | ICD-10-CM

## 2020-11-26 NOTE — Patient Instructions (Signed)
Access Code: [facility] URL: [email or web address] Date: 11/26/2020 Prepared by: Janann August  Exercises Sit to Stand with Armchair - 2 x daily - 5 x weekly - 2 sets - 8 reps Side Stepping with Resistance at Thighs and Counter Support - 2 x daily - 5 x weekly - 3 sets Heel rises with counter support - 1-2 x daily - 5 x weekly - 1-2 sets - 10 reps Standing Marching - 1-2 x daily - 5 x weekly - 3 sets Standing Knee Flexion AROM with Chair Support - 1-2 x daily - 5 x weekly - 1-2 sets - 8-10 reps Romberg Stance on Foam Pad - 1-2 x daily - 5 x weekly - 2 sets - 10 reps

## 2020-11-27 NOTE — Therapy (Signed)
[facility] [address] [place], [place], [address] Phone: [phone]   Fax:  [phone]  Physical Therapy Treatment/Re-Cert  Patient Details  Name: Larry Garner MRN: [id number] Date of Birth: Jan 05, 1956 Referring Provider (PT): Redmond, Bea Laura, NP   Encounter Date: 11/26/2020   PT End of Session - 11/27/20 2039    Visit Number 25    Number of Visits 33    Date for PT Re-Evaluation 44/01/02   re-cert on 03/03/35   Authorization Type BCBS, Medicare    Progress Note Due on Visit 30    PT Start Time 1018    PT Stop Time 1101    PT Time Calculation (min) 43 min    Equipment Utilized During Treatment Gait belt    Activity Tolerance Patient tolerated treatment well    Behavior During Therapy WFL for tasks assessed/performed           Past Medical History:  Diagnosis Date  . Allergic rhinitis   . Allergy   . Anxiety   . Asthma   . CHF (congestive heart failure) ([place])   . Diabetes ([place])   . Diverticulitis   . Dyspnea   . GERD (gastroesophageal reflux disease)    very occ  . History of heart attack   . HLD (hyperlipidemia)   . HTN (hypertension)   . Myocardial infarction ([facility]) 2000  . Sleep apnea    wears cpap   . Tubular adenoma of colon 09/2008    Past Surgical History:  Procedure Laterality Date  . CARDIAC CATHETERIZATION N/A 07/20/2016   Procedure: Left Heart Cath and Cors/Grafts Angiography;  Surgeon: Belva Crome, MD;  Location: [facility];  Service: Cardiovascular;  Laterality: N/A;  . CATARACT EXTRACTION Bilateral 06/06/2019   Dr. Tommy Rainwater. oct left, dec right 2020   . COLONOSCOPY    . CORONARY ANGIOPLASTY    . CORONARY ARTERY BYPASS GRAFT  06/1999  . POLYPECTOMY    . RENAL BIOPSY  06/2020  . TONSILLECTOMY     late50's early 60's    There were no vitals filed for this visit.      11/26/20 1022  Symptoms/Limitations  Subjective Saw neuropsych the other day. Was taken off  Ozempic.  Patient is accompained by: Family member (beth)  Pertinent History L MCA CVA, Coronary Artery Disease, Heart Failure, Hyperlipidemia, Osteoarthritis, GERD, Renal Cell Cancer requiring nephrectomy (active)  Limitations Standing;Walking;Other (comment) (stairs)  How long can you walk comfortably? approx. 100' with RW  Patient Stated Goals go up and down the stairs by himself, find out what his new normal will be, work on strength and flexbility.  Pain Assessment  Currently in Pain? No/denies      [facility] PT Assessment - 11/27/20 2043      Assessment   Medical Diagnosis L MCA CVA    Referring Provider (PT) Redmond, Bea Laura, NP    Onset Date/Surgical Date 07/31/20      Prior Function   Level of Independence Independent                       Access Code: [facility] URL: [email or web address] Date: 11/26/2020 Prepared by: Janann August  Reviewed and upgraded HEP as appropriate.   Exercises Sit to Stand with Armchair - 2 x daily - 5 x weekly - 2 sets - 8 reps - incr reps to 8 reps  Side Stepping with Resistance at Thighs and Counter Support - 2 x  daily - 5 x weekly - 3 sets - with red tband around thighs), needed cues for mini squat position when performing  Heel rises with counter support - 1-2 x daily - 5 x weekly - 1-2 sets - 10 reps - upgraded to pt waring 2 lb ankle weights Standing Marching - 1-2 x daily - 5 x weekly - 3 sets - use of red tband around thighs, forward and backwards marching at countertop  Standing Knee Flexion AROM with Chair Support - 1-2 x daily - 5 x weekly - 1-2 sets - 8-10 reps - with 2# ankle weight, cues to not alternate between legs  Romberg Stance on Foam Pad - 1-2 x daily - 5 x weekly - 2 sets - 10 reps   [facility] Adult PT Treatment/Exercise - 11/27/20 2043      Ambulation/Gait   Ambulation/Gait Yes    Ambulation/Gait Assistance 5: Supervision    Ambulation/Gait Assistance Details pt has been ambulating into clinic  at times holding cane in LUE (has previously been holding in RUE) with pt getting off sequence. when asked where pt felt more steady he felt unsure, practiced with cane both ways in each hand with pt demonstrating improved sequencing and more balanced with cane in RUE. pt had difficulty sequencing cane in LUE and getting it almost caught up with L foot    Ambulation Distance (Feet) 230 Feet    Assistive device Straight cane   with 4 prong tip   Gait Pattern Step-through pattern    Ambulation Surface Level;Indoor      Knee/Hip Exercises: Standing   Forward Step Up Both;2 sets;5 reps;Hand Hold: 1;Step Height: 6"    Forward Step Up Limitations to simulate staircase at home to see if could be added to HEP (only holding on with one handrail - needing multimodal cues for only one handrail), cues for sequencing for step ups, pt reporting he would not be ready to perform at home at this time                  PT Education - 11/27/20 2039    Education Details reviewed and upgraded HEP    Person(s) Educated Patient;Spouse    Methods Explanation;Demonstration;Verbal cues;Handout    Comprehension Verbalized understanding;Returned demonstration            PT Short Term Goals - 10/24/20 1616      PT SHORT TERM GOAL #1   Title ALL STGS = LTGS             PT Long Term Goals - 11/27/20 2041      PT LONG TERM GOAL #1   Title Pt will be independent with final HEP in order to build upon functional gains made in therapy. ALL LTGS DUE 11/21/20    Baseline pt reports performing exercises most of the time at home, will benefit from ongoing additions    Time 4    Period Weeks    Status Achieved      PT LONG TERM GOAL #2   Title Pt will improve DGI to at least 16/24 in order to demo decr fall risk.    Baseline 14/24 on DGI on 10/24/20 - did not have time to perform on 11/27/20 due to time constraints    Time 4    Period Weeks    Status Deferred      PT LONG TERM GOAL #3   Title Pt will  improve gait speed with LRAD to at least  2.2 ft/sec in order to demo improved community mobility.    Baseline 1.89 seconds with SPC with quad tip; 1.79 ft/sec on 11/17/20    Time 4    Period Weeks    Status Not Met      PT LONG TERM GOAL #4   Title Pt will perform at least 10 sit <> stands during 30 escond chair stand with BUE support in order to demo improved BLE strength.    Baseline 30 second chair stand: 8 sit <> stands with BUE support from standard mat table, 7 sit <> stands on 11/17/20    Time 4    Period Weeks    Status Not Met      PT LONG TERM GOAL #5   Title Pt will ambulate at least 500' with supervision over indoor/outdoor surfaces with LRAD and perform a curb in order to demo improved community mobility.    Baseline with supervision/min guard with spc with 4 prong tip - did not perform on 11/27/20 due to cold weather.    Time 4    Period Weeks    Status Deferred           Revised/ongoing LTGs:  PT Long Term Goals - 11/27/20 2055      PT LONG TERM GOAL #1   Title Pt will be independent with final HEP in order to build upon functional gains made in therapy. ALL LTGS DUE 12/25/20    Baseline pt reports performing exercises most of the time at home, will benefit from ongoing additions    Time 4    Period Weeks    Status On-going    Target Date 12/25/20      PT LONG TERM GOAL #2   Title Pt will improve 3MWT distance by at least 50' in order to demo improved walking endurance.    Baseline 345' with SPC with quad tip    Time 4    Period Weeks    Status New      PT LONG TERM GOAL #3   Title Pt will improve gait speed with LRAD to at least 2.0 ft/sec in order to demo improved community mobility and derr fall risk.    Baseline 1.89 seconds with SPC with quad tip; 1.79 ft/sec on 11/17/20    Time 4    Period Weeks    Status Revised      PT LONG TERM GOAL #4   Title Pt will perform at least 9 sit <> stands during 30 escond chair stand with BUE support in order to demo  improved BLE strength.    Baseline 30 second chair stand: 8 sit <> stands with BUE support from standard mat table, 7 sit <> stands on 11/17/20    Time 4    Period Weeks    Status Revised      PT LONG TERM GOAL #5   Title Pt will ambulate at least 500' with supervision over indoor/outdoor surfaces with LRAD and perform a curb in order to demo improved community mobility.    Baseline with supervision/min guard with spc with 4 prong tip - did not perform on 11/27/20 due to cold weather.    Time 4    Period Weeks    Status On-going                Plan - 11/27/20 2051    Clinical Impression Statement Began to check remainder of LTGs for re-cert today. Unable to perform gait outdoors goal  today due to weather being colder. Also deferred DGI goal due to time constraints. Reviewed pt's HEP for strength/balance and made upgrades as appropriate. Will re-cert for an additional 2x week for 4 weeks to continue to focus on BLE strength, gait training, balance and endurance. LTGs revised/updated as appropriate.    Personal Factors and Comorbidities Comorbidity 3+;Past/Current Experience    Comorbidities L MCA CVA, Coronary Artery Disease, Heart Failure, Hyperlipidemia, Osteoarthritis, GERD, Renal Cell Cancer requiring nephrectomy (active)    Examination-Activity Limitations Locomotion Level;Stand;Transfers;Stairs    Examination-Participation Restrictions Community Activity;Occupation   work (is a Customer service manager)   [employer] Stable/Uncomplicated    Rehab Potential Good    PT Frequency 2x / week    PT Duration 4 weeks    PT Treatment/Interventions ADLs/Self Care Home Management;Stair training;Gait training;DME Instruction;Functional mobility training;Therapeutic activities;Therapeutic exercise;Balance training;Neuromuscular re-education;Patient/family education;Orthotic Fit/Training;Passive range of motion;Vestibular;Energy conservation    PT Next Visit Plan scifit, try leg press. step  ups continue teratment to improve LE strength and muscle mass while incorporating balance tasks    PT Home Exercise Plan added eccentric sitting to HEP, 5 reps per set    Consulted and Agree with Plan of Care Patient;Family member/caregiver    Family Member Consulted wife, [name]           Patient will benefit from skilled therapeutic intervention in order to improve the following deficits and impairments:  Abnormal gait,Decreased activity tolerance,Decreased balance,Decreased endurance,Decreased knowledge of use of DME,Decreased range of motion,Decreased strength,Difficulty walking,Decreased safety awareness  Visit Diagnosis: Unsteadiness on feet  Muscle weakness (generalized)  Other abnormalities of gait and mobility     Problem List Patient Active Problem List   Diagnosis Date Noted  . Nonischemic cardiomyopathy ([place]) 10/06/2020  . Cryptogenic stroke ([place]) 10/06/2020  . Acute on chronic combined systolic and diastolic CHF (congestive heart failure) ([place]) 07/17/2020  . Renal cell carcinoma ([place]) 07/17/2020  . Protein-calorie malnutrition, severe 07/08/2020  . Acute respiratory failure with hypoxia ([place]) 07/05/2020  . Iron deficiency anemia due to chronic blood loss 06/19/2020  . OSA (obstructive sleep apnea) 01/09/2018  . Pulmonary nodule, left 03/15/2017  . Former smoker 11/04/2014  . Insomnia 05/27/2014  . Plantar wart of left foot 11/27/2010  . Type 2 diabetes mellitus with other specified complication ([place]) 06/25/3566  . PSA, INCREASED 09/15/2009  . Diverticulitis of colon 05/08/2009  . Asthma, moderate persistent 06/19/2008  . ANKLE PAIN, CHRONIC 06/05/2007  . Allergic rhinitis 05/17/2007  . ERECTILE DYSFUNCTION, SECONDARY TO MEDICATION 05/17/2007  . Hyperlipidemia 04/06/2007  . Anxiety state 04/06/2007  . Essential hypertension 04/06/2007  . Coronary artery disease involving native coronary artery of native heart without angina pectoris 04/06/2007    Arliss Journey, PT, DPT  11/27/2020, 8:53 PM  [facility] [address] [place], [place], [address] Phone: [phone]   Fax:  [phone]  Name: INMAN FETTIG MRN: [id number] Date of Birth: 08/31/1955

## 2020-11-28 ENCOUNTER — Encounter: Payer: Self-pay | Admitting: Family Medicine

## 2020-11-28 ENCOUNTER — Other Ambulatory Visit: Payer: Self-pay

## 2020-11-28 ENCOUNTER — Ambulatory Visit: Payer: BC Managed Care – PPO

## 2020-11-28 DIAGNOSIS — R2681 Unsteadiness on feet: Secondary | ICD-10-CM | POA: Diagnosis not present

## 2020-11-28 DIAGNOSIS — I69854 Hemiplegia and hemiparesis following other cerebrovascular disease affecting left non-dominant side: Secondary | ICD-10-CM

## 2020-11-28 DIAGNOSIS — R41841 Cognitive communication deficit: Secondary | ICD-10-CM

## 2020-11-28 NOTE — Therapy (Signed)
[facility] [address] [place], [place], [address] Phone: [phone]   Fax:  [phone]  Speech Language Pathology Treatment  Patient Details  Name: Larry Garner MRN: [id number] Date of Birth: 04/18/1956 Referring Provider (SLP): Dana Lee Redmond, DNP   Encounter Date: 11/28/2020   End of Session - 11/28/20 1634    Visit Number 16    Number of Visits 17    Date for SLP Re-Evaluation 12/05/20    SLP Start Time 1448    SLP Stop Time  1530    SLP Time Calculation (min) 42 min    Activity Tolerance Patient tolerated treatment well           Past Medical History:  Diagnosis Date  . Allergic rhinitis   . Allergy   . Anxiety   . Asthma   . CHF (congestive heart failure) ([facility])   . Diabetes ([facility])   . Diverticulitis   . Dyspnea   . GERD (gastroesophageal reflux disease)    very occ  . History of heart attack   . HLD (hyperlipidemia)   . HTN (hypertension)   . Myocardial infarction ([facility]) 2000  . Sleep apnea    wears cpap   . Tubular adenoma of colon 09/2008    Past Surgical History:  Procedure Laterality Date  . CARDIAC CATHETERIZATION N/A 07/20/2016   Procedure: Left Heart Cath and Cors/Grafts Angiography;  Surgeon: Henry W Smith, MD;  Location: [facility];  Service: Cardiovascular;  Laterality: N/A;  . CATARACT EXTRACTION Bilateral 06/06/2019   Dr. Beavis. oct left, dec right 2020   . COLONOSCOPY    . CORONARY ANGIOPLASTY    . CORONARY ARTERY BYPASS GRAFT  06/1999  . POLYPECTOMY    . RENAL BIOPSY  06/2020  . TONSILLECTOMY     late50's early 60's    There were no vitals filed for this visit.   Subjective Assessment - 11/28/20 1408    Subjective "Oh golly we forgot to take it (new med) today."    Patient is accompained by: Family member   [name]   Currently in Pain? No/denies                 ADULT SLP TREATMENT - 11/28/20 1419      General Information   Behavior/Cognition  Alert;Cooperative;Pleasant mood      Treatment Provided   Treatment provided Cognitive-Linquistic      Cognitive-Linquistic Treatment   Treatment focused on Aphasia;Cognition    Skilled Treatment Pt's window for reading assists his reading comprehension and his ability to verbally say numbers presented numerically, instead of calling incorrect numbers. SLP worked with pt with his auditory comprehension in longer verbal stimuli. SLP req'd to provide mod-max A for details and SLP noted pt had to be cued to write details after his first 3 minute segment- SLP ?s pt's processing. Pt appears with reduced attention adn processing skills than two weeks ago when he listened to a 20 minute youTube video and took notes to give SLP summary. Notes for the first 10 minutes were just as good/detailed as notes for the last 8 minutes.      Assessment / Recommendations / Plan   Plan Continue with current plan of care      Progression Toward Goals   Progression toward goals --   pt attention and processing appear different/worse than two weeks ago -affected progress today           SLP   Education - 11/28/20 1633    Education Details pt's attention and processing appear worse today than they did two sessions ago prior to change in meds    Person(s) Educated Patient;Spouse    Methods Explanation    Comprehension Verbalized understanding            SLP Short Term Goals - 10/27/20 1148      SLP SHORT TERM GOAL #1   Title pt will name 8 items in abstract categories in 5/6 opportunities in 3 sessions    Status Partially Met      SLP [place] #2   Title pt will describe to SLP 3 anomia compensations    Status Deferred      SLP SHORT TERM GOAL #3   Title pt will verbally sequence pertinent routines for pt with modified independence in 3 sessions    Status Partially Met      SLP SHORT TERM GOAL #4   Title pt will participate functionally in 10 minutes mod complex conversation using  compensations for anomia successfully    Baseline 10-15-20    Status Achieved      SLP SHORT TERM GOAL #5   Title pt will participate in further testing as necessary (WAB-R or if pt desires, CLQT)    Period --   or 5 total sessions   Status Deferred            SLP Long Term Goals - 11/28/20 1420      SLP LONG TERM GOAL #1   Title pt will participate functionally in 20 minutes mod complex/complex conversation using compensations for anomia successfully in 3 sessions    Baseline 10-29-20, 11-03-20    Period --   or 17 total sessions, for all LTGs   Status Achieved      SLP LONG TERM GOAL #2   Title pt will express numbers with 100% success with self correction in 3 sessions    Baseline 11-03-20, 11-05-20    Status Achieved      SLP LONG TERM GOAL #3   Title pt will listen to a 20 minute discourse (lecture, TED talk, etc) and functionally give a verbal synopsis with compensations (notes, anomia compensations, etc) in 3 sessions    Baseline 11-03-20    Time 1    Period Weeks    Status On-going            Plan - 11/28/20 1634    Clinical Impression Statement Montel presents today with reported minimal episodes of word finding in mod-complex conversation. Pt progress with detail attention appears worse the last two sessions. Pt was able to give more-detailed summary of a youTube documentary of 20 minutes than 2 3-minute segments today - pt even stopped taking notes for his summary after 30 seconds into the second 3 minute segment due to decr'd attention skills. SLP believes pt would cont to benefit from skilled ST to target expressive aphasia and if pt desires, cognitive communication/attention skills. SLP ?s if pt should be discharged given his decr in attention skills and in processing speed.    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 Nonischemic cardiomyopathy ([place]) 10/06/2020  . Cryptogenic stroke ([place]) 10/06/2020  . Acute on chronic combined systolic and diastolic CHF (congestive heart failure) ([place]) 07/17/2020  . Renal cell carcinoma ([place]) 07/17/2020  . Protein-calorie malnutrition, severe 07/08/2020  . Acute respiratory failure with hypoxia ([place]) 07/05/2020  . Iron deficiency anemia due to chronic blood loss 06/19/2020  . OSA (obstructive sleep apnea) 01/09/2018  . Pulmonary nodule, left 03/15/2017  . Former smoker 11/04/2014  . Insomnia 05/27/2014  . Plantar wart of left foot 11/27/2010  . Type 2 diabetes mellitus with other specified complication ([place]) 73/53/2992  . PSA, INCREASED 09/15/2009  . Diverticulitis of colon 05/08/2009  . Asthma, moderate persistent 06/19/2008  . ANKLE PAIN, CHRONIC 06/05/2007  . Allergic rhinitis 05/17/2007  . ERECTILE DYSFUNCTION, SECONDARY TO MEDICATION 05/17/2007  . Hyperlipidemia 04/06/2007  . Anxiety state 04/06/2007  . Essential hypertension 04/06/2007  . Coronary artery disease involving native coronary artery of native heart without angina pectoris 04/06/2007    [facility] ,MS, CCC-SLP  11/28/2020, 4:37 PM  [facility] [address] [place], [place], [address] Phone: [phone]   Fax:  [phone]   Name: NESBIT MICHON MRN: [id number] Date of Birth: 1956-07-09

## 2020-12-01 ENCOUNTER — Other Ambulatory Visit: Payer: Self-pay

## 2020-12-01 ENCOUNTER — Ambulatory Visit: Payer: BC Managed Care – PPO | Admitting: Physical Therapy

## 2020-12-01 ENCOUNTER — Encounter: Payer: Self-pay | Admitting: Physical Therapy

## 2020-12-01 ENCOUNTER — Ambulatory Visit: Payer: BC Managed Care – PPO

## 2020-12-01 VITALS — BP 113/59 | HR 76

## 2020-12-01 DIAGNOSIS — M6281 Muscle weakness (generalized): Secondary | ICD-10-CM

## 2020-12-01 DIAGNOSIS — R2681 Unsteadiness on feet: Secondary | ICD-10-CM

## 2020-12-01 DIAGNOSIS — R2689 Other abnormalities of gait and mobility: Secondary | ICD-10-CM

## 2020-12-01 DIAGNOSIS — I69854 Hemiplegia and hemiparesis following other cerebrovascular disease affecting left non-dominant side: Secondary | ICD-10-CM

## 2020-12-01 MED ORDER — CONTOUR NEXT ONE KIT: PACK | 3 refills | Status: DC

## 2020-12-01 MED ORDER — GLUCOSE BLOOD VI STRP: ORAL_STRIP | 12 refills | Status: DC

## 2020-12-01 NOTE — Therapy (Signed)
[facility] [address] [place], [place], [address] Phone: [phone]   Fax:  [phone]  Physical Therapy Treatment  Patient Details  Name: Larry Garner MRN: [id number] Date of Birth: 03-05-1956 Referring Provider (PT): Redmond, Bea Laura, NP   Encounter Date: 12/01/2020   PT End of Session - 12/01/20 1217    Visit Number 26    Number of Visits 33    Date for PT Re-Evaluation 98/92/11   re-cert on 9/41/74   Authorization Type BCBS, Medicare    Progress Note Due on Visit 36    PT Start Time 1025   PT running late   PT Stop Time 1104    PT Time Calculation (min) 39 min    Equipment Utilized During Treatment Gait belt    Activity Tolerance Patient tolerated treatment well    Behavior During Therapy WFL for tasks assessed/performed           Past Medical History:  Diagnosis Date  . Allergic rhinitis   . Allergy   . Anxiety   . Asthma   . CHF (congestive heart failure) ([place])   . Diabetes ([place])   . Diverticulitis   . Dyspnea   . GERD (gastroesophageal reflux disease)    very occ  . History of heart attack   . HLD (hyperlipidemia)   . HTN (hypertension)   . Myocardial infarction ([place]) 2000  . Sleep apnea    wears cpap   . Tubular adenoma of colon 09/2008    Past Surgical History:  Procedure Laterality Date  . CARDIAC CATHETERIZATION N/A 07/20/2016   Procedure: Left Heart Cath and Cors/Grafts Angiography;  Surgeon: Belva Crome, MD;  Location: [facility];  Service: Cardiovascular;  Laterality: N/A;  . CATARACT EXTRACTION Bilateral 06/06/2019   Dr. Tommy Rainwater. oct left, dec right 2020   . COLONOSCOPY    . CORONARY ANGIOPLASTY    . CORONARY ARTERY BYPASS GRAFT  06/1999  . POLYPECTOMY    . RENAL BIOPSY  06/2020  . TONSILLECTOMY     late50's early 11's    Vitals:   12/01/20 1057  BP: (!) 113/59  Pulse: 76     Subjective Assessment - 12/01/20 1028    Subjective Went on a walk yesterday  at the [place] with his walker. Feeling good today.    Patient is accompained by: Family member   beth   Pertinent History L MCA CVA, Coronary Artery Disease, Heart Failure, Hyperlipidemia, Osteoarthritis, GERD, Renal Cell Cancer requiring nephrectomy (active)    Limitations Standing;Walking;Other (comment)   stairs   How long can you walk comfortably? approx. 100' with RW    Patient Stated Goals go up and down the stairs by himself, find out what his new normal will be, work on strength and flexbility.    Currently in Pain? No/denies                             [facility] Adult PT Treatment/Exercise - 12/01/20 1028      Transfers   Comments 10 sit <> stands from mat table with use of BUE support to stand and focus on eccentric control back to mat table without UE support      Ambulation/Gait   Ambulation/Gait Yes    Ambulation/Gait Assistance 5: Supervision    Ambulation/Gait Assistance Details pt has been switching using the cane between RUE/LUE and pt does not know/remember which UE  he should hold it in, practiced with cane in LUE with verbal ues from therapist for proper sequencing, pt has difficulty maintaining sequencing. when cane is used in RUE, pt able to maintain proper sequencing with LLE and pt appears more steady, discussed continued use of cane in RUE    Ambulation Distance (Feet) 200 Feet    Assistive device Straight cane   4 prong tip   Gait Pattern Step-through pattern    Ambulation Surface Level;Indoor      Exercises   Exercises Other Exercises    Other Exercises  2 x 10 reps mini squats with fingertip support on chair - used use on blue bolster as tactile cue on how far to squat down and just gently tap to bolster before standing back up. on blue air ex: x10 reps heel raises, x10 reps toe raises - cues for full knee extension when performing and full ROM      Knee/Hip Exercises: Aerobic   Stepper Scifit BLEs only for strengthening, ROM, activity  tolerance, 3.0 x6', O2 after 100%, HR 83 bpm      Knee/Hip Exercises: Standing   Forward Step Up Both;2 sets;5 reps;Hand Hold: 1;Step Height: 6"   performed 5 reps each leg   Forward Step Up Limitations alternating legs, cues for proper sequencing, incr difficulty with RLE                    PT Short Term Goals - 10/24/20 1616      PT SHORT TERM GOAL #1   Title ALL STGS = LTGS             PT Long Term Goals - 11/27/20 2055      PT LONG TERM GOAL #1   Title Pt will be independent with final HEP in order to build upon functional gains made in therapy. ALL LTGS DUE 12/25/20    Baseline pt reports performing exercises most of the time at home, will benefit from ongoing additions    Time 4    Period Weeks    Status On-going    Target Date 12/25/20      PT LONG TERM GOAL #2   Title Pt will improve 3MWT distance by at least 50' in order to demo improved walking endurance.    Baseline 345' with SPC with quad tip    Time 4    Period Weeks    Status New      PT LONG TERM GOAL #3   Title Pt will improve gait speed with LRAD to at least 2.0 ft/sec in order to demo improved community mobility and derr fall risk.    Baseline 1.89 seconds with SPC with quad tip; 1.79 ft/sec on 11/17/20    Time 4    Period Weeks    Status Revised      PT LONG TERM GOAL #4   Title Pt will perform at least 9 sit <> stands during 30 escond chair stand with BUE support in order to demo improved BLE strength.    Baseline 30 second chair stand: 8 sit <> stands with BUE support from standard mat table, 7 sit <> stands on 11/17/20    Time 4    Period Weeks    Status Revised      PT LONG TERM GOAL #5   Title Pt will ambulate at least 500' with supervision over indoor/outdoor surfaces with LRAD and perform a curb in order to demo improved community mobility.  Baseline with supervision/min guard with spc with 4 prong tip - did not perform on 11/27/20 due to cold weather.    Time 4    Period Weeks     Status On-going                 Plan - 12/01/20 1218    Clinical Impression Statement Today's skilled session focused on BLE strengthening, pt tolerated session well. Pt has been needing more cues for proper sequencing and technique of exercises in recent sessions. Assessed BP and WFL. Pt continues to use cane in both R/L hand and is unsure of what hand to use it in, pt more steady using cane in R hand and is able to properly sequence. When using cane in L hand pt tends to get off sequence frequently and is not as balanced. Will continue to progress towards LTGs.    Personal Factors and Comorbidities Comorbidity 3+;Past/Current Experience    Comorbidities L MCA CVA, Coronary Artery Disease, Heart Failure, Hyperlipidemia, Osteoarthritis, GERD, Renal Cell Cancer requiring nephrectomy (active)    Examination-Activity Limitations Locomotion Level;Stand;Transfers;Stairs    Examination-Participation Restrictions Community Activity;Occupation   work (is a Customer service manager)   [employer] Stable/Uncomplicated    Rehab Potential Good    PT Frequency 2x / week    PT Duration 4 weeks    PT Treatment/Interventions ADLs/Self Care Home Management;Stair training;Gait training;DME Instruction;Functional mobility training;Therapeutic activities;Therapeutic exercise;Balance training;Neuromuscular re-education;Patient/family education;Orthotic Fit/Training;Passive range of motion;Vestibular;Energy conservation    PT Next Visit Plan scifit, try leg press. step ups continue teratment to improve LE strength and muscle mass while incorporating balance tasks    PT Home Exercise Plan added eccentric sitting to HEP, 5 reps per set    Consulted and Agree with Plan of Care Patient;Family member/caregiver    Family Member Consulted wife, [name]           Patient will benefit from skilled therapeutic intervention in order to improve the following deficits and impairments:  Abnormal gait,Decreased  activity tolerance,Decreased balance,Decreased endurance,Decreased knowledge of use of DME,Decreased range of motion,Decreased strength,Difficulty walking,Decreased safety awareness  Visit Diagnosis: 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 Nonischemic cardiomyopathy ([place]) 10/06/2020  . Cryptogenic stroke ([place]) 10/06/2020  . Acute on chronic combined systolic and diastolic CHF (congestive heart failure) ([place]) 07/17/2020  . Renal cell carcinoma ([place]) 07/17/2020  . Protein-calorie malnutrition, severe 07/08/2020  . Acute respiratory failure with hypoxia ([place]) 07/05/2020  . Iron deficiency anemia due to chronic blood loss 06/19/2020  . OSA (obstructive sleep apnea) 01/09/2018  . Pulmonary nodule, left 03/15/2017  . Former smoker 11/04/2014  . Insomnia 05/27/2014  . Plantar wart of left foot 11/27/2010  . Type 2 diabetes mellitus with other specified complication ([place]) 32/95/1884  . PSA, INCREASED 09/15/2009  . Diverticulitis of colon 05/08/2009  . Asthma, moderate persistent 06/19/2008  . ANKLE PAIN, CHRONIC 06/05/2007  . Allergic rhinitis 05/17/2007  . ERECTILE DYSFUNCTION, SECONDARY TO MEDICATION 05/17/2007  . Hyperlipidemia 04/06/2007  . Anxiety state 04/06/2007  . Essential hypertension 04/06/2007  . Coronary artery disease involving native coronary artery of native heart without angina pectoris 04/06/2007    Arliss Journey, PT, DPT  12/01/2020, 12:25 PM  [facility] [address] [place], [place], [address]  Phone: [phone]   Fax:  [phone]  Name: Larry Garner MRN: [id number] Date of Birth: 03-01-1956

## 2020-12-03 ENCOUNTER — Ambulatory Visit: Payer: BC Managed Care – PPO | Admitting: Physical Therapy

## 2020-12-03 ENCOUNTER — Other Ambulatory Visit: Payer: Self-pay

## 2020-12-03 ENCOUNTER — Encounter: Payer: Self-pay | Admitting: Physical Therapy

## 2020-12-03 DIAGNOSIS — R2681 Unsteadiness on feet: Secondary | ICD-10-CM | POA: Diagnosis not present

## 2020-12-03 DIAGNOSIS — M6281 Muscle weakness (generalized): Secondary | ICD-10-CM

## 2020-12-03 DIAGNOSIS — R2689 Other abnormalities of gait and mobility: Secondary | ICD-10-CM

## 2020-12-03 DIAGNOSIS — I69854 Hemiplegia and hemiparesis following other cerebrovascular disease affecting left non-dominant side: Secondary | ICD-10-CM

## 2020-12-03 NOTE — Therapy (Signed)
[facility] [address] [place], [place], [address] Phone: [phone]   Fax:  [phone]  Physical Therapy Treatment  Patient Details  Name: Larry Garner MRN: [id number] Date of Birth: [birth year] Referring Provider (PT): Redmond, Bea Laura, NP   Encounter Date: 12/03/2020   PT End of Session - 12/03/20 1204    Visit Number 27    Number of Visits 33    Date for PT Re-Evaluation 02/77/41   re-cert on 2/87/86   Authorization Type BCBS, Medicare    Progress Note Due on Visit 30    PT Start Time 1018    PT Stop Time 1100   5 minutes unbillable due to pt using restroom   PT Time Calculation (min) 42 min    Equipment Utilized During Treatment Gait belt    Activity Tolerance Patient tolerated treatment well    Behavior During Therapy WFL for tasks assessed/performed           Past Medical History:  Diagnosis Date  . Allergic rhinitis   . Allergy   . Anxiety   . Asthma   . CHF (congestive heart failure) ([place])   . Diabetes ([place])   . Diverticulitis   . Dyspnea   . GERD (gastroesophageal reflux disease)    very occ  . History of heart attack   . HLD (hyperlipidemia)   . HTN (hypertension)   . Myocardial infarction ([place]) 2000  . Sleep apnea    wears cpap   . Tubular adenoma of colon 09/2008    Past Surgical History:  Procedure Laterality Date  . CARDIAC CATHETERIZATION N/A 07/20/2016   Procedure: Left Heart Cath and Cors/Grafts Angiography;  Surgeon: Belva Crome, MD;  Location: [facility];  Service: Cardiovascular;  Laterality: N/A;  . CATARACT EXTRACTION Bilateral 06/06/2019   Dr. Tommy Rainwater. oct left, dec right 2020   . COLONOSCOPY    . CORONARY ANGIOPLASTY    . CORONARY ARTERY BYPASS GRAFT  06/1999  . POLYPECTOMY    . RENAL BIOPSY  06/2020  . TONSILLECTOMY     late50's early 60's    There were no vitals filed for this visit.   Subjective Assessment - 12/03/20 1021    Subjective Did a lot  of walking yesterday to his oncologist appt. Had immunotherapy. Feeling ok today.    Patient is accompained by: Family member   beth   Pertinent History L MCA CVA, Coronary Artery Disease, Heart Failure, Hyperlipidemia, Osteoarthritis, GERD, Renal Cell Cancer requiring nephrectomy (active)    Limitations Standing;Walking;Other (comment)   stairs   How long can you walk comfortably? approx. 100' with RW    Patient Stated Goals go up and down the stairs by himself, find out what his new normal will be, work on strength and flexbility.    Currently in Pain? No/denies                             [facility] Adult PT Treatment/Exercise - 12/03/20 1022      Transfers   Transfers Sit to Stand    Sit to Stand 5: Supervision;With upper extremity assist    Stand to Sit 5: Supervision;With upper extremity assist    Comments 10 sit <> stands from mat table with use of BUE support to stand and focus on eccentric control back to mat table without UE support; use of blue air ex  Knee/Hip Exercises: Aerobic   Stepper --      Knee/Hip Exercises: Machines for Strengthening   Cybex Leg Press 40# with BLE 2 x 10 reps, cues for slowed and controlled through full ROM, attempted 50# at first, but too heavy      Knee/Hip Exercises: Standing   Heel Raises Right;Left;1 set;10 reps    Heel Raises Limitations with BUE support, pt unable to reach full ROM               Balance Exercises - 12/03/20 0001      Balance Exercises: Standing   SLS with Vectors Upper extremity assist 2;Intermittent upper extremity assist;Solid surface;Foam/compliant surface    SLS with Vectors Limitations on solid ground to 6" step alternating foot taps for SLS - x15 reps B, UE support > fingertip > none with cues for glute activation during stance phase, then on blue air ex alternating taps to colorful floor bubbles x15 reps B, with intermittent taps to bars for balance and UE support    Other Standing Exercises  on blue air ex: lateral step ups x12 reps B with no UE support, min guard at times for balance               PT Short Term Goals - 10/24/20 1616      PT SHORT TERM GOAL #1   Title ALL STGS = LTGS             PT Long Term Goals - 11/27/20 2055      PT LONG TERM GOAL #1   Title Pt will be independent with final HEP in order to build upon functional gains made in therapy. ALL LTGS DUE 12/25/20    Baseline pt reports performing exercises most of the time at home, will benefit from ongoing additions    Time 4    Period Weeks    Status On-going    Target Date 12/25/20      PT LONG TERM GOAL #2   Title Pt will improve 3MWT distance by at least 50' in order to demo improved walking endurance.    Baseline 345' with SPC with quad tip    Time 4    Period Weeks    Status New      PT LONG TERM GOAL #3   Title Pt will improve gait speed with LRAD to at least 2.0 ft/sec in order to demo improved community mobility and derr fall risk.    Baseline 1.89 seconds with SPC with quad tip; 1.79 ft/sec on 11/17/20    Time 4    Period Weeks    Status Revised      PT LONG TERM GOAL #4   Title Pt will perform at least 9 sit <> stands during 30 escond chair stand with BUE support in order to demo improved BLE strength.    Baseline 30 second chair stand: 8 sit <> stands with BUE support from standard mat table, 7 sit <> stands on 11/17/20    Time 4    Period Weeks    Status Revised      PT LONG TERM GOAL #5   Title Pt will ambulate at least 500' with supervision over indoor/outdoor surfaces with LRAD and perform a curb in order to demo improved community mobility.    Baseline with supervision/min guard with spc with 4 prong tip - did not perform on 11/27/20 due to cold weather.    Time 4    Period Weeks  Status On-going                 Plan - 12/03/20 1205    Clinical Impression Statement Today's skilled session continued to focus on BLE strengthening and standing balance  strategies on compliant surfaces and SLS activities with decr UE support. On level ground, with incr reps pt able to perform 6" step taps with no UE support - does have tendency to lose his balance more to the R and needing UE support. Will continue to progress towards LTGs.    Personal Factors and Comorbidities Comorbidity 3+;Past/Current Experience    Comorbidities L MCA CVA, Coronary Artery Disease, Heart Failure, Hyperlipidemia, Osteoarthritis, GERD, Renal Cell Cancer requiring nephrectomy (active)    Examination-Activity Limitations Locomotion Level;Stand;Transfers;Stairs    Examination-Participation Restrictions Community Activity;Occupation   work (is a Customer service manager)   [employer] Stable/Uncomplicated    Rehab Potential Good    PT Frequency 2x / week    PT Duration 4 weeks    PT Treatment/Interventions ADLs/Self Care Home Management;Stair training;Gait training;DME Instruction;Functional mobility training;Therapeutic activities;Therapeutic exercise;Balance training;Neuromuscular re-education;Patient/family education;Orthotic Fit/Training;Passive range of motion;Vestibular;Energy conservation    PT Next Visit Plan scifit, continue leg press. step ups, functional BLE strength - sit to stands, balance strategies - SLS and compliant surfaces    PT Home Exercise Plan added eccentric sitting to HEP, 5 reps per set    Consulted and Agree with Plan of Care Patient;Family member/caregiver    Family Member Consulted wife, [name]           Patient will benefit from skilled therapeutic intervention in order to improve the following deficits and impairments:  Abnormal gait,Decreased activity tolerance,Decreased balance,Decreased endurance,Decreased knowledge of use of DME,Decreased range of motion,Decreased strength,Difficulty walking,Decreased safety awareness  Visit Diagnosis: 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 Nonischemic cardiomyopathy ([place]) 10/06/2020  . Cryptogenic stroke ([place]) 10/06/2020  . Acute on chronic combined systolic and diastolic CHF (congestive heart failure) ([place]) 07/17/2020  . Renal cell carcinoma ([facility]) 07/17/2020  . Protein-calorie malnutrition, severe 07/08/2020  . Acute respiratory failure with hypoxia ([place]) 07/05/2020  . Iron deficiency anemia due to chronic blood loss 06/19/2020  . OSA (obstructive sleep apnea) 01/09/2018  . Pulmonary nodule, left 03/15/2017  . Former smoker 11/04/2014  . Insomnia 05/27/2014  . Plantar wart of left foot 11/27/2010  . Type 2 diabetes mellitus with other specified complication ([place]) 62/37/6283  . PSA, INCREASED 09/15/2009  . Diverticulitis of colon 05/08/2009  . Asthma, moderate persistent 06/19/2008  . ANKLE PAIN, CHRONIC 06/05/2007  . Allergic rhinitis 05/17/2007  . ERECTILE DYSFUNCTION, SECONDARY TO MEDICATION 05/17/2007  . Hyperlipidemia 04/06/2007  . Anxiety state 04/06/2007  . Essential hypertension 04/06/2007  . Coronary artery disease involving native coronary artery of native heart without angina pectoris 04/06/2007    Arliss Journey, PT, DPT  12/03/2020, 12:09 PM  [facility] [address] [place], [place], [address] Phone: [phone]   Fax:  [phone]  Name: Larry Garner MRN: [id number] Date of Birth: October 17, 1955

## 2020-12-08 ENCOUNTER — Encounter: Payer: Self-pay | Admitting: Physical Therapy

## 2020-12-08 ENCOUNTER — Ambulatory Visit: Payer: BC Managed Care – PPO | Attending: Family Medicine | Admitting: Physical Therapy

## 2020-12-08 ENCOUNTER — Other Ambulatory Visit: Payer: Self-pay

## 2020-12-08 DIAGNOSIS — M6281 Muscle weakness (generalized): Secondary | ICD-10-CM | POA: Insufficient documentation

## 2020-12-08 DIAGNOSIS — R41841 Cognitive communication deficit: Secondary | ICD-10-CM | POA: Diagnosis present

## 2020-12-08 DIAGNOSIS — R4701 Aphasia: Secondary | ICD-10-CM | POA: Diagnosis present

## 2020-12-08 DIAGNOSIS — R2689 Other abnormalities of gait and mobility: Secondary | ICD-10-CM | POA: Diagnosis present

## 2020-12-08 DIAGNOSIS — R2681 Unsteadiness on feet: Secondary | ICD-10-CM | POA: Diagnosis present

## 2020-12-08 DIAGNOSIS — I69854 Hemiplegia and hemiparesis following other cerebrovascular disease affecting left non-dominant side: Secondary | ICD-10-CM

## 2020-12-08 NOTE — Therapy (Signed)
[facility] [address] [place], [place], [address] Phone: [phone]   Fax:  [phone]  Physical Therapy Treatment  Patient Details  Name: Larry Garner MRN: [id number] Date of Birth: May 11, 1956 Referring Provider (PT): Redmond, Bea Laura, NP   Encounter Date: 12/08/2020   PT End of Session - 12/08/20 1634    Visit Number 28    Number of Visits 33    Date for PT Re-Evaluation 23/53/61   re-cert on 4/43/15   Authorization Type BCBS, Medicare    Progress Note Due on Visit 69    PT Start Time 1317    PT Stop Time 1359    PT Time Calculation (min) 42 min    Equipment Utilized During Treatment Gait belt    Activity Tolerance Patient tolerated treatment well    Behavior During Therapy WFL for tasks assessed/performed           Past Medical History:  Diagnosis Date  . Allergic rhinitis   . Allergy   . Anxiety   . Asthma   . CHF (congestive heart failure) ([place])   . Diabetes ([place])   . Diverticulitis   . Dyspnea   . GERD (gastroesophageal reflux disease)    very occ  . History of heart attack   . HLD (hyperlipidemia)   . HTN (hypertension)   . Myocardial infarction ([place]) 2000  . Sleep apnea    wears cpap   . Tubular adenoma of colon 09/2008    Past Surgical History:  Procedure Laterality Date  . CARDIAC CATHETERIZATION N/A 07/20/2016   Procedure: Left Heart Cath and Cors/Grafts Angiography;  Surgeon: Belva Crome, MD;  Location: [facility];  Service: Cardiovascular;  Laterality: N/A;  . CATARACT EXTRACTION Bilateral 06/06/2019   Dr. Tommy Rainwater. oct left, dec right 2020   . COLONOSCOPY    . CORONARY ANGIOPLASTY    . CORONARY ARTERY BYPASS GRAFT  06/1999  . POLYPECTOMY    . RENAL BIOPSY  06/2020  . TONSILLECTOMY     late50's early 60's    There were no vitals filed for this visit.   Subjective Assessment - 12/08/20 1320    Subjective Had a good weekend. No falls. Been a good week. Legs were  sore after last time.    Patient is accompained by: Family member   beth   Pertinent History L MCA CVA, Coronary Artery Disease, Heart Failure, Hyperlipidemia, Osteoarthritis, GERD, Renal Cell Cancer requiring nephrectomy (active)    Limitations Standing;Walking;Other (comment)   stairs   How long can you walk comfortably? approx. 100' with RW    Patient Stated Goals go up and down the stairs by himself, find out what his new normal will be, work on strength and flexbility.    Currently in Pain? No/denies                             [facility] Adult PT Treatment/Exercise - 12/08/20 1321      Ambulation/Gait   Ambulation/Gait Yes    Ambulation/Gait Assistance 5: Supervision    Ambulation/Gait Assistance Details outdoors over paved surfaces with cane, cues for foot clearance as pt with tendency to scuff feet    Ambulation Distance (Feet) 500 Feet    Assistive device Straight cane   with 4 prong tip   Gait Pattern Step-through pattern    Ambulation Surface Level;Indoor;Unlevel;Outdoor;Paved      Knee/Hip Exercises: Machines for  Strengthening   Cybex Leg Press 40# with BLE 2 x 10 reps, cues for slowed and controlled through full ROM,               Balance Exercises - 12/08/20 1348      Balance Exercises: Standing   SLS with Vectors Upper extremity assist 2;Intermittent upper extremity assist;Solid surface    SLS with Vectors Limitations on solid ground alternating toe taps to 4" foam beam (cues for gentle tap)  for SLS - x15 reps B, UE support > fingertip > none with cues for glute activation during stance phase    Rockerboard Anterior/posterior;EO    Rockerboard Limitations weight shifting x10 reps working on hip/ankle strategy, holding board still 2 x 30 seconds without UE support, working on keeping board still alternating UE lift x3 reps B - cues for core activation with intermittent min guard as needed.    Other Standing Exercises alternating forward stepping over  6" orange obstacle x10 reps B with BUE fingertip support, alternating lateral stepping over 2" beam x15 reps B, beginning with UE support and then none, incr difficulty performing with stance on RLE - min guard as needed.               PT Short Term Goals - 10/24/20 1616      PT SHORT TERM GOAL #1   Title ALL STGS = LTGS             PT Long Term Goals - 11/27/20 2055      PT LONG TERM GOAL #1   Title Pt will be independent with final HEP in order to build upon functional gains made in therapy. ALL LTGS DUE 12/25/20    Baseline pt reports performing exercises most of the time at home, will benefit from ongoing additions    Time 4    Period Weeks    Status On-going    Target Date 12/25/20      PT LONG TERM GOAL #2   Title Pt will improve 3MWT distance by at least 50' in order to demo improved walking endurance.    Baseline 345' with SPC with quad tip    Time 4    Period Weeks    Status New      PT LONG TERM GOAL #3   Title Pt will improve gait speed with LRAD to at least 2.0 ft/sec in order to demo improved community mobility and derr fall risk.    Baseline 1.89 seconds with SPC with quad tip; 1.79 ft/sec on 11/17/20    Time 4    Period Weeks    Status Revised      PT LONG TERM GOAL #4   Title Pt will perform at least 9 sit <> stands during 30 escond chair stand with BUE support in order to demo improved BLE strength.    Baseline 30 second chair stand: 8 sit <> stands with BUE support from standard mat table, 7 sit <> stands on 11/17/20    Time 4    Period Weeks    Status Revised      PT LONG TERM GOAL #5   Title Pt will ambulate at least 500' with supervision over indoor/outdoor surfaces with LRAD and perform a curb in order to demo improved community mobility.    Baseline with supervision/min guard with spc with 4 prong tip - did not perform on 11/27/20 due to cold weather.    Time 4    Period Weeks  Status On-going                 Plan - 12/08/20 1639     Clinical Impression Statement Today's skilled session focused on gait training outdoors with cane, BLE strengthening, and balance strategies. Worked on SLS activities on level ground with forward taps and lateral stepping. Pt with incr difficulty when performing SLS tasks with no UE support on RLE. Will continue to progess towards LTGs.    Personal Factors and Comorbidities Comorbidity 3+;Past/Current Experience    Comorbidities L MCA CVA, Coronary Artery Disease, Heart Failure, Hyperlipidemia, Osteoarthritis, GERD, Renal Cell Cancer requiring nephrectomy (active)    Examination-Activity Limitations Locomotion Level;Stand;Transfers;Stairs    Examination-Participation Restrictions Community Activity;Occupation   work (is a Customer service manager)   [employer] Stable/Uncomplicated    Rehab Potential Good    PT Frequency 2x / week    PT Duration 4 weeks    PT Treatment/Interventions ADLs/Self Care Home Management;Stair training;Gait training;DME Instruction;Functional mobility training;Therapeutic activities;Therapeutic exercise;Balance training;Neuromuscular re-education;Patient/family education;Orthotic Fit/Training;Passive range of motion;Vestibular;Energy conservation    PT Next Visit Plan scifit, continue leg press. step ups, functional BLE strength - sit to stands, balance strategies - SLS and compliant surfaces, keep doing rockerboard.    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 Nonischemic cardiomyopathy ([place]) 10/06/2020  . Cryptogenic stroke ([place]) 10/06/2020  . Acute on chronic combined systolic and diastolic CHF (congestive heart failure) ([place]) 07/17/2020  . Renal cell carcinoma ([place]) 07/17/2020  . Protein-calorie malnutrition, severe 07/08/2020  . Acute respiratory failure with hypoxia ([place]) 07/05/2020  . Iron deficiency anemia due to chronic blood loss 06/19/2020  . OSA (obstructive sleep apnea) 01/09/2018  . Pulmonary nodule, left 03/15/2017  . Former smoker 11/04/2014  . Insomnia 05/27/2014  . Plantar wart of left foot 11/27/2010  . Type 2 diabetes mellitus with other specified complication ([place]) 36/62/9476  . PSA, INCREASED 09/15/2009  . Diverticulitis of colon 05/08/2009  . Asthma, moderate persistent 06/19/2008  . ANKLE PAIN, CHRONIC 06/05/2007  . Allergic rhinitis 05/17/2007  . ERECTILE DYSFUNCTION, SECONDARY TO MEDICATION 05/17/2007  . Hyperlipidemia 04/06/2007  . Anxiety state 04/06/2007  . Essential hypertension 04/06/2007  . Coronary artery disease involving native coronary artery of native heart without angina pectoris 04/06/2007    Arliss Journey, PT, DPT  12/08/2020, 4:40 PM  [facility] [address] [place], [place], [address] Phone: [phone]   Fax:  [phone]  Name: Larry Garner MRN: [id number] Date of Birth: [birth year]

## 2020-12-10 ENCOUNTER — Ambulatory Visit (HOSPITAL_COMMUNITY): Payer: BC Managed Care – PPO | Attending: Cardiology

## 2020-12-10 ENCOUNTER — Encounter: Payer: Self-pay | Admitting: Physical Therapy

## 2020-12-10 ENCOUNTER — Ambulatory Visit: Payer: BC Managed Care – PPO | Admitting: Physical Therapy

## 2020-12-10 ENCOUNTER — Ambulatory Visit: Payer: BC Managed Care – PPO

## 2020-12-10 ENCOUNTER — Other Ambulatory Visit: Payer: Self-pay

## 2020-12-10 DIAGNOSIS — R2689 Other abnormalities of gait and mobility: Secondary | ICD-10-CM

## 2020-12-10 DIAGNOSIS — R2681 Unsteadiness on feet: Secondary | ICD-10-CM

## 2020-12-10 DIAGNOSIS — I428 Other cardiomyopathies: Secondary | ICD-10-CM | POA: Insufficient documentation

## 2020-12-10 DIAGNOSIS — R41841 Cognitive communication deficit: Secondary | ICD-10-CM

## 2020-12-10 DIAGNOSIS — I639 Cerebral infarction, unspecified: Secondary | ICD-10-CM | POA: Diagnosis present

## 2020-12-10 DIAGNOSIS — M6281 Muscle weakness (generalized): Secondary | ICD-10-CM

## 2020-12-10 DIAGNOSIS — R4701 Aphasia: Secondary | ICD-10-CM

## 2020-12-10 DIAGNOSIS — I69854 Hemiplegia and hemiparesis following other cerebrovascular disease affecting left non-dominant side: Secondary | ICD-10-CM | POA: Diagnosis not present

## 2020-12-10 LAB — ECHOCARDIOGRAM COMPLETE
Area-P 1/2: 6.27 cm2
S' Lateral: 4.5 cm

## 2020-12-10 NOTE — Therapy (Signed)
[facility] [address] [place], [place], [address] Phone: [phone]   Fax:  [phone]  Physical Therapy Treatment  Patient Details  Name: Larry Garner MRN: [id number] Date of Birth: 12/08/1955 Referring Provider (PT): Redmond, Bea Laura, NP   Encounter Date: 12/10/2020   PT End of Session - 12/10/20 1628    Visit Number 29    Number of Visits 33    Date for PT Re-Evaluation 0000000   re-cert on [id number]   Authorization Type BCBS, Medicare    Progress Note Due on Visit 74    PT Start Time 1534    PT Stop Time 1615    PT Time Calculation (min) 41 min    Equipment Utilized During Treatment Gait belt    Activity Tolerance Patient tolerated treatment well    Behavior During Therapy WFL for tasks assessed/performed           Past Medical History:  Diagnosis Date  . Allergic rhinitis   . Allergy   . Anxiety   . Asthma   . CHF (congestive heart failure) ([place])   . Diabetes ([place])   . Diverticulitis   . Dyspnea   . GERD (gastroesophageal reflux disease)    very occ  . History of heart attack   . HLD (hyperlipidemia)   . HTN (hypertension)   . Myocardial infarction ([place]) 2000  . Sleep apnea    wears cpap   . Tubular adenoma of colon 09/2008    Past Surgical History:  Procedure Laterality Date  . CARDIAC CATHETERIZATION N/A 07/20/2016   Procedure: Left Heart Cath and Cors/Grafts Angiography;  Surgeon: Belva Crome, MD;  Location: [facility];  Service: Cardiovascular;  Laterality: N/A;  . CATARACT EXTRACTION Bilateral 06/06/2019   Dr. Tommy Rainwater. oct left, dec right 2020   . COLONOSCOPY    . CORONARY ANGIOPLASTY    . CORONARY ARTERY BYPASS GRAFT  06/1999  . POLYPECTOMY    . RENAL BIOPSY  06/2020  . TONSILLECTOMY     late50's early 60's    There were no vitals filed for this visit.   Subjective Assessment - 12/10/20 1536    Subjective Had a fall, did not hit his head, does not remember  exactly how he fell. Was able to get up from the floor using the bed. Had a busy day earlier.    Patient is accompained by: Family member   Larry Garner   Pertinent History L MCA CVA, Coronary Artery Disease, Heart Failure, Hyperlipidemia, Osteoarthritis, GERD, Renal Cell Cancer requiring nephrectomy (active)    Limitations Standing;Walking;Other (comment)   stairs   How long can you walk comfortably? approx. 100' with RW    Patient Stated Goals go up and down the stairs by himself, find out what his new normal will be, work on strength and flexbility.    Currently in Pain? No/denies                             [facility] Adult PT Treatment/Exercise - 12/10/20 0001      Transfers   Transfers Sit to Stand    Comments 10 sit <> stands from elevated mat table without UE support      Therapeutic Activites    Therapeutic Activities Other Therapeutic Activities    Other Therapeutic Activities reviewed fall prevention education in the home (esp the bathroom) and provided handout; discussed importance of grab bars in  the bathroom and making sure bath rug is securely held to the floor. Also discussed making sure pt has his RW with him in the bathroom when taking a shower,esp at night when more fatigued and for extra balance.      Knee/Hip Exercises: Standing   Lateral Step Up Both;2 sets;5 reps;Hand Hold: 2;Step Height: 6"    Lateral Step Up Limitations pt needing a break between each 5 reps    Forward Step Up Both;Hand Hold: 1;Step Height: 6";1 set;10 reps    Forward Step Up Limitations at staircase x10 reps each leg               Balance Exercises - 12/10/20 0001      Balance Exercises: Standing   Standing Eyes Closed Wide (BOA);Foam/compliant surface    Standing Eyes Closed Limitations x30 seconds, 2 x 5 reps head turns, x5 reps head nods, close min guard for balance    Step Over Hurdles / Cones stepping over 4 smaller orange obstacles in // bars down and back x5 reps - cues for  incr foot clearance, pt needing fingertip support for safety    Other Standing Exercises standing on red mat; retro gait x4 reps - cues for step length, marching x4 reps with cues for incr foot clearance and ROM, tandem gait down and back x4 reps with use of UE fingertip support             PT Education - 12/10/20 1627    Education Details fall prevention education    Person(s) Educated Patient;Spouse    Methods Explanation;Handout    Comprehension Verbalized understanding            PT Short Term Goals - 10/24/20 1616      PT SHORT TERM GOAL #1   Title ALL STGS = LTGS             PT Long Term Goals - 11/27/20 2055      PT LONG TERM GOAL #1   Title Pt will be independent with final HEP in order to build upon functional gains made in therapy. ALL LTGS DUE 12/25/20    Baseline pt reports performing exercises most of the time at home, will benefit from ongoing additions    Time 4    Period Weeks    Status On-going    Target Date 12/25/20      PT LONG TERM GOAL #2   Title Pt will improve 3MWT distance by at least 50' in order to demo improved walking endurance.    Baseline 345' with SPC with quad tip    Time 4    Period Weeks    Status New      PT LONG TERM GOAL #3   Title Pt will improve gait speed with LRAD to at least 2.0 ft/sec in order to demo improved community mobility and derr fall risk.    Baseline 1.89 seconds with SPC with quad tip; 1.79 ft/sec on 11/17/20    Time 4    Period Weeks    Status Revised      PT LONG TERM GOAL #4   Title Pt will perform at least 9 sit <> stands during 30 escond chair stand with BUE support in order to demo improved BLE strength.    Baseline 30 second chair stand: 8 sit <> stands with BUE support from standard mat table, 7 sit <> stands on 11/17/20    Time 4    Period Weeks  Status Revised      PT LONG TERM GOAL #5   Title Pt will ambulate at least 500' with supervision over indoor/outdoor surfaces with LRAD and perform a  curb in order to demo improved community mobility.    Baseline with supervision/min guard with spc with 4 prong tip - did not perform on 11/27/20 due to cold weather.    Time 4    Period Weeks    Status On-going                 Plan - 12/10/20 1716    Clinical Impression Statement Reviewed fall prevention education at start of session with pt and pt's spouse as pt had a recent fall in the bathroom (pt did not injure himself and did not remember what happened). Remainder of session focused on BLE strengthening and standing balance strategies. Pt with incr difficulty performing SLS activities without UE suppot and incr postural sway with min guard for safety with balance on compliant surfaces with head motions. Will continue to progress towards LTGs.    Personal Factors and Comorbidities Comorbidity 3+;Past/Current Experience    Comorbidities L MCA CVA, Coronary Artery Disease, Heart Failure, Hyperlipidemia, Osteoarthritis, GERD, Renal Cell Cancer requiring nephrectomy (active)    Examination-Activity Limitations Locomotion Level;Stand;Transfers;Stairs    Examination-Participation Restrictions Community Activity;Occupation   work (is a Customer service manager)   [employer] Stable/Uncomplicated    Rehab Potential Good    PT Frequency 2x / week    PT Duration 4 weeks    PT Treatment/Interventions ADLs/Self Care Home Management;Stair training;Gait training;DME Instruction;Functional mobility training;Therapeutic activities;Therapeutic exercise;Balance training;Neuromuscular re-education;Patient/family education;Orthotic Fit/Training;Passive range of motion;Vestibular;Energy conservation    PT Next Visit Plan will need 10th visit PN. goals due next week -need to discuss plan going forward. scifit, continue leg press. step ups, functional BLE strength - sit to stands, balance strategies - SLS and compliant surfaces, keep doing rockerboard.    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 Nonischemic cardiomyopathy ([place]) 10/06/2020  . Cryptogenic stroke ([place]) 10/06/2020  . Acute on chronic combined systolic and diastolic CHF (congestive heart failure) ([place]) 07/17/2020  . Renal cell carcinoma ([place]) 07/17/2020  . Protein-calorie malnutrition, severe 07/08/2020  . Acute respiratory failure with hypoxia ([place]) 07/05/2020  . Iron deficiency anemia due to chronic blood loss 06/19/2020  . OSA (obstructive sleep apnea) 01/09/2018  . Pulmonary nodule, left 03/15/2017  . Former smoker 11/04/2014  . Insomnia 05/27/2014  . Plantar wart of left foot 11/27/2010  . Type 2 diabetes mellitus with other specified complication ([place]) 16/08/930  . PSA, INCREASED 09/15/2009  . Diverticulitis of colon 05/08/2009  . Asthma, moderate persistent 06/19/2008  . ANKLE PAIN, CHRONIC 06/05/2007  . Allergic rhinitis 05/17/2007  . ERECTILE DYSFUNCTION, SECONDARY TO MEDICATION 05/17/2007  . Hyperlipidemia 04/06/2007  . Anxiety  state 04/06/2007  . Essential hypertension 04/06/2007  . Coronary artery disease involving native coronary artery of native heart without angina pectoris 04/06/2007    Arliss Journey, PT, DPT  12/10/2020, 5:18  PM  [facility] [address] [place], [place], [address] Phone: [phone]   Fax:  [phone]  Name: DELOREAN KNUTZEN MRN: [id number] Date of Birth: [birth year]

## 2020-12-10 NOTE — Patient Instructions (Signed)
Fall Prevention in the Home, Adult Falls can cause injuries and can happen to people of all ages. There are many things you can do to make your home safe and to help prevent falls. Ask for help when making these changes. What actions can I take to prevent falls? General Instructions  Use good lighting in all rooms. Replace any light bulbs that burn out.  Turn on the lights in dark areas. Use night-lights.  Keep items that you use often in easy-to-reach places. Lower the shelves around your home if needed.  Set up your furniture so you have a clear path. Avoid moving your furniture around.  Do not have throw rugs or other things on the floor that can make you trip.  Avoid walking on wet floors.  If any of your floors are uneven, fix them.  Add color or contrast paint or tape to clearly mark and help you see: ? Grab bars or handrails. ? First and last steps of staircases. ? Where the edge of each step is.  If you use a stepladder: ? Make sure that it is fully opened. Do not climb a closed stepladder. ? Make sure the sides of the stepladder are locked in place. ? Ask someone to hold the stepladder while you use it.  Know where your pets are when moving through your home. What can I do in the bathroom?  Keep the floor dry. Clean up any water on the floor right away.  Remove soap buildup in the tub or shower.  Use nonskid mats or decals on the floor of the tub or shower.  Attach bath mats securely with double-sided, nonslip rug tape.  If you need to sit down in the shower, use a plastic, nonslip stool.  Install grab bars by the toilet and in the tub and shower. Do not use towel bars as grab bars.      What can I do in the bedroom?  Make sure that you have a light by your bed that is easy to reach.  Do not use any sheets or blankets for your bed that hang to the floor.  Have a firm chair with side arms that you can use for support when you get dressed. What can I do in  the kitchen?  Clean up any spills right away.  If you need to reach something above you, use a step stool with a grab bar.  Keep electrical cords out of the way.  Do not use floor polish or wax that makes floors slippery. What can I do with my stairs?  Do not leave any items on the stairs.  Make sure that you have a light switch at the top and the bottom of the stairs.  Make sure that there are handrails on both sides of the stairs. Fix handrails that are broken or loose.  Install nonslip stair treads on all your stairs.  Avoid having throw rugs at the top or bottom of the stairs.  Choose a carpet that does not hide the edge of the steps on the stairs.  Check carpeting to make sure that it is firmly attached to the stairs. Fix carpet that is loose or worn. What can I do on the outside of my home?  Use bright outdoor lighting.  Fix the edges of walkways and driveways and fix any cracks.  Remove anything that might make you trip as you walk through a door, such as a raised step or threshold.  Trim any   bushes or trees on paths to your home.  Check to see if handrails are loose or broken and that both sides of all steps have handrails.  Install guardrails along the edges of any raised decks and porches.  Clear paths of anything that can make you trip, such as tools or rocks.  Have leaves, snow, or ice cleared regularly.  Use sand or salt on paths during winter.  Clean up any spills in your garage right away. This includes grease or oil spills. What other actions can I take?  Wear shoes that: ? Have a low heel. Do not wear high heels. ? Have rubber bottoms. ? Feel good on your feet and fit well. ? Are closed at the toe. Do not wear open-toe sandals.  Use tools that help you move around if needed. These include: ? Canes. ? Walkers. ? Scooters. ? Crutches.  Review your medicines with your doctor. Some medicines can make you feel dizzy. This can increase your chance  of falling. Ask your doctor what else you can do to help prevent falls. Where to find more information  Centers for Disease Control and Prevention, [employer]: www.cdc.gov  National Institute on Aging: www.nia.nih.gov Contact a doctor if:  You are afraid of falling at home.  You feel weak, drowsy, or dizzy at home.  You fall at home. Summary  There are many simple things that you can do to make your home safe and to help prevent falls.  Ways to make your home safe include removing things that can make you trip and installing grab bars in the bathroom.  Ask for help when making these changes in your home. This information is not intended to replace advice given to you by your health care provider. Make sure you discuss any questions you have with your health care provider. Document Revised: 02/27/2020 Document Reviewed: 02/27/2020 Elsevier Patient Education  2021 [employer].  

## 2020-12-10 NOTE — Therapy (Signed)
[facility] [address] [place], [place], [address] Phone: [phone]   Fax:  [phone]  Speech Language Pathology Treatment  Patient Details  Name: Larry Garner MRN: [id number] Date of Birth: Oct 07, 1955 Referring Provider (SLP): Teresa Coombs, Nitro   Encounter Date: 12/10/2020   End of Session - 12/10/20 1614    Visit Number 17    Number of Visits 25    Date for SLP Re-Evaluation 03/10/21    SLP Start Time 1449    SLP Stop Time  1530    SLP Time Calculation (min) 41 min    Activity Tolerance Patient tolerated treatment well           Past Medical History:  Diagnosis Date  . Allergic rhinitis   . Allergy   . Anxiety   . Asthma   . CHF (congestive heart failure) ([place])   . Diabetes ([place])   . Diverticulitis   . Dyspnea   . GERD (gastroesophageal reflux disease)    very occ  . History of heart attack   . HLD (hyperlipidemia)   . HTN (hypertension)   . Myocardial infarction ([place]) 2000  . Sleep apnea    wears cpap   . Tubular adenoma of colon 09/2008    Past Surgical History:  Procedure Laterality Date  . CARDIAC CATHETERIZATION N/A 07/20/2016   Procedure: Left Heart Cath and Cors/Grafts Angiography;  Surgeon: Belva Crome, MD;  Location: [facility];  Service: Cardiovascular;  Laterality: N/A;  . CATARACT EXTRACTION Bilateral 06/06/2019   Dr. Tommy Rainwater. oct left, dec right 2020   . COLONOSCOPY    . CORONARY ANGIOPLASTY    . CORONARY ARTERY BYPASS GRAFT  06/1999  . POLYPECTOMY    . RENAL BIOPSY  06/2020  . TONSILLECTOMY     late50's early 60's    There were no vitals filed for this visit.   Subjective Assessment - 12/10/20 1457    Subjective "I just keep losing weight."    Patient is accompained by: Family member   [name] -wife   Currently in Pain? No/denies                 ADULT SLP TREATMENT - 12/10/20 1503      General Information   Behavior/Cognition  Alert;Cooperative;Pleasant mood      Treatment Provided   Treatment provided Cognitive-Linquistic      Cognitive-Linquistic Treatment   Treatment focused on Aphasia;Cognition    Skilled Treatment Pt only taking anti-itching med QD. Did not take today ([name] forgot). Pt with 2 word finding difficulties in first 10 minutes of ST today, adn two more during the session. Pt appeared to use circumlocution and was successful 2/4 instances. SLP discussed further ST with pt and wife and decided to cont ST following neuropsych eval review on 01/01/21. SLP had pt listen to a simple and entertaining youTube to work with pt's verbal expression.Pt req'd mod-max cues for appropriate and adequate summary of each 90 second interval, Pt did not choose to take notes for this task.      Assessment / Recommendations / Plan   Plan --   cont ST week of 01-05-21     Progression Toward Goals   Progression toward goals Progressing toward goals              SLP Short Term Goals - 10/27/20 1148      SLP SHORT TERM GOAL #1   Title pt will name  8 items in abstract categories in 5/6 opportunities in 3 sessions    Status Partially Met      SLP [place] #2   Title pt will describe to SLP 3 anomia compensations    Status Deferred      SLP SHORT TERM GOAL #3   Title pt will verbally sequence pertinent routines for pt with modified independence in 3 sessions    Status Partially Met      SLP SHORT TERM GOAL #4   Title pt will participate functionally in 10 minutes mod complex conversation using compensations for anomia successfully    Baseline 10-15-20    Status Achieved      SLP SHORT TERM GOAL #5   Title pt will participate in further testing as necessary (WAB-R or if pt desires, CLQT)    Period --   or 5 total sessions   Status Deferred            SLP Long Term Goals - 12/10/20 1623      SLP LONG TERM GOAL #1   Title pt will participate functionally in 20 minutes mod complex/complex conversation  using compensations for anomia successfully in 3 sessions    Baseline 10-29-20, 11-03-20    Period --   or 17 total sessions, for all LTGs   Status Achieved      SLP LONG TERM GOAL #2   Title pt will express numbers with 100% success with self correction in 3 sessions    Baseline 11-03-20, 11-05-20    Status Achieved      SLP LONG TERM GOAL #3   Title pt will listen to a 20 minute discourse (lecture, TED talk, etc) and functionally give a verbal synopsis with compensations (notes, anomia compensations, etc) in 3 sessions    Baseline 11-03-20    Time 4    Period Weeks   or 25 total visits for LTGs 3 and 4   Status On-going      SLP LONG TERM GOAL #4   Title pt will participate functionally in 20 minutes of mod complex/complex conversation using his compensations for anomia    Time 4    Period Weeks    Status New            Plan - 12/10/20 1615    Clinical Impression Statement [place] presents today with SLP-witnessed 4 episodes of word finding in mod-complex conversation. Pt w as unable to adequately provide verbal summary ot 90 second segments of a simple youTube video of interest. This appear worse than approx one month ago, and frequency of word finding errors/incidences have increased in the last month. SLP believes pt would cont to benefit from skilled ST to target  aphasia and if pt desires, cognitive communication/attention skills. Plan is to continue ST following neuropsych consult. Plan will incr back to x2/week ST.    Speech Therapy Frequency 2x / week    Duration 4 weeks   17   Treatment/Interventions Language facilitation;Cueing hierarchy;SLP instruction and feedback;Compensatory strategies;Patient/family education;Internal/external aids;Cognitive reorganization;Multimodal communcation approach;Functional tasks    Potential to Achieve Goals Good           Patient will benefit from skilled therapeutic intervention in order to improve the following deficits and impairments:    Aphasia  Cognitive communication deficit    Problem List Patient Active Problem List   Diagnosis Date Noted  . Nonischemic cardiomyopathy ([place]) 10/06/2020  . Cryptogenic stroke ([place]) 10/06/2020  . Acute on chronic combined systolic  and diastolic CHF (congestive heart failure) ([place]) 07/17/2020  . Renal cell carcinoma ([place]) 07/17/2020  . Protein-calorie malnutrition, severe 07/08/2020  . Acute respiratory failure with hypoxia ([place]) 07/05/2020  . Iron deficiency anemia due to chronic blood loss 06/19/2020  . OSA (obstructive sleep apnea) 01/09/2018  . Pulmonary nodule, left 03/15/2017  . Former smoker 11/04/2014  . Insomnia 05/27/2014  . Plantar wart of left foot 11/27/2010  . Type 2 diabetes mellitus with other specified complication ([place]) 05/01/3006  . PSA, INCREASED 09/15/2009  . Diverticulitis of colon 05/08/2009  . Asthma, moderate persistent 06/19/2008  . ANKLE PAIN, CHRONIC 06/05/2007  . Allergic rhinitis 05/17/2007  . ERECTILE DYSFUNCTION, SECONDARY TO MEDICATION 05/17/2007  . Hyperlipidemia 04/06/2007  . Anxiety state 04/06/2007  . Essential hypertension 04/06/2007  . Coronary artery disease involving native coronary artery of native heart without angina pectoris 04/06/2007    [facility], CCC-SLP  12/10/2020, 4:27 PM  [facility] [address] [place], [place], [address] Phone: [phone]   Fax:  [phone]   Name: Larry Garner MRN: [id number] Date of Birth: 1956-05-19

## 2020-12-11 NOTE — Patient Instructions (Addendum)
Diabetes reasonably well-controlled-I would like to keep A1c under 8.  We are going to reduce metformin to 500 mg twice daily.  I am fine with him holding glimepiride if blood sugar is 110 or below.  I am hoping reducing metformin will help some with appetite-really want to promote weight gain without raising A1c too high.  He will get a repeat A1c June 8 or later-we will schedule lab visit for this at the desk  Will also check b12  Please call [phone] to schedule a visit with [facility] behavioral health  Recommended follow up: Return in about 6 weeks (around 01/23/2021) for follow up- or sooner if needed. Can use same day slot

## 2020-12-11 NOTE — Progress Notes (Signed)
Phone [phone] In person visit   Subjective:   Larry Garner is a 65 y.o. year old very pleasant male patient who presents for/with See problem oriented charting Chief Complaint  Patient presents with  . Ozempic    This is a follow up to see how he's doing off of his ozempic.    This visit occurred during the SARS-CoV-2 public health emergency.  Safety protocols were in place, including screening questions prior to the visit, additional usage of staff PPE, and extensive cleaning of exam room while observing appropriate contact time as indicated for disinfecting solutions.   Past Medical History-  Patient Active Problem List   Diagnosis Date Noted  . Acute on chronic combined systolic and diastolic CHF (congestive heart failure) ([place]) 07/17/2020    Priority: High  . Type 2 diabetes mellitus with other specified complication ([place]) 75/17/0017    Priority: High  . Coronary artery disease involving native coronary artery of native heart without angina pectoris 04/06/2007    Priority: High  . OSA (obstructive sleep apnea) 01/09/2018    Priority: Medium  . Pulmonary nodule, left 03/15/2017    Priority: Medium  . Insomnia 05/27/2014    Priority: Medium  . Asthma, moderate persistent 06/19/2008    Priority: Medium  . ERECTILE DYSFUNCTION, SECONDARY TO MEDICATION 05/17/2007    Priority: Medium  . Hyperlipidemia 04/06/2007    Priority: Medium  . Anxiety state 04/06/2007    Priority: Medium  . Essential hypertension 04/06/2007    Priority: Medium  . Former smoker 11/04/2014    Priority: Low  . Plantar wart of left foot 11/27/2010    Priority: Low  . PSA, INCREASED 09/15/2009    Priority: Low  . Diverticulitis of colon 05/08/2009    Priority: Low  . ANKLE PAIN, CHRONIC 06/05/2007    Priority: Low  . Allergic rhinitis 05/17/2007    Priority: Low  . Nonischemic cardiomyopathy ([place]) 10/06/2020  . Cryptogenic stroke ([place]) 10/06/2020  . Renal cell carcinoma ([place]) 07/17/2020   . Protein-calorie malnutrition, severe 07/08/2020  . Acute respiratory failure with hypoxia ([place]) 07/05/2020  . Iron deficiency anemia due to chronic blood loss 06/19/2020    Medications- reviewed and updated Current Outpatient Medications  Medication Sig Dispense Refill  . albuterol (VENTOLIN HFA) 108 (90 Base) MCG/ACT inhaler Inhale 2 puffs into the lungs every 6 (six) hours as needed for wheezing or shortness of breath. 18 g 11  . ALPRAZolam (XANAX) 0.5 MG tablet Take 1 tablet (0.5 mg total) by mouth every 6 (six) hours as needed. 30 tablet 3  . apixaban (ELIQUIS) 5 MG TABS tablet Take 1 tablet (5 mg total) by mouth in the morning and at bedtime. 180 tablet 3  . bisoprolol (ZEBETA) 10 MG tablet TAKE 1 TABLET BY MOUTH EVERY DAY 90 tablet 3  . Blood Glucose Monitoring Suppl (CONTOUR NEXT ONE) KIT Use to test blood sugar daily. Dx: E11.9 1 kit 3  . cyanocobalamin 1000 MCG tablet Take 1,000 mcg by mouth daily.    [place] doxycycline (VIBRAMYCIN) 100 MG capsule Take 100 mg by mouth 2 (two) times daily.    . empagliflozin (JARDIANCE) 10 MG TABS tablet Take 1 tablet (10 mg total) by mouth daily before breakfast. 30 tablet 0  . famciclovir (FAMVIR) 500 MG tablet TAKE 3 TABLETS BY MOUTH EVERY DAY AS NEEDED AT FIRST SIGN OF OUTBREAK OF FEVER BLISTER 15 tablet 1  . furosemide (LASIX) 40 MG tablet Take 1 tablet (40 mg total) by mouth  daily. 90 tablet 3  . glimepiride (AMARYL) 2 MG tablet Take 1 tablet (2 mg total) by mouth daily before breakfast. 30 tablet 5  . glucose blood (BAYER CONTOUR NEXT TEST) test strip Use to test blood sugars daily. Dx: E11.9 100 each 12  . metFORMIN (GLUCOPHAGE) 500 MG tablet Take 1 tablet (500 mg total) by mouth 2 (two) times daily with a meal. No print 1 tablet 0  . Multiple Vitamin (MULTIVITAMIN) tablet Take 1 tablet by mouth daily.    . nitroGLYCERIN (NITROSTAT) 0.4 MG SL tablet PLACE 1 TABLET UNDER THE TONGUE EVERY 5 MINUTES AS NEEDED FOR CHEST PAIN. 25 tablet 1  .  oxymetazoline (AFRIN) 0.05 % nasal spray Place 1 spray into both nostrils 2 (two) times daily as needed for congestion.    . rosuvastatin (CRESTOR) 20 MG tablet TAKE 1 TABLET BY MOUTH DAILY 90 tablet 4  . sacubitril-valsartan (ENTRESTO) 49-51 MG Take 1 tablet by mouth 2 (two) times daily. 60 tablet 11  . spironolactone (ALDACTONE) 25 MG tablet Take 0.5 tablets (12.5 mg total) by mouth daily. 45 tablet 3  . triamcinolone cream (KENALOG) 0.1 % Apply 1 application topically 2 (two) times daily.    . zaleplon (SONATA) 10 MG capsule Take 1 capsule (10 mg total) by mouth at bedtime as needed. for sleep 30 capsule 5  . Azelastine-Fluticasone 137-50 MCG/ACT SUSP Place 1 spray into the nose daily as needed (allergies).  (Patient not taking: Reported on 12/12/2020)    . folic acid (FOLVITE) 010 MCG tablet Take 1,600 mcg by mouth daily. (Patient not taking: Reported on 12/12/2020)    . hydrOXYzine (ATARAX/VISTARIL) 25 MG tablet Take 25 mg by mouth 3 (three) times daily as needed. (Patient not taking: No sig reported)    . potassium chloride SA (KLOR-CON) 20 MEQ tablet Take 20 mEq by mouth once. (Patient not taking: Reported on 12/12/2020)     No current facility-administered medications for this visit.     Objective:  BP (!) 104/58   Pulse 80   Temp 98.3 F (36.8 C) (Temporal)   Ht '5\' 11"'  (1.803 m)   Wt 141 lb 9.6 oz (64.2 kg)   SpO2 97%   BMI 19.75 kg/m  Gen: NAD, resting comfortably CV: regular heart rate Lungs: CTAB no crackles, wheeze, rhonchi Ext: no edema Skin: warm, dry    Assessment and Plan   # Diabetes S: Medication:Jardiance 10 mg (also needed for CHF), metformin 1000 mg twice daily, glimepiride 2 mg (only gives this if over 111) -Patient stopped Ozempi on 11/15/20 c due to weight loss/poor appetite.  Compared to last visit with me he is down over 30 pounds since December. Weight has at least stabilized since being off of ozempic.  Wt Readings from Last 3 Encounters:  12/12/20 141 lb 9.6  oz (64.2 kg)  10/08/20 164 lb (74.4 kg)  10/06/20 165 lb (74.8 kg)   CBGs- morning #s range from 90-157. 157 was an outlier in last 11 days- all other #s 139 or below.  Lab Results  Component Value Date   HGBA1C 7.1 (H) 10/13/2020   HGBA1C 6.4 (H) 07/05/2020   HGBA1C 8.8 (H) 02/21/2020   A/P: Diabetes reasonably well-controlled-I would like to keep A1c under 8.  We are going to reduce metformin to 500 mg twice daily.  I am fine with him holding glimepiride if blood sugar is 110 or below.  I am hoping reducing metformin will help some with appetite-really want to promote  weight gain without raising A1c too high.  He will get a repeat A1c June 8 or later-we will schedule lab visit for this at the desk  #Incontinence- lasix 40 mg in AM between 8 and 8 30. Moreissues are in the evening though- more common after taking a nap in day. Started in last 1-2 months. No incontinence of stool.    #History of stroke late 2021- thought to be embolic- he remains on Eliquis.  Working with physical therapy for hemiplegia and speech therapy for aphasia - also doing trainer twice a week  #Renal cell carcinoma-has not been able to have surgery and has been on neoadjuvant Keytruda-fortunately has shown slow growth to date-surgery has been postponed due to debility and malnutrition after stroke  #nighttime confusion/ itching on keytruda- seemed to start after starting doxycycline for rash from Keytruda (gabapentin and hydroxyzine have not helped)- episodes waking up and couldn't tell wife daughters name that lives with them (after having 3 doxycycline), another time he stated had wood on back and needed to get off of his back (lotion helped), another episode was about icing being too thin- had been a cooking show . Also has neuropsychiatrist. Down to once a day on the doxycycline has not had another episode - does not seem like there eis a great other option here  #malnutrition- poor control. using [employer] drink  for extra 350 calories along with breakfast- big bowl of oatmeal. Also does milkshake during the day with extra calories. Doing whole milk.   #b12 deficiency- taking 1000 mcg a day. Hopefully improving- some memory issues- hoping improved Lab Results  Component Value Date   VITAMINB12 202 07/05/2020    Recommended follow up: Return in about 6 weeks (around 01/23/2021) for follow up- or sooner if needed. Future Appointments  Date Time Provider [place]  12/15/2020  1:15 PM Arliss Journey, [place] [facility]  12/16/2020 11:45 AM Evans Lance, MD [facility]  12/17/2020  1:15 PM Arliss Journey, PT [facility]  12/18/2020  8:00 AM Dina Rich M [facility]  12/25/2020  3:00 PM Edgardo Roys, PsyD [facility]  01/01/2021  4:00 PM Edgardo Roys, PsyD [facility] CPR  01/06/2021  1:15 PM Sharen Counter CCC-SLP [facility]  01/08/2021 11:45 AM Tommie Raymond, NP [facility]  01/09/2021 11:00 AM Sharen Counter, CCC-SLP [facility]  01/12/2021 11:00 AM Cori Razor [facility]  01/15/2021 10:15 AM Schinke, [place], CCC-SLP [facility]    Lab/Order associations:   ICD-10-CM   1. Type 2 diabetes mellitus with other specified complication, without long-term current use of insulin (HCC)  E11.69 Hemoglobin A1c  2. Severe protein-calorie malnutrition ([place])  E43   3. B12 deficiency  E53.8 Vitamin B12    Meds ordered this encounter  Medications  . metFORMIN (GLUCOPHAGE) 500 MG tablet    Sig: Take 1 tablet (500 mg total) by mouth 2 (two) times daily with a meal. No print    Dispense:  1 tablet    Refill:  0    Time Spent: 34 minutes of total time (11:00 AM- 11:34 AM) was spent on the date of the encounter performing the following actions: chart review prior to seeing the patient, obtaining history, performing a medically necessary exam, counseling on the treatment plan, placing orders, and documenting in our EHR.   Return  precautions advised.  [name], MD

## 2020-12-12 ENCOUNTER — Ambulatory Visit (INDEPENDENT_AMBULATORY_CARE_PROVIDER_SITE_OTHER): Payer: BC Managed Care – PPO | Admitting: Family Medicine

## 2020-12-12 ENCOUNTER — Encounter: Payer: Self-pay | Admitting: Family Medicine

## 2020-12-12 ENCOUNTER — Other Ambulatory Visit: Payer: Self-pay

## 2020-12-12 VITALS — BP 104/58 | HR 80 | Temp 98.3°F | Ht 71.0 in | Wt 141.6 lb

## 2020-12-12 DIAGNOSIS — E1169 Type 2 diabetes mellitus with other specified complication: Secondary | ICD-10-CM

## 2020-12-12 DIAGNOSIS — I255 Ischemic cardiomyopathy: Secondary | ICD-10-CM

## 2020-12-12 DIAGNOSIS — E43 Unspecified severe protein-calorie malnutrition: Secondary | ICD-10-CM

## 2020-12-12 DIAGNOSIS — E538 Deficiency of other specified B group vitamins: Secondary | ICD-10-CM | POA: Diagnosis not present

## 2020-12-12 LAB — HEMOGLOBIN A1C: Hgb A1c MFr Bld: 6.8 % — ABNORMAL HIGH (ref 4.6–6.5)

## 2020-12-12 LAB — VITAMIN B12: Vitamin B-12: 456 pg/mL (ref 211–911)

## 2020-12-12 MED ORDER — METFORMIN HCL 500 MG PO TABS
500.0000 mg | ORAL_TABLET | Freq: Two times a day (BID) | ORAL | 0 refills | Status: DC
Start: 2020-12-12 — End: 2021-03-15

## 2020-12-12 NOTE — Addendum Note (Signed)
Addended by: Doran Clay A on: 12/12/2020 11:39 AM   Modules accepted: Orders

## 2020-12-15 ENCOUNTER — Ambulatory Visit: Payer: BC Managed Care – PPO | Admitting: Physical Therapy

## 2020-12-15 ENCOUNTER — Other Ambulatory Visit: Payer: Self-pay

## 2020-12-15 ENCOUNTER — Encounter: Payer: Self-pay | Admitting: Physical Therapy

## 2020-12-15 DIAGNOSIS — R2689 Other abnormalities of gait and mobility: Secondary | ICD-10-CM

## 2020-12-15 DIAGNOSIS — R2681 Unsteadiness on feet: Secondary | ICD-10-CM

## 2020-12-15 DIAGNOSIS — I69854 Hemiplegia and hemiparesis following other cerebrovascular disease affecting left non-dominant side: Secondary | ICD-10-CM | POA: Diagnosis not present

## 2020-12-15 DIAGNOSIS — M6281 Muscle weakness (generalized): Secondary | ICD-10-CM

## 2020-12-15 NOTE — Therapy (Signed)
[facility] [address] [place], [place], [address] Phone: [phone]   Fax:  [phone]  Physical Therapy Treatment/10th Visit Progress Note  Patient Details  Name: Larry Garner MRN: [id number] Date of Birth: Dec 03, 1955 Referring Provider (PT): Redmond, Bea Laura, NP  10th Visit Physical Therapy Progress Note  Dates of Reporting Period: 11/05/20 to 12/15/20    Encounter Date: 12/15/2020   PT End of Session - 12/15/20 1637    Visit Number 30    Number of Visits 33    Date for PT Re-Evaluation 75/64/33   re-cert on 2/95/18   Authorization Type BCBS, Medicare    Progress Note Due on Visit 83    PT Start Time 1317    PT Stop Time 1359    PT Time Calculation (min) 42 min    Equipment Utilized During Treatment Gait belt    Activity Tolerance Patient tolerated treatment well    Behavior During Therapy [facility] for tasks assessed/performed           Past Medical History:  Diagnosis Date  . Allergic rhinitis   . Allergy   . Anxiety   . Asthma   . CHF (congestive heart failure) ([place])   . Diabetes ([place])   . Diverticulitis   . Dyspnea   . GERD (gastroesophageal reflux disease)    very occ  . History of heart attack   . HLD (hyperlipidemia)   . HTN (hypertension)   . Myocardial infarction ([place]) 2000  . Sleep apnea    wears cpap   . Tubular adenoma of colon 09/2008    Past Surgical History:  Procedure Laterality Date  . CARDIAC CATHETERIZATION N/A 07/20/2016   Procedure: Left Heart Cath and Cors/Grafts Angiography;  Surgeon: Belva Crome, MD;  Location: [facility];  Service: Cardiovascular;  Laterality: N/A;  . CATARACT EXTRACTION Bilateral 06/06/2019   Dr. Tommy Rainwater. oct left, dec right 2020   . COLONOSCOPY    . CORONARY ANGIOPLASTY    . CORONARY ARTERY BYPASS GRAFT  06/1999  . POLYPECTOMY    . RENAL BIOPSY  06/2020  . TONSILLECTOMY     late50's early 60's    There were no vitals filed for this  visit.   Subjective Assessment - 12/15/20 1320    Subjective No falls. Had a good weekend. Reports weight is feeling more stable. Working with a Clinical research associate a couple times of week for strengthening. Feels like he wants to warp up with PT at the end of this week    Patient is accompained by: Family member   beth   Pertinent History L MCA CVA, Coronary Artery Disease, Heart Failure, Hyperlipidemia, Osteoarthritis, GERD, Renal Cell Cancer requiring nephrectomy (active)    Limitations Standing;Walking;Other (comment)   stairs   How long can you walk comfortably? approx. 100' with RW    Patient Stated Goals go up and down the stairs by himself, find out what his new normal will be, work on strength and flexbility.    Currently in Pain? No/denies              [facility] PT Assessment - 12/15/20 1331      6 Minute Walk- Baseline   HR (bpm) 76    02 Sat (%RA) 99 %    Modified Borg Scale for Dyspnea 0- Nothing at all      6 Minute walk- Post Test   HR (bpm) 95    02 Sat (%RA) 99 %  Modified Borg Scale for Dyspnea 0- Nothing at all    Perceived Rate of Exertion (Borg) 11- Fairly light      6 minute walk test results    Endurance additional comments 3MWT = 350' with SPC with 4 prong tip                         [facility] Adult PT Treatment/Exercise - 12/15/20 1331      Transfers   Comments 30 second chair stand with BUE support from standard height chair: 7 reps 1st attempt, 7 reps 2nd attempt      Ambulation/Gait   Ambulation/Gait Yes    Ambulation/Gait Assistance 5: Supervision    Ambulation/Gait Assistance Details during 3MWT    Ambulation Distance (Feet) --   345   Assistive device Straight cane   with 4 prong tip   Gait Pattern Step-through pattern    Ambulation Surface Level;Indoor    Gait velocity 16.35 seconds = 2.0 ft/sec      Knee/Hip Exercises: Standing   Forward Step Up Both;Hand Hold: 1;Step Height: 6";2 sets;5 reps    Forward Step Up Limitations at staircase,  needing verbal and tactile cues on proper placement of UE on railing (to mimic pt's railing at home), incr difficulty when stepping up with RLE                  PT Education - 12/15/20 1636    Education Details results of goals - discussed with pt and pt's spouse pt having a plateau in progress at this time and will plan to D/C at next session with both in agreement. discussed continued importance of performing HEP after discharge for strength/balance as well as trying to incorporate incr activity throughout the day (such as using seated peddle bike during commercial breaks when watching TV), and trying to go on walks outdoors with wife using RW.    Person(s) Educated Patient;Spouse    Methods Explanation    Comprehension Verbalized understanding            PT Short Term Goals - 10/24/20 1616      PT SHORT TERM GOAL #1   Title ALL STGS = LTGS             PT Long Term Goals - 12/15/20 1331      PT LONG TERM GOAL #1   Title Pt will be independent with final HEP in order to build upon functional gains made in therapy. ALL LTGS DUE 12/25/20    Baseline pt reports performing exercises most of the time at home, will benefit from ongoing additions    Time 4    Period Weeks    Status On-going      PT LONG TERM GOAL #2   Title Pt will improve 3MWT distance by at least 50' in order to demo improved walking endurance.    Baseline 345-500' with [place] with quad tip on 12/15/20 (previously 345')    Time 4    Period Weeks    Status Not Met      PT LONG TERM GOAL #3   Title Pt will improve gait speed with LRAD to at least 2.0 ft/sec in order to demo improved community mobility and derr fall risk.    Baseline 1.89 seconds with SPC with quad tip; 1.79 ft/sec on 11/17/20, 2.00 ft/sec on 12/15/20    Time 4    Period Weeks    Status Achieved  PT LONG TERM GOAL #4   Title Pt will perform at least 9 sit <> stands during 30 escond chair stand with BUE support in order to demo improved BLE  strength.    Baseline 7 sit <> stands on 11/17/20, 7 sit <> stands on 12/15/20 with BUE support    Time 4    Period Weeks    Status Not Met      PT LONG TERM GOAL #5   Title Pt will ambulate at least 500' with supervision over indoor/outdoor surfaces with LRAD and perform a curb in order to demo improved community mobility.    Baseline with supervision/min guard with spc with 4 prong tip - did not perform on 11/27/20 due to cold weather.    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 Nonischemic cardiomyopathy ([place]) 10/06/2020  . Cryptogenic stroke ([place]) 10/06/2020  . Acute on chronic combined systolic and diastolic CHF (congestive heart failure) ([place]) 07/17/2020  . Renal cell carcinoma ([place]) 07/17/2020  . Protein-calorie malnutrition, severe 07/08/2020  . Acute respiratory failure with hypoxia ([place]) 07/05/2020  . Iron deficiency anemia due to chronic blood loss 06/19/2020  . OSA (obstructive sleep apnea) 01/09/2018  . Pulmonary nodule, left 03/15/2017  . Former smoker 11/04/2014  . Insomnia 05/27/2014  . Plantar wart of left foot 11/27/2010  . Type 2 diabetes mellitus with other specified complication ([place]) 26/33/3545  . PSA, INCREASED 09/15/2009  . Diverticulitis of colon 05/08/2009  . Asthma, moderate persistent 06/19/2008  . ANKLE PAIN, CHRONIC 06/05/2007  . Allergic rhinitis 05/17/2007  . ERECTILE DYSFUNCTION, SECONDARY TO MEDICATION 05/17/2007  . Hyperlipidemia 04/06/2007  . Anxiety state 04/06/2007  . Essential hypertension  04/06/2007  . Coronary artery disease involving native coronary artery of native heart without angina pectoris 04/06/2007    Arliss Journey, PT, DPT  12/15/2020, 4:39 PM  [facility] [address] [place], [place], [address] Phone: [phone]   Fax:  [phone]  Name: KAYMEN ADRIAN MRN: [id number] Date of Birth: Jan 23, 1956

## 2020-12-16 ENCOUNTER — Ambulatory Visit (INDEPENDENT_AMBULATORY_CARE_PROVIDER_SITE_OTHER): Payer: BC Managed Care – PPO | Admitting: Internal Medicine

## 2020-12-16 ENCOUNTER — Encounter: Payer: Self-pay | Admitting: Internal Medicine

## 2020-12-16 VITALS — BP 104/58 | HR 81 | Ht 71.0 in | Wt 141.6 lb

## 2020-12-16 DIAGNOSIS — I639 Cerebral infarction, unspecified: Secondary | ICD-10-CM | POA: Diagnosis not present

## 2020-12-16 DIAGNOSIS — I255 Ischemic cardiomyopathy: Secondary | ICD-10-CM

## 2020-12-16 NOTE — Progress Notes (Signed)
HPI Mr. Philbin returns today for followup. He is a pleasant 65 yo man with a h/o CA, stroke, LV dysfunction, and CAD, s/p CABG. His EF was 25% when I saw him 3 months ago. He has been GDMT and repeat EF is notable that his EF has improved to 40%. He has not had syncope. He continues to lose weight though his CA MD does not think that the weight loss is due to CA. He is down 60 lbs. He denies chest pain. His appetite is depressed.  Allergies  Allergen Reactions  . Morphine Sulfate Nausea And Vomiting and Swelling     Current Outpatient Medications  Medication Sig Dispense Refill  . albuterol (VENTOLIN HFA) 108 (90 Base) MCG/ACT inhaler Inhale 2 puffs into the lungs every 6 (six) hours as needed for wheezing or shortness of breath. 18 g 11  . ALPRAZolam (XANAX) 0.5 MG tablet Take 1 tablet (0.5 mg total) by mouth every 6 (six) hours as needed. 30 tablet 3  . apixaban (ELIQUIS) 5 MG TABS tablet Take 1 tablet (5 mg total) by mouth in the morning and at bedtime. 180 tablet 3  . Azelastine-Fluticasone 137-50 MCG/ACT SUSP Place 1 spray into the nose daily as needed (allergies).    . bisoprolol (ZEBETA) 10 MG tablet TAKE 1 TABLET BY MOUTH EVERY DAY 90 tablet 3  . Blood Glucose Monitoring Suppl (CONTOUR NEXT ONE) KIT Use to test blood sugar daily. Dx: E11.9 1 kit 3  . cyanocobalamin 1000 MCG tablet Take 1,000 mcg by mouth daily.    Mar[place] Kitchen doxycycline (VIBRAMYCIN) 100 MG capsule Take 100 mg by mouth 2 (two) times daily.    . empagliflozin (JARDIANCE) 10 MG TABS tablet Take 1 tablet (10 mg total) by mouth daily before breakfast. 30 tablet 0  . famciclovir (FAMVIR) 500 MG tablet TAKE 3 TABLETS BY MOUTH EVERY DAY AS NEEDED AT FIRST SIGN OF OUTBREAK OF FEVER BLISTER 15 tablet 1  . furosemide (LASIX) 40 MG tablet Take 1 tablet (40 mg total) by mouth daily. 90 tablet 3  . glimepiride (AMARYL) 2 MG tablet Take 1 tablet (2 mg total) by mouth daily before breakfast. 30 tablet 5  . glucose blood (BAYER CONTOUR  NEXT TEST) test strip Use to test blood sugars daily. Dx: E11.9 100 each 12  . hydrOXYzine (ATARAX/VISTARIL) 25 MG tablet Take 25 mg by mouth 3 (three) times daily as needed.    . metFORMIN (GLUCOPHAGE) 500 MG tablet Take 1 tablet (500 mg total) by mouth 2 (two) times daily with a meal. No print 1 tablet 0  . Multiple Vitamin (MULTIVITAMIN) tablet Take 1 tablet by mouth daily.    . nitroGLYCERIN (NITROSTAT) 0.4 MG SL tablet PLACE 1 TABLET UNDER THE TONGUE EVERY 5 MINUTES AS NEEDED FOR CHEST PAIN. 25 tablet 1  . oxymetazoline (AFRIN) 0.05 % nasal spray Place 1 spray into both nostrils 2 (two) times daily as needed for congestion.    . potassium chloride SA (KLOR-CON) 20 MEQ tablet Take 20 mEq by mouth once.    . rosuvastatin (CRESTOR) 20 MG tablet TAKE 1 TABLET BY MOUTH DAILY 90 tablet 4  . sacubitril-valsartan (ENTRESTO) 49-51 MG Take 1 tablet by mouth 2 (two) times daily. 60 tablet 11  . spironolactone (ALDACTONE) 25 MG tablet Take 0.5 tablets (12.5 mg total) by mouth daily. 45 tablet 3  . triamcinolone cream (KENALOG) 0.1 % Apply 1 application topically 2 (two) times daily.    . zaleplon (SONATA)  10 MG capsule Take 1 capsule (10 mg total) by mouth at bedtime as needed. for sleep 30 capsule 5   No current facility-administered medications for this visit.     Past Medical History:  Diagnosis Date  . Allergic rhinitis   . Allergy   . Anxiety   . Asthma   . CHF (congestive heart failure) ([place])   . Diabetes ([place])   . Diverticulitis   . Dyspnea   . GERD (gastroesophageal reflux disease)    very occ  . History of heart attack   . HLD (hyperlipidemia)   . HTN (hypertension)   . Myocardial infarction ([place]) 2000  . Sleep apnea    wears cpap   . Tubular adenoma of colon 09/2008    ROS:   All systems reviewed and negative except as noted in the HPI.   Past Surgical History:  Procedure Laterality Date  . CARDIAC CATHETERIZATION N/A 07/20/2016   Procedure: Left Heart Cath and  Cors/Grafts Angiography;  Surgeon: Belva Crome, MD;  Location: [facility];  Service: Cardiovascular;  Laterality: N/A;  . CATARACT EXTRACTION Bilateral 06/06/2019   Dr. Tommy Rainwater. oct left, dec right 2020   . COLONOSCOPY    . CORONARY ANGIOPLASTY    . CORONARY ARTERY BYPASS GRAFT  06/1999  . POLYPECTOMY    . RENAL BIOPSY  06/2020  . TONSILLECTOMY     late50's early 80's     Family History  Problem Relation Age of Onset  . Hypertension Mother   . Alzheimer's disease Mother   . Colon polyps Mother   . Hyperlipidemia Father   . Hypertension Father   . Colon polyps Father   . Colon cancer Paternal Aunt   . Pancreatic cancer Neg Hx   . Stomach cancer Neg Hx   . Thyroid disease Neg Hx   . Esophageal cancer Neg Hx   . Rectal cancer Neg Hx      Social History   Socioeconomic History  . Marital status: Married    Spouse name: Not on file  . Number of children: Not on file  . Years of education: Not on file  . Highest education level: Not on file  Occupational History  . Occupation: Banker  Tobacco Use  . Smoking status: Former Smoker    Packs/day: 0.50    Years: 4.00    Pack years: 2.00    Types: Cigarettes    Quit date: 09/29/1977    Years since quitting: 43.2  . Smokeless tobacco: Never Used  . Tobacco comment: quit on 1980  Vaping Use  . Vaping Use: Never used  Substance and Sexual Activity  . Alcohol use: Not Currently  . Drug use: No  . Sexual activity: Yes  Other Topics Concern  . Not on file  Social History Narrative   Married  In 1981 with 2 kids (son and daughter). No grandkids.       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[employer])      Hobbies: active in church, sings for church, travel   Social Determinants of Radio broadcast assistant Strain: Not on [employer] Insecurity: Not on file  Transportation Needs: Not on file  Physical Activity: Not on file  Stress: Not on file  Social Connections: Not on file  Intimate Partner Violence: Not on file      BP (!) 104/58   Pulse 81   Ht '5\' 11"'  (1.803 m)   Wt 141 lb 9.6 oz (64.2 kg)  SpO2 98%   BMI 19.75 kg/m   Physical Exam:  Well appearing NAD HEENT: Unremarkable Neck:  No JVD, no thyromegally Lymphatics:  No adenopathy Back:  No CVA tenderness Lungs:  Clear with no wheezes HEART:  Regular rate rhythm, no murmurs, no rubs, no clicks Abd:  soft, positive bowel sounds, no organomegally, no rebound, no guarding Ext:  2 plus pulses, no edema, no cyanosis, no clubbing Skin:  No rashes no nodules Neuro:  CN II through XII intact, motor grossly intact  EKG - nsr with RBBB  DEVICE  Normal device function.  See PaceArt for details.   Assess/Plan: 1. Chronic systolic heart failure - his EF has improved. He is no longer a candidate for a primary prevention ICD. I have recommended he continue GDMT.  2. Weight loss - we spent time discussing his diet. I strongly encouraged him to eat more fatty foods. He has lost 20 lbs since I saw him last.  3. CAD - he denies anginal symptoms.   Carleene Overlie Abisola Carrero,MD

## 2020-12-16 NOTE — Patient Instructions (Addendum)
Medication Instructions:  Your physician recommends that you continue on your current medications as directed. Please refer to the Current Medication list given to you today.  Labwork: None ordered.  Testing/Procedures: None ordered.  Follow-Up: Your physician wants you to follow-up in: as needed with Gregg Taylor, MD    Any Other Special Instructions Will Be Listed Below (If Applicable).  If you need a refill on your cardiac medications before your next appointment, please call your pharmacy.       

## 2020-12-17 ENCOUNTER — Encounter: Payer: Self-pay | Admitting: Physical Therapy

## 2020-12-17 ENCOUNTER — Ambulatory Visit: Payer: BC Managed Care – PPO | Admitting: Physical Therapy

## 2020-12-17 ENCOUNTER — Other Ambulatory Visit: Payer: Self-pay

## 2020-12-17 DIAGNOSIS — I69854 Hemiplegia and hemiparesis following other cerebrovascular disease affecting left non-dominant side: Secondary | ICD-10-CM | POA: Diagnosis not present

## 2020-12-17 DIAGNOSIS — R2689 Other abnormalities of gait and mobility: Secondary | ICD-10-CM

## 2020-12-17 DIAGNOSIS — M6281 Muscle weakness (generalized): Secondary | ICD-10-CM

## 2020-12-17 DIAGNOSIS — R2681 Unsteadiness on feet: Secondary | ICD-10-CM

## 2020-12-17 NOTE — Therapy (Signed)
[facility] [address] [place], [place], [address] Phone: [phone]   Fax:  [phone]  Physical Therapy Treatment/Discharge Summary  Patient Details  Name: Larry Garner MRN: [id number] Date of Birth: [birth year] Referring Provider (PT): Redmond, Bea Laura, NP   Encounter Date: 12/17/2020   PT End of Session - 12/17/20 1627    Visit Number 31    Number of Visits 33    Date for PT Re-Evaluation 23/34/35   re-cert on 6/86/16   Authorization Type BCBS, Medicare    Progress Note Due on Visit 55    PT Start Time 1318    PT Stop Time 1358    PT Time Calculation (min) 40 min    Activity Tolerance Patient tolerated treatment well    Behavior During Therapy [facility] for tasks assessed/performed           Past Medical History:  Diagnosis Date  . Allergic rhinitis   . Allergy   . Anxiety   . Asthma   . CHF (congestive heart failure) ([place])   . Diabetes ([place])   . Diverticulitis   . Dyspnea   . GERD (gastroesophageal reflux disease)    very occ  . History of heart attack   . HLD (hyperlipidemia)   . HTN (hypertension)   . Myocardial infarction ([place]) 2000  . Sleep apnea    wears cpap   . Tubular adenoma of colon 09/2008    Past Surgical History:  Procedure Laterality Date  . CARDIAC CATHETERIZATION N/A 07/20/2016   Procedure: Left Heart Cath and Cors/Grafts Angiography;  Surgeon: Belva Crome, MD;  Location: [facility];  Service: Cardiovascular;  Laterality: N/A;  . CATARACT EXTRACTION Bilateral 06/06/2019   Dr. Tommy Rainwater. oct left, dec right 2020   . COLONOSCOPY    . CORONARY ANGIOPLASTY    . CORONARY ARTERY BYPASS GRAFT  06/1999  . POLYPECTOMY    . RENAL BIOPSY  06/2020  . TONSILLECTOMY     late50's early 60's    There were no vitals filed for this visit.   Subjective Assessment - 12/17/20 1321    Subjective No falls, no changes. Saw the cardiologist yesterday and it went well.    Patient is  accompained by: Family member   beth   Pertinent History L MCA CVA, Coronary Artery Disease, Heart Failure, Hyperlipidemia, Osteoarthritis, GERD, Renal Cell Cancer requiring nephrectomy (active)    Limitations Standing;Walking;Other (comment)   stairs   How long can you walk comfortably? approx. 100' with RW    Patient Stated Goals go up and down the stairs by himself, find out what his new normal will be, work on strength and flexbility.    Currently in Pain? No/denies                                   Access Code: [facility] URL: [email or web address] Date: 12/17/2020 Prepared by: Janann August  Reviewed and updated finalized HEP; see MedBridge for further details.   Exercises Sit to Stand with Armchair - 2 x daily - 5 x weekly - 2 sets - 8-10 reps Side Stepping with Resistance at Thighs and Counter Support - 2 x daily - 5 x weekly - 3 sets - with use of green tband.  Heel rises with counter support - 1-2 x daily - 5 x weekly - 1-2 sets - 10 reps Standing Marching - 1-2 x  daily - 5 x weekly - 3 sets - forwards and backwards with use of red tband  Standing Knee Flexion AROM with Chair Support - 1-2 x daily - 5 x weekly - 1-2 sets - 8-10 reps Romberg Stance on Foam Pad - 1-2 x daily - 5 x weekly - 2 sets - 5 reps - head nods x5 reps, head turns x5 reps  Wide Stance with Eyes Closed on Foam Pad - 1 x daily - 5 x weekly - 3 sets - 15-20 hold - with fingertips for balance   PT Education - 12/17/20 1626    Education Details final HEP for strength/balance, trying to incorporate incr activity throughout the day - using seated peddle bike or doing seated exercises as well during commercal breaks    Person(s) Educated Patient;Spouse    Methods Explanation;Demonstration;Handout    Comprehension Verbalized understanding;Returned demonstration            PT Short Term Goals - 10/24/20 1616      PT SHORT TERM GOAL #1   Title ALL STGS = LTGS              PT Long Term Goals - 12/17/20 1628      PT LONG TERM GOAL #1   Title Pt will be independent with final HEP in order to build upon functional gains made in therapy. ALL LTGS DUE 12/25/20    Baseline finalized HEP on 12/17/20    Time 4    Period Weeks    Status Achieved      PT LONG TERM GOAL #2   Title Pt will improve 3MWT distance by at least 50' in order to demo improved walking endurance.    Baseline 345-500' with [place] with quad tip on 12/15/20 (previously 345')    Time 4    Period Weeks    Status Not Met      PT LONG TERM GOAL #3   Title Pt will improve gait speed with LRAD to at least 2.0 ft/sec in order to demo improved community mobility and derr fall risk.    Baseline 1.89 seconds with SPC with quad tip; 1.79 ft/sec on 11/17/20, 2.00 ft/sec on 12/15/20    Time 4    Period Weeks    Status Achieved      PT LONG TERM GOAL #4   Title Pt will perform at least 9 sit <> stands during 30 escond chair stand with BUE support in order to demo improved BLE strength.    Baseline 7 sit <> stands on 11/17/20, 7 sit <> stands on 12/15/20 with BUE support    Time 4    Period Weeks    Status Not Met      PT LONG TERM GOAL #5   Title Pt will ambulate at least 500' with supervision over indoor/outdoor surfaces with LRAD and perform a curb in order to demo improved community mobility.    Baseline with supervision/min guard with spc with 4 prong tip - did not perform on 11/27/20 due to cold weather.    Time 4    Period Weeks    Status On-going          PHYSICAL THERAPY DISCHARGE SUMMARY  Visits from Start of Care: 31  Current functional level related to goals / functional outcomes: See LTGs.   Remaining deficits: Impaired strength, impaired balance, gait abnormalities.   Education / Equipment: HEP   Plan: Patient agrees to discharge.  Patient goals were partially met.  Patient is being discharged due to meeting the stated rehab goals.  ?????         being discharged due to a  plateau in progress.     Plan - 12/17/20 1628    Clinical Impression Statement Today's skilled session focused on reviewing and upgrading pt's final HEP as appropriate for strength/balance for D/C visit. No issues noted with exercises. Continued to encourage incr activity throughout the day by use of seated peddle bike and trying to go on more walks outdoors with wife using RW. Will D/C at this time due to a plateau in progress- pt and pt's spouse in agreement.    Personal Factors and Comorbidities Comorbidity 3+;Past/Current Experience    Comorbidities L MCA CVA, Coronary Artery Disease, Heart Failure, Hyperlipidemia, Osteoarthritis, GERD, Renal Cell Cancer requiring nephrectomy (active)    Examination-Activity Limitations Locomotion Level;Stand;Transfers;Stairs    Examination-Participation Restrictions Community Activity;Occupation   work (is a Customer service manager)   [employer] Stable/Uncomplicated    Rehab Potential Good    PT Frequency 2x / week    PT Duration 4 weeks    PT Treatment/Interventions ADLs/Self Care Home Management;Stair training;Gait training;DME Instruction;Functional mobility training;Therapeutic activities;Therapeutic exercise;Balance training;Neuromuscular re-education;Patient/family education;Orthotic Fit/Training;Passive range of motion;Vestibular;Energy conservation    PT Next Visit Plan D/C visit    PT Home Exercise Plan added eccentric sitting to HEP, 5 reps per set    Consulted and Agree with Plan of Care Patient;Family member/caregiver    Family Member Consulted wife, [name]           Patient will benefit from skilled therapeutic intervention in order to improve the following deficits and impairments:  Abnormal gait,Decreased activity tolerance,Decreased balance,Decreased endurance,Decreased knowledge of use of DME,Decreased range of motion,Decreased strength,Difficulty walking,Decreased safety awareness  Visit Diagnosis: 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 Nonischemic cardiomyopathy ([place]) 10/06/2020  . Cryptogenic stroke ([place]) 10/06/2020  . Acute on chronic combined systolic and diastolic CHF (congestive heart failure) ([place]) 07/17/2020  . Renal cell carcinoma ([place]) 07/17/2020  . Protein-calorie malnutrition, severe 07/08/2020  . Acute respiratory failure with hypoxia ([place]) 07/05/2020  . Iron deficiency anemia due to chronic blood loss 06/19/2020  . OSA (obstructive sleep apnea) 01/09/2018  . Pulmonary nodule, left 03/15/2017  . Former smoker 11/04/2014  . Insomnia 05/27/2014  . Plantar wart of left foot 11/27/2010  . Type 2 diabetes mellitus with other specified complication ([place]) 75/91/6384  . PSA, INCREASED 09/15/2009  . Diverticulitis of colon 05/08/2009  . Asthma, moderate persistent 06/19/2008  . ANKLE PAIN, CHRONIC 06/05/2007  . Allergic rhinitis 05/17/2007  . ERECTILE DYSFUNCTION, SECONDARY TO MEDICATION 05/17/2007  . Hyperlipidemia 04/06/2007  . Anxiety state 04/06/2007  . Essential hypertension 04/06/2007  . Coronary artery disease involving native coronary artery of native heart without angina pectoris 04/06/2007    Lillia Pauls, DPT 12/17/2020, 4:30 PM  [facility] [address] [place], [place], [address] Phone: [phone]   Fax:  [phone]  Name: KIENAN DOUBLIN MRN: [id number] Date of Birth: 1956-06-27

## 2020-12-17 NOTE — Patient Instructions (Signed)
Access Code: [facility] URL: [email or web address] Date: 12/17/2020 Prepared by: Janann August  Exercises Sit to Stand with Armchair - 2 x daily - 5 x weekly - 2 sets - 8-10 reps Side Stepping with Resistance at Thighs and Counter Support - 2 x daily - 5 x weekly - 3 sets Heel rises with counter support - 1-2 x daily - 5 x weekly - 1-2 sets - 10 reps Standing Marching - 1-2 x daily - 5 x weekly - 3 sets Standing Knee Flexion AROM with Chair Support - 1-2 x daily - 5 x weekly - 1-2 sets - 8-10 reps Romberg Stance on Foam Pad - 1-2 x daily - 5 x weekly - 2 sets - 5 reps Wide Stance with Eyes Closed on Foam Pad - 1 x daily - 5 x weekly - 3 sets - 15-20 hold

## 2020-12-18 ENCOUNTER — Encounter: Payer: BC Managed Care – PPO | Attending: Psychology

## 2020-12-18 DIAGNOSIS — I6789 Other cerebrovascular disease: Secondary | ICD-10-CM | POA: Insufficient documentation

## 2020-12-18 DIAGNOSIS — F0390 Unspecified dementia without behavioral disturbance: Secondary | ICD-10-CM | POA: Diagnosis present

## 2020-12-18 DIAGNOSIS — I639 Cerebral infarction, unspecified: Secondary | ICD-10-CM | POA: Diagnosis present

## 2020-12-18 DIAGNOSIS — I6990 Unspecified sequelae of unspecified cerebrovascular disease: Secondary | ICD-10-CM | POA: Insufficient documentation

## 2020-12-18 DIAGNOSIS — R413 Other amnesia: Secondary | ICD-10-CM

## 2020-12-25 ENCOUNTER — Other Ambulatory Visit: Payer: Self-pay

## 2020-12-25 ENCOUNTER — Encounter: Payer: BC Managed Care – PPO | Admitting: Psychology

## 2020-12-30 ENCOUNTER — Other Ambulatory Visit: Payer: Self-pay

## 2020-12-30 ENCOUNTER — Encounter: Payer: Self-pay | Admitting: Psychology

## 2020-12-30 ENCOUNTER — Encounter (HOSPITAL_BASED_OUTPATIENT_CLINIC_OR_DEPARTMENT_OTHER): Payer: BC Managed Care – PPO | Admitting: Psychology

## 2020-12-30 DIAGNOSIS — I639 Cerebral infarction, unspecified: Secondary | ICD-10-CM | POA: Diagnosis not present

## 2020-12-30 DIAGNOSIS — I6990 Unspecified sequelae of unspecified cerebrovascular disease: Secondary | ICD-10-CM | POA: Diagnosis not present

## 2020-12-30 DIAGNOSIS — R413 Other amnesia: Secondary | ICD-10-CM | POA: Diagnosis not present

## 2020-12-30 DIAGNOSIS — I6789 Other cerebrovascular disease: Secondary | ICD-10-CM

## 2020-12-30 DIAGNOSIS — F0390 Unspecified dementia without behavioral disturbance: Secondary | ICD-10-CM | POA: Diagnosis not present

## 2020-12-30 NOTE — Progress Notes (Signed)
Neuropsychological Evaluation   Patient:  Larry Garner   DOB: Nov 08, 1955  MR Number: [id number]  Location: [facility] [place], [place] [id number] [facility] [place] [address] Dept: [phone]  Start: 8 AM End: 9 AM  Provider/Observer:     Edgardo Roys PsyD  Chief Complaint:      Chief Complaint  Patient presents with  . Memory Loss  . Sleeping Problem    Waking up with difficulty differentiating recent dream state and reality  . Other    Changes in motivation along with expressive language disturbance including word finding evidenced by circumlocutions.    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  Ongoing cognitive changes include issues with reduced memory and learning capacity, residual word finding difficulties and difficulty with cognition particularly around primary mathematical abilities.  The patient has also described generalized weakness and has had several falls.  The patient also has significant issues with his kidneys and is currently on immunotherapy as he has not been cleared to have his kidney removed due to kidney carcinoma.  The stroke occurred on 07/31/2020 with imaging at the time identifying left inferior parietal lobe as well as cortex along the left postcentral gyrus and subcortical white matter deep into the superior left central sulcus.  There was associated crypt to toxic edema without substantial mass-effect or space-occupying hemorrhage although there was a tiny volume of hemorrhage involving the inferior left parietal lobe.  The patient has been having ongoing symptoms  although he has improved significantly from his stroke.  The patient has a past medical history including type 2 diabetes, coronary artery disease, diverticulitis, obstructive sleep apnea with CPAP, hypertension and hyperlipidemia, congestive heart failure, myocardial infarction in 2000, renal cell carcinoma with complications to his care from his December 2021 stroke event.  The patient was scheduled to have surgery for renal cell cancer on August 06, 2020 and this was postponed because of his stroke.  The patient was more recently started on immunotherapy but he has lost more than 60 pounds and has become very weak and they are waiting for him to gain strength and gain weight back before they remove his kidney.  Medical records show that the patient is continuing to receive rehabilitative therapies particularly speech and language work as well as other rehab efforts.  The patient is being followed both by cardiology, nephrology and neurology.  The patient describes ongoing symptoms and difficulties following his stroke in December 2021.  The patient is having difficulties keeping up with dates, processing numbers accurately, memory and learning difficulties and weakness in his legs and arms and difficulty writing.  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  The patient's wife reports that she is noting some things that appear to be getting worse more recently.  She has a lot of concerns that the patient is now waking up at night asking strange questions and has recently lost 60 pounds.  Early on there were significant issues with remembering his wife's children's name and other word finding and memory issues.  More recently, the patient is described by his  wife as waking up at night and asking strange questions and being quite confused.  However, her description of these events sound like typical sleep disturbance with hypnagogic type dreams and the patient waking up  while still having the lingering effects of his dream.  She reports that the strange questions and what she describes as speaking "gibberish" only occur either immediately after waking at night or after taking a nap during the day.  I do not think these things represent a significant change in status beyond a significant sleep disturbance.  The patient's sleep patterns are described as waking around 4 times per night likely due to itching as a side effect of his immunotherapy.  When he wakes up he can be very confused for period of time.  His appetite is described as not particularly good and that he has lost 60 pounds since this past July and 25 pounds since his stroke.  He has not been able to return to work and he is in the process of deciding whether he will make efforts to return to work or will go ahead and retire.  MRI post stroke identified acute versus early subacute infarcts involving the left inferior parietal lobe as well as the cortex along the left postcentral gyrus and subcortical white matter deep to the superior left central sulcus.  Edema was identified without substantial mass-effect.  There was also numerous patchy T2 flair hyperintensity in the bilateral cerebral white matter consistent with chronic small vessel disease.  Testing Administered:  The patient was administered the Wechsler Adult Intelligence Scale-IV as well as the Wechsler Memory Scale-IV.  The patient was also given several verbal fluency measures as well.  Participation Level:   Active  Participation Quality:  Redirectable      Behavioral Observation:  Well Groomed, Alert, and Appropriate.   Test Results:   Initially, an estimation was made as to the patient's historical/premorbid intellectual and cognitive functioning.  The patient completed his bachelor's of science and bachelor's of art degree from [employer] with an average GPA and always did well in mathematics and other cognitive areas.  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  A conservative estimate was made as to the patient's historical/premorbid intellectual and cognitive abilities for comparison to current neuropsychological functioning.  A conservative estimate of the patient's premorbid function is at the patient likely was operating in the high average to superior range in many cognitive domains an hour utilize a conservative estimate standard score of 115 for comparison purposes.    Composite Score Summary  Scale Sum of Scaled Scores Composite Score Percentile Rank 95% Conf. Interval Qualitative Description  Verbal Comprehension 25 VCI 91 27 86-97 Average  Perceptual Reasoning 9 PRI 58 0.3 54-66 Extremely Low  Working Memory 3 WMI 53 0.1 49-63 Extremely Low  Processing Speed 2 PSI 50 <0.1 47-63 Extremely Low  Full Scale 39 FSIQ 59 0.3 56-64 Extremely Low  General Ability 34 GAI 73 4 69-79 Borderline   The patient produced a current full-scale IQ score of 59 which falls below the 1st percentile and is an extremely low range.  This performance is more than 50 standard points below predicted level and represents a significant loss of function from premorbid abilities.  This suggest not just 1 but multiple areas of cognitive loss.  I also calculated the patient's general abilities index score which places less emphasis on working memory and information processing speed  scores as those measures tend to be more susceptible to acute changes.  Patient produced a general abilities index score of 73 which falls at the 4th percentile and is also severely impaired and falls in the borderline range of global cognitive functioning.  Clearly, there are significant deficits that are directly tied to recent stroke event.  These global deficits go beyond those that would be solely due to changes in left parietal functioning although this area can create a wide range of cognitive changes within abilities to  process and comprehend not only visual-spatial components but also language complexities and components.  This would suggest that some of the significant white matter changes both from small vessel disease as well as subcortical white matter injuries during the stroke event are causing more widespread cognitive change.   Verbal Comprehension Subtests Summary  Subtest Raw Score Scaled Score Percentile Rank Reference Group Scaled Score SEM  Similarities 18 7 16 6  1.04  Vocabulary 35 9 37 10 0.67  Information 12 9 37 9 0.73  (Comprehension) 16 7 16 6  1.08    The patient produced a verbal comprehension and a score of 91 which falls at a 27% and is in the average range relative to a normative population.  However, this likely represents a significant loss from premorbid functioning.  The patient showed average performance with regard to vocabulary and his general fund of information but showed difficulties with verbal reasoning and problem-solving as well as his comprehension and social judgment capacities.  All of these would be consistent with significant change or loss from premorbid functioning.  Perceptual Reasoning Subtests Summary  Subtest Raw Score Scaled Score Percentile Rank Reference Group Scaled Score SEM  Block Design 5 2 0.4 1 1.08  Matrix Reasoning 5 5 5 2  0.90  Visual Puzzles 2 2 0.4 1 0.85  (Figure Weights) 0 1 0.1 1 0.95  (Picture Completion) 2 3 1 1  1.16   The patient produced a perceptual reasoning index score of 58 which falls below the 1st percentile and is in the extremely low range relative to a normative population and also represents a significant loss or change based on predicted levels.  The patient is showing significant deficits with regard to visual analysis and organization, visual reasoning and visual problem-solving components.   Working Doctor, general practice Raw Score Scaled Score Percentile Rank Reference Group Scaled Score SEM  Digit Span 8 1 0.1 1  0.79  Arithmetic 5 2 0.4 3 0.99  (Letter-Number Seq.) 6 1 0.1 1 1.04   The patient produced a working memory index score of 53 which also falls below the 1st percentile relative to a normative population and is performing in the borderline range indicating significant severe deficits in this cognitive domain.  The patient had severe deficits with regard to auditory encoding abilities and working memory and severe difficulties with cognitive flexibility and focus execute abilities.   Processing Speed Subtests Summary  Subtest Raw Score Scaled Score Percentile Rank Reference Group Scaled Score SEM  Symbol Search 4 1 0.1 1 1.31  Coding 6 1 0.1 1 0.99  (Cancellation) 0 1 0.1 1 1.34    The patient produced a processing speed index score of 50 which also falls below the 1st percentile and is in the extremely low range relative to a normative population.  The patient showed severe deficits with regard to visual scanning, visual searching and overall speed of mental operations.  Severe deficits for focus execute abilities are  noted with severe slowing of information processing speed.  This would further suggest significant changes in white matter tracts potentially related to both chronic small vessel disease and microvascular ischemic changes as well as more recent effects of his stroke event.  Index Score Summary  Index Sum of Scaled Scores Index Score Percentile Rank 95% Confidence Interval Qualitative Descriptor  Auditory Memory (AMI) 8 49 <0.1 45-58 Extremely Low  Visual Memory (VMI) 20 69 2 65-76 Extremely Low  Visual Working Memory (VWMI) 5 52 0.1 48-63 Extremely Low  Immediate Memory (IMI) 13 54 0.1 50-63 Extremely Low  Delayed Memory (DMI) 15 58 0.3 54-67 Extremely Low   The patient was administered the Wechsler Adult Intelligence Scale for in-depth analysis of learning and memory functions.  However, the patient not only showed severe deficits with regard to auditory encoding as  identified on the Wechsler Adult Intelligence Scale he also showed severe deficits with regard to visual encoding capacity with severe deficits on the visual working memory component of the [employer].  This level of encoding deficits would create severe and significant memory and learning deficits due to inability to accurately encode information and organize that information to allow for effective storage and organization.  Breaking the patient's memory functions down between auditory versus visual memory the patient produced an auditory memory index score 49 which falls below the 1st percentile and is in the extremely low range.  While the patient did relatively better on memory functions he still produced a visual memory index score of 69 which falls at the 2nd percentile and is also in the extremely low range.  Auditory memory is more greatly impacted and impaired than visual memory and would be consistent with his primary left hemisphere focus of his stroke.  Breaking the patient's memory functions down between immediate versus delayed the patient produced an immediate memory index score of 54 which falls below the 1st percentile and in the extremely low range and a delayed memory index score of 58 which also falls below the 1st percentile in the extremely low range.  It is interesting to note that the patient did show some better performance on recognition formats for both auditory and visual information and while still below expected levels his performance did improve under recognition format.  This does suggest that he is actually learning more information and he is able to retrieve and free recall formats suggesting significant white matter deficits and changes both from chronic microvascular ischemic changes as well as results of his left parietal and subcortical stroke.   Primary Subtest Scaled Score Summary  Subtest Domain Raw Score Scaled Score Percentile Rank  Logical Memory I AM 8  2 0.4  Logical Memory II AM 5 3 1   Verbal Paired Associates I AM 3 2 0.4  Verbal Paired Associates II AM 0 1 0.1  Designs I VM 51 8 25  Designs II VM 48 10 50  Visual Reproduction I VM 12 1 0.1  Visual Reproduction II VM 0 1 0.1  Spatial Addition VWM 3 4 2   Symbol Span VWM 0 1 0.1    Process Score Raw Score Scaled Score Percentile Rank Cumulative Percentage (Base Rate)  LM II Recognition 22 - - 17-25%  VPA II Recognition 29 - - 3-9%   DE II Recognition 13 - - 26-50%  VR II Recognition 1 - - <=2%   Summary of Results:   The patient displayed severe and profound cognitive deficits on a wide range  of neuropsychological domains.  The patient showed significant loss in global cognitive capacity with greatest deficits identified for information processing speed, working memory and encoding abilities both auditory and visual as well as significant visual spatial and perceptual reasoning deficits.  The patient showed relatively well-preserved vocabulary knowledge and general fund of information capacity but displayed deficits with regard to visual reasoning and auditory reasoning abilities.  Significant change in information processing speed and focus execute abilities as well as significant auditory and visual encoding deficits are noted.  Given the level of significant auditory and visual encoding deficits it is also not unusual the severity of his memory and learning capacity.  While there was some indication of laterality with auditory memory more profoundly impaired than visual memory they both showed significant deficits.  Impaired encoding capacity and limitations in organization and storage of information are noted.  The patient did show some improvement with memory under cueing/recognition format suggesting that some information is actually being stored for later recall but retrieval of that information and organization of that information is significantly impaired consistent with his left  parietal lobe involvement.  Impression/Diagnosis:   Overall, the results of the current neuropsychological evaluation do suggest significant and continuing cognitive deficits that are likely primarily explained by his combination of chronic impacts of microvascular ischemic changes and past history of myocardial infarction and congestive heart failure along with the acute effects of his left parietal and subcortical/white matter involvement.  Acute changes are identified post stroke consistent with these findings.  There is some degree of lateralization identified with greater deficits on left hemisphere but overall the patient shows significant deficits on a wide range of neurocognitive components.  Severe and profound encoding deficits both auditory and visual as well as significant auditory and memory deficits with auditory memory greater than visual memory deficits.  The patient shows significant impairments for information processing speed consistent with significant white matter involvement overall and his difficulties identifying components of information impact his ability to overall comprehension more gestalt information.  The word finding and slowed information processing speed are both likely directly related to white matter involvement and his deficits.  This pattern is consistent with major neurocognitive disorder directly related to his cerebrovascular disease/late effects of his stroke in combination with more chronic underlying microvascular ischemic changes.  As far as coping and treatment intervention the patient is struggling with a significant loss and capacity and has not been able to return to work.  The level of cognitive deficits would preclude his ability to return to his previous work environment as significant left parietal and subcortical involvement continue to produce severe memory deficits and neurocognitive deficits in a wide range of domains.  The patient does have relatively  well preserved vocabulary and knowledge skills but executive functioning deficits, severe memory deficits and attentional deficits and significant overall slowed information processing speed persist.  The patient has benefited from rehabilitative efforts and he does appear to be able to learn new information with repeated exposure and shows improvement under cued recall setting which could be quite useful for rehabilitative efforts.  Working on strategies for more effectively encoding information could be helpful although subcortical involvements are likely playing a significant role in these deficits.  I will sit down with the patient review the results of the current neuropsychological evaluation and go over in greater detail various recommendations going forward.  We are still in a window of time where neurocognitive improvements can be made post stroke although he is  passing the primary phase of recovery and improvement and it is likely that there will be significant long-term cognitive deficits persisting.  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 Clinical Neuropsychologist

## 2021-01-01 ENCOUNTER — Encounter (HOSPITAL_BASED_OUTPATIENT_CLINIC_OR_DEPARTMENT_OTHER): Payer: BC Managed Care – PPO | Admitting: Psychology

## 2021-01-01 ENCOUNTER — Other Ambulatory Visit: Payer: Self-pay

## 2021-01-01 DIAGNOSIS — R413 Other amnesia: Secondary | ICD-10-CM | POA: Diagnosis not present

## 2021-01-01 DIAGNOSIS — F0390 Unspecified dementia without behavioral disturbance: Secondary | ICD-10-CM

## 2021-01-01 DIAGNOSIS — I6990 Unspecified sequelae of unspecified cerebrovascular disease: Secondary | ICD-10-CM | POA: Diagnosis not present

## 2021-01-01 DIAGNOSIS — I639 Cerebral infarction, unspecified: Secondary | ICD-10-CM | POA: Diagnosis not present

## 2021-01-05 NOTE — Progress Notes (Signed)
Cardiology Office Note   Date:  01/08/2021   ID:  LYFE MONGER, DOB 1956-02-17, MRN [id number]  PCP:  Marin Olp, MD  Cardiologist: Dr. Johnsie Cancel, MD   Chief Complaint  Patient presents with  . Follow-up     History of Present Illness: DEMORIO Garner is a 65 y.o. male who presents for follow up, seen for Dr. Johnsie Cancel.   Larry Garner has a hx of CAD s/p PCI to LAD with restenosis, subsequently requiring CABG 06/19/1999. Last cath in 2017 with total occlusion in LAD stent, total occlusion of OM #1/ramus intermediate, 60-70% 1st diag, total occlusion of native RCA with LVEF now at 20-25% per last echo at [facility]. 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    He was admitted to [facility] 07/2020 with CVA and was placed on Eliquis and ASA. Echo as above with newly reduced EF at 20-25% not felt to be in the setting of MI with no EKG change and no anginal symptoms. Follow up echo 09/2020 with EF at 30-35%. Myoview 08/20/20 with old scar and no ischemia. He was last seen by Dr. Johnsie Cancel at which time his Delene Loll was increased to mid dose and reports he has been tolerating well. He is following with nephrology and oncology for timing of nephrectomy however needs weight to be better. This has improved. He reports that he feels great today. Denies recent angina, SOB, LE edema, palpitations, dizziness or syncope. Follows also with neuro with hx of stroke. He mainly has deficits with memory, no physical limitations.   Past Medical History:  Diagnosis Date  . Allergic rhinitis   . Allergy   . Anxiety   . Asthma   . CHF (congestive heart failure) ([place])   . Diabetes ([place])   . Diverticulitis   . Dyspnea   . GERD (gastroesophageal reflux disease)    very occ  . History of heart attack   . HLD (hyperlipidemia)   . HTN (hypertension)   . Myocardial infarction ([place]) 2000  . Sleep apnea    wears cpap   . Tubular adenoma of colon 09/2008     Past Surgical History:  Procedure Laterality Date  . CARDIAC CATHETERIZATION N/A 07/20/2016   Procedure: Left Heart Cath and Cors/Grafts Angiography;  Surgeon: Belva Crome, MD;  Location: [facility];  Service: Cardiovascular;  Laterality: N/A;  . CATARACT EXTRACTION Bilateral 06/06/2019   Dr. Tommy Rainwater. oct left, dec right 2020   . COLONOSCOPY    . CORONARY ANGIOPLASTY    . CORONARY ARTERY BYPASS GRAFT  06/1999  . POLYPECTOMY    . RENAL BIOPSY  06/2020  . TONSILLECTOMY     late50's early 83's     Current Outpatient Medications  Medication Sig Dispense Refill  . albuterol (VENTOLIN HFA) 108 (90 Base) MCG/ACT inhaler Inhale 2 puffs into the lungs every 6 (six) hours as needed for wheezing or shortness of breath. 18 g 11  . ALPRAZolam (XANAX) 0.5 MG tablet Take 1 tablet (0.5 mg total) by mouth every 6 (six) hours as needed. 30 tablet 3  . apixaban (ELIQUIS) 5 MG TABS tablet Take 1 tablet (5 mg total) by mouth in the morning and at bedtime. 180 tablet 3  . Azelastine-Fluticasone 137-50 MCG/ACT SUSP Place 1 spray into the nose daily as needed (allergies).    . bisoprolol (ZEBETA) 10 MG tablet TAKE 1 TABLET BY MOUTH EVERY  DAY 90 tablet 3  . Blood Glucose Monitoring Suppl (CONTOUR NEXT ONE) KIT Use to test blood sugar daily. Dx: E11.9 1 kit 3  . cyanocobalamin 1000 MCG tablet Take 1,000 mcg by mouth daily.    . empagliflozin (JARDIANCE) 10 MG TABS tablet Take 1 tablet (10 mg total) by mouth daily before breakfast. 30 tablet 0  . famciclovir (FAMVIR) 500 MG tablet TAKE 3 TABLETS BY MOUTH EVERY DAY AS NEEDED AT FIRST SIGN OF OUTBREAK OF FEVER BLISTER 15 tablet 1  . furosemide (LASIX) 40 MG tablet Take 1 tablet (40 mg total) by mouth daily. 90 tablet 3  . glimepiride (AMARYL) 2 MG tablet Take 2 mg by mouth as needed. Only use if blood sugar is over 110    . glucose blood (BAYER CONTOUR NEXT TEST) test strip Use to test blood sugars daily. Dx: E11.9 100 each 12  . hydrOXYzine  (ATARAX/VISTARIL) 25 MG tablet Take 25 mg by mouth 3 (three) times daily as needed.    . metFORMIN (GLUCOPHAGE) 500 MG tablet Take 1 tablet (500 mg total) by mouth 2 (two) times daily with a meal. No print 1 tablet 0  . Multiple Vitamin (MULTIVITAMIN) tablet Take 1 tablet by mouth daily.    . nitroGLYCERIN (NITROSTAT) 0.4 MG SL tablet PLACE 1 TABLET UNDER THE TONGUE EVERY 5 MINUTES AS NEEDED FOR CHEST PAIN. 25 tablet 1  . oxymetazoline (AFRIN) 0.05 % nasal spray Place 1 spray into both nostrils 2 (two) times daily as needed for congestion.    . predniSONE (DELTASONE) 5 MG tablet Take 1 tablet by mouth daily.    . rosuvastatin (CRESTOR) 20 MG tablet TAKE 1 TABLET BY MOUTH DAILY 90 tablet 4  . sacubitril-valsartan (ENTRESTO) 49-51 MG Take 1 tablet by mouth 2 (two) times daily. 60 tablet 11  . spironolactone (ALDACTONE) 25 MG tablet Take 0.5 tablets (12.5 mg total) by mouth daily. 45 tablet 3  . triamcinolone cream (KENALOG) 0.1 % Apply 1 application topically 2 (two) times daily.    . zaleplon (SONATA) 10 MG capsule Take 1 capsule (10 mg total) by mouth at bedtime as needed. for sleep 30 capsule 5   No current facility-administered medications for this visit.    Allergies:   Morphine sulfate    Social History:  The patient  reports that he quit smoking about 43 years ago. His smoking use included cigarettes. He has a 2.00 pack-year smoking history. He has never used smokeless tobacco. He reports previous alcohol use. He reports that he does not use drugs.   Family History:  The patient's family history includes Alzheimer's disease in his mother; Colon cancer in his paternal aunt; Colon polyps in his father and mother; Hyperlipidemia in his father; Hypertension in his father and mother.    ROS:  Please see the history of present illness. Otherwise, review of systems are positive for none.   All other systems are reviewed and negative.    PHYSICAL EXAM: VS:  BP (!) 110/50   Pulse 79   Ht 5'  11" (1.803 m)   Wt 149 lb 8 oz (67.8 kg)   SpO2 97%   BMI 20.85 kg/m  , BMI Body mass index is 20.85 kg/m.   General: Ill-appearing, NAD Neck: Negative for carotid bruits. No JVD Lungs:Clear to ausculation bilaterally. Breathing is unlabored. Cardiovascular: RRR with S1 S2. + murmur Extremities: No edema. Radial pulses 2+ bilaterally Neuro: Alert and oriented. No focal deficits. No facial asymmetry. MAE spontaneously. Psych:  Responds to questions appropriately with normal affect.     EKG:  EKG is not ordered today.  Recent Labs: 07/05/2020: B Natriuretic Peptide 1,435.5; Magnesium 2.1; TSH 0.369 11/20/2020: ALT 20; BUN 42; Creatinine, Ser 1.05; Hemoglobin 8.7 Repeated and verified X2.; Platelets 418.0; Potassium 5.1; Sodium 133    Lipid Panel    Component Value Date/Time   CHOL 97 02/21/2020 1652   TRIG 175.0 (H) 02/21/2020 1652   HDL 27.60 (L) 02/21/2020 1652   CHOLHDL 4 02/21/2020 1652   VLDL 35.0 02/21/2020 1652   LDLCALC 34 02/21/2020 1652   LDLDIRECT 54.0 01/16/2019 0930     Wt Readings from Last 3 Encounters:  01/08/21 149 lb 8 oz (67.8 kg)  12/16/20 141 lb 9.6 oz (64.2 kg)  12/12/20 141 lb 9.6 oz (64.2 kg)    Other studies Reviewed: Additional studies/ records that were reviewed today include:. Review of the above records demonstrates:   [facility] 07/20/2016:   The left ventricular systolic function is normal.  The left ventricular ejection fraction is 45-50% by visual estimate.  There is no mitral valve regurgitation.  Ost 1st Diag to 1st Diag lesion, 65 %stenosed.  Mid LAD lesion, 100 %stenosed.  SVG and is normal in caliber.  SVG.  LIMA.  Dist LAD lesion, 50 %stenosed.  Ost 2nd Diag to 2nd Diag lesion, 100 %stenosed.  [place] lesion, 100 %stenosed.  Mid RCA lesion, 100 %stenosed.  Prox Graft to Dist Graft lesion, 20 %stenosed.  Origin lesion, 40 %stenosed.   Widely patent previously placed bypass grafts including LIMA to mid LAD,  saphenous graft to diagonal, and saphenous vein graft to obtuse marginal #1/ramus intermedius.  Total occlusion of the proximal LAD in stent.  Total occlusion of the obtuse marginal #1/ramus intermedius  60-70% stenosis in the native first diagonal  Total occlusion of the native mid right coronary collateralized by both right to right and left-to-right collaterals. The right coronary is dominant.  Normal left ventricular hemodynamics. LVEF 45-50 %.  Recommendations:   Continue aggressive risk factor modification.  In absence of cardiac symptoms, medical therapy seems most appropriate. Therefore, CTO recanalization will not be pursued..   Echo 12/10/20:    1. Global hypokinesis worse in the septal and inferior myocardium. Left  ventricular ejection fraction, by estimation, is 35 to 40%. The left  ventricle has moderately decreased function. The left ventricle  demonstrates global hypokinesis. Left  ventricular diastolic parameters are consistent with Grade II diastolic  dysfunction (pseudonormalization). Elevated left ventricular end-diastolic  pressure. The average left ventricular global longitudinal strain is -12.8  %. The global longitudinal  strain is abnormal.  2. Right ventricular systolic function is mildly reduced. The right  ventricular size is moderately enlarged. There is mildly elevated  pulmonary artery systolic pressure.  3. Right atrial size was mildly dilated.  4. The mitral valve is normal in structure. Trivial mitral valve  regurgitation. No evidence of mitral stenosis.  5. The aortic valve is tricuspid. Aortic valve regurgitation is trivial.  No aortic stenosis is present.  6. Aortic dilatation noted. There is mild dilatation of the ascending  aorta, measuring 39 mm.  7. The inferior vena cava is normal in size with greater than 50%  respiratory variability, suggesting right atrial pressure of 3 mmHg.   Stress test 08/20/20:    The left  ventricular ejection fraction is severely decreased (<30%).  Nuclear stress EF: 27%.  There was no ST segment deviation noted during stress. Baseline T wave inversions noted.  Findings consistent with prior myocardial infarction.  This is an intermediate-high risk study.   There is a medium size fixed, severe perfusion defect present in the basal inferoseptal, basal inferior, mid inferior, apical inferior and apex location. No ischemia on perfusion imaging. Findings consistent with prior infarction. Perfusion is similar to prior exam in 2017 on side by side comparison. EF has decreased further as compared to prior exam, from 49% to 27%, making this a intermediate-high risk study.    ASSESSMENT AND PLAN:  1. CAD s/p CABG: -Last cath 2017 as above with patent bypass grafts and CTO of RCA with collaterals>> medical management -Recent stress test 08/20/2020 with no ischemia and old MI no perfusion change from prior study -Continue statin -No ASA given Eliquis for CVA -Denies anginal symptoms  2.  Chronic systolic CHF: -Last echocardiogram 09/11/2020 with LVEF at 30 to 35%>>repeat 12/10/20 with EF at 35-40%, G2DD -Continue Entresto 49-51, spironolactone 12.5 mg -Titration limited by BP however tolerating well  3.  CVA: -Felt to be embolic and placed on Eliquis for anticoagulation -Residual seems to be impaired memory and no physical deficits -Prior ZIO with no PAF -Follows closely with neurology  4.  Renal cell carcinoma, left kidney: -Follows closely with nephrology and oncology with plans for nephrectomy however this has been delayed in the setting of CVA and weight loss -Hopes are within the next 6 months -Continues on immunotherapy  5.  DM2: -Follows closely with PCP  6.  HTN: -Stable at 110/50 on mid dose Entresto  7. Aortic dilatation: -Measuring 44m per echo -Follow with serial echos    Current medicines are reviewed at length with the patient today.  The patient  does not have concerns regarding medicines.  The following changes have been made:  no change  Labs/ tests ordered today include: None  No orders of the defined types were placed in this encounter.    Disposition:   FU with Dr. NJohnsie Cancelin 6 months  Signed, JKathyrn Drown NP  01/08/2021 12:41 PM    [place]  [id number]: [phone] Fax: [phone]

## 2021-01-06 ENCOUNTER — Other Ambulatory Visit: Payer: Self-pay

## 2021-01-06 ENCOUNTER — Encounter: Payer: Self-pay | Admitting: Psychology

## 2021-01-06 ENCOUNTER — Ambulatory Visit: Payer: BC Managed Care – PPO

## 2021-01-06 DIAGNOSIS — I69854 Hemiplegia and hemiparesis following other cerebrovascular disease affecting left non-dominant side: Secondary | ICD-10-CM | POA: Diagnosis not present

## 2021-01-06 DIAGNOSIS — R4701 Aphasia: Secondary | ICD-10-CM

## 2021-01-06 DIAGNOSIS — R41841 Cognitive communication deficit: Secondary | ICD-10-CM

## 2021-01-06 NOTE — Progress Notes (Signed)
Today I provided feedback regarding the results of the recent neuropsychological evaluation.  I will include a copy of the summary and impressions from the complete neuropsychological evaluations below for convenience.  His complete neuropsychological evaluation can be found in his EMR dated 12/30/2020.  We reviewed the results of the current neuropsychological evaluation with the patient and his family.    Summary of Results:                        The patient displayed severe and profound cognitive deficits on a wide range of neuropsychological domains.  The patient showed significant loss in global cognitive capacity with greatest deficits identified for information processing speed, working memory and encoding abilities both auditory and visual as well as significant visual spatial and perceptual reasoning deficits.  The patient showed relatively well-preserved vocabulary knowledge and general fund of information capacity but displayed deficits with regard to visual reasoning and auditory reasoning abilities.  Significant change in information processing speed and focus execute abilities as well as significant auditory and visual encoding deficits are noted.  Given the level of significant auditory and visual encoding deficits it is also not unusual the severity of his memory and learning capacity.  While there was some indication of laterality with auditory memory more profoundly impaired than visual memory they both showed significant deficits.  Impaired encoding capacity and limitations in organization and storage of information are noted.  The patient did show some improvement with memory under cueing/recognition format suggesting that some information is actually being stored for later recall but retrieval of that information and organization of that information is significantly impaired consistent with his left parietal lobe involvement.  Impression/Diagnosis:                     Overall, the results  of the current neuropsychological evaluation do suggest significant and continuing cognitive deficits that are likely primarily explained by his combination of chronic impacts of microvascular ischemic changes and past history of myocardial infarction and congestive heart failure along with the acute effects of his left parietal and subcortical/white matter involvement.  Acute changes are identified post stroke consistent with these findings.  There is some degree of lateralization identified with greater deficits on left hemisphere but overall the patient shows significant deficits on a wide range of neurocognitive components.  Severe and profound encoding deficits both auditory and visual as well as significant auditory and memory deficits with auditory memory greater than visual memory deficits.  The patient shows significant impairments for information processing speed consistent with significant white matter involvement overall and his difficulties identifying components of information impact his ability to overall comprehension more gestalt information.  The word finding and slowed information processing speed are both likely directly related to white matter involvement and his deficits.  This pattern is consistent with major neurocognitive disorder directly related to his cerebrovascular disease/late effects of his stroke in combination with more chronic underlying microvascular ischemic changes.  As far as coping and treatment intervention the patient is struggling with a significant loss and capacity and has not been able to return to work.  The level of cognitive deficits would preclude his ability to return to his previous work environment as significant left parietal and subcortical involvement continue to produce severe memory deficits and neurocognitive deficits in a wide range of domains.  The patient does have relatively well preserved vocabulary and knowledge skills but executive functioning  deficits, severe memory  deficits and attentional deficits and significant overall slowed information processing speed persist.  The patient has benefited from rehabilitative efforts and he does appear to be able to learn new information with repeated exposure and shows improvement under cued recall setting which could be quite useful for rehabilitative efforts.  Working on strategies for more effectively encoding information could be helpful although subcortical involvements are likely playing a significant role in these deficits.  I will sit down with the patient review the results of the current neuropsychological evaluation and go over in greater detail various recommendations going forward.  We are still in a window of time where neurocognitive improvements can be made post stroke although he is passing the primary phase of recovery and improvement and it is likely that there will be significant long-term cognitive deficits persisting.  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 Clinical Neuropsychologist

## 2021-01-07 NOTE — Therapy (Signed)
[facility] [address] [place], [place], [address] Phone: [phone]   Fax:  [phone]  Speech Language Pathology Treatment  Patient Details  Name: Larry Garner MRN: [id number] Date of Birth: [birth year] Referring Provider (SLP): Teresa Coombs, [place]   Encounter Date: 01/06/2021   End of Session - 01/07/21 0831    Visit Number 18    Number of Visits 25    Date for SLP Re-Evaluation 03/10/21    SLP Start Time 1321   pt a few minutes late   SLP Stop Time  1400    SLP Time Calculation (min) 39 min    Activity Tolerance Patient tolerated treatment well           Past Medical History:  Diagnosis Date  . Allergic rhinitis   . Allergy   . Anxiety   . Asthma   . CHF (congestive heart failure) ([place])   . Diabetes ([place])   . Diverticulitis   . Dyspnea   . GERD (gastroesophageal reflux disease)    very occ  . History of heart attack   . HLD (hyperlipidemia)   . HTN (hypertension)   . Myocardial infarction ([place]) 2000  . Sleep apnea    wears cpap   . Tubular adenoma of colon 09/2008    Past Surgical History:  Procedure Laterality Date  . CARDIAC CATHETERIZATION N/A 07/20/2016   Procedure: Left Heart Cath and Cors/Grafts Angiography;  Surgeon: Belva Crome, MD;  Location: [facility];  Service: Cardiovascular;  Laterality: N/A;  . CATARACT EXTRACTION Bilateral 06/06/2019   Dr. Tommy Rainwater. oct left, dec right 2020   . COLONOSCOPY    . CORONARY ANGIOPLASTY    . CORONARY ARTERY BYPASS GRAFT  06/1999  . POLYPECTOMY    . RENAL BIOPSY  06/2020  . TONSILLECTOMY     late50's early 60's    There were no vitals filed for this visit.   Subjective Assessment - 01/06/21 1320    Subjective SLP viewed results of neuropsych eval. Significant deficits in attention, and thus- memory.    Patient is accompained by: Family member   Larry Garner- wife   Currently in Pain? No/denies                 ADULT SLP  TREATMENT - 01/07/21 0001      General Information   Behavior/Cognition Alert;Cooperative;Pleasant mood      Treatment Provided   Treatment provided Cognitive-Linquistic      Cognitive-Linquistic Treatment   Treatment focused on Aphasia;Cognition    Skilled Treatment SLP asked Larry Garner what he would like to accomplish with his remaining visits and pt stated he would like to keep better track of appointments online, and to "keep clearer contact with phone" - Larry Garner explained this as "know how to use the phone." Due to this, SLP worked with pt's verbal expression describing steps to simplify his home screen by asking him today to look at and name apps he uses or does not use. Pt req'd extra time to answer 50% of SLP questions, and 80% of those looked to wife to answer the question for him.      Assessment / Recommendations / Plan   Plan Goals updated      Progression Toward Goals   Progression toward goals --   goals updated today to work on compensations for phone and mychart           SLP Education - 01/07/21 0830  Education Details goals changed to work on compensations for basic phone functions and for mychart to track appointments    Person(s) Educated Patient    Methods Explanation    Comprehension Verbalized understanding            SLP Short Term Goals - 10/27/20 1148      SLP SHORT TERM GOAL #1   Title pt will name 8 items in abstract categories in 5/6 opportunities in 3 sessions    Status Partially Met      SLP [place] #2   Title pt will describe to SLP 3 anomia compensations    Status Deferred      SLP SHORT TERM GOAL #3   Title pt will verbally sequence pertinent routines for pt with modified independence in 3 sessions    Status Partially Met      SLP SHORT TERM GOAL #4   Title pt will participate functionally in 10 minutes mod complex conversation using compensations for anomia successfully    Baseline 10-15-20    Status Achieved      SLP SHORT TERM  GOAL #5   Title pt will participate in further testing as necessary (WAB-R or if pt desires, CLQT)    Period --   or 5 total sessions   Status Deferred            SLP Long Term Goals - 01/07/21 4034      SLP LONG TERM GOAL #1   Title pt will participate functionally in 20 minutes mod complex/complex conversation using compensations for anomia successfully in 3 sessions    Baseline 10-29-20, 11-03-20    Period --   or 17 total sessions, for all LTGs   Status Achieved      SLP LONG TERM GOAL #2   Title pt will express numbers with 100% success with self correction in 3 sessions    Baseline 11-03-20, 11-05-20    Status Achieved      SLP LONG TERM GOAL #3   Title pt will listen to a 20 minute discourse (lecture, TED talk, etc) and functionally give a verbal synopsis with compensations (notes, anomia compensations, etc) in 3 sessions    Baseline 11-03-20    Time 4    Period Weeks   or 25 total visits for LTGs 3 and 4   Status On-going      SLP LONG TERM GOAL #4   Title pt will participate functionally in 20 minutes of mod complex/complex conversation using his compensations for anomia    Time 3    Period Weeks    Status On-going      SLP LONG TERM GOAL #5   Title pt will functionally describe the steps to compensate for basic phone usage and to find appointments with his phone, using written cues    Time 3    Period Weeks    Status On-going            Plan - 01/07/21 0831    Clinical Impression Statement Lyncoln expressed today he would like to work on compensations for tracking his appointments and for basic functions with his phone. SLP spent today working with pt in [place] what he would like done to simplify his phone home screen to maximize pt's attention skills. SLP believes pt would cont to benefit from skilled ST to target  aphasia and if pt desires, cognitive communication/attention skills. Plan is to continue ST following neuropsych consult. Plan will incr back to  x2/week ST.    Speech Therapy Frequency 2x / week    Duration 4 weeks   17   Treatment/Interventions Language facilitation;Cueing hierarchy;SLP instruction and feedback;Compensatory strategies;Patient/family education;Internal/external aids;Cognitive reorganization;Multimodal communcation approach;Functional tasks    Potential to Achieve Goals Good           Patient will benefit from skilled therapeutic intervention in order to improve the following deficits and impairments:   Aphasia  Cognitive communication deficit    Problem List Patient Active Problem List   Diagnosis Date Noted  . Nonischemic cardiomyopathy ([place]) 10/06/2020  . Cryptogenic stroke ([place]) 10/06/2020  . Acute on chronic combined systolic and diastolic CHF (congestive heart failure) ([place]) 07/17/2020  . Renal cell carcinoma ([place]) 07/17/2020  . Protein-calorie malnutrition, severe 07/08/2020  . Acute respiratory failure with hypoxia ([place]) 07/05/2020  . Iron deficiency anemia due to chronic blood loss 06/19/2020  . OSA (obstructive sleep apnea) 01/09/2018  . Pulmonary nodule, left 03/15/2017  . Former smoker 11/04/2014  . Insomnia 05/27/2014  . Plantar wart of left foot 11/27/2010  . Type 2 diabetes mellitus with other specified complication ([place]) 80/01/3493  . PSA, INCREASED 09/15/2009  . Diverticulitis of colon 05/08/2009  . Asthma, moderate persistent 06/19/2008  . ANKLE PAIN, CHRONIC 06/05/2007  . Allergic rhinitis 05/17/2007  . ERECTILE DYSFUNCTION, SECONDARY TO MEDICATION 05/17/2007  . Hyperlipidemia 04/06/2007  . Anxiety state 04/06/2007  . Essential hypertension 04/06/2007  . Coronary artery disease involving native coronary artery of native heart without angina pectoris 04/06/2007    [facility] ,MS, CCC-SLP  01/07/2021, 8:37 AM  [facility] [address] [place], [place], [address] Phone: [phone]   Fax:  [phone]   Name:  JAFAR POFFENBERGER MRN: [id number] Date of Birth: [birth year]

## 2021-01-08 ENCOUNTER — Encounter: Payer: Self-pay | Admitting: Cardiology

## 2021-01-08 ENCOUNTER — Ambulatory Visit (INDEPENDENT_AMBULATORY_CARE_PROVIDER_SITE_OTHER): Payer: BC Managed Care – PPO | Admitting: Cardiology

## 2021-01-08 ENCOUNTER — Other Ambulatory Visit: Payer: Self-pay

## 2021-01-08 VITALS — BP 110/50 | HR 79 | Ht 71.0 in | Wt 149.5 lb

## 2021-01-08 DIAGNOSIS — I255 Ischemic cardiomyopathy: Secondary | ICD-10-CM | POA: Diagnosis not present

## 2021-01-08 DIAGNOSIS — I2510 Atherosclerotic heart disease of native coronary artery without angina pectoris: Secondary | ICD-10-CM

## 2021-01-08 DIAGNOSIS — E785 Hyperlipidemia, unspecified: Secondary | ICD-10-CM

## 2021-01-08 DIAGNOSIS — I10 Essential (primary) hypertension: Secondary | ICD-10-CM

## 2021-01-08 DIAGNOSIS — C642 Malignant neoplasm of left kidney, except renal pelvis: Secondary | ICD-10-CM

## 2021-01-08 DIAGNOSIS — E1169 Type 2 diabetes mellitus with other specified complication: Secondary | ICD-10-CM

## 2021-01-08 DIAGNOSIS — Z8673 Personal history of transient ischemic attack (TIA), and cerebral infarction without residual deficits: Secondary | ICD-10-CM | POA: Diagnosis not present

## 2021-01-08 NOTE — Patient Instructions (Addendum)
Medication Instructions:  Your physician recommends that you continue on your current medications as directed. Please refer to the Current Medication list given to you today.  *If you need a refill on your cardiac medications before your next appointment, please call your pharmacy*   Lab Work: NONE If you have labs (blood work) drawn today and your tests are completely normal, you will receive your results only by: Marland Kitchen MyChart Message (if you have MyChart) OR . A paper copy in the mail If you have any lab test that is abnormal or we need to change your treatment, we will call you to review the results.   Testing/Procedures: NONE   Follow-Up: At [facility], you and your health needs are our priority.  As part of our continuing mission to provide you with exceptional heart care, we have created designated Provider Care Teams.  These Care Teams include your primary Cardiologist (physician) and Advanced Practice Providers (APPs -  Physician Assistants and Nurse Practitioners) who all work together to provide you with the care you need, when you need it.  We recommend signing up for the patient portal called "MyChart".  Sign up information is provided on this After Visit Summary.  MyChart is used to connect with patients for Virtual Visits (Telemedicine).  Patients are able to view lab/test results, encounter notes, upcoming appointments, etc.  Non-urgent messages can be sent to your provider as well.   To learn more about what you can do with MyChart, go to [email or web address].    Your next appointment:   December 5TH AT 11 PM  The format for your next appointment:   In Person  Provider:   Jenkins Rouge, MD

## 2021-01-09 ENCOUNTER — Ambulatory Visit: Payer: BC Managed Care – PPO | Attending: Family Medicine

## 2021-01-09 DIAGNOSIS — R4701 Aphasia: Secondary | ICD-10-CM | POA: Diagnosis present

## 2021-01-09 DIAGNOSIS — R41841 Cognitive communication deficit: Secondary | ICD-10-CM | POA: Diagnosis present

## 2021-01-09 NOTE — Therapy (Signed)
[facility] [address] [place], [place], [address] Phone: [phone]   Fax:  [phone]  Speech Language Pathology Treatment  Patient Details  Name: Larry Garner MRN: [id number] Date of Birth: Aug 19, 1955 Referring Provider (SLP): Teresa Coombs, [place]   Encounter Date: 01/09/2021   End of Session - 01/09/21 1315    Visit Number 19    Number of Visits 25    Date for SLP Re-Evaluation 03/10/21    SLP Start Time 1105    SLP Stop Time  1148    SLP Time Calculation (min) 43 min    Activity Tolerance Patient tolerated treatment well           Past Medical History:  Diagnosis Date  . Allergic rhinitis   . Allergy   . Anxiety   . Asthma   . CHF (congestive heart failure) ([place])   . Diabetes ([place])   . Diverticulitis   . Dyspnea   . GERD (gastroesophageal reflux disease)    very occ  . History of heart attack   . HLD (hyperlipidemia)   . HTN (hypertension)   . Myocardial infarction ([place]) 2000  . Sleep apnea    wears cpap   . Tubular adenoma of colon 09/2008    Past Surgical History:  Procedure Laterality Date  . CARDIAC CATHETERIZATION N/A 07/20/2016   Procedure: Left Heart Cath and Cors/Grafts Angiography;  Surgeon: Belva Crome, MD;  Location: [facility];  Service: Cardiovascular;  Laterality: N/A;  . CATARACT EXTRACTION Bilateral 06/06/2019   Dr. Tommy Rainwater. oct left, dec right 2020   . COLONOSCOPY    . CORONARY ANGIOPLASTY    . CORONARY ARTERY BYPASS GRAFT  06/1999  . POLYPECTOMY    . RENAL BIOPSY  06/2020  . TONSILLECTOMY     late50's early 60's    There were no vitals filed for this visit.   Subjective Assessment - 01/09/21 1257    Subjective Pt entered today with his phone to practice usage of some basic functions with SLP    Patient is accompained by: Family member   Larry Garner                ADULT SLP TREATMENT - 01/09/21 1258      General Information    Behavior/Cognition Alert;Cooperative;Pleasant mood;Confused      Treatment Provided   Treatment provided Cognitive-Linquistic      Cognitive-Linquistic Treatment   Treatment focused on Aphasia;Cognition    Skilled Treatment SLP used pt's phone to have him verbalize and read instrucions for basic calling; getting into phone app, ID Garner contact info, ID icon for calling in order to call Garner. Initially pt perseverated on "messaging" icon even with mod A, so SLP eliminated messaging from his quick access icons at the bottom of every home screen and placed it above. Pt then targeted phone icon 50% of the time when asked to do so. He also cont'd to try to touch screen to wake up phone prior to pressing power button. Garner and SLP disussed which would be easier for pt - to access hands free to ask to call desired person, or to turn phone on, access phone app, ID Garner contact info and then ID phone icon in order to call. Decided on second option. SLP assisted pt comprehension of who to call by taking picture of Larry Garner to put as her contact picture. Pt's access of her contact card improved after this. Larry Garner to  cont to work with pt at home with phone. Considering adding son's picture to his contact information as well- SLP told Garner if pt seems confused after 8-10 trials with two pictures it would be best just to leave the picture of the person he calls or needs to call most - Larry Garner.      Assessment / Recommendations / Plan   Plan Continue with current plan of care      Progression Toward Goals   Progression toward goals Not progressing toward goals (comment)   severity           SLP Education - 01/09/21 1306    Education Details accessing phone    Person(s) Educated Patient;Spouse    Methods Explanation;Demonstration;Verbal cues    Comprehension Verbalized understanding;Returned demonstration;Verbal cues required;Need further instruction            SLP Short Term Goals - 10/27/20 1148      SLP  SHORT TERM GOAL #1   Title pt will name 8 items in abstract categories in 5/6 opportunities in 3 sessions    Status Partially Met      SLP [place] #2   Title pt will describe to SLP 3 anomia compensations    Status Deferred      SLP SHORT TERM GOAL #3   Title pt will verbally sequence pertinent routines for pt with modified independence in 3 sessions    Status Partially Met      SLP SHORT TERM GOAL #4   Title pt will participate functionally in 10 minutes mod complex conversation using compensations for anomia successfully    Baseline 10-15-20    Status Achieved      SLP SHORT TERM GOAL #5   Title pt will participate in further testing as necessary (WAB-R or if pt desires, CLQT)    Period --   or 5 total sessions   Status Deferred            SLP Long Term Goals - 01/09/21 1318      SLP LONG TERM GOAL #1   Title pt will participate functionally in 20 minutes mod complex/complex conversation using compensations for anomia successfully in 3 sessions    Baseline 10-29-20, 11-03-20    Period --   or 17 total sessions, for all LTGs   Status Achieved      SLP LONG TERM GOAL #2   Title pt will express numbers with 100% success with self correction in 3 sessions    Baseline 11-03-20, 11-05-20    Status Achieved      SLP LONG TERM GOAL #3   Title pt will listen to a 20 minute discourse (lecture, TED talk, etc) and functionally give a verbal synopsis with compensations (notes, anomia compensations, etc) in 3 sessions    Baseline 11-03-20    Time 4    Period Weeks   or 25 total visits for LTGs 3 and 4   Status On-going      SLP LONG TERM GOAL #4   Title pt will participate functionally in 20 minutes of mod complex/complex conversation using his compensations for anomia    Time 3    Period Weeks    Status On-going      SLP LONG TERM GOAL #5   Title pt will functionally describe the steps to compensate for basic phone usage and to find appointments with his phone, using  written cues    Time 3    Period Weeks  Status On-going            Plan - 01/09/21 1315    Clinical Impression Statement Larry Garner expressed today he would like to work on compensations for tracking his appointments and for basic functions with his phone. Given pt performance today accessing mychart is not an attainable goal. Garner agrees and will cont to have pt use paper calendar and review with him daily/twice daily. SLP spent today working with pt in using written language to access his phone function. He req'd max A, even with repetition. It appeared that very Larry was carrying over from previous trials of accessing Larry Garner's contact info in the phone app to call Larry Garner. Pt's success in therapy is very inconsistent at this time - today pt req'd max cues consistently for accessing simple phone features, even with repetition. Plan is to d/c ST following next two sessions.    Speech Therapy Frequency 2x / week    Duration 4 weeks   17   Treatment/Interventions Language facilitation;Cueing hierarchy;SLP instruction and feedback;Compensatory strategies;Patient/family education;Internal/external aids;Cognitive reorganization;Multimodal communcation approach;Functional tasks    Potential to Achieve Goals Good           Patient will benefit from skilled therapeutic intervention in order to improve the following deficits and impairments:   Aphasia  Cognitive communication deficit    Problem List Patient Active Problem List   Diagnosis Date Noted  . Nonischemic cardiomyopathy ([place]) 10/06/2020  . Cryptogenic stroke ([place]) 10/06/2020  . Acute on chronic combined systolic and diastolic CHF (congestive heart failure) ([place]) 07/17/2020  . Renal cell carcinoma ([place]) 07/17/2020  . Protein-calorie malnutrition, severe 07/08/2020  . Acute respiratory failure with hypoxia ([place]) 07/05/2020  . Iron deficiency anemia due to chronic blood loss 06/19/2020  . OSA (obstructive sleep apnea) 01/09/2018  .  Pulmonary nodule, left 03/15/2017  . Former smoker 11/04/2014  . Insomnia 05/27/2014  . Plantar wart of left foot 11/27/2010  . Type 2 diabetes mellitus with other specified complication ([place]) 21/22/4825  . PSA, INCREASED 09/15/2009  . Diverticulitis of colon 05/08/2009  . Asthma, moderate persistent 06/19/2008  . ANKLE PAIN, CHRONIC 06/05/2007  . Allergic rhinitis 05/17/2007  . ERECTILE DYSFUNCTION, SECONDARY TO MEDICATION 05/17/2007  . Hyperlipidemia 04/06/2007  . Anxiety state 04/06/2007  . Essential hypertension 04/06/2007  . Coronary artery disease involving native coronary artery of native heart without angina pectoris 04/06/2007    [facility] ,MS, CCC-SLP  01/09/2021, 1:20 PM  [facility] [address] [place], [place], [address] Phone: [phone]   Fax:  [phone]   Name: Larry Garner MRN: [id number] Date of Birth: 1956-01-13

## 2021-01-12 ENCOUNTER — Ambulatory Visit: Payer: BC Managed Care – PPO

## 2021-01-12 ENCOUNTER — Other Ambulatory Visit: Payer: Self-pay

## 2021-01-12 DIAGNOSIS — R4701 Aphasia: Secondary | ICD-10-CM

## 2021-01-12 DIAGNOSIS — R41841 Cognitive communication deficit: Secondary | ICD-10-CM

## 2021-01-12 NOTE — Therapy (Signed)
[facility] [address] [place], [place], [address] Phone: [phone]   Fax:  [phone]  Speech Language Pathology Treatment/Progress note  Patient Details  Name: Larry Garner MRN: [id number] Date of Birth: 1956-06-22 Referring Provider (SLP): Teresa Coombs, [place]   Encounter Date: 01/12/2021   End of Session - 01/12/21 1204    Visit Number 20    Number of Visits 25    Date for SLP Re-Evaluation 03/10/21    SLP Start Time 1104    SLP Stop Time  1142    SLP Time Calculation (min) 38 min    Activity Tolerance Patient tolerated treatment well           Past Medical History:  Diagnosis Date  . Allergic rhinitis   . Allergy   . Anxiety   . Asthma   . CHF (congestive heart failure) ([place])   . Diabetes ([place])   . Diverticulitis   . Dyspnea   . GERD (gastroesophageal reflux disease)    very occ  . History of heart attack   . HLD (hyperlipidemia)   . HTN (hypertension)   . Myocardial infarction ([place]) 2000  . Sleep apnea    wears cpap   . Tubular adenoma of colon 09/2008    Past Surgical History:  Procedure Laterality Date  . CARDIAC CATHETERIZATION N/A 07/20/2016   Procedure: Left Heart Cath and Cors/Grafts Angiography;  Surgeon: Belva Crome, MD;  Location: [facility];  Service: Cardiovascular;  Laterality: N/A;  . CATARACT EXTRACTION Bilateral 06/06/2019   Dr. Tommy Rainwater. oct left, dec right 2020   . COLONOSCOPY    . CORONARY ANGIOPLASTY    . CORONARY ARTERY BYPASS GRAFT  06/1999  . POLYPECTOMY    . RENAL BIOPSY  06/2020  . TONSILLECTOMY     late50's early 60's    There were no vitals filed for this visit.  Speech Therapy Progress Note  Dates of Reporting Period: 11-03-20 to present  Subjective Statement: Pt has been seen for 20 ST visits focusing on language and cognition.  Objective: See below.  Goal Update: See below.  Plan: d/c next session due to meeting current rehab  potential.  Reason Skilled Services are Required: Pt/wife to think of final questions to ask SLP for final skilled ST session this week.    Subjective Assessment - 01/12/21 1130    Subjective Pt entered today with his phone to practice usage of basic calling. Pt/[name] were able to practice over the weekend.    Patient is accompained by: Family member   [name]-wife   Currently in Pain? No/denies                 ADULT SLP TREATMENT - 01/12/21 1157      General Information   Behavior/Cognition Alert;Cooperative;Pleasant mood;Confused      Treatment Provided   Treatment provided Cognitive-Linquistic      Cognitive-Linquistic Treatment   Treatment focused on Aphasia;Cognition    Skilled Treatment SLP addressed pt's ability to comprehend written language using his phone to successfully call pt wife, pt son, and pt daughter in law. He req'd min-mod cues occasionally for accuracy for comprehension of target contact person - definitely improved since last session. Pt nor wife had further questions for SLP so pt left early at 1142. Pt's wife to add the remaining two pictures for contacts pt might need to call, and will assist pt in deleting contacts pt has not called in 6  months in order to simplify his contact list due to pt req'ing total A when he accidentally scrolled down into his contact list from his "pinned" contacts.      Assessment / Recommendations / Plan   Plan Continue with current plan of care   discharge next session due to reaching current max potential     Progression Toward Goals   Progression toward goals Not progressing toward goals (comment)   severity           SLP Education - 01/12/21 1204    Education Details accessing phone, deleting old contacts    Person(s) Educated Patient;Spouse    Methods Explanation;Demonstration;Verbal cues    Comprehension Verbalized understanding;Returned demonstration;Verbal cues required;Need further instruction            SLP  Short Term Goals - 10/27/20 1148      SLP [place] #1   Title pt will name 8 items in abstract categories in 5/6 opportunities in 3 sessions    Status Partially Met      SLP [place] #2   Title pt will describe to SLP 3 anomia compensations    Status Deferred      SLP SHORT TERM GOAL #3   Title pt will verbally sequence pertinent routines for pt with modified independence in 3 sessions    Status Partially Met      SLP SHORT TERM GOAL #4   Title pt will participate functionally in 10 minutes mod complex conversation using compensations for anomia successfully    Baseline 10-15-20    Status Achieved      SLP SHORT TERM GOAL #5   Title pt will participate in further testing as necessary (WAB-R or if pt desires, CLQT)    Period --   or 5 total sessions   Status Deferred            SLP Long Term Goals - 01/12/21 1206      SLP LONG TERM GOAL #1   Title pt will participate functionally in 20 minutes mod complex/complex conversation using compensations for anomia successfully in 3 sessions    Baseline 10-29-20, 11-03-20    Period --   or 17 total sessions, for all LTGs   Status Achieved      SLP LONG TERM GOAL #2   Title pt will express numbers with 100% success with self correction in 3 sessions    Baseline 11-03-20, 11-05-20    Status Achieved      SLP LONG TERM GOAL #3   Title pt will listen to a 20 minute discourse (lecture, TED talk, etc) and functionally give a verbal synopsis with compensations (notes, anomia compensations, etc) in 3 sessions    Baseline 11-03-20    Period --   or 25 total visits for LTGs 3 and 4   Status Deferred      SLP LONG TERM GOAL #4   Title pt will participate functionally in 20 minutes of mod complex/complex conversation using his compensations for anomia    Status Deferred      SLP LONG TERM GOAL #5   Title pt will functionally use phone function on his mobile device, using written cues    Time 2    Period Weeks    Status  On-going            Plan - 01/12/21 1204    Clinical Impression Statement SLP spent today working with pt in comprehending written language to access  his phone function. He req'd less A than previous session.  Pt's success in therapy is very inconsistent at this time - today pt req'd max cues consistently for accessing simple phone features, even with repetition. Plan is to d/c ST following next session. Pt has reached his max current rehab potential.    Speech Therapy Frequency 2x / week    Duration 4 weeks   17   Treatment/Interventions Language facilitation;Cueing hierarchy;SLP instruction and feedback;Compensatory strategies;Patient/family education;Internal/external aids;Cognitive reorganization;Multimodal communcation approach;Functional tasks    Potential to Achieve Goals Good           Patient will benefit from skilled therapeutic intervention in order to improve the following deficits and impairments:   Aphasia  Cognitive communication deficit    Problem List Patient Active Problem List   Diagnosis Date Noted  . Nonischemic cardiomyopathy ([place]) 10/06/2020  . Cryptogenic stroke ([place]) 10/06/2020  . Acute on chronic combined systolic and diastolic CHF (congestive heart failure) ([place]) 07/17/2020  . Renal cell carcinoma ([place]) 07/17/2020  . Protein-calorie malnutrition, severe 07/08/2020  . Acute respiratory failure with hypoxia ([place]) 07/05/2020  . Iron deficiency anemia due to chronic blood loss 06/19/2020  . OSA (obstructive sleep apnea) 01/09/2018  . Pulmonary nodule, left 03/15/2017  . Former smoker 11/04/2014  . Insomnia 05/27/2014  . Plantar wart of left foot 11/27/2010  . Type 2 diabetes mellitus with other specified complication ([place]) 16/05/9603  . PSA, INCREASED 09/15/2009  . Diverticulitis of colon 05/08/2009  . Asthma, moderate persistent 06/19/2008  . ANKLE PAIN, CHRONIC 06/05/2007  . Allergic rhinitis 05/17/2007  . ERECTILE DYSFUNCTION, SECONDARY TO  MEDICATION 05/17/2007  . Hyperlipidemia 04/06/2007  . Anxiety state 04/06/2007  . Essential hypertension 04/06/2007  . Coronary artery disease involving native coronary artery of native heart without angina pectoris 04/06/2007    [facility] ,[place], CCC-SLP  01/12/2021, 12:07 PM  [facility] [address] [place], [place], [address] Phone: [phone]   Fax:  [phone]   Name: UGONNA KEIRSEY MRN: [id number] Date of Birth: [birth year]

## 2021-01-13 NOTE — Progress Notes (Signed)
Neuropsychology Note  DELYLE WEIDER completed 240 minutes of neuropsychological testing with technician, Dina Rich, BA, under the supervision of Ilean Skill, PsyD., Clinical Neuropsychologist. The patient did not appear overtly distressed by the testing session, per behavioral observation or via self-report to the technician. Rest breaks were offered.   Clinical Decision Making: In considering the patient's current level of functioning, level of presumed impairment, nature of symptoms, emotional and behavioral responses during clinical interview, level of literacy, and observed level of motivation/effort, a battery of tests was selected by Dr. Sima Matas during initial consultation on 11/25/20 . This was communicated to the technician. Communication between the neuropsychologist and technician was ongoing throughout the testing session and changes were made as deemed necessary based on patient performance on testing, technician observations and additional pertinent factors such as those listed above.  Tests Administered: . Wechsler Adult Intelligence Scale, 4th Edition (WAIS-IV) . Wechsler Memory Scale, 4th Edition (WMS-IV); Adult Battery    Results: Composite Score Summary  Scale Sum of Scaled Scores Composite Score Percentile Rank 95% Conf. Interval Qualitative Description  Verbal Comprehension 25 VCI 91 27 86-97 Average  Perceptual Reasoning 9 PRI 58 0.3 54-66 Extremely Low  Working Memory 3 WMI 53 0.1 49-63 Extremely Low  Processing Speed 2 PSI 50 <0.1 47-63 Extremely Low  Full Scale 39 FSIQ 59 0.3 56-64 Extremely Low  General Ability 34 GAI 73 4 69-79 Borderline    Verbal Comprehension Subtests Summary  Subtest Raw Score Scaled Score Percentile Rank Reference Group Scaled Score SEM  Similarities 18 7 16 6  1.04  Vocabulary 35 9 37 10 0.67  Information 12 9 37 9 0.73  (Comprehension) 16 7 16 6  1.08        Perceptual Reasoning Subtests Summary  Subtest Raw Score  Scaled Score Percentile Rank Reference Group Scaled Score SEM  Block Design 5 2 0.4 1 1.08  Matrix Reasoning 5 5 5 2  0.90  Visual Puzzles 2 2 0.4 1 0.85  (Figure Weights) 0 1 0.1 1 0.95  (Picture Completion) 2 3 1 1  1.16        Working Doctor, general practice Raw Score Scaled Score Percentile Rank Reference Group Scaled Score SEM  Digit Span 8 1 0.1 1 0.79  Arithmetic 5 2 0.4 3 0.99  (Letter-Number Seq.) 6 1 0.1 1 1.04        Processing Speed Subtests Summary  Subtest Raw Score Scaled Score Percentile Rank Reference Group Scaled Score SEM  Symbol Search 4 1 0.1 1 1.31  Coding 6 1 0.1 1 0.99  (Cancellation) 0 1 0.1 1 1.34       Index Score Summary  Index Sum of Scaled Scores Index Score Percentile Rank 95% Confidence Interval Qualitative Descriptor  Auditory Memory (AMI) 8 49 <0.1 45-58 Extremely Low  Visual Memory (VMI) 20 69 2 65-76 Extremely Low  Visual Working Memory (VWMI) 5 52 0.1 48-63 Extremely Low  Immediate Memory (IMI) 13 54 0.1 50-63 Extremely Low  Delayed Memory (DMI) 15 58 0.3 54-67 Extremely Low     Primary Subtest Scaled Score Summary  Subtest Domain Raw Score Scaled Score Percentile Rank  Logical Memory I AM 8 2 0.4  Logical Memory II AM 5 3 1   Verbal Paired Associates I AM 3 2 0.4  Verbal Paired Associates II AM 0 1 0.1  Designs I VM 51 8 25  Designs II VM 48 10 50  Visual Reproduction I VM 12 1 0.1  Visual  Reproduction II VM 0 1 0.1  Spatial Addition VWM 3 4 2   Symbol Span VWM 0 1 0.1    Auditory Memory Process Score Summary  Process Score Raw Score Scaled Score Percentile Rank Cumulative Percentage (Base Rate)  LM II Recognition 22 - - 17-25%  VPA II Recognition 29 - - 3-9%       Visual Memory Process Score Summary  Process Score Raw Score Scaled Score Percentile Rank Cumulative Percentage (Base Rate)  DE I Content 33 10 50 -  DE I Spatial 12 8 25  -  DE II Content 35 12 75 -  DE II Spatial 9 8 25  -  DE II  Recognition 13 - - 26-50%  VR II Recognition 1 - - <=2%     ABILITY-MEMORY ANALYSIS  Ability Score:  VCI: 91 Date of Testing:  WAIS-IV; WMS-IV 2020/12/18  Predicted Difference Method   Index Predicted WMS-IV Index Score Actual WMS-IV Index Score Difference Critical Value  Significant Difference Y/N Base Rate  Auditory Memory 95 49 46 10.15 Y <1%  Visual Memory 96 69 27 9.30 Y 2-3%  Visual Working Memory 95 52 43 11.68 Y <1%  Immediate Memory 95 54 41 11.15 Y <1%  Delayed Memory 95 58 37 11.49 Y <1%  Statistical significance (critical value) at the .01 level.    Feedback to Patient: Larry Garner will return for an interactive feedback session with Dr. Sima Matas at which time his test performances, clinical impressions and treatment recommendations will be reviewed in detail. The patient understands he can contact our office should he require our assistance before this time.  240 minutes spent face-to-face with patient administering standardized tests, 30 minutes spent scoring Environmental education officer). [CPT [id number], [id number]]  Full report to follow.

## 2021-01-15 ENCOUNTER — Ambulatory Visit: Payer: BC Managed Care – PPO

## 2021-01-15 ENCOUNTER — Other Ambulatory Visit: Payer: Self-pay

## 2021-01-15 DIAGNOSIS — R4701 Aphasia: Secondary | ICD-10-CM | POA: Diagnosis not present

## 2021-01-15 DIAGNOSIS — R41841 Cognitive communication deficit: Secondary | ICD-10-CM

## 2021-01-15 NOTE — Therapy (Signed)
[facility] [address] [place], [place], [address] Phone: [phone]   Fax:  [phone]  Speech Language Pathology Treatment/Discharge Summary  Patient Details  Name: Larry Garner MRN: [id number] Date of Birth: [birth year] Referring Provider (SLP): Bea Laura Hart, [place]   Encounter Date: 01/15/2021   End of Session - 01/15/21 1246     Visit Number 21    Number of Visits 25    Date for SLP Re-Evaluation 03/10/21    SLP Start Time 35    SLP Stop Time  1100    SLP Time Calculation (min) 42 min    Activity Tolerance Patient tolerated treatment well             Past Medical History:  Diagnosis Date   Allergic rhinitis    Allergy    Anxiety    Asthma    CHF (congestive heart failure) ([place])    Diabetes ([place])    Diverticulitis    Dyspnea    GERD (gastroesophageal reflux disease)    very occ   History of heart attack    HLD (hyperlipidemia)    HTN (hypertension)    Myocardial infarction ([place]) 2000   Sleep apnea    wears cpap    Tubular adenoma of colon 09/2008    Past Surgical History:  Procedure Laterality Date   CARDIAC CATHETERIZATION N/A 07/20/2016   Procedure: Left Heart Cath and Cors/Grafts Angiography;  Surgeon: Belva Crome, MD;  Location: [facility];  Service: Cardiovascular;  Laterality: N/A;   CATARACT EXTRACTION Bilateral 06/06/2019   Dr. Tommy Rainwater. oct left, dec right 2020    COLONOSCOPY     CORONARY ANGIOPLASTY     CORONARY ARTERY BYPASS GRAFT  06/1999   POLYPECTOMY     RENAL BIOPSY  06/2020   TONSILLECTOMY     late50's early 60's    There were no vitals filed for this visit.   SPEECH THERAPY DISCHARGE SUMMARY  Visits from Start of Care: 21  Current functional level related to goals / functional outcomes: See below. Pt has generally maxed his rehab potential.   Remaining deficits: Cognitive communication deficit, aphasia.   Education / Equipment: Compensations  for cognitive-communication.     Plan: Patient agrees to discharge.  Patient goals were met. Patient is being discharged due to meeting the stated rehab goals/reached max potential.         Subjective Assessment - 01/15/21 1102     Subjective Pt did not have any questions for SLP.    Patient is accompained by: Family member   beth-wife                  ADULT SLP TREATMENT - 01/15/21 1102       General Information   Behavior/Cognition Alert;Cooperative;Pleasant mood;Confused      Treatment Provided   Treatment provided Cognitive-Linquistic      Cognitive-Linquistic Treatment   Treatment focused on Aphasia;Cognition    Skilled Treatment SLP had pt follow icon and word visual prompts on his phone to call wife, son, and dtr in law. Pt did so 6/6 times throughout session. Wife and pt have been practicing at home. SLP reiterated to pt and wife to cont practicing this daily. Additinoally, SLP encouraged pt and wife to cont to look at pt's schedule the afternoon prior - SLP suggested pt schedule 1-2 other things if he desires for the following day and then set alarm to complete the item. SLP encouraged  pt that he can call with concerns or questions after today.      Assessment / Recommendations / Plan   Plan Discharge SLP treatment due to (comment)      Progression Toward Goals   Progression toward goals --   d/c day - see goals             SLP Education - 01/15/21 1245     Education Details look at calendar and schedule something next day if desired    Person(s) Educated Patient;Spouse    Methods Explanation    Comprehension Verbalized understanding              SLP Short Term Goals - 10/27/20 1148       SLP SHORT TERM GOAL #1   Title pt will name 8 items in abstract categories in 5/6 opportunities in 3 sessions    Status Partially Met      SLP [place] #2   Title pt will describe to SLP 3 anomia compensations    Status Deferred      SLP  SHORT TERM GOAL #3   Title pt will verbally sequence pertinent routines for pt with modified independence in 3 sessions    Status Partially Met      SLP SHORT TERM GOAL #4   Title pt will participate functionally in 10 minutes mod complex conversation using compensations for anomia successfully    Baseline 10-15-20    Status Achieved      SLP SHORT TERM GOAL #5   Title pt will participate in further testing as necessary (WAB-R or if pt desires, CLQT)    Period --   or 5 total sessions   Status Deferred              SLP Long Term Goals - 01/15/21 1249       SLP LONG TERM GOAL #1   Title pt will participate functionally in 20 minutes mod complex/complex conversation using compensations for anomia successfully in 3 sessions    Baseline 10-29-20, 11-03-20    Period --   or 17 total sessions, for all LTGs   Status Achieved      SLP LONG TERM GOAL #2   Title pt will express numbers with 100% success with self correction in 3 sessions    Baseline 11-03-20, 11-05-20    Status Achieved      SLP LONG TERM GOAL #3   Title pt will listen to a 20 minute discourse (lecture, TED talk, etc) and functionally give a verbal synopsis with compensations (notes, anomia compensations, etc) in 3 sessions    Baseline 11-03-20    Period --   or 25 total visits for LTGs 3 and 4   Status Deferred      SLP LONG TERM GOAL #4   Title pt will participate functionally in 20 minutes of mod complex/complex conversation using his compensations for anomia    Status Deferred      SLP LONG TERM GOAL #5   Title pt will functionally use phone function on his mobile device, using written cues    Status Achieved              Plan - 01/15/21 1247     Clinical Impression Statement SLP spent today continuing to work with pt in using icons and words to access his phone function. He did much better today with accessing phone correcly.  Plan is to d/c ST following next session. Pt has  reached his max current rehab  potential.    Treatment/Interventions Language facilitation;Cueing hierarchy;SLP instruction and feedback;Compensatory strategies;Patient/family education;Internal/external aids;Cognitive reorganization;Multimodal communcation approach;Functional tasks    Potential to Achieve Goals Good             Patient will benefit from skilled therapeutic intervention in order to improve the following deficits and impairments:   Aphasia  Cognitive communication deficit    Problem List Patient Active Problem List   Diagnosis Date Noted   Nonischemic cardiomyopathy ([place]) 10/06/2020   Cryptogenic stroke ([place]) 10/06/2020   Acute on chronic combined systolic and diastolic CHF (congestive heart failure) ([place]) 07/17/2020   Renal cell carcinoma ([place]) 07/17/2020   Protein-calorie malnutrition, severe 07/08/2020   Acute respiratory failure with hypoxia ([place]) 07/05/2020   Iron deficiency anemia due to chronic blood loss 06/19/2020   OSA (obstructive sleep apnea) 01/09/2018   Pulmonary nodule, left 03/15/2017   Former smoker 11/04/2014   Insomnia 05/27/2014   Plantar wart of left foot 11/27/2010   Type 2 diabetes mellitus with other specified complication ([place]) 28/78/6767   PSA, INCREASED 09/15/2009   Diverticulitis of colon 05/08/2009   Asthma, moderate persistent 06/19/2008   ANKLE PAIN, CHRONIC 06/05/2007   Allergic rhinitis 05/17/2007   ERECTILE DYSFUNCTION, SECONDARY TO MEDICATION 05/17/2007   Hyperlipidemia 04/06/2007   Anxiety state 04/06/2007   Essential hypertension 04/06/2007   Coronary artery disease involving native coronary artery of native heart without angina pectoris 04/06/2007    [facility] ,MS, CCC-SLP  01/15/2021, 12:50 PM  [facility] [address] [place], [place], [address] Phone: [phone]   Fax:  [phone]   Name: Larry Garner MRN: [id number] Date of Birth: May 08, 1956

## 2021-01-16 ENCOUNTER — Telehealth: Payer: Self-pay | Admitting: Cardiovascular Disease

## 2021-01-16 NOTE — Telephone Encounter (Signed)
Called and spoke to patient's wife to inform that we received disability forms and will need $29 fee and authorization to complete the forms.  AO 01/16/21

## 2021-01-21 NOTE — Telephone Encounter (Signed)
Patient's wife came in to make $29 payment, the patient signed the authorization, HIM matched the signature with the signature that is on file. The form has been placed in Dr. Kyla Balzarine box to be completed. AO 01/21/21

## 2021-01-28 ENCOUNTER — Ambulatory Visit (INDEPENDENT_AMBULATORY_CARE_PROVIDER_SITE_OTHER): Payer: BC Managed Care – PPO | Admitting: Family Medicine

## 2021-01-28 ENCOUNTER — Encounter: Payer: Self-pay | Admitting: Family Medicine

## 2021-01-28 ENCOUNTER — Other Ambulatory Visit: Payer: Self-pay

## 2021-01-28 VITALS — BP 130/80 | HR 83 | Temp 98.3°F | Ht 71.0 in | Wt 148.0 lb

## 2021-01-28 DIAGNOSIS — I5042 Chronic combined systolic (congestive) and diastolic (congestive) heart failure: Secondary | ICD-10-CM

## 2021-01-28 DIAGNOSIS — E1169 Type 2 diabetes mellitus with other specified complication: Secondary | ICD-10-CM | POA: Diagnosis not present

## 2021-01-28 DIAGNOSIS — C642 Malignant neoplasm of left kidney, except renal pelvis: Secondary | ICD-10-CM | POA: Diagnosis not present

## 2021-01-28 DIAGNOSIS — I2510 Atherosclerotic heart disease of native coronary artery without angina pectoris: Secondary | ICD-10-CM

## 2021-01-28 DIAGNOSIS — I255 Ischemic cardiomyopathy: Secondary | ICD-10-CM

## 2021-01-28 DIAGNOSIS — E785 Hyperlipidemia, unspecified: Secondary | ICD-10-CM

## 2021-01-28 DIAGNOSIS — I10 Essential (primary) hypertension: Secondary | ICD-10-CM

## 2021-01-28 NOTE — Addendum Note (Signed)
Addended by: Brandy Hale on: 01/28/2021 10:55 AM   Modules accepted: Orders

## 2021-01-28 NOTE — Patient Instructions (Addendum)
Schedule lab visit on 03/15/21 or later. No labs today.   You agreed to do your exercises 2 additional days a week on top of seeing the trainer twice a week. Lets get you stronger and ready for surgery.   Recommended follow up: Return in about 3 months (around 04/30/2021) for follow up- or sooner if needed.

## 2021-01-28 NOTE — Progress Notes (Signed)
Phone [phone] In person visit   Subjective:   Larry Garner is a 65 y.o. year old very pleasant male patient who presents for/with See problem oriented charting Chief Complaint  Patient presents with   Diabetes    This visit occurred during the SARS-CoV-2 public health emergency.  Safety protocols were in place, including screening questions prior to the visit, additional usage of staff PPE, and extensive cleaning of exam room while observing appropriate contact time as indicated for disinfecting solutions.   Past Medical History-  Patient Active Problem List   Diagnosis Date Noted   Cryptogenic stroke ([place]) 10/06/2020    Priority: High   Chronic combined systolic and diastolic heart failure ([place]) 07/17/2020    Priority: High   Renal cell carcinoma ([place]) 07/17/2020    Priority: High   Type 2 diabetes mellitus with other specified complication ([place]) 76/28/3151    Priority: High   Coronary artery disease involving native coronary artery of native heart without angina pectoris 04/06/2007    Priority: High   OSA (obstructive sleep apnea) 01/09/2018    Priority: Medium   Pulmonary nodule, left 03/15/2017    Priority: Medium   Insomnia 05/27/2014    Priority: Medium   Asthma, moderate persistent 06/19/2008    Priority: Medium   ERECTILE DYSFUNCTION, SECONDARY TO MEDICATION 05/17/2007    Priority: Medium   Hyperlipidemia 04/06/2007    Priority: Medium   Anxiety state 04/06/2007    Priority: Medium   Essential hypertension 04/06/2007    Priority: Medium   Former smoker 11/04/2014    Priority: Low   Plantar wart of left foot 11/27/2010    Priority: Low   PSA, INCREASED 09/15/2009    Priority: Low   Diverticulitis of colon 05/08/2009    Priority: Low   ANKLE PAIN, CHRONIC 06/05/2007    Priority: Low   Allergic rhinitis 05/17/2007    Priority: Low   Nonischemic cardiomyopathy ([place]) 10/06/2020   Protein-calorie malnutrition, severe 07/08/2020   Acute respiratory  failure with hypoxia ([place]) 07/05/2020   Iron deficiency anemia due to chronic blood loss 06/19/2020    Medications- reviewed and updated Current Outpatient Medications  Medication Sig Dispense Refill   albuterol (VENTOLIN HFA) 108 (90 Base) MCG/ACT inhaler Inhale 2 puffs into the lungs every 6 (six) hours as needed for wheezing or shortness of breath. 18 g 11   apixaban (ELIQUIS) 5 MG TABS tablet Take 1 tablet (5 mg total) by mouth in the morning and at bedtime. 180 tablet 3   Azelastine-Fluticasone 137-50 MCG/ACT SUSP Place 1 spray into the nose daily as needed (allergies).     bisoprolol (ZEBETA) 10 MG tablet TAKE 1 TABLET BY MOUTH EVERY DAY 90 tablet 3   Blood Glucose Monitoring Suppl (CONTOUR NEXT ONE) KIT Use to test blood sugar daily. Dx: E11.9 1 kit 3   cyanocobalamin 1000 MCG tablet Take 1,000 mcg by mouth daily.     empagliflozin (JARDIANCE) 10 MG TABS tablet Take 1 tablet (10 mg total) by mouth daily before breakfast. 30 tablet 0   famciclovir (FAMVIR) 500 MG tablet TAKE 3 TABLETS BY MOUTH EVERY DAY AS NEEDED AT FIRST SIGN OF OUTBREAK OF FEVER BLISTER 15 tablet 1   furosemide (LASIX) 40 MG tablet Take 1 tablet (40 mg total) by mouth daily. 90 tablet 3   glimepiride (AMARYL) 2 MG tablet Take 2 mg by mouth as needed. Only use if blood sugar is over 110     glucose blood (BAYER CONTOUR NEXT  TEST) test strip Use to test blood sugars daily. Dx: E11.9 100 each 12   metFORMIN (GLUCOPHAGE) 500 MG tablet Take 1 tablet (500 mg total) by mouth 2 (two) times daily with a meal. No print 1 tablet 0   Multiple Vitamin (MULTIVITAMIN) tablet Take 1 tablet by mouth daily.     nitroGLYCERIN (NITROSTAT) 0.4 MG SL tablet PLACE 1 TABLET UNDER THE TONGUE EVERY 5 MINUTES AS NEEDED FOR CHEST PAIN. 25 tablet 1   predniSONE (DELTASONE) 5 MG tablet Take 1 tablet by mouth daily.     rosuvastatin (CRESTOR) 20 MG tablet TAKE 1 TABLET BY MOUTH DAILY 90 tablet 4   sacubitril-valsartan (ENTRESTO) 49-51 MG Take 1  tablet by mouth 2 (two) times daily. 60 tablet 11   spironolactone (ALDACTONE) 25 MG tablet Take 0.5 tablets (12.5 mg total) by mouth daily. 45 tablet 3   triamcinolone cream (KENALOG) 0.1 % Apply 1 application topically 2 (two) times daily.     ALPRAZolam (XANAX) 0.5 MG tablet Take 1 tablet (0.5 mg total) by mouth every 6 (six) hours as needed. (Patient not taking: Reported on 01/28/2021) 30 tablet 3   hydrOXYzine (ATARAX/VISTARIL) 25 MG tablet Take 25 mg by mouth 3 (three) times daily as needed. (Patient not taking: Reported on 01/28/2021)     oxymetazoline (AFRIN) 0.05 % nasal spray Place 1 spray into both nostrils 2 (two) times daily as needed for congestion. (Patient not taking: Reported on 01/28/2021)     zaleplon (SONATA) 10 MG capsule Take 1 capsule (10 mg total) by mouth at bedtime as needed. for sleep (Patient not taking: Reported on 01/28/2021) 30 capsule 5   No current facility-administered medications for this visit.     Objective:  BP 130/80   Pulse 83   Temp 98.3 F (36.8 C) (Temporal)   Ht _0  (1.803 m)   Wt 148 lb (67.1 kg)   SpO2 97%   BMI 20.64 kg/m  Gen: NAD, resting comfortably CV: RRR no murmurs rubs or gallops Lungs: CTAB no crackles, wheeze, rhonchi Abdomen: soft/nontender/nondistended/normal bowel sounds.  Ext:  trace to 1+ Skin: warm, dry     Assessment and Plan   #Renal cell carcinoma left kidney-continue to follow closely with oncology and nephrology.  Currently on immunotherapy -CT this afternoon. Plan is to do surgery 6 months to a year after immunotherapy per most recent report.  -still itching with Keytruda  # Diabetes #Former weight loss now improving S: Medication:Jardiance 10 mg (also needed for CHF), metformin 500 mg twice daily reduced from 1000 mg twice daily, glimepiride 2 mg if blood sugar over 111.  We had stopped Ozempic due to weight loss/poor appetite as he was down 30 pounds from December at last visit-I am thrilled to note he has  gained 7 pounds from last visit at this point!  At last visit patient was using [employer] with urinary 50 cal along with breakfast in addition to a milkshake and using whole milk -getting rails in the bathroom and steps outside door to garge  CBGs-last visit most blood sugars were between 90 and 139-today they report 70-154 for most part. Had one # this morning up to 182 (milkshake before bed). Also had one 61 but this was morning after received insulin at [facility] Exercise and diet- working with trainer 2 days a week Lab Results  Component Value Date   HGBA1C 6.8 (H) 12/12/2020   HGBA1C 7.1 (H) 10/13/2020   HGBA1C 6.4 (H) 07/05/2020  A/P:  hopefully stable- will come back for a1c at 3 months and 1 day- then determine if changes needed. I actually still want him to do higher calorie diet - and then try to counter some with exercise. If morning sugars get higher let us know. May adjust approach if a1c elevates  #CAD and also history of stroke thought to be on Eliquis-patient on Eliquis #hyperlipidemia S: Medication:Eliquis 5 mg twice daily (as a result not on aspirin), bisoprolol 10 mg, rosuvastatin 20 mg Lab Results  Component Value Date   CHOL 97 02/21/2020   HDL 27.60 (L) 02/21/2020   LDLCALC 34 02/21/2020   LDLDIRECT 54.0 01/16/2019   TRIG 175.0 (H) 02/21/2020   CHOLHDL 4 02/21/2020   A/P: CAD appears stable. Continue current medicines. Eliquis needed for stroke prevention  #Heart failure with reduced ejection fraction originally down to 30 to 35% and on repeat echocardiogram in May 2022 up to 35 to 40% #hypertension S: Medication: Bisoprolol 10 mg, Jardiance 10 mg, Lasix 40 mg, Entresto 49-51 mg, spironolactone 25 mg  Edema: trace to 1+ Weight gain:intentional Shortness of breath: declines Orthopnea/PND: denies A/P: Stable. Continue current medications.     #Falls- one at night around June 10th- cane was in wrong position and tripped him while using walker- mild back  pain thoracic since tha time. No midline pain. Had another flal on the 19th with walker being partially turned- causing fanto left elbow- slight laceration but overall doing well. Would avoid obstacles, try to keep walker straight  #some coughing with cold liquids. No issue warm or room temp. No sensation of trouble swallowing/pain with swallowing. Has been going on since stroke- could consider swallow study but declines  # lower motivation/some sadness- July 12th has appointment with therapist at [facility] . I think depression could be playing a role in some of lack of motivation.  - if not improving could consider meds but wants to reduce med burden if possible - limit naps to 20 mins in daytime  Recommended follow up: Return in about 3 months (around 04/30/2021) for follow up- or sooner if needed. Future Appointments  Date Time Provider [place]  07/10/2021 11:00 AM Josue Hector, MD [id number]    Lab/Order associations:   ICD-10-CM   1. Type 2 diabetes mellitus with other specified complication, without long-term current use of insulin (HCC)  E11.69 Hemoglobin A1c    CBC With Differential/Platelet    COMPLETE METABOLIC PANEL WITH GFR    Lipid panel    Lipid panel    COMPLETE METABOLIC PANEL WITH GFR    CBC With Differential/Platelet    Hemoglobin A1c    2. Renal cell carcinoma of left kidney (HCC)  C64.2     3. Coronary artery disease involving native coronary artery of native heart without angina pectoris  I25.10     4. Chronic combined systolic and diastolic heart failure (HCC)  I50.42     5. Essential hypertension  I10     6. Hyperlipidemia, unspecified hyperlipidemia type  E78.5       No orders of the defined types were placed in this encounter.   Return precautions advised.  [name], MD

## 2021-01-29 ENCOUNTER — Telehealth: Payer: Self-pay

## 2021-01-29 NOTE — Telephone Encounter (Signed)
**Note De-Identified  Obfuscation** I started a Eliquis PA through covermymeds. Key: [id number]

## 2021-01-30 ENCOUNTER — Ambulatory Visit: Payer: Medicare Other | Admitting: Psychology

## 2021-02-02 NOTE — Telephone Encounter (Signed)
**Note De-Identified  Obfuscation** [facility] [place] Key: [id number] - PA Case ID: [id number] Outcome: Approved on June 23 Type:Prior Auth;Coverage Start Date:12/30/2020;Coverage End Date:07/28/2021 Drug: Eliquis 5MG  tablets Form [employer] Electronic PA Form (2017 NCPDP)  I have notified [facility] of his approval.

## 2021-02-03 ENCOUNTER — Encounter: Payer: Self-pay | Admitting: Family Medicine

## 2021-02-04 MED ORDER — DESVENLAFAXINE SUCCINATE ER 25 MG PO TB24: 25.0000 mg | ORAL_TABLET | Freq: Every day | ORAL | 5 refills | Status: DC

## 2021-02-13 NOTE — Telephone Encounter (Addendum)
HIM Received form back from the nurse incomplete. Dr. Johnsie Cancel declined. HIM has called and spoke to the patient informing him of the decision and to inform him of the refund he can come to the office and obtain at his earliest convenience.  AO 02/13/21

## 2021-02-14 ENCOUNTER — Other Ambulatory Visit: Payer: Self-pay | Admitting: Family Medicine

## 2021-02-16 ENCOUNTER — Telehealth: Payer: Self-pay

## 2021-02-16 ENCOUNTER — Encounter: Payer: Self-pay | Admitting: Family Medicine

## 2021-02-16 NOTE — Telephone Encounter (Signed)
Patient's wife called about papers being filled and sent to another doctor for disability. She also stated that she would like the MyChart messages to be sent as well. Can someone give her a call back at [phone]?

## 2021-02-16 NOTE — Telephone Encounter (Signed)
Can you handle this please and see where they need to be faxed to?

## 2021-02-16 NOTE — Telephone Encounter (Signed)
Paper work has been received, Currently in [place] "Sign" folder. Once they are filled out I will fax them and give the patients wife a call.

## 2021-02-23 ENCOUNTER — Encounter: Payer: Self-pay | Admitting: Family Medicine

## 2021-02-25 ENCOUNTER — Telehealth: Payer: Self-pay | Admitting: Cardiovascular Disease

## 2021-02-25 NOTE — Telephone Encounter (Signed)
Call Mrs. Leidy back at [phone].

## 2021-02-25 NOTE — Telephone Encounter (Signed)
[name] is calling to speak with the nurse about her husband

## 2021-02-25 NOTE — Telephone Encounter (Signed)
Patients wife called in and is very upset she states the patient will not eat and is losing lots of weight and she just doesn't know what to do. Patient would like a CMA to call her back.

## 2021-02-25 NOTE — Telephone Encounter (Signed)
Patient's wife called, she is concerned about the patient being weak and losing so much weight. Patient is down to 124 lbs. She would like to know if Patient can take CBD gummies to help gain weight and would it be okay with his heart medications. Will forward to Pharm D for advisement.

## 2021-02-25 NOTE — Telephone Encounter (Signed)
Called and spoke with pt wife and advised ER since I had not gotten a response from Dr. Jerline Pain yet. Pt wife states pt has kidney cancer and is going for a MRI Friday to make sure it has not spread to his brain. Pt wife states pt has continued to lose weight and does not want to eat. I informed pt wife that this is normal in cancer patients for them to have little to no appetite. She states she has been giving pt ensure and is not sure what else to do to increase his appetite. I made her aware Dr. Yong Channel is out of the office until next week and she states she will await to hear back from me regarding Dr. Jerline Pain recommendation and she will call pt oncologist.

## 2021-02-26 ENCOUNTER — Other Ambulatory Visit: Payer: Self-pay | Admitting: Family Medicine

## 2021-02-26 NOTE — Telephone Encounter (Signed)
Due to patient's severe comorbidities, would suggest she ask oncology for suggestions.  Not sure how helpful CBD gummies would be, but also not likely harmful either.  There is a product called Dronabinol that is prescription only that is used for patients with no appetite who are very ill.  Suggest she also ask oncologist what his thoughts are.  Other option could be Megace.

## 2021-02-27 NOTE — Telephone Encounter (Signed)
Called patient's wife and gave her the recommendations of our Pharm D. Patient's wife verbalized understanding and will contact oncology doctor.

## 2021-03-01 ENCOUNTER — Other Ambulatory Visit: Payer: Self-pay | Admitting: Family Medicine

## 2021-03-04 ENCOUNTER — Emergency Department (HOSPITAL_COMMUNITY): Payer: Medicare Other

## 2021-03-04 ENCOUNTER — Inpatient Hospital Stay (HOSPITAL_COMMUNITY)
Admission: EM | Admit: 2021-03-04 | Discharge: 2021-03-15 | DRG: 637 | Disposition: A | Discharge status: Home Health | Payer: Medicare Other | Attending: Internal Medicine | Admitting: Internal Medicine

## 2021-03-04 ENCOUNTER — Encounter (HOSPITAL_COMMUNITY): Payer: Self-pay

## 2021-03-04 ENCOUNTER — Encounter: Payer: Self-pay | Admitting: Family Medicine

## 2021-03-04 DIAGNOSIS — L89151 Pressure ulcer of sacral region, stage 1: Secondary | ICD-10-CM | POA: Diagnosis present

## 2021-03-04 DIAGNOSIS — Z8249 Family history of ischemic heart disease and other diseases of the circulatory system: Secondary | ICD-10-CM

## 2021-03-04 DIAGNOSIS — E1110 Type 2 diabetes mellitus with ketoacidosis without coma: Principal | ICD-10-CM | POA: Diagnosis present

## 2021-03-04 DIAGNOSIS — E875 Hyperkalemia: Secondary | ICD-10-CM | POA: Diagnosis present

## 2021-03-04 DIAGNOSIS — Z83438 Family history of other disorder of lipoprotein metabolism and other lipidemia: Secondary | ICD-10-CM

## 2021-03-04 DIAGNOSIS — E1165 Type 2 diabetes mellitus with hyperglycemia: Secondary | ICD-10-CM | POA: Diagnosis present

## 2021-03-04 DIAGNOSIS — K219 Gastro-esophageal reflux disease without esophagitis: Secondary | ICD-10-CM | POA: Diagnosis present

## 2021-03-04 DIAGNOSIS — I69320 Aphasia following cerebral infarction: Secondary | ICD-10-CM

## 2021-03-04 DIAGNOSIS — I5042 Chronic combined systolic (congestive) and diastolic (congestive) heart failure: Secondary | ICD-10-CM | POA: Diagnosis present

## 2021-03-04 DIAGNOSIS — I428 Other cardiomyopathies: Secondary | ICD-10-CM | POA: Diagnosis present

## 2021-03-04 DIAGNOSIS — I252 Old myocardial infarction: Secondary | ICD-10-CM

## 2021-03-04 DIAGNOSIS — I2510 Atherosclerotic heart disease of native coronary artery without angina pectoris: Secondary | ICD-10-CM | POA: Diagnosis present

## 2021-03-04 DIAGNOSIS — Z7901 Long term (current) use of anticoagulants: Secondary | ICD-10-CM

## 2021-03-04 DIAGNOSIS — R64 Cachexia: Secondary | ICD-10-CM | POA: Diagnosis present

## 2021-03-04 DIAGNOSIS — F419 Anxiety disorder, unspecified: Secondary | ICD-10-CM | POA: Diagnosis present

## 2021-03-04 DIAGNOSIS — I482 Chronic atrial fibrillation, unspecified: Secondary | ICD-10-CM | POA: Diagnosis present

## 2021-03-04 DIAGNOSIS — G4733 Obstructive sleep apnea (adult) (pediatric): Secondary | ICD-10-CM | POA: Diagnosis present

## 2021-03-04 DIAGNOSIS — Z951 Presence of aortocoronary bypass graft: Secondary | ICD-10-CM

## 2021-03-04 DIAGNOSIS — C642 Malignant neoplasm of left kidney, except renal pelvis: Secondary | ICD-10-CM | POA: Diagnosis present

## 2021-03-04 DIAGNOSIS — E1100 Type 2 diabetes mellitus with hyperosmolarity without nonketotic hyperglycemic-hyperosmolar coma (NKHHC): Secondary | ICD-10-CM | POA: Diagnosis present

## 2021-03-04 DIAGNOSIS — L8990 Pressure ulcer of unspecified site, unspecified stage: Secondary | ICD-10-CM | POA: Insufficient documentation

## 2021-03-04 DIAGNOSIS — J309 Allergic rhinitis, unspecified: Secondary | ICD-10-CM | POA: Diagnosis present

## 2021-03-04 DIAGNOSIS — I69351 Hemiplegia and hemiparesis following cerebral infarction affecting right dominant side: Secondary | ICD-10-CM

## 2021-03-04 DIAGNOSIS — Z79899 Other long term (current) drug therapy: Secondary | ICD-10-CM

## 2021-03-04 DIAGNOSIS — E871 Hypo-osmolality and hyponatremia: Secondary | ICD-10-CM | POA: Diagnosis not present

## 2021-03-04 DIAGNOSIS — R55 Syncope and collapse: Secondary | ICD-10-CM

## 2021-03-04 DIAGNOSIS — R188 Other ascites: Secondary | ICD-10-CM | POA: Diagnosis present

## 2021-03-04 DIAGNOSIS — E785 Hyperlipidemia, unspecified: Secondary | ICD-10-CM | POA: Diagnosis present

## 2021-03-04 DIAGNOSIS — R599 Enlarged lymph nodes, unspecified: Secondary | ICD-10-CM | POA: Diagnosis present

## 2021-03-04 DIAGNOSIS — E860 Dehydration: Secondary | ICD-10-CM | POA: Diagnosis present

## 2021-03-04 DIAGNOSIS — Z4659 Encounter for fitting and adjustment of other gastrointestinal appliance and device: Secondary | ICD-10-CM

## 2021-03-04 DIAGNOSIS — Z9221 Personal history of antineoplastic chemotherapy: Secondary | ICD-10-CM

## 2021-03-04 DIAGNOSIS — E43 Unspecified severe protein-calorie malnutrition: Secondary | ICD-10-CM | POA: Diagnosis present

## 2021-03-04 DIAGNOSIS — Z885 Allergy status to narcotic agent status: Secondary | ICD-10-CM

## 2021-03-04 DIAGNOSIS — Z7951 Long term (current) use of inhaled steroids: Secondary | ICD-10-CM

## 2021-03-04 DIAGNOSIS — I110 Hypertensive heart disease with heart failure: Secondary | ICD-10-CM | POA: Diagnosis present

## 2021-03-04 DIAGNOSIS — Z87891 Personal history of nicotine dependence: Secondary | ICD-10-CM

## 2021-03-04 DIAGNOSIS — Z9111 Patient's noncompliance with dietary regimen: Secondary | ICD-10-CM

## 2021-03-04 DIAGNOSIS — Z20822 Contact with and (suspected) exposure to covid-19: Secondary | ICD-10-CM | POA: Diagnosis present

## 2021-03-04 DIAGNOSIS — N179 Acute kidney failure, unspecified: Secondary | ICD-10-CM | POA: Diagnosis not present

## 2021-03-04 DIAGNOSIS — R627 Adult failure to thrive: Secondary | ICD-10-CM | POA: Diagnosis present

## 2021-03-04 DIAGNOSIS — E11649 Type 2 diabetes mellitus with hypoglycemia without coma: Secondary | ICD-10-CM | POA: Diagnosis not present

## 2021-03-04 DIAGNOSIS — Z7952 Long term (current) use of systemic steroids: Secondary | ICD-10-CM

## 2021-03-04 DIAGNOSIS — D638 Anemia in other chronic diseases classified elsewhere: Secondary | ICD-10-CM | POA: Diagnosis present

## 2021-03-04 DIAGNOSIS — Z7984 Long term (current) use of oral hypoglycemic drugs: Secondary | ICD-10-CM

## 2021-03-04 DIAGNOSIS — Z681 Body mass index (BMI) 19 or less, adult: Secondary | ICD-10-CM

## 2021-03-04 LAB — COMPREHENSIVE METABOLIC PANEL
ALT: 23 U/L (ref 0–44)
AST: 17 U/L (ref 15–41)
Albumin: 2 g/dL — ABNORMAL LOW (ref 3.5–5.0)
Alkaline Phosphatase: 202 U/L — ABNORMAL HIGH (ref 38–126)
Anion gap: 10 (ref 5–15)
BUN: 100 mg/dL — ABNORMAL HIGH (ref 8–23)
CO2: 15 mmol/L — ABNORMAL LOW (ref 22–32)
Calcium: 9.3 mg/dL (ref 8.9–10.3)
Chloride: 100 mmol/L (ref 98–111)
Creatinine, Ser: 2.14 mg/dL — ABNORMAL HIGH (ref 0.61–1.24)
GFR, Estimated: 34 mL/min — ABNORMAL LOW (ref >60)
Glucose, Bld: 624 mg/dL (ref 70–99)
Potassium: 5.1 mmol/L (ref 3.5–5.1)
Sodium: 125 mmol/L — ABNORMAL LOW (ref 135–145)
Total Bilirubin: 0.8 mg/dL (ref 0.3–1.2)
Total Protein: 7 g/dL (ref 6.5–8.1)

## 2021-03-04 LAB — I-STAT CHEM 8, ED
BUN: 107 mg/dL — ABNORMAL HIGH (ref 8–23)
Calcium, Ion: 1.37 mmol/L (ref 1.15–1.40)
Chloride: 108 mmol/L (ref 98–111)
Creatinine, Ser: 2 mg/dL — ABNORMAL HIGH (ref 0.61–1.24)
Glucose, Bld: 664 mg/dL (ref 70–99)
HCT: 32 % — ABNORMAL LOW (ref 39.0–52.0)
Hemoglobin: 10.9 g/dL — ABNORMAL LOW (ref 13.0–17.0)
Potassium: 5.5 mmol/L — ABNORMAL HIGH (ref 3.5–5.1)
Sodium: 134 mmol/L — ABNORMAL LOW (ref 135–145)
TCO2: 16 mmol/L — ABNORMAL LOW (ref 22–32)

## 2021-03-04 LAB — I-STAT VENOUS BLOOD GAS, ED
Acid-base deficit: 9 mmol/L — ABNORMAL HIGH (ref 0.0–2.0)
Bicarbonate: 16.3 mmol/L — ABNORMAL LOW (ref 20.0–28.0)
Calcium, Ion: 1.38 mmol/L (ref 1.15–1.40)
HCT: 32 % — ABNORMAL LOW (ref 39.0–52.0)
Hemoglobin: 10.9 g/dL — ABNORMAL LOW (ref 13.0–17.0)
O2 Saturation: 97 %
Potassium: 5.6 mmol/L — ABNORMAL HIGH (ref 3.5–5.1)
Sodium: 135 mmol/L (ref 135–145)
TCO2: 17 mmol/L — ABNORMAL LOW (ref 22–32)
pCO2, Ven: 33.9 mmHg — ABNORMAL LOW (ref 44.0–60.0)
pH, Ven: 7.29 (ref 7.250–7.430)
pO2, Ven: 101 mmHg — ABNORMAL HIGH (ref 32.0–45.0)

## 2021-03-04 LAB — CBC WITH DIFFERENTIAL/PLATELET
Abs Immature Granulocytes: 0.1 10*3/uL — ABNORMAL HIGH (ref 0.00–0.07)
Basophils Absolute: 0 10*3/uL (ref 0.0–0.1)
Basophils Relative: 0 %
Eosinophils Absolute: 0 10*3/uL (ref 0.0–0.5)
Eosinophils Relative: 0 %
HCT: 32.6 % — ABNORMAL LOW (ref 39.0–52.0)
Hemoglobin: 9.6 g/dL — ABNORMAL LOW (ref 13.0–17.0)
Immature Granulocytes: 1 %
Lymphocytes Relative: 4 %
Lymphs Abs: 0.6 10*3/uL — ABNORMAL LOW (ref 0.7–4.0)
MCH: 26.4 pg (ref 26.0–34.0)
MCHC: 29.4 g/dL — ABNORMAL LOW (ref 30.0–36.0)
MCV: 89.8 fL (ref 80.0–100.0)
Monocytes Absolute: 0.9 10*3/uL (ref 0.1–1.0)
Monocytes Relative: 7 %
Neutro Abs: 12.6 10*3/uL — ABNORMAL HIGH (ref 1.7–7.7)
Neutrophils Relative %: 88 %
Platelets: 409 10*3/uL — ABNORMAL HIGH (ref 150–400)
RBC: 3.63 MIL/uL — ABNORMAL LOW (ref 4.22–5.81)
RDW: 17.5 % — ABNORMAL HIGH (ref 11.5–15.5)
WBC: 14.2 10*3/uL — ABNORMAL HIGH (ref 4.0–10.5)
nRBC: 0 % (ref 0.0–0.2)

## 2021-03-04 LAB — CBG MONITORING, ED
Glucose-Capillary: 384 mg/dL — ABNORMAL HIGH (ref 70–99)
Glucose-Capillary: 490 mg/dL — ABNORMAL HIGH (ref 70–99)
Glucose-Capillary: 508 mg/dL (ref 70–99)
Glucose-Capillary: 599 mg/dL (ref 70–99)

## 2021-03-04 LAB — LACTIC ACID, PLASMA: Lactic Acid, Venous: 2.5 mmol/L (ref 0.5–1.9)

## 2021-03-04 LAB — CK: Total CK: <5 U/L — ABNORMAL LOW (ref 49–397)

## 2021-03-04 MED ORDER — LACTATED RINGERS IV BOLUS
20.0000 mL/kg | Freq: Once | INTRAVENOUS | Status: AC
  Administered 2021-03-04: 1088 mL via INTRAVENOUS

## 2021-03-04 MED ORDER — ROSUVASTATIN CALCIUM 20 MG PO TABS
20.0000 mg | ORAL_TABLET | Freq: Every day | ORAL | Status: DC
  Administered 2021-03-05 – 2021-03-15 (×11): 20 mg via ORAL
  Filled 2021-03-04 (×11): qty 1

## 2021-03-04 MED ORDER — SODIUM CHLORIDE 0.9 % IV BOLUS
1000.0000 mL | Freq: Once | INTRAVENOUS | Status: AC
  Administered 2021-03-04: 1000 mL via INTRAVENOUS

## 2021-03-04 MED ORDER — APIXABAN 5 MG PO TABS
5.0000 mg | ORAL_TABLET | Freq: Two times a day (BID) | ORAL | Status: DC
  Administered 2021-03-05 (×2): 5 mg via ORAL
  Filled 2021-03-04 (×2): qty 1

## 2021-03-04 MED ORDER — BISOPROLOL FUMARATE 5 MG PO TABS
10.0000 mg | ORAL_TABLET | Freq: Every day | ORAL | Status: DC
  Administered 2021-03-05 – 2021-03-15 (×11): 10 mg via ORAL
  Filled 2021-03-04 (×7): qty 2
  Filled 2021-03-04: qty 1
  Filled 2021-03-04 (×4): qty 2

## 2021-03-04 MED ORDER — LACTATED RINGERS IV SOLN: INTRAVENOUS | Status: DC

## 2021-03-04 MED ORDER — ALBUTEROL SULFATE (2.5 MG/3ML) 0.083% IN NEBU: 2.5000 mg | INHALATION_SOLUTION | Freq: Four times a day (QID) | RESPIRATORY_TRACT | Status: DC | PRN

## 2021-03-04 MED ORDER — DEXTROSE IN LACTATED RINGERS 5 % IV SOLN: INTRAVENOUS | Status: DC

## 2021-03-04 MED ORDER — INSULIN REGULAR(HUMAN) IN NACL 100-0.9 UT/100ML-% IV SOLN
INTRAVENOUS | Status: DC
  Administered 2021-03-04: 8 [IU]/h via INTRAVENOUS
  Filled 2021-03-04: qty 100

## 2021-03-04 MED ORDER — VALACYCLOVIR HCL 500 MG PO TABS: 1000.0000 mg | ORAL_TABLET | Freq: Every day | ORAL | Status: DC

## 2021-03-04 MED ORDER — DEXTROSE 50 % IV SOLN
0.0000 mL | INTRAVENOUS | Status: DC | PRN
  Administered 2021-03-06: 50 mL via INTRAVENOUS

## 2021-03-04 MED ORDER — ENOXAPARIN SODIUM 30 MG/0.3ML IJ SOSY: 30.0000 mg | PREFILLED_SYRINGE | Freq: Every day | INTRAMUSCULAR | Status: DC

## 2021-03-04 MED ORDER — INSULIN ASPART 100 UNIT/ML IJ SOLN
5.0000 [IU] | Freq: Once | INTRAMUSCULAR | Status: AC
  Administered 2021-03-04: 5 [IU] via SUBCUTANEOUS

## 2021-03-04 MED ORDER — PREDNISONE 5 MG PO TABS
5.0000 mg | ORAL_TABLET | Freq: Every day | ORAL | Status: DC
  Administered 2021-03-05 – 2021-03-15 (×11): 5 mg via ORAL
  Filled 2021-03-04 (×12): qty 1

## 2021-03-04 MED ORDER — SODIUM CHLORIDE 0.9 % IV BOLUS
1000.0000 mL | Freq: Once | INTRAVENOUS | Status: DC
Start: 2021-03-04 — End: 2021-03-15

## 2021-03-04 MED ORDER — TRIAMCINOLONE ACETONIDE 0.1 % EX CREA: 1.0000 "application " | TOPICAL_CREAM | Freq: Two times a day (BID) | CUTANEOUS | Status: DC | PRN

## 2021-03-04 MED ORDER — ADULT MULTIVITAMIN W/MINERALS CH
1.0000 | ORAL_TABLET | Freq: Every day | ORAL | Status: DC
  Administered 2021-03-05 – 2021-03-15 (×11): 1 via ORAL
  Filled 2021-03-04 (×11): qty 1

## 2021-03-04 NOTE — H&P (Addendum)
History and Physical  Larry Garner EXH:[id number] DOB: September 16, 1955 DOA: 03/04/2021  Referring physician: Dr. Darl Householder, [place]. PCP: Marin Olp, MD  Outpatient Specialists: Neurology, cardiology, hematology/oncology. Patient coming from: Home.  Lives with his wife.  Chief Complaint: Dizziness, poor oral intake.  HPI: Larry Garner is a 65 y.o. 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 fib on Eliquis, chronic anxiety, presented to [facility] ED from home due to dizziness, and poor oral intake.  History is mainly obtained from EDP, review of medical records and from his wife at bedside who reports that he has lost significant amount of weight due to poor appetite and poor oral intake.  Tonight she offered him a shake.  Reported episode of dizziness and sudden onset tremors, resolved in the ED.  EMS was activated.  CBG registered up as high.  Was brought into the ED for further evaluation.  While in the ED he is found to be hyperglycemic with metabolic acidosis.  Later after being admitted his beta hydroxybutyrate acid came back negative.   Initial venous blood gas pH of 7.2 and serum bicarb of 15.  Started on insulin drip and IV fluid hydration in the ED.  Admitted to hospitalist service.  ED Course:  BP 125/68, pulse 75, respiration rate 24, O2 saturation 99% on room air.  Lab studies remarkable for serum sodium of 135, serum potassium of 5.6, serum glucose 664, serum bicarb 15, BUN 107, creatinine 2.0, GFR 34.  Alkaline phosphatase 202, CPK less than 5.  Lactic acid 2.5.  WBC 14.2, hemoglobin 10.9, MCV 89, platelet count 4 9.  Review of Systems: Review of systems as noted in the HPI. All other systems reviewed and are negative.   Past Medical History:  Diagnosis Date   Allergic rhinitis    Allergy    Anxiety    Asthma    CHF (congestive heart  failure) ([place])    Diabetes ([place])    Diverticulitis    Dyspnea    GERD (gastroesophageal reflux disease)    very occ   History of heart attack    HLD (hyperlipidemia)    HTN (hypertension)    Myocardial infarction ([place]) 2000   Sleep apnea    wears cpap    Tubular adenoma of colon 09/2008   Past Surgical History:  Procedure Laterality Date   CARDIAC CATHETERIZATION N/A 07/20/2016   Procedure: Left Heart Cath and Cors/Grafts Angiography;  Surgeon: Belva Crome, MD;  Location: [place] [facility];  Service: Cardiovascular;  Laterality: N/A;   CATARACT EXTRACTION Bilateral 06/06/2019   Dr. Tommy Rainwater. oct left, dec right 2020    COLONOSCOPY     CORONARY ANGIOPLASTY     CORONARY ARTERY BYPASS GRAFT  06/1999   POLYPECTOMY     RENAL BIOPSY  06/2020   TONSILLECTOMY     late50's early 23's    Social History:  reports that he quit smoking about 43 years ago. His smoking use included cigarettes. He has a 2.00 pack-year smoking history. He has never used smokeless tobacco. He reports previous alcohol use. He reports that he does not use drugs.   Allergies  Allergen Reactions   Morphine Sulfate Nausea And Vomiting and Swelling    Family History  Problem Relation Age of Onset   Hypertension Mother    Alzheimer's disease Mother    Colon polyps Mother  Hyperlipidemia Father    Hypertension Father    Colon polyps Father    Colon cancer Paternal Aunt    Pancreatic cancer Neg Hx    Stomach cancer Neg Hx    Thyroid disease Neg Hx    Esophageal cancer Neg Hx    Rectal cancer Neg Hx       Prior to Admission medications   Medication Sig Start Date End Date Taking? Authorizing Provider  albuterol (VENTOLIN HFA) 108 (90 Base) MCG/ACT inhaler Inhale 2 puffs into the lungs every 6 (six) hours as needed for wheezing or shortness of breath. 06/17/20  Yes Marin Olp, MD  apixaban (ELIQUIS) 5 MG TABS tablet Take 1 tablet (5 mg total) by mouth in the morning and at bedtime. 08/12/20  Yes  Weaver, Scott T, PA-C  Azelastine-Fluticasone 137-50 MCG/ACT SUSP Place 1 spray into the nose daily as needed (allergies).   Yes [provider]  bisoprolol (ZEBETA) 10 MG tablet TAKE 1 TABLET BY MOUTH EVERY DAY Patient taking differently: Take 10 mg by mouth daily. 10/06/20  Yes Marin Olp, MD  cyanocobalamin 1000 MCG tablet Take 1,000 mcg by mouth daily.   Yes [provider]  empagliflozin (JARDIANCE) 10 MG TABS tablet Take 1 tablet (10 mg total) by mouth daily before breakfast. 08/22/20  Yes Marin Olp, MD  famciclovir (FAMVIR) 500 MG tablet TAKE 3 TABLETS BY MOUTH EVERY DAY AS NEEDED AT FIRST SIGN OF OUTBREAK OF FEVER BLISTER Patient taking differently: Take 500 mg by mouth daily as needed (fever blister). 04/25/19  Yes Marin Olp, MD  furosemide (LASIX) 40 MG tablet Take 1 tablet (40 mg total) by mouth daily. 10/08/20  Yes Josue Hector, MD  glimepiride (AMARYL) 2 MG tablet Take 2 mg by mouth daily as needed (only if blood sugar over 110).   Yes [provider]  metFORMIN (GLUCOPHAGE) 1000 MG tablet Take 1,000 mg by mouth 2 (two) times daily. 12/12/20  Yes [provider]  Multiple Vitamin (MULTIVITAMIN) tablet Take 1 tablet by mouth daily.   Yes [provider]  nitroGLYCERIN (NITROSTAT) 0.4 MG SL tablet PLACE 1 TABLET UNDER THE TONGUE EVERY 5 MINUTES AS NEEDED FOR CHEST PAIN. Patient taking differently: Place 0.4 mg under the tongue every 5 (five) minutes as needed. 07/20/19  Yes Marin Olp, MD  predniSONE (DELTASONE) 5 MG tablet Take 5 mg by mouth daily. 12/24/20  Yes [provider]  rosuvastatin (CRESTOR) 20 MG tablet TAKE 1 TABLET BY MOUTH DAILY Patient taking differently: Take 20 mg by mouth daily. 02/13/20  Yes Marin Olp, MD  sacubitril-valsartan (ENTRESTO) 49-51 MG Take 1 tablet by mouth 2 (two) times daily. 10/08/20  Yes Josue Hector, MD  spironolactone (ALDACTONE) 25 MG tablet Take 0.5 tablets (12.5  mg total) by mouth daily. Patient taking differently: Take 12.5-25 mg by mouth See admin instructions. Takes 25 mg on Monday and Thursday and 12.5 mg on all other days 08/05/20  Yes Richardson Dopp T, PA-C  triamcinolone cream (KENALOG) 0.1 % Apply 1 application topically 2 (two) times daily as needed (itching). 11/13/20  Yes [provider]  triamcinolone lotion (KENALOG) 0.1 % Apply 1 application topically 2 (two) times daily as needed (itching). 02/16/21  Yes [provider]  ALPRAZolam Duanne Moron) 0.5 MG tablet Take 1 tablet (0.5 mg total) by mouth every 6 (six) hours as needed. Patient not taking: No sig reported 06/23/20   Marin Olp, MD  Blood Glucose  Monitoring Suppl (CONTOUR NEXT ONE) KIT Use to test blood sugar daily. Dx: E11.9 12/01/20   Marin Olp, MD  glucose blood (BAYER CONTOUR NEXT TEST) test strip Use to test blood sugars daily. Dx: E11.9 12/01/20   Marin Olp, MD  hydrOXYzine (ATARAX/VISTARIL) 25 MG tablet Take 25 mg by mouth 3 (three) times daily as needed. Patient not taking: No sig reported    [provider]  metFORMIN (GLUCOPHAGE) 500 MG tablet Take 1 tablet (500 mg total) by mouth 2 (two) times daily with a meal. No print Patient not taking: No sig reported 12/12/20   Marin Olp, MD  oxymetazoline (AFRIN) 0.05 % nasal spray Place 1 spray into both nostrils 2 (two) times daily as needed for congestion. Patient not taking: No sig reported    [provider]  zaleplon (SONATA) 10 MG capsule Take 1 capsule (10 mg total) by mouth at bedtime as needed. for sleep Patient not taking: No sig reported 08/27/20   Marin Olp, MD    Physical Exam: BP 125/68   Pulse 78   Resp (!) 29   Ht '5\' 11"'  (1.803 m)   Wt 54.4 kg   SpO2 100%   BMI 16.74 kg/m   General: 65 y.o. year-old male well developed well nourished in no acute distress.  Somnolent but arousable to voices.  Emaciated. Cardiovascular: Regular rate and rhythm with  no rubs or gallops.  No thyromegaly or JVD noted.  No lower extremity edema. 2/4 pulses in all 4 extremities. Respiratory: Clear to auscultation with no wheezes or rales. Good inspiratory effort. Abdomen: Soft nontender nondistended with normal bowel sounds x4 quadrants. Muskuloskeletal: No cyanosis, clubbing or edema noted bilaterally Neuro: CN II-XII intact, strength, sensation, reflexes Skin: No ulcerative lesions noted or rashes Psychiatry: Judgement and insight appear altered. Mood is appropriate for condition and setting          Labs on Admission:  Basic Metabolic Panel: Recent Labs  Lab 03/04/21 2052 03/04/21 2111  NA 125* 135  134*  K 5.1 5.6*  5.5*  CL 100 108  CO2 15*  --   GLUCOSE 624* 664*  BUN 100* 107*  CREATININE 2.14* 2.00*  CALCIUM 9.3  --    Liver Function Tests: Recent Labs  Lab 03/04/21 2052  AST 17  ALT 23  ALKPHOS 202*  BILITOT 0.8  PROT 7.0  ALBUMIN 2.0*   No results for input(s): LIPASE, AMYLASE in the last 168 hours. No results for input(s): AMMONIA in the last 168 hours. CBC: Recent Labs  Lab 03/04/21 2052 03/04/21 2111  WBC 14.2*  --   NEUTROABS 12.6*  --   HGB 9.6* 10.9*  10.9*  HCT 32.6* 32.0*  32.0*  MCV 89.8  --   PLT 409*  --    Cardiac Enzymes: Recent Labs  Lab 03/04/21 2052  CKTOTAL <5*    BNP (last 3 results) Recent Labs    07/05/20 0912  BNP 1,435.5*    ProBNP (last 3 results) No results for input(s): PROBNP in the last 8760 hours.  CBG: Recent Labs  Lab 03/04/21 2050 03/04/21 2211  GLUCAP 599* 508*    Radiological Exams on Admission: DG Chest Port 1 View  Result Date: 03/04/2021 CLINICAL DATA:  Syncope EXAM: PORTABLE CHEST 1 VIEW COMPARISON:  07/06/2020 FINDINGS: Mild cardiac enlargement. Left coronary stent. Negative for heart failure. Surgical clips left hilum from prior resection. Lungs well aerated and clear. No infiltrate or effusion. No change  from the prior study. IMPRESSION: No active  disease. Electronically Signed   By: Franchot Gallo M.D.   On: 03/04/2021 21:20    EKG: I independently viewed the EKG done and my findings are as followed: Sinus rhythm rate of 81.  Nonspecific ST-T changes.  QTc 467  Assessment/Plan Present on Admission:  DKA, type 2 (HCC)  Active Problems:   DKA, type 2 ([place])  HHS in the setting of diet noncompliance and dehydration from poor oral intake. Presented with serum glucose 664 and serum bicarb of 15 with anion gap of 10. Beta hydroxybutyric acid return after admission, negative x2, likely HHS. He was started on Endo tool. Last hemoglobin A1c 6.8 on 12/12/2020 Repeat BMP, serial every 4 hours Beta hydroxybutyrate acid every 8 hours Continue insulin drip and IV fluid hydration Diabetes coordinator N.p.o. until acidosis is resolved  Non anion gap metabolic acidosis in the setting of dehydration, AKI, lactic acidosis. Presented with pH of 7.290, serum bicarb of 15 Monitor serum bicarb and continue IV fluid hydration Repeat VBG pH 7.1 with serum bicarb of 16 Amps of bicarb ordered and started on isotonic bicarb drip.  Hyperkalemia in the setting of renal insufficiency Serum potassium 5.6 Received insulin drip Started isotonic bicarb drip.  AKI, suspect prerenal in the setting of dehydration from poor oral intake Presented with BUN 100, creatinine 2.14 with GFR 34  Continue IV fluid hydration Monitor urine output Serial BMPs  Leukocytosis, suspect reactive in the setting of DKA Chest x-ray is nonacute Urinalysis is pending Nonseptic appearing Monitor for now  Sepsis, unclear source UA is pending Follow cultures Start Rocephin empirically  Anemia of chronic disease Appears to be at his baseline hemoglobin 10.9 No overt bleeding Monitor H&H     DVT prophylaxis: Eliquis   Code Status: Full code  Family Communication: His wife at bedside.  Disposition Plan: Admitted to progressive unit.  Consults called: PCCM Dr.  Lamonte Sakai due to worsening metabolic acidosis.  Admission status: Observation status.   Status is: Observation    Dispo:  Patient From: Home  Planned Disposition: Home, possibly on 03/05/2021 or when acidosis has resolved.  Medically stable for discharge: No      Kayleen Memos MD [facility] Pager [phone]  If 7PM-7AM, please contact night-coverage [email or web address] Password [facility]  03/04/2021, 10:52 PM

## 2021-03-04 NOTE — ED Provider Notes (Addendum)
[facility] EMERGENCY DEPARTMENT Provider Note   CSN: [id number] Arrival date & time: 03/04/21  2035     History Chief Complaint  Patient presents with   Loss of Consciousness    Larry Garner is a 65 y.o. male history of CHF, hypertension, type 2 diabetes here presenting with confusion and altered mental status.  Patient has poor p.o. intake at baseline.  He usually does not eat or drink much.  He went to physical therapy today and was noted to be very shaky and tremulous.  He also outside in the heat a lot.  He refused to eat and wife gave him some milkshake and he finally tolerated that.  She checked his sugar and will 500 so called EMS.  He also was lightheaded and dizzy.  Patient did not actually pass out.  He has expressive aphasia from recent stroke.  Per the wife, he is back to his baseline mental status now.  The history is provided by the patient, the EMS personnel and a relative.      Past Medical History:  Diagnosis Date   Allergic rhinitis    Allergy    Anxiety    Asthma    CHF (congestive heart failure) ([place])    Diabetes ([place])    Diverticulitis    Dyspnea    GERD (gastroesophageal reflux disease)    very occ   History of heart attack    HLD (hyperlipidemia)    HTN (hypertension)    Myocardial infarction ([place]) 2000   Sleep apnea    wears cpap    Tubular adenoma of colon 09/2008    Patient Active Problem List   Diagnosis Date Noted   Nonischemic cardiomyopathy ([place]) 10/06/2020   Cryptogenic stroke ([place]) 10/06/2020   Chronic combined systolic and diastolic heart failure ([place]) 07/17/2020   Renal cell carcinoma ([place]) 07/17/2020   Protein-calorie malnutrition, severe 07/08/2020   Acute respiratory failure with hypoxia ([place]) 07/05/2020   Iron deficiency anemia due to chronic blood loss 06/19/2020   OSA (obstructive sleep apnea) 01/09/2018   Pulmonary nodule, left 03/15/2017   Former smoker 11/04/2014   Insomnia 05/27/2014   Plantar wart  of left foot 11/27/2010   Type 2 diabetes mellitus with other specified complication ([place]) 25/12/3974   PSA, INCREASED 09/15/2009   Diverticulitis of colon 05/08/2009   Asthma, moderate persistent 06/19/2008   ANKLE PAIN, CHRONIC 06/05/2007   Allergic rhinitis 05/17/2007   ERECTILE DYSFUNCTION, SECONDARY TO MEDICATION 05/17/2007   Hyperlipidemia 04/06/2007   Anxiety state 04/06/2007   Essential hypertension 04/06/2007   Coronary artery disease involving native coronary artery of native heart without angina pectoris 04/06/2007    Past Surgical History:  Procedure Laterality Date   CARDIAC CATHETERIZATION N/A 07/20/2016   Procedure: Left Heart Cath and Cors/Grafts Angiography;  Surgeon: Belva Crome, MD;  Location: [facility];  Service: Cardiovascular;  Laterality: N/A;   CATARACT EXTRACTION Bilateral 06/06/2019   Dr. Tommy Rainwater. oct left, dec right 2020    COLONOSCOPY     CORONARY ANGIOPLASTY     CORONARY ARTERY BYPASS GRAFT  06/1999   POLYPECTOMY     RENAL BIOPSY  06/2020   TONSILLECTOMY     late50's early 22's       Family History  Problem Relation Age of Onset   Hypertension Mother    Alzheimer's disease Mother    Colon polyps Mother    Hyperlipidemia Father    Hypertension Father    Colon polyps  Father    Colon cancer Paternal Aunt    Pancreatic cancer Neg Hx    Stomach cancer Neg Hx    Thyroid disease Neg Hx    Esophageal cancer Neg Hx    Rectal cancer Neg Hx     Social History   Tobacco Use   Smoking status: Former    Packs/day: 0.50    Years: 4.00    Pack years: 2.00    Types: Cigarettes    Quit date: 09/29/1977    Years since quitting: 43.4   Smokeless tobacco: Never   Tobacco comments:    quit on 1980  Vaping Use   Vaping Use: Never used  Substance Use Topics   Alcohol use: Not Currently   Drug use: No    Home Medications Prior to Admission medications   Medication Sig Start Date End Date Taking? Authorizing Provider  albuterol  (VENTOLIN HFA) 108 (90 Base) MCG/ACT inhaler Inhale 2 puffs into the lungs every 6 (six) hours as needed for wheezing or shortness of breath. 06/17/20   Marin Olp, MD  ALPRAZolam Duanne Moron) 0.5 MG tablet Take 1 tablet (0.5 mg total) by mouth every 6 (six) hours as needed. Patient not taking: Reported on 01/28/2021 06/23/20   Marin Olp, MD  amLODipine (NORVASC) 5 MG tablet Take 5 mg by mouth daily. 11/15/20   [provider]  apixaban (ELIQUIS) 5 MG TABS tablet Take 1 tablet (5 mg total) by mouth in the morning and at bedtime. 08/12/20   Richardson Dopp T, PA-C  Azelastine-Fluticasone 137-50 MCG/ACT SUSP Place 1 spray into the nose daily as needed (allergies).    [provider]  bisoprolol (ZEBETA) 10 MG tablet TAKE 1 TABLET BY MOUTH EVERY DAY 10/06/20   Marin Olp, MD  Blood Glucose Monitoring Suppl (CONTOUR NEXT ONE) KIT Use to test blood sugar daily. Dx: E11.9 12/01/20   Marin Olp, MD  cyanocobalamin 1000 MCG tablet Take 1,000 mcg by mouth daily.    [provider]  empagliflozin (JARDIANCE) 10 MG TABS tablet Take 1 tablet (10 mg total) by mouth daily before breakfast. 08/22/20   Marin Olp, MD  famciclovir (FAMVIR) 500 MG tablet TAKE 3 TABLETS BY MOUTH EVERY DAY AS NEEDED AT FIRST SIGN OF OUTBREAK OF FEVER BLISTER 04/25/19   Marin Olp, MD  furosemide (LASIX) 40 MG tablet Take 1 tablet (40 mg total) by mouth daily. 10/08/20   Josue Hector, MD  glimepiride (AMARYL) 2 MG tablet Take 2 mg by mouth as needed. Only use if blood sugar is over 110    [provider]  glucose blood (BAYER CONTOUR NEXT TEST) test strip Use to test blood sugars daily. Dx: E11.9 12/01/20   Marin Olp, MD  hydrOXYzine (ATARAX/VISTARIL) 25 MG tablet Take 25 mg by mouth 3 (three) times daily as needed. Patient not taking: Reported on 01/28/2021    [provider]  metFORMIN (GLUCOPHAGE) 1000 MG tablet Take 1,000 mg by mouth 2 (two) times daily.  12/12/20   [provider]  metFORMIN (GLUCOPHAGE) 500 MG tablet Take 1 tablet (500 mg total) by mouth 2 (two) times daily with a meal. No print 12/12/20   Marin Olp, MD  Multiple Vitamin (MULTIVITAMIN) tablet Take 1 tablet by mouth daily.    [provider]  nitroGLYCERIN (NITROSTAT) 0.4 MG SL tablet PLACE 1 TABLET UNDER THE TONGUE EVERY 5 MINUTES AS NEEDED FOR CHEST PAIN. 07/20/19   Marin Olp,  MD  oxymetazoline (AFRIN) 0.05 % nasal spray Place 1 spray into both nostrils 2 (two) times daily as needed for congestion. Patient not taking: Reported on 01/28/2021    [provider]  predniSONE (DELTASONE) 5 MG tablet Take 1 tablet by mouth daily. 12/24/20   [provider]  rosuvastatin (CRESTOR) 20 MG tablet TAKE 1 TABLET BY MOUTH DAILY 02/13/20   Marin Olp, MD  sacubitril-valsartan (ENTRESTO) 49-51 MG Take 1 tablet by mouth 2 (two) times daily. 10/08/20   Josue Hector, MD  spironolactone (ALDACTONE) 25 MG tablet Take 0.5 tablets (12.5 mg total) by mouth daily. 08/05/20   Richardson Dopp T, PA-C  triamcinolone cream (KENALOG) 0.1 % Apply 1 application topically 2 (two) times daily. 11/13/20   [provider]  triamcinolone lotion (KENALOG) 0.1 % Apply topically 2 (two) times daily. 02/16/21   [provider]  zaleplon (SONATA) 10 MG capsule Take 1 capsule (10 mg total) by mouth at bedtime as needed. for sleep Patient not taking: Reported on 01/28/2021 08/27/20   Marin Olp, MD    Allergies    Morphine sulfate  Review of Systems   Review of Systems  Neurological:  Positive for dizziness and weakness.  All other systems reviewed and are negative.  Physical Exam Updated Vital Signs BP 125/68   Pulse 78   Resp (!) 29   Ht '5\' 11"'  (1.803 m)   Wt 54.4 kg   SpO2 100%   BMI 16.74 kg/m   Physical Exam Vitals and nursing note reviewed.  Constitutional:      Comments: Patient is cachectic and dehydrated  HENT:     Head:  Normocephalic.     Comments: No obvious scalp hematoma or trauma    Nose: Nose normal.     Mouth/Throat:     Mouth: Mucous membranes are dry.  Eyes:     Extraocular Movements: Extraocular movements intact.     Pupils: Pupils are equal, round, and reactive to light.  Cardiovascular:     Rate and Rhythm: Regular rhythm. Tachycardia present.     Pulses: Normal pulses.     Heart sounds: Normal heart sounds.  Pulmonary:     Comments: Tachypneic Abdominal:     General: Abdomen is flat.     Palpations: Abdomen is soft.  Musculoskeletal:        General: Normal range of motion.     Cervical back: Normal range of motion.  Skin:    General: Skin is warm.     Capillary Refill: Capillary refill takes less than 2 seconds.  Neurological:     Comments: Patient has expressive aphasia which is baseline.  Patient's contractures is also baseline  Psychiatric:     Comments: Unable     ED Results / Procedures / Treatments   Labs (all labs ordered are listed, but only abnormal results are displayed) Labs Reviewed  CBC WITH DIFFERENTIAL/PLATELET - Abnormal; Notable for the following components:      Result Value   WBC 14.2 (*)    RBC 3.63 (*)    Hemoglobin 9.6 (*)    HCT 32.6 (*)    MCHC 29.4 (*)    RDW 17.5 (*)    Platelets 409 (*)    Neutro Abs 12.6 (*)    Lymphs Abs 0.6 (*)    Abs Immature Granulocytes 0.10 (*)    All other components within normal limits  COMPREHENSIVE METABOLIC PANEL - Abnormal; Notable for the following components:  Sodium 125 (*)    CO2 15 (*)    Glucose, Bld 624 (*)    BUN 100 (*)    Creatinine, Ser 2.14 (*)    Albumin 2.0 (*)    Alkaline Phosphatase 202 (*)    GFR, Estimated 34 (*)    All other components within normal limits  LACTIC ACID, PLASMA - Abnormal; Notable for the following components:   Lactic Acid, Venous 2.5 (*)    All other components within normal limits  CBG MONITORING, ED - Abnormal; Notable for the following components:    Glucose-Capillary 599 (*)    All other components within normal limits  I-STAT CHEM 8, ED - Abnormal; Notable for the following components:   Sodium 134 (*)    Potassium 5.5 (*)    BUN 107 (*)    Creatinine, Ser 2.00 (*)    Glucose, Bld 664 (*)    TCO2 16 (*)    Hemoglobin 10.9 (*)    HCT 32.0 (*)    All other components within normal limits  I-STAT VENOUS BLOOD GAS, ED - Abnormal; Notable for the following components:   pCO2, Ven 33.9 (*)    pO2, Ven 101.0 (*)    Bicarbonate 16.3 (*)    TCO2 17 (*)    Acid-base deficit 9.0 (*)    Potassium 5.6 (*)    HCT 32.0 (*)    Hemoglobin 10.9 (*)    All other components within normal limits  CULTURE, BLOOD (ROUTINE X 2)  CULTURE, BLOOD (ROUTINE X 2)  RESP PANEL BY RT-PCR (FLU A&B, COVID) ARPGX2  BLOOD GAS, VENOUS  CK  LACTIC ACID, PLASMA  RAPID URINE DRUG SCREEN, HOSP PERFORMED  CBG MONITORING, ED    EKG EKG Interpretation  Date/Time:  Wednesday March 04 2021 20:48:56 EDT Ventricular Rate:  81 PR Interval:  225 QRS Duration: 145 QT Interval:  402 QTC Calculation: 467 R Axis:   -65 Text Interpretation: Sinus rhythm Prolonged PR interval RBBB and LAFB Abnormal T, consider ischemia, lateral leads unchanged since previous Confirmed by Wandra Arthurs [phone]) on 03/04/2021 9:07:22 PM  Radiology DG Chest Port 1 View  Result Date: 03/04/2021 CLINICAL DATA:  Syncope EXAM: PORTABLE CHEST 1 VIEW COMPARISON:  07/06/2020 FINDINGS: Mild cardiac enlargement. Left coronary stent. Negative for heart failure. Surgical clips left hilum from prior resection. Lungs well aerated and clear. No infiltrate or effusion. No change from the prior study. IMPRESSION: No active disease. Electronically Signed   By: Franchot Gallo M.D.   On: 03/04/2021 21:20    Procedures Procedures   CRITICAL CARE Performed by: Wandra Arthurs   Total critical care time: 30 minutes  Critical care time was exclusive of separately billable procedures and treating other  patients.  Critical care was necessary to treat or prevent imminent or life-threatening deterioration.  Critical care was time spent personally by me on the following activities: development of treatment plan with patient and/or surrogate as well as nursing, discussions with consultants, evaluation of patient's response to treatment, examination of patient, obtaining history from patient or surrogate, ordering and performing treatments and interventions, ordering and review of laboratory studies, ordering and review of radiographic studies, pulse oximetry and re-evaluation of patient's condition.   Medications Ordered in ED Medications  insulin regular, human (MYXREDLIN) 100 units/ 100 mL infusion (has no administration in time range)  lactated ringers infusion (has no administration in time range)  dextrose 5 % in lactated ringers infusion (has no administration in time  range)  dextrose 50 % solution 0-50 mL (has no administration in time range)  insulin aspart (novoLOG) injection 5 Units (5 Units Subcutaneous Given 03/04/21 2102)  sodium chloride 0.9 % bolus 1,000 mL (1,000 mLs Intravenous New Bag/Given 03/04/21 2100)    ED Course  I have reviewed the triage vital signs and the nursing notes.  Pertinent labs & imaging results that were available during my care of the patient were reviewed by me and considered in my medical decision making (see chart for details).    MDM Rules/Calculators/A&P                          Larry Garner is a 65 y.o. male here with dehydration, hyperglycemia, possible heat exhaustion. Appears dehydrated. Has expressive aphasia and difficult to get history. Will get labs, VBG, CK level. CT head ordered   10:18 PM Mental status back to baseline. Aphasia is baseline per wife who is at bedside. Bicarb is 15 but AG is 10. Has AKI with Cr 2.  Cancel CT head given the he has no head injury and mental status is baseline.  Patient given 2 L normal saline bolus.  Started  on insulin drip since his ABG showed pH of 7.2.  CK level is low.  Hospitalist to admit for hyperglycemia and early DKA.    Final Clinical Impression(s) / ED Diagnoses Final diagnoses:  None    Rx / DC Orders ED Discharge Orders     None        Drenda Freeze, MD 03/04/21 2223    Drenda Freeze, MD 03/04/21 2224

## 2021-03-04 NOTE — ED Triage Notes (Signed)
Pt BIB [facility] for eval of syncopal episode. Pt stood up out of car, got dizzy and syncopized. On EMS arrival pt was awake, but lethargic. CBG read "HI". Hx of CVA, R sided residual deficits and speech. Hx of Renal CA. Sitting on porch in heat throughout the day, more activity than normal

## 2021-03-04 NOTE — ED Notes (Signed)
Pt off floor in scan at this time, will adjust insulin on return

## 2021-03-05 ENCOUNTER — Other Ambulatory Visit: Payer: Self-pay

## 2021-03-05 ENCOUNTER — Telehealth: Payer: Self-pay

## 2021-03-05 DIAGNOSIS — L89151 Pressure ulcer of sacral region, stage 1: Secondary | ICD-10-CM | POA: Diagnosis present

## 2021-03-05 DIAGNOSIS — I482 Chronic atrial fibrillation, unspecified: Secondary | ICD-10-CM | POA: Diagnosis present

## 2021-03-05 DIAGNOSIS — N179 Acute kidney failure, unspecified: Secondary | ICD-10-CM

## 2021-03-05 DIAGNOSIS — E43 Unspecified severe protein-calorie malnutrition: Secondary | ICD-10-CM | POA: Diagnosis present

## 2021-03-05 DIAGNOSIS — C642 Malignant neoplasm of left kidney, except renal pelvis: Secondary | ICD-10-CM | POA: Diagnosis present

## 2021-03-05 DIAGNOSIS — R627 Adult failure to thrive: Secondary | ICD-10-CM | POA: Diagnosis present

## 2021-03-05 DIAGNOSIS — E1100 Type 2 diabetes mellitus with hyperosmolarity without nonketotic hyperglycemic-hyperosmolar coma (NKHHC): Secondary | ICD-10-CM | POA: Diagnosis not present

## 2021-03-05 DIAGNOSIS — E11649 Type 2 diabetes mellitus with hypoglycemia without coma: Secondary | ICD-10-CM | POA: Diagnosis not present

## 2021-03-05 DIAGNOSIS — E860 Dehydration: Secondary | ICD-10-CM | POA: Diagnosis present

## 2021-03-05 DIAGNOSIS — D638 Anemia in other chronic diseases classified elsewhere: Secondary | ICD-10-CM | POA: Diagnosis present

## 2021-03-05 DIAGNOSIS — E1165 Type 2 diabetes mellitus with hyperglycemia: Secondary | ICD-10-CM | POA: Diagnosis not present

## 2021-03-05 DIAGNOSIS — I5042 Chronic combined systolic (congestive) and diastolic (congestive) heart failure: Secondary | ICD-10-CM | POA: Diagnosis present

## 2021-03-05 DIAGNOSIS — I69351 Hemiplegia and hemiparesis following cerebral infarction affecting right dominant side: Secondary | ICD-10-CM | POA: Diagnosis not present

## 2021-03-05 DIAGNOSIS — I69320 Aphasia following cerebral infarction: Secondary | ICD-10-CM | POA: Diagnosis not present

## 2021-03-05 DIAGNOSIS — E1110 Type 2 diabetes mellitus with ketoacidosis without coma: Secondary | ICD-10-CM | POA: Diagnosis present

## 2021-03-05 DIAGNOSIS — R188 Other ascites: Secondary | ICD-10-CM | POA: Diagnosis present

## 2021-03-05 DIAGNOSIS — F419 Anxiety disorder, unspecified: Secondary | ICD-10-CM | POA: Diagnosis present

## 2021-03-05 DIAGNOSIS — Z681 Body mass index (BMI) 19 or less, adult: Secondary | ICD-10-CM | POA: Diagnosis not present

## 2021-03-05 DIAGNOSIS — I428 Other cardiomyopathies: Secondary | ICD-10-CM | POA: Diagnosis present

## 2021-03-05 DIAGNOSIS — I110 Hypertensive heart disease with heart failure: Secondary | ICD-10-CM | POA: Diagnosis present

## 2021-03-05 DIAGNOSIS — E871 Hypo-osmolality and hyponatremia: Secondary | ICD-10-CM | POA: Diagnosis not present

## 2021-03-05 DIAGNOSIS — J309 Allergic rhinitis, unspecified: Secondary | ICD-10-CM | POA: Diagnosis present

## 2021-03-05 DIAGNOSIS — Z20822 Contact with and (suspected) exposure to covid-19: Secondary | ICD-10-CM | POA: Diagnosis present

## 2021-03-05 DIAGNOSIS — E160 Drug-induced hypoglycemia without coma: Secondary | ICD-10-CM | POA: Diagnosis not present

## 2021-03-05 DIAGNOSIS — R64 Cachexia: Secondary | ICD-10-CM | POA: Diagnosis present

## 2021-03-05 DIAGNOSIS — E785 Hyperlipidemia, unspecified: Secondary | ICD-10-CM | POA: Diagnosis present

## 2021-03-05 DIAGNOSIS — R599 Enlarged lymph nodes, unspecified: Secondary | ICD-10-CM | POA: Diagnosis present

## 2021-03-05 LAB — BASIC METABOLIC PANEL
Anion gap: 8 (ref 5–15)
Anion gap: 10 (ref 5–15)
Anion gap: 7 (ref 5–15)
Anion gap: 6 (ref 5–15)
Anion gap: 8 (ref 5–15)
Anion gap: 10 (ref 5–15)
BUN: 87 mg/dL — ABNORMAL HIGH (ref 8–23)
BUN: 84 mg/dL — ABNORMAL HIGH (ref 8–23)
BUN: 84 mg/dL — ABNORMAL HIGH (ref 8–23)
BUN: 87 mg/dL — ABNORMAL HIGH (ref 8–23)
BUN: 79 mg/dL — ABNORMAL HIGH (ref 8–23)
BUN: 80 mg/dL — ABNORMAL HIGH (ref 8–23)
CO2: 15 mmol/L — ABNORMAL LOW (ref 22–32)
CO2: 16 mmol/L — ABNORMAL LOW (ref 22–32)
CO2: 15 mmol/L — ABNORMAL LOW (ref 22–32)
CO2: 19 mmol/L — ABNORMAL LOW (ref 22–32)
CO2: 22 mmol/L (ref 22–32)
CO2: 20 mmol/L — ABNORMAL LOW (ref 22–32)
Calcium: 9.6 mg/dL (ref 8.9–10.3)
Calcium: 9.9 mg/dL (ref 8.9–10.3)
Calcium: 9.6 mg/dL (ref 8.9–10.3)
Calcium: 9.4 mg/dL (ref 8.9–10.3)
Calcium: 9.2 mg/dL (ref 8.9–10.3)
Calcium: 9.2 mg/dL (ref 8.9–10.3)
Chloride: 111 mmol/L (ref 98–111)
Chloride: 110 mmol/L (ref 98–111)
Chloride: 113 mmol/L — ABNORMAL HIGH (ref 98–111)
Chloride: 112 mmol/L — ABNORMAL HIGH (ref 98–111)
Chloride: 110 mmol/L (ref 98–111)
Chloride: 108 mmol/L (ref 98–111)
Creatinine, Ser: 1.84 mg/dL — ABNORMAL HIGH (ref 0.61–1.24)
Creatinine, Ser: 1.79 mg/dL — ABNORMAL HIGH (ref 0.61–1.24)
Creatinine, Ser: 1.79 mg/dL — ABNORMAL HIGH (ref 0.61–1.24)
Creatinine, Ser: 1.78 mg/dL — ABNORMAL HIGH (ref 0.61–1.24)
Creatinine, Ser: 1.78 mg/dL — ABNORMAL HIGH (ref 0.61–1.24)
Creatinine, Ser: 1.82 mg/dL — ABNORMAL HIGH (ref 0.61–1.24)
GFR, Estimated: 40 mL/min — ABNORMAL LOW (ref >60)
GFR, Estimated: 42 mL/min — ABNORMAL LOW (ref >60)
GFR, Estimated: 42 mL/min — ABNORMAL LOW (ref >60)
GFR, Estimated: 42 mL/min — ABNORMAL LOW (ref >60)
GFR, Estimated: 42 mL/min — ABNORMAL LOW (ref >60)
GFR, Estimated: 41 mL/min — ABNORMAL LOW (ref >60)
Glucose, Bld: 238 mg/dL — ABNORMAL HIGH (ref 70–99)
Glucose, Bld: 109 mg/dL — ABNORMAL HIGH (ref 70–99)
Glucose, Bld: 95 mg/dL (ref 70–99)
Glucose, Bld: 90 mg/dL (ref 70–99)
Glucose, Bld: 113 mg/dL — ABNORMAL HIGH (ref 70–99)
Glucose, Bld: 123 mg/dL — ABNORMAL HIGH (ref 70–99)
Potassium: 4.7 mmol/L (ref 3.5–5.1)
Potassium: 4.7 mmol/L (ref 3.5–5.1)
Potassium: 4.7 mmol/L (ref 3.5–5.1)
Potassium: 4.5 mmol/L (ref 3.5–5.1)
Potassium: 4.4 mmol/L (ref 3.5–5.1)
Potassium: 4.8 mmol/L (ref 3.5–5.1)
Sodium: 134 mmol/L — ABNORMAL LOW (ref 135–145)
Sodium: 136 mmol/L (ref 135–145)
Sodium: 135 mmol/L (ref 135–145)
Sodium: 137 mmol/L (ref 135–145)
Sodium: 140 mmol/L (ref 135–145)
Sodium: 138 mmol/L (ref 135–145)

## 2021-03-05 LAB — I-STAT VENOUS BLOOD GAS, ED
Acid-base deficit: 12 mmol/L — ABNORMAL HIGH (ref 0.0–2.0)
Bicarbonate: 16.1 mmol/L — ABNORMAL LOW (ref 20.0–28.0)
Calcium, Ion: 1.38 mmol/L (ref 1.15–1.40)
HCT: 32 % — ABNORMAL LOW (ref 39.0–52.0)
Hemoglobin: 10.9 g/dL — ABNORMAL LOW (ref 13.0–17.0)
O2 Saturation: 95 %
Potassium: 5.7 mmol/L — ABNORMAL HIGH (ref 3.5–5.1)
Sodium: 135 mmol/L (ref 135–145)
TCO2: 17 mmol/L — ABNORMAL LOW (ref 22–32)
pCO2, Ven: 46.2 mmHg (ref 44.0–60.0)
pH, Ven: 7.151 — CL (ref 7.250–7.430)
pO2, Ven: 96 mmHg — ABNORMAL HIGH (ref 32.0–45.0)

## 2021-03-05 LAB — CBG MONITORING, ED
Glucose-Capillary: 252 mg/dL — ABNORMAL HIGH (ref 70–99)
Glucose-Capillary: 139 mg/dL — ABNORMAL HIGH (ref 70–99)
Glucose-Capillary: 116 mg/dL — ABNORMAL HIGH (ref 70–99)
Glucose-Capillary: 101 mg/dL — ABNORMAL HIGH (ref 70–99)
Glucose-Capillary: 104 mg/dL — ABNORMAL HIGH (ref 70–99)

## 2021-03-05 LAB — PROCALCITONIN: Procalcitonin: 0.49 ng/mL

## 2021-03-05 LAB — PHOSPHORUS: Phosphorus: 1.8 mg/dL — ABNORMAL LOW (ref 2.5–4.6)

## 2021-03-05 LAB — GLUCOSE, CAPILLARY
Glucose-Capillary: 102 mg/dL — ABNORMAL HIGH (ref 70–99)
Glucose-Capillary: 135 mg/dL — ABNORMAL HIGH (ref 70–99)
Glucose-Capillary: 72 mg/dL (ref 70–99)

## 2021-03-05 LAB — CBC
HCT: 26.9 % — ABNORMAL LOW (ref 39.0–52.0)
Hemoglobin: 8.6 g/dL — ABNORMAL LOW (ref 13.0–17.0)
MCH: 27.2 pg (ref 26.0–34.0)
MCHC: 32 g/dL (ref 30.0–36.0)
MCV: 85.1 fL (ref 80.0–100.0)
Platelets: 342 10*3/uL (ref 150–400)
RBC: 3.16 MIL/uL — ABNORMAL LOW (ref 4.22–5.81)
RDW: 17.2 % — ABNORMAL HIGH (ref 11.5–15.5)
WBC: 12.4 10*3/uL — ABNORMAL HIGH (ref 4.0–10.5)
nRBC: 0 % (ref 0.0–0.2)

## 2021-03-05 LAB — MAGNESIUM: Magnesium: 1.8 mg/dL (ref 1.7–2.4)

## 2021-03-05 LAB — RESP PANEL BY RT-PCR (FLU A&B, COVID) ARPGX2
Influenza A by PCR: NEGATIVE
Influenza B by PCR: NEGATIVE
SARS Coronavirus 2 by RT PCR: NEGATIVE

## 2021-03-05 LAB — BETA-HYDROXYBUTYRIC ACID
Beta-Hydroxybutyric Acid: 0.11 mmol/L (ref 0.05–0.27)
Beta-Hydroxybutyric Acid: 0.1 mmol/L (ref 0.05–0.27)
Beta-Hydroxybutyric Acid: 0.1 mmol/L (ref 0.05–0.27)

## 2021-03-05 LAB — LACTIC ACID, PLASMA
Lactic Acid, Venous: 2.3 mmol/L (ref 0.5–1.9)
Lactic Acid, Venous: 1.3 mmol/L (ref 0.5–1.9)
Lactic Acid, Venous: 1.3 mmol/L (ref 0.5–1.9)

## 2021-03-05 MED ORDER — SODIUM CHLORIDE 0.9 % IV SOLN: INTRAVENOUS | Status: DC

## 2021-03-05 MED ORDER — STERILE WATER FOR INJECTION IV SOLN
INTRAVENOUS | Status: DC
  Filled 2021-03-05: qty 1000

## 2021-03-05 MED ORDER — INSULIN ASPART 100 UNIT/ML IJ SOLN
0.0000 [IU] | Freq: Every day | INTRAMUSCULAR | Status: DC
  Administered 2021-03-06 – 2021-03-09 (×3): 2 [IU] via SUBCUTANEOUS
  Administered 2021-03-10: 5 [IU] via SUBCUTANEOUS
  Administered 2021-03-11: 3 [IU] via SUBCUTANEOUS
  Administered 2021-03-12: 4 [IU] via SUBCUTANEOUS
  Administered 2021-03-13 – 2021-03-14 (×2): 3 [IU] via SUBCUTANEOUS

## 2021-03-05 MED ORDER — SODIUM CHLORIDE 0.9 % IV SOLN
1.0000 g | INTRAVENOUS | Status: DC
  Administered 2021-03-05: 1 g via INTRAVENOUS
  Filled 2021-03-05: qty 10

## 2021-03-05 MED ORDER — INSULIN GLARGINE-YFGN 100 UNIT/ML ~~LOC~~ SOLN
5.0000 [IU] | Freq: Every day | SUBCUTANEOUS | Status: DC
  Administered 2021-03-05: 5 [IU] via SUBCUTANEOUS
  Filled 2021-03-05 (×2): qty 0.05

## 2021-03-05 MED ORDER — SODIUM BICARBONATE 8.4 % IV SOLN
100.0000 meq | INTRAVENOUS | Status: AC
  Administered 2021-03-05: 100 meq via INTRAVENOUS
  Filled 2021-03-05: qty 100

## 2021-03-05 MED ORDER — DEXTROSE IN LACTATED RINGERS 5 % IV SOLN: INTRAVENOUS | Status: DC

## 2021-03-05 MED ORDER — INSULIN ASPART 100 UNIT/ML IJ SOLN
0.0000 [IU] | Freq: Three times a day (TID) | INTRAMUSCULAR | Status: DC
  Administered 2021-03-05: 1 [IU] via SUBCUTANEOUS
  Administered 2021-03-06: 3 [IU] via SUBCUTANEOUS
  Administered 2021-03-06: 2 [IU] via SUBCUTANEOUS
  Administered 2021-03-07 (×2): 3 [IU] via SUBCUTANEOUS
  Administered 2021-03-08: 5 [IU] via SUBCUTANEOUS
  Administered 2021-03-08: 2 [IU] via SUBCUTANEOUS
  Administered 2021-03-08: 7 [IU] via SUBCUTANEOUS
  Administered 2021-03-09 (×2): 3 [IU] via SUBCUTANEOUS
  Administered 2021-03-10: 2 [IU] via SUBCUTANEOUS
  Administered 2021-03-10: 5 [IU] via SUBCUTANEOUS
  Administered 2021-03-10: 2 [IU] via SUBCUTANEOUS
  Administered 2021-03-11: 7 [IU] via SUBCUTANEOUS
  Administered 2021-03-11 – 2021-03-12 (×2): 3 [IU] via SUBCUTANEOUS
  Administered 2021-03-13: 9 [IU] via SUBCUTANEOUS
  Administered 2021-03-13: 1 [IU] via SUBCUTANEOUS
  Administered 2021-03-13: 5 [IU] via SUBCUTANEOUS
  Administered 2021-03-14: 1 [IU] via SUBCUTANEOUS
  Administered 2021-03-14 (×2): 3 [IU] via SUBCUTANEOUS

## 2021-03-05 MED ORDER — MIRTAZAPINE 15 MG PO TABS
7.5000 mg | ORAL_TABLET | Freq: Every day | ORAL | Status: DC
  Administered 2021-03-05 – 2021-03-07 (×3): 7.5 mg via ORAL
  Filled 2021-03-05 (×3): qty 1

## 2021-03-05 NOTE — Evaluation (Signed)
Physical Therapy Evaluation Patient Details Name: Larry Garner MRN: [id number] DOB: October 20, 1955 Today's Date: 03/05/2021   History of Present Illness  Pt is a 65 y/o male admitted 7/27 secondary to HHS in the setting of diet noncompliance and dehydration from poor oral intake and DKA. PMH includes CVA with R sided and cognitive deficits, CHF, DM, HTN, and renal cancer.  Clinical Impression  Pt admitted secondary to problem above with deficits below. Pt requiring min guard to min A for mobility tasks using RW. Occasional posterior lean noted and required multimodal cues for correction. Pt's wife present and reports plan is to take pt home at d/c. Feel he would benefit from hospital bed for increased safety in the home as pt has a flight of steps to get to his bedroom. Recommending HHPT at d/c to increase independence and safety. Will continue to follow acutely.     Follow Up Recommendations Home health PT;Supervision/Assistance - 24 hour    Equipment Recommendations  Hospital bed    Recommendations for Other Services       Precautions / Restrictions Precautions Precautions: Fall Restrictions Weight Bearing Restrictions: No      Mobility  Bed Mobility Overal bed mobility: Needs Assistance Bed Mobility: Supine to Sit;Sit to Supine     Supine to sit: Min guard Sit to supine: Min guard   General bed mobility comments: Min guard for safety.    Transfers Overall transfer level: Needs assistance Equipment used: Rolling walker (2 wheeled) Transfers: Sit to/from Stand Sit to Stand: Min assist         General transfer comment: Min A for lift assist and steadying. Posterior lean initially, but able to correct with multimodal cues.  Ambulation/Gait Ambulation/Gait assistance: Min assist;Min guard Gait Distance (Feet): 20 Feet Assistive device: Rolling walker (2 wheeled) Gait Pattern/deviations: Step-through pattern;Decreased stride length Gait velocity: Decreased   General  Gait Details: Occasional posterior lean during gait requiring min A for steadying. Otherwise requiring min guard A for safety. Multimodal cues for sequencing using RW. Pt reporting increased fatigue, so mobility limited to within the room.  Stairs            Wheelchair Mobility    Modified Rankin (Stroke Patients Only)       Balance Overall balance assessment: Needs assistance Sitting-balance support: No upper extremity supported;Feet supported Sitting balance-Leahy Scale: Fair     Standing balance support: Bilateral upper extremity supported Standing balance-Leahy Scale: Poor Standing balance comment: Reliant on BUE support                             Pertinent Vitals/Pain Pain Assessment: No/denies pain    Home Living Family/patient expects to be discharged to:: Private residence Living Arrangements: Spouse/significant other Available Help at Discharge: Family;Available 24 hours/day Type of Home: House Home Access: Stairs to enter Entrance Stairs-Rails:  (will have rails installed over the weeked) Entrance Stairs-Number of Steps: 4 Home Layout: Two level Home Equipment: Tub bench;Walker - 2 wheels;Walker - 4 wheels      Prior Function Level of Independence: Needs assistance   Gait / Transfers Assistance Needed: Uses RW for mobility. Uses cane for steps and wife assists with stair management.  ADL's / Homemaking Assistance Needed: Requires assist with ADL tasks.        Hand Dominance        Extremity/Trunk Assessment   Upper Extremity Assessment Upper Extremity Assessment: Defer to OT evaluation  Lower Extremity Assessment Lower Extremity Assessment: Generalized weakness    Cervical / Trunk Assessment Cervical / Trunk Assessment: Kyphotic  Communication   Communication: No difficulties  Cognition Arousal/Alertness: Awake/alert Behavior During Therapy: Flat affect Overall Cognitive Status: History of cognitive impairments - at  baseline                                 General Comments: Pt's wife reports cognitive deficits since CVA.      General Comments General comments (skin integrity, edema, etc.): Pt's wife present during session    Exercises     Assessment/Plan    PT Assessment Patient needs continued PT services  PT Problem List Decreased strength;Decreased activity tolerance;Decreased balance;Decreased mobility;Decreased knowledge of use of DME;Decreased cognition;Decreased safety awareness;Decreased knowledge of precautions       PT Treatment Interventions DME instruction;Gait training;Functional mobility training;Stair training;Therapeutic exercise;Therapeutic activities;Balance training;Patient/family education;Cognitive remediation    PT Goals (Current goals can be found in the Care Plan section)  Acute Rehab PT Goals Patient Stated Goal: to go home PT Goal Formulation: With patient/family Time For Goal Achievement: 03/19/21 Potential to Achieve Goals: Good    Frequency Min 3X/week   Barriers to discharge        Co-evaluation               AM-PAC PT "6 Clicks" Mobility  Outcome Measure Help needed turning from your back to your side while in a flat bed without using bedrails?: A Little Help needed moving from lying on your back to sitting on the side of a flat bed without using bedrails?: A Little Help needed moving to and from a bed to a chair (including a wheelchair)?: A Little Help needed standing up from a chair using your arms (e.g., wheelchair or bedside chair)?: A Little Help needed to walk in hospital room?: A Little Help needed climbing 3-5 steps with a railing? : A Lot 6 Click Score: 17    End of Session Equipment Utilized During Treatment: Gait belt Activity Tolerance: Patient limited by fatigue Patient left: in bed;with call bell/phone within reach;with bed alarm set;with family/visitor present Nurse Communication: Mobility status PT Visit  Diagnosis: Unsteadiness on feet (R26.81);Muscle weakness (generalized) (M62.81)    Time: [id number] PT Time Calculation (min) (ACUTE ONLY): 23 min   Charges:   PT Evaluation $PT Eval Moderate Complexity: 1 Mod PT Treatments $Gait Training: 8-22 mins        Lou Miner, DPT  Acute Rehabilitation Services  Pager: [phone] Office: [phone]   Larry Garner 03/05/2021, 12:52 PM

## 2021-03-05 NOTE — ED Notes (Signed)
Report given to Antony Madura, RN of [phone]

## 2021-03-05 NOTE — Plan of Care (Signed)

## 2021-03-05 NOTE — TOC Progression Note (Signed)
Transition of Care [facility]) - Progression Note    Patient Details  Name: Larry Garner MRN: [id number] Date of Birth: June 24, 1956  Transition of [facility]) CM/SW Contact  Zenon Mayo, RN Phone Number: 03/05/2021, 5:45 PM  Clinical Narrative:    NCM spoke with patient and wife at bedside, offered choice, they do not have a preference. NCM made referral to [facility] with Alvis Lemmings for HHPT, he is able to take referral.  Wife states they have a walker, cane and shower chair and a belt at home and somone is coming to put grab rails in the home also.  He can ride in car for transport.  She states they would like a hospital bed and the bed will need to be there before he can discharge and she will be the contact for delivery.        Expected Discharge Plan and Services                                                 Social Determinants of Health (SDOH) Interventions    Readmission Risk Interventions No flowsheet data found.

## 2021-03-05 NOTE — Plan of Care (Signed)
  Problem: Education: Goal: Knowledge of General Education information will improve Description Including pain rating scale, medication(s)/side effects and non-pharmacologic comfort measures Outcome: Progressing   

## 2021-03-05 NOTE — Progress Notes (Signed)
    Durable Medical Equipment  (From admission, onward)           Start     Ordered   03/05/21 1744  For home use only DME Hospital bed  Once       Question Answer Comment  Length of Need Lifetime   Patient has (list medical condition): chf   The above medical condition requires: Patient requires the ability to reposition frequently   Head must be elevated greater than: 30 degrees   Bed type Semi-electric   Support Surface: Gel Overlay      03/05/21 1744

## 2021-03-05 NOTE — Telephone Encounter (Signed)
Spouse states patient is in the hospital and would like to discuss some things.  Spouse would not disclose the concerns to me.

## 2021-03-05 NOTE — Progress Notes (Signed)
Patient ID: Larry Garner, male   DOB: 01/30/1956, 65 y.o.   MRN: [id number]  PROGRESS NOTE    Larry Garner  [id number] DOB: 23-Mar-1956 DOA: 03/04/2021 PCP: Marin Olp, MD   Brief Narrative:  65 y.o. 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 fib on Eliquis, chronic anxiety presented with dizziness and poor oral intake along with significant weight loss.  On presentation, patient was hyperglycemic with serum glucose of 664, bicarb of 15, creatinine of 2, potassium of 5.6, WBCs of 14.2.  He was started on IV fluids and insulin drip.  Assessment & Plan:   Hyperglycemic hyperosmolar state Dehydration Extremely poor oral intake Failure to thrive/weight loss -Patient has had progressively worsening weight loss with poor oral intake/appetite and hence was given milkshake by wife yesterday.  Subsequently patient was found to be hyperglycemic with serum glucose of 664 and bicarb of 15 with AKI. -He was initially started on insulin drip and IV fluids.  Subsequently he has been transitioned to long-acting insulin. -Follow diabetes coordinator recommendations -Oral intake is still poor.  Patient used to be on desvenlafaxine till recently to improve his appetite but this has recently been discontinued because of increasing sleepiness. -Consult to dietitian. -We will start low-dose mirtazapine 7.5 mg q. nightly. -IV fluids as below -Blood sugar have improved.  Continue CBGs with SSI as well. -If oral intake does not improve, patient/family might have to consider alternative means of nutrition including PEG tube feeding.  Also need to consider outpatient palliative care evaluation and follow-up.  Acute kidney injury None anion gap metabolic acidosis/lactic acidosis -Acidosis has resolved.   Creatinine improving but still at 1.7 today.  Switch IV fluids to normal saline at 100 cc an hour.  Off bicarb drip. -Monitor creatinine  Hyperkalemia -Resolved  Leukocytosis -Possibly reactive.  No signs of infection.  Chest x-ray negative for infiltrates.  COVID and influenza testing were negative.  Empirically started on Rocephin on admission.  Will DC antibiotics and monitor  Left renal cell carcinoma -Patient was supposed to have surgery in December 2021 but subsequently had to be postponed because of acute stroke.  Outpatient follow-up with urology/oncology at [facility]. -He is currently not a good candidate for undergoing surgery because of weight loss/poor oral intake  Doubt that patient has sepsis -Plan as above; cultures negative so far  Anemia of chronic disease -Hemoglobin stable.  Monitor.  No signs of bleeding  Hyperlipidemia -Continue statin  Chronic A. Fib -Rate controlled.  Continue Eliquis and bisoprolol  History of CAD status post CABG -Currently stable.  Outpatient follow-up with cardiology  Generalized deconditioning -PT eval   DVT prophylaxis: Eliquis Code Status: Full Family Communication: Wife at bedside Disposition Plan: Status is: Inpatient  Remains inpatient appropriate because:Inpatient level of care appropriate due to severity of illness  Dispo:  Patient From: Home  Planned Disposition: Home in 1 to 2 days once clinically improved  Medically stable for discharge: No   Consultants: None  Procedures: None  Antimicrobials: Rocephin from 03/04/2021 onwards   Subjective: Patient seen and examined at bedside.  Poor historian.  Wife at bedside provides most of the history.  No overnight fever, vomiting, seizures reported.  Objective: Vitals:   03/05/21 0900 03/05/21 0930 03/05/21 1010 03/05/21 1032  BP: 116/61 118/67 123/63 119/70  Pulse: 80 81 81 79  Resp: (!) '25 17 18 20  '$ Temp:   98.9 F (37.2 C) 98 F (36.7 C)  TempSrc:    Oral   SpO2: 98% 98% 100% 99%  Weight:    58 kg  Height:    '5\' 11"'$  (1.803 m)    Intake/Output Summary (Last 24 hours) at 03/05/2021 1256 Last data filed at 03/05/2021 0323 Gross per 24 hour  Intake 1025.38 ml  Output --  Net 1025.38 ml   Filed Weights   03/04/21 2041 03/05/21 1032  Weight: 54.4 kg 58 kg    Examination:  General exam: Appears calm and comfortable.  Looks older than stated age and chronically ill.  Currently on room air.  Extremely thinly built Respiratory system: Bilateral decreased breath sounds at bases with some scattered crackles Cardiovascular system: S1 & S2 heard, Rate controlled Gastrointestinal system: Abdomen is nondistended, soft and nontender. Normal bowel sounds heard. Extremities: No cyanosis, clubbing, edema  Central nervous system: Wakes up slightly, does not participate in conversation much.  Very slow to respond.  No focal neurological deficits.  Right-sided hemiparesis present Skin: No rashes, lesions or ulcers Psychiatry: Could not be assessed because of mental status    Data Reviewed: I have personally reviewed following labs and imaging studies  CBC: Recent Labs  Lab 03/04/21 2052 03/04/21 2111 03/05/21 0504 03/05/21 1041  WBC 14.2*  --   --  12.4*  NEUTROABS 12.6*  --   --   --   HGB 9.6* 10.9*  10.9* 10.9* 8.6*  HCT 32.6* 32.0*  32.0* 32.0* 26.9*  MCV 89.8  --   --  85.1  PLT 409*  --   --  [id number]   Basic Metabolic Panel: Recent Labs  Lab 03/05/21 0131 03/05/21 0408 03/05/21 0504 03/05/21 0551 03/05/21 0642 03/05/21 1041  NA 134* 136 135 135 137 140  K 4.7 4.7 5.7* 4.7 4.5 4.4  CL 111 110  --  113* 112* 110  CO2 15* 16*  --  15* 19* 22  GLUCOSE 238* 109*  --  95 90 113*  BUN 87* 84*  --  84* 87* 79*  CREATININE 1.84* 1.79*  --  1.79* 1.78* 1.78*  CALCIUM 9.6 9.9  --  9.6 9.4 9.2  MG  --   --   --   --  1.8  --   PHOS  --   --   --   --  1.8*  --    GFR: Estimated Creatinine Clearance: 33.9 mL/min (A) (by C-G formula  based on SCr of 1.78 mg/dL (H)). Liver Function Tests: Recent Labs  Lab 03/04/21 2052  AST 17  ALT 23  ALKPHOS 202*  BILITOT 0.8  PROT 7.0  ALBUMIN 2.0*   No results for input(s): LIPASE, AMYLASE in the last 168 hours. No results for input(s): AMMONIA in the last 168 hours. Coagulation Profile: No results for input(s): INR, PROTIME in the last 168 hours. Cardiac Enzymes: Recent Labs  Lab 03/04/21 2052  CKTOTAL <5*   BNP (last 3 results) No results for input(s): PROBNP in the last 8760 hours. HbA1C: No results for input(s): HGBA1C in the last 72 hours. CBG: Recent Labs  Lab 03/05/21 0228 03/05/21 0359 03/05/21 0553 03/05/21 0813 03/05/21 1129  GLUCAP 139* 116* 101* 104* 102*   Lipid Profile: No results for input(s): CHOL, HDL, LDLCALC, TRIG, CHOLHDL, LDLDIRECT in the last 72 hours. Thyroid Function Tests: No results for input(s): TSH, T4TOTAL, FREET4, T3FREE, THYROIDAB in the last 72  hours. Anemia Panel: No results for input(s): VITAMINB12, FOLATE, FERRITIN, TIBC, IRON, RETICCTPCT in the last 72 hours. Sepsis Labs: Recent Labs  Lab 03/04/21 2122 03/05/21 0131 03/05/21 0642 03/05/21 1041  PROCALCITON  --   --  0.49  --   LATICACIDVEN 2.5* 2.3* 1.3 1.3    Recent Results (from the past 240 hour(s))  Blood culture (routine x 2)     Status: None (Preliminary result)   Collection Time: 03/04/21  9:00 PM   Specimen: BLOOD  Result Value Ref Range Status   Specimen Description BLOOD LEFT ANTECUBITAL  Final   Special Requests   Final    BOTTLES DRAWN AEROBIC AND ANAEROBIC Blood Culture adequate volume   Culture   Final    NO GROWTH < 24 HOURS Performed at [facility], [place] [address], [place], [place] [address]    Report Status PENDING  Incomplete  Blood culture (routine x 2)     Status: None (Preliminary result)   Collection Time: 03/04/21  9:01 PM   Specimen: BLOOD RIGHT FOREARM  Result Value Ref Range Status   Specimen Description BLOOD RIGHT  FOREARM  Final   Special Requests   Final    BOTTLES DRAWN AEROBIC AND ANAEROBIC Blood Culture adequate volume   Culture   Final    NO GROWTH < 24 HOURS Performed at [facility], [place] [address] [place], [place] [address]    Report Status PENDING  Incomplete  Resp Panel by RT-PCR (Flu A&B, Covid) Nasopharyngeal Swab     Status: None   Collection Time: 03/04/21 11:08 PM   Specimen: Nasopharyngeal Swab; Nasopharyngeal(NP) swabs in vial transport medium  Result Value Ref Range Status   SARS Coronavirus 2 by RT PCR NEGATIVE NEGATIVE Final    Comment: (NOTE) SARS-CoV-2 target nucleic acids are NOT DETECTED.  The SARS-CoV-2 RNA is generally detectable in upper respiratory specimens during the acute phase of infection. The lowest concentration of SARS-CoV-2 viral copies this assay can detect is 138 copies/mL. A negative result does not preclude SARS-Cov-2 infection and should not be used as the sole basis for treatment or other patient management decisions. A negative result may occur with  improper specimen collection/handling, submission of specimen other than nasopharyngeal swab, presence of viral mutation(s) within the areas targeted by this assay, and inadequate number of viral copies(<138 copies/mL). A negative result must be combined with clinical observations, patient history, and epidemiological information. The expected result is Negative.  Fact Sheet for Patients:  EntrepreneurPulse.com.au  Fact Sheet for Healthcare Providers:  IncredibleEmployment.be  This test is no t yet approved or cleared by the [place] FDA and  has been authorized for detection and/or diagnosis of SARS-CoV-2 by FDA under an Emergency Use Authorization (EUA). This EUA will remain  in effect (meaning this test can be used) for the duration of the COVID-19 declaration under Section 564(b)(1) of the Act, 21 U.S.C.section 360bbb-3(b)(1), unless the  authorization is terminated  or revoked sooner.       Influenza A by PCR NEGATIVE NEGATIVE Final   Influenza B by PCR NEGATIVE NEGATIVE Final    Comment: (NOTE) The Xpert Xpress SARS-CoV-2/FLU/RSV plus assay is intended as an aid in the diagnosis of influenza from Nasopharyngeal swab specimens and should not be used as a sole basis for treatment. Nasal washings and aspirates are unacceptable for Xpert Xpress SARS-CoV-2/FLU/RSV testing.  Fact Sheet for Patients: EntrepreneurPulse.com.au  Fact Sheet for Healthcare Providers: IncredibleEmployment.be  This test is not  yet approved or cleared by the [place] and has been authorized for detection and/or diagnosis of SARS-CoV-2 by FDA under an Emergency Use Authorization (EUA). This EUA will remain in effect (meaning this test can be used) for the duration of the COVID-19 declaration under Section 564(b)(1) of the Act, 21 U.S.C. section 360bbb-3(b)(1), unless the authorization is terminated or revoked.  Performed at [facility], [place] [address] [place],  [address]          Radiology Studies: US Renal  Result Date: 03/04/2021 CLINICAL DATA:  Renal failure EXAM: RENAL / URINARY TRACT ULTRASOUND COMPLETE COMPARISON:  CT 02/21/2020 FINDINGS: Right Kidney: Renal measurements: 13.8 x 5.5 x 5.6 cm = volume: 221 mL. Echogenicity remains within normal limits. Some mild renal sinus lipomatosis, often senescent. Two small simple appearing exophytic cyst in the mid to upper pole right kidney, largest measuring 2.4 cm. The second measuring 1.6 cm. No concerning internal complexity. No concerning renal mass. No visible shadowing calculus or hydronephrosis. Left Kidney: Renal measurements: 13 x 4.5 x 4.9 cm = volume: 149 mL. Echogenicity within normal limits. Large masslike focus measuring 10.9 x 7.1 x 8.4 cm seen along the anterior margin of left kidney with some indentation of the renal  parenchyma. Of note, a large subcapsular hematoma with seen in this vicinity previously. This could reflect some chronic hematoma or an underlying mass lesion, incompletely characterized on this exam. No hydronephrosis or shadowing calculus. Bladder: Appears normal for degree of bladder distention. Other: None. IMPRESSION: Large heterogeneous masslike focus abutting the left renal parenchyma. This is in the vicinity of a large hematoma seen on comparison imaging from 2021. Could reflect some recurrent or a degree of chronic hemorrhage though an underlying mass lesion is not fully excluded. Consider renal protocol CT or MR imaging for further characterization. Simple appearing cysts in the kidneys. Electronically Signed   By: Lovena Le M.D.   On: 03/04/2021 23:15   DG Chest Port 1 View  Result Date: 03/04/2021 CLINICAL DATA:  Syncope EXAM: PORTABLE CHEST 1 VIEW COMPARISON:  07/06/2020 FINDINGS: Mild cardiac enlargement. Left coronary stent. Negative for heart failure. Surgical clips left hilum from prior resection. Lungs well aerated and clear. No infiltrate or effusion. No change from the prior study. IMPRESSION: No active disease. Electronically Signed   By: Franchot Gallo M.D.   On: 03/04/2021 21:20        Scheduled Meds:  apixaban  5 mg Oral BID   bisoprolol  10 mg Oral Daily   insulin aspart  0-5 Units Subcutaneous QHS   insulin aspart  0-9 Units Subcutaneous TID WC   insulin glargine-yfgn  5 Units Subcutaneous Daily   multivitamin with minerals  1 tablet Oral Daily   predniSONE  5 mg Oral Q breakfast   rosuvastatin  20 mg Oral Daily   Continuous Infusions:  sodium chloride     cefTRIAXone (ROCEPHIN)  IV Stopped (03/05/21 1037)    sodium bicarbonate (isotonic) infusion in sterile water 125 mL/hr at 03/05/21 0653   sodium chloride            Aline August, MD [facility] 03/05/2021, 12:56 PM

## 2021-03-05 NOTE — ED Notes (Signed)
Endotool insulin change to 0.8

## 2021-03-06 ENCOUNTER — Inpatient Hospital Stay (HOSPITAL_COMMUNITY): Payer: Medicare Other

## 2021-03-06 DIAGNOSIS — N179 Acute kidney failure, unspecified: Secondary | ICD-10-CM | POA: Diagnosis not present

## 2021-03-06 DIAGNOSIS — T383X5A Adverse effect of insulin and oral hypoglycemic [antidiabetic] drugs, initial encounter: Secondary | ICD-10-CM

## 2021-03-06 DIAGNOSIS — E1100 Type 2 diabetes mellitus with hyperosmolarity without nonketotic hyperglycemic-hyperosmolar coma (NKHHC): Secondary | ICD-10-CM | POA: Diagnosis not present

## 2021-03-06 DIAGNOSIS — D72829 Elevated white blood cell count, unspecified: Secondary | ICD-10-CM

## 2021-03-06 DIAGNOSIS — R627 Adult failure to thrive: Secondary | ICD-10-CM | POA: Diagnosis not present

## 2021-03-06 DIAGNOSIS — E160 Drug-induced hypoglycemia without coma: Secondary | ICD-10-CM | POA: Diagnosis not present

## 2021-03-06 LAB — BASIC METABOLIC PANEL
Anion gap: 10 (ref 5–15)
BUN: 75 mg/dL — ABNORMAL HIGH (ref 8–23)
CO2: 20 mmol/L — ABNORMAL LOW (ref 22–32)
Calcium: 9.3 mg/dL (ref 8.9–10.3)
Chloride: 107 mmol/L (ref 98–111)
Creatinine, Ser: 1.77 mg/dL — ABNORMAL HIGH (ref 0.61–1.24)
GFR, Estimated: 42 mL/min — ABNORMAL LOW (ref >60)
Glucose, Bld: 66 mg/dL — ABNORMAL LOW (ref 70–99)
Potassium: 4.7 mmol/L (ref 3.5–5.1)
Sodium: 137 mmol/L (ref 135–145)

## 2021-03-06 LAB — GLUCOSE, CAPILLARY
Glucose-Capillary: 108 mg/dL — ABNORMAL HIGH (ref 70–99)
Glucose-Capillary: 114 mg/dL — ABNORMAL HIGH (ref 70–99)
Glucose-Capillary: 155 mg/dL — ABNORMAL HIGH (ref 70–99)
Glucose-Capillary: 171 mg/dL — ABNORMAL HIGH (ref 70–99)
Glucose-Capillary: 233 mg/dL — ABNORMAL HIGH (ref 70–99)
Glucose-Capillary: 243 mg/dL — ABNORMAL HIGH (ref 70–99)
Glucose-Capillary: 66 mg/dL — ABNORMAL LOW (ref 70–99)

## 2021-03-06 LAB — CBC
HCT: 31.2 % — ABNORMAL LOW (ref 39.0–52.0)
Hemoglobin: 9.4 g/dL — ABNORMAL LOW (ref 13.0–17.0)
MCH: 26 pg (ref 26.0–34.0)
MCHC: 30.1 g/dL (ref 30.0–36.0)
MCV: 86.4 fL (ref 80.0–100.0)
Platelets: 444 10*3/uL — ABNORMAL HIGH (ref 150–400)
RBC: 3.61 MIL/uL — ABNORMAL LOW (ref 4.22–5.81)
RDW: 17.3 % — ABNORMAL HIGH (ref 11.5–15.5)
WBC: 15.8 10*3/uL — ABNORMAL HIGH (ref 4.0–10.5)
nRBC: 0 % (ref 0.0–0.2)

## 2021-03-06 LAB — HEPATIC FUNCTION PANEL
ALT: 19 U/L (ref 0–44)
AST: 16 U/L (ref 15–41)
Albumin: 1.7 g/dL — ABNORMAL LOW (ref 3.5–5.0)
Alkaline Phosphatase: 174 U/L — ABNORMAL HIGH (ref 38–126)
Bilirubin, Direct: 0.3 mg/dL — ABNORMAL HIGH (ref 0.0–0.2)
Indirect Bilirubin: 0.7 mg/dL (ref 0.3–0.9)
Total Bilirubin: 1 mg/dL (ref 0.3–1.2)
Total Protein: 6.6 g/dL (ref 6.5–8.1)

## 2021-03-06 LAB — PHOSPHORUS: Phosphorus: 3 mg/dL (ref 2.5–4.6)

## 2021-03-06 MED ORDER — FREE WATER
150.0000 mL | Status: DC
  Administered 2021-03-06 – 2021-03-09 (×10): 150 mL

## 2021-03-06 MED ORDER — PROSOURCE TF PO LIQD
45.0000 mL | Freq: Two times a day (BID) | ORAL | Status: DC
  Administered 2021-03-07 – 2021-03-09 (×4): 45 mL
  Filled 2021-03-06 (×6): qty 45

## 2021-03-06 MED ORDER — OSMOLITE 1.2 CAL PO LIQD
1000.0000 mL | ORAL | Status: DC
  Administered 2021-03-06: 1000 mL
  Filled 2021-03-06: qty 1000

## 2021-03-06 MED ORDER — ENSURE ENLIVE PO LIQD
237.0000 mL | Freq: Two times a day (BID) | ORAL | Status: DC
  Administered 2021-03-06 – 2021-03-09 (×6): 237 mL via ORAL

## 2021-03-06 MED ORDER — OSMOLITE 1.5 CAL PO LIQD
1000.0000 mL | ORAL | Status: DC
  Administered 2021-03-06 – 2021-03-07 (×2): 1000 mL
  Filled 2021-03-06 (×5): qty 1000

## 2021-03-06 MED ORDER — DEXTROSE 50 % IV SOLN
INTRAVENOUS | Status: AC
  Filled 2021-03-06: qty 50

## 2021-03-06 MED ORDER — FOLIC ACID 1 MG PO TABS
1.0000 mg | ORAL_TABLET | Freq: Every day | ORAL | Status: DC
  Administered 2021-03-06 – 2021-03-15 (×10): 1 mg via ORAL
  Filled 2021-03-06 (×10): qty 1

## 2021-03-06 MED ORDER — APIXABAN 2.5 MG PO TABS
2.5000 mg | ORAL_TABLET | Freq: Two times a day (BID) | ORAL | Status: DC
  Administered 2021-03-06 – 2021-03-09 (×7): 2.5 mg via ORAL
  Filled 2021-03-06 (×7): qty 1

## 2021-03-06 NOTE — Progress Notes (Signed)
Physical Therapy Treatment Patient Details Name: Larry Garner MRN: [id number] DOB: September 02, 1955 Today's Date: 03/06/2021    History of Present Illness Pt is a 65 y/o male admitted 7/27 secondary to HHS in the setting of diet noncompliance and dehydration from poor oral intake and DKA. PMH includes CVA with R sided and cognitive deficits, CHF, DM, HTN, and renal cancer.    PT Comments    Pt reports fatigue and is self-limiting during session due to reported desire to return to bed. Pt participates in short periods of ambulation and requires physical assistance to direct pt when turning due to impaired sequencing. Pt also requires cues to maintain possession of walker to complete transfers and ambulation rather than abandoning device when within a few feet of bed. PT continues to recommend discharge home with HHPT and 24/7 assist from family.   Follow Up Recommendations  Home health PT;Supervision/Assistance - 24 hour     Equipment Recommendations  Hospital bed    Recommendations for Other Services       Precautions / Restrictions Precautions Precautions: Fall Restrictions Weight Bearing Restrictions: No    Mobility  Bed Mobility Overal bed mobility: Needs Assistance Bed Mobility: Sit to Supine       Sit to supine: Supervision        Transfers Overall transfer level: Needs assistance Equipment used: Rolling walker (2 wheeled) Transfers: Sit to/from Stand Sit to Stand: Min guard            Ambulation/Gait Ambulation/Gait assistance: Min assist Gait Distance (Feet): 30 Feet (30' x 2) Assistive device: Rolling walker (2 wheeled) Gait Pattern/deviations: Step-to pattern Gait velocity: reduced Gait velocity interpretation: <1.8 ft/sec, indicate of risk for recurrent falls General Gait Details: pt with slowed step-to gait, mild posterior lean for brief periods but not requiring physical assist to correct. Pt requires minA to facilitate turns in desired direction as  verbal and tactile cues prove insufficient   Stairs             Wheelchair Mobility    Modified Rankin (Stroke Patients Only)       Balance Overall balance assessment: Needs assistance Sitting-balance support: No upper extremity supported;Feet supported Sitting balance-Leahy Scale: Fair     Standing balance support: Single extremity supported;Bilateral upper extremity supported Standing balance-Leahy Scale: Poor Standing balance comment: reliant on UE support of walker                            Cognition Arousal/Alertness: Awake/alert Behavior During Therapy: Agitated (mild agitation) Overall Cognitive Status: History of cognitive impairments - at baseline                                 General Comments: pt with fluctuating cognition since CVA, pt with impaired command following needing repeated verbal and tactile cues to turn in correct direction to avoid tangling lines/leads. Pt with impaired initiation      Exercises      General Comments General comments (skin integrity, edema, etc.): VSS on RA, spouse present      Pertinent Vitals/Pain Pain Assessment: No/denies pain    Home Living                      Prior Function            PT Goals (current goals can now be found in the care  plan section) Acute Rehab PT Goals Patient Stated Goal: to go home Progress towards PT goals: Progressing toward goals    Frequency    Min 3X/week      PT Plan Current plan remains appropriate    Co-evaluation              AM-PAC PT "6 Clicks" Mobility   Outcome Measure  Help needed turning from your back to your side while in a flat bed without using bedrails?: A Little Help needed moving from lying on your back to sitting on the side of a flat bed without using bedrails?: A Little Help needed moving to and from a bed to a chair (including a wheelchair)?: A Little Help needed standing up from a chair using your arms  (e.g., wheelchair or bedside chair)?: A Little Help needed to walk in hospital room?: A Little Help needed climbing 3-5 steps with a railing? : A Lot 6 Click Score: 17    End of Session   Activity Tolerance: Patient limited by fatigue Patient left: in bed;with call bell/phone within reach;with bed alarm set;with family/visitor present Nurse Communication: Mobility status PT Visit Diagnosis: Unsteadiness on feet (R26.81);Muscle weakness (generalized) (M62.81)     Time: [id number] PT Time Calculation (min) (ACUTE ONLY): 18 min  Charges:  $Gait Training: 8-22 mins                     Zenaida Niece, PT, DPT Acute Rehabilitation Pager: [phone]    Zenaida Niece 03/06/2021, 12:11 PM

## 2021-03-06 NOTE — Procedures (Signed)
Cortrak  Person Inserting Tube:  Esaw Dace, RD Tube Type:  Cortrak - 43 inches Tube Size:  10 Tube Location:  Right nare Initial Placement:  Stomach Secured by: Bridle Technique Used to Measure Tube Placement:  Marking at nare/corner of mouth Cortrak Secured At:  75 cm   Cortrak Tube Team Note:  Consult received to place a Cortrak feeding tube.   X-ray is required, abdominal x-ray has been ordered by the Cortrak team. Please confirm tube placement before using the Cortrak tube.   If the tube becomes dislodged please keep the tube and contact the Cortrak team at [email or web address] ([email or web address]) for replacement.  If after hours and replacement cannot be delayed, place a NG tube and confirm placement with an abdominal x-ray.   Kerman Passey MS, RDN, LDN, CNSC Registered Dietitian III Clinical Nutrition RD Pager and On-Call Pager Number Located in [place]

## 2021-03-06 NOTE — Progress Notes (Signed)
Patient ID: Larry Garner, male   DOB: [birth year], 65 y.o.   MRN: [id number]  PROGRESS NOTE    Larry Garner  [id number] DOB: [birth year] DOA: 03/04/2021 PCP: Marin Olp, MD   Brief Narrative:  65 y.o. 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 fib on Eliquis, chronic anxiety presented with dizziness and poor oral intake along with significant weight loss.  On presentation, patient was hyperglycemic with serum glucose of 664, bicarb of 15, creatinine of 2, potassium of 5.6, WBCs of 14.2.  He was started on IV fluids and insulin drip.  Assessment & Plan:   Hyperglycemic hyperosmolar state Dehydration Extremely poor oral intake Failure to thrive/weight loss Hypoglycemia -Patient has had progressively worsening weight loss with poor oral intake/appetite and hence was given milkshake by wife yesterday.  Subsequently patient was found to be hyperglycemic with serum glucose of 664 and bicarb of 15 with AKI. -He was initially started on insulin drip and IV fluids.  Subsequently he has been transitioned to long-acting insulin. -Follow diabetes coordinator recommendations -Oral intake is still poor.  Patient used to be on desvenlafaxine till recently to improve his appetite but this has recently been discontinued because of increasing sleepiness. -Consult to dietitian. -Continue mirtazapine 7.5 mg q. nightly. -IV fluids as below -Blood sugar have improved.  Continue CBGs with SSI as well. -If oral intake does not improve, patient/family might have to consider alternative means of nutrition including PEG tube feeding.  Also need to consider outpatient palliative care evaluation and follow-up.  Acute kidney injury None anion gap metabolic acidosis/lactic acidosis -Acidosis has resolved.   Creatinine improving but still at 1.77 today.  Switch IV fluids to normal saline at 75 cc an hour.  Off bicarb drip. -Monitor creatinine  Hyperkalemia -Resolved  Leukocytosis -Possibly reactive.  No signs of infection.  Chest x-ray negative for infiltrates.  COVID and influenza testing were negative.  Empirically started on Rocephin on admission which was subsequently discontinued on 03/05/2021. -White count slightly worsening.  Monitor   Left renal cell carcinoma -Patient was supposed to have surgery in December 2021 but subsequently had to be postponed because of acute stroke.  Outpatient follow-up with urology/oncology at [facility]. -He is currently not a good candidate for undergoing surgery because of weight loss/poor oral intake  Doubt that patient has sepsis -Plan as above; cultures negative so far  Anemia of chronic disease -Hemoglobin stable.  Monitor.  No signs of bleeding  Hyperlipidemia -Continue statin  Chronic A. Fib -Rate controlled.  Continue Eliquis and bisoprolol  History of CAD status post CABG -Currently stable.  Outpatient follow-up with cardiology  Generalized deconditioning -PT recommends home health PT.  DVT prophylaxis: Eliquis Code Status: Full Family Communication: Wife on phone on 03/06/2021 Disposition Plan: Status is: Inpatient  Remains inpatient appropriate because:Inpatient level of care appropriate due to severity of illness  Dispo:  Patient From: Home  Planned Disposition: Home in 1 to 2 days once clinically improved  Medically stable for discharge: No   Consultants: None  Procedures: None  Antimicrobials: Rocephin from 03/04/2021 onwards   Subjective: Patient seen and examined at bedside.  Poor historian.  Oral intake is still poor.  No seizures, vomiting, worsening shortness of breath or fever reported. Objective: Vitals:   03/05/21 1941 03/06/21 0003 03/06/21 0347 03/06/21 0717  BP: 115/73 Marland Kitchen)  122/59 118/61 111/65  Pulse: 75 79  85 75  Resp: '18 18 18 20  '$ Temp: 98.5 F (36.9 C) 98.6 F (37 C) 99.3 F (37.4 C) 98.1 F (36.7 C)  TempSrc: Oral Oral Oral Oral  SpO2: 98% 100% 98% 99%  Weight:  60 kg    Height:        Intake/Output Summary (Last 24 hours) at 03/06/2021 1115 Last data filed at 03/06/2021 0811 Gross per 24 hour  Intake 2640.98 ml  Output 975 ml  Net 1665.98 ml    Filed Weights   03/04/21 2041 03/05/21 1032 03/06/21 0003  Weight: 54.4 kg 58 kg 60 kg    Examination:  General exam: No distress.  Looks older than stated age and chronically ill.  Still on room air.  Extremely thinly built Respiratory system: Decreased breath sounds at bases bilaterally with some crackles Cardiovascular system: Rate controlled, S1-S2 heard gastrointestinal system: Abdomen is distended slightly, soft and nontender.  Bowel sounds are heard Extremities: No edema or clubbing Central nervous system: Awake, answers a few basic questions.  Still very slow to respond.  No focal neurological deficits.  Right-sided hemiparesis present Skin: No obvious ecchymosis/rashes Psychiatry: Mostly flat affect.    Data Reviewed: I have personally reviewed following labs and imaging studies  CBC: Recent Labs  Lab 03/04/21 2052 03/04/21 2111 03/05/21 0504 03/05/21 1041 03/06/21 0258  WBC 14.2*  --   --  12.4* 15.8*  NEUTROABS 12.6*  --   --   --   --   HGB 9.6* 10.9*  10.9* 10.9* 8.6* 9.4*  HCT 32.6* 32.0*  32.0* 32.0* 26.9* 31.2*  MCV 89.8  --   --  85.1 86.4  PLT 409*  --   --  342 444*    Basic Metabolic Panel: Recent Labs  Lab 03/05/21 0551 03/05/21 0642 03/05/21 1041 03/05/21 1439 03/06/21 0258  NA 135 137 140 138 137  K 4.7 4.5 4.4 4.8 4.7  CL 113* 112* 110 108 107  CO2 15* 19* 22 20* 20*  GLUCOSE 95 90 113* 123* 66*  BUN 84* 87* 79* 80* 75*  CREATININE 1.79* 1.78* 1.78* 1.82* 1.77*  CALCIUM 9.6 9.4 9.2 9.2 9.3  MG  --  1.8  --   --   --   PHOS  --  1.8*  --   --   --     GFR: Estimated  Creatinine Clearance: 35.3 mL/min (A) (by C-G formula based on SCr of 1.77 mg/dL (H)). Liver Function Tests: Recent Labs  Lab 03/04/21 2052 03/06/21 0258  AST 17 16  ALT 23 19  ALKPHOS 202* 174*  BILITOT 0.8 1.0  PROT 7.0 6.6  ALBUMIN 2.0* 1.7*    No results for input(s): LIPASE, AMYLASE in the last 168 hours. No results for input(s): AMMONIA in the last 168 hours. Coagulation Profile: No results for input(s): INR, PROTIME in the last 168 hours. Cardiac Enzymes: Recent Labs  Lab 03/04/21 2052  CKTOTAL <5*    BNP (last 3 results) No results for input(s): PROBNP in the last 8760 hours. HbA1C: No results for input(s): HGBA1C in the last 72 hours. CBG: Recent Labs  Lab 03/05/21 2115 03/05/21 2359 03/06/21 0616 03/06/21 0641 03/06/21 0838  GLUCAP 72 108* 66* 155* 171*    Lipid Profile: No results for input(s): CHOL, HDL, LDLCALC, TRIG, CHOLHDL, LDLDIRECT in the last 72 hours. Thyroid Function Tests: No results for input(s): TSH, T4TOTAL, FREET4, T3FREE, THYROIDAB in the last 72  hours. Anemia Panel: No results for input(s): VITAMINB12, FOLATE, FERRITIN, TIBC, IRON, RETICCTPCT in the last 72 hours. Sepsis Labs: Recent Labs  Lab 03/04/21 2122 03/05/21 0131 03/05/21 0642 03/05/21 1041  PROCALCITON  --   --  0.49  --   LATICACIDVEN 2.5* 2.3* 1.3 1.3     Recent Results (from the past 240 hour(s))  Blood culture (routine x 2)     Status: None (Preliminary result)   Collection Time: 03/04/21  9:00 PM   Specimen: BLOOD  Result Value Ref Range Status   Specimen Description BLOOD LEFT ANTECUBITAL  Final   Special Requests   Final    BOTTLES DRAWN AEROBIC AND ANAEROBIC Blood Culture adequate volume   Culture   Final    NO GROWTH < 24 HOURS Performed at [facility], [place] [address], [place], [place] [address]    Report Status PENDING  Incomplete  Blood culture (routine x 2)     Status: None (Preliminary result)   Collection Time: 03/04/21  9:01 PM    Specimen: BLOOD RIGHT FOREARM  Result Value Ref Range Status   Specimen Description BLOOD RIGHT FOREARM  Final   Special Requests   Final    BOTTLES DRAWN AEROBIC AND ANAEROBIC Blood Culture adequate volume   Culture   Final    NO GROWTH < 24 HOURS Performed at [facility], [place] [address], [place], [place] [address]    Report Status PENDING  Incomplete  Resp Panel by RT-PCR (Flu A&B, Covid) Nasopharyngeal Swab     Status: None   Collection Time: 03/04/21 11:08 PM   Specimen: Nasopharyngeal Swab; Nasopharyngeal(NP) swabs in vial transport medium  Result Value Ref Range Status   SARS Coronavirus 2 by RT PCR NEGATIVE NEGATIVE Final    Comment: (NOTE) SARS-CoV-2 target nucleic acids are NOT DETECTED.  The SARS-CoV-2 RNA is generally detectable in upper respiratory specimens during the acute phase of infection. The lowest concentration of SARS-CoV-2 viral copies this assay can detect is 138 copies/mL. A negative result does not preclude SARS-Cov-2 infection and should not be used as the sole basis for treatment or other patient management decisions. A negative result may occur with  improper specimen collection/handling, submission of specimen other than nasopharyngeal swab, presence of viral mutation(s) within the areas targeted by this assay, and inadequate number of viral copies(<138 copies/mL). A negative result must be combined with clinical observations, patient history, and epidemiological information. The expected result is Negative.  Fact Sheet for Patients:  EntrepreneurPulse.com.au  Fact Sheet for Healthcare Providers:  IncredibleEmployment.be  This test is no t yet approved or cleared by the [place] FDA and  has been authorized for detection and/or diagnosis of SARS-CoV-2 by FDA under an Emergency Use Authorization (EUA). This EUA will remain  in effect (meaning this test can be used) for the duration of the COVID-19  declaration under Section 564(b)(1) of the Act, 21 U.S.C.section 360bbb-3(b)(1), unless the authorization is terminated  or revoked sooner.       Influenza A by PCR NEGATIVE NEGATIVE Final   Influenza B by PCR NEGATIVE NEGATIVE Final    Comment: (NOTE) The Xpert Xpress SARS-CoV-2/FLU/RSV plus assay is intended as an aid in the diagnosis of influenza from Nasopharyngeal swab specimens and should not be used as a sole basis for treatment. Nasal washings and aspirates are unacceptable for Xpert Xpress SARS-CoV-2/FLU/RSV testing.  Fact Sheet for Patients: EntrepreneurPulse.com.au  Fact Sheet for Healthcare Providers: IncredibleEmployment.be  This test is  not yet approved or cleared by the [place] and has been authorized for detection and/or diagnosis of SARS-CoV-2 by FDA under an Emergency Use Authorization (EUA). This EUA will remain in effect (meaning this test can be used) for the duration of the COVID-19 declaration under Section 564(b)(1) of the Act, 21 U.S.C. section 360bbb-3(b)(1), unless the authorization is terminated or revoked.  Performed at [facility], [place] [address] [place], [place] [address]           Radiology Studies: US Renal  Result Date: 03/04/2021 CLINICAL DATA:  Renal failure EXAM: RENAL / URINARY TRACT ULTRASOUND COMPLETE COMPARISON:  CT 02/21/2020 FINDINGS: Right Kidney: Renal measurements: 13.8 x 5.5 x 5.6 cm = volume: 221 mL. Echogenicity remains within normal limits. Some mild renal sinus lipomatosis, often senescent. Two small simple appearing exophytic cyst in the mid to upper pole right kidney, largest measuring 2.4 cm. The second measuring 1.6 cm. No concerning internal complexity. No concerning renal mass. No visible shadowing calculus or hydronephrosis. Left Kidney: Renal measurements: 13 x 4.5 x 4.9 cm = volume: 149 mL. Echogenicity within normal limits. Large masslike focus measuring 10.9 x  7.1 x 8.4 cm seen along the anterior margin of left kidney with some indentation of the renal parenchyma. Of note, a large subcapsular hematoma with seen in this vicinity previously. This could reflect some chronic hematoma or an underlying mass lesion, incompletely characterized on this exam. No hydronephrosis or shadowing calculus. Bladder: Appears normal for degree of bladder distention. Other: None. IMPRESSION: Large heterogeneous masslike focus abutting the left renal parenchyma. This is in the vicinity of a large hematoma seen on comparison imaging from 2021. Could reflect some recurrent or a degree of chronic hemorrhage though an underlying mass lesion is not fully excluded. Consider renal protocol CT or MR imaging for further characterization. Simple appearing cysts in the kidneys. Electronically Signed   By: Lovena Le M.D.   On: 03/04/2021 23:15   DG Chest Port 1 View  Result Date: 03/04/2021 CLINICAL DATA:  Syncope EXAM: PORTABLE CHEST 1 VIEW COMPARISON:  07/06/2020 FINDINGS: Mild cardiac enlargement. Left coronary stent. Negative for heart failure. Surgical clips left hilum from prior resection. Lungs well aerated and clear. No infiltrate or effusion. No change from the prior study. IMPRESSION: No active disease. Electronically Signed   By: Franchot Gallo M.D.   On: 03/04/2021 21:20        Scheduled Meds:  apixaban  2.5 mg Oral BID   bisoprolol  10 mg Oral Daily   dextrose       insulin aspart  0-5 Units Subcutaneous QHS   insulin aspart  0-9 Units Subcutaneous TID WC   mirtazapine  7.5 mg Oral QHS   multivitamin with minerals  1 tablet Oral Daily   predniSONE  5 mg Oral Q breakfast   rosuvastatin  20 mg Oral Daily   Continuous Infusions:  sodium chloride 100 mL/hr at 03/05/21 1412   sodium chloride            Aline August, MD [facility] 03/06/2021, 11:15 AM

## 2021-03-06 NOTE — Significant Event (Signed)
Confirmed with  Dietitian that tube is in the place for use.

## 2021-03-06 NOTE — Significant Event (Signed)
Patient N/G Cortak in with Xray complete with Recommendations to advance.  Paged Dietitian and made MD aware.

## 2021-03-06 NOTE — Progress Notes (Addendum)
+[id number] Nutrition Assessment  DOCUMENTATION CODES:   Underweight, Severe malnutrition in context of chronic illness  INTERVENTION:   -Ensure Enlive po BID, each supplement provides 350 kcal and 20 grams of protein  -MVI with minerals daily -Initiate Osmolite 1.2 @ 20 ml/hr via cortrak tube and increase by 10 ml every 12 hours to goal rate of 60 ml/hr.   45 ml Prosource TF TID.    150 ml free water flush every 4 hours  Tube feeding regimen provides 2280 kcal (100% of needs), 123 grams of protein, and 1097 ml of H2O.  Total free water: 1997 ml/ day  NUTRITION DIAGNOSIS:   Severe Malnutrition related to chronic illness (renal cell carcinoma) as evidenced by severe fat depletion, severe muscle depletion, percent weight loss.  GOAL:   Patient will meet greater than or equal to 90% of their needs  MONITOR:   PO intake, Supplement acceptance, Weight trends, Skin, TF tolerance, I & O's, Labs  REASON FOR ASSESSMENT:   Consult, Malnutrition Screening Tool Diet education  ASSESSMENT:   MIQUEL VESSEY is a 65 y.o. 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 fib on Eliquis, chronic anxiety, presented to [facility] ED from home due to dizziness, and poor oral intake.  History is mainly obtained from EDP, review of medical records and from his wife at bedside who reports that he has lost significant amount of weight due to poor appetite and poor oral intake.  Tonight she offered him a shake.  Reported episode of dizziness and sudden onset tremors, resolved in the ED.  EMS was activated.  CBG registered up as high.  Was brought into the ED for further evaluation.  While in the ED he is found to be hyperglycemic with metabolic acidosis.  Later after being admitted his beta hydroxybutyrate acid came back negative.   Initial venous blood gas pH of  7.2 and serum bicarb of 15.  Started on insulin drip and IV fluid hydration in the ED.  Admitted to hospitalist service.  Pt admitted with HHS.   Reviewed I/O's: +1.3 L x 24 hours and +2.3 L since admission  UOP: 975 ml x 24 hours  Spoke with pt at wife Hospital doctor) at bedside. Pt was very lethargic, but wife aroused and pt was able to participate briefly in interview by answering close ended questions. Most history deferred to wife. Wife reports that pt has experienced a 40# weight loss over the past year. Per wife, pt was placed on Ozempic about a year ago and he lost 20 pounds quickly when placed on the medication. Pt was diagnosed with renal cancer around this time. He was initially scheduled to have surgery in December 2021 to address this, however, was deferred due to pt having a stroke a week prior to surgery.   Wife shares that pt has experienced a general decline in health over the past 6 months secondary to stroke and depression secondary to deficits from stroke. Prior to illness, pt was employed as a Customer service manager and was an extremely social individual- hobbies included Science writer and serving on the board of directors at the [employer] [place]. Pt as recently started on medication for depression, however, wife has been minimal improvement in mood or appetite- he continues to sleep most of the day.   Per wife, intake is extremely minimal. He will usually eat breakfast (oatmeal mixed  with egg), however, it is a struggle for him to eat much else (remained of intake I usually a few bites of meat and vegetables and a protein shake made with Boost or protein powder, milk, and heavy cream). Wife has tried multiple supplements, but many have been discontinued due to her concern for hyperglycemia. Noted meal completions 25-100%.   Wife voiced concern over nutritional status. RD reviewed history with wife and expressed concern that while pt is able to take PO's, intake is inadequate (<1000 kcals daily).  RD dicussed options for nutrition support, including placing temporary feeding tube (cortrak). Wife is apprehensive to place PEG or permanent feeding access because "it's one more thing to deal with". RD provided emotional support and actively listened to her concerns. Wife amenable to temporary feeding tube placement. Case discussed with RN and MD, who gave RD verbal permission to place order for cortrak. RD also communicated to cortrak tube team for tube placement.   Reviewed wt hx; pt has experienced a 11.5% wt loss over the past 2 months, which is significant for time frame.   Medications reviewed and include remeron and prednisone.   Lab Results  Component Value Date   HGBA1C 6.8 (H) 12/12/2020   PTA DM medications are 1000 mg metformin BID and 2 mg glimpeiride daily (if blood sugar over 110).   Labs reviewed: CBGS: 66-171 (inpatient orders for glycemic control are 0-9 units insulin aspart TID with meals).    NUTRITION - FOCUSED PHYSICAL EXAM:  Flowsheet Row Most Recent Value  Orbital Region Severe depletion  Upper Arm Region Severe depletion  Thoracic and Lumbar Region Severe depletion  Buccal Region Severe depletion  Temple Region Severe depletion  Clavicle Bone Region Severe depletion  Clavicle and Acromion Bone Region Severe depletion  Scapular Bone Region Severe depletion  Dorsal Hand Severe depletion  Patellar Region Severe depletion  Anterior Thigh Region Severe depletion  Posterior Calf Region Severe depletion  Edema (RD Assessment) None  Hair Reviewed  Eyes Reviewed  Mouth Reviewed  Skin Reviewed  Nails Reviewed       Diet Order:   Diet Order             Diet heart healthy/carb modified Room service appropriate? Yes; Fluid consistency: Thin  Diet effective now                   EDUCATION NEEDS:   Education needs have been addressed  Skin:  Skin Assessment: Skin Integrity Issues: Skin Integrity Issues:: Stage I Stage I: coccyx  Last BM:   PTA  Height:   Ht Readings from Last 1 Encounters:  03/05/21 '5\' 11"'$  (1.803 m)    Weight:   Wt Readings from Last 1 Encounters:  03/06/21 60 kg    Ideal Body Weight:  78.2 kg  BMI:  Body mass index is 18.45 kg/m.  Estimated Nutritional Needs:   Kcal:  2200-2400  Protein:  120-135 grams  Fluid:  > 2 L    Loistine Chance, RD, LDN, [place] Registered Dietitian II Certified Diabetes Care and Education Specialist Please refer to [facility] for RD and/or RD on-call/weekend/after hours pager

## 2021-03-06 NOTE — Progress Notes (Signed)
OT Cancellation Note  Patient Details Name: Larry Garner MRN: [id number] DOB: 1956-01-01   Cancelled Treatment:    Reason Eval/Treat Not Completed: Fatigue/lethargy limiting ability to participate;Other (comment) Staff at bedside, difficulty awakening pt due to lethargy. Lunch also at bedside. Will follow-up for OT eval as schedule permits.  Layla Maw 03/06/2021, 12:22 PM

## 2021-03-06 NOTE — Telephone Encounter (Signed)
Spouse is calling back in regard.    Can an assistant give spouse a call today?

## 2021-03-06 NOTE — Telephone Encounter (Signed)
Team please see what questions they have-this will allow me to research before calling back-I am going to try to call either after seeing patients this afternoon or this evening when I am charting

## 2021-03-06 NOTE — Telephone Encounter (Signed)
Called and spoke with pt wife and she states she wants to know how you feel about referring pt to [facility] because she does not feel as if things are moving quick enough for them. She states pt has been given a feeding tube due to him not having an appetite and she would like to know your opinion on how else to help him as far as his weight is concerned. I advised pt wife that I would relay this information to you and you would be calling this even.

## 2021-03-06 NOTE — Telephone Encounter (Signed)
See below

## 2021-03-06 NOTE — Telephone Encounter (Signed)
LVM- do not think transfer to [facility] likely helpful. Likely will take time to help him recover energy/strength after this set back. Has not been doing well with PT/OT.   We did not specifically discuss this on message but I had mentioned palliative care or hospice- I think chatting with at least palliative care in hospital is the right next step

## 2021-03-07 DIAGNOSIS — E1165 Type 2 diabetes mellitus with hyperglycemia: Secondary | ICD-10-CM | POA: Diagnosis not present

## 2021-03-07 DIAGNOSIS — R627 Adult failure to thrive: Secondary | ICD-10-CM | POA: Diagnosis not present

## 2021-03-07 DIAGNOSIS — N179 Acute kidney failure, unspecified: Secondary | ICD-10-CM | POA: Diagnosis not present

## 2021-03-07 DIAGNOSIS — E1100 Type 2 diabetes mellitus with hyperosmolarity without nonketotic hyperglycemic-hyperosmolar coma (NKHHC): Secondary | ICD-10-CM | POA: Diagnosis not present

## 2021-03-07 LAB — CBC WITH DIFFERENTIAL/PLATELET
Abs Immature Granulocytes: 0.09 10*3/uL — ABNORMAL HIGH (ref 0.00–0.07)
Basophils Absolute: 0 10*3/uL (ref 0.0–0.1)
Basophils Relative: 0 %
Eosinophils Absolute: 0.1 10*3/uL (ref 0.0–0.5)
Eosinophils Relative: 1 %
HCT: 26.9 % — ABNORMAL LOW (ref 39.0–52.0)
Hemoglobin: 8.4 g/dL — ABNORMAL LOW (ref 13.0–17.0)
Immature Granulocytes: 1 %
Lymphocytes Relative: 7 %
Lymphs Abs: 1.1 10*3/uL (ref 0.7–4.0)
MCH: 26.6 pg (ref 26.0–34.0)
MCHC: 31.2 g/dL (ref 30.0–36.0)
MCV: 85.1 fL (ref 80.0–100.0)
Monocytes Absolute: 1.2 10*3/uL — ABNORMAL HIGH (ref 0.1–1.0)
Monocytes Relative: 8 %
Neutro Abs: 12.3 10*3/uL — ABNORMAL HIGH (ref 1.7–7.7)
Neutrophils Relative %: 83 %
Platelets: 333 10*3/uL (ref 150–400)
RBC: 3.16 MIL/uL — ABNORMAL LOW (ref 4.22–5.81)
RDW: 17.1 % — ABNORMAL HIGH (ref 11.5–15.5)
WBC: 14.8 10*3/uL — ABNORMAL HIGH (ref 4.0–10.5)
nRBC: 0 % (ref 0.0–0.2)

## 2021-03-07 LAB — BASIC METABOLIC PANEL
Anion gap: 10 (ref 5–15)
BUN: 62 mg/dL — ABNORMAL HIGH (ref 8–23)
CO2: 17 mmol/L — ABNORMAL LOW (ref 22–32)
Calcium: 9 mg/dL (ref 8.9–10.3)
Chloride: 110 mmol/L (ref 98–111)
Creatinine, Ser: 1.69 mg/dL — ABNORMAL HIGH (ref 0.61–1.24)
GFR, Estimated: 44 mL/min — ABNORMAL LOW (ref >60)
Glucose, Bld: 100 mg/dL — ABNORMAL HIGH (ref 70–99)
Potassium: 4.7 mmol/L (ref 3.5–5.1)
Sodium: 137 mmol/L (ref 135–145)

## 2021-03-07 LAB — GLUCOSE, CAPILLARY
Glucose-Capillary: 107 mg/dL — ABNORMAL HIGH (ref 70–99)
Glucose-Capillary: 99 mg/dL (ref 70–99)
Glucose-Capillary: 97 mg/dL (ref 70–99)
Glucose-Capillary: 247 mg/dL — ABNORMAL HIGH (ref 70–99)
Glucose-Capillary: 219 mg/dL — ABNORMAL HIGH (ref 70–99)
Glucose-Capillary: 213 mg/dL — ABNORMAL HIGH (ref 70–99)

## 2021-03-07 LAB — MAGNESIUM: Magnesium: 1.7 mg/dL (ref 1.7–2.4)

## 2021-03-07 LAB — AMMONIA: Ammonia: 29 umol/L (ref 9–35)

## 2021-03-07 LAB — PHOSPHORUS: Phosphorus: 3.5 mg/dL (ref 2.5–4.6)

## 2021-03-07 LAB — VITAMIN B12: Vitamin B-12: 849 pg/mL (ref 180–914)

## 2021-03-07 LAB — TSH: TSH: 0.781 u[IU]/mL (ref 0.350–4.500)

## 2021-03-07 MED ORDER — VITAMIN B-12 1000 MCG PO TABS
1000.0000 ug | ORAL_TABLET | Freq: Every day | ORAL | Status: DC
  Administered 2021-03-07 – 2021-03-15 (×9): 1000 ug via ORAL
  Filled 2021-03-07 (×9): qty 1

## 2021-03-07 NOTE — Progress Notes (Addendum)
NGT placed on R nare,pt tolerated well. Gastric return noted upon placement. Tube feeding started @ 53m/hr. Wife at bedside  tried to feed pt. Cortrak removed.

## 2021-03-07 NOTE — Progress Notes (Signed)
Coretrak placement verified by xray due to frequent coughing.  Per Dr Cyd Silence, ok to proceed with feedings.

## 2021-03-07 NOTE — Plan of Care (Signed)

## 2021-03-07 NOTE — Progress Notes (Signed)
Patient ID: Larry Garner, male   DOB: 1955-12-23, 65 y.o.   MRN: [id number]  PROGRESS NOTE    Larry Garner  [id number] DOB: [birth year] DOA: 03/04/2021 PCP: Marin Olp, MD   Brief Narrative:  65 y.o. 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 fib on Eliquis, chronic anxiety presented with dizziness and poor oral intake along with significant weight loss.  On presentation, patient was hyperglycemic with serum glucose of 664, bicarb of 15, creatinine of 2, potassium of 5.6, WBCs of 14.2.  He was started on IV fluids and insulin drip.  Assessment & Plan:   Hyperglycemic hyperosmolar state Dehydration Extremely poor oral intake Failure to thrive/weight loss Hypoglycemia -Patient has had progressively worsening weight loss with poor oral intake/appetite and hence was given milkshake by wife yesterday.  Subsequently patient was found to be hyperglycemic with serum glucose of 664 and bicarb of 15 with AKI. -He was initially started on insulin drip and IV fluids.  Subsequently he was transitioned to long-acting insulin.  Lantus was discontinued on 03/06/2021 because of hypoglycemia.  Blood sugars still fluctuating.  Will continue sliding scale insulin coverage. -Oral intake is still poor.  Patient used to be on desvenlafaxine till recently to improve his appetite but this has recently been discontinued because of increasing sleepiness. -Continue mirtazapine 7.5 mg q. nightly. -IV fluids as below -Cortrak was placed on 03/06/2021 as per dietitian recommendations but unfortunately got dislodged overnight.  Spoke to the wife at bedside and she has agreed for NG feeding for now.  Place NG tube and start NG feeding.  If this does not work, next option would be PEG tube feeding. -Also need to consider  outpatient palliative care evaluation and follow-up.  Acute kidney injury None anion gap metabolic acidosis/lactic acidosis -Bicarb 17 today.  Creatinine improving but still at 1.69 today.  Continue normal saline at 75 cc an hour.  Off bicarb drip. -Monitor creatinine  Hyperkalemia -Resolved  Leukocytosis -Possibly reactive.  No signs of infection.  Chest x-ray negative for infiltrates.  COVID and influenza testing were negative.  Empirically started on Rocephin on admission which was subsequently discontinued on 03/05/2021. -White count still elevated.  Monitor   Left renal cell carcinoma -Patient was supposed to have surgery in December 2021 but subsequently had to be postponed because of acute stroke.  Outpatient follow-up with urology/oncology at [facility]. -He is currently not a good candidate for undergoing surgery because of weight loss/poor oral intake  Doubt that patient has sepsis -Plan as above; cultures negative so far  Anemia of chronic disease -Hemoglobin stable.  Monitor.  No signs of bleeding  Hyperlipidemia -Continue statin  Chronic A. Fib -Rate controlled.  Continue Eliquis and bisoprolol  History of CAD status post CABG -Currently stable.  Outpatient follow-up with cardiology  Generalized deconditioning -PT recommends home health PT.  DVT prophylaxis: Eliquis Code Status: Full Family Communication: Wife at bedside on 03/07/2021 Disposition Plan: Status is: Inpatient  Remains inpatient appropriate because:Inpatient level of care appropriate due to severity of illness.  Will need several days of NG feeding and reassessment.  Dispo:  Patient From: Home  Planned Disposition: Home   Medically stable for discharge: No   Consultants: None  Procedures: None  Antimicrobials: Rocephin from 03/04/2021 onwards   Subjective: Patient seen and examined at bedside.  Poor historian.  No overnight fever, seizures, vomiting or worsening shortness of breath  reported.  Oral intake is still poor as per nursing staff.   Objective: Vitals:   03/06/21 1703 03/06/21 2018 03/07/21 0018 03/07/21 0511  BP: 119/79 120/83  118/62  Pulse: 71 75  86  Resp: '16 18  20  '$ Temp: (!) 97.5 F (36.4 C) 98.6 F (37 C)  (!) 97.5 F (36.4 C)  TempSrc: Oral Oral  Oral  SpO2: 100% 100%  100%  Weight:   62.6 kg   Height:        Intake/Output Summary (Last 24 hours) at 03/07/2021 1104 Last data filed at 03/07/2021 0802 Gross per 24 hour  Intake 2653.11 ml  Output 1025 ml  Net 1628.11 ml    Filed Weights   03/05/21 1032 03/06/21 0003 03/07/21 0018  Weight: 58 kg 60 kg 62.6 kg    Examination:  General exam: No acute distress.  Looks older than stated age and chronically ill.  Currently on room air.  Extremely thinly built Respiratory system: Bilateral decreased breath sounds at bases cardiovascular system: S1-S2 heard, rate controlled gastrointestinal system: Abdomen is mildly distended, soft and nontender.  Normal bowel sounds heard  extremities: No cyanosis or edema Central nervous system: Awake, answers a few basic questions.  Extremely slow to respond to questions.  No obvious focal neurological deficit.  Right-sided hemiparesis present Skin: No obvious petechiae/lesions  psychiatry: Affect is flat    Data Reviewed: I have personally reviewed following labs and imaging studies  CBC: Recent Labs  Lab 03/04/21 2052 03/04/21 2111 03/05/21 0504 03/05/21 1041 03/06/21 0258 03/07/21 0623  WBC 14.2*  --   --  12.4* 15.8* 14.8*  NEUTROABS 12.6*  --   --   --   --  12.3*  HGB 9.6* 10.9*  10.9* 10.9* 8.6* 9.4* 8.4*  HCT 32.6* 32.0*  32.0* 32.0* 26.9* 31.2* 26.9*  MCV 89.8  --   --  85.1 86.4 85.1  PLT 409*  --   --  342 444* 0000000    Basic Metabolic Panel: Recent Labs  Lab 03/05/21 0642 03/05/21 1041 03/05/21 1439 03/06/21 0258 03/07/21 0623  NA 137 140 138 137 137  K 4.5 4.4 4.8 4.7 4.7  CL 112* 110 108 107 110  CO2 19* 22 20* 20* 17*   GLUCOSE 90 113* 123* 66* 100*  BUN 87* 79* 80* 75* 62*  CREATININE 1.78* 1.78* 1.82* 1.77* 1.69*  CALCIUM 9.4 9.2 9.2 9.3 9.0  MG 1.8  --   --   --  1.7  PHOS 1.8*  --   --  3.0 3.5    GFR: Estimated Creatinine Clearance: 38.6 mL/min (A) (by C-G formula based on SCr of 1.69 mg/dL (H)). Liver Function Tests: Recent Labs  Lab 03/04/21 2052 03/06/21 0258  AST 17 16  ALT 23 19  ALKPHOS 202* 174*  BILITOT 0.8 1.0  PROT 7.0 6.6  ALBUMIN 2.0* 1.7*    No results for input(s): LIPASE, AMYLASE in the last 168 hours. Recent Labs  Lab 03/07/21 0623  AMMONIA 29   Coagulation Profile: No results for input(s): INR, PROTIME in the last 168 hours. Cardiac Enzymes: Recent Labs  Lab 03/04/21 2052  CKTOTAL <5*    BNP (last 3 results) No results for input(s): PROBNP in the last 8760 hours. HbA1C: No results for input(s): HGBA1C in the last 72 hours. CBG: Recent Labs  Lab 03/06/21 1700 03/06/21 2106 03/07/21 [id number] 03/07/21 [id number] 03/07/21 [id number]  GLUCAP 243* 233* 107* 99 97    Lipid Profile: No results for input(s): CHOL, HDL, LDLCALC, TRIG, CHOLHDL, LDLDIRECT in the last 72 hours. Thyroid Function Tests: Recent Labs    03/07/21 0623  TSH 0.781   Anemia Panel: Recent Labs    03/07/21 0623  VITAMINB12 849   Sepsis Labs: Recent Labs  Lab 03/04/21 2122 03/05/21 0131 03/05/21 0642 03/05/21 1041  PROCALCITON  --   --  0.49  --   LATICACIDVEN 2.5* 2.3* 1.3 1.3     Recent Results (from the past 240 hour(s))  Blood culture (routine x 2)     Status: None (Preliminary result)   Collection Time: 03/04/21  9:00 PM   Specimen: BLOOD  Result Value Ref Range Status   Specimen Description BLOOD LEFT ANTECUBITAL  Final   Special Requests   Final    BOTTLES DRAWN AEROBIC AND ANAEROBIC Blood Culture adequate volume   Culture   Final    NO GROWTH < 24 HOURS Performed at [facility], [place] [address], [place], [place] [address]    Report Status PENDING  Incomplete   Blood culture (routine x 2)     Status: None (Preliminary result)   Collection Time: 03/04/21  9:01 PM   Specimen: BLOOD RIGHT FOREARM  Result Value Ref Range Status   Specimen Description BLOOD RIGHT FOREARM  Final   Special Requests   Final    BOTTLES DRAWN AEROBIC AND ANAEROBIC Blood Culture adequate volume   Culture   Final    NO GROWTH < 24 HOURS Performed at [facility], [place] [address], [place], [place] [address]    Report Status PENDING  Incomplete  Resp Panel by RT-PCR (Flu A&B, Covid) Nasopharyngeal Swab     Status: None   Collection Time: 03/04/21 11:08 PM   Specimen: Nasopharyngeal Swab; Nasopharyngeal(NP) swabs in vial transport medium  Result Value Ref Range Status   SARS Coronavirus 2 by RT PCR NEGATIVE NEGATIVE Final    Comment: (NOTE) SARS-CoV-2 target nucleic acids are NOT DETECTED.  The SARS-CoV-2 RNA is generally detectable in upper respiratory specimens during the acute phase of infection. The lowest concentration of SARS-CoV-2 viral copies this assay can detect is 138 copies/mL. A negative result does not preclude SARS-Cov-2 infection and should not be used as the sole basis for treatment or other patient management decisions. A negative result may occur with  improper specimen collection/handling, submission of specimen other than nasopharyngeal swab, presence of viral mutation(s) within the areas targeted by this assay, and inadequate number of viral copies(<138 copies/mL). A negative result must be combined with clinical observations, patient history, and epidemiological information. The expected result is Negative.  Fact Sheet for Patients:  EntrepreneurPulse.com.au  Fact Sheet for Healthcare Providers:  IncredibleEmployment.be  This test is no t yet approved or cleared by the [place] FDA and  has been authorized for detection and/or diagnosis of SARS-CoV-2 by FDA under an Emergency Use Authorization  (EUA). This EUA will remain  in effect (meaning this test can be used) for the duration of the COVID-19 declaration under Section 564(b)(1) of the Act, 21 U.S.C.section 360bbb-3(b)(1), unless the authorization is terminated  or revoked sooner.       Influenza A by PCR NEGATIVE NEGATIVE Final   Influenza B by PCR NEGATIVE NEGATIVE Final    Comment: (NOTE) The Xpert Xpress SARS-CoV-2/FLU/RSV plus assay is intended as an aid in the diagnosis of influenza from Nasopharyngeal swab specimens and should  not be used as a sole basis for treatment. Nasal washings and aspirates are unacceptable for Xpert Xpress SARS-CoV-2/FLU/RSV testing.  Fact Sheet for Patients: EntrepreneurPulse.com.au  Fact Sheet for Healthcare Providers: IncredibleEmployment.be  This test is not yet approved or cleared by the [place] FDA and has been authorized for detection and/or diagnosis of SARS-CoV-2 by FDA under an Emergency Use Authorization (EUA). This EUA will remain in effect (meaning this test can be used) for the duration of the COVID-19 declaration under Section 564(b)(1) of the Act, 21 U.S.C. section 360bbb-3(b)(1), unless the authorization is terminated or revoked.  Performed at [facility], [place] [address], [place], [place] [address]           Radiology Studies: DG Abd Portable 1V  Result Date: 03/06/2021 CLINICAL DATA:  Feeding tube placement EXAM: PORTABLE ABDOMEN - 1 VIEW COMPARISON:  03/06/2021 FINDINGS: Feeding tube tip is in the distal stomach. Nonobstructive bowel gas pattern. IMPRESSION: Feeding tube tip in the distal stomach. Electronically Signed   By: Rolm Baptise M.D.   On: 03/06/2021 23:38   DG Abd Portable 1V  Result Date: 03/06/2021 CLINICAL DATA:  NG tube placement EXAM: PORTABLE ABDOMEN - 1 VIEW COMPARISON:  None. FINDINGS: Nonobstructive pattern of bowel gas. Non weighted enteric feeding tube is positioned with tip below the  diaphragm, tip in the vicinity of the pylorus or duodenal bulb. No free air in the abdomen on single supine radiograph. IMPRESSION: 1. Non weighted enteric feeding tube is positioned with tip below the diaphragm, tip in the vicinity of the pylorus or duodenal bulb. Recommend advancement to ensure post pyloric placement. 2. Nonobstructive pattern of bowel gas. No free air in the abdomen on single supine radiograph. Electronically Signed   By: Eddie Candle M.D.   On: 03/06/2021 16:17        Scheduled Meds:  apixaban  2.5 mg Oral BID   bisoprolol  10 mg Oral Daily   feeding supplement  237 mL Oral BID BM   feeding supplement (PROSource TF)  45 mL Per Tube BID   folic acid  1 mg Oral Daily   free water  150 mL Per Tube Q4H   insulin aspart  0-5 Units Subcutaneous QHS   insulin aspart  0-9 Units Subcutaneous TID WC   mirtazapine  7.5 mg Oral QHS   multivitamin with minerals  1 tablet Oral Daily   predniSONE  5 mg Oral Q breakfast   rosuvastatin  20 mg Oral Daily   Continuous Infusions:  sodium chloride 75 mL/hr at 03/06/21 1222   feeding supplement (OSMOLITE 1.5 CAL) Stopped (03/06/21 2143)   sodium chloride            Aline August, MD [facility] 03/07/2021, 11:04 AM

## 2021-03-07 NOTE — Progress Notes (Addendum)
Occupational Therapy Evaluation Patient Details Name: Larry Garner MRN: [id number] DOB: [birth year] Today's Date: 03/07/2021    History of Present Illness Pt is a 65 y/o male admitted 7/27 secondary to HHS in the setting of diet noncompliance and dehydration from poor oral intake and DKA. PMH includes CVA with R sided and cognitive deficits, CHF, DM, HTN, and renal cancer.   Clinical Impression   PTA pt lives at home with his wife, who assists with ADL tasks. Pt demonstrates a significant functional decline, requiring mod A with limited mobility at this time. Recommend follow up with HHOT and [facility] in addition to most likely needing to use a wc for mobility within the home if endurance and mobility does not improve. Wife is in agreement. Will follow acutely to facilitate safe DC home.  Recommend Palliative Care Consult    Follow Up Recommendations  Home health OT;Supervision/Assistance - 24 hour [facility] Aide)    Equipment Recommendations  3 in 1 bedside commode;Wheelchair (measurements OT);Wheelchair cushion (measurements OT);Hospital bed    Recommendations for Other Services       Precautions / Restrictions Precautions Precautions: Fall Precaution Comments: NG tube      Mobility Bed Mobility Overal bed mobility: Needs Assistance       Supine to sit: Min assist Sit to supine: Min assist        Transfers Overall transfer level: Needs assistance   Transfers: Sit to/from Stand Sit to Stand: Min assist         General transfer comment: difficulty side stepping due to sequencing deficits    Balance Overall balance assessment: Needs assistance   Sitting balance-Leahy Scale: Fair       Standing balance-Leahy Scale: Poor                             ADL either performed or assessed with clinical judgement   ADL Overall ADL's : Needs assistance/impaired   Eating/Feeding Details (indicate cue type and reason): NG tube Grooming: Supervision/safety;Set  up;Sitting   Upper Body Bathing: Minimal assistance;Sitting   Lower Body Bathing: Moderate assistance;Sit to/from stand   Upper Body Dressing : Moderate assistance;Sitting   Lower Body Dressing: Moderate assistance;Sit to/from stand   Toilet Transfer: Moderate assistance   Toileting- Clothing Manipulation and Hygiene: Maximal assistance Toileting - Clothing Manipulation Details (indicate cue type and reason): incontinenet at baseline; wears depends     Functional mobility during ADLs: Moderate assistance       Vision         Perception     Praxis      Pertinent Vitals/Pain Pain Assessment: Faces Faces Pain Scale: Hurts little more Pain Location: NG tube; generalized discomfort Pain Descriptors / Indicators: Discomfort;Grimacing Pain Intervention(s): Limited activity within patient's tolerance     Hand Dominance Right   Extremity/Trunk Assessment Upper Extremity Assessment Upper Extremity Assessment: Generalized weakness   Lower Extremity Assessment Lower Extremity Assessment: Defer to PT evaluation   Cervical / Trunk Assessment Cervical / Trunk Assessment: Kyphotic   Communication Communication Communication: Expressive difficulties   Cognition Arousal/Alertness: Awake/alert Behavior During Therapy: Flat affect Overall Cognitive Status: Impaired/Different from baseline Area of Impairment: Attention;Memory;Awareness;Problem solving                   Current Attention Level: Selective Memory: Decreased short-term memory     Awareness: Emergent Problem Solving: Slow processing;Decreased initiation;Difficulty sequencing     General Comments  cachectic appearance  Exercises Exercises: General Upper Extremity;General Lower Extremity;Other exercises General Exercises - Lower Extremity Short Arc Quad: 10 reps;AROM;Seated Other Exercises Other Exercises: Using B bedrails to pull self up into unsupported seated position Other Exercises: reaching  across [place] in unsupported sitting position to facilitate trunk strength   Shoulder Instructions      Home Living Family/patient expects to be discharged to:: Private residence Living Arrangements: Spouse/significant other Available Help at Discharge: Family;Available 24 hours/day Type of Home: House Home Access: Stairs to enter [employer] of Steps: 4   Home Layout: Two level Alternate Level Stairs-Number of Steps: 13 Alternate Level Stairs-Rails: Right Bathroom Shower/Tub: Teacher, early years/pre: Handicapped height Bathroom Accessibility: No   Home Equipment: Tub bench;Walker - 2 wheels;Walker - 4 wheels          Prior Functioning/Environment Level of Independence: Needs assistance  Gait / Transfers Assistance Needed: Uses RW for mobility. Uses cane for steps and wife assists with stair management. ADL's / Homemaking Assistance Needed: Requires assist with ADL tasks. Communication / Swallowing Assistance Needed: expressively aphasic          OT Problem List: Decreased strength;Decreased activity tolerance;Impaired balance (sitting and/or standing);Decreased cognition;Decreased safety awareness;Decreased knowledge of use of DME or AE;Cardiopulmonary status limiting activity;Pain      OT Treatment/Interventions: Self-care/ADL training;Therapeutic exercise;Neuromuscular education;Energy conservation;DME and/or AE instruction;Therapeutic activities;Cognitive remediation/compensation;Patient/family education;Balance training    OT Goals(Current goals can be found in the care plan section) Acute Rehab OT Goals Patient Stated Goal: to go home OT Goal Formulation: With patient/family Time For Goal Achievement: 03/21/21 Potential to Achieve Goals: Good  OT Frequency: Min 2X/week   Barriers to D/C:            Co-evaluation              AM-PAC OT "6 Clicks" Daily Activity     Outcome Measure Help from another person eating meals?: A  Lot Help from another person taking care of personal grooming?: A Little Help from another person toileting, which includes using toliet, bedpan, or urinal?: A Lot Help from another person bathing (including washing, rinsing, drying)?: A Lot Help from another person to put on and taking off regular upper body clothing?: A Lot Help from another person to put on and taking off regular lower body clothing?: A Lot 6 Click Score: 13   End of Session Nurse Communication: Mobility status  Activity Tolerance: Patient limited by fatigue Patient left: in bed;with call bell/phone within reach;with bed alarm set;with family/visitor present (chair position)  OT Visit Diagnosis: Unsteadiness on feet (R26.81);Other abnormalities of gait and mobility (R26.89);Muscle weakness (generalized) (M62.81);Other symptoms and signs involving cognitive function;Pain Pain - part of body:  (NG tube; general discomfort)                Time: 1357-1420 OT Time Calculation (min): 23 min Charges:  OT General Charges $OT Visit: 1 Visit OT Evaluation $OT Eval Moderate Complexity: 1 Mod OT Treatments $Self Care/Home Management : 8-22 mins  Maurie Boettcher, OT/L   Acute OT Clinical Specialist [place] Pager [phone] Office [phone]   [facility] 03/07/2021, 5:13 PM

## 2021-03-07 NOTE — Progress Notes (Addendum)
MD notified, patient has dislodged coretrak. Coretrak at 45cm at nose, not 75cm as when placement initially confirmed.  Patient is restless in bed and had pulled it while repositioning. Will inform day shift. Feedings are held at this time.

## 2021-03-08 DIAGNOSIS — E860 Dehydration: Secondary | ICD-10-CM | POA: Diagnosis not present

## 2021-03-08 DIAGNOSIS — E1165 Type 2 diabetes mellitus with hyperglycemia: Secondary | ICD-10-CM | POA: Diagnosis not present

## 2021-03-08 DIAGNOSIS — E1100 Type 2 diabetes mellitus with hyperosmolarity without nonketotic hyperglycemic-hyperosmolar coma (NKHHC): Secondary | ICD-10-CM | POA: Diagnosis not present

## 2021-03-08 DIAGNOSIS — R627 Adult failure to thrive: Secondary | ICD-10-CM | POA: Diagnosis not present

## 2021-03-08 LAB — BASIC METABOLIC PANEL
Anion gap: 10 (ref 5–15)
BUN: 62 mg/dL — ABNORMAL HIGH (ref 8–23)
CO2: 15 mmol/L — ABNORMAL LOW (ref 22–32)
Calcium: 8.8 mg/dL — ABNORMAL LOW (ref 8.9–10.3)
Chloride: 107 mmol/L (ref 98–111)
Creatinine, Ser: 1.75 mg/dL — ABNORMAL HIGH (ref 0.61–1.24)
GFR, Estimated: 43 mL/min — ABNORMAL LOW (ref >60)
Glucose, Bld: 160 mg/dL — ABNORMAL HIGH (ref 70–99)
Potassium: 5.1 mmol/L (ref 3.5–5.1)
Sodium: 132 mmol/L — ABNORMAL LOW (ref 135–145)

## 2021-03-08 LAB — GLUCOSE, CAPILLARY
Glucose-Capillary: 148 mg/dL — ABNORMAL HIGH (ref 70–99)
Glucose-Capillary: 155 mg/dL — ABNORMAL HIGH (ref 70–99)
Glucose-Capillary: 162 mg/dL — ABNORMAL HIGH (ref 70–99)
Glucose-Capillary: 197 mg/dL — ABNORMAL HIGH (ref 70–99)
Glucose-Capillary: 291 mg/dL — ABNORMAL HIGH (ref 70–99)
Glucose-Capillary: 312 mg/dL — ABNORMAL HIGH (ref 70–99)

## 2021-03-08 LAB — URINE CULTURE

## 2021-03-08 LAB — MAGNESIUM: Magnesium: 1.8 mg/dL (ref 1.7–2.4)

## 2021-03-08 MED ORDER — MIRTAZAPINE 15 MG PO TABS
15.0000 mg | ORAL_TABLET | Freq: Every day | ORAL | Status: DC
  Administered 2021-03-08 – 2021-03-14 (×7): 15 mg via ORAL
  Filled 2021-03-08 (×7): qty 1

## 2021-03-08 MED ORDER — STERILE WATER FOR INJECTION IV SOLN
INTRAVENOUS | Status: DC
  Filled 2021-03-08 (×3): qty 1000

## 2021-03-08 NOTE — Plan of Care (Signed)

## 2021-03-08 NOTE — Progress Notes (Signed)
Patient ID: Larry Garner, male   DOB: May 20, 1956, 65 y.o.   MRN: [id number]  PROGRESS NOTE    Larry Garner  [id number] DOB: [birth year] DOA: 03/04/2021 PCP: Marin Olp, MD   Brief Narrative:  65 y.o. 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 fib on Eliquis, chronic anxiety presented with dizziness and poor oral intake along with significant weight loss.  On presentation, patient was hyperglycemic with serum glucose of 664, bicarb of 15, creatinine of 2, potassium of 5.6, WBCs of 14.2.  He was started on IV fluids and insulin drip.  Assessment & Plan:   Hyperglycemic hyperosmolar state Dehydration Extremely poor oral intake Failure to thrive/weight loss Hypoglycemia -Patient has had progressively worsening weight loss with poor oral intake/appetite and hence was given milkshake by wife yesterday.  Subsequently patient was found to be hyperglycemic with serum glucose of 664 and bicarb of 15 with AKI. -He was initially started on insulin drip and IV fluids.  Subsequently he was transitioned to long-acting insulin.  Lantus was discontinued on 03/06/2021 because of hypoglycemia.  Blood sugars still fluctuating.  Will continue sliding scale insulin coverage. -Oral intake is still poor.  Patient used to be on desvenlafaxine till recently to improve his appetite but this has recently been discontinued because of increasing sleepiness. -Continue mirtazapine 7.5 mg q. nightly. -IV fluids as below -Cortrak was placed on 03/06/2021 as per dietitian recommendations but unfortunately got dislodged overnight.  Subsequently NG tube was placed on 03/07/2021 and NG feeding was started.  If this does not work, patient will need PEG tube. -Also need to consider outpatient palliative care evaluation and  follow-up.  Acute kidney injury None anion gap metabolic acidosis/lactic acidosis -Bicarb 15 today.  Creatinine 1.75 today.  DC normal saline.  Start bicarb drip for 24 hours at least. -Monitor creatinine  Hyperkalemia -Resolved  Leukocytosis -Possibly reactive.  No signs of infection.  Chest x-ray negative for infiltrates.  COVID and influenza testing were negative.  Empirically started on Rocephin on admission which was subsequently discontinued on 03/05/2021. -Work on pending for today.  Monitor.  Left renal cell carcinoma -Patient was supposed to have surgery in December 2021 but subsequently had to be postponed because of acute stroke.  Outpatient follow-up with urology/oncology at [facility]. -He is currently not a good candidate for undergoing surgery because of weight loss/poor oral intake  Doubt that patient has sepsis -Plan as above; cultures negative so far  Anemia of chronic disease -Hemoglobin stable.  Monitor.  No signs of bleeding  Hyperlipidemia -Continue statin  Chronic A. Fib -Rate controlled.  Continue Eliquis and bisoprolol  History of CAD status post CABG -Currently stable.  Outpatient follow-up with cardiology  Generalized deconditioning -PT recommends home health PT.  DVT prophylaxis: Eliquis Code Status: Full Family Communication: Wife at bedside on 03/07/2021 Disposition Plan: Status is: Inpatient  Remains inpatient appropriate because:Inpatient level of care appropriate due to severity of illness.  Will need several days of NG feeding and reassessment.  Dispo:  Patient From: Home  Planned Disposition: Home   Medically stable for discharge: No   Consultants: None  Procedures: None  Antimicrobials:  Anti-infectives (From admission, onward)    Start     Dose/Rate Route Frequency Ordered Stop   03/05/21 1000  valACYclovir (VALTREX) tablet 1,000 mg  Status:  Discontinued  1,000 mg Oral Daily 03/04/21 2305 03/05/21 0804   03/05/21 0800   cefTRIAXone (ROCEPHIN) 1 g in sodium chloride 0.9 % 100 mL IVPB  Status:  Discontinued        1 g 200 mL/hr over 30 Minutes Intravenous Every 24 hours 03/05/21 0612 03/05/21 1311         Subjective: Patient seen and examined at bedside.  Poor historian.  No vomiting, abdominal pain, worsening shortness of breath reported.   Objective: Vitals:   03/07/21 1127 03/07/21 2008 03/08/21 0420 03/08/21 0610  BP: 120/81 106/75 108/67   Pulse: 76 67 77   Resp: 18 (!) 24 20   Temp: 97.7 F (36.5 C) 98.5 F (36.9 C) (!) 100.5 F (38.1 C) 98.2 F (36.8 C)  TempSrc: Oral Oral Oral Oral  SpO2: 100% 98% 99%   Weight:   65.5 kg   Height:        Intake/Output Summary (Last 24 hours) at 03/08/2021 [id number] Last data filed at 03/08/2021 0424 Gross per 24 hour  Intake 3692.18 ml  Output 1075 ml  Net 2617.18 ml    Filed Weights   03/06/21 0003 03/07/21 0018 03/08/21 0420  Weight: 60 kg 62.6 kg 65.5 kg    Examination:  General exam: No distress.  Looks older than stated age and chronically ill.  Still on room air.  Extremely thinly built Respiratory system: Decreased breath sounds at bases bilaterally with some scattered crackles  cardiovascular system: Rate controlled, S1-S2 heard gastrointestinal system: Abdomen is distended mildly, soft and nontender.  Bowel sounds are heard  extremities: No clubbing or cyanosis.  Trace lower extremity edema present Central nervous system: Sleepy, wakes up slightly, hardly answers any questions.  No obvious focal neurological deficit.  Right-sided hemiparesis present Skin: No obvious ecchymosis/rashes  psychiatry: Flat affect when he wakes up    Data Reviewed: I have personally reviewed following labs and imaging studies  CBC: Recent Labs  Lab 03/04/21 2052 03/04/21 2111 03/05/21 0504 03/05/21 1041 03/06/21 0258 03/07/21 0623  WBC 14.2*  --   --  12.4* 15.8* 14.8*  NEUTROABS 12.6*  --   --   --   --  12.3*  HGB 9.6* 10.9*  10.9* 10.9* 8.6*  9.4* 8.4*  HCT 32.6* 32.0*  32.0* 32.0* 26.9* 31.2* 26.9*  MCV 89.8  --   --  85.1 86.4 85.1  PLT 409*  --   --  342 444* 0000000    Basic Metabolic Panel: Recent Labs  Lab 03/05/21 0642 03/05/21 1041 03/05/21 1439 03/06/21 0258 03/07/21 0623 03/08/21 0304  NA 137 140 138 137 137 132*  K 4.5 4.4 4.8 4.7 4.7 5.1  CL 112* 110 108 107 110 107  CO2 19* 22 20* 20* 17* 15*  GLUCOSE 90 113* 123* 66* 100* 160*  BUN 87* 79* 80* 75* 62* 62*  CREATININE 1.78* 1.78* 1.82* 1.77* 1.69* 1.75*  CALCIUM 9.4 9.2 9.2 9.3 9.0 8.8*  MG 1.8  --   --   --  1.7 1.8  PHOS 1.8*  --   --  3.0 3.5  --     GFR: Estimated Creatinine Clearance: 39 mL/min (A) (by C-G formula based on SCr of 1.75 mg/dL (H)). Liver Function Tests: Recent Labs  Lab 03/04/21 2052 03/06/21 0258  AST 17 16  ALT 23 19  ALKPHOS 202* 174*  BILITOT 0.8 1.0  PROT 7.0 6.6  ALBUMIN 2.0* 1.7*    No results for input(s): LIPASE, AMYLASE in  the last 168 hours. Recent Labs  Lab 03/07/21 0623  AMMONIA 29    Coagulation Profile: No results for input(s): INR, PROTIME in the last 168 hours. Cardiac Enzymes: Recent Labs  Lab 03/04/21 2052  CKTOTAL <5*    BNP (last 3 results) No results for input(s): PROBNP in the last 8760 hours. HbA1C: No results for input(s): HGBA1C in the last 72 hours. CBG: Recent Labs  Lab 03/07/21 1702 03/07/21 2010 03/08/21 0017 03/08/21 0421 03/08/21 0716  GLUCAP 219* 213* 162* 148* 155*    Lipid Profile: No results for input(s): CHOL, HDL, LDLCALC, TRIG, CHOLHDL, LDLDIRECT in the last 72 hours. Thyroid Function Tests: Recent Labs    03/07/21 0623  TSH 0.781    Anemia Panel: Recent Labs    03/07/21 0623  VITAMINB12 849    Sepsis Labs: Recent Labs  Lab 03/04/21 2122 03/05/21 0131 03/05/21 0642 03/05/21 1041  PROCALCITON  --   --  0.49  --   LATICACIDVEN 2.5* 2.3* 1.3 1.3     Recent Results (from the past 240 hour(s))  Blood culture (routine x 2)     Status: None  (Preliminary result)   Collection Time: 03/04/21  9:00 PM   Specimen: BLOOD  Result Value Ref Range Status   Specimen Description BLOOD LEFT ANTECUBITAL  Final   Special Requests   Final    BOTTLES DRAWN AEROBIC AND ANAEROBIC Blood Culture adequate volume   Culture   Final    NO GROWTH 3 DAYS Performed at [facility], [place] [address] [place], [place] [address]    Report Status PENDING  Incomplete  Blood culture (routine x 2)     Status: None (Preliminary result)   Collection Time: 03/04/21  9:01 PM   Specimen: BLOOD RIGHT FOREARM  Result Value Ref Range Status   Specimen Description BLOOD RIGHT FOREARM  Final   Special Requests   Final    BOTTLES DRAWN AEROBIC AND ANAEROBIC Blood Culture adequate volume   Culture   Final    NO GROWTH 3 DAYS Performed at [facility], [place] [address] [place], [place] [address]    Report Status PENDING  Incomplete  Resp Panel by RT-PCR (Flu A&B, Covid) Nasopharyngeal Swab     Status: None   Collection Time: 03/04/21 11:08 PM   Specimen: Nasopharyngeal Swab; Nasopharyngeal(NP) swabs in vial transport medium  Result Value Ref Range Status   SARS Coronavirus 2 by RT PCR NEGATIVE NEGATIVE Final    Comment: (NOTE) SARS-CoV-2 target nucleic acids are NOT DETECTED.  The SARS-CoV-2 RNA is generally detectable in upper respiratory specimens during the acute phase of infection. The lowest concentration of SARS-CoV-2 viral copies this assay can detect is 138 copies/mL. A negative result does not preclude SARS-Cov-2 infection and should not be used as the sole basis for treatment or other patient management decisions. A negative result may occur with  improper specimen collection/handling, submission of specimen other than nasopharyngeal swab, presence of viral mutation(s) within the areas targeted by this assay, and inadequate number of viral copies(<138 copies/mL). A negative result must be combined with clinical observations, patient  history, and epidemiological information. The expected result is Negative.  Fact Sheet for Patients:  EntrepreneurPulse.com.au  Fact Sheet for Healthcare Providers:  IncredibleEmployment.be  This test is no t yet approved or cleared by the [place] FDA and  has been authorized for detection and/or diagnosis of SARS-CoV-2 by FDA under an Emergency Use Authorization (EUA). This EUA will  remain  in effect (meaning this test can be used) for the duration of the COVID-19 declaration under Section 564(b)(1) of the Act, 21 U.S.C.section 360bbb-3(b)(1), unless the authorization is terminated  or revoked sooner.       Influenza A by PCR NEGATIVE NEGATIVE Final   Influenza B by PCR NEGATIVE NEGATIVE Final    Comment: (NOTE) The Xpert Xpress SARS-CoV-2/FLU/RSV plus assay is intended as an aid in the diagnosis of influenza from Nasopharyngeal swab specimens and should not be used as a sole basis for treatment. Nasal washings and aspirates are unacceptable for Xpert Xpress SARS-CoV-2/FLU/RSV testing.  Fact Sheet for Patients: EntrepreneurPulse.com.au  Fact Sheet for Healthcare Providers: IncredibleEmployment.be  This test is not yet approved or cleared by the [place] FDA and has been authorized for detection and/or diagnosis of SARS-CoV-2 by FDA under an Emergency Use Authorization (EUA). This EUA will remain in effect (meaning this test can be used) for the duration of the COVID-19 declaration under Section 564(b)(1) of the Act, 21 U.S.C. section 360bbb-3(b)(1), unless the authorization is terminated or revoked.  Performed at [facility], [place] [address], [place], [place] [address]           Radiology Studies: DG Abd Portable 1V  Result Date: 03/06/2021 CLINICAL DATA:  Feeding tube placement EXAM: PORTABLE ABDOMEN - 1 VIEW COMPARISON:  03/06/2021 FINDINGS: Feeding tube tip is in the  distal stomach. Nonobstructive bowel gas pattern. IMPRESSION: Feeding tube tip in the distal stomach. Electronically Signed   By: Rolm Baptise M.D.   On: 03/06/2021 23:38   DG Abd Portable 1V  Result Date: 03/06/2021 CLINICAL DATA:  NG tube placement EXAM: PORTABLE ABDOMEN - 1 VIEW COMPARISON:  None. FINDINGS: Nonobstructive pattern of bowel gas. Non weighted enteric feeding tube is positioned with tip below the diaphragm, tip in the vicinity of the pylorus or duodenal bulb. No free air in the abdomen on single supine radiograph. IMPRESSION: 1. Non weighted enteric feeding tube is positioned with tip below the diaphragm, tip in the vicinity of the pylorus or duodenal bulb. Recommend advancement to ensure post pyloric placement. 2. Nonobstructive pattern of bowel gas. No free air in the abdomen on single supine radiograph. Electronically Signed   By: Eddie Candle M.D.   On: 03/06/2021 16:17        Scheduled Meds:  apixaban  2.5 mg Oral BID   bisoprolol  10 mg Oral Daily   feeding supplement  237 mL Oral BID BM   feeding supplement (PROSource TF)  45 mL Per Tube BID   folic acid  1 mg Oral Daily   free water  150 mL Per Tube Q4H   insulin aspart  0-5 Units Subcutaneous QHS   insulin aspart  0-9 Units Subcutaneous TID WC   mirtazapine  7.5 mg Oral QHS   multivitamin with minerals  1 tablet Oral Daily   predniSONE  5 mg Oral Q breakfast   rosuvastatin  20 mg Oral Daily   cyanocobalamin  1,000 mcg Oral Daily   Continuous Infusions:  sodium chloride 75 mL/hr at 03/08/21 0118   feeding supplement (OSMOLITE 1.5 CAL) 1,000 mL (03/07/21 1235)   sodium chloride            Aline August, MD [facility] 03/08/2021, 8:22 AM

## 2021-03-08 NOTE — Significant Event (Signed)
Patient pulled NG tube out. Made MD aware.

## 2021-03-09 DIAGNOSIS — R627 Adult failure to thrive: Secondary | ICD-10-CM | POA: Diagnosis not present

## 2021-03-09 DIAGNOSIS — E860 Dehydration: Secondary | ICD-10-CM | POA: Diagnosis not present

## 2021-03-09 DIAGNOSIS — E1100 Type 2 diabetes mellitus with hyperosmolarity without nonketotic hyperglycemic-hyperosmolar coma (NKHHC): Secondary | ICD-10-CM | POA: Diagnosis not present

## 2021-03-09 DIAGNOSIS — N179 Acute kidney failure, unspecified: Secondary | ICD-10-CM | POA: Diagnosis not present

## 2021-03-09 LAB — CULTURE, BLOOD (ROUTINE X 2)
Culture: NO GROWTH
Culture: NO GROWTH
Special Requests: ADEQUATE
Special Requests: ADEQUATE

## 2021-03-09 LAB — COMPREHENSIVE METABOLIC PANEL
ALT: 33 U/L (ref 0–44)
AST: 47 U/L — ABNORMAL HIGH (ref 15–41)
Albumin: 1.8 g/dL — ABNORMAL LOW (ref 3.5–5.0)
Alkaline Phosphatase: 280 U/L — ABNORMAL HIGH (ref 38–126)
Anion gap: 12 (ref 5–15)
BUN: 59 mg/dL — ABNORMAL HIGH (ref 8–23)
CO2: 17 mmol/L — ABNORMAL LOW (ref 22–32)
Calcium: 8.9 mg/dL (ref 8.9–10.3)
Chloride: 106 mmol/L (ref 98–111)
Creatinine, Ser: 1.77 mg/dL — ABNORMAL HIGH (ref 0.61–1.24)
GFR, Estimated: 42 mL/min — ABNORMAL LOW (ref >60)
Glucose, Bld: 74 mg/dL (ref 70–99)
Potassium: 4.9 mmol/L (ref 3.5–5.1)
Sodium: 135 mmol/L (ref 135–145)
Total Bilirubin: 0.8 mg/dL (ref 0.3–1.2)
Total Protein: 6.3 g/dL — ABNORMAL LOW (ref 6.5–8.1)

## 2021-03-09 LAB — CBC WITH DIFFERENTIAL/PLATELET
Abs Immature Granulocytes: 0.25 10*3/uL — ABNORMAL HIGH (ref 0.00–0.07)
Basophils Absolute: 0 10*3/uL (ref 0.0–0.1)
Basophils Relative: 0 %
Eosinophils Absolute: 0.1 10*3/uL (ref 0.0–0.5)
Eosinophils Relative: 1 %
HCT: 27.5 % — ABNORMAL LOW (ref 39.0–52.0)
Hemoglobin: 8.5 g/dL — ABNORMAL LOW (ref 13.0–17.0)
Immature Granulocytes: 2 %
Lymphocytes Relative: 9 %
Lymphs Abs: 1.4 10*3/uL (ref 0.7–4.0)
MCH: 26.6 pg (ref 26.0–34.0)
MCHC: 30.9 g/dL (ref 30.0–36.0)
MCV: 86.2 fL (ref 80.0–100.0)
Monocytes Absolute: 1.1 10*3/uL — ABNORMAL HIGH (ref 0.1–1.0)
Monocytes Relative: 7 %
Neutro Abs: 13.1 10*3/uL — ABNORMAL HIGH (ref 1.7–7.7)
Neutrophils Relative %: 81 %
Platelets: 321 10*3/uL (ref 150–400)
RBC: 3.19 MIL/uL — ABNORMAL LOW (ref 4.22–5.81)
RDW: 17.2 % — ABNORMAL HIGH (ref 11.5–15.5)
WBC: 15.9 10*3/uL — ABNORMAL HIGH (ref 4.0–10.5)
nRBC: 0 % (ref 0.0–0.2)

## 2021-03-09 LAB — MAGNESIUM: Magnesium: 1.8 mg/dL (ref 1.7–2.4)

## 2021-03-09 LAB — GLUCOSE, CAPILLARY
Glucose-Capillary: 105 mg/dL — ABNORMAL HIGH (ref 70–99)
Glucose-Capillary: 127 mg/dL — ABNORMAL HIGH (ref 70–99)
Glucose-Capillary: 203 mg/dL — ABNORMAL HIGH (ref 70–99)
Glucose-Capillary: 228 mg/dL — ABNORMAL HIGH (ref 70–99)
Glucose-Capillary: 246 mg/dL — ABNORMAL HIGH (ref 70–99)
Glucose-Capillary: 74 mg/dL (ref 70–99)
Glucose-Capillary: 94 mg/dL (ref 70–99)

## 2021-03-09 MED ORDER — ENSURE ENLIVE PO LIQD
237.0000 mL | Freq: Three times a day (TID) | ORAL | Status: DC
  Administered 2021-03-09 – 2021-03-15 (×11): 237 mL via ORAL

## 2021-03-09 NOTE — Progress Notes (Signed)
Occupational Therapy Treatment Patient Details Name: Larry Garner MRN: [id number] DOB: 1956-01-03 Today's Date: 03/09/2021    History of present illness Pt is a 65 y/o male admitted 7/27 secondary to HHS in the setting of diet noncompliance and dehydration from poor oral intake and DKA. PMH includes CVA with R sided and cognitive deficits, CHF, DM, HTN, and renal cancer.   OT comments  OT entering room as chair alarm sounding to find pt sitting on extended recliner footrest and attempting to get back to bed unassisted. Redirected pt to safe position prior to standing with bowel incontinence noted. Pt overall Min A for transfers using RW to [facility] and bed afterwards with difficulty following directions. Pt Min A for UB/LB dressing and Max A for posterior hygiene due to difficulty problem solving. Provided UE HEP with Mod A for completion. Encouraged pt to go through exercises again with wife later today. DC recs remain appropriate.    Follow Up Recommendations  Home health OT;Supervision/Assistance - 24 hour [facility] aide)    Equipment Recommendations  3 in 1 bedside commode;Wheelchair (measurements OT);Wheelchair cushion (measurements OT);Hospital bed    Recommendations for Other Services      Precautions / Restrictions Precautions Precautions: Fall Restrictions Weight Bearing Restrictions: No       Mobility Bed Mobility Overal bed mobility: Needs Assistance Bed Mobility: Sit to Supine       Sit to supine: Min assist   General bed mobility comments: Min A to get LE back into bed    Transfers Overall transfer level: Needs assistance Equipment used: Rolling walker (2 wheeled) Transfers: Sit to/from [facility] Sit to Stand: Min guard Stand pivot transfers: Min assist       General transfer comment: min guard with increased time/effort for sit to stands from recliner and BSC. Min A for manuevering RW during pivots to [facility] and bed    Balance Overall balance  assessment: Needs assistance Sitting-balance support: No upper extremity supported;Feet supported Sitting balance-Leahy Scale: Fair     Standing balance support: Single extremity supported;Bilateral upper extremity supported Standing balance-Leahy Scale: Poor Standing balance comment: reliant on UE support of walker                           ADL either performed or assessed with clinical judgement   ADL Overall ADL's : Needs assistance/impaired                 Upper Body Dressing : Minimal assistance;Sitting Upper Body Dressing Details (indicate cue type and reason): Min A with cues for sequencing as pt putting UE in wrong spot when changing soiled gowns x 2 Lower Body Dressing: Minimal assistance Lower Body Dressing Details (indicate cue type and reason): assist to doff soiled socks but able to don clean socks sitting EOB without assist Toilet Transfer: Minimal assistance;Stand-pivot;BSC;RW Toilet Transfer Details (indicate cue type and reason): Min A for manueveirng RW to [facility] and then to bed Toileting- Clothing Manipulation and Hygiene: Maximal assistance;Sit to/from stand Toileting - Clothing Manipulation Details (indicate cue type and reason): Pt attempting to assist with hygiene after bowel incontinence but ending up soiling gown. Max A for hygiene       General ADL Comments: Limited by confusion, bowel incontinence     Vision   Vision Assessment?: No apparent visual deficits   Perception     Praxis      Cognition Arousal/Alertness: Awake/alert Behavior During Therapy: Impulsive;WFL for tasks  assessed/performed Overall Cognitive Status: Impaired/Different from baseline Area of Impairment: Attention;Memory;Awareness;Problem solving;Orientation;Safety/judgement;Following commands                 Orientation Level: Disoriented to;Time Current Attention Level: Selective Memory: Decreased short-term memory Following Commands: Follows one step  commands with increased time;Follows multi-step commands inconsistently Safety/Judgement: Decreased awareness of safety;Decreased awareness of deficits Awareness: Emergent Problem Solving: Slow processing;Decreased initiation;Difficulty sequencing General Comments: on entry, pt attempting to exit chair with footrest up and chair alarm sounding. pt with difficulty following directions, fluctuating confusion since CVA but pleasant        Exercises General Exercises - Lower Extremity Ankle Circles/Pumps: AROM;Strengthening;Both;10 reps;Seated;Limitations Ankle Circles/Pumps Limitations: with foot rest up and manual resistance for PF Long Arc Quad: AROM;Strengthening;Both;10 reps;Seated Toe Raises: AROM;Strengthening;Both;10 reps;Seated Heel Raises: AROM;Strengthening;Both;10 reps;Seated   Shoulder Instructions       General Comments Educated on UE HEP with Mod A needed. Wife seen in hallway after treatment - educated on session, HEP handout in room, etc    Pertinent Vitals/ Pain       Pain Assessment: No/denies pain Pain Intervention(s): Monitored during session  Home Living                                          Prior Functioning/Environment              Frequency  Min 2X/week        Progress Toward Goals  OT Goals(current goals can now be found in the care plan section)  Progress towards OT goals: Progressing toward goals  Acute Rehab OT Goals Patient Stated Goal: to go home OT Goal Formulation: With patient/family Time For Goal Achievement: 03/21/21 Potential to Achieve Goals: Good ADL Goals Pt Will Transfer to Toilet: with min assist;ambulating;bedside commode Pt/caregiver will Perform Home Exercise Program: Increased strength;Both right and left upper extremity;With theraband;With Supervision Additional ADL Goal #1: pt/caregiver will verbalize 3 strategies to reduce risk of falls  Plan Discharge plan remains appropriate     Co-evaluation                 AM-PAC OT "6 Clicks" Daily Activity     Outcome Measure   Help from another person eating meals?: A Little Help from another person taking care of personal grooming?: A Little Help from another person toileting, which includes using toliet, bedpan, or urinal?: A Lot Help from another person bathing (including washing, rinsing, drying)?: A Lot Help from another person to put on and taking off regular upper body clothing?: A Little Help from another person to put on and taking off regular lower body clothing?: A Lot 6 Click Score: 15    End of Session Equipment Utilized During Treatment: Rolling walker  OT Visit Diagnosis: Unsteadiness on feet (R26.81);Other abnormalities of gait and mobility (R26.89);Muscle weakness (generalized) (M62.81);Other symptoms and signs involving cognitive function;Pain   Activity Tolerance Patient tolerated treatment well   Patient Left in bed;with call bell/phone within reach;with bed alarm set   Nurse Communication Mobility status        Time: [id number] OT Time Calculation (min): 31 min  Charges: OT General Charges $OT Visit: 1 Visit OT Treatments $Self Care/Home Management : 8-22 mins $Therapeutic Exercise: 8-22 mins  Malachy Chamber, OTR/L Acute Rehab Services Office: [phone]    Layla Maw 03/09/2021, 2:37 PM

## 2021-03-09 NOTE — Progress Notes (Signed)
Patient ID: Larry Garner, male   DOB: Dec 27, 1955, 65 y.o.   MRN: [id number]  PROGRESS NOTE    Larry Garner  [id number] DOB: 1956-02-12 DOA: 03/04/2021 PCP: Marin Olp, MD   Brief Narrative:  65 y.o. 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 fib on Eliquis, chronic anxiety presented with dizziness and poor oral intake along with significant weight loss.  On presentation, patient was hyperglycemic with serum glucose of 664, bicarb of 15, creatinine of 2, potassium of 5.6, WBCs of 14.2.  He was started on IV fluids and insulin drip.  Assessment & Plan:   Hyperglycemic hyperosmolar state Dehydration Extremely poor oral intake Failure to thrive/weight loss Hypoglycemia -Patient has had progressively worsening weight loss with poor oral intake/appetite and hence was given milkshake by wife yesterday.  Subsequently patient was found to be hyperglycemic with serum glucose of 664 and bicarb of 15 with AKI. -He was initially started on insulin drip and IV fluids.  Subsequently he was transitioned to long-acting insulin.  Lantus was discontinued on 03/06/2021 because of hypoglycemia.  Blood sugars still fluctuating.  Will continue sliding scale insulin coverage. -Oral intake is still poor.  Patient used to be on desvenlafaxine till recently to improve his appetite but this has recently been discontinued because of increasing sleepiness. -Increased mirtazapine to 15 mg q. nightly. -IV fluids as below -Cortrak was placed on 03/06/2021 as per dietitian recommendations but unfortunately got dislodged overnight.  Subsequently NG tube was placed on 03/07/2021 and NG feeding was started.  NG tube got dislodged on 03/08/2021 and subsequently patient did not want it reinserted.  Patient will probably need a  PEG tube -Also need to consider outpatient palliative care evaluation and follow-up.  Acute kidney injury None anion gap metabolic acidosis/lactic acidosis -Bicarb 17 today.  Creatinine 1.77 today.  Continue bicarb drip for today -Monitor creatinine  Hyperkalemia -Resolved  Leukocytosis -Possibly reactive.  No signs of infection.  Chest x-ray negative for infiltrates.  COVID and influenza testing were negative.  Empirically started on Rocephin on admission which was subsequently discontinued on 03/05/2021. -Still has significant leukocytosis.  Monitor.  Left renal cell carcinoma -Patient was supposed to have surgery in December 2021 but subsequently had to be postponed because of acute stroke.  Outpatient follow-up with urology/oncology at [facility] -He is currently not a good candidate for undergoing surgery because of weight loss/poor oral intake  Doubt that patient has sepsis -Plan as above; cultures negative so far  Anemia of chronic disease -Hemoglobin stable.  Monitor.  No signs of bleeding  Hyperlipidemia -Continue statin  Chronic A. Fib -Rate controlled.  Continue Eliquis and bisoprolol  History of CAD status post CABG -Currently stable.  Outpatient follow-up with cardiology  Generalized deconditioning -PT recommends home health PT.  DVT prophylaxis: Eliquis Code Status: Full Family Communication: Wife at bedside on 03/07/2021 Disposition Plan: Status is: Inpatient  Remains inpatient appropriate because:Inpatient level of care appropriate due to severity of illness.  Will need several days of NG feeding and reassessment.  Dispo:  Patient From: Home  Planned Disposition: Home   Medically stable for discharge: No   Consultants: None  Procedures: None  Antimicrobials:  Anti-infectives (From admission, onward)    Start     Dose/Rate Route Frequency Ordered Stop   03/05/21 1000  valACYclovir (VALTREX) tablet 1,000 mg  Status:  Discontinued        1,000 mg  Oral Daily 03/04/21 2305 03/05/21 0804   03/05/21 0800  cefTRIAXone (ROCEPHIN) 1 g in sodium chloride 0.9 % 100 mL IVPB  Status:  Discontinued        1 g 200 mL/hr over 30 Minutes Intravenous Every 24 hours 03/05/21 0612 03/05/21 1311         Subjective: Patient seen and examined at bedside.  Poor historian.  No worsening shortness of breath, fever or vomiting reported. Objective: Vitals:   03/08/21 0610 03/08/21 1228 03/08/21 2027 03/09/21 0433  BP:  120/67 110/75 116/67  Pulse:  74 68 75  Resp:  '16 18 18  '$ Temp: 98.2 F (36.8 C) 97.8 F (36.6 C) 97.6 F (36.4 C) (!) 97.5 F (36.4 C)  TempSrc: Oral Oral Oral Oral  SpO2:  100% 100% 100%  Weight:    65.6 kg  Height:        Intake/Output Summary (Last 24 hours) at 03/09/2021 0805 Last data filed at 03/09/2021 0436 Gross per 24 hour  Intake 3091.13 ml  Output 850 ml  Net 2241.13 ml    Filed Weights   03/07/21 0018 03/08/21 0420 03/09/21 0433  Weight: 62.6 kg 65.5 kg 65.6 kg    Examination:  General exam: Currently still on room air.  No acute distress.  Looks older than stated age and chronically ill.  Extremely thinly built Respiratory system: Bilateral decreased breath sounds at bases with scattered crackles  cardiovascular system: S1-S2 heard, rate controlled gastrointestinal system: Abdomen is distended slightly, soft and nontender.  Normal bowel sounds heard  extremities: Mild lower extremity edema present; no edema.   Central nervous system: Extremely slow to respond, answers some basic questions.  No obvious focal neurological deficit.  Right-sided hemiparesis present Skin: No obvious petechiae/rashes psychiatry: Affect is flat   Data Reviewed: I have personally reviewed following labs and imaging studies  CBC: Recent Labs  Lab 03/04/21 2052 03/04/21 2111 03/05/21 0504 03/05/21 1041 03/06/21 0258 03/07/21 0623 03/09/21 0215  WBC 14.2*  --   --  12.4* 15.8* 14.8* 15.9*  NEUTROABS 12.6*  --   --   --    --  12.3* 13.1*  HGB 9.6*   < > 10.9* 8.6* 9.4* 8.4* 8.5*  HCT 32.6*   < > 32.0* 26.9* 31.2* 26.9* 27.5*  MCV 89.8  --   --  85.1 86.4 85.1 86.2  PLT 409*  --   --  342 444* 333 321   < > = values in this interval not displayed.    Basic Metabolic Panel: Recent Labs  Lab 03/05/21 0642 03/05/21 1041 03/05/21 1439 03/06/21 0258 03/07/21 0623 03/08/21 0304 03/09/21 0215  NA 137   < > 138 137 137 132* 135  K 4.5   < > 4.8 4.7 4.7 5.1 4.9  CL 112*   < > 108 107 110 107 106  CO2 19*   < > 20* 20* 17* 15* 17*  GLUCOSE 90   < > 123* 66* 100* 160* 74  BUN 87*   < > 80* 75* 62* 62* 59*  CREATININE 1.78*   < > 1.82* 1.77* 1.69* 1.75* 1.77*  CALCIUM 9.4   < > 9.2 9.3 9.0 8.8* 8.9  MG 1.8  --   --   --  1.7 1.8 1.8  PHOS 1.8*  --   --  3.0 3.5  --   --    < > =  values in this interval not displayed.    GFR: Estimated Creatinine Clearance: 38.6 mL/min (A) (by C-G formula based on SCr of 1.77 mg/dL (H)). Liver Function Tests: Recent Labs  Lab 03/04/21 2052 03/06/21 0258 03/09/21 0215  AST 17 16 47*  ALT 23 19 33  ALKPHOS 202* 174* 280*  BILITOT 0.8 1.0 0.8  PROT 7.0 6.6 6.3*  ALBUMIN 2.0* 1.7* 1.8*    No results for input(s): LIPASE, AMYLASE in the last 168 hours. Recent Labs  Lab 03/07/21 0623  AMMONIA 29    Coagulation Profile: No results for input(s): INR, PROTIME in the last 168 hours. Cardiac Enzymes: Recent Labs  Lab 03/04/21 2052  CKTOTAL <5*    BNP (last 3 results) No results for input(s): PROBNP in the last 8760 hours. HbA1C: No results for input(s): HGBA1C in the last 72 hours. CBG: Recent Labs  Lab 03/08/21 1549 03/08/21 2030 03/09/21 0008 03/09/21 0433 03/09/21 0739  GLUCAP 291* 197* 105* 74 94    Lipid Profile: No results for input(s): CHOL, HDL, LDLCALC, TRIG, CHOLHDL, LDLDIRECT in the last 72 hours. Thyroid Function Tests: Recent Labs    03/07/21 0623  TSH 0.781    Anemia Panel: Recent Labs    03/07/21 0623  VITAMINB12 849     Sepsis Labs: Recent Labs  Lab 03/04/21 2122 03/05/21 0131 03/05/21 0642 03/05/21 1041  PROCALCITON  --   --  0.49  --   LATICACIDVEN 2.5* 2.3* 1.3 1.3     Recent Results (from the past 240 hour(s))  Blood culture (routine x 2)     Status: None (Preliminary result)   Collection Time: 03/04/21  9:00 PM   Specimen: BLOOD  Result Value Ref Range Status   Specimen Description BLOOD LEFT ANTECUBITAL  Final   Special Requests   Final    BOTTLES DRAWN AEROBIC AND ANAEROBIC Blood Culture adequate volume   Culture   Final    NO GROWTH 4 DAYS Performed at [facility], [place] [address] [place], [place] [address]    Report Status PENDING  Incomplete  Blood culture (routine x 2)     Status: None (Preliminary result)   Collection Time: 03/04/21  9:01 PM   Specimen: BLOOD RIGHT FOREARM  Result Value Ref Range Status   Specimen Description BLOOD RIGHT FOREARM  Final   Special Requests   Final    BOTTLES DRAWN AEROBIC AND ANAEROBIC Blood Culture adequate volume   Culture   Final    NO GROWTH 4 DAYS Performed at [facility], [place] [address] [place], [place] [address]    Report Status PENDING  Incomplete  Resp Panel by RT-PCR (Flu A&B, Covid) Nasopharyngeal Swab     Status: None   Collection Time: 03/04/21 11:08 PM   Specimen: Nasopharyngeal Swab; Nasopharyngeal(NP) swabs in vial transport medium  Result Value Ref Range Status   SARS Coronavirus 2 by RT PCR NEGATIVE NEGATIVE Final    Comment: (NOTE) SARS-CoV-2 target nucleic acids are NOT DETECTED.  The SARS-CoV-2 RNA is generally detectable in upper respiratory specimens during the acute phase of infection. The lowest concentration of SARS-CoV-2 viral copies this assay can detect is 138 copies/mL. A negative result does not preclude SARS-Cov-2 infection and should not be used as the sole basis for treatment or other patient management decisions. A negative result may occur with  improper specimen  collection/handling, submission of specimen other than nasopharyngeal swab, presence of viral mutation(s) within the areas targeted by this  assay, and inadequate number of viral copies(<138 copies/mL). A negative result must be combined with clinical observations, patient history, and epidemiological information. The expected result is Negative.  Fact Sheet for Patients:  EntrepreneurPulse.com.au  Fact Sheet for Healthcare Providers:  IncredibleEmployment.be  This test is no t yet approved or cleared by the [place] FDA and  has been authorized for detection and/or diagnosis of SARS-CoV-2 by FDA under an Emergency Use Authorization (EUA). This EUA will remain  in effect (meaning this test can be used) for the duration of the COVID-19 declaration under Section 564(b)(1) of the Act, 21 U.S.C.section 360bbb-3(b)(1), unless the authorization is terminated  or revoked sooner.       Influenza A by PCR NEGATIVE NEGATIVE Final   Influenza B by PCR NEGATIVE NEGATIVE Final    Comment: (NOTE) The Xpert Xpress SARS-CoV-2/FLU/RSV plus assay is intended as an aid in the diagnosis of influenza from Nasopharyngeal swab specimens and should not be used as a sole basis for treatment. Nasal washings and aspirates are unacceptable for Xpert Xpress SARS-CoV-2/FLU/RSV testing.  Fact Sheet for Patients: EntrepreneurPulse.com.au  Fact Sheet for Healthcare Providers: IncredibleEmployment.be  This test is not yet approved or cleared by the [place] FDA and has been authorized for detection and/or diagnosis of SARS-CoV-2 by FDA under an Emergency Use Authorization (EUA). This EUA will remain in effect (meaning this test can be used) for the duration of the COVID-19 declaration under Section 564(b)(1) of the Act, 21 U.S.C. section 360bbb-3(b)(1), unless the authorization is terminated or revoked.  Performed at [facility], [place] [address], [place], [place] [address]   Urine Culture     Status: Abnormal   Collection Time: 03/05/21  8:06 PM   Specimen: Urine, Clean Catch  Result Value Ref Range Status   Specimen Description URINE, CLEAN CATCH  Final   Special Requests   Final    NONE Performed at [facility], [place] [address], [place],  [address]    Culture MULTIPLE SPECIES PRESENT, SUGGEST RECOLLECTION (A)  Final   Report Status 03/08/2021 FINAL  Final          Radiology Studies: No results found.      Scheduled Meds:  apixaban  2.5 mg Oral BID   bisoprolol  10 mg Oral Daily   feeding supplement  237 mL Oral BID BM   feeding supplement (PROSource TF)  45 mL Per Tube BID   folic acid  1 mg Oral Daily   free water  150 mL Per Tube Q4H   insulin aspart  0-5 Units Subcutaneous QHS   insulin aspart  0-9 Units Subcutaneous TID WC   mirtazapine  15 mg Oral QHS   multivitamin with minerals  1 tablet Oral Daily   predniSONE  5 mg Oral Q breakfast   rosuvastatin  20 mg Oral Daily   cyanocobalamin  1,000 mcg Oral Daily   Continuous Infusions:  feeding supplement (OSMOLITE 1.5 CAL) 1,000 mL (03/07/21 1235)    sodium bicarbonate (isotonic) infusion in sterile water 75 mL/hr at 03/09/21 0500   sodium chloride            Aline August, MD [facility] 03/09/2021, 8:05 AM

## 2021-03-09 NOTE — Discharge Instructions (Signed)

## 2021-03-09 NOTE — Progress Notes (Signed)
Nutrition Follow-up  DOCUMENTATION CODES:   Underweight, Severe malnutrition in context of chronic illness  INTERVENTION:   -D/c Prosource TF -D/c free water flushes -D/c Osmolite 1.5  -Continue Ensure Enlive po TID, each supplement provides 350 kcal and 20 grams of protein  -MVI with minerals daily -Magic cup TID with meals, each supplement provides 290 kcal and 9 grams of protein  -Initiate 48 hour calorie count  NUTRITION DIAGNOSIS:   Severe Malnutrition related to chronic illness (renal cell carcinoma) as evidenced by severe fat depletion, severe muscle depletion, percent weight loss.  Ongoing  GOAL:   Patient will meet greater than or equal to 90% of their needs  Progressing   MONITOR:   PO intake, Supplement acceptance, Weight trends, Skin, TF tolerance, I & O's, Labs  REASON FOR ASSESSMENT:   Consult, Malnutrition Screening Tool Diet education  ASSESSMENT:   Larry Garner is a 65 y.o. 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 fib on Eliquis, chronic anxiety, presented to [facility] ED from home due to dizziness, and poor oral intake.  History is mainly obtained from EDP, review of medical records and from his wife at bedside who reports that he has lost significant amount of weight due to poor appetite and poor oral intake.  Tonight she offered him a shake.  Reported episode of dizziness and sudden onset tremors, resolved in the ED.  EMS was activated.  CBG registered up as high.  Was brought into the ED for further evaluation.  While in the ED he is found to be hyperglycemic with metabolic acidosis.  Later after being admitted his beta hydroxybutyrate acid came back negative.   Initial venous blood gas pH of 7.2 and serum bicarb of 15.  Started on insulin drip and IV fluid hydration in the ED.  Admitted to hospitalist  service.  7/30- cortrak tube removed, NGT replaced 7/31- NGT removed  Reviewed I/O's: +2.2 L x 24 hours and +9.6 L since admission  UOP: 850 ml x 24 hours  Case discussed with pt wife and RN. Pt's mentation and oral intake have improved. Noted meal completion 50%. Per RN, pt consumed about half of his breakfast and has also been giving him snacks during rounds. Noted pt consumed about 25% of a cup of tomato soup, 100% of a cup of ice cream, and about 1/3 of and Ensure shake. Wife is hesitant to replace the cortrak tube again ("I really just need to get him to eat").   Spoke with pt, who was sitting in recliner chair at time of visit. Pt much more communicative with this RD in comparison to previous visit. Pt reports that he is eating more and feeling better. He was not pleased with NGT placement and hopes to not have to replace the tube. RD provided encouragement and rationale for good nutritional intake. He really enjoys the vanilla Ensure supplements.   Case discussed with Dr. Starla Link, who has been having ongoing discussions with pt wife about potential PEG tube placement.   Medications reviewed and include remeron, prednisone, sodium bicarbonate @ 75 ml/hr, and vitamin B-12.   Labs reviewed: CBGS: 74-228 (inpatient orders for glycemic control are 0-5 units insulin aspart daily at bedtime and 0-9 units insulin aspart TID with meals).    Diet Order:   Diet Order  Diet heart healthy/carb modified Room service appropriate? Yes; Fluid consistency: Thin  Diet effective now                   EDUCATION NEEDS:   Education needs have been addressed  Skin:  Skin Assessment: Skin Integrity Issues: Skin Integrity Issues:: Stage I Stage I: coccyx  Last BM:  03/08/20  Height:   Ht Readings from Last 1 Encounters:  03/05/21 '5\' 11"'$  (1.803 m)    Weight:   Wt Readings from Last 1 Encounters:  03/09/21 65.6 kg    Ideal Body Weight:  78.2 kg  BMI:  Body mass index is  20.17 kg/m.  Estimated Nutritional Needs:   Kcal:  2200-2400  Protein:  120-135 grams  Fluid:  > 2 L    Loistine Chance, RD, LDN, [place] Registered Dietitian II Certified Diabetes Care and Education Specialist Please refer to [facility] for RD and/or RD on-call/weekend/after hours pager

## 2021-03-09 NOTE — Progress Notes (Signed)
Physical Therapy Treatment Patient Details Name: Larry Garner MRN: [id number] DOB: 02-11-1956 Today's Date: 03/09/2021    History of Present Illness Pt is a 65 y/o male admitted 7/27 secondary to HHS in the setting of diet noncompliance and dehydration from poor oral intake and DKA. PMH includes CVA with R sided and cognitive deficits, CHF, DM, HTN, and renal cancer.    PT Comments    Today's skilled session continued to focus on mobility progression. Pt more alert, oriented and cooperative with therapy with increased activity performed. Acute PT to continue during pt's hospital stay.     Follow Up Recommendations  Home health PT;Supervision/Assistance - 24 hour     Equipment Recommendations  Hospital bed    Precautions / Restrictions Precautions Precautions: Fall Restrictions Weight Bearing Restrictions: No    Mobility  Bed Mobility               General bed mobility comments: not observed- pt in chair before and after session    Transfers Overall transfer level: Needs assistance Equipment used: Rolling walker (2 wheeled) Transfers: Sit to/from Stand Sit to Stand: Min guard         General transfer comment: sit<>stand x2 reps chair<>RW with cues for hand placement, no physical assistance needed  Ambulation/Gait Ambulation/Gait assistance: Min guard Gait Distance (Feet): 30 Feet (x2 reps) Assistive device: Rolling walker (2 wheeled) Gait Pattern/deviations: Step-through pattern;Decreased stride length;Narrow base of support;Trunk flexed Gait velocity: improved today, not formally measured   General Gait Details: no balance loss noted with gait or turns in room with RW. cues for posture and to stay closer to RW with gait        Cognition Arousal/Alertness: Awake/alert Behavior During Therapy: WFL for tasks assessed/performed Overall Cognitive Status: Within Functional Limits for tasks assessed                       Memory: Decreased  short-term memory         General Comments: pt with fluctuating cognition since CVA. Today pt was appropriately conversational, followed all directions with no delay and paticipatory with therapy. Spouse present and reporting "it's a good day tdoay" as pt was more awake and oriented.      Exercises General Exercises - Lower Extremity Ankle Circles/Pumps: AROM;Strengthening;Both;10 reps;Seated;Limitations Ankle Circles/Pumps Limitations: with foot rest up and manual resistance for PF Long Arc Quad: AROM;Strengthening;Both;10 reps;Seated Toe Raises: AROM;Strengthening;Both;10 reps;Seated Heel Raises: AROM;Strengthening;Both;10 reps;Seated     Pertinent Vitals/Pain Pain Assessment: 0-10     PT Goals (current goals can now be found in the care plan section) Acute Rehab PT Goals Patient Stated Goal: to go home PT Goal Formulation: With patient/family Time For Goal Achievement: 03/19/21 Potential to Achieve Goals: Good Progress towards PT goals: Progressing toward goals    Frequency    Min 3X/week      PT Plan Current plan remains appropriate    AM-PAC PT "6 Clicks" Mobility   Outcome Measure  Help needed turning from your back to your side while in a flat bed without using bedrails?: A Little Help needed moving from lying on your back to sitting on the side of a flat bed without using bedrails?: A Little Help needed moving to and from a bed to a chair (including a wheelchair)?: A Little Help needed standing up from a chair using your arms (e.g., wheelchair or bedside chair)?: None Help needed to walk in hospital room?: A Little Help needed climbing 3-5  steps with a railing? : A Lot 6 Click Score: 18          03/09/21 1249  Progressive Mobility  What is the highest level of mobility based on the progressive mobility assessment? Level 3 (Stands with assist) - Balance while standing  and cannot march in place  Mobility Ambulated with assistance in room;Out of bed  to chair with meals;Sit up in bed/chair position for meals          End of Session Equipment Utilized During Treatment: Gait belt Activity Tolerance: Patient tolerated treatment well Patient left: in chair;with call bell/phone within reach;with chair alarm set;with family/visitor present (spouse in room) Nurse Communication: Mobility status PT Visit Diagnosis: Unsteadiness on feet (R26.81);Muscle weakness (generalized) (M62.81)     Time: [id number] PT Time Calculation (min) (ACUTE ONLY): 25 min  Charges:  $Gait Training: 8-22 mins                     Willow Ora, PTA, [facility] [employer] [phone] 03/09/21, 12:57 PM   Willow Ora 03/09/2021, 12:53 PM

## 2021-03-09 NOTE — Significant Event (Signed)
Pt is up to the [facility] from the Bed. Planning to eat a snack after.

## 2021-03-09 NOTE — Progress Notes (Signed)
Inpatient Diabetes Program Recommendations  AACE/ADA: New Consensus Statement on Inpatient Glycemic Control   Target Ranges:  Prepandial:   less than 140 mg/dL      Peak postprandial:   less than 180 mg/dL (1-2 hours)      Critically ill patients:  140 - 180 mg/dL   Results for Larry Garner, Larry Garner (MRN [id number]) as of 03/09/2021 09:42  Ref. Range 03/09/2021 00:08 03/09/2021 04:33 03/09/2021 07:39  Glucose-Capillary Latest Ref Range: 70 - 99 mg/dL 105 (H) 74 94  Results for Larry Garner, Larry Garner (MRN [id number]) as of 03/09/2021 09:42  Ref. Range 03/08/2021 07:16 03/08/2021 12:25 03/08/2021 15:49 03/08/2021 20:30  Glucose-Capillary Latest Ref Range: 70 - 99 mg/dL 155 (H) 312 (H) 291 (H) 197 (H)    Review of Glycemic Control   Current orders for Inpatient glycemic control: Novolog 0-9 units TID, Novolog 0-5 units QHS; Prednisone 5 mg daily  Inpatient Diabetes Program Recommendations:    Insulin: If steroids are continued, please consider ordering Novolog 3 units TID with meals for meal coverage if patient eats at least 50% of meals.  Thanks, Barnie Alderman, RN, MSN, CDE Diabetes Coordinator Inpatient Diabetes Program [phone] (Team Pager from 8am to 5pm)

## 2021-03-10 ENCOUNTER — Ambulatory Visit: Payer: BC Managed Care – PPO | Admitting: Family Medicine

## 2021-03-10 ENCOUNTER — Inpatient Hospital Stay (HOSPITAL_COMMUNITY): Payer: Medicare Other

## 2021-03-10 DIAGNOSIS — E860 Dehydration: Secondary | ICD-10-CM | POA: Diagnosis not present

## 2021-03-10 DIAGNOSIS — R627 Adult failure to thrive: Secondary | ICD-10-CM | POA: Diagnosis not present

## 2021-03-10 DIAGNOSIS — E1100 Type 2 diabetes mellitus with hyperosmolarity without nonketotic hyperglycemic-hyperosmolar coma (NKHHC): Secondary | ICD-10-CM | POA: Diagnosis not present

## 2021-03-10 DIAGNOSIS — N179 Acute kidney failure, unspecified: Secondary | ICD-10-CM | POA: Diagnosis not present

## 2021-03-10 LAB — MAGNESIUM: Magnesium: 1.9 mg/dL (ref 1.7–2.4)

## 2021-03-10 LAB — BASIC METABOLIC PANEL
Anion gap: 13 (ref 5–15)
BUN: 57 mg/dL — ABNORMAL HIGH (ref 8–23)
CO2: 21 mmol/L — ABNORMAL LOW (ref 22–32)
Calcium: 9.1 mg/dL (ref 8.9–10.3)
Chloride: 101 mmol/L (ref 98–111)
Creatinine, Ser: 1.87 mg/dL — ABNORMAL HIGH (ref 0.61–1.24)
GFR, Estimated: 39 mL/min — ABNORMAL LOW (ref >60)
Glucose, Bld: 118 mg/dL — ABNORMAL HIGH (ref 70–99)
Potassium: 4.4 mmol/L (ref 3.5–5.1)
Sodium: 135 mmol/L (ref 135–145)

## 2021-03-10 LAB — CBC WITH DIFFERENTIAL/PLATELET
Abs Immature Granulocytes: 0.13 10*3/uL — ABNORMAL HIGH (ref 0.00–0.07)
Basophils Absolute: 0 10*3/uL (ref 0.0–0.1)
Basophils Relative: 0 %
Eosinophils Absolute: 0.1 10*3/uL (ref 0.0–0.5)
Eosinophils Relative: 1 %
HCT: 30.1 % — ABNORMAL LOW (ref 39.0–52.0)
Hemoglobin: 9.3 g/dL — ABNORMAL LOW (ref 13.0–17.0)
Immature Granulocytes: 1 %
Lymphocytes Relative: 9 %
Lymphs Abs: 1.5 10*3/uL (ref 0.7–4.0)
MCH: 26.6 pg (ref 26.0–34.0)
MCHC: 30.9 g/dL (ref 30.0–36.0)
MCV: 86 fL (ref 80.0–100.0)
Monocytes Absolute: 1.3 10*3/uL — ABNORMAL HIGH (ref 0.1–1.0)
Monocytes Relative: 8 %
Neutro Abs: 13.6 10*3/uL — ABNORMAL HIGH (ref 1.7–7.7)
Neutrophils Relative %: 81 %
Platelets: 381 10*3/uL (ref 150–400)
RBC: 3.5 MIL/uL — ABNORMAL LOW (ref 4.22–5.81)
RDW: 17.4 % — ABNORMAL HIGH (ref 11.5–15.5)
WBC: 16.6 10*3/uL — ABNORMAL HIGH (ref 4.0–10.5)
nRBC: 0 % (ref 0.0–0.2)

## 2021-03-10 LAB — GLUCOSE, CAPILLARY
Glucose-Capillary: 121 mg/dL — ABNORMAL HIGH (ref 70–99)
Glucose-Capillary: 169 mg/dL — ABNORMAL HIGH (ref 70–99)
Glucose-Capillary: 190 mg/dL — ABNORMAL HIGH (ref 70–99)
Glucose-Capillary: 267 mg/dL — ABNORMAL HIGH (ref 70–99)
Glucose-Capillary: 379 mg/dL — ABNORMAL HIGH (ref 70–99)

## 2021-03-10 MED ORDER — FUROSEMIDE 10 MG/ML IJ SOLN
40.0000 mg | Freq: Once | INTRAMUSCULAR | Status: AC
  Administered 2021-03-10: 40 mg via INTRAVENOUS
  Filled 2021-03-10: qty 4

## 2021-03-10 MED ORDER — CEFAZOLIN SODIUM-DEXTROSE 2-4 GM/100ML-% IV SOLN: 2.0000 g | Freq: Once | INTRAVENOUS | Status: DC

## 2021-03-10 NOTE — Progress Notes (Signed)
Patient ID: Larry Garner, male   DOB: April 24, 1956, 65 y.o.   MRN: [id number]  PROGRESS NOTE    OGDEN MALINA  [id number] DOB: 06/26/1956 DOA: 03/04/2021 PCP: Marin Olp, MD   Brief Narrative:  65 y.o. 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 fib on Eliquis, chronic anxiety presented with dizziness and poor oral intake along with significant weight loss.  On presentation, patient was hyperglycemic with serum glucose of 664, bicarb of 15, creatinine of 2, potassium of 5.6, WBCs of 14.2.  He was started on IV fluids and insulin drip.  Subsequently, he was transitioned to long-acting insulin which was subsequently discontinued because of hypoglycemia.  Hospital course complicated by ongoing poor oral intake and failure to thrive for which cortrak was placed which got dislodged and subsequently NG tube was placed which was also dislodged.  Assessment & Plan:   Hyperglycemic hyperosmolar state Dehydration Extremely poor oral intake Failure to thrive/weight loss/severe protein calorie malnutrition Hypoglycemia -Patient has had progressively worsening weight loss with poor oral intake/appetite and hence was given milkshake by wife yesterday.  Subsequently patient was found to be hyperglycemic with serum glucose of 664 and bicarb of 15 with AKI. -He was initially started on insulin drip and IV fluids.  Subsequently he was transitioned to long-acting insulin.  Lantus was discontinued on 03/06/2021 because of hypoglycemia.  Blood sugars still fluctuating.  Will continue sliding scale insulin coverage. -Oral intake is still poor.  Patient used to be on desvenlafaxine till recently to improve his appetite but this has recently been discontinued because of  increasing sleepiness. -Increased mirtazapine to 15 mg q. nightly. -IV fluids as below -Cortrak was placed on 03/06/2021 as per dietitian recommendations but unfortunately got dislodged overnight.  Subsequently NG tube was placed on 03/07/2021 and NG feeding was started.  NG tube got dislodged on 03/08/2021 and subsequently patient did not want it reinserted.   -Discussed with the wife at bedside on 03/09/2021 regarding PEG tube feeding and she will discuss with her family and decide accordingly.  She was okay for IR consult in the meantime.  Acute kidney injury None anion gap metabolic acidosis/lactic acidosis -Bicarb 21 today.  Creatinine 1.87 today.  IV fluids plan as below. -Monitor creatinine  Chronic systolic and diastolic heart failure -Currently compensated.  Last echo on 12/10/2020 had shown EF of 35 to 40% with grade 2 diastolic dysfunction.  Patient is on Entresto, spironolactone and Lasix at home.  These have been on hold since presentation because of acute kidney injury.  JVD is mildly elevated today.  Will DC IV fluids.  Give 1 dose of IV Lasix 40 mg.  Hyperkalemia -Resolved  Leukocytosis -Possibly reactive.  No signs of infection.  Chest x-ray negative for infiltrates.  COVID and influenza testing were negative.  Empirically started on Rocephin on admission which was subsequently discontinued on 03/05/2021. -Still has significant leukocytosis.  Monitor.  Left renal cell carcinoma -Patient was supposed to have surgery in December 2021 but subsequently had to be postponed because of acute stroke.  Outpatient follow-up with urology/oncology at [facility]. -He is currently not a good candidate for undergoing surgery because of weight loss/poor oral intake  Doubt that patient has sepsis -Plan as above; cultures negative so far  Anemia of chronic disease -Hemoglobin stable.  Monitor.  No signs of bleeding  Hyperlipidemia -Continue statin  Chronic A. Fib -Rate controlled.  Continue  bisoprolol.  Eliquis held on 03/09/2021 in case family agrees for PEG tube placement.  History of CAD status post CABG -Currently stable.  Outpatient follow-up with cardiology  Generalized deconditioning -PT recommends home health PT.  Stage I medial coccyx pressure injury: Present on admission -Continue local wound care   DVT prophylaxis: Eliquis held on 03/09/2021. Code Status: Full Family Communication: Wife at bedside on 03/09/2021  disposition Plan: Status is: Inpatient  Remains inpatient appropriate because:Inpatient level of care appropriate due to severity of illness.   Dispo:  Patient From: Home  Planned Disposition: Home   Medically stable for discharge: No   Consultants: IR  Procedures: None  Antimicrobials:  Anti-infectives (From admission, onward)    Start     Dose/Rate Route Frequency Ordered Stop   03/05/21 1000  valACYclovir (VALTREX) tablet 1,000 mg  Status:  Discontinued        1,000 mg Oral Daily 03/04/21 2305 03/05/21 0804   03/05/21 0800  cefTRIAXone (ROCEPHIN) 1 g in sodium chloride 0.9 % 100 mL IVPB  Status:  Discontinued        1 g 200 mL/hr over 30 Minutes Intravenous Every 24 hours 03/05/21 0612 03/05/21 1311         Subjective: Patient seen and examined at bedside.  Poor historian.  No fever, vomiting, worsening shortness of breath reported.  Oral intake is still poor as per nursing staff.   Objective: Vitals:   03/09/21 0433 03/09/21 1105 03/09/21 2003 03/10/21 0327  BP: 116/67 108/89 110/71   Pulse: 75 71 71   Resp: '18 20 17   '$ Temp: (!) 97.5 F (36.4 C) 97.9 F (36.6 C) 98.6 F (37 C)   TempSrc: Oral Oral Oral   SpO2: 100% 99% 100%   Weight: 65.6 kg   65.4 kg  Height:        Intake/Output Summary (Last 24 hours) at 03/10/2021 0749 Last data filed at 03/09/2021 2022 Gross per 24 hour  Intake 1709.92 ml  Output 675 ml  Net 1034.92 ml    Filed Weights   03/08/21 0420 03/09/21 0433 03/10/21 0327  Weight: 65.5 kg 65.6 kg 65.4 kg     Examination:  General exam: No distress.  Still on room air.  Looks older than stated age and chronically ill.  Extremely thinly built.  Extremely poor historian Neck: JVD slightly elevated. Respiratory system: Decreased breath sounds at bases bilaterally with no wheezing  cardiovascular system: Rate controlled, S1-2 heard gastrointestinal system: Abdomen is mildly distended, soft and nontender.  Bowel sounds are heard  extremities: No cyanosis, clubbing or edema  Central nervous system: Wakes up slightly, still extremely slow to respond to questions.  No obvious focal deficits.  Right-sided hemiparesis present Skin: No obvious ecchymosis/lesions psychiatry: Flat affect  Data Reviewed: I have personally reviewed following labs and imaging studies  CBC: Recent Labs  Lab 03/04/21 2052 03/04/21 2111 03/05/21 1041 03/06/21 0258 03/07/21 0623 03/09/21 0215 03/10/21 0339  WBC 14.2*  --  12.4* 15.8* 14.8* 15.9* 16.6*  NEUTROABS 12.6*  --   --   --  12.3* 13.1* 13.6*  HGB 9.6*   < > 8.6* 9.4* 8.4* 8.5* 9.3*  HCT 32.6*   < > 26.9* 31.2* 26.9* 27.5* 30.1*  MCV 89.8  --  85.1 86.4 85.1 86.2 86.0  PLT 409*  --  342 444* 333 321 381   < > =  values in this interval not displayed.    Basic Metabolic Panel: Recent Labs  Lab 03/05/21 0642 03/05/21 1041 03/06/21 0258 03/07/21 0623 03/08/21 0304 03/09/21 0215 03/10/21 0339  NA 137   < > 137 137 132* 135 135  K 4.5   < > 4.7 4.7 5.1 4.9 4.4  CL 112*   < > 107 110 107 106 101  CO2 19*   < > 20* 17* 15* 17* 21*  GLUCOSE 90   < > 66* 100* 160* 74 118*  BUN 87*   < > 75* 62* 62* 59* 57*  CREATININE 1.78*   < > 1.77* 1.69* 1.75* 1.77* 1.87*  CALCIUM 9.4   < > 9.3 9.0 8.8* 8.9 9.1  MG 1.8  --   --  1.7 1.8 1.8 1.9  PHOS 1.8*  --  3.0 3.5  --   --   --    < > = values in this interval not displayed.    GFR: Estimated Creatinine Clearance: 36.4 mL/min (A) (by C-G formula based on SCr of 1.87 mg/dL (H)). Liver Function  Tests: Recent Labs  Lab 03/04/21 2052 03/06/21 0258 03/09/21 0215  AST 17 16 47*  ALT 23 19 33  ALKPHOS 202* 174* 280*  BILITOT 0.8 1.0 0.8  PROT 7.0 6.6 6.3*  ALBUMIN 2.0* 1.7* 1.8*    No results for input(s): LIPASE, AMYLASE in the last 168 hours. Recent Labs  Lab 03/07/21 0623  AMMONIA 29    Coagulation Profile: No results for input(s): INR, PROTIME in the last 168 hours. Cardiac Enzymes: Recent Labs  Lab 03/04/21 2052  CKTOTAL <5*    BNP (last 3 results) No results for input(s): PROBNP in the last 8760 hours. HbA1C: No results for input(s): HGBA1C in the last 72 hours. CBG: Recent Labs  Lab 03/09/21 1108 03/09/21 1615 03/09/21 2006 03/09/21 2353 03/10/21 0345  GLUCAP 228* 246* 203* 127* 121*    Lipid Profile: No results for input(s): CHOL, HDL, LDLCALC, TRIG, CHOLHDL, LDLDIRECT in the last 72 hours. Thyroid Function Tests: No results for input(s): TSH, T4TOTAL, FREET4, T3FREE, THYROIDAB in the last 72 hours.  Anemia Panel: No results for input(s): VITAMINB12, FOLATE, FERRITIN, TIBC, IRON, RETICCTPCT in the last 72 hours.  Sepsis Labs: Recent Labs  Lab 03/04/21 2122 03/05/21 0131 03/05/21 0642 03/05/21 1041  PROCALCITON  --   --  0.49  --   LATICACIDVEN 2.5* 2.3* 1.3 1.3     Recent Results (from the past 240 hour(s))  Blood culture (routine x 2)     Status: None   Collection Time: 03/04/21  9:00 PM   Specimen: BLOOD  Result Value Ref Range Status   Specimen Description BLOOD LEFT ANTECUBITAL  Final   Special Requests   Final    BOTTLES DRAWN AEROBIC AND ANAEROBIC Blood Culture adequate volume   Culture   Final    NO GROWTH 5 DAYS Performed at [facility], [address], [place], [place] [address]    Report Status 03/09/2021 FINAL  Final  Blood culture (routine x 2)     Status: None   Collection Time: 03/04/21  9:01 PM   Specimen: BLOOD RIGHT FOREARM  Result Value Ref Range Status   Specimen Description BLOOD RIGHT FOREARM   Final   Special Requests   Final    BOTTLES DRAWN AEROBIC AND ANAEROBIC Blood Culture adequate volume   Culture   Final    NO GROWTH 5 DAYS Performed at [facility], [place] [address] [place], [place] [address]    Report Status 03/09/2021 FINAL  Final  Resp Panel by RT-PCR (Flu A&B, Covid) Nasopharyngeal Swab     Status: None   Collection Time: 03/04/21 11:08 PM   Specimen: Nasopharyngeal Swab; Nasopharyngeal(NP) swabs in vial transport medium  Result Value Ref Range Status   SARS Coronavirus 2 by RT PCR NEGATIVE NEGATIVE Final    Comment: (NOTE) SARS-CoV-2 target nucleic acids are NOT DETECTED.  The SARS-CoV-2 RNA is generally detectable in upper respiratory specimens during the acute phase of infection. The lowest concentration of SARS-CoV-2 viral copies this assay can detect is 138 copies/mL. A negative result does not preclude SARS-Cov-2 infection and should not be used as the sole basis for treatment or other patient management decisions. A negative result may occur with  improper specimen collection/handling, submission of specimen other than nasopharyngeal swab, presence of viral mutation(s) within the areas targeted by this assay, and inadequate number of viral copies(<138 copies/mL). A negative result must be combined with clinical observations, patient history, and epidemiological information. The expected result is Negative.  Fact Sheet for Patients:  EntrepreneurPulse.com.au  Fact Sheet for Healthcare Providers:  IncredibleEmployment.be  This test is no t yet approved or cleared by the [place] FDA and  has been authorized for detection and/or diagnosis of SARS-CoV-2 by FDA under an Emergency Use Authorization (EUA). This EUA will remain  in effect (meaning this test can be used) for the duration of the COVID-19 declaration under Section 564(b)(1) of the Act, 21 U.S.C.section 360bbb-3(b)(1), unless the authorization is  terminated  or revoked sooner.       Influenza A by PCR NEGATIVE NEGATIVE Final   Influenza B by PCR NEGATIVE NEGATIVE Final    Comment: (NOTE) The Xpert Xpress SARS-CoV-2/FLU/RSV plus assay is intended as an aid in the diagnosis of influenza from Nasopharyngeal swab specimens and should not be used as a sole basis for treatment. Nasal washings and aspirates are unacceptable for Xpert Xpress SARS-CoV-2/FLU/RSV testing.  Fact Sheet for Patients: EntrepreneurPulse.com.au  Fact Sheet for Healthcare Providers: IncredibleEmployment.be  This test is not yet approved or cleared by the [place] FDA and has been authorized for detection and/or diagnosis of SARS-CoV-2 by FDA under an Emergency Use Authorization (EUA). This EUA will remain in effect (meaning this test can be used) for the duration of the COVID-19 declaration under Section 564(b)(1) of the Act, 21 U.S.C. section 360bbb-3(b)(1), unless the authorization is terminated or revoked.  Performed at [facility], [place] [address] [place], [place] [address]   Urine Culture     Status: Abnormal   Collection Time: 03/05/21  8:06 PM   Specimen: Urine, Clean Catch  Result Value Ref Range Status   Specimen Description URINE, CLEAN CATCH  Final   Special Requests   Final    NONE Performed at [facility], [place] [address] [place],  [address]    Culture MULTIPLE SPECIES PRESENT, SUGGEST RECOLLECTION (A)  Final   Report Status 03/08/2021 FINAL  Final          Radiology Studies: No results found.      Scheduled Meds:  bisoprolol  10 mg Oral Daily   feeding supplement  237 mL Oral TID BM   folic acid  1 mg Oral Daily   insulin aspart  0-5 Units Subcutaneous QHS   insulin aspart  0-9 Units Subcutaneous TID WC   mirtazapine  15 mg Oral QHS  multivitamin with minerals  1 tablet Oral Daily   predniSONE  5 mg Oral Q breakfast   rosuvastatin  20 mg Oral Daily    cyanocobalamin  1,000 mcg Oral Daily   Continuous Infusions:   sodium bicarbonate (isotonic) infusion in sterile water 75 mL/hr at 03/09/21 0500   sodium chloride            Aline August, MD [facility] 03/10/2021, 7:49 AM

## 2021-03-10 NOTE — Progress Notes (Signed)
Inpatient Diabetes Program Recommendations  AACE/ADA: New Consensus Statement on Inpatient Glycemic Control Target Ranges:  Prepandial:   less than 140 mg/dL      Peak postprandial:   less than 180 mg/dL (1-2 hours)      Critically ill patients:  140 - 180 mg/dL   Results for JACORIEN, SIPLE (MRN [id number]) as of 03/10/2021 07:43  Ref. Range 03/09/2021 07:39 03/09/2021 11:08 03/09/2021 16:15 03/09/2021 20:06 03/09/2021 23:53 03/10/2021 03:45  Glucose-Capillary Latest Ref Range: 70 - 99 mg/dL 94 228 (H) 246 (H) 203 (H) 127 (H) 121 (H)    Review of Glycemic Control  Current orders for Inpatient glycemic control: Novolog 0-9 units TID, Novolog 0-5 units QHS; Prednisone 5 mg daily   Inpatient Diabetes Program Recommendations:     Insulin: If steroids are continued, please consider ordering Novolog 2 units TID with meals for meal coverage if patient eats at least 50% of meals.  Thanks, Barnie Alderman, RN, MSN, CDE Diabetes Coordinator Inpatient Diabetes Program [phone] (Team Pager from 8am to 5pm)

## 2021-03-10 NOTE — Consult Note (Signed)
Chief Complaint: Failure to thrive. Request is for gastrostomy tube placement.   Referring Physician(s): Dr. Cyndi Bender   Supervising Physician: Michaelle Birks  Patient Status: [facility] - In-pt  History of Present Illness: Larry Garner is a 65 y.o. male  History of left renal cell carcinoma, CAD s/p CABG, CHF, a fib on eliquis. 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 Presented to the ED at [facility] on 7.27.22 with AMS. Found to be hyperglycemic with hyperkalemia and leukocytosis.  Hospital course complicated by AKI and  poor oral intact.Team is requesting a gastrostomy tube for ongoing nutritional access.  Currently without any significant complaints. Patient alert and laying in bed, calm and comfortable. Denies any fevers, headache, chest pain, SOB, cough, abdominal pain, nausea, vomiting or bleeding.Patient will nod his head appropriately and states that he knows about the procedure. When asked if he has any questions about the procedure the Patient states that " I think it is best for Mickel Baas"  Return precautions and treatment recommendations and follow-up discussed with the patient and the patient's wife (via the telephone) who are agreeable with the plan.   Past Medical History:  Diagnosis Date   Allergic rhinitis    Allergy    Anxiety    Asthma    CHF (congestive heart failure) ([place])    Diabetes ([place])    Diverticulitis    Dyspnea    GERD (gastroesophageal reflux disease)    very occ   History of heart attack    HLD (hyperlipidemia)    HTN (hypertension)    Myocardial infarction ([place]) 2000   Sleep apnea    wears cpap    Tubular adenoma of colon 09/2008    Past Surgical History:  Procedure Laterality Date   CARDIAC CATHETERIZATION N/A 07/20/2016   Procedure: Left Heart Cath and Cors/Grafts Angiography;  Surgeon: Belva Crome, MD;  Location: [facility];  Service: Cardiovascular;  Laterality: N/A;   CATARACT EXTRACTION Bilateral 06/06/2019    Dr. Tommy Rainwater. oct left, dec right 2020    COLONOSCOPY     CORONARY ANGIOPLASTY     CORONARY ARTERY BYPASS GRAFT  06/1999   POLYPECTOMY     RENAL BIOPSY  06/2020   TONSILLECTOMY     late50's early 60's    Allergies: Morphine sulfate  Medications: Prior to Admission medications   Medication Sig Start Date End Date Taking? Authorizing Provider  albuterol (VENTOLIN HFA) 108 (90 Base) MCG/ACT inhaler Inhale 2 puffs into the lungs every 6 (six) hours as needed for wheezing or shortness of breath. 06/17/20  Yes Marin Olp, MD  apixaban (ELIQUIS) 5 MG TABS tablet Take 1 tablet (5 mg total) by mouth in the morning and at bedtime. 08/12/20  Yes Weaver, Scott T, PA-C  Azelastine-Fluticasone 137-50 MCG/ACT SUSP Place 1 spray into the nose daily as needed (allergies).   Yes [provider]  bisoprolol (ZEBETA) 10 MG tablet TAKE 1 TABLET BY MOUTH EVERY DAY Patient taking differently: Take 10 mg by mouth daily. 10/06/20  Yes Marin Olp, MD  cyanocobalamin 1000 MCG tablet Take 1,000 mcg by mouth daily.   Yes [provider]  empagliflozin (JARDIANCE) 10 MG TABS tablet Take 1 tablet (10 mg total) by mouth daily before breakfast. 08/22/20  Yes Marin Olp, MD  famciclovir (FAMVIR) 500 MG tablet TAKE 3 TABLETS BY MOUTH EVERY DAY AS NEEDED AT FIRST SIGN OF OUTBREAK OF FEVER BLISTER Patient taking differently: Take  500 mg by mouth daily as needed (fever blister). 04/25/19  Yes Marin Olp, MD  furosemide (LASIX) 40 MG tablet Take 1 tablet (40 mg total) by mouth daily. 10/08/20  Yes Josue Hector, MD  glimepiride (AMARYL) 2 MG tablet Take 2 mg by mouth daily as needed (only if blood sugar over 110).   Yes [provider]  metFORMIN (GLUCOPHAGE) 1000 MG tablet Take 1,000 mg by mouth 2 (two) times daily. 12/12/20  Yes [provider]  Multiple Vitamin (MULTIVITAMIN) tablet Take 1 tablet by mouth daily.   Yes [provider]  nitroGLYCERIN  (NITROSTAT) 0.4 MG SL tablet PLACE 1 TABLET UNDER THE TONGUE EVERY 5 MINUTES AS NEEDED FOR CHEST PAIN. Patient taking differently: Place 0.4 mg under the tongue every 5 (five) minutes as needed. 07/20/19  Yes Marin Olp, MD  predniSONE (DELTASONE) 5 MG tablet Take 5 mg by mouth daily. 12/24/20  Yes [provider]  rosuvastatin (CRESTOR) 20 MG tablet TAKE 1 TABLET BY MOUTH DAILY Patient taking differently: Take 20 mg by mouth daily. 02/13/20  Yes Marin Olp, MD  sacubitril-valsartan (ENTRESTO) 49-51 MG Take 1 tablet by mouth 2 (two) times daily. 10/08/20  Yes Josue Hector, MD  spironolactone (ALDACTONE) 25 MG tablet Take 0.5 tablets (12.5 mg total) by mouth daily. Patient taking differently: Take 12.5-25 mg by mouth See admin instructions. Takes 25 mg on Monday and Thursday and 12.5 mg on all other days 08/05/20  Yes Richardson Dopp T, PA-C  triamcinolone cream (KENALOG) 0.1 % Apply 1 application topically 2 (two) times daily as needed (itching). 11/13/20  Yes [provider]  triamcinolone lotion (KENALOG) 0.1 % Apply 1 application topically 2 (two) times daily as needed (itching). 02/16/21  Yes [provider]  ALPRAZolam Duanne Moron) 0.5 MG tablet Take 1 tablet (0.5 mg total) by mouth every 6 (six) hours as needed. Patient not taking: No sig reported 06/23/20   Marin Olp, MD  Blood Glucose Monitoring Suppl (CONTOUR NEXT ONE) KIT Use to test blood sugar daily. Dx: E11.9 12/01/20   Marin Olp, MD  glucose blood (BAYER CONTOUR NEXT TEST) test strip Use to test blood sugars daily. Dx: E11.9 12/01/20   Marin Olp, MD  hydrOXYzine (ATARAX/VISTARIL) 25 MG tablet Take 25 mg by mouth 3 (three) times daily as needed. Patient not taking: No sig reported    [provider]  metFORMIN (GLUCOPHAGE) 500 MG tablet Take 1 tablet (500 mg total) by mouth 2 (two) times daily with a meal. No print Patient not taking: No sig reported 12/12/20   Marin Olp,  MD  oxymetazoline (AFRIN) 0.05 % nasal spray Place 1 spray into both nostrils 2 (two) times daily as needed for congestion. Patient not taking: No sig reported    [provider]  zaleplon (SONATA) 10 MG capsule Take 1 capsule (10 mg total) by mouth at bedtime as needed. for sleep Patient not taking: No sig reported 08/27/20   Marin Olp, MD     Family History  Problem Relation Age of Onset   Hypertension Mother    Alzheimer's disease Mother    Colon polyps Mother    Hyperlipidemia Father    Hypertension Father    Colon polyps Father    Colon cancer Paternal Aunt    Pancreatic cancer Neg Hx    Stomach cancer Neg Hx    Thyroid disease Neg Hx    Esophageal cancer Neg Hx  Rectal cancer Neg Hx     Social History   Socioeconomic History   Marital status: Married    Spouse name: Not on file   Number of children: Not on file   Years of education: Not on file   Highest education level: Not on file  Occupational History   Occupation: Banker  Tobacco Use   Smoking status: Former    Packs/day: 0.50    Years: 4.00    Pack years: 2.00    Types: Cigarettes    Quit date: 09/29/1977    Years since quitting: 43.4   Smokeless tobacco: Never   Tobacco comments:    quit on 1980  Vaping Use   Vaping Use: Never used  Substance and Sexual Activity   Alcohol use: Not Currently   Drug use: No   Sexual activity: Yes  Other Topics Concern   Not on file  Social History Narrative   Married  In 1981 with 2 kids (son and daughter). No grandkids.       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   All other systems are negative.  Review of Systems  Constitutional:  Negative for fever.  HENT:  Negative for congestion.   Respiratory:  Negative for cough and shortness of breath.   Cardiovascular:  Negative for chest pain.  Gastrointestinal:  Negative for abdominal pain.  Neurological:  Negative for headaches.  Psychiatric/Behavioral:  Negative for behavioral problems and confusion.    Vital Signs: BP 109/77 (BP Location: Right Arm)   Pulse 69   Temp 98.1 F (36.7 C) (Oral)   Resp 17   Ht $R'5\' 11"'Uz$  (1.803 m)   Wt 144 lb 2.9 oz (65.4 kg)   SpO2 99%   BMI 20.11 kg/m   Physical Exam Vitals and nursing note reviewed.  Constitutional:      Appearance: He is well-developed. He is ill-appearing.  HENT:     Head: Normocephalic.  Cardiovascular:     Rate and Rhythm: Normal rate and regular rhythm.  Pulmonary:     Effort: Pulmonary effort is normal.     Breath sounds: Normal breath sounds.  Musculoskeletal:     Cervical back: Normal range of motion.     Comments: Right side weaker than left  Skin:    General: Skin is dry.  Neurological:     Mental Status: He is alert.     Comments: Oriented to person and place. Expressive aphasia noted during exam.    Imaging: CT ABDOMEN WO CONTRAST  Result Date: 03/10/2021 CLINICAL DATA:  Dysphagia, weight loss, deconditioned, stroke, AFib, assess for feeding 2 EXAM: CT ABDOMEN WITHOUT CONTRAST TECHNIQUE: Multidetector CT imaging of the abdomen was performed following the standard protocol without IV contrast. COMPARISON:  05/20/2020, 02/21/2020 FINDINGS: Lower chest: Small pleural effusions layering dependently, larger on the right. Minor basilar atelectasis. Heart is enlarged. No pericardial effusion. Native coronary atherosclerosis noted. Previous median sternotomy. Degenerative changes noted of the spine. Hepatobiliary: Limited without IV contrast. Liver appears enlarged. Liver measures 20 cm in length. No large focal hepatic abnormality  or  intrahepatic biliary dilatation. Large peripherally calcified gallstone measuring 2.8 cm. Gallbladder nondistended. Common bile duct nondilated. Upper abdominal ascites about the liver and spleen. Pancreas: No large focal abnormality or ductal dilatation. Spleen: No focal splenic abnormality.  Surrounding ascites noted. Adrenals/Urinary Tract: Normal adrenal glands. Right kidney has known lateral exophytic cysts as before. No renal obstruction or hydronephrosis. Left kidney has a large mixed density mass measuring up to 10.4 cm compatible with a known left renal cell carcinoma. The mixed attenuation may represent a combination of renal mass and surrounding hemorrhage. No hydroureter Stomach/Bowel: Stomach is slightly high collapse. Portion of the stomach is posterior to the left hepatic lobe. Transverse colon is inferior to the stomach. No significant bowel dilatation, ileus pattern, or free air. No focal fluid collection or abscess appreciated. Vascular/Lymphatic: Aorta atherosclerotic. Negative for aneurysm. No retroperitoneal hemorrhage or hematoma. Limited assessment without IV contrast. Difficult to exclude developing left periaortic adenopathy possibly metastatic related to the large left renal mass. Other: Intact abdominal wall. Diffuse subcutaneous body edema compatible with anasarca. There is subtle left upper quadrant mesenteric/omental nodularity which may represent early peritoneal disease/carcinomatosis. This also is limited in evaluation because of lack of IV contrast. Musculoskeletal: Degenerative changes noted of the spine. Lower lumbar facet arthropathy evident. IMPRESSION: Collapsed stomach with a large portion posterior to the left hepatic lobe. Liver is also enlarged. Other complicating features for G-tube placement included abdominopelvic ascites, concern for peritoneal carcinomatosis, and body anasarca. Therefore, imaging findings are not favorable for fluoroscopic gastrostomy technique /  placement. Cholelithiasis 10 cm left renal mass compatible with a known renal cell carcinoma, difficult to exclude associated hemorrhage. Also concern for left periaortic developing adenopathy. Bilateral pleural effusions Marked cardiomegaly Aortic Atherosclerosis (ICD10-I70.0). Electronically Signed   By: Jerilynn Mages.  Shick M.D.   On: 03/10/2021 10:39   US Renal  Result Date: 03/04/2021 CLINICAL DATA:  Renal failure EXAM: RENAL / URINARY TRACT ULTRASOUND COMPLETE COMPARISON:  CT 02/21/2020 FINDINGS: Right Kidney: Renal measurements: 13.8 x 5.5 x 5.6 cm = volume: 221 mL. Echogenicity remains within normal limits. Some mild renal sinus lipomatosis, often senescent. Two small simple appearing exophytic cyst in the mid to upper pole right kidney, largest measuring 2.4 cm. The second measuring 1.6 cm. No concerning internal complexity. No concerning renal mass. No visible shadowing calculus or hydronephrosis. Left Kidney: Renal measurements: 13 x 4.5 x 4.9 cm = volume: 149 mL. Echogenicity within normal limits. Large masslike focus measuring 10.9 x 7.1 x 8.4 cm seen along the anterior margin of left kidney with some indentation of the renal parenchyma. Of note, a large subcapsular hematoma with seen in this vicinity previously. This could reflect some chronic hematoma or an underlying mass lesion, incompletely characterized on this exam. No hydronephrosis or shadowing calculus. Bladder: Appears normal for degree of bladder distention. Other: None. IMPRESSION: Large heterogeneous masslike focus abutting the left renal parenchyma. This is in the vicinity of a large hematoma seen on comparison imaging from 2021. Could reflect some recurrent or a degree of chronic hemorrhage though an underlying mass lesion is not fully excluded. Consider renal protocol CT or MR imaging for further characterization. Simple appearing cysts in the kidneys. Electronically Signed   By: Lovena Le M.D.   On: 03/04/2021 23:15   DG Chest Port 1  View  Result Date: 03/04/2021 CLINICAL DATA:  Syncope EXAM: PORTABLE CHEST 1 VIEW COMPARISON:  07/06/2020 FINDINGS: Mild cardiac enlargement. Left coronary stent. Negative for heart failure. Surgical clips left hilum from  prior resection. Lungs well aerated and clear. No infiltrate or effusion. No change from the prior study. IMPRESSION: No active disease. Electronically Signed   By: Franchot Gallo M.D.   On: 03/04/2021 21:20   DG Abd Portable 1V  Result Date: 03/06/2021 CLINICAL DATA:  Feeding tube placement EXAM: PORTABLE ABDOMEN - 1 VIEW COMPARISON:  03/06/2021 FINDINGS: Feeding tube tip is in the distal stomach. Nonobstructive bowel gas pattern. IMPRESSION: Feeding tube tip in the distal stomach. Electronically Signed   By: Rolm Baptise M.D.   On: 03/06/2021 23:38   DG Abd Portable 1V  Result Date: 03/06/2021 CLINICAL DATA:  NG tube placement EXAM: PORTABLE ABDOMEN - 1 VIEW COMPARISON:  None. FINDINGS: Nonobstructive pattern of bowel gas. Non weighted enteric feeding tube is positioned with tip below the diaphragm, tip in the vicinity of the pylorus or duodenal bulb. No free air in the abdomen on single supine radiograph. IMPRESSION: 1. Non weighted enteric feeding tube is positioned with tip below the diaphragm, tip in the vicinity of the pylorus or duodenal bulb. Recommend advancement to ensure post pyloric placement. 2. Nonobstructive pattern of bowel gas. No free air in the abdomen on single supine radiograph. Electronically Signed   By: Eddie Candle M.D.   On: 03/06/2021 16:17    Labs:  CBC: Recent Labs    03/06/21 0258 03/07/21 0623 03/09/21 0215 03/10/21 0339  WBC 15.8* 14.8* 15.9* 16.6*  HGB 9.4* 8.4* 8.5* 9.3*  HCT 31.2* 26.9* 27.5* 30.1*  PLT 444* 333 321 381    COAGS: Recent Labs    07/01/20 0000  INR 1.3*    BMP: Recent Labs    04/30/20 1154 07/02/20 0000 07/17/20 1424 07/17/20 1424 08/12/20 1046 08/20/20 1059 11/20/20 1033 03/07/21 0623 03/08/21 0304  03/09/21 0215 03/10/21 0339  NA 138   < > 134*  --  132* 130*   < > 137 132* 135 135  K 3.5   < > 4.5  --  4.5 4.5   < > 4.7 5.1 4.9 4.4  CL 100   < > 100  --  98 98   < > 110 107 106 101  CO2 29   < > 25  --  18* 17*   < > 17* 15* 17* 21*  GLUCOSE 111*   < > 237*   < > 179* 273*   < > 100* 160* 74 118*  BUN 12   < > 13  --  23 21   < > 62* 62* 59* 57*  CALCIUM 9.7   < > 8.9  --  8.8 9.0   < > 9.0 8.8* 8.9 9.1  CREATININE 0.97   < > 0.82  --  0.80 0.84   < > 1.69* 1.75* 1.77* 1.87*  GFRNONAA 82   < > 93   < > 94 92   < > 44* 43* 42* 39*  GFRAA 95  --  108  --  108 106  --   --   --   --   --    < > = values in this interval not displayed.    LIVER FUNCTION TESTS: Recent Labs    11/20/20 1033 03/04/21 2052 03/06/21 0258 03/09/21 0215  BILITOT 0.8 0.8 1.0 0.8  AST _0 47*  ALT _1 33  ALKPHOS 278* 202* 174* 280*  PROT 7.9 7.0 6.6 6.3*  ALBUMIN 3.0* 2.0* 1.7* 1.8*     Assessment and Plan: 64  y.o. male inpatient. History of left renal cell carcinoma, CAD s/p CABG, CHF, a fib on eliquis. 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 Presented to the ED at [facility] on 7.27.22 with AMS. Found to be hyperglycemic with hyperkalemia and leukocytosis.  Hospital course complicated by AKI and  poor oral intact. Team is requesting a gastrostomy tube for ongoing nutritional access.   CT abd pelvis shows stomach in an accessible position. Currently without NG tube or Coretrack. WBC is 16.6 (thought to be reactive in nature), BUN 57, Cr 1.87. Patient is on eliquis Last dose on 8.1.22. Allergies include morphine  IR consulted for possible gastrostomy tube placement. Case has been reviewed and procedure approved by Dr. Maryelizabeth Kaufmann.  Patient tentatively scheduled for 8.4.22.  Team instructed to: Keep Patient to be NPO after midnight Hold prophylactic anticoagulation 48 hours prior to scheduled procedure.  IR will call patient when ready.  Risks and benefits  image guided gastrostomy tube placement was discussed with the patient including, but not limited to the need for a barium enema during the procedure, bleeding, infection, peritonitis and/or damage to adjacent structures.  All of the patient's questions were answered, patient is agreeable to proceed.  Consent signed and in IR control room  Thank you for this interesting consult.  I greatly enjoyed meeting Eldra F Oliff and look forward to participating in their care.  A copy of this report was sent to the requesting provider on this date.  Electronically Signed: Jacqualine Mau, NP 03/10/2021, 12:53 PM   I spent a total of 40 Minutes    in face to face in clinical consultation, greater than 50% of which was counseling/coordinating care for gastrotomy tube placement

## 2021-03-10 NOTE — Progress Notes (Signed)
Calorie Count Note  48 hour calorie count ordered.  Diet: regular Supplements: Ensure Enlive po TID, each supplement provides 350 kcal and 20 grams of protein; Magic cup TID with meals, each supplement provides 290 kcal and 9 grams of protein   Case discussed with RN. Pt consuming very little off meals tray and only taking sips of Ensure.   Per MD notes, IR consult pending. Wife and family still deciding on PEG.  8/2 Breakfast: 360 kcals, 13 grams Lunch: 183 kcals, 7 grams protein Dinner: nothing documented Supplements: a few sips of Ensure (35 kcals, 2 grams protein)  Total intake: 578 kcal (26% of minimum estimated needs)  22 grams protein (18% of minimum estimated needs)  Nutrition Dx: Severe Malnutrition related to chronic illness (renal cell carcinoma) as evidenced by severe fat depletion, severe muscle depletion, percent weight loss; ongoing  Goal: Patient will meet greater than or equal to 90% of their needs; progressing   Intervention:   -Continue Ensure Enlive po TID, each supplement provides 350 kcal and 20 grams of protein  -Continue MVI with minerals daily -Continue Magic cup TID with meals, each supplement provides 290 kcal and 9 grams of protein  -Continue 48 hour calorie count  Larry Garner, RD, LDN, [place] Registered Dietitian II Certified Diabetes Care and Education Specialist Please refer to [facility] for RD and/or RD on-call/weekend/after hours pager

## 2021-03-11 ENCOUNTER — Encounter (HOSPITAL_COMMUNITY): Payer: Self-pay | Admitting: Internal Medicine

## 2021-03-11 DIAGNOSIS — N179 Acute kidney failure, unspecified: Secondary | ICD-10-CM | POA: Diagnosis present

## 2021-03-11 DIAGNOSIS — E1100 Type 2 diabetes mellitus with hyperosmolarity without nonketotic hyperglycemic-hyperosmolar coma (NKHHC): Secondary | ICD-10-CM | POA: Diagnosis not present

## 2021-03-11 DIAGNOSIS — L8990 Pressure ulcer of unspecified site, unspecified stage: Secondary | ICD-10-CM | POA: Insufficient documentation

## 2021-03-11 DIAGNOSIS — E1165 Type 2 diabetes mellitus with hyperglycemia: Secondary | ICD-10-CM | POA: Diagnosis not present

## 2021-03-11 LAB — GLUCOSE, CAPILLARY
Glucose-Capillary: 102 mg/dL — ABNORMAL HIGH (ref 70–99)
Glucose-Capillary: 222 mg/dL — ABNORMAL HIGH (ref 70–99)
Glucose-Capillary: 247 mg/dL — ABNORMAL HIGH (ref 70–99)
Glucose-Capillary: 290 mg/dL — ABNORMAL HIGH (ref 70–99)
Glucose-Capillary: 327 mg/dL — ABNORMAL HIGH (ref 70–99)
Glucose-Capillary: 99 mg/dL (ref 70–99)

## 2021-03-11 LAB — CBC WITH DIFFERENTIAL/PLATELET
Abs Immature Granulocytes: 0.14 10*3/uL — ABNORMAL HIGH (ref 0.00–0.07)
Basophils Absolute: 0 10*3/uL (ref 0.0–0.1)
Basophils Relative: 0 %
Eosinophils Absolute: 0.1 10*3/uL (ref 0.0–0.5)
Eosinophils Relative: 1 %
HCT: 29.2 % — ABNORMAL LOW (ref 39.0–52.0)
Hemoglobin: 8.7 g/dL — ABNORMAL LOW (ref 13.0–17.0)
Immature Granulocytes: 1 %
Lymphocytes Relative: 7 %
Lymphs Abs: 1.2 10*3/uL (ref 0.7–4.0)
MCH: 25.7 pg — ABNORMAL LOW (ref 26.0–34.0)
MCHC: 29.8 g/dL — ABNORMAL LOW (ref 30.0–36.0)
MCV: 86.4 fL (ref 80.0–100.0)
Monocytes Absolute: 1.7 10*3/uL — ABNORMAL HIGH (ref 0.1–1.0)
Monocytes Relative: 10 %
Neutro Abs: 14.4 10*3/uL — ABNORMAL HIGH (ref 1.7–7.7)
Neutrophils Relative %: 81 %
Platelets: 389 10*3/uL (ref 150–400)
RBC: 3.38 MIL/uL — ABNORMAL LOW (ref 4.22–5.81)
RDW: 17.7 % — ABNORMAL HIGH (ref 11.5–15.5)
WBC: 17.6 10*3/uL — ABNORMAL HIGH (ref 4.0–10.5)
nRBC: 0 % (ref 0.0–0.2)

## 2021-03-11 LAB — COMPREHENSIVE METABOLIC PANEL
ALT: 64 U/L — ABNORMAL HIGH (ref 0–44)
AST: 54 U/L — ABNORMAL HIGH (ref 15–41)
Albumin: 1.9 g/dL — ABNORMAL LOW (ref 3.5–5.0)
Alkaline Phosphatase: 313 U/L — ABNORMAL HIGH (ref 38–126)
Anion gap: 12 (ref 5–15)
BUN: 55 mg/dL — ABNORMAL HIGH (ref 8–23)
CO2: 22 mmol/L (ref 22–32)
Calcium: 9.2 mg/dL (ref 8.9–10.3)
Chloride: 101 mmol/L (ref 98–111)
Creatinine, Ser: 1.96 mg/dL — ABNORMAL HIGH (ref 0.61–1.24)
GFR, Estimated: 37 mL/min — ABNORMAL LOW (ref >60)
Glucose, Bld: 131 mg/dL — ABNORMAL HIGH (ref 70–99)
Potassium: 4.3 mmol/L (ref 3.5–5.1)
Sodium: 135 mmol/L (ref 135–145)
Total Bilirubin: 1 mg/dL (ref 0.3–1.2)
Total Protein: 6.5 g/dL (ref 6.5–8.1)

## 2021-03-11 LAB — MAGNESIUM: Magnesium: 1.8 mg/dL (ref 1.7–2.4)

## 2021-03-11 NOTE — Progress Notes (Addendum)
Progress Note    Larry Garner  [id number] DOB: June 29, 1956  DOA: 03/04/2021 PCP: Marin Olp, MD      Brief Narrative:    Medical records reviewed and are as summarized below:  Larry Garner is a 65 y.o. 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 fib on Eliquis, anxiety, who presented to the hospital for oral intake, dizziness and significant weight loss.  He was hyperglycemic with glucose of 664, bicarb 15, creatinine of 2, potassium of 5.6 and WBC of 14.2.  He was admitted to the hospital for hyperosmolar hyperglycemic state and AKI complicated by hyperkalemia and non-anion gap metabolic acidosis.      Assessment/Plan:   Principal Problem:   Hyperosmolar hyperglycemic state (HHS) ([place]) Active Problems:   Pressure injury of skin   AKI (acute kidney injury) ([place])   Nutrition Problem: Severe Malnutrition Etiology: chronic illness (renal cell carcinoma)  Signs/Symptoms: severe fat depletion, severe muscle depletion, percent weight loss Percent weight loss: 11.5 %   Body mass index is 19.77 kg/m.    Hyperosmolar hyperglycemic state, hypoglycemia: Glucose level have improved.  He developed hypoglycemia on 03/06/2021 so Lantus was discontinued.  Continue NovoLog as needed for hyperglycemia.  Monitor glucose levels closely.  Poor oral intake: Continue mirtazapine.  Cortrak tube was placed on 03/06/2021 but this was dislodged.  NG tube was placed on 03/07/2021.  This was also dislodged on 03/08/2021.  Plan for PEG tube placement tomorrow by IR.  However, his wife has concerns regarding PEG tube placement because of abnormal findings on CT abdomen pelvis including ascites, concern for peritoneal carcinomatosis and body  anasarca.  Follow-up with IR for further recommendations.  AKI: Creatinine is still not back to baseline.  Monitor BMP. Hyperkalemia and metabolic acidosis have resolved.  Chronic systolic and diastolic CHF: Compensated.  2D echo in May 2020 showed EF estimated at 35 to [id number], grade 2 diastolic dysfunction.  Entresto, Aldactone and Lasix have been held because of AKI.  Left renal cell carcinoma: He is on low-dose prednisone for immunotherapy.  Outpatient follow-up with oncologist.  He was supposed to undergo surgery in December 2022 but this was canceled because of acute stroke.  Chronic leukocytosis: This may be due to steroids.  Monitor CBC.  Stage I medial coccygeal decubitus ulcer (present on admission): Apply Mepilex border  Debility: PT recommends home health therapy.  Other comorbidities include chronic atrial fibrillation, CAD s/p CABG, hyperlipidemia, anemia of chronic disease, history of stroke   Diet Order             Diet NPO time specified Except for: Sips with Meds  Diet effective midnight           Diet regular Room service appropriate? Yes; Fluid consistency: Thin  Diet effective now                      Consultants: Interventional radiologist  Procedures: None    Medications:    bisoprolol  10 mg Oral Daily   feeding supplement  237 mL Oral TID BM   folic acid  1 mg Oral Daily   insulin aspart  0-5 Units Subcutaneous QHS   insulin aspart  0-9 Units Subcutaneous TID WC   mirtazapine  15 mg Oral QHS  multivitamin with minerals  1 tablet Oral Daily   predniSONE  5 mg Oral Q breakfast   rosuvastatin  20 mg Oral Daily   cyanocobalamin  1,000 mcg Oral Daily   Continuous Infusions:  [START ON 03/12/2021]  ceFAZolin (ANCEF) IV     sodium chloride       Anti-infectives (From admission, onward)    Start     Dose/Rate Route Frequency Ordered Stop   03/12/21 0700  ceFAZolin (ANCEF) IVPB 2g/100 mL premix        2 g 200 mL/hr over 30 Minutes  Intravenous  Once 03/10/21 1526     03/05/21 1000  valACYclovir (VALTREX) tablet 1,000 mg  Status:  Discontinued        1,000 mg Oral Daily 03/04/21 2305 03/05/21 0804   03/05/21 0800  cefTRIAXone (ROCEPHIN) 1 g in sodium chloride 0.9 % 100 mL IVPB  Status:  Discontinued        1 g 200 mL/hr over 30 Minutes Intravenous Every 24 hours 03/05/21 0612 03/05/21 1311              Family Communication/Anticipated D/C date and plan/Code Status   DVT prophylaxis: SCDs Start: 03/04/21 2240     Code Status: Full Code  Family Communication: Wife at the bedside Disposition Plan:    Status is: Inpatient  Remains inpatient appropriate because:Inpatient level of care appropriate due to severity of illness  Dispo:  Patient From: Home  Planned Disposition: Home  Medically stable for discharge: No             Subjective:   Interval events noted.  He feels better today.  No abdominal pain, shortness of breath or chest pain.  Objective:    Vitals:   03/11/21 0416 03/11/21 0826 03/11/21 1307 03/11/21 1545  BP: 135/77 102/61 128/80 104/60  Pulse: 75 66 69 66  Resp: 20 18    Temp: 98.5 F (36.9 C) 97.8 F (36.6 C)    TempSrc: Oral Oral    SpO2: 98% 98% 99% 99%  Weight: 64.3 kg     Height:       No data found.   Intake/Output Summary (Last 24 hours) at 03/11/2021 1617 Last data filed at 03/11/2021 1612 Gross per 24 hour  Intake 958 ml  Output 500 ml  Net 458 ml   Filed Weights   03/09/21 0433 03/10/21 0327 03/11/21 0416  Weight: 65.6 kg 65.4 kg 64.3 kg    Exam:  GEN: NAD SKIN: Warm and dry.  Stage I coccygeal decubitus ulcer EYES: No pallor or icterus ENT: MMM CV: RRR PULM: CTA B ABD: soft, ND, NT, +BS CNS: AAO x 3, non focal EXT: No edema or tenderness    Pressure Injury 03/05/21 Coccyx Medial Stage 1 -  Intact skin with non-blanchable redness of a localized area usually over a bony prominence. (Active)  03/05/21 1035  Location: Coccyx  Location  Orientation: Medial  Staging: Stage 1 -  Intact skin with non-blanchable redness of a localized area usually over a bony prominence.  Wound Description (Comments):   Present on Admission: Yes     Data Reviewed:   I have personally reviewed following labs and imaging studies:  Labs: Labs show the following:   Basic Metabolic Panel: Recent Labs  Lab 03/05/21 0642 03/05/21 1041 03/06/21 0258 03/07/21 0623 03/08/21 0304 03/09/21 0215 03/10/21 0339 03/11/21 0240  NA 137   < > 137 137 132* 135 135 135  K 4.5   < >  4.7 4.7 5.1 4.9 4.4 4.3  CL 112*   < > 107 110 107 106 101 101  CO2 19*   < > 20* 17* 15* 17* 21* 22  GLUCOSE 90   < > 66* 100* 160* 74 118* 131*  BUN 87*   < > 75* 62* 62* 59* 57* 55*  CREATININE 1.78*   < > 1.77* 1.69* 1.75* 1.77* 1.87* 1.96*  CALCIUM 9.4   < > 9.3 9.0 8.8* 8.9 9.1 9.2  MG 1.8  --   --  1.7 1.8 1.8 1.9 1.8  PHOS 1.8*  --  3.0 3.5  --   --   --   --    < > = values in this interval not displayed.   GFR Estimated Creatinine Clearance: 34.2 mL/min (A) (by C-G formula based on SCr of 1.96 mg/dL (H)). Liver Function Tests: Recent Labs  Lab 03/04/21 2052 03/06/21 0258 03/09/21 0215 03/11/21 0240  AST 17 16 47* 54*  ALT 23 19 33 64*  ALKPHOS 202* 174* 280* 313*  BILITOT 0.8 1.0 0.8 1.0  PROT 7.0 6.6 6.3* 6.5  ALBUMIN 2.0* 1.7* 1.8* 1.9*   No results for input(s): LIPASE, AMYLASE in the last 168 hours. Recent Labs  Lab 03/07/21 0623  AMMONIA 29   Coagulation profile No results for input(s): INR, PROTIME in the last 168 hours.  CBC: Recent Labs  Lab 03/04/21 2052 03/04/21 2111 03/06/21 0258 03/07/21 0623 03/09/21 0215 03/10/21 0339 03/11/21 0240  WBC 14.2*   < > 15.8* 14.8* 15.9* 16.6* 17.6*  NEUTROABS 12.6*  --   --  12.3* 13.1* 13.6* 14.4*  HGB 9.6*   < > 9.4* 8.4* 8.5* 9.3* 8.7*  HCT 32.6*   < > 31.2* 26.9* 27.5* 30.1* 29.2*  MCV 89.8   < > 86.4 85.1 86.2 86.0 86.4  PLT 409*   < > 444* 333 321 381 389   < > = values in  this interval not displayed.   Cardiac Enzymes: Recent Labs  Lab 03/04/21 2052  CKTOTAL <5*   BNP (last 3 results) No results for input(s): PROBNP in the last 8760 hours. CBG: Recent Labs  Lab 03/11/21 0006 03/11/21 0421 03/11/21 0833 03/11/21 1202 03/11/21 1545  GLUCAP 222* 99 102* 247* 327*   D-Dimer: No results for input(s): DDIMER in the last 72 hours. Hgb A1c: No results for input(s): HGBA1C in the last 72 hours. Lipid Profile: No results for input(s): CHOL, HDL, LDLCALC, TRIG, CHOLHDL, LDLDIRECT in the last 72 hours. Thyroid function studies: No results for input(s): TSH, T4TOTAL, T3FREE, THYROIDAB in the last 72 hours.  Invalid input(s): FREET3 Anemia work up: No results for input(s): VITAMINB12, FOLATE, FERRITIN, TIBC, IRON, RETICCTPCT in the last 72 hours. Sepsis Labs: Recent Labs  Lab 03/04/21 2122 03/05/21 0131 03/05/21 [id number] 03/05/21 1041 03/06/21 0258 03/07/21 0623 03/09/21 0215 03/10/21 0339 03/11/21 0240  PROCALCITON  --   --  0.49  --   --   --   --   --   --   WBC  --   --   --  12.4*   < > 14.8* 15.9* 16.6* 17.6*  LATICACIDVEN 2.5* 2.3* 1.3 1.3  --   --   --   --   --    < > = values in this interval not displayed.    Microbiology Recent Results (from the past 240 hour(s))  Blood culture (routine x 2)     Status: None  Collection Time: 03/04/21  9:00 PM   Specimen: BLOOD  Result Value Ref Range Status   Specimen Description BLOOD LEFT ANTECUBITAL  Final   Special Requests   Final    BOTTLES DRAWN AEROBIC AND ANAEROBIC Blood Culture adequate volume   Culture   Final    NO GROWTH 5 DAYS Performed at [facility], [address], [place], [place] [address]    Report Status 03/09/2021 FINAL  Final  Blood culture (routine x 2)     Status: None   Collection Time: 03/04/21  9:01 PM   Specimen: BLOOD RIGHT FOREARM  Result Value Ref Range Status   Specimen Description BLOOD RIGHT FOREARM  Final   Special Requests   Final    BOTTLES  DRAWN AEROBIC AND ANAEROBIC Blood Culture adequate volume   Culture   Final    NO GROWTH 5 DAYS Performed at [facility], [place] [address] [place], [place] [address]    Report Status 03/09/2021 FINAL  Final  Resp Panel by RT-PCR (Flu A&B, Covid) Nasopharyngeal Swab     Status: None   Collection Time: 03/04/21 11:08 PM   Specimen: Nasopharyngeal Swab; Nasopharyngeal(NP) swabs in vial transport medium  Result Value Ref Range Status   SARS Coronavirus 2 by RT PCR NEGATIVE NEGATIVE Final    Comment: (NOTE) SARS-CoV-2 target nucleic acids are NOT DETECTED.  The SARS-CoV-2 RNA is generally detectable in upper respiratory specimens during the acute phase of infection. The lowest concentration of SARS-CoV-2 viral copies this assay can detect is 138 copies/mL. A negative result does not preclude SARS-Cov-2 infection and should not be used as the sole basis for treatment or other patient management decisions. A negative result may occur with  improper specimen collection/handling, submission of specimen other than nasopharyngeal swab, presence of viral mutation(s) within the areas targeted by this assay, and inadequate number of viral copies(<138 copies/mL). A negative result must be combined with clinical observations, patient history, and epidemiological information. The expected result is Negative.  Fact Sheet for Patients:  EntrepreneurPulse.com.au  Fact Sheet for Healthcare Providers:  IncredibleEmployment.be  This test is no t yet approved or cleared by the [place] FDA and  has been authorized for detection and/or diagnosis of SARS-CoV-2 by FDA under an Emergency Use Authorization (EUA). This EUA will remain  in effect (meaning this test can be used) for the duration of the COVID-19 declaration under Section 564(b)(1) of the Act, 21 U.S.C.section 360bbb-3(b)(1), unless the authorization is terminated  or revoked sooner.        Influenza A by PCR NEGATIVE NEGATIVE Final   Influenza B by PCR NEGATIVE NEGATIVE Final    Comment: (NOTE) The Xpert Xpress SARS-CoV-2/FLU/RSV plus assay is intended as an aid in the diagnosis of influenza from Nasopharyngeal swab specimens and should not be used as a sole basis for treatment. Nasal washings and aspirates are unacceptable for Xpert Xpress SARS-CoV-2/FLU/RSV testing.  Fact Sheet for Patients: EntrepreneurPulse.com.au  Fact Sheet for Healthcare Providers: IncredibleEmployment.be  This test is not yet approved or cleared by the [place] FDA and has been authorized for detection and/or diagnosis of SARS-CoV-2 by FDA under an Emergency Use Authorization (EUA). This EUA will remain in effect (meaning this test can be used) for the duration of the COVID-19 declaration under Section 564(b)(1) of the Act, 21 U.S.C. section 360bbb-3(b)(1), unless the authorization is terminated or revoked.  Performed at [facility], [place] [address], [place], [place] [address]  Urine Culture     Status: Abnormal   Collection Time: 03/05/21  8:06 PM   Specimen: Urine, Clean Catch  Result Value Ref Range Status   Specimen Description URINE, CLEAN CATCH  Final   Special Requests   Final    NONE Performed at [facility], [place] [address], [place], [name] [address]    Culture MULTIPLE SPECIES PRESENT, SUGGEST RECOLLECTION (A)  Final   Report Status 03/08/2021 FINAL  Final    Procedures and diagnostic studies:  CT ABDOMEN WO CONTRAST  Result Date: 03/10/2021 CLINICAL DATA:  Dysphagia, weight loss, deconditioned, stroke, AFib, assess for feeding 2 EXAM: CT ABDOMEN WITHOUT CONTRAST TECHNIQUE: Multidetector CT imaging of the abdomen was performed following the standard protocol without IV contrast. COMPARISON:  05/20/2020, 02/21/2020 FINDINGS: Lower chest: Small pleural effusions layering dependently, larger on the right. Minor basilar  atelectasis. Heart is enlarged. No pericardial effusion. Native coronary atherosclerosis noted. Previous median sternotomy. Degenerative changes noted of the spine. Hepatobiliary: Limited without IV contrast. Liver appears enlarged. Liver measures 20 cm in length. No large focal hepatic abnormality or intrahepatic biliary dilatation. Large peripherally calcified gallstone measuring 2.8 cm. Gallbladder nondistended. Common bile duct nondilated. Upper abdominal ascites about the liver and spleen. Pancreas: No large focal abnormality or ductal dilatation. Spleen: No focal splenic abnormality.  Surrounding ascites noted. Adrenals/Urinary Tract: Normal adrenal glands. Right kidney has known lateral exophytic cysts as before. No renal obstruction or hydronephrosis. Left kidney has a large mixed density mass measuring up to 10.4 cm compatible with a known left renal cell carcinoma. The mixed attenuation may represent a combination of renal mass and surrounding hemorrhage. No hydroureter Stomach/Bowel: Stomach is slightly high collapse. Portion of the stomach is posterior to the left hepatic lobe. Transverse colon is inferior to the stomach. No significant bowel dilatation, ileus pattern, or free air. No focal fluid collection or abscess appreciated. Vascular/Lymphatic: Aorta atherosclerotic. Negative for aneurysm. No retroperitoneal hemorrhage or hematoma. Limited assessment without IV contrast. Difficult to exclude developing left periaortic adenopathy possibly metastatic related to the large left renal mass. Other: Intact abdominal wall. Diffuse subcutaneous body edema compatible with anasarca. There is subtle left upper quadrant mesenteric/omental nodularity which may represent early peritoneal disease/carcinomatosis. This also is limited in evaluation because of lack of IV contrast. Musculoskeletal: Degenerative changes noted of the spine. Lower lumbar facet arthropathy evident. IMPRESSION: Collapsed stomach with a  large portion posterior to the left hepatic lobe. Liver is also enlarged. Other complicating features for G-tube placement included abdominopelvic ascites, concern for peritoneal carcinomatosis, and body anasarca. Therefore, imaging findings are not favorable for fluoroscopic gastrostomy technique / placement. Cholelithiasis 10 cm left renal mass compatible with a known renal cell carcinoma, difficult to exclude associated hemorrhage. Also concern for left periaortic developing adenopathy. Bilateral pleural effusions Marked cardiomegaly Aortic Atherosclerosis (ICD10-I70.0). Electronically Signed   By: Jerilynn Mages.  Shick M.D.   On: 03/10/2021 10:39               LOS: 6 days   Larry Garner  [facility]   Pager on [email or web address]. If 7PM-7AM, please contact night-coverage at [email or web address]     03/11/2021, 4:17 PM

## 2021-03-11 NOTE — Progress Notes (Addendum)
Calorie Count Note  48 hour calorie count ordered.  Diet: regular Supplements: Ensure Enlive po TID, each supplement provides 350 kcal and 20 grams of protein; Magic cup TID with meals, each supplement provides 290 kcal and 9 grams of protein   ADDENDUM (1403): Case discussed with RN, who reports pt wife has concerns with PEG tube after talking with pt's outpatient oncologist. Spoke with pt wife, who reports she and the oncologist are concerned with imaging studies and potential PEG placement. She would like for IR to discuss case with oncology. RD obtained oncology contact information (Dr. Roslyn Smiling [phone]) and passed message along to IR MD and PA that assessed pt yesterday.   8/2 Breakfast: 360 kcals, 13 grams Lunch: 183 kcals, 7 grams protein Dinner: 181 kcals, 5 grams protein Supplements: a few sips of Ensure (35 kcals, 2 grams protein)  Total intake: 759 kcal (35% of minimum estimated needs)  27 grams protein (22% of minimum estimated needs)  Nutrition Dx: Severe Malnutrition related to chronic illness (renal cell carcinoma) as evidenced by severe fat depletion, severe muscle depletion, percent weight loss; ongoing   Goal: Patient will meet greater than or equal to 90% of their needs; progressing    Intervention:    -Continue Ensure Enlive po TID, each supplement provides 350 kcal and 20 grams of protein  -Continue MVI with minerals daily -Continue Magic cup TID with meals, each supplement provides 290 kcal and 9 grams of protein  -D/c 48 hour calorie count -If PEG is pursued, recommend:  Initiate Osmolite 1.2 @ 20 ml/hr via cortrak tube and increase by 10 ml every 12 hours to goal rate of 60 ml/hr.   45 ml Prosource TF TID.     150 ml free water flush every 4 hours   Tube feeding regimen provides 2280 kcal (100% of needs), 123 grams of protein, and 1097 ml of H2O.  Total free water: 1997 ml/ day  Loistine Chance, RD, LDN, [place] Registered Dietitian II Certified  Diabetes Care and Education Specialist Please refer to [facility] for RD and/or RD on-call/weekend/after hours pager

## 2021-03-11 NOTE — Progress Notes (Signed)
Inpatient Diabetes Program Recommendations  AACE/ADA: New Consensus Statement on Inpatient Glycemic Control   Target Ranges:  Prepandial:   less than 140 mg/dL      Peak postprandial:   less than 180 mg/dL (1-2 hours)      Critically ill patients:  140 - 180 mg/dL  Results for CLEMENTE, HANDLEY (MRN [id number]) as of 03/11/2021 10:51  Ref. Range 03/10/2021 08:38 03/10/2021 10:54 03/10/2021 16:04 03/10/2021 20:11 03/11/2021 00:06 03/11/2021 04:21 03/11/2021 08:33  Glucose-Capillary Latest Ref Range: 70 - 99 mg/dL 169 (H) 190 (H) 267 (H) 379 (H) 222 (H) 99 102 (H)    Review of Glycemic Control   Current orders for Inpatient glycemic control: Novolog 0-9 units TID, Novolog 0-5 units QHS; Prednisone 5 mg daily   Inpatient Diabetes Program Recommendations:     Insulin: If steroids are continued, please consider ordering Novolog 2 units TID with meals for meal coverage if patient eats at least 50% of meals.   Thanks, Barnie Alderman, RN, MSN, CDE Diabetes Coordinator Inpatient Diabetes Program [phone] (Team Pager from 8am to 5pm)

## 2021-03-11 NOTE — Progress Notes (Signed)
Physical Therapy Treatment Patient Details Name: Larry Garner MRN: [id number] DOB: 1955-11-26 Today's Date: 03/11/2021    History of Present Illness Pt is a 65 y/o male admitted 7/27 secondary to HHS in the setting of diet noncompliance and dehydration from poor oral intake and DKA. PMH includes CVA with R sided and cognitive deficits, CHF, DM, HTN, and renal cancer.    PT Comments    Pt tolerates treatment well with improved enthusiasm for mobility this session. Pt demonstrates increased ambulation distances and progression to dynamic gait tasks. Pt will continue to benefit from acute PT services to improve upon strength and balance deficits amid ongoing nutrition concerns.   Follow Up Recommendations  Home health PT;Supervision/Assistance - 24 hour     Equipment Recommendations  Hospital bed    Recommendations for Other Services       Precautions / Restrictions Precautions Precautions: Fall Restrictions Weight Bearing Restrictions: No    Mobility  Bed Mobility Overal bed mobility: Needs Assistance Bed Mobility: Supine to Sit     Supine to sit: Min assist     General bed mobility comments: minA for direction of LEs and to pull up through PT UEs    Transfers Overall transfer level: Needs assistance Equipment used: Rolling walker (2 wheeled) Transfers: Sit to/from Stand Sit to Stand: Min guard            Ambulation/Gait Ambulation/Gait assistance: Min guard Gait Distance (Feet): 225 Feet (additional trial of 100) Assistive device: Rolling walker (2 wheeled) Gait Pattern/deviations: Step-through pattern Gait velocity: reduced Gait velocity interpretation: <1.8 ft/sec, indicate of risk for recurrent falls General Gait Details: pt with slowed step-through gait, widened turns. Pt ableto ambulate backward for 5' without LOB   Stairs             Wheelchair Mobility    Modified Rankin (Stroke Patients Only)       Balance Overall balance  assessment: Needs assistance Sitting-balance support: No upper extremity supported;Feet supported Sitting balance-Leahy Scale: Good     Standing balance support: Single extremity supported;Bilateral upper extremity supported Standing balance-Leahy Scale: Poor Standing balance comment: reliant on UE support of walker                            Cognition Arousal/Alertness: Awake/alert Behavior During Therapy: WFL for tasks assessed/performed Overall Cognitive Status: Impaired/Different from baseline Area of Impairment: Attention;Memory;Following commands;Safety/judgement;Awareness;Problem solving                   Current Attention Level: Selective Memory: Decreased short-term memory Following Commands: Follows one step commands consistently;Follows multi-step commands inconsistently Safety/Judgement: Decreased awareness of safety;Decreased awareness of deficits Awareness: Emergent Problem Solving: Slow processing;Decreased initiation;Difficulty sequencing        Exercises      General Comments General comments (skin integrity, edema, etc.): VSS on RA      Pertinent Vitals/Pain Pain Assessment: No/denies pain    Home Living                      Prior Function            PT Goals (current goals can now be found in the care plan section) Acute Rehab PT Goals Patient Stated Goal: to go home Progress towards PT goals: Progressing toward goals    Frequency    Min 3X/week      PT Plan Current plan remains appropriate    Co-evaluation  AM-PAC PT "6 Clicks" Mobility   Outcome Measure  Help needed turning from your back to your side while in a flat bed without using bedrails?: A Little Help needed moving from lying on your back to sitting on the side of a flat bed without using bedrails?: A Little Help needed moving to and from a bed to a chair (including a wheelchair)?: A Little Help needed standing up from a chair  using your arms (e.g., wheelchair or bedside chair)?: A Little Help needed to walk in hospital room?: A Little Help needed climbing 3-5 steps with a railing? : A Lot 6 Click Score: 17    End of Session   Activity Tolerance: Patient tolerated treatment well Patient left: in chair;with call bell/phone within reach;with chair alarm set;with family/visitor present Nurse Communication: Mobility status PT Visit Diagnosis: Unsteadiness on feet (R26.81);Muscle weakness (generalized) (M62.81)     Time: [id number] PT Time Calculation (min) (ACUTE ONLY): 23 min  Charges:  $Gait Training: 23-37 mins                     Zenaida Niece, PT, DPT Acute Rehabilitation Pager: [phone]    Zenaida Niece 03/11/2021, 12:25 PM

## 2021-03-12 ENCOUNTER — Other Ambulatory Visit: Payer: Self-pay | Admitting: Family Medicine

## 2021-03-12 DIAGNOSIS — E1100 Type 2 diabetes mellitus with hyperosmolarity without nonketotic hyperglycemic-hyperosmolar coma (NKHHC): Secondary | ICD-10-CM | POA: Diagnosis not present

## 2021-03-12 DIAGNOSIS — E1165 Type 2 diabetes mellitus with hyperglycemia: Secondary | ICD-10-CM | POA: Diagnosis not present

## 2021-03-12 DIAGNOSIS — N179 Acute kidney failure, unspecified: Secondary | ICD-10-CM | POA: Diagnosis not present

## 2021-03-12 LAB — BASIC METABOLIC PANEL
Anion gap: 14 (ref 5–15)
BUN: 53 mg/dL — ABNORMAL HIGH (ref 8–23)
CO2: 21 mmol/L — ABNORMAL LOW (ref 22–32)
Calcium: 8.9 mg/dL (ref 8.9–10.3)
Chloride: 99 mmol/L (ref 98–111)
Creatinine, Ser: 1.94 mg/dL — ABNORMAL HIGH (ref 0.61–1.24)
GFR, Estimated: 38 mL/min — ABNORMAL LOW (ref >60)
Glucose, Bld: 187 mg/dL — ABNORMAL HIGH (ref 70–99)
Potassium: 4.5 mmol/L (ref 3.5–5.1)
Sodium: 134 mmol/L — ABNORMAL LOW (ref 135–145)

## 2021-03-12 LAB — CBC
HCT: 30.2 % — ABNORMAL LOW (ref 39.0–52.0)
Hemoglobin: 9 g/dL — ABNORMAL LOW (ref 13.0–17.0)
MCH: 26 pg (ref 26.0–34.0)
MCHC: 29.8 g/dL — ABNORMAL LOW (ref 30.0–36.0)
MCV: 87.3 fL (ref 80.0–100.0)
Platelets: 348 10*3/uL (ref 150–400)
RBC: 3.46 MIL/uL — ABNORMAL LOW (ref 4.22–5.81)
RDW: 18.2 % — ABNORMAL HIGH (ref 11.5–15.5)
WBC: 16.7 10*3/uL — ABNORMAL HIGH (ref 4.0–10.5)
nRBC: 0 % (ref 0.0–0.2)

## 2021-03-12 LAB — PROTIME-INR
INR: 1.7 — ABNORMAL HIGH (ref 0.8–1.2)
Prothrombin Time: 19.8 seconds — ABNORMAL HIGH (ref 11.4–15.2)

## 2021-03-12 LAB — GLUCOSE, CAPILLARY
Glucose-Capillary: 100 mg/dL — ABNORMAL HIGH (ref 70–99)
Glucose-Capillary: 107 mg/dL — ABNORMAL HIGH (ref 70–99)
Glucose-Capillary: 113 mg/dL — ABNORMAL HIGH (ref 70–99)
Glucose-Capillary: 187 mg/dL — ABNORMAL HIGH (ref 70–99)
Glucose-Capillary: 234 mg/dL — ABNORMAL HIGH (ref 70–99)
Glucose-Capillary: 315 mg/dL — ABNORMAL HIGH (ref 70–99)

## 2021-03-12 MED ORDER — LACTATED RINGERS IV SOLN: INTRAVENOUS | Status: DC

## 2021-03-12 NOTE — Progress Notes (Signed)
  I had a discussion patient's wife, [name] regarding Gastrostomy tube placement.  Risks and benefits image guided gastrostomy tube placement was discussed with the patient including, but not limited to the need for a barium enema during the procedure, bleeding, infection, peritonitis and/or damage to adjacent structures.  All questions were answered.  She also discussed with the patient's oncologist, Dr. Roslyn Smiling.   He does not recommend the Gtube at this time and she would like to hold off for now.  She requested I cancel the order, which I have done.  Selso Mannor S Elie Gragert PA-C 03/12/2021 10:35 AM

## 2021-03-12 NOTE — Progress Notes (Signed)
Inpatient Diabetes Program Recommendations  AACE/ADA: New Consensus Statement on Inpatient Glycemic Control   Target Ranges:  Prepandial:   less than 140 mg/dL      Peak postprandial:   less than 180 mg/dL (1-2 hours)      Critically ill patients:  140 - 180 mg/dL  Results for MALCOMB, ROELFS (MRN [id number]) as of 03/12/2021 08:55  Ref. Range 03/12/2021 00:20 03/12/2021 04:21 03/12/2021 08:14  Glucose-Capillary Latest Ref Range: 70 - 99 mg/dL 187 (H) 113 (H) 100 (H)   Results for MERRILL, SELDON (MRN [id number]) as of 03/12/2021 08:55  Ref. Range 03/11/2021 08:33 03/11/2021 12:02 03/11/2021 15:45 03/11/2021 20:35  Glucose-Capillary Latest Ref Range: 70 - 99 mg/dL 102 (H) 247 (H) 327 (H) 290 (H)   Results for DASHELL, DELEONE (MRN [id number]) as of 03/11/2021 10:51   Ref. Range 03/10/2021 08:38 03/10/2021 10:54 03/10/2021 16:04 03/10/2021 20:11 03/11/2021 00:06  Glucose-Capillary Latest Ref Range: 70 - 99 mg/dL 169 (H) 190 (H) 267 (H) 379 (H) 222 (H)     Review of Glycemic Control  Current orders for Inpatient glycemic control: Novolog 0-9 units TID, Novolog 0-5 units QHS; Prednisone 5 mg daily   Inpatient Diabetes Program Recommendations:     Insulin: Post prandial glucose is consistently elevated. If steroids are continued, please consider ordering Novolog 3 units TID with meals for meal coverage if patient eats at least 50% of meals.   Thanks, Barnie Alderman, RN, MSN, CDE Diabetes Coordinator Inpatient Diabetes Program [phone] (Team Pager from 8am to 5pm)

## 2021-03-12 NOTE — Progress Notes (Signed)
Progress Note    [name]  [id number] DOB: 1956-05-29  DOA: 03/04/2021 PCP: Marin Olp, MD      Brief Narrative:    Medical records reviewed and are as summarized below:  Larry Garner is a 65 y.o. 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 fib on Eliquis, anxiety, who presented to the hospital for oral intake, dizziness and significant weight loss.  He was hyperglycemic with glucose of 664, bicarb 15, creatinine of 2, potassium of 5.6 and WBC of 14.2.  He was admitted to the hospital for hyperosmolar hyperglycemic state and AKI complicated by hyperkalemia and non-anion gap metabolic acidosis.      Assessment/Plan:   Principal Problem:   Hyperosmolar hyperglycemic state (HHS) ([place]) Active Problems:   Pressure injury of skin   AKI (acute kidney injury) ([place])   Nutrition Problem: Severe Malnutrition Etiology: chronic illness (renal cell carcinoma)  Signs/Symptoms: severe fat depletion, severe muscle depletion, percent weight loss Percent weight loss: 11.5 %   Body mass index is 20.29 kg/m.    Hyperosmolar hyperglycemic state, hypoglycemia: Overall, glucose levels have improved.  He developed hypoglycemia on 03/06/2021 so Lantus was discontinued.  Continue NovoLog as needed for hyperglycemia.  Monitor glucose levels closely.  Poor oral intake: Continue mirtazapine.  Cortrak tube was placed on 03/06/2021 but this was dislodged.  NG tube was placed on 03/07/2021.  This was also dislodged on 03/08/2021.    Plan for PEG tube placement has been aborted.  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  AKI: Creatinine is still not back to baseline.  Restart IV fluids and monitor BMP.   Hyperkalemia and metabolic acidosis have resolved.  Chronic systolic and diastolic CHF: Compensated.  2D echo in May 2020 showed EF estimated at 35 to [id number], grade 2 diastolic dysfunction.  Entresto, Aldactone and Lasix have been held because of AKI.  Left renal cell carcinoma: He is on low-dose prednisone for immunotherapy.  Outpatient follow-up with oncologist.  He was supposed to undergo surgery in December 2022 but this was canceled because of acute stroke.  Chronic leukocytosis: This may be due to steroids.  Monitor CBC.  Stage I medial coccygeal decubitus ulcer (present on admission): Apply Mepilex border  Debility: PT recommends home health therapy.  Other comorbidities include chronic atrial fibrillation, CAD s/p CABG, hyperlipidemia, anemia of chronic disease, history of stroke.  Possible discharge home tomorrow.  Diet Order             Diet regular Room service appropriate? Yes with Assist; Fluid consistency: Thin  Diet effective now                      Consultants: Interventional radiologist  Procedures: None    Medications:    bisoprolol  10 mg Oral Daily   feeding supplement  237 mL Oral TID BM   folic acid  1 mg Oral Daily   insulin aspart  0-5 Units Subcutaneous QHS   insulin aspart  0-9 Units Subcutaneous TID WC   mirtazapine  15 mg Oral QHS   multivitamin with minerals  1 tablet Oral Daily  predniSONE  5 mg Oral Q breakfast   rosuvastatin  20 mg Oral Daily   cyanocobalamin  1,000 mcg Oral Daily   Continuous Infusions:   ceFAZolin (ANCEF) IV     lactated ringers 75 mL/hr at 03/12/21 1148   sodium chloride       Anti-infectives (From admission, onward)    Start     Dose/Rate Route Frequency Ordered Stop   03/12/21 0700  ceFAZolin (ANCEF) IVPB 2g/100 mL premix        2 g 200 mL/hr over 30 Minutes  Intravenous  Once 03/10/21 1526     03/05/21 1000  valACYclovir (VALTREX) tablet 1,000 mg  Status:  Discontinued        1,000 mg Oral Daily 03/04/21 2305 03/05/21 0804   03/05/21 0800  cefTRIAXone (ROCEPHIN) 1 g in sodium chloride 0.9 % 100 mL IVPB  Status:  Discontinued        1 g 200 mL/hr over 30 Minutes Intravenous Every 24 hours 03/05/21 0612 03/05/21 1311              Family Communication/Anticipated D/C date and plan/Code Status   DVT prophylaxis: SCDs Start: 03/04/21 2240     Code Status: Full Code  Family Communication: Wife at the bedside Disposition Plan:    Status is: Inpatient  Remains inpatient appropriate because:Inpatient level of care appropriate due to severity of illness  Dispo:  Patient From: Home  Planned Disposition: Home  Medically stable for discharge: No             Subjective:   Interval events noted.  He has no complaints.  Objective:    Vitals:   03/11/21 2042 03/12/21 0106 03/12/21 0448 03/12/21 0718  BP: 115/81 108/70 119/73 119/74  Pulse: 69 76 74 70  Resp: '17 17 17   '$ Temp: 99.3 F (37.4 C) 98.9 F (37.2 C) 98.5 F (36.9 C) 98 F (36.7 C)  TempSrc: Oral Oral Oral Oral  SpO2: 99% 99% 99% 98%  Weight:  66 kg    Height:       No data found.   Intake/Output Summary (Last 24 hours) at 03/12/2021 1439 Last data filed at 03/12/2021 0803 Gross per 24 hour  Intake 359 ml  Output 850 ml  Net -491 ml   Filed Weights   03/10/21 0327 03/11/21 0416 03/12/21 0106  Weight: 65.4 kg 64.3 kg 66 kg    Exam:  GEN: NAD SKIN: Stage I coccygeal decubitus ulcer EYES: No pallor or icterus ENT: MMM CV: RRR PULM: CTA B ABD: soft, ND, NT, +BS CNS: AAO x 3, non focal EXT: No edema or tenderness      Pressure Injury 03/05/21 Coccyx Medial Stage 1 -  Intact skin with non-blanchable redness of a localized area usually over a bony prominence. (Active)  03/05/21 1035  Location: Coccyx  Location Orientation: Medial   Staging: Stage 1 -  Intact skin with non-blanchable redness of a localized area usually over a bony prominence.  Wound Description (Comments):   Present on Admission: Yes     Data Reviewed:   I have personally reviewed following labs and imaging studies:  Labs: Labs show the following:   Basic Metabolic Panel: Recent Labs  Lab 03/06/21 0258 03/07/21 0623 03/08/21 0304 03/09/21 0215 03/10/21 0339 03/11/21 0240 03/12/21 0000  NA 137 137 132* 135 135 135 134*  K 4.7 4.7 5.1 4.9 4.4 4.3 4.5  CL 107 110 107 106 101 101 99  CO2 20*  17* 15* 17* 21* 22 21*  GLUCOSE 66* 100* 160* 74 118* 131* 187*  BUN 75* 62* 62* 59* 57* 55* 53*  CREATININE 1.77* 1.69* 1.75* 1.77* 1.87* 1.96* 1.94*  CALCIUM 9.3 9.0 8.8* 8.9 9.1 9.2 8.9  MG  --  1.7 1.8 1.8 1.9 1.8  --   PHOS 3.0 3.5  --   --   --   --   --    GFR Estimated Creatinine Clearance: 35.4 mL/min (A) (by C-G formula based on SCr of 1.94 mg/dL (H)). Liver Function Tests: Recent Labs  Lab 03/06/21 0258 03/09/21 0215 03/11/21 0240  AST 16 47* 54*  ALT 19 33 64*  ALKPHOS 174* 280* 313*  BILITOT 1.0 0.8 1.0  PROT 6.6 6.3* 6.5  ALBUMIN 1.7* 1.8* 1.9*   No results for input(s): LIPASE, AMYLASE in the last 168 hours. Recent Labs  Lab 03/07/21 0623  AMMONIA 29   Coagulation profile Recent Labs  Lab 03/12/21 0000  INR 1.7*    CBC: Recent Labs  Lab 03/07/21 0623 03/09/21 0215 03/10/21 0339 03/11/21 0240 03/12/21 0000  WBC 14.8* 15.9* 16.6* 17.6* 16.7*  NEUTROABS 12.3* 13.1* 13.6* 14.4*  --   HGB 8.4* 8.5* 9.3* 8.7* 9.0*  HCT 26.9* 27.5* 30.1* 29.2* 30.2*  MCV 85.1 86.2 86.0 86.4 87.3  PLT 333 321 381 389 348   Cardiac Enzymes: No results for input(s): CKTOTAL, CKMB, CKMBINDEX, TROPONINI in the last 168 hours.  BNP (last 3 results) No results for input(s): PROBNP in the last 8760 hours. CBG: Recent Labs  Lab 03/11/21 2035 03/12/21 0020 03/12/21 0421 03/12/21 0814 03/12/21 1125  GLUCAP 290* 187* 113*  100* 107*   D-Dimer: No results for input(s): DDIMER in the last 72 hours. Hgb A1c: No results for input(s): HGBA1C in the last 72 hours. Lipid Profile: No results for input(s): CHOL, HDL, LDLCALC, TRIG, CHOLHDL, LDLDIRECT in the last 72 hours. Thyroid function studies: No results for input(s): TSH, T4TOTAL, T3FREE, THYROIDAB in the last 72 hours.  Invalid input(s): FREET3 Anemia work up: No results for input(s): VITAMINB12, FOLATE, FERRITIN, TIBC, IRON, RETICCTPCT in the last 72 hours. Sepsis Labs: Recent Labs  Lab 03/09/21 0215 03/10/21 0339 03/11/21 0240 03/12/21 0000  WBC 15.9* 16.6* 17.6* 16.7*    Microbiology Recent Results (from the past 240 hour(s))  Blood culture (routine x 2)     Status: None   Collection Time: 03/04/21  9:00 PM   Specimen: BLOOD  Result Value Ref Range Status   Specimen Description BLOOD LEFT ANTECUBITAL  Final   Special Requests   Final    BOTTLES DRAWN AEROBIC AND ANAEROBIC Blood Culture adequate volume   Culture   Final    NO GROWTH 5 DAYS Performed at [facility], [address], [place], [place] [address]    Report Status 03/09/2021 FINAL  Final  Blood culture (routine x 2)     Status: None   Collection Time: 03/04/21  9:01 PM   Specimen: BLOOD RIGHT FOREARM  Result Value Ref Range Status   Specimen Description BLOOD RIGHT FOREARM  Final   Special Requests   Final    BOTTLES DRAWN AEROBIC AND ANAEROBIC Blood Culture adequate volume   Culture   Final    NO GROWTH 5 DAYS Performed at [facility], [place] [address] [place], [place] [address]    Report Status 03/09/2021 FINAL  Final  Resp Panel by RT-PCR (Flu A&B, Covid) Nasopharyngeal Swab  Status: None   Collection Time: 03/04/21 11:08 PM   Specimen: Nasopharyngeal Swab; Nasopharyngeal(NP) swabs in vial transport medium  Result Value Ref Range Status   SARS Coronavirus 2 by RT PCR NEGATIVE NEGATIVE Final    Comment: (NOTE) SARS-CoV-2 target nucleic acids are NOT  DETECTED.  The SARS-CoV-2 RNA is generally detectable in upper respiratory specimens during the acute phase of infection. The lowest concentration of SARS-CoV-2 viral copies this assay can detect is 138 copies/mL. A negative result does not preclude SARS-Cov-2 infection and should not be used as the sole basis for treatment or other patient management decisions. A negative result may occur with  improper specimen collection/handling, submission of specimen other than nasopharyngeal swab, presence of viral mutation(s) within the areas targeted by this assay, and inadequate number of viral copies(<138 copies/mL). A negative result must be combined with clinical observations, patient history, and epidemiological information. The expected result is Negative.  Fact Sheet for Patients:  EntrepreneurPulse.com.au  Fact Sheet for Healthcare Providers:  IncredibleEmployment.be  This test is no t yet approved or cleared by the [place] FDA and  has been authorized for detection and/or diagnosis of SARS-CoV-2 by FDA under an Emergency Use Authorization (EUA). This EUA will remain  in effect (meaning this test can be used) for the duration of the COVID-19 declaration under Section 564(b)(1) of the Act, 21 U.S.C.section 360bbb-3(b)(1), unless the authorization is terminated  or revoked sooner.       Influenza A by PCR NEGATIVE NEGATIVE Final   Influenza B by PCR NEGATIVE NEGATIVE Final    Comment: (NOTE) The Xpert Xpress SARS-CoV-2/FLU/RSV plus assay is intended as an aid in the diagnosis of influenza from Nasopharyngeal swab specimens and should not be used as a sole basis for treatment. Nasal washings and aspirates are unacceptable for Xpert Xpress SARS-CoV-2/FLU/RSV testing.  Fact Sheet for Patients: EntrepreneurPulse.com.au  Fact Sheet for Healthcare Providers: IncredibleEmployment.be  This test is not yet  approved or cleared by the [place] FDA and has been authorized for detection and/or diagnosis of SARS-CoV-2 by FDA under an Emergency Use Authorization (EUA). This EUA will remain in effect (meaning this test can be used) for the duration of the COVID-19 declaration under Section 564(b)(1) of the Act, 21 U.S.C. section 360bbb-3(b)(1), unless the authorization is terminated or revoked.  Performed at [facility], [place] [address] [place], [place] [address]   Urine Culture     Status: Abnormal   Collection Time: 03/05/21  8:06 PM   Specimen: Urine, Clean Catch  Result Value Ref Range Status   Specimen Description URINE, CLEAN CATCH  Final   Special Requests   Final    NONE Performed at [facility], [place] [address], [place], [place] [address]    Culture MULTIPLE SPECIES PRESENT, SUGGEST RECOLLECTION (A)  Final   Report Status 03/08/2021 FINAL  Final    Procedures and diagnostic studies:  No results found.             LOS: 7 days   Leyli Kevorkian  [facility]   Pager on [email or web address]. If 7PM-7AM, please contact night-coverage at [email or web address]     03/12/2021, 2:39 PM

## 2021-03-12 NOTE — Progress Notes (Signed)
Occupational Therapy Treatment Patient Details Name: Larry Garner MRN: [id number] DOB: 02/15/1956 Today's Date: 03/12/2021    History of present illness Pt is a 65 y/o male admitted 7/27 secondary to HHS in the setting of diet noncompliance and dehydration from poor oral intake and DKA. PMH includes CVA with R sided and cognitive deficits, CHF, DM, HTN, and renal cancer.   OT comments  Pt received in bed and eager for OOB activities. Pt with improved mobility to/from bathroom using RW at min guard though benefits from DME safety cues. Pt with confusion noted, requiring consistent cues for sequencing ADLs properly though able to complete with limited physical assist. Pt left in chair, alarm active and awaiting breakfast. Encouraged completion of UE HEP later with wife. DC recs remain appropriate.    Follow Up Recommendations  Home health OT;Supervision/Assistance - 24 hour [facility] aide)    Equipment Recommendations  3 in 1 bedside commode;Wheelchair (measurements OT);Wheelchair cushion (measurements OT);Hospital bed    Recommendations for Other Services      Precautions / Restrictions Precautions Precautions: Fall Restrictions Weight Bearing Restrictions: No       Mobility Bed Mobility Overal bed mobility: Needs Assistance Bed Mobility: Supine to Sit     Supine to sit: Min assist     General bed mobility comments: Min A to lift trunk    Transfers Overall transfer level: Needs assistance Equipment used: Rolling walker (2 wheeled) Transfers: Sit to/from Stand Sit to Stand: Min guard         General transfer comment: min guard with increased time/effort    Balance Overall balance assessment: Needs assistance Sitting-balance support: No upper extremity supported;Feet supported Sitting balance-Leahy Scale: Good     Standing balance support: Single extremity supported;Bilateral upper extremity supported Standing balance-Leahy Scale: Poor Standing balance comment:  reliant on UE support of walker                           ADL either performed or assessed with clinical judgement   ADL Overall ADL's : Needs assistance/impaired     Grooming: Supervision/safety;Standing;Wash/dry face Grooming Details (indicate cue type and reason): cues for completion of task     Lower Body Bathing: Sit to/from stand;Minimal assistance Lower Body Bathing Details (indicate cue type and reason): min guard to bathe peri region with cues to attend to task, min a for clothing mgmt Upper Body Dressing : Set up;Sitting Upper Body Dressing Details (indicate cue type and reason): able to don new gown without assist Lower Body Dressing: Sit to/from stand;Minimal assistance Lower Body Dressing Details (indicate cue type and reason): assist to doff soiled socks but able to don clean socks sitting EOB without assist Toilet Transfer: Min guard;Ambulation;Regular Toilet;RW Armed forces technical officer Details (indicate cue type and reason): cues for RW use as pt tending to leave behind         Functional mobility during ADLs: Min guard;Rolling walker General ADL Comments: Improving mobility with RW though continues to require consistent cues for DME safety and sequencing ADLs due to confusion     Vision   Vision Assessment?: No apparent visual deficits   Perception     Praxis      Cognition Arousal/Alertness: Awake/alert Behavior During Therapy: WFL for tasks assessed/performed;Impulsive Overall Cognitive Status: Impaired/Different from baseline Area of Impairment: Attention;Memory;Following commands;Safety/judgement;Awareness;Problem solving                   Current Attention Level: Selective Memory: Decreased  short-term memory Following Commands: Follows one step commands consistently;Follows multi-step commands inconsistently Safety/Judgement: Decreased awareness of safety;Decreased awareness of deficits Awareness: Emergent Problem Solving: Slow  processing;Decreased initiation;Difficulty sequencing General Comments: Pt with some impulsivity and confusion noted, often leaving RW behind with poor awareness of condom cath (attempted to urinate standing at toilet)        Exercises     Shoulder Instructions       General Comments      Pertinent Vitals/ Pain       Pain Assessment: No/denies pain Pain Intervention(s): Monitored during session  Home Living                                          Prior Functioning/Environment              Frequency  Min 2X/week        Progress Toward Goals  OT Goals(current goals can now be found in the care plan section)  Progress towards OT goals: Progressing toward goals  Acute Rehab OT Goals Patient Stated Goal: to go home OT Goal Formulation: With patient/family Time For Goal Achievement: 03/21/21 Potential to Achieve Goals: Good ADL Goals Pt Will Transfer to Toilet: with min assist;ambulating;bedside commode Pt/caregiver will Perform Home Exercise Program: Increased strength;Both right and left upper extremity;With theraband;With Supervision Additional ADL Goal #1: pt/caregiver will verbalize 3 strategies to reduce risk of falls  Plan Discharge plan remains appropriate    Co-evaluation                 AM-PAC OT "6 Clicks" Daily Activity     Outcome Measure   Help from another person eating meals?: A Little Help from another person taking care of personal grooming?: A Little Help from another person toileting, which includes using toliet, bedpan, or urinal?: A Lot Help from another person bathing (including washing, rinsing, drying)?: A Little Help from another person to put on and taking off regular upper body clothing?: A Little Help from another person to put on and taking off regular lower body clothing?: A Lot 6 Click Score: 16    End of Session Equipment Utilized During Treatment: Gait belt;Rolling walker  OT Visit Diagnosis:  Unsteadiness on feet (R26.81);Other abnormalities of gait and mobility (R26.89);Muscle weakness (generalized) (M62.81);Other symptoms and signs involving cognitive function;Pain   Activity Tolerance Patient tolerated treatment well   Patient Left in chair;with call bell/phone within reach;with chair alarm set   Nurse Communication Mobility status;Other (comment) (condom cath malfunction)        Time: [phone] OT Time Calculation (min): 23 min  Charges: OT General Charges $OT Visit: 1 Visit OT Treatments $Self Care/Home Management : 8-22 mins $Therapeutic Activity: 8-22 mins  Malachy Chamber, OTR/L Acute Rehab Services Office: [phone]    Layla Maw 03/12/2021, 8:37 AM

## 2021-03-12 NOTE — Care Management Important Message (Signed)
Important Message  Patient Details  Name: Larry Garner MRN: [id number] Date of Birth: 02-24-1956   Medicare Important Message Given:  Yes     Shelda Altes 03/12/2021, 10:19 AM

## 2021-03-13 DIAGNOSIS — E1165 Type 2 diabetes mellitus with hyperglycemia: Secondary | ICD-10-CM | POA: Diagnosis not present

## 2021-03-13 DIAGNOSIS — N179 Acute kidney failure, unspecified: Secondary | ICD-10-CM | POA: Diagnosis not present

## 2021-03-13 DIAGNOSIS — E1100 Type 2 diabetes mellitus with hyperosmolarity without nonketotic hyperglycemic-hyperosmolar coma (NKHHC): Secondary | ICD-10-CM | POA: Diagnosis not present

## 2021-03-13 LAB — CBC WITH DIFFERENTIAL/PLATELET
Abs Immature Granulocytes: 0.13 10*3/uL — ABNORMAL HIGH (ref 0.00–0.07)
Basophils Absolute: 0 10*3/uL (ref 0.0–0.1)
Basophils Relative: 0 %
Eosinophils Absolute: 0.1 10*3/uL (ref 0.0–0.5)
Eosinophils Relative: 0 %
HCT: 29.9 % — ABNORMAL LOW (ref 39.0–52.0)
Hemoglobin: 9.1 g/dL — ABNORMAL LOW (ref 13.0–17.0)
Immature Granulocytes: 1 %
Lymphocytes Relative: 9 %
Lymphs Abs: 1.5 10*3/uL (ref 0.7–4.0)
MCH: 26.4 pg (ref 26.0–34.0)
MCHC: 30.4 g/dL (ref 30.0–36.0)
MCV: 86.7 fL (ref 80.0–100.0)
Monocytes Absolute: 1.7 10*3/uL — ABNORMAL HIGH (ref 0.1–1.0)
Monocytes Relative: 10 %
Neutro Abs: 13.7 10*3/uL — ABNORMAL HIGH (ref 1.7–7.7)
Neutrophils Relative %: 80 %
Platelets: 397 10*3/uL (ref 150–400)
RBC: 3.45 MIL/uL — ABNORMAL LOW (ref 4.22–5.81)
RDW: 18.6 % — ABNORMAL HIGH (ref 11.5–15.5)
WBC: 17.1 10*3/uL — ABNORMAL HIGH (ref 4.0–10.5)
nRBC: 0 % (ref 0.0–0.2)

## 2021-03-13 LAB — URINALYSIS, ROUTINE W REFLEX MICROSCOPIC
Bilirubin Urine: NEGATIVE
Glucose, UA: 150 mg/dL — AB
Hgb urine dipstick: NEGATIVE
Ketones, ur: NEGATIVE mg/dL
Leukocytes,Ua: NEGATIVE
Nitrite: NEGATIVE
Protein, ur: 30 mg/dL — AB
Specific Gravity, Urine: 1.015 (ref 1.005–1.030)
pH: 5 (ref 5.0–8.0)

## 2021-03-13 LAB — BASIC METABOLIC PANEL
Anion gap: 10 (ref 5–15)
BUN: 50 mg/dL — ABNORMAL HIGH (ref 8–23)
CO2: 22 mmol/L (ref 22–32)
Calcium: 8.7 mg/dL — ABNORMAL LOW (ref 8.9–10.3)
Chloride: 100 mmol/L (ref 98–111)
Creatinine, Ser: 2.04 mg/dL — ABNORMAL HIGH (ref 0.61–1.24)
GFR, Estimated: 36 mL/min — ABNORMAL LOW (ref >60)
Glucose, Bld: 168 mg/dL — ABNORMAL HIGH (ref 70–99)
Potassium: 4.5 mmol/L (ref 3.5–5.1)
Sodium: 132 mmol/L — ABNORMAL LOW (ref 135–145)

## 2021-03-13 LAB — GLUCOSE, CAPILLARY
Glucose-Capillary: 127 mg/dL — ABNORMAL HIGH (ref 70–99)
Glucose-Capillary: 147 mg/dL — ABNORMAL HIGH (ref 70–99)
Glucose-Capillary: 224 mg/dL — ABNORMAL HIGH (ref 70–99)
Glucose-Capillary: 253 mg/dL — ABNORMAL HIGH (ref 70–99)
Glucose-Capillary: 277 mg/dL — ABNORMAL HIGH (ref 70–99)
Glucose-Capillary: 370 mg/dL — ABNORMAL HIGH (ref 70–99)

## 2021-03-13 MED ORDER — ALBUMIN HUMAN 25 % IV SOLN
50.0000 g | Freq: Once | INTRAVENOUS | Status: AC
  Administered 2021-03-13: 50 g via INTRAVENOUS
  Filled 2021-03-13: qty 200

## 2021-03-13 NOTE — Consult Note (Signed)
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) who presented to the emergency room with decreased oral intake/weight loss and changes in mental status on 03/04/2021.  At that time, work-up showed evidence of hyperosmolar hyperglycemic state with acute kidney injury, hyperkalemia and non-anion gap metabolic acidosis.  He was treated with intravenous fluids and insulin with creatinine at admission of 2.1 improving to 1.7 about 6 days ago following which it has gradually been rising now up to 2.0.  He has not had any exposure to iodinated intravenous contrast and has not had any critically low blood pressures.  Medications reviewed do not reveal any common nephrotoxins.  He denies any prior history of kidney injury, recurrent UTIs or recurrent kidney stones.  He does not have any strong family history of renal disease.  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  There was a 10 cm left renal mass with left periaortic developing adenopathy and bilateral pleural effusions.  Past Medical History:  Diagnosis Date   Allergic rhinitis    Allergy    Anxiety    Asthma    CHF (congestive heart failure) ([place])    Diabetes ([place])    Diverticulitis    Dyspnea    GERD (gastroesophageal reflux disease)    very occ   History of heart attack    HLD (hyperlipidemia)    HTN (hypertension)    Myocardial infarction ([place]) 2000   Sleep apnea    wears cpap    Tubular adenoma of colon  09/2008    Past Surgical History:  Procedure Laterality Date   CARDIAC CATHETERIZATION N/A 07/20/2016   Procedure: Left Heart Cath and Cors/Grafts Angiography;  Surgeon: Belva Crome, MD;  Location: [place] [facility];  Service: Cardiovascular;  Laterality: N/A;   CATARACT EXTRACTION Bilateral 06/06/2019   Dr. Tommy Rainwater. oct left, dec right 2020    COLONOSCOPY     CORONARY ANGIOPLASTY     CORONARY ARTERY BYPASS GRAFT  06/1999   POLYPECTOMY     RENAL BIOPSY  06/2020   TONSILLECTOMY     late50's early 39's    Family History  Problem Relation Age of Onset   Hypertension Mother    Alzheimer's disease Mother    Colon polyps Mother    Hyperlipidemia Father    Hypertension Father    Colon polyps Father    Colon cancer Paternal Aunt    Pancreatic cancer Neg Hx    Stomach cancer Neg Hx    Thyroid disease Neg Hx    Esophageal cancer Neg Hx    Rectal cancer Neg Hx     Social History:  reports that he quit smoking about 43 years ago. His smoking use included cigarettes. He has a 2.00 pack-year smoking history. He has never used smokeless tobacco. He reports previous alcohol use. He reports that he does not use drugs.  Allergies:  Allergies  Allergen Reactions   Morphine Sulfate Nausea And Vomiting and Swelling    Medications: I have reviewed the patient's current medications. Scheduled:  bisoprolol  10 mg Oral Daily  feeding supplement  237 mL Oral TID BM   folic acid  1 mg Oral Daily   insulin aspart  0-5 Units Subcutaneous QHS   insulin aspart  0-9 Units Subcutaneous TID WC   mirtazapine  15 mg Oral QHS   multivitamin with minerals  1 tablet Oral Daily   predniSONE  5 mg Oral Q breakfast   rosuvastatin  20 mg Oral Daily   cyanocobalamin  1,000 mcg Oral Daily    BMP Latest Ref Rng & Units 03/13/2021 03/12/2021 03/11/2021  Glucose 70 - 99 mg/dL 168(H) 187(H) 131(H)  BUN 8 - 23 mg/dL 50(H) 53(H) 55(H)  Creatinine 0.61 - 1.24 mg/dL 2.04(H) 1.94(H) 1.96(H)  BUN/Creat Ratio 10 -  24 - - -  Sodium 135 - 145 mmol/L 132(L) 134(L) 135  Potassium 3.5 - 5.1 mmol/L 4.5 4.5 4.3  Chloride 98 - 111 mmol/L 100 99 101  CO2 22 - 32 mmol/L 22 21(L) 22  Calcium 8.9 - 10.3 mg/dL 8.7(L) 8.9 9.2   CBC Latest Ref Rng & Units 03/13/2021 03/12/2021 03/11/2021  WBC 4.0 - 10.5 K/uL 17.1(H) 16.7(H) 17.6(H)  Hemoglobin 13.0 - 17.0 g/dL 9.1(L) 9.0(L) 8.7(L)  Hematocrit 39.0 - 52.0 % 29.9(L) 30.2(L) 29.2(L)  Platelets 150 - 400 K/uL 397 348 389    No results found.  Review of Systems  Reason unable to perform ROS: Aphasia-pertinent positives reported by wife in HPI.  Blood pressure 112/70, pulse 67, temperature 98.4 F (36.9 C), temperature source Oral, resp. rate 16, height '5\' 11"'$  (1.803 m), weight 65.7 kg, SpO2 99 %. Physical Exam Vitals reviewed.  Constitutional:      General: He is not in acute distress.    Appearance: Normal appearance. He is ill-appearing.     Comments: Cachectic appearing, sitting comfortably in recliner  HENT:     Head: Normocephalic and atraumatic.     Right Ear: External ear normal.     Left Ear: External ear normal.     Nose: Nose normal.     Mouth/Throat:     Mouth: Mucous membranes are dry.     Pharynx: Oropharynx is clear.  Eyes:     General: No scleral icterus.    Extraocular Movements: Extraocular movements intact.     Conjunctiva/sclera: Conjunctivae normal.  Cardiovascular:     Rate and Rhythm: Normal rate and regular rhythm.     Pulses: Normal pulses.     Heart sounds: Normal heart sounds.    No gallop.  Pulmonary:     Effort: Pulmonary effort is normal.     Breath sounds: Normal breath sounds. No wheezing or rales.  Abdominal:     General: Abdomen is flat.     Palpations: Abdomen is soft.     Tenderness: There is no abdominal tenderness.  Musculoskeletal:        General: Normal range of motion.     Cervical back: Normal range of motion and neck supple.     Right lower leg: No edema.     Left lower leg: No edema.  Skin:    General:  Skin is warm and dry.     Coloration: Skin is pale.  Neurological:     Mental Status: He is alert. Mental status is at baseline.     Comments: Aphasia    Assessment/Plan: 1.  Acute kidney injury: It appears that the initial phase of this injury might have been hemodynamically mediated and based on the current data, suspect that this might have evolved  into ischemic ATN.  Abdominal CT findings raise some concern for intra-abdominal hypertension/compression effect to renal veins that may further compound renal injury.  He is nonoliguric and does not have any acute indications for dialysis at this time.  I will send off for urinalysis and urine electrolytes and with exam/data continuing to support hemodynamically mediated injury-will give him a trial of albumin intravenously to try and see if I can improve volume expansion/renal perfusion and renal function. Avoid nephrotoxic medications including NSAIDs and iodinated intravenous contrast exposure unless the latter is absolutely indicated.  Preferred narcotic agents for pain control are hydromorphone, fentanyl, and methadone. Morphine should not be used. Avoid Baclofen and avoid oral sodium phosphate and magnesium citrate based laxatives / bowel preps. Continue strict Input and Output monitoring. Will monitor the patient closely with you and intervene or adjust therapy as indicated by changes in clinical status/labs.  Agree with holding furosemide at this time. 2.  Hyponatremia: Mild and secondary to acute kidney injury and impaired free water handling-will give volume expansion and monitor renal function/electrolytes. 3.  Left renal cell carcinoma: Ongoing close surveillance with Keytruda based chemotherapy at [facility] with plans in the future for nephrectomy when he is more ideal physically for surgery. 4.  Anemia of chronic disease/malignancy: Some improvement of hemoglobin and hematocrit noted, monitor with volume expansion anticipating  some dilutional effect that may drive H/H down.  No indications for PRBC and will avoid ESA at this time with active malignancy. 5.  Hyperosmolar hyperglycemic state: Improving glycemic control and basal insulin discontinued, now on SSI. 6.  Protein calorie malnutrition/deconditioning: Appears that he was being worked up for PEG tube placement however, abdominal CT findings have put this on hold for now. 7.  History of combined systolic/diastolic heart failure: Entresto, spironolactone and furosemide currently on hold due to acute kidney injury.  Attempting gentle intravascular volume expansion.  Larry Garner K. 03/13/2021, 2:46 PM

## 2021-03-13 NOTE — TOC Progression Note (Signed)
Transition of Care [facility]) - Progression Note    Patient Details  Name: Larry Garner MRN: [id number] Date of Birth: 04-20-1956  Transition of Care [facility]) CM/SW Contact  Zenon Mayo, RN Phone Number: 03/13/2021, 9:23 AM  Clinical Narrative:    NCM spoke with patient and wife at bedside, offered choice, they do not have a preference. NCM made referral to [facility] with Alvis Lemmings for HHPT, he is able to take referral.  Wife states they have a walker, cane and shower chair and a belt at home and somone is coming to put grab rails in the home also.  He can ride in car for transport.  She states they would like a hospital bed and the bed will need to be there before he can discharge and she will be the contact for delivery. The hospital bed has been delivered to the home per wife.  Wife states she would like for [facility] soc to be on 8/14, NCM informed Tommi Rumps and he states that's fine.           Expected Discharge Plan and Services                                                 Social Determinants of Health (SDOH) Interventions    Readmission Risk Interventions No flowsheet data found.

## 2021-03-13 NOTE — Plan of Care (Signed)
  Problem: Pain Managment: Goal: General experience of comfort will improve Outcome: Completed/Met

## 2021-03-13 NOTE — Progress Notes (Signed)
Inpatient Diabetes Program Recommendations  AACE/ADA: New Consensus Statement on Inpatient Glycemic Control   Target Ranges:  Prepandial:   less than 140 mg/dL      Peak postprandial:   less than 180 mg/dL (1-2 hours)      Critically ill patients:  140 - 180 mg/dL   Results for JONUS, RIVENBARK (MRN [id number]) as of 03/13/2021 10:33  Ref. Range 03/12/2021 08:14 03/12/2021 11:25 03/12/2021 15:32 03/12/2021 20:32 03/13/2021 00:05 03/13/2021 04:02 03/13/2021 07:12  Glucose-Capillary Latest Ref Range: 70 - 99 mg/dL 100 (H) 107 (H) 234 (H) 315 (H) 224 (H) 147 (H) 127 (H)    Review of Glycemic Control   Current orders for Inpatient glycemic control: Novolog 0-9 units TID, Novolog 0-5 units QHS; Prednisone 5 mg daily   Inpatient Diabetes Program Recommendations:     Insulin: Post prandial glucose is consistently elevated. If steroids are continued, please consider ordering Novolog 3 units TID with meals for meal coverage if patient eats at least 50% of meals.   Thanks, Barnie Alderman, RN, MSN, CDE Diabetes Coordinator Inpatient Diabetes Program [phone] (Team Pager from 8am to 5pm)

## 2021-03-13 NOTE — TOC Transition Note (Signed)
Transition of Care [facility]) - CM/SW Discharge Note   Patient Details  Name: Larry Garner MRN: [id number] Date of Birth: 18-Mar-1956  Transition of Care [facility]) CM/SW Contact:  Zenon Mayo, RN Phone Number: 03/13/2021, 1:21 PM   Clinical Narrative:    NCM spoke with patient and wife at bedside, offered choice, they do not have a preference. NCM made referral to [facility] with Alvis Lemmings for HHPT, he is able to take referral.  Wife states they have a walker, cane and shower chair and a belt at home and somone is coming to put grab rails in the home also.  He can ride in car for transport.  She states they would like a hospital bed and the bed will need to be there before he can discharge and she will be the contact for delivery. The hospital bed has been delivered to the home per wife.  Wife states she would like for [facility] soc to be on 8/14, NCM informed Tommi Rumps and he states that's fine.      Final next level of care: Home w Home Health Services Barriers to Discharge: No Barriers Identified   Patient Goals and CMS Choice Patient states their goals for this hospitalization and ongoing recovery are:: return home      Discharge Placement                       Discharge Plan and Services                DME Arranged: Hospital bed DME Agency: [employer] Date DME Agency Contacted: 03/04/21 Time DME Agency Contacted: 1000 Representative spoke with at DME Agency: [place]: PT [place]: [place] Date [place]: 03/13/21 Time [place]: 14 Representative spoke with at [place]: [place] ([place]) Interventions     Readmission Risk Interventions No flowsheet data found.

## 2021-03-13 NOTE — Progress Notes (Signed)
Progress Note    Larry Garner  [id number] DOB: 1956/01/22  DOA: 03/04/2021 PCP: Marin Olp, MD      Brief Narrative:    Medical records reviewed and are as summarized below:  Larry Garner is a 65 y.o. 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 fib on Eliquis, anxiety, who presented to the hospital for oral intake, dizziness and significant weight loss.  He was hyperglycemic with glucose of 664, bicarb 15, creatinine of 2, potassium of 5.6 and WBC of 14.2.  He was admitted to the hospital for hyperosmolar hyperglycemic state and AKI complicated by hyperkalemia and non-anion gap metabolic acidosis.      Assessment/Plan:   Principal Problem:   Hyperosmolar hyperglycemic state (HHS) ([place]) Active Problems:   Pressure injury of skin   AKI (acute kidney injury) ([place])   Nutrition Problem: Severe Malnutrition Etiology: chronic illness (renal cell carcinoma)  Signs/Symptoms: severe fat depletion, severe muscle depletion, percent weight loss Percent weight loss: 11.5 %   Body mass index is 20.2 kg/m.    Hyperosmolar hyperglycemic state, hypoglycemia: Overall, glucose levels have improved.  He developed hypoglycemia on 03/06/2021 so Lantus was discontinued.  Continue NovoLog as needed for hyperglycemia.  Monitor glucose levels closely.  Poor oral intake: Continue mirtazapine.  Cortrak tube was placed on 03/06/2021 but this was dislodged.  NG tube was placed on 03/07/2021.  This was also dislodged on 03/08/2021.    Plan for PEG tube placement has been aborted.  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  AKI: Creatinine is trending upward despite IV fluids.  Discontinue IV fluids.  Consulted nephrologist, Dr. Posey Pronto to assist with management.  Dr. Posey Pronto recommended IV albumin infusion. Hyperkalemia and metabolic acidosis have resolved.  Chronic systolic and diastolic CHF: Compensated.  2D echo in May 2020 showed EF estimated at 35 to [id number], grade 2 diastolic dysfunction.  Entresto, Aldactone and Lasix have been held because of AKI.  Left renal cell carcinoma: He is on low-dose prednisone for immunotherapy.  Outpatient follow-up with oncologist.  He was supposed to undergo surgery in December 2022 but this was canceled because of acute stroke.  Chronic leukocytosis: This may be due to steroids.  Monitor CBC.  Stage I medial coccygeal decubitus ulcer (present on admission): Apply Mepilex border  Debility: PT recommends home health therapy.  Other comorbidities include chronic atrial fibrillation, CAD s/p CABG, hyperlipidemia, anemia of chronic disease, history of stroke.    Diet Order             Diet regular Room service appropriate? Yes with Assist; Fluid consistency: Thin  Diet effective now                      Consultants: Interventional radiologist  Procedures: None    Medications:    bisoprolol  10 mg Oral Daily   feeding supplement  237 mL Oral TID BM   folic acid  1 mg Oral Daily   insulin aspart  0-5 Units Subcutaneous QHS   insulin aspart  0-9 Units Subcutaneous TID WC   mirtazapine  15 mg Oral QHS   multivitamin  with minerals  1 tablet Oral Daily   predniSONE  5 mg Oral Q breakfast   rosuvastatin  20 mg Oral Daily   cyanocobalamin  1,000 mcg Oral Daily   Continuous Infusions:  albumin human      ceFAZolin (ANCEF) IV     lactated ringers 75 mL/hr at 03/13/21 1031   sodium chloride       Anti-infectives (From admission, onward)    Start     Dose/Rate Route Frequency Ordered Stop   03/12/21 0700   ceFAZolin (ANCEF) IVPB 2g/100 mL premix        2 g 200 mL/hr over 30 Minutes Intravenous  Once 03/10/21 1526     03/05/21 1000  valACYclovir (VALTREX) tablet 1,000 mg  Status:  Discontinued        1,000 mg Oral Daily 03/04/21 2305 03/05/21 0804   03/05/21 0800  cefTRIAXone (ROCEPHIN) 1 g in sodium chloride 0.9 % 100 mL IVPB  Status:  Discontinued        1 g 200 mL/hr over 30 Minutes Intravenous Every 24 hours 03/05/21 0612 03/05/21 1311              Family Communication/Anticipated D/C date and plan/Code Status   DVT prophylaxis: SCDs Start: 03/04/21 2240     Code Status: Full Code  Family Communication: Wife at the bedside Disposition Plan:    Status is: Inpatient  Remains inpatient appropriate because:Inpatient level of care appropriate due to severity of illness  Dispo:  Patient From: Home  Planned Disposition: Home  Medically stable for discharge: No             Subjective:   Interval events noted.  Patient is a little confused today.  His wife was at the bedside she said that sometimes she has intermittent confusion and aphasia because of history of stroke  Objective:    Vitals:   03/12/21 1934 03/13/21 0602 03/13/21 0830 03/13/21 1139  BP: 111/66 116/70 106/71 112/70  Pulse: 70 74  67  Resp: '20 20  16  '$ Temp: 98.3 F (36.8 C) 99.8 F (37.7 C)  98.4 F (36.9 C)  TempSrc: Oral Oral  Oral  SpO2: 100% 98%  99%  Weight:  65.7 kg    Height:       No data found.   Intake/Output Summary (Last 24 hours) at 03/13/2021 1546 Last data filed at 03/13/2021 1545 Gross per 24 hour  Intake 3491.14 ml  Output 925 ml  Net 2566.14 ml   Filed Weights   03/11/21 0416 03/12/21 0106 03/13/21 0602  Weight: 64.3 kg 66 kg 65.7 kg    Exam:  GEN: NAD SKIN: Stage I coccygeal decubitus ulcer EYES: EOMI ENT: MMM CV: RRR PULM: CTA B ABD: soft, ND, NT, +BS CNS: AAO x 1 (person), non focal EXT: No edema or tenderness     Pressure Injury 03/05/21 Coccyx  Medial Stage 1 -  Intact skin with non-blanchable redness of a localized area usually over a bony prominence. (Active)  03/05/21 1035  Location: Coccyx  Location Orientation: Medial  Staging: Stage 1 -  Intact skin with non-blanchable redness of a localized area usually over a bony prominence.  Wound Description (Comments):   Present on Admission: Yes     Data Reviewed:   I have personally reviewed following labs and imaging studies:  Labs: Labs show the following:   Basic Metabolic Panel: Recent Labs  Lab 03/07/21 0623 03/08/21 0304 03/09/21 0215 03/10/21 0339 03/11/21 0240 03/12/21  0000 03/13/21 0308  NA 137 132* 135 135 135 134* 132*  K 4.7 5.1 4.9 4.4 4.3 4.5 4.5  CL 110 107 106 101 101 99 100  CO2 17* 15* 17* 21* 22 21* 22  GLUCOSE 100* 160* 74 118* 131* 187* 168*  BUN 62* 62* 59* 57* 55* 53* 50*  CREATININE 1.69* 1.75* 1.77* 1.87* 1.96* 1.94* 2.04*  CALCIUM 9.0 8.8* 8.9 9.1 9.2 8.9 8.7*  MG 1.7 1.8 1.8 1.9 1.8  --   --   PHOS 3.5  --   --   --   --   --   --    GFR Estimated Creatinine Clearance: 33.5 mL/min (A) (by C-G formula based on SCr of 2.04 mg/dL (H)). Liver Function Tests: Recent Labs  Lab 03/09/21 0215 03/11/21 0240  AST 47* 54*  ALT 33 64*  ALKPHOS 280* 313*  BILITOT 0.8 1.0  PROT 6.3* 6.5  ALBUMIN 1.8* 1.9*   No results for input(s): LIPASE, AMYLASE in the last 168 hours. Recent Labs  Lab 03/07/21 0623  AMMONIA 29   Coagulation profile Recent Labs  Lab 03/12/21 0000  INR 1.7*    CBC: Recent Labs  Lab 03/07/21 0623 03/09/21 0215 03/10/21 0339 03/11/21 0240 03/12/21 0000 03/13/21 0308  WBC 14.8* 15.9* 16.6* 17.6* 16.7* 17.1*  NEUTROABS 12.3* 13.1* 13.6* 14.4*  --  13.7*  HGB 8.4* 8.5* 9.3* 8.7* 9.0* 9.1*  HCT 26.9* 27.5* 30.1* 29.2* 30.2* 29.9*  MCV 85.1 86.2 86.0 86.4 87.3 86.7  PLT 333 321 381 389 348 397   Cardiac Enzymes: No results for input(s): CKTOTAL, CKMB, CKMBINDEX, TROPONINI in the last 168 hours.  BNP  (last 3 results) No results for input(s): PROBNP in the last 8760 hours. CBG: Recent Labs  Lab 03/12/21 2032 03/13/21 0005 03/13/21 0402 03/13/21 0712 03/13/21 1142  GLUCAP 315* 224* 147* 127* 253*   D-Dimer: No results for input(s): DDIMER in the last 72 hours. Hgb A1c: No results for input(s): HGBA1C in the last 72 hours. Lipid Profile: No results for input(s): CHOL, HDL, LDLCALC, TRIG, CHOLHDL, LDLDIRECT in the last 72 hours. Thyroid function studies: No results for input(s): TSH, T4TOTAL, T3FREE, THYROIDAB in the last 72 hours.  Invalid input(s): FREET3 Anemia work up: No results for input(s): VITAMINB12, FOLATE, FERRITIN, TIBC, IRON, RETICCTPCT in the last 72 hours. Sepsis Labs: Recent Labs  Lab 03/10/21 0339 03/11/21 0240 03/12/21 0000 03/13/21 0308  WBC 16.6* 17.6* 16.7* 17.1*    Microbiology Recent Results (from the past 240 hour(s))  Blood culture (routine x 2)     Status: None   Collection Time: 03/04/21  9:00 PM   Specimen: BLOOD  Result Value Ref Range Status   Specimen Description BLOOD LEFT ANTECUBITAL  Final   Special Requests   Final    BOTTLES DRAWN AEROBIC AND ANAEROBIC Blood Culture adequate volume   Culture   Final    NO GROWTH 5 DAYS Performed at [facility], [place] [address] [place], [place] [address]    Report Status 03/09/2021 FINAL  Final  Blood culture (routine x 2)     Status: None   Collection Time: 03/04/21  9:01 PM   Specimen: BLOOD RIGHT FOREARM  Result Value Ref Range Status   Specimen Description BLOOD RIGHT FOREARM  Final   Special Requests   Final    BOTTLES DRAWN AEROBIC AND ANAEROBIC Blood Culture adequate volume   Culture   Final    NO GROWTH 5 DAYS  Performed at [facility], [place] [address], [place], [place] [address]    Report Status 03/09/2021 FINAL  Final  Resp Panel by RT-PCR (Flu A&B, Covid) Nasopharyngeal Swab     Status: None   Collection Time: 03/04/21 11:08 PM   Specimen: Nasopharyngeal Swab;  Nasopharyngeal(NP) swabs in vial transport medium  Result Value Ref Range Status   SARS Coronavirus 2 by RT PCR NEGATIVE NEGATIVE Final    Comment: (NOTE) SARS-CoV-2 target nucleic acids are NOT DETECTED.  The SARS-CoV-2 RNA is generally detectable in upper respiratory specimens during the acute phase of infection. The lowest concentration of SARS-CoV-2 viral copies this assay can detect is 138 copies/mL. A negative result does not preclude SARS-Cov-2 infection and should not be used as the sole basis for treatment or other patient management decisions. A negative result may occur with  improper specimen collection/handling, submission of specimen other than nasopharyngeal swab, presence of viral mutation(s) within the areas targeted by this assay, and inadequate number of viral copies(<138 copies/mL). A negative result must be combined with clinical observations, patient history, and epidemiological information. The expected result is Negative.  Fact Sheet for Patients:  EntrepreneurPulse.com.au  Fact Sheet for Healthcare Providers:  IncredibleEmployment.be  This test is no t yet approved or cleared by the [place] FDA and  has been authorized for detection and/or diagnosis of SARS-CoV-2 by FDA under an Emergency Use Authorization (EUA). This EUA will remain  in effect (meaning this test can be used) for the duration of the COVID-19 declaration under Section 564(b)(1) of the Act, 21 U.S.C.section 360bbb-3(b)(1), unless the authorization is terminated  or revoked sooner.       Influenza A by PCR NEGATIVE NEGATIVE Final   Influenza B by PCR NEGATIVE NEGATIVE Final    Comment: (NOTE) The Xpert Xpress SARS-CoV-2/FLU/RSV plus assay is intended as an aid in the diagnosis of influenza from Nasopharyngeal swab specimens and should not be used as a sole basis for treatment. Nasal washings and aspirates are unacceptable for Xpert Xpress  SARS-CoV-2/FLU/RSV testing.  Fact Sheet for Patients: EntrepreneurPulse.com.au  Fact Sheet for Healthcare Providers: IncredibleEmployment.be  This test is not yet approved or cleared by the [place] FDA and has been authorized for detection and/or diagnosis of SARS-CoV-2 by FDA under an Emergency Use Authorization (EUA). This EUA will remain in effect (meaning this test can be used) for the duration of the COVID-19 declaration under Section 564(b)(1) of the Act, 21 U.S.C. section 360bbb-3(b)(1), unless the authorization is terminated or revoked.  Performed at [facility], [place] [address] [place], [place] [address]   Urine Culture     Status: Abnormal   Collection Time: 03/05/21  8:06 PM   Specimen: Urine, Clean Catch  Result Value Ref Range Status   Specimen Description URINE, CLEAN CATCH  Final   Special Requests   Final    NONE Performed at [facility], [place] [address], [place], [place] [address]    Culture MULTIPLE SPECIES PRESENT, SUGGEST RECOLLECTION (A)  Final   Report Status 03/08/2021 FINAL  Final    Procedures and diagnostic studies:  No results found.             LOS: 8 days   Maelys Kinnick  [facility]   Pager on [email or web address]. If 7PM-7AM, please contact night-coverage at [email or web address]     03/13/2021, 3:46 PM

## 2021-03-13 NOTE — Progress Notes (Signed)
Physical Therapy Treatment Patient Details Name: Larry Garner MRN: [id number] DOB: 12/04/1955 Today's Date: 03/13/2021    History of Present Illness Pt is a 65 y/o male admitted 7/27 secondary to HHS in the setting of diet noncompliance and dehydration from poor oral intake and DKA. PMH includes CVA with R sided and cognitive deficits, CHF, DM, HTN, and renal cancer.    PT Comments    Pt tolerates treatment well, ambulating without loss of balance. Pt does demonstrate some safety awareness deficits, abandoning walker when headed into bathroom despite PT cues to side step into bathroom. Pt also with cognitive deficits, choosing to walk into bathroom and stand at commode despite presence of condom catheter. Pt will benefit from continued acute PT POC to provide further dynamic gait challenges and stair training.   Follow Up Recommendations  Home health PT;Supervision/Assistance - 24 hour     Equipment Recommendations  Hospital bed    Recommendations for Other Services       Precautions / Restrictions Precautions Precautions: Fall Restrictions Weight Bearing Restrictions: No    Mobility  Bed Mobility Overal bed mobility: Needs Assistance Bed Mobility: Supine to Sit;Sit to Supine     Supine to sit: Supervision Sit to supine: Supervision   General bed mobility comments: verbal cues for technique    Transfers Overall transfer level: Needs assistance Equipment used: Rolling walker (2 wheeled) Transfers: Sit to/from Stand Sit to Stand: Min guard         General transfer comment: verbal cues for anterior trunk lean  Ambulation/Gait Ambulation/Gait assistance: Supervision Gait Distance (Feet): 250 Feet Assistive device: Rolling walker (2 wheeled) Gait Pattern/deviations: Step-through pattern Gait velocity: reduced Gait velocity interpretation: 1.31 - 2.62 ft/sec, indicative of limited community ambulator General Gait Details: pt with slowed step-through gait, cues for  direction of walker use in tight quarters   Stairs             Wheelchair Mobility    Modified Rankin (Stroke Patients Only)       Balance Overall balance assessment: Needs assistance Sitting-balance support: No upper extremity supported;Feet supported Sitting balance-Leahy Scale: Good     Standing balance support: No upper extremity supported Standing balance-Leahy Scale: Fair Standing balance comment: urinates in standing without loss of balance or UE support                            Cognition Arousal/Alertness: Awake/alert Behavior During Therapy: WFL for tasks assessed/performed;Impulsive Overall Cognitive Status: Impaired/Different from baseline Area of Impairment: Attention;Memory;Safety/judgement;Awareness;Problem solving                   Current Attention Level: Selective Memory: Decreased short-term memory Following Commands: Follows one step commands consistently Safety/Judgement: Decreased awareness of safety;Decreased awareness of deficits Awareness: Emergent Problem Solving: Slow processing        Exercises      General Comments General comments (skin integrity, edema, etc.): VSS on RA      Pertinent Vitals/Pain Pain Assessment: No/denies pain    Home Living                      Prior Function            PT Goals (current goals can now be found in the care plan section) Acute Rehab PT Goals Patient Stated Goal: to go home Progress towards PT goals: Progressing toward goals    Frequency    Min  3X/week      PT Plan Current plan remains appropriate    Co-evaluation              AM-PAC PT "6 Clicks" Mobility   Outcome Measure  Help needed turning from your back to your side while in a flat bed without using bedrails?: A Little Help needed moving from lying on your back to sitting on the side of a flat bed without using bedrails?: A Little Help needed moving to and from a bed to a chair  (including a wheelchair)?: A Little Help needed standing up from a chair using your arms (e.g., wheelchair or bedside chair)?: A Little Help needed to walk in hospital room?: A Little Help needed climbing 3-5 steps with a railing? : A Lot 6 Click Score: 17    End of Session   Activity Tolerance: Patient tolerated treatment well Patient left: in chair;with call bell/phone within reach;with chair alarm set;with family/visitor present Nurse Communication: Mobility status PT Visit Diagnosis: Unsteadiness on feet (R26.81);Muscle weakness (generalized) (M62.81)     Time: [id number] PT Time Calculation (min) (ACUTE ONLY): 20 min  Charges:  $Therapeutic Activity: 8-22 mins                     Zenaida Niece, PT, DPT Acute Rehabilitation Pager: [phone]    Zenaida Niece 03/13/2021, 2:58 PM

## 2021-03-13 NOTE — Plan of Care (Signed)

## 2021-03-14 DIAGNOSIS — N179 Acute kidney failure, unspecified: Secondary | ICD-10-CM | POA: Diagnosis not present

## 2021-03-14 DIAGNOSIS — E1100 Type 2 diabetes mellitus with hyperosmolarity without nonketotic hyperglycemic-hyperosmolar coma (NKHHC): Secondary | ICD-10-CM | POA: Diagnosis not present

## 2021-03-14 DIAGNOSIS — E1165 Type 2 diabetes mellitus with hyperglycemia: Secondary | ICD-10-CM | POA: Diagnosis not present

## 2021-03-14 LAB — RENAL FUNCTION PANEL
Albumin: 2.2 g/dL — ABNORMAL LOW (ref 3.5–5.0)
Anion gap: 9 (ref 5–15)
BUN: 49 mg/dL — ABNORMAL HIGH (ref 8–23)
CO2: 23 mmol/L (ref 22–32)
Calcium: 8.9 mg/dL (ref 8.9–10.3)
Chloride: 101 mmol/L (ref 98–111)
Creatinine, Ser: 1.99 mg/dL — ABNORMAL HIGH (ref 0.61–1.24)
GFR, Estimated: 37 mL/min — ABNORMAL LOW (ref >60)
Glucose, Bld: 134 mg/dL — ABNORMAL HIGH (ref 70–99)
Phosphorus: 3.1 mg/dL (ref 2.5–4.6)
Potassium: 4.5 mmol/L (ref 3.5–5.1)
Sodium: 133 mmol/L — ABNORMAL LOW (ref 135–145)

## 2021-03-14 LAB — GLUCOSE, CAPILLARY
Glucose-Capillary: 128 mg/dL — ABNORMAL HIGH (ref 70–99)
Glucose-Capillary: 140 mg/dL — ABNORMAL HIGH (ref 70–99)
Glucose-Capillary: 204 mg/dL — ABNORMAL HIGH (ref 70–99)
Glucose-Capillary: 209 mg/dL — ABNORMAL HIGH (ref 70–99)
Glucose-Capillary: 229 mg/dL — ABNORMAL HIGH (ref 70–99)
Glucose-Capillary: 298 mg/dL — ABNORMAL HIGH (ref 70–99)

## 2021-03-14 MED ORDER — ONDANSETRON HCL 4 MG/2ML IJ SOLN
4.0000 mg | Freq: Three times a day (TID) | INTRAMUSCULAR | Status: DC | PRN
  Administered 2021-03-14: 4 mg via INTRAVENOUS
  Filled 2021-03-14 (×2): qty 2

## 2021-03-14 MED ORDER — ALBUMIN HUMAN 25 % IV SOLN
50.0000 g | Freq: Once | INTRAVENOUS | Status: AC
  Administered 2021-03-14: 50 g via INTRAVENOUS
  Filled 2021-03-14: qty 150
  Filled 2021-03-14: qty 200

## 2021-03-14 NOTE — Progress Notes (Signed)
Progress Note    Larry Garner  [id number] DOB: 09-29-1955  DOA: 03/04/2021 PCP: Marin Olp, MD      Brief Narrative:    Medical records reviewed and are as summarized below:  Larry Garner is a 65 y.o. 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 fib on Eliquis, anxiety, who presented to the hospital for oral intake, dizziness and significant weight loss.  He was hyperglycemic with glucose of 664, bicarb 15, creatinine of 2, potassium of 5.6 and WBC of 14.2.  He was admitted to the hospital for hyperosmolar hyperglycemic state and AKI complicated by hyperkalemia and non-anion gap metabolic acidosis.      Assessment/Plan:   Principal Problem:   Hyperosmolar hyperglycemic state (HHS) ([place]) Active Problems:   Pressure injury of skin   AKI (acute kidney injury) ([place])   Nutrition Problem: Severe Malnutrition Etiology: chronic illness (renal cell carcinoma)  Signs/Symptoms: severe fat depletion, severe muscle depletion, percent weight loss Percent weight loss: 11.5 %   Body mass index is 21.43 kg/m.    Hyperosmolar hyperglycemic state, hypoglycemia: Glucose levels have been fluctuating.  He developed hypoglycemia on 03/06/2021 so Lantus was discontinued.  Continue NovoLog as needed for hyperglycemia.  Monitor glucose levels closely.  Poor oral intake: Continue mirtazapine.  Cortrak tube was placed on 03/06/2021 but this was dislodged.  NG tube was placed on 03/07/2021.  This was also dislodged on 03/08/2021.    Plan for PEG tube placement has been aborted.  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  AKI: No significant improvement in creatinine.  Case discussed with Dr. Posey Pronto, nephrologist, today.  He recommended IV albumin infusion needed with follow-up labs tomorrow.   Hyperkalemia and metabolic acidosis have resolved.  Chronic systolic and diastolic CHF: Compensated.  2D echo in May 2020 showed EF estimated at 35 to [id number], grade 2 diastolic dysfunction.  Entresto, Aldactone and Lasix have been held because of AKI.  Left renal cell carcinoma: He is on low-dose prednisone for immunotherapy.  Outpatient follow-up with oncologist.  He was supposed to undergo surgery in December 2022 but this was canceled because of acute stroke.  Chronic leukocytosis: This may be due to steroids.  Monitor CBC.  Stage I medial coccygeal decubitus ulcer (present on admission): Apply Mepilex border  Debility: PT recommends home health therapy.  Other comorbidities include chronic atrial fibrillation, CAD s/p CABG, hyperlipidemia, anemia of chronic disease, history of stroke.    Diet Order             Diet regular Room service appropriate? Yes with Assist; Fluid consistency: Thin  Diet effective now                      Consultants: Interventional radiologist  Procedures: None    Medications:    bisoprolol  10 mg Oral Daily   feeding supplement  237 mL Oral TID BM   folic acid  1 mg Oral Daily   insulin aspart  0-5 Units Subcutaneous QHS   insulin aspart  0-9 Units Subcutaneous TID WC   mirtazapine  15 mg Oral QHS   multivitamin with  minerals  1 tablet Oral Daily   predniSONE  5 mg Oral Q breakfast   rosuvastatin  20 mg Oral Daily   cyanocobalamin  1,000 mcg Oral Daily   Continuous Infusions:   ceFAZolin (ANCEF) IV     sodium chloride       Anti-infectives (From admission, onward)    Start     Dose/Rate Route Frequency Ordered Stop   03/12/21 0700  ceFAZolin (ANCEF) IVPB 2g/100 mL premix        2 g 200 mL/hr over 30  Minutes Intravenous  Once 03/10/21 1526     03/05/21 1000  valACYclovir (VALTREX) tablet 1,000 mg  Status:  Discontinued        1,000 mg Oral Daily 03/04/21 2305 03/05/21 0804   03/05/21 0800  cefTRIAXone (ROCEPHIN) 1 g in sodium chloride 0.9 % 100 mL IVPB  Status:  Discontinued        1 g 200 mL/hr over 30 Minutes Intravenous Every 24 hours 03/05/21 0612 03/05/21 1311              Family Communication/Anticipated D/C date and plan/Code Status   DVT prophylaxis: SCDs Start: 03/04/21 2240     Code Status: Full Code  Family Communication: None Disposition Plan:    Status is: Inpatient  Remains inpatient appropriate because:Inpatient level of care appropriate due to severity of illness  Dispo:  Patient From: Home  Planned Disposition: Home  Medically stable for discharge: No             Subjective:   Interval events noted.  He has no complaints.  He is asking when he is going to be discharged home.   Objective:    Vitals:   03/13/21 1139 03/13/21 2034 03/14/21 0014 03/14/21 0407  BP: 112/70 119/78  109/63  Pulse: 67 71  72  Resp: '16 16  17  '$ Temp: 98.4 F (36.9 C) 98.9 F (37.2 C)  98.8 F (37.1 C)  TempSrc: Oral Oral  Oral  SpO2: 99% 100%  98%  Weight:   69.7 kg   Height:       No data found.   Intake/Output Summary (Last 24 hours) at 03/14/2021 1340 Last data filed at 03/14/2021 0900 Gross per 24 hour  Intake 953.83 ml  Output 550 ml  Net 403.83 ml   Filed Weights   03/12/21 0106 03/13/21 0602 03/14/21 0014  Weight: 66 kg 65.7 kg 69.7 kg    Exam:  GEN: NAD SKIN: Stage I coccygeal decubitus ulcer EYES: No pallor or icterus ENT: MMM CV: RRR PULM: CTA B ABD: soft, ND, NT, +BS CNS: AAO x 3, non focal EXT: No edema or tenderness      Pressure Injury 03/05/21 Coccyx Medial Stage 1 -  Intact skin with non-blanchable redness of a localized area usually over a bony prominence. (Active)  03/05/21 1035  Location: Coccyx  Location  Orientation: Medial  Staging: Stage 1 -  Intact skin with non-blanchable redness of a localized area usually over a bony prominence.  Wound Description (Comments):   Present on Admission: Yes     Data Reviewed:   I have personally reviewed following labs and imaging studies:  Labs: Labs show the following:   Basic Metabolic Panel: Recent Labs  Lab 03/08/21 0304 03/09/21 0215 03/10/21 0339 03/11/21 0240 03/12/21 0000 03/13/21 0308 03/14/21 0331  NA 132* 135 135 135 134* 132* 133*  K 5.1 4.9 4.4 4.3 4.5 4.5 4.5  CL 107 106 101 101  99 100 101  CO2 15* 17* 21* 22 21* 22 23  GLUCOSE 160* 74 118* 131* 187* 168* 134*  BUN 62* 59* 57* 55* 53* 50* 49*  CREATININE 1.75* 1.77* 1.87* 1.96* 1.94* 2.04* 1.99*  CALCIUM 8.8* 8.9 9.1 9.2 8.9 8.7* 8.9  MG 1.8 1.8 1.9 1.8  --   --   --   PHOS  --   --   --   --   --   --  3.1   GFR Estimated Creatinine Clearance: 36.5 mL/min (A) (by C-G formula based on SCr of 1.99 mg/dL (H)). Liver Function Tests: Recent Labs  Lab 03/09/21 0215 03/11/21 0240 03/14/21 0331  AST 47* 54*  --   ALT 33 64*  --   ALKPHOS 280* 313*  --   BILITOT 0.8 1.0  --   PROT 6.3* 6.5  --   ALBUMIN 1.8* 1.9* 2.2*   No results for input(s): LIPASE, AMYLASE in the last 168 hours. No results for input(s): AMMONIA in the last 168 hours.  Coagulation profile Recent Labs  Lab 03/12/21 0000  INR 1.7*    CBC: Recent Labs  Lab 03/09/21 0215 03/10/21 0339 03/11/21 0240 03/12/21 0000 03/13/21 0308  WBC 15.9* 16.6* 17.6* 16.7* 17.1*  NEUTROABS 13.1* 13.6* 14.4*  --  13.7*  HGB 8.5* 9.3* 8.7* 9.0* 9.1*  HCT 27.5* 30.1* 29.2* 30.2* 29.9*  MCV 86.2 86.0 86.4 87.3 86.7  PLT 321 381 389 348 397   Cardiac Enzymes: No results for input(s): CKTOTAL, CKMB, CKMBINDEX, TROPONINI in the last 168 hours.  BNP (last 3 results) No results for input(s): PROBNP in the last 8760 hours. CBG: Recent Labs  Lab 03/13/21 2004 03/14/21 0007 03/14/21 0400 03/14/21 0834  03/14/21 1218  GLUCAP 277* 209* 140* 128* 204*   D-Dimer: No results for input(s): DDIMER in the last 72 hours. Hgb A1c: No results for input(s): HGBA1C in the last 72 hours. Lipid Profile: No results for input(s): CHOL, HDL, LDLCALC, TRIG, CHOLHDL, LDLDIRECT in the last 72 hours. Thyroid function studies: No results for input(s): TSH, T4TOTAL, T3FREE, THYROIDAB in the last 72 hours.  Invalid input(s): FREET3 Anemia work up: No results for input(s): VITAMINB12, FOLATE, FERRITIN, TIBC, IRON, RETICCTPCT in the last 72 hours. Sepsis Labs: Recent Labs  Lab 03/10/21 0339 03/11/21 0240 03/12/21 0000 03/13/21 0308  WBC 16.6* 17.6* 16.7* 17.1*    Microbiology Recent Results (from the past 240 hour(s))  Blood culture (routine x 2)     Status: None   Collection Time: 03/04/21  9:00 PM   Specimen: BLOOD  Result Value Ref Range Status   Specimen Description BLOOD LEFT ANTECUBITAL  Final   Special Requests   Final    BOTTLES DRAWN AEROBIC AND ANAEROBIC Blood Culture adequate volume   Culture   Final    NO GROWTH 5 DAYS Performed at [facility], [place] [address], [place], [place] [address]    Report Status 03/09/2021 FINAL  Final  Blood culture (routine x 2)     Status: None   Collection Time: 03/04/21  9:01 PM   Specimen: BLOOD RIGHT FOREARM  Result Value Ref Range Status   Specimen Description BLOOD RIGHT FOREARM  Final   Special Requests   Final    BOTTLES DRAWN AEROBIC AND ANAEROBIC Blood Culture adequate volume   Culture   Final    NO GROWTH 5 DAYS Performed at [facility], [place] [address] [place],  [address]  Report Status 03/09/2021 FINAL  Final  Resp Panel by RT-PCR (Flu A&B, Covid) Nasopharyngeal Swab     Status: None   Collection Time: 03/04/21 11:08 PM   Specimen: Nasopharyngeal Swab; Nasopharyngeal(NP) swabs in vial transport medium  Result Value Ref Range Status   SARS Coronavirus 2 by RT PCR NEGATIVE NEGATIVE Final    Comment:  (NOTE) SARS-CoV-2 target nucleic acids are NOT DETECTED.  The SARS-CoV-2 RNA is generally detectable in upper respiratory specimens during the acute phase of infection. The lowest concentration of SARS-CoV-2 viral copies this assay can detect is 138 copies/mL. A negative result does not preclude SARS-Cov-2 infection and should not be used as the sole basis for treatment or other patient management decisions. A negative result may occur with  improper specimen collection/handling, submission of specimen other than nasopharyngeal swab, presence of viral mutation(s) within the areas targeted by this assay, and inadequate number of viral copies(<138 copies/mL). A negative result must be combined with clinical observations, patient history, and epidemiological information. The expected result is Negative.  Fact Sheet for Patients:  EntrepreneurPulse.com.au  Fact Sheet for Healthcare Providers:  IncredibleEmployment.be  This test is no t yet approved or cleared by the [place] FDA and  has been authorized for detection and/or diagnosis of SARS-CoV-2 by FDA under an Emergency Use Authorization (EUA). This EUA will remain  in effect (meaning this test can be used) for the duration of the COVID-19 declaration under Section 564(b)(1) of the Act, 21 U.S.C.section 360bbb-3(b)(1), unless the authorization is terminated  or revoked sooner.       Influenza A by PCR NEGATIVE NEGATIVE Final   Influenza B by PCR NEGATIVE NEGATIVE Final    Comment: (NOTE) The Xpert Xpress SARS-CoV-2/FLU/RSV plus assay is intended as an aid in the diagnosis of influenza from Nasopharyngeal swab specimens and should not be used as a sole basis for treatment. Nasal washings and aspirates are unacceptable for Xpert Xpress SARS-CoV-2/FLU/RSV testing.  Fact Sheet for Patients: EntrepreneurPulse.com.au  Fact Sheet for Healthcare  Providers: IncredibleEmployment.be  This test is not yet approved or cleared by the [place] FDA and has been authorized for detection and/or diagnosis of SARS-CoV-2 by FDA under an Emergency Use Authorization (EUA). This EUA will remain in effect (meaning this test can be used) for the duration of the COVID-19 declaration under Section 564(b)(1) of the Act, 21 U.S.C. section 360bbb-3(b)(1), unless the authorization is terminated or revoked.  Performed at [facility], [place] [address] [place], [place] [address]   Urine Culture     Status: Abnormal   Collection Time: 03/05/21  8:06 PM   Specimen: Urine, Clean Catch  Result Value Ref Range Status   Specimen Description URINE, CLEAN CATCH  Final   Special Requests   Final    NONE Performed at [facility], [place] [address], [place], [place] [address]    Culture MULTIPLE SPECIES PRESENT, SUGGEST RECOLLECTION (A)  Final   Report Status 03/08/2021 FINAL  Final    Procedures and diagnostic studies:  No results found.             LOS: 9 days   Elliot Simoneaux  [employer], advertising on [email or web address]. If 7PM-7AM, please contact night-coverage at [email or web address]     03/14/2021, 1:40 PM

## 2021-03-14 NOTE — Progress Notes (Signed)
Patient ID: Larry Garner, male   DOB: 10-15-1955, 65 y.o.   MRN: [id number] [facility] Progress Note   Assessment/ Plan:   1. Acute kidney Injury: Appears hemodynamically mediated injury possibly with evolution to ischemic ATN.  It is plausible that there may be a contributing effect from intra-abdominal hypertension/renal vein compression given findings of abdominal CT scan.  Renal function slightly better this morning with volume expansion (undertaken because of his suboptimal oral intake).  Diuretics on hold.  I will reorder albumin infusion and assess labs again tomorrow morning-if continues to show recovery, may be able to discharge him home.  It is entirely plausible that this might be his new baseline renal function following significant acute kidney injury versus slow protracted improvement.  He does not have indications for dialysis at this time and urinalysis unremarkable.  2.  Hyponatremia: Secondary to acute kidney injury/impaired free water handling, monitor with volume replacement/renal recovery. 3.  Left side renal cell carcinoma: Ongoing chemotherapy at [facility] with [place] and plans noted for nephrectomy when surgically optimized including with nutritional status. 4.  Anemia of chronic disease/malignancy: Hemoglobin and hematocrit remained relatively stable, avoiding ESA with active malignancy. 5.  Protein calorie malnutrition/deconditioning: Challenges with oral intake/caloric provision noted and unable to get PEG tube for supplementation due to abdominal anatomy seen on CT scan. 6.  History of combined systolic/diastolic heart failure: Furosemide, spironolactone and Entresto currently on hold as we undertake gentle volume expansion.  Subjective:   Reports to be feeling fair and wants to try and get out before Monday so that he can go on vacation.   Objective:   BP 109/63 (BP Location: Left Arm)   Pulse 72   Temp 98.8 F (37.1 C) (Oral)   Resp 17    Ht '5\' 11"'$  (1.803 m)   Wt 69.7 kg   SpO2 98%   BMI 21.43 kg/m   Intake/Output Summary (Last 24 hours) at 03/14/2021 0932 Last data filed at 03/14/2021 0900 Gross per 24 hour  Intake 2040.78 ml  Output 850 ml  Net 1190.78 ml   Weight change: 4 kg  Physical Exam: Gen: Appears comfortable resting in bed, easy to awaken with limited conversation (aphasia) CVS: Pulse regular rhythm, normal rate, S1 and S2 normal Resp: Clear to auscultation with decreased breath sounds over bases Abd: Soft, nontender, bowel sounds normal Ext: No lower extremity edema  Imaging: No results found.  Labs: BMET Recent Labs  Lab 03/08/21 0304 03/09/21 0215 03/10/21 0339 03/11/21 0240 03/12/21 0000 03/13/21 0308 03/14/21 0331  NA 132* 135 135 135 134* 132* 133*  K 5.1 4.9 4.4 4.3 4.5 4.5 4.5  CL 107 106 101 101 99 100 101  CO2 15* 17* 21* 22 21* 22 23  GLUCOSE 160* 74 118* 131* 187* 168* 134*  BUN 62* 59* 57* 55* 53* 50* 49*  CREATININE 1.75* 1.77* 1.87* 1.96* 1.94* 2.04* 1.99*  CALCIUM 8.8* 8.9 9.1 9.2 8.9 8.7* 8.9  PHOS  --   --   --   --   --   --  3.1   CBC Recent Labs  Lab 03/09/21 0215 03/10/21 0339 03/11/21 0240 03/12/21 0000 03/13/21 0308  WBC 15.9* 16.6* 17.6* 16.7* 17.1*  NEUTROABS 13.1* 13.6* 14.4*  --  13.7*  HGB 8.5* 9.3* 8.7* 9.0* 9.1*  HCT 27.5* 30.1* 29.2* 30.2* 29.9*  MCV 86.2 86.0 86.4 87.3 86.7  PLT 321 381 389 348 397    Medications:  bisoprolol  10 mg Oral Daily   feeding supplement  237 mL Oral TID BM   folic acid  1 mg Oral Daily   insulin aspart  0-5 Units Subcutaneous QHS   insulin aspart  0-9 Units Subcutaneous TID WC   mirtazapine  15 mg Oral QHS   multivitamin with minerals  1 tablet Oral Daily   predniSONE  5 mg Oral Q breakfast   rosuvastatin  20 mg Oral Daily   cyanocobalamin  1,000 mcg Oral Daily   Elmarie Shiley, MD 03/14/2021, 9:32 AM

## 2021-03-15 DIAGNOSIS — N179 Acute kidney failure, unspecified: Secondary | ICD-10-CM | POA: Diagnosis not present

## 2021-03-15 DIAGNOSIS — E1165 Type 2 diabetes mellitus with hyperglycemia: Secondary | ICD-10-CM | POA: Diagnosis not present

## 2021-03-15 DIAGNOSIS — E1100 Type 2 diabetes mellitus with hyperosmolarity without nonketotic hyperglycemic-hyperosmolar coma (NKHHC): Secondary | ICD-10-CM | POA: Diagnosis not present

## 2021-03-15 LAB — CBC WITH DIFFERENTIAL/PLATELET
Abs Immature Granulocytes: 0.09 10*3/uL — ABNORMAL HIGH (ref 0.00–0.07)
Basophils Absolute: 0 10*3/uL (ref 0.0–0.1)
Basophils Relative: 0 %
Eosinophils Absolute: 0.1 10*3/uL (ref 0.0–0.5)
Eosinophils Relative: 1 %
HCT: 29.8 % — ABNORMAL LOW (ref 39.0–52.0)
Hemoglobin: 9.2 g/dL — ABNORMAL LOW (ref 13.0–17.0)
Immature Granulocytes: 1 %
Lymphocytes Relative: 10 %
Lymphs Abs: 1.5 10*3/uL (ref 0.7–4.0)
MCH: 26.6 pg (ref 26.0–34.0)
MCHC: 30.9 g/dL (ref 30.0–36.0)
MCV: 86.1 fL (ref 80.0–100.0)
Monocytes Absolute: 1.7 10*3/uL — ABNORMAL HIGH (ref 0.1–1.0)
Monocytes Relative: 11 %
Neutro Abs: 12.2 10*3/uL — ABNORMAL HIGH (ref 1.7–7.7)
Neutrophils Relative %: 77 %
Platelets: 347 10*3/uL (ref 150–400)
RBC: 3.46 MIL/uL — ABNORMAL LOW (ref 4.22–5.81)
RDW: 18.8 % — ABNORMAL HIGH (ref 11.5–15.5)
WBC: 15.7 10*3/uL — ABNORMAL HIGH (ref 4.0–10.5)
nRBC: 0 % (ref 0.0–0.2)

## 2021-03-15 LAB — RENAL FUNCTION PANEL
Albumin: 2.5 g/dL — ABNORMAL LOW (ref 3.5–5.0)
Anion gap: 10 (ref 5–15)
BUN: 48 mg/dL — ABNORMAL HIGH (ref 8–23)
CO2: 20 mmol/L — ABNORMAL LOW (ref 22–32)
Calcium: 9 mg/dL (ref 8.9–10.3)
Chloride: 102 mmol/L (ref 98–111)
Creatinine, Ser: 1.99 mg/dL — ABNORMAL HIGH (ref 0.61–1.24)
GFR, Estimated: 37 mL/min — ABNORMAL LOW (ref >60)
Glucose, Bld: 125 mg/dL — ABNORMAL HIGH (ref 70–99)
Phosphorus: 3.4 mg/dL (ref 2.5–4.6)
Potassium: 4.5 mmol/L (ref 3.5–5.1)
Sodium: 132 mmol/L — ABNORMAL LOW (ref 135–145)

## 2021-03-15 LAB — GLUCOSE, CAPILLARY
Glucose-Capillary: 100 mg/dL — ABNORMAL HIGH (ref 70–99)
Glucose-Capillary: 117 mg/dL — ABNORMAL HIGH (ref 70–99)
Glucose-Capillary: 170 mg/dL — ABNORMAL HIGH (ref 70–99)
Glucose-Capillary: 265 mg/dL — ABNORMAL HIGH (ref 70–99)

## 2021-03-15 MED ORDER — METFORMIN HCL 1000 MG PO TABS: 500.0000 mg | ORAL_TABLET | Freq: Two times a day (BID) | ORAL | Status: DC

## 2021-03-15 MED ORDER — NITROGLYCERIN 0.4 MG SL SUBL: 0.4000 mg | SUBLINGUAL_TABLET | SUBLINGUAL | Status: DC | PRN

## 2021-03-15 MED ORDER — MIRTAZAPINE 15 MG PO TABS: 15.0000 mg | ORAL_TABLET | Freq: Every day | ORAL | 0 refills | Status: DC

## 2021-03-15 MED ORDER — ROSUVASTATIN CALCIUM 20 MG PO TABS: 20.0000 mg | ORAL_TABLET | Freq: Every day | ORAL | Status: DC

## 2021-03-15 MED ORDER — BISOPROLOL FUMARATE 10 MG PO TABS: 10.0000 mg | ORAL_TABLET | Freq: Every day | ORAL | Status: DC

## 2021-03-15 MED ORDER — FAMCICLOVIR 500 MG PO TABS: 500.0000 mg | ORAL_TABLET | Freq: Every day | ORAL | Status: DC | PRN

## 2021-03-15 NOTE — Progress Notes (Signed)
Discharge instruction given however appetite stimulate med was not resumed paged MD to make aware.

## 2021-03-15 NOTE — Discharge Summary (Signed)
Physician Discharge Summary  Larry Garner WEX:[id number] DOB: 05-06-1956 DOA: 03/04/2021  PCP: Marin Olp, MD  Admit date: 03/04/2021 Discharge date: 03/15/2021  Discharge disposition: Home with home health services   Recommendations for Outpatient Follow-Up:   Follow-up with PCP in 1 week   Discharge Diagnosis:   Principal Problem:   Hyperosmolar hyperglycemic state (HHS) ([place]) Active Problems:   Pressure injury of skin   AKI (acute kidney injury) ([place])    Discharge Condition: Stable.  Diet recommendation:  Diet Order             Diet - low sodium heart healthy           Diet regular Room service appropriate? Yes with Assist; Fluid consistency: Thin  Diet effective now                     Code Status: Full Code     Hospital Course:   Larry Garner is a 65 y.o. 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 fib on Eliquis, anxiety, who presented to the hospital with poor oral intake, dizziness and significant weight loss.  He was hyperglycemic with glucose of 664, bicarb 15, creatinine of 2, potassium of 5.6 and WBC of 14.2.  He was admitted to the hospital for hyperosmolar hyperglycemic state and AKI complicated by hyperkalemia and non-anion gap metabolic acidosis.   He was treated with IV fluids and IV albumin infusion.PEG tube placement was considered because of poor oral intake.  However, patient and his wife decided against PEG tube placement because of concerns of abnormal CT abdomen and pelvis findings (ascites, concern for peritoneal carcinomatosis and body anasarca).  His condition has improved and he still stable for discharge to home today.  Discharge plan was discussed with the patient and his wife  at the bedside.    Medical Consultants:   Interventional radiologist Nephrologist   Discharge Exam:    Vitals:   03/15/21 0050 03/15/21 0350 03/15/21 0500 03/15/21 1050  BP:  108/73  116/73  Pulse:  79  67  Resp:  16  17  Temp:  97.8 F (36.6 C)  98 F (36.7 C)  TempSrc:      SpO2:  99%  99%  Weight: 68.8 kg  68.8 kg   Height:         GEN: NAD SKIN: Warm and dry EYES: No pallor or icterus ENT: MMM CV: RRR PULM: CTA B ABD: soft, ND, NT, +BS CNS: AAO x 3, non focal EXT: Mild bilateral leg edema.  No erythema or tenderness.   The results of significant diagnostics from this hospitalization (including imaging, microbiology, ancillary and laboratory) are listed below for reference.     Procedures and Diagnostic Studies:   US Renal  Result Date: 03/04/2021 CLINICAL DATA:  Renal failure EXAM: RENAL / URINARY TRACT ULTRASOUND COMPLETE COMPARISON:  CT 02/21/2020 FINDINGS: Right Kidney: Renal measurements: 13.8 x 5.5 x 5.6 cm = volume: 221 mL. Echogenicity remains within normal limits. Some mild renal sinus lipomatosis, often senescent. Two small simple appearing exophytic cyst in the mid to upper pole right kidney, largest measuring 2.4 cm. The second measuring 1.6 cm. No concerning internal complexity. No concerning renal mass. No visible shadowing calculus or hydronephrosis. Left Kidney: Renal measurements:  13 x 4.5 x 4.9 cm = volume: 149 mL. Echogenicity within normal limits. Large masslike focus measuring 10.9 x 7.1 x 8.4 cm seen along the anterior margin of left kidney with some indentation of the renal parenchyma. Of note, a large subcapsular hematoma with seen in this vicinity previously. This could reflect some chronic hematoma or an underlying mass lesion, incompletely characterized on this exam. No hydronephrosis or shadowing calculus. Bladder: Appears normal for degree of bladder distention. Other: None. IMPRESSION: Large heterogeneous masslike focus abutting the left  renal parenchyma. This is in the vicinity of a large hematoma seen on comparison imaging from 2021. Could reflect some recurrent or a degree of chronic hemorrhage though an underlying mass lesion is not fully excluded. Consider renal protocol CT or MR imaging for further characterization. Simple appearing cysts in the kidneys. Electronically Signed   By: Lovena Le M.D.   On: 03/04/2021 23:15   DG Chest Port 1 View  Result Date: 03/04/2021 CLINICAL DATA:  Syncope EXAM: PORTABLE CHEST 1 VIEW COMPARISON:  07/06/2020 FINDINGS: Mild cardiac enlargement. Left coronary stent. Negative for heart failure. Surgical clips left hilum from prior resection. Lungs well aerated and clear. No infiltrate or effusion. No change from the prior study. IMPRESSION: No active disease. Electronically Signed   By: Franchot Gallo M.D.   On: 03/04/2021 21:20     Labs:   Basic Metabolic Panel: Recent Labs  Lab 03/09/21 0215 03/10/21 0339 03/11/21 0240 03/12/21 0000 03/13/21 0308 03/14/21 0331 03/15/21 0343  NA 135 135 135 134* 132* 133* 132*  K 4.9 4.4 4.3 4.5 4.5 4.5 4.5  CL 106 101 101 99 100 101 102  CO2 17* 21* 22 21* 22 23 20*  GLUCOSE 74 118* 131* 187* 168* 134* 125*  BUN 59* 57* 55* 53* 50* 49* 48*  CREATININE 1.77* 1.87* 1.96* 1.94* 2.04* 1.99* 1.99*  CALCIUM 8.9 9.1 9.2 8.9 8.7* 8.9 9.0  MG 1.8 1.9 1.8  --   --   --   --   PHOS  --   --   --   --   --  3.1 3.4   GFR Estimated Creatinine Clearance: 36 mL/min (A) (by C-G formula based on SCr of 1.99 mg/dL (H)). Liver Function Tests: Recent Labs  Lab 03/09/21 0215 03/11/21 0240 03/14/21 0331 03/15/21 0343  AST 47* 54*  --   --   ALT 33 64*  --   --   ALKPHOS 280* 313*  --   --   BILITOT 0.8 1.0  --   --   PROT 6.3* 6.5  --   --   ALBUMIN 1.8* 1.9* 2.2* 2.5*   No results for input(s): LIPASE, AMYLASE in the last 168 hours. No results for input(s): AMMONIA in the last 168 hours. Coagulation profile Recent Labs  Lab 03/12/21 0000  INR  1.7*    CBC: Recent Labs  Lab 03/09/21 0215 03/10/21 0339 03/11/21 0240 03/12/21 0000 03/13/21 0308 03/15/21 0343  WBC 15.9* 16.6* 17.6* 16.7* 17.1* 15.7*  NEUTROABS 13.1* 13.6* 14.4*  --  13.7* 12.2*  HGB 8.5* 9.3* 8.7* 9.0* 9.1* 9.2*  HCT 27.5* 30.1* 29.2* 30.2* 29.9* 29.8*  MCV 86.2 86.0 86.4 87.3 86.7 86.1  PLT 321 381 389 348 397 347   Cardiac Enzymes: No results for input(s): CKTOTAL, CKMB, CKMBINDEX, TROPONINI in the last 168 hours. BNP: Invalid input(s): POCBNP CBG: Recent Labs  Lab 03/14/21 2001 03/15/21 0016 03/15/21 0349 03/15/21 0722 03/15/21 1049  GLUCAP  298* 170* 117* 100* 265*   D-Dimer No results for input(s): DDIMER in the last 72 hours. Hgb A1c No results for input(s): HGBA1C in the last 72 hours. Lipid Profile No results for input(s): CHOL, HDL, LDLCALC, TRIG, CHOLHDL, LDLDIRECT in the last 72 hours. Thyroid function studies No results for input(s): TSH, T4TOTAL, T3FREE, THYROIDAB in the last 72 hours.  Invalid input(s): FREET3 Anemia work up No results for input(s): VITAMINB12, FOLATE, FERRITIN, TIBC, IRON, RETICCTPCT in the last 72 hours. Microbiology Recent Results (from the past 240 hour(s))  Urine Culture     Status: Abnormal   Collection Time: 03/05/21  8:06 PM   Specimen: Urine, Clean Catch  Result Value Ref Range Status   Specimen Description URINE, CLEAN CATCH  Final   Special Requests   Final    NONE Performed at [facility], [place] [address], [place], [place] [address]    Culture MULTIPLE SPECIES PRESENT, SUGGEST RECOLLECTION (A)  Final   Report Status 03/08/2021 FINAL  Final     Discharge Instructions:   Discharge Instructions     Diet - low sodium heart healthy   Complete by: As directed    Discharge wound care:   Complete by: As directed    Avoid laying or sitting on your back or buttock for too long   Increase activity slowly   Complete by: As directed       Allergies as of 03/15/2021       Reactions    Morphine Sulfate Nausea And Vomiting, Swelling        Medication List     STOP taking these medications    ALPRAZolam 0.5 MG tablet Commonly known as: XANAX   Entresto 49-51 MG Generic drug: sacubitril-valsartan   glimepiride 2 MG tablet Commonly known as: AMARYL   hydrOXYzine 25 MG tablet Commonly known as: ATARAX/VISTARIL   oxymetazoline 0.05 % nasal spray Commonly known as: AFRIN   spironolactone 25 MG tablet Commonly known as: ALDACTONE   zaleplon 10 MG capsule Commonly known as: SONATA       TAKE these medications    albuterol 108 (90 Base) MCG/ACT inhaler Commonly known as: VENTOLIN HFA Inhale 2 puffs into the lungs every 6 (six) hours as needed for wheezing or shortness of breath.   apixaban 5 MG Tabs tablet Commonly known as: ELIQUIS Take 1 tablet (5 mg total) by mouth in the morning and at bedtime.   Azelastine-Fluticasone 137-50 MCG/ACT Susp Place 1 spray into the nose daily as needed (allergies).   bisoprolol 10 MG tablet Commonly known as: ZEBETA Take 1 tablet (10 mg total) by mouth daily.   Contour Next One Kit Use to test blood sugar daily. Dx: E11.9   cyanocobalamin 1000 MCG tablet Take 1,000 mcg by mouth daily.   empagliflozin 10 MG Tabs tablet Commonly known as: Jardiance Take 1 tablet (10 mg total) by mouth daily before breakfast.   famciclovir 500 MG tablet Commonly known as: FAMVIR Take 1 tablet (500 mg total) by mouth daily as needed (fever blister). What changed: See the new instructions.   furosemide 40 MG tablet Commonly known as: LASIX Take 1 tablet (40 mg total) by mouth daily.   glucose blood test strip Commonly known as: Visual merchandiser Next Test Use to test blood sugars daily. Dx: E11.9   metFORMIN 1000 MG tablet Commonly known as: GLUCOPHAGE Take 0.5 tablets (500 mg total) by mouth 2 (two) times daily. What changed:  how much to take Another  medication with the same name was removed. Continue taking this  medication, and follow the directions you see here.   mirtazapine 15 MG tablet Commonly known as: REMERON Take 1 tablet (15 mg total) by mouth at bedtime.   multivitamin tablet Take 1 tablet by mouth daily.   nitroGLYCERIN 0.4 MG SL tablet Commonly known as: NITROSTAT Place 1 tablet (0.4 mg total) under the tongue every 5 (five) minutes as needed. What changed: See the new instructions.   predniSONE 5 MG tablet Commonly known as: DELTASONE Take 5 mg by mouth daily.   rosuvastatin 20 MG tablet Commonly known as: CRESTOR Take 1 tablet (20 mg total) by mouth daily.   triamcinolone cream 0.1 % Commonly known as: KENALOG Apply 1 application topically 2 (two) times daily as needed (itching). What changed: Another medication with the same name was removed. Continue taking this medication, and follow the directions you see here.               Durable Medical Equipment  (From admission, onward)           Start     Ordered   03/05/21 1744  For home use only DME Hospital bed  Once       Question Answer Comment  Length of Need Lifetime   Patient has (list medical condition): chf   The above medical condition requires: Patient requires the ability to reposition frequently   Head must be elevated greater than: 30 degrees   Bed type Semi-electric   Support Surface: Gel Overlay      03/05/21 1744              Discharge Care Instructions  (From admission, onward)           Start     Ordered   03/15/21 0000  Discharge wound care:       Comments: Avoid laying or sitting on your back or buttock for too long   03/15/21 1041            Follow-up Information     [facility] Follow up.   Specialty: Home Health Services Why: HHPT, Contact information: [place] [address] [phone]         [facility] Follow up.   Why: Hospital bed Contact information: [place]  [id number] [phone]                  Time coordinating discharge: 32 minutes  Signed:  [facility] 03/15/2021, 12:45 PM   Pager on [email or web address]. If 7PM-7AM, please contact night-coverage at [email or web address]

## 2021-03-15 NOTE — Progress Notes (Signed)
Patient ID: Larry Garner, male   DOB: 12-22-1955, 65 y.o.   MRN: [id number] [facility] Progress Note   Assessment/ Plan:   1. Acute kidney Injury: Likely hemodynamically mediated with decreased oral intake/prerenal mechanism possibly evolving into mild ATN.  CT scan of the abdomen raising concern for mild intra-abdominal hypertension and renal function now appears essentially unchanged at what may be a new baseline creatinine or plateau phase of ATN.  He is clinically stable enough to discharge home with outpatient follow-up relatively close (8/16) with [facility] for management of [place] when he will have additional labs done.  I would recommend discharging him home on furosemide and holding Entresto/spironolactone at this time as oral intake may be unreliable.  These medications can be started after follow-up labs done in 1 week show stable or improving renal function.  Discussed sodium restriction.  2.  Hyponatremia: Secondary to acute kidney injury/impaired free water handling, restart furosemide 40 mg daily. 3.  Left side renal cell carcinoma: Ongoing chemotherapy at [facility] with [place] and plans noted for nephrectomy when surgically optimized including with nutritional status. 4.  Anemia of chronic disease/malignancy: Hemoglobin and hematocrit remained relatively stable, avoiding ESA with active malignancy. 5.  Protein calorie malnutrition/deconditioning: Challenges with oral intake/caloric provision noted and unable to get PEG tube for supplementation due to abdominal anatomy seen on CT scan. 6.  History of combined systolic/diastolic heart failure: Furosemide, spironolactone and Entresto previously held in the setting of acute kidney injury/prerenal azotemia.  Restart furosemide and continue to hold spironolactone/Entresto at discharge until follow-up labs done at follow-up with [facility].  Subjective:   Reports to be feeling fair and denies any chest pain or  shortness of breath.   Objective:   BP 108/73   Pulse 79   Temp 97.8 F (36.6 C)   Resp 16   Ht '5\' 11"'$  (1.803 m)   Wt 68.8 kg   SpO2 99%   BMI 21.15 kg/m   Intake/Output Summary (Last 24 hours) at 03/15/2021 0930 Last data filed at 03/15/2021 0600 Gross per 24 hour  Intake 100 ml  Output 950 ml  Net -850 ml   Weight change: -0.9 kg  Physical Exam: Gen: Appears comfortable resting in bed, easy to awaken with limited conversation (aphasia) CVS: Pulse regular rhythm, normal rate, S1 and S2 normal Resp: Clear to auscultation with decreased breath sounds over bases Abd: Soft, nontender, bowel sounds normal Ext: Trace lower extremity edema  Imaging: No results found.  Labs: BMET Recent Labs  Lab 03/09/21 0215 03/10/21 0339 03/11/21 0240 03/12/21 0000 03/13/21 0308 03/14/21 0331 03/15/21 0343  NA 135 135 135 134* 132* 133* 132*  K 4.9 4.4 4.3 4.5 4.5 4.5 4.5  CL 106 101 101 99 100 101 102  CO2 17* 21* 22 21* 22 23 20*  GLUCOSE 74 118* 131* 187* 168* 134* 125*  BUN 59* 57* 55* 53* 50* 49* 48*  CREATININE 1.77* 1.87* 1.96* 1.94* 2.04* 1.99* 1.99*  CALCIUM 8.9 9.1 9.2 8.9 8.7* 8.9 9.0  PHOS  --   --   --   --   --  3.1 3.4   CBC Recent Labs  Lab 03/10/21 0339 03/11/21 0240 03/12/21 0000 03/13/21 0308 03/15/21 0343  WBC 16.6* 17.6* 16.7* 17.1* 15.7*  NEUTROABS 13.6* 14.4*  --  13.7* 12.2*  HGB 9.3* 8.7* 9.0* 9.1* 9.2*  HCT 30.1* 29.2* 30.2* 29.9* 29.8*  MCV 86.0 86.4 87.3 86.7 86.1  PLT 381 389  348 397 347    Medications:     bisoprolol  10 mg Oral Daily   feeding supplement  237 mL Oral TID BM   folic acid  1 mg Oral Daily   insulin aspart  0-5 Units Subcutaneous QHS   insulin aspart  0-9 Units Subcutaneous TID WC   mirtazapine  15 mg Oral QHS   multivitamin with minerals  1 tablet Oral Daily   predniSONE  5 mg Oral Q breakfast   rosuvastatin  20 mg Oral Daily   cyanocobalamin  1,000 mcg Oral Daily   Elmarie Shiley, MD 03/15/2021, 9:30 AM

## 2021-03-16 ENCOUNTER — Telehealth: Payer: Self-pay

## 2021-03-16 NOTE — Telephone Encounter (Signed)
Team please tell wife with worsening kidney function I think holding those medicines makes sense - they tend to stress the kidneys- doesn't mean cannot be added back later. I am certainly fine with them getting cardiology opinion on this

## 2021-03-16 NOTE — Telephone Encounter (Cosign Needed)
Transition Care Management Follow-up Telephone Call Date of discharge and from where: 03/15/21 from    How have you been since you were released from the hospital? Better Any questions or concerns? Yes  Items Reviewed: Did the pt receive and understand the discharge instructions provided? Yes  Medications obtained and verified? Yes  Other? No  Any new allergies since your discharge? No  Dietary orders reviewed? Yes Do you have support at home? Yes   Home Care and Equipment/Supplies: Were home health services ordered? yes If so, what is the name of the agency? [facility]  Has the agency set up a time to come to the patient's home? yes Were any new equipment or medical supplies ordered?  Yes: Hospital bed What is the name of the medical supply agency? [facility] Were you able to get the supplies/equipment? yes Do you have any questions related to the use of the equipment or supplies? No  Functional Questionnaire: (I = Independent and D = Dependent) ADLs: I  Bathing/Dressing- I  Meal Prep- I  Eating- I  Maintaining continence- I  Transferring/Ambulation- I  Managing Meds- I WIFE ASSIST   Follow up appointments reviewed:  PCP Hospital f/u appt confirmed? Yes  Scheduled to see Dr Yong Channel on 03/25/21 @ 10:00. [facility] f/u appt confirmed? No   Are transportation arrangements needed? No  If their condition worsens, is the pt aware to call PCP or go to the Emergency Dept.? Yes Was the patient provided with contact information for the PCP's office or ED? Yes Was to pt encouraged to call back with questions or concerns? Yes

## 2021-03-19 NOTE — Telephone Encounter (Signed)
Was able to reach pt wife and gave below message.

## 2021-03-19 NOTE — Telephone Encounter (Signed)
LM for pt wife tcb.

## 2021-03-23 ENCOUNTER — Other Ambulatory Visit: Payer: Medicare Other

## 2021-03-25 ENCOUNTER — Ambulatory Visit (INDEPENDENT_AMBULATORY_CARE_PROVIDER_SITE_OTHER): Payer: Medicare Other | Admitting: Family Medicine

## 2021-03-25 ENCOUNTER — Other Ambulatory Visit: Payer: Self-pay

## 2021-03-25 ENCOUNTER — Telehealth: Payer: Self-pay

## 2021-03-25 ENCOUNTER — Encounter: Payer: Self-pay | Admitting: Family Medicine

## 2021-03-25 VITALS — BP 129/72 | HR 72 | Temp 98.1°F | Ht 71.0 in | Wt 143.4 lb

## 2021-03-25 DIAGNOSIS — Z8673 Personal history of transient ischemic attack (TIA), and cerebral infarction without residual deficits: Secondary | ICD-10-CM

## 2021-03-25 DIAGNOSIS — N179 Acute kidney failure, unspecified: Secondary | ICD-10-CM

## 2021-03-25 DIAGNOSIS — E1169 Type 2 diabetes mellitus with other specified complication: Secondary | ICD-10-CM | POA: Diagnosis not present

## 2021-03-25 DIAGNOSIS — C642 Malignant neoplasm of left kidney, except renal pelvis: Secondary | ICD-10-CM | POA: Diagnosis not present

## 2021-03-25 DIAGNOSIS — I5042 Chronic combined systolic (congestive) and diastolic (congestive) heart failure: Secondary | ICD-10-CM

## 2021-03-25 DIAGNOSIS — E43 Unspecified severe protein-calorie malnutrition: Secondary | ICD-10-CM

## 2021-03-25 DIAGNOSIS — E785 Hyperlipidemia, unspecified: Secondary | ICD-10-CM | POA: Diagnosis not present

## 2021-03-25 MED ORDER — GLIPIZIDE 5 MG PO TABS: 2.5000 mg | ORAL_TABLET | Freq: Every day | ORAL | 3 refills | Status: DC

## 2021-03-25 NOTE — Progress Notes (Signed)
Phone [phone]   Subjective:  Larry Garner is a 65 y.o. year old very pleasant male patient who presents for transitional care management and hospital follow up for hyperosmolar hyperglycemic state with resulting delirium. Patient was hospitalized from 03/04/2021 to 03/15/2021. A TCM phone call was completed on 03/10/2021. Medical complexity moderate  Patient presented to ED with poor oral intake, dizziness, confusion (delirium from acute illness), and significant weight loss. He was hyperglycemic with glucose of 664, bicarb 15, creatinine of 2, potassium of 5.6, and WBC of 14.2. Patient was admitted to the hospital for Hyperosmolar hyperglycemic syndrome (HHS) and AKI complicated by hyperkaliemia and non-anion gap metabolic acidosis.  He was treated with IV fluids and IV albumin infusion. PEG tube placement was considered because of poor oral intake however patient and his wife decided against PEG tube placement due to concerns of abnormal CT abdomen and pelvis finding.  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 His creatinine stabilized around 2- nephrology was consulted. Eventually spironolactone and entresto were held. Dr. Posey Pronto recommended could restart entresto/spironolactone after 1 week if labs stable- which they were yesterday with [facility].   Blood sugars trended down and patient discharged on his baseline metformin and Jardiance and glimepiride..  Confusion improved prior to discarge. Patient was thought to be stable and discharged home.  He was started on remeron to help stimulate appetite.  Since discharge-Patient was able to spend some time at the beach after discharge with some lifelong friends.  His appetite has increased and weight is beginning to improve-lowest weight of 124 at home and currently 143 but this is lower than the last in office visit on June 22  Seen at [facility] oncology yesterday and cbg 245, BUN 48, potassium 3.3, cr 2.08, alk phos 262,  albumin 2.9, tsh normal, wbc 11.2k, hgb 9.5 stable from discharge.  Apparently patient's last 2 immunotherapy treatments were held to try to promote appetite.  Yesterday oncologist Dr. Roslyn Smiling wanted to stop immunotherapy due to level of debility- not sure if treating cancer will extend or improve quality of life.  -Patient has extended family members who want to push for evaluation at [facility] oncology-patient is not interested in this Family wants to push for [facility]  Blood sugars since being home have been variable-Still on jardiance 10 mg,  metformin 250 am, 250 pm, off glimepiride per hospital. Declines insulin. Prednisone raising blood sugar but needed for diffuse pruritus 160  Th 151 Fr 216 Sat 189 Sun 150 Mon 270 Tues 259- mild nausea/some emesis and then had coke likely led to reading Today 174  Patient will have [facility] for PT at home- I am in support of this and can sign off on paperwork based on visit today.    See problem oriented charting as well  Past Medical History-  Patient Active Problem List   Diagnosis Date Noted   History of cryptogenic stroke-now on Eliquis 10/06/2020    Priority: High   Chronic combined systolic and diastolic heart failure ([place]) 07/17/2020    Priority: High   Renal cell carcinoma ([place]) 07/17/2020    Priority: High   Type 2 diabetes mellitus with other specified complication ([place]) [id number]    Priority: High   Coronary artery disease involving native coronary artery of native heart without angina pectoris 04/06/2007    Priority: High   AKI (acute kidney injury) ([place]) 03/11/2021    Priority: Medium   Protein-calorie malnutrition, severe 07/08/2020    Priority: Medium  OSA (obstructive sleep apnea) 01/09/2018    Priority: Medium   Pulmonary nodule, left 03/15/2017    Priority: Medium   Insomnia 05/27/2014    Priority: Medium   Asthma, moderate persistent 06/19/2008    Priority: Medium   ERECTILE DYSFUNCTION, SECONDARY TO MEDICATION  05/17/2007    Priority: Medium   Hyperlipidemia 04/06/2007    Priority: Medium   Anxiety state 04/06/2007    Priority: Medium   Essential hypertension 04/06/2007    Priority: Medium   Former smoker 11/04/2014    Priority: Low   Plantar wart of left foot 11/27/2010    Priority: Low   PSA, INCREASED 09/15/2009    Priority: Low   Diverticulitis of colon 05/08/2009    Priority: Low   ANKLE PAIN, CHRONIC 06/05/2007    Priority: Low   Allergic rhinitis 05/17/2007    Priority: Low   Pressure injury of skin 03/11/2021   Hyperosmolar hyperglycemic state (HHS) ([place]) 03/05/2021   Nonischemic cardiomyopathy ([place]) 10/06/2020   Acute respiratory failure with hypoxia ([place]) 07/05/2020   Iron deficiency anemia due to chronic blood loss 06/19/2020    Medications- reviewed and updated  A medical reconciliation was performed comparing current medicines to hospital discharge medications. Current Outpatient Medications  Medication Sig Dispense Refill   albuterol (VENTOLIN HFA) 108 (90 Base) MCG/ACT inhaler Inhale 2 puffs into the lungs every 6 (six) hours as needed for wheezing or shortness of breath. 18 g 11   apixaban (ELIQUIS) 5 MG TABS tablet Take 1 tablet (5 mg total) by mouth in the morning and at bedtime. 180 tablet 3   Azelastine-Fluticasone 137-50 MCG/ACT SUSP Place 1 spray into the nose daily as needed (allergies).     bisoprolol (ZEBETA) 10 MG tablet Take 1 tablet (10 mg total) by mouth daily.     Blood Glucose Monitoring Suppl (CONTOUR NEXT ONE) KIT Use to test blood sugar daily. Dx: E11.9 1 kit 3   cyanocobalamin 1000 MCG tablet Take 1,000 mcg by mouth daily.     empagliflozin (JARDIANCE) 10 MG TABS tablet Take 1 tablet (10 mg total) by mouth daily before breakfast. 30 tablet 0   famciclovir (FAMVIR) 500 MG tablet Take 1 tablet (500 mg total) by mouth daily as needed (fever blister).     furosemide (LASIX) 40 MG tablet Take 1 tablet (40 mg total) by mouth daily. 90 tablet 3   glipiZIDE  (GLUCOTROL) 5 MG tablet Take 0.5-1 tablets (2.5-5 mg total) by mouth daily before breakfast. If blood sugar over 110 take half tablet, if over 160 take full tablet 60 tablet 3   glucose blood (BAYER CONTOUR NEXT TEST) test strip Use to test blood sugars daily. Dx: E11.9 100 each 12   metFORMIN (GLUCOPHAGE) 1000 MG tablet Take 0.5 tablets (500 mg total) by mouth 2 (two) times daily.     mirtazapine (REMERON) 15 MG tablet Take 1 tablet (15 mg total) by mouth at bedtime. 30 tablet 0   Multiple Vitamin (MULTIVITAMIN) tablet Take 1 tablet by mouth daily.     nitroGLYCERIN (NITROSTAT) 0.4 MG SL tablet Place 1 tablet (0.4 mg total) under the tongue every 5 (five) minutes as needed.     predniSONE (DELTASONE) 5 MG tablet Take 5 mg by mouth daily.     rosuvastatin (CRESTOR) 20 MG tablet Take 1 tablet (20 mg total) by mouth daily.     triamcinolone cream (KENALOG) 0.1 % Apply 1 application topically 2 (two) times daily as needed (itching).  No current facility-administered medications for this visit.   Objective  Objective:  BP 129/72   Pulse 72   Temp 98.1 F (36.7 C) (Temporal)   Ht _0  (1.803 m)   Wt 143 lb 6.4 oz (65 kg)   SpO2 97%   BMI 20.00 kg/m  Gen: sunken cheeks, muscle wasting compared to baseline CV: RRR no murmurs rubs or gallops Lungs: CTAB no crackles, wheeze, rhonchi Abdomen: soft/nontender/nondistended/normal bowel sounds. No rebound or guarding.  Ext: 2+ edemag Skin: warm, dry Neuro: walks with walker today, minimal speech compared to pre stroke visits    Assessment and Plan:   Renal cell carcinoma ([place]) Original planned left total nephrectomy but this was complicated by significant CVA and later significant weight loss/stability-patient is not currently strong enough malnutrition too high to proceed with surgery- probability he would not survive surgery or potential complications.  Due to level of debility immunotherapy has even been stopped-fortunately cancer  appears stable on recent imaging.  Oncology wants to consider transitioning to palliative care or hospice even.  Patient does not seem interested in getting a second opinion at [facility] though he has had some pressure from family members to do so-I told him I was happy to place a referral if this would give him peace of mind but he shook his head strongly no to this.  Ultimately patient was in agreement to palliative care consult and even to consider hospice with goal of focusing on quality of life at this point (and at present opts out of [place] oncology referral which I think is reasonable)-we discussed if he were to make significant strides we could always evaluate again-I have had patients come off hospice in the past.  I wonder if reducing his number of medical visits/care/stopping immunotherapy may improve quality of life and give him more space to progress with weight gain/improving nutrition.  I even spoke to him about potentially canceling upcoming neurology visit or future visits-he is going at this time to at least proceed forward with that.  Type 2 diabetes mellitus with other specified complication (HCC) Lab Results  Component Value Date   HGBA1C 6.8 (H) 12/12/2020  Last A1c was well controlled but patient did develop hyperosmolar hyperglycemic state after higher levels of milkshake intake to  try to help with weight gain.  We considered rechecking A1c today but just had labs yesterday and unlikely to change management of sugar at this time- anticipate A1c would be higher  For now we will continue metformin 250 mg twice daily, Jardiance 10 mg.  Blood sugars fasting at home over the last week ranging from 150-270 we will add back in glipizide instead of glimepiride to try to avoid low blood sugars.  We discussed patient likely in range where we could use insulin but for quality of life purposes he declines  History of cryptogenic stroke-now on Eliquis Unfortunately stroke caused significant  setback for patient and has been a barrier to his treatment for renal cell carcinoma as well as lung mass.  We will continue Eliquis and statin for now unless plan changes with palliative care  Chronic combined systolic and diastolic heart failure (HCC) Edema is worsening off of spironolactone.  Nephrology recommended restarting Entresto and spironolactone if renal function was stable outside of the hospital and it was yesterday-we will restart both of these and patient will let me know if symptoms fail to stabilize or improve  Essential hypertension Well-controlled on bisoprolol and Lasix 40 mg alone-restarting spironolactone and Entresto-he  has tolerated this previously and I believe blood pressure can tolerate-needed for heart failure treatment  Protein-calorie malnutrition, severe Patient finally seeing some improvement in appetite on Remeron-we will certainly continue this at present  AKI (acute kidney injury) ([place]) Component     Latest Ref Rng & Units 11/20/2020 03/04/2021          8:52 PM  Sodium     135 - 145 mmol/L 133 (L) 125 (L)  Potassium     3.5 - 5.1 mmol/L 5.1 5.1  Chloride     98 - 111 mmol/L 102 100  CO2     22 - 32 mmol/L 23 15 (L)  Glucose     70 - 99 mg/dL 110 (H) 624 (HH)  BUN     8 - 23 mg/dL 42 (H) 100 (H)  Creatinine     0.61 - 1.24 mg/dL 1.05 2.14 (H)  Calcium     8.9 - 10.3 mg/dL 9.8 9.3  Phosphorus     2.5 - 4.6 mg/dL    Albumin     3.5 - 5.0 g/dL 3.0 (L) 2.0 (L)  GFR, Estimated     >60 mL/min  34 (L)  Anion gap     5 - 15  10  Creatinine significantly worsened from April until [place] hospitalization creatinine stayed stable around 2- I am suspicious this is more of a subacute injury from long-term decreased hydration-did not significantly improve off Entresto or spironolactone reasonable to restart these and recheck renal function next month especially since appetite is increased  Recommended follow up: keep September follow up  Future  Appointments  Date Time Provider [place]  05/04/2021  3:40 PM Marin Olp, MD [facility]  07/10/2021 11:00 AM Josue Hector, MD [facility]   Lab/Order associations:   ICD-10-CM   1. Type 2 diabetes mellitus with other specified complication, without long-term current use of insulin (HCC)  E11.69     2. Coronary artery disease involving native coronary artery of native heart without angina pectoris  I25.10     3. Hyperlipidemia, unspecified hyperlipidemia type  E78.5     4. Essential hypertension  I10     5. Renal cell carcinoma of left kidney (HCC)  C64.2 Amb Referral to Palliative Care    CANCELED: Amb Referral to Palliative Care    6. Cryptogenic stroke (HCC)  I63.9 Amb Referral to Palliative Care    CANCELED: Amb Referral to Palliative Care    7. History of stroke  Z86.73     8. Chronic combined systolic and diastolic heart failure (HCC)  I50.42     9. Protein-calorie malnutrition, severe  E43     10. AKI (acute kidney injury) ([place])  N17.9       Meds ordered this encounter  Medications   glipiZIDE (GLUCOTROL) 5 MG tablet    Sig: Take 0.5-1 tablets (2.5-5 mg total) by mouth daily before breakfast. If blood sugar over 110 take half tablet, if over 160 take full tablet    Dispense:  60 tablet    Refill:  3    I,Jada Bradford,acting as a scribe for [name], MD.,have documented all relevant documentation on the behalf of [name], MD,as directed by  [name], MD while in the presence of [name], MD.  I, [name], MD, have reviewed all documentation for this visit. The documentation on 03/25/21 for the exam, diagnosis, procedures, and orders are all accurate and complete.  Time Spent: 44  minutes of total time (10:30 Am- 11:13 AM, 2:30-2:53 PM) was spent on the date of the encounter performing the following actions: chart review prior to seeing the patient, obtaining history, performing a medically necessary  exam, counseling on the treatment plan, placing orders, and documenting in our EHR.  -we will be spending additional time coordinating palliative care vs. Hospice as well as home health   Return precautions advised.  [name], MD

## 2021-03-25 NOTE — Assessment & Plan Note (Signed)
Component     Latest Ref Rng & Units 11/20/2020 03/04/2021          8:52 PM  Sodium     135 - 145 mmol/L 133 (L) 125 (L)  Potassium     3.5 - 5.1 mmol/L 5.1 5.1  Chloride     98 - 111 mmol/L 102 100  CO2     22 - 32 mmol/L 23 15 (L)  Glucose     70 - 99 mg/dL 110 (H) 624 (HH)  BUN     8 - 23 mg/dL 42 (H) 100 (H)  Creatinine     0.61 - 1.24 mg/dL 1.05 2.14 (H)  Calcium     8.9 - 10.3 mg/dL 9.8 9.3  Phosphorus     2.5 - 4.6 mg/dL    Albumin     3.5 - 5.0 g/dL 3.0 (L) 2.0 (L)  GFR, Estimated     >60 mL/min  34 (L)  Anion gap     5 - 15  10  Creatinine significantly worsened from April until July-during hospitalization creatinine stayed stable around 2- I am suspicious this is more of a subacute injury from long-term decreased hydration-did not significantly improve off Entresto or spironolactone reasonable to restart these and recheck renal function next month especially since appetite is increased

## 2021-03-25 NOTE — Assessment & Plan Note (Signed)
Patient finally seeing some improvement in appetite on Remeron-we will certainly continue this at present

## 2021-03-25 NOTE — Telephone Encounter (Signed)
Spoke with patient wife Larry Garner and scheduled an in-person Palliative Consult for 03/26/21 @ 8:30 AM. with Dr. Hollace Kinnier. Documentation will be noted in [place]. Referral was sent as Urgent.   COVID screening was negative. 2 dogs in the home will put away before provider arrives. Patient lives with wife and daughter.   Consent obtained; updated Outlook/Netsmart/Team List and Epic.

## 2021-03-25 NOTE — Patient Instructions (Addendum)
Start glipizide as per instructions  We will call you within two weeks about your referral to palliative care. If you do not hear within 2 weeks, give [place] a call.  -team would really like for this to be sooner- so entered as urgent- let referral coordinator know  Restart entresto and spironolactone  Recommended follow up: keep visit in september

## 2021-03-25 NOTE — Assessment & Plan Note (Signed)
Lab Results  Component Value Date   HGBA1C 6.8 (H) 12/12/2020  Last A1c was well controlled but patient did develop hyperosmolar hyperglycemic state after higher levels of milkshake intake to  try to help with weight gain.  We considered rechecking A1c today but just had labs yesterday and unlikely to change management of sugar at this time- anticipate A1c would be higher  For now we will continue metformin 250 mg twice daily, Jardiance 10 mg.  Blood sugars fasting at home over the last week ranging from 150-270 we will add back in glipizide instead of glimepiride to try to avoid low blood sugars.  We discussed patient likely in range where we could use insulin but for quality of life purposes he declines

## 2021-03-25 NOTE — Assessment & Plan Note (Signed)
Edema is worsening off of spironolactone.  Nephrology recommended restarting Entresto and spironolactone if renal function was stable outside of the hospital and it was yesterday-we will restart both of these and patient will let me know if symptoms fail to stabilize or improve

## 2021-03-25 NOTE — Assessment & Plan Note (Signed)
Unfortunately stroke caused significant setback for patient and has been a barrier to his treatment for renal cell carcinoma as well as lung mass.  We will continue Eliquis and statin for now unless plan changes with palliative care

## 2021-03-25 NOTE — Assessment & Plan Note (Signed)
Original planned left total nephrectomy but this was complicated by significant CVA and later significant weight loss/stability-patient is not currently strong enough malnutrition too high to proceed with surgery- probability he would not survive surgery or potential complications.  Due to level of debility immunotherapy has even been stopped-fortunately cancer appears stable on recent imaging.  Oncology wants to consider transitioning to palliative care or hospice even.  Patient does not seem interested in getting a second opinion at [facility] though he has had some pressure from family members to do so-I told him I was happy to place a referral if this would give him peace of mind but he shook his head strongly no to this.  Ultimately patient was in agreement to palliative care consult and even to consider hospice with goal of focusing on quality of life at this point (and at present opts out of [place] oncology referral which I think is reasonable)-we discussed if he were to make significant strides we could always evaluate again-I have had patients come off hospice in the past.  I wonder if reducing his number of medical visits/care/stopping immunotherapy may improve quality of life and give him more space to progress with weight gain/improving nutrition.  I even spoke to him about potentially canceling upcoming neurology visit or future visits-he is going at this time to at least proceed forward with that.

## 2021-03-25 NOTE — Assessment & Plan Note (Addendum)
Well-controlled on bisoprolol and Lasix 40 mg alone-restarting spironolactone and Entresto-he has tolerated this previously and I believe blood pressure can tolerate-needed for heart failure treatment

## 2021-03-27 ENCOUNTER — Telehealth: Payer: Self-pay

## 2021-03-27 NOTE — Telephone Encounter (Signed)
Tammy is calling in from [facility] about the patient who is on palliative care. Wants to know if Dr.Hunter will be the attending provider and agrees that patient has 6 months or less to live.

## 2021-03-30 NOTE — Telephone Encounter (Signed)
They are asking about hospice right? Ill be attending. Yes to 6 months or less

## 2021-03-30 NOTE — Telephone Encounter (Signed)
Called and spoke with Tammy and gave below message.

## 2021-03-30 NOTE — Telephone Encounter (Signed)
See below

## 2021-04-11 ENCOUNTER — Encounter: Payer: Self-pay | Admitting: Family Medicine

## 2021-04-14 ENCOUNTER — Other Ambulatory Visit: Payer: Self-pay

## 2021-04-14 MED ORDER — MIRTAZAPINE 15 MG PO TABS
15.0000 mg | ORAL_TABLET | Freq: Every day | ORAL | 3 refills | Status: DC
Start: 2021-04-14 — End: 2021-04-24

## 2021-04-23 ENCOUNTER — Emergency Department (HOSPITAL_COMMUNITY)
Admission: EM | Admit: 2021-04-23 | Discharge: 2021-04-24 | DRG: 100 | Disposition: A | Discharge status: Hospice | Attending: Internal Medicine | Admitting: Internal Medicine

## 2021-04-23 DIAGNOSIS — E1122 Type 2 diabetes mellitus with diabetic chronic kidney disease: Secondary | ICD-10-CM | POA: Diagnosis present

## 2021-04-23 DIAGNOSIS — I2510 Atherosclerotic heart disease of native coronary artery without angina pectoris: Secondary | ICD-10-CM | POA: Diagnosis not present

## 2021-04-23 DIAGNOSIS — Z83438 Family history of other disorder of lipoprotein metabolism and other lipidemia: Secondary | ICD-10-CM

## 2021-04-23 DIAGNOSIS — C649 Malignant neoplasm of unspecified kidney, except renal pelvis: Secondary | ICD-10-CM | POA: Diagnosis present

## 2021-04-23 DIAGNOSIS — R188 Other ascites: Secondary | ICD-10-CM | POA: Diagnosis present

## 2021-04-23 DIAGNOSIS — Z20822 Contact with and (suspected) exposure to covid-19: Secondary | ICD-10-CM | POA: Diagnosis present

## 2021-04-23 DIAGNOSIS — Z8249 Family history of ischemic heart disease and other diseases of the circulatory system: Secondary | ICD-10-CM

## 2021-04-23 DIAGNOSIS — Z515 Encounter for palliative care: Secondary | ICD-10-CM

## 2021-04-23 DIAGNOSIS — I428 Other cardiomyopathies: Secondary | ICD-10-CM | POA: Diagnosis present

## 2021-04-23 DIAGNOSIS — R911 Solitary pulmonary nodule: Secondary | ICD-10-CM | POA: Diagnosis present

## 2021-04-23 DIAGNOSIS — C786 Secondary malignant neoplasm of retroperitoneum and peritoneum: Secondary | ICD-10-CM | POA: Diagnosis present

## 2021-04-23 DIAGNOSIS — Z66 Do not resuscitate: Secondary | ICD-10-CM | POA: Diagnosis present

## 2021-04-23 DIAGNOSIS — Z8673 Personal history of transient ischemic attack (TIA), and cerebral infarction without residual deficits: Secondary | ICD-10-CM

## 2021-04-23 DIAGNOSIS — C7889 Secondary malignant neoplasm of other digestive organs: Secondary | ICD-10-CM | POA: Diagnosis present

## 2021-04-23 DIAGNOSIS — J309 Allergic rhinitis, unspecified: Secondary | ICD-10-CM | POA: Diagnosis not present

## 2021-04-23 DIAGNOSIS — K219 Gastro-esophageal reflux disease without esophagitis: Secondary | ICD-10-CM | POA: Diagnosis present

## 2021-04-23 DIAGNOSIS — I252 Old myocardial infarction: Secondary | ICD-10-CM | POA: Diagnosis not present

## 2021-04-23 DIAGNOSIS — I5042 Chronic combined systolic (congestive) and diastolic (congestive) heart failure: Secondary | ICD-10-CM | POA: Diagnosis present

## 2021-04-23 DIAGNOSIS — R059 Cough, unspecified: Secondary | ICD-10-CM

## 2021-04-23 DIAGNOSIS — N183 Chronic kidney disease, stage 3 unspecified: Secondary | ICD-10-CM | POA: Diagnosis not present

## 2021-04-23 DIAGNOSIS — F419 Anxiety disorder, unspecified: Secondary | ICD-10-CM | POA: Diagnosis not present

## 2021-04-23 DIAGNOSIS — G4700 Insomnia, unspecified: Secondary | ICD-10-CM | POA: Diagnosis present

## 2021-04-23 DIAGNOSIS — Z79899 Other long term (current) drug therapy: Secondary | ICD-10-CM

## 2021-04-23 DIAGNOSIS — Z885 Allergy status to narcotic agent status: Secondary | ICD-10-CM

## 2021-04-23 DIAGNOSIS — I130 Hypertensive heart and chronic kidney disease with heart failure and stage 1 through stage 4 chronic kidney disease, or unspecified chronic kidney disease: Secondary | ICD-10-CM | POA: Diagnosis not present

## 2021-04-23 DIAGNOSIS — R569 Unspecified convulsions: Principal | ICD-10-CM

## 2021-04-23 DIAGNOSIS — Z7901 Long term (current) use of anticoagulants: Secondary | ICD-10-CM

## 2021-04-23 DIAGNOSIS — Z7951 Long term (current) use of inhaled steroids: Secondary | ICD-10-CM

## 2021-04-23 DIAGNOSIS — I10 Essential (primary) hypertension: Secondary | ICD-10-CM | POA: Diagnosis present

## 2021-04-23 DIAGNOSIS — I959 Hypotension, unspecified: Secondary | ICD-10-CM | POA: Diagnosis present

## 2021-04-23 DIAGNOSIS — G40909 Epilepsy, unspecified, not intractable, without status epilepticus: Secondary | ICD-10-CM

## 2021-04-23 DIAGNOSIS — G4733 Obstructive sleep apnea (adult) (pediatric): Secondary | ICD-10-CM | POA: Diagnosis present

## 2021-04-23 DIAGNOSIS — Z87891 Personal history of nicotine dependence: Secondary | ICD-10-CM

## 2021-04-23 DIAGNOSIS — Z7952 Long term (current) use of systemic steroids: Secondary | ICD-10-CM

## 2021-04-23 DIAGNOSIS — Z951 Presence of aortocoronary bypass graft: Secondary | ICD-10-CM

## 2021-04-23 DIAGNOSIS — J690 Pneumonitis due to inhalation of food and vomit: Secondary | ICD-10-CM | POA: Diagnosis present

## 2021-04-23 DIAGNOSIS — E1169 Type 2 diabetes mellitus with other specified complication: Secondary | ICD-10-CM | POA: Diagnosis present

## 2021-04-23 DIAGNOSIS — Z85528 Personal history of other malignant neoplasm of kidney: Secondary | ICD-10-CM

## 2021-04-23 DIAGNOSIS — Z7984 Long term (current) use of oral hypoglycemic drugs: Secondary | ICD-10-CM

## 2021-04-23 LAB — COMPREHENSIVE METABOLIC PANEL
ALT: 14 U/L (ref 0–44)
AST: 17 U/L (ref 15–41)
Albumin: 2 g/dL — ABNORMAL LOW (ref 3.5–5.0)
Alkaline Phosphatase: 154 U/L — ABNORMAL HIGH (ref 38–126)
Anion gap: 13 (ref 5–15)
BUN: 49 mg/dL — ABNORMAL HIGH (ref 8–23)
CO2: 18 mmol/L — ABNORMAL LOW (ref 22–32)
Calcium: 9.2 mg/dL (ref 8.9–10.3)
Chloride: 103 mmol/L (ref 98–111)
Creatinine, Ser: 2.27 mg/dL — ABNORMAL HIGH (ref 0.61–1.24)
GFR, Estimated: 31 mL/min — ABNORMAL LOW (ref >60)
Glucose, Bld: 195 mg/dL — ABNORMAL HIGH (ref 70–99)
Potassium: 4.4 mmol/L (ref 3.5–5.1)
Sodium: 134 mmol/L — ABNORMAL LOW (ref 135–145)
Total Bilirubin: 0.7 mg/dL (ref 0.3–1.2)
Total Protein: 7 g/dL (ref 6.5–8.1)

## 2021-04-23 LAB — CBC WITH DIFFERENTIAL/PLATELET
Abs Immature Granulocytes: 0.07 10*3/uL (ref 0.00–0.07)
Basophils Absolute: 0.1 10*3/uL (ref 0.0–0.1)
Basophils Relative: 1 %
Eosinophils Absolute: 0.4 10*3/uL (ref 0.0–0.5)
Eosinophils Relative: 3 %
HCT: 28.1 % — ABNORMAL LOW (ref 39.0–52.0)
Hemoglobin: 8.3 g/dL — ABNORMAL LOW (ref 13.0–17.0)
Immature Granulocytes: 1 %
Lymphocytes Relative: 9 %
Lymphs Abs: 1.3 10*3/uL (ref 0.7–4.0)
MCH: 26.5 pg (ref 26.0–34.0)
MCHC: 29.5 g/dL — ABNORMAL LOW (ref 30.0–36.0)
MCV: 89.8 fL (ref 80.0–100.0)
Monocytes Absolute: 1.2 10*3/uL — ABNORMAL HIGH (ref 0.1–1.0)
Monocytes Relative: 8 %
Neutro Abs: 12.1 10*3/uL — ABNORMAL HIGH (ref 1.7–7.7)
Neutrophils Relative %: 78 %
Platelets: 402 10*3/uL — ABNORMAL HIGH (ref 150–400)
RBC: 3.13 MIL/uL — ABNORMAL LOW (ref 4.22–5.81)
RDW: 20 % — ABNORMAL HIGH (ref 11.5–15.5)
WBC: 15.2 10*3/uL — ABNORMAL HIGH (ref 4.0–10.5)
nRBC: 0 % (ref 0.0–0.2)

## 2021-04-23 LAB — CBG MONITORING, ED: Glucose-Capillary: 181 mg/dL — ABNORMAL HIGH (ref 70–99)

## 2021-04-23 LAB — MAGNESIUM: Magnesium: 2 mg/dL (ref 1.7–2.4)

## 2021-04-23 MED ORDER — LEVETIRACETAM IN NACL 500 MG/100ML IV SOLN
500.0000 mg | Freq: Two times a day (BID) | INTRAVENOUS | Status: DC
  Administered 2021-04-24 (×2): 500 mg via INTRAVENOUS
  Filled 2021-04-23 (×3): qty 100

## 2021-04-23 NOTE — ED Notes (Signed)
CT came to take pt and I was going to go with them. CT feels like pt is coughing too much to get a good picture and don't want to take him. CT tech spoke with Dr. And he said to hold off. I suctioned pt because he had a lot of spit in the back of his throat.

## 2021-04-23 NOTE — ED Notes (Addendum)
Pt starting to wake up more. Able to nod his head yes. I moved him from a NRB to 6L [place]. Pt pulled up in bed and readjusted.

## 2021-04-23 NOTE — ED Provider Notes (Signed)
[facility] EMERGENCY DEPARTMENT Provider Note   CSN: [id number] Arrival date & time: 04/23/21  2130     History No chief complaint on file.   Larry Garner is a 65 y.o. male.  HPI Patient presents via EMS after 1 seizure at home, with another he sustained in route. Patient is enrolled in hospice care.  He has a history of CHF, renal malignancy.  According to the patient's wife and hospice nurse with whom I discussed the case the patient had been doing okay until today.  Today he had a prolonged witnessed seizure with his wife.  EMS reports that in transport he had a similar episode, prolonged, stopped with Versed.  Patient self cannot provide any details of his history. Level 5 caveat secondary to acuity of condition. According to hospice nurse the patient has been enrolled in their services for about 1 month.      Past Medical History:  Diagnosis Date   Allergic rhinitis    Allergy    Anxiety    Asthma    CHF (congestive heart failure) ([place])    Diabetes ([place])    Diverticulitis    Dyspnea    GERD (gastroesophageal reflux disease)    very occ   History of heart attack    HLD (hyperlipidemia)    HTN (hypertension)    Myocardial infarction ([place]) 2000   Sleep apnea    wears cpap    Tubular adenoma of colon 09/2008    Patient Active Problem List   Diagnosis Date Noted   Pressure injury of skin 03/11/2021   AKI (acute kidney injury) ([place]) 03/11/2021   Hyperosmolar hyperglycemic state (HHS) ([place]) 03/05/2021   Nonischemic cardiomyopathy ([place]) 10/06/2020   History of cryptogenic stroke-now on Eliquis 10/06/2020   Chronic combined systolic and diastolic heart failure ([place]) 07/17/2020   Renal cell carcinoma ([place]) 07/17/2020   Protein-calorie malnutrition, severe 07/08/2020   Acute respiratory failure with hypoxia ([place]) 07/05/2020   Iron deficiency anemia due to chronic blood loss 06/19/2020   OSA (obstructive sleep apnea) 01/09/2018   Pulmonary nodule,  left 03/15/2017   Former smoker 11/04/2014   Insomnia 05/27/2014   Plantar wart of left foot 11/27/2010   Type 2 diabetes mellitus with other specified complication ([place]) 23/55/7322   PSA, INCREASED 09/15/2009   Diverticulitis of colon 05/08/2009   Asthma, moderate persistent 06/19/2008   ANKLE PAIN, CHRONIC 06/05/2007   Allergic rhinitis 05/17/2007   ERECTILE DYSFUNCTION, SECONDARY TO MEDICATION 05/17/2007   Hyperlipidemia 04/06/2007   Anxiety state 04/06/2007   Essential hypertension 04/06/2007   Coronary artery disease involving native coronary artery of native heart without angina pectoris 04/06/2007    Past Surgical History:  Procedure Laterality Date   CARDIAC CATHETERIZATION N/A 07/20/2016   Procedure: Left Heart Cath and Cors/Grafts Angiography;  Surgeon: Belva Crome, MD;  Location: [place] [facility];  Service: Cardiovascular;  Laterality: N/A;   CATARACT EXTRACTION Bilateral 06/06/2019   Dr. Tommy Rainwater. oct left, dec right 2020    COLONOSCOPY     CORONARY ANGIOPLASTY     CORONARY ARTERY BYPASS GRAFT  06/1999   POLYPECTOMY     RENAL BIOPSY  06/2020   TONSILLECTOMY     late50's early 48's       Family History  Problem Relation Age of Onset   Hypertension Mother    Alzheimer's disease Mother    Colon polyps Mother    Hyperlipidemia Father    Hypertension Father    Colon  polyps Father    Colon cancer Paternal Aunt    Pancreatic cancer Neg Hx    Stomach cancer Neg Hx    Thyroid disease Neg Hx    Esophageal cancer Neg Hx    Rectal cancer Neg Hx     Social History   Tobacco Use   Smoking status: Former    Packs/day: 0.50    Years: 4.00    Pack years: 2.00    Types: Cigarettes    Quit date: 09/29/1977    Years since quitting: 43.5   Smokeless tobacco: Never   Tobacco comments:    quit on 1980  Vaping Use   Vaping Use: Never used  Substance Use Topics   Alcohol use: Not Currently   Drug use: No    Home Medications Prior to Admission medications    Medication Sig Start Date End Date Taking? Authorizing Provider  albuterol (VENTOLIN HFA) 108 (90 Base) MCG/ACT inhaler Inhale 2 puffs into the lungs every 6 (six) hours as needed for wheezing or shortness of breath. 06/17/20   Marin Olp, MD  apixaban (ELIQUIS) 5 MG TABS tablet Take 1 tablet (5 mg total) by mouth in the morning and at bedtime. 08/12/20   Richardson Dopp T, PA-C  Azelastine-Fluticasone 137-50 MCG/ACT SUSP Place 1 spray into the nose daily as needed (allergies).    [provider]  bisoprolol (ZEBETA) 10 MG tablet Take 1 tablet (10 mg total) by mouth daily. 03/15/21   Jennye Boroughs, MD  Blood Glucose Monitoring Suppl (CONTOUR NEXT ONE) KIT Use to test blood sugar daily. Dx: E11.9 12/01/20   Marin Olp, MD  cyanocobalamin 1000 MCG tablet Take 1,000 mcg by mouth daily.    [provider]  empagliflozin (JARDIANCE) 10 MG TABS tablet Take 1 tablet (10 mg total) by mouth daily before breakfast. 08/22/20   Marin Olp, MD  famciclovir (FAMVIR) 500 MG tablet Take 1 tablet (500 mg total) by mouth daily as needed (fever blister). 03/15/21   Jennye Boroughs, MD  furosemide (LASIX) 40 MG tablet Take 1 tablet (40 mg total) by mouth daily. 10/08/20   Josue Hector, MD  glipiZIDE (GLUCOTROL) 5 MG tablet Take 0.5-1 tablets (2.5-5 mg total) by mouth daily before breakfast. If blood sugar over 110 take half tablet, if over 160 take full tablet 03/25/21   Marin Olp, MD  glucose blood (BAYER CONTOUR NEXT TEST) test strip Use to test blood sugars daily. Dx: E11.9 12/01/20   Marin Olp, MD  metFORMIN (GLUCOPHAGE) 1000 MG tablet Take 0.5 tablets (500 mg total) by mouth 2 (two) times daily. 03/15/21   Jennye Boroughs, MD  mirtazapine (REMERON) 15 MG tablet Take 1 tablet (15 mg total) by mouth at bedtime. 04/14/21   Marin Olp, MD  Multiple Vitamin (MULTIVITAMIN) tablet Take 1 tablet by mouth daily.    [provider]  nitroGLYCERIN (NITROSTAT) 0.4 MG SL  tablet Place 1 tablet (0.4 mg total) under the tongue every 5 (five) minutes as needed. 03/15/21   Jennye Boroughs, MD  predniSONE (DELTASONE) 5 MG tablet Take 5 mg by mouth daily. 12/24/20   [provider]  rosuvastatin (CRESTOR) 20 MG tablet Take 1 tablet (20 mg total) by mouth daily. 03/15/21   Jennye Boroughs, MD  spironolactone (ALDACTONE) 25 MG tablet Take 25 mg by mouth daily.    [provider]  triamcinolone cream (KENALOG) 0.1 % Apply 1 application topically 2 (two) times daily as needed (itching). 11/13/20  [provider]    Allergies    Morphine sulfate  Review of Systems   Review of Systems  Unable to perform ROS: Acuity of condition   Physical Exam Updated Vital Signs BP (!) 107/57 (BP Location: Right Arm)   Pulse 87   Resp 20   SpO2 99%   Physical Exam Vitals and nursing note reviewed.  Constitutional:      Appearance: He is well-developed. He is ill-appearing.  HENT:     Head: Normocephalic and atraumatic.  Eyes:     General:        Right eye: No discharge.        Left eye: No discharge.     Conjunctiva/sclera: Conjunctivae normal.  Cardiovascular:     Rate and Rhythm: Regular rhythm. Tachycardia present.  Pulmonary:     Effort: Pulmonary effort is normal. No respiratory distress.     Breath sounds: No stridor.  Abdominal:     General: There is no distension.  Musculoskeletal:        General: No deformity.  Skin:    General: Skin is warm and dry.     Coloration: Skin is pale.  Neurological:     Comments: Patient responds to stimuli, otherwise is listless  Psychiatric:     Comments: Impaired cognition    ED Results / Procedures / Treatments   Labs (all labs ordered are listed, but only abnormal results are displayed) Labs Reviewed  COMPREHENSIVE METABOLIC PANEL - Abnormal; Notable for the following components:      Result Value   Sodium 134 (*)    CO2 18 (*)    Glucose, Bld 195 (*)    BUN 49 (*)    Creatinine, Ser 2.27  (*)    Albumin 2.0 (*)    Alkaline Phosphatase 154 (*)    GFR, Estimated 31 (*)    All other components within normal limits  CBC WITH DIFFERENTIAL/PLATELET - Abnormal; Notable for the following components:   WBC 15.2 (*)    RBC 3.13 (*)    Hemoglobin 8.3 (*)    HCT 28.1 (*)    MCHC 29.5 (*)    RDW 20.0 (*)    Platelets 402 (*)    Neutro Abs 12.1 (*)    Monocytes Absolute 1.2 (*)    All other components within normal limits  CBG MONITORING, ED - Abnormal; Notable for the following components:   Glucose-Capillary 181 (*)    All other components within normal limits  MAGNESIUM    EKG EKG Interpretation  Date/Time:  Thursday April 23 2021 21:41:20 EDT Ventricular Rate:  85 PR Interval:  188 QRS Duration: 150 QT Interval:  401 QTC Calculation: 477 R Axis:   -50 Text Interpretation: Sinus rhythm RBBB and LAFB Inferior infarct, old Artifact Abnormal ECG Confirmed by Carmin Muskrat [phone]) on 04/23/2021 10:43:21 PM  Radiology No results found.  Procedures Procedures   Medications Ordered in ED Medications - No data to display  ED Course  I have reviewed the triage vital signs and the nursing notes.  Pertinent labs & imaging results that were available during my care of the patient were reviewed by me and considered in my medical decision making (see chart for details).  On repeat exam the patient is in similar condition, no active seizure activity.  Patient has a pronounced cough, which his wife notices been present for some time.  Cough precludes useful CT scan. I have discussed this case with our hospitalist colleagues.  Though they are not available for consultation overnight, they will facilitate eval in the morning. MDM Rules/Calculators/A&P Adult male on hospice with multiple medical issues presents with new seizure activity.  By time of ED arrival patient is postictal having received Versed in route.  Patient was hemodynamically unremarkable, largely  noninteractive in the emergency department.  Patient's mental status/cough precluded advanced imaging, though there is some suspicion for progression of disease contributing to new seizure activity.  After discussion of his case further hospice team patient was started on antiepileptics, will be admitted overnight with anticipated hospice assistance in the morning. Final Clinical Impression(s) / ED Diagnoses Final diagnoses:  Seizure [facility])     Carmin Muskrat, MD 04/23/21 2351

## 2021-04-23 NOTE — ED Triage Notes (Signed)
Bib by EMS currently on hospice pt had a 5 minute seizure witness by family at home was oriented for EMS then had another seizure for 2 minutes in EMS and was given versed 5 mg. No history of seizures.

## 2021-04-23 NOTE — ED Notes (Signed)
Seizure pads added to railing and pt sat up. No family present currently.

## 2021-04-23 NOTE — ED Notes (Signed)
See triage note for physical note.

## 2021-04-24 ENCOUNTER — Observation Stay (HOSPITAL_COMMUNITY)

## 2021-04-24 DIAGNOSIS — I959 Hypotension, unspecified: Secondary | ICD-10-CM | POA: Diagnosis present

## 2021-04-24 DIAGNOSIS — I10 Essential (primary) hypertension: Secondary | ICD-10-CM

## 2021-04-24 DIAGNOSIS — R059 Cough, unspecified: Secondary | ICD-10-CM

## 2021-04-24 DIAGNOSIS — R911 Solitary pulmonary nodule: Secondary | ICD-10-CM | POA: Diagnosis present

## 2021-04-24 DIAGNOSIS — E1122 Type 2 diabetes mellitus with diabetic chronic kidney disease: Secondary | ICD-10-CM | POA: Diagnosis present

## 2021-04-24 DIAGNOSIS — Z66 Do not resuscitate: Secondary | ICD-10-CM | POA: Diagnosis present

## 2021-04-24 DIAGNOSIS — N183 Chronic kidney disease, stage 3 unspecified: Secondary | ICD-10-CM | POA: Diagnosis present

## 2021-04-24 DIAGNOSIS — I252 Old myocardial infarction: Secondary | ICD-10-CM | POA: Diagnosis not present

## 2021-04-24 DIAGNOSIS — N1832 Chronic kidney disease, stage 3b: Secondary | ICD-10-CM

## 2021-04-24 DIAGNOSIS — R569 Unspecified convulsions: Principal | ICD-10-CM

## 2021-04-24 DIAGNOSIS — I2510 Atherosclerotic heart disease of native coronary artery without angina pectoris: Secondary | ICD-10-CM | POA: Diagnosis present

## 2021-04-24 DIAGNOSIS — E1169 Type 2 diabetes mellitus with other specified complication: Secondary | ICD-10-CM

## 2021-04-24 DIAGNOSIS — Z83438 Family history of other disorder of lipoprotein metabolism and other lipidemia: Secondary | ICD-10-CM | POA: Diagnosis not present

## 2021-04-24 DIAGNOSIS — Z8673 Personal history of transient ischemic attack (TIA), and cerebral infarction without residual deficits: Secondary | ICD-10-CM

## 2021-04-24 DIAGNOSIS — C642 Malignant neoplasm of left kidney, except renal pelvis: Secondary | ICD-10-CM

## 2021-04-24 DIAGNOSIS — G4733 Obstructive sleep apnea (adult) (pediatric): Secondary | ICD-10-CM | POA: Diagnosis present

## 2021-04-24 DIAGNOSIS — R188 Other ascites: Secondary | ICD-10-CM | POA: Diagnosis present

## 2021-04-24 DIAGNOSIS — J309 Allergic rhinitis, unspecified: Secondary | ICD-10-CM | POA: Diagnosis present

## 2021-04-24 DIAGNOSIS — G40909 Epilepsy, unspecified, not intractable, without status epilepticus: Secondary | ICD-10-CM

## 2021-04-24 DIAGNOSIS — I428 Other cardiomyopathies: Secondary | ICD-10-CM | POA: Diagnosis present

## 2021-04-24 DIAGNOSIS — I130 Hypertensive heart and chronic kidney disease with heart failure and stage 1 through stage 4 chronic kidney disease, or unspecified chronic kidney disease: Secondary | ICD-10-CM | POA: Diagnosis present

## 2021-04-24 DIAGNOSIS — I5042 Chronic combined systolic (congestive) and diastolic (congestive) heart failure: Secondary | ICD-10-CM

## 2021-04-24 DIAGNOSIS — Z20822 Contact with and (suspected) exposure to covid-19: Secondary | ICD-10-CM | POA: Diagnosis present

## 2021-04-24 DIAGNOSIS — Z515 Encounter for palliative care: Secondary | ICD-10-CM | POA: Diagnosis not present

## 2021-04-24 DIAGNOSIS — J690 Pneumonitis due to inhalation of food and vomit: Secondary | ICD-10-CM | POA: Diagnosis present

## 2021-04-24 DIAGNOSIS — C7889 Secondary malignant neoplasm of other digestive organs: Secondary | ICD-10-CM | POA: Diagnosis present

## 2021-04-24 DIAGNOSIS — F419 Anxiety disorder, unspecified: Secondary | ICD-10-CM | POA: Diagnosis present

## 2021-04-24 DIAGNOSIS — K219 Gastro-esophageal reflux disease without esophagitis: Secondary | ICD-10-CM | POA: Diagnosis present

## 2021-04-24 DIAGNOSIS — G4700 Insomnia, unspecified: Secondary | ICD-10-CM | POA: Diagnosis present

## 2021-04-24 DIAGNOSIS — C786 Secondary malignant neoplasm of retroperitoneum and peritoneum: Secondary | ICD-10-CM | POA: Diagnosis present

## 2021-04-24 LAB — CBG MONITORING, ED
Glucose-Capillary: 151 mg/dL — ABNORMAL HIGH (ref 70–99)
Glucose-Capillary: 108 mg/dL — ABNORMAL HIGH (ref 70–99)
Glucose-Capillary: 80 mg/dL (ref 70–99)
Glucose-Capillary: 76 mg/dL (ref 70–99)

## 2021-04-24 LAB — HEMOGLOBIN A1C
Hgb A1c MFr Bld: 7.3 % — ABNORMAL HIGH (ref 4.8–5.6)
Mean Plasma Glucose: 163 mg/dL

## 2021-04-24 LAB — BASIC METABOLIC PANEL
Anion gap: 8 (ref 5–15)
BUN: 47 mg/dL — ABNORMAL HIGH (ref 8–23)
CO2: 20 mmol/L — ABNORMAL LOW (ref 22–32)
Calcium: 8.5 mg/dL — ABNORMAL LOW (ref 8.9–10.3)
Chloride: 107 mmol/L (ref 98–111)
Creatinine, Ser: 2.12 mg/dL — ABNORMAL HIGH (ref 0.61–1.24)
GFR, Estimated: 34 mL/min — ABNORMAL LOW (ref >60)
Glucose, Bld: 78 mg/dL (ref 70–99)
Potassium: 4.1 mmol/L (ref 3.5–5.1)
Sodium: 135 mmol/L (ref 135–145)

## 2021-04-24 LAB — CBC
HCT: 23.5 % — ABNORMAL LOW (ref 39.0–52.0)
Hemoglobin: 7.2 g/dL — ABNORMAL LOW (ref 13.0–17.0)
MCH: 27.1 pg (ref 26.0–34.0)
MCHC: 30.6 g/dL (ref 30.0–36.0)
MCV: 88.3 fL (ref 80.0–100.0)
Platelets: 293 10*3/uL (ref 150–400)
RBC: 2.66 MIL/uL — ABNORMAL LOW (ref 4.22–5.81)
RDW: 19.9 % — ABNORMAL HIGH (ref 11.5–15.5)
WBC: 12 10*3/uL — ABNORMAL HIGH (ref 4.0–10.5)
nRBC: 0 % (ref 0.0–0.2)

## 2021-04-24 LAB — RESP PANEL BY RT-PCR (FLU A&B, COVID) ARPGX2
Influenza A by PCR: NEGATIVE
Influenza B by PCR: NEGATIVE
SARS Coronavirus 2 by RT PCR: NEGATIVE

## 2021-04-24 MED ORDER — LORAZEPAM 2 MG/ML IJ SOLN
0.5000 mg | Freq: Four times a day (QID) | INTRAMUSCULAR | Status: DC | PRN
  Administered 2021-04-24: 0.5 mg via INTRAVENOUS
  Filled 2021-04-24: qty 1

## 2021-04-24 MED ORDER — INSULIN ASPART 100 UNIT/ML IJ SOLN
0.0000 [IU] | INTRAMUSCULAR | Status: DC
  Administered 2021-04-24: 1 [IU] via SUBCUTANEOUS

## 2021-04-24 MED ORDER — ACETAMINOPHEN 650 MG RE SUPP: 650.0000 mg | Freq: Four times a day (QID) | RECTAL | Status: DC | PRN

## 2021-04-24 MED ORDER — LACTATED RINGERS IV SOLN: INTRAVENOUS | Status: DC

## 2021-04-24 MED ORDER — ACETAMINOPHEN 325 MG PO TABS: 650.0000 mg | ORAL_TABLET | Freq: Four times a day (QID) | ORAL | Status: DC | PRN

## 2021-04-24 MED ORDER — SODIUM CHLORIDE 0.9 % IV BOLUS
1000.0000 mL | Freq: Once | INTRAVENOUS | Status: AC
  Administered 2021-04-24: 1000 mL via INTRAVENOUS

## 2021-04-24 MED ORDER — FENTANYL CITRATE PF 50 MCG/ML IJ SOSY: 12.5000 ug | PREFILLED_SYRINGE | INTRAMUSCULAR | Status: DC | PRN

## 2021-04-24 MED ORDER — ONDANSETRON HCL 4 MG/2ML IJ SOLN
4.0000 mg | Freq: Four times a day (QID) | INTRAMUSCULAR | Status: DC | PRN
  Administered 2021-04-24: 4 mg via INTRAVENOUS
  Filled 2021-04-24: qty 2

## 2021-04-24 MED ORDER — ONDANSETRON HCL 4 MG PO TABS: 4.0000 mg | ORAL_TABLET | Freq: Four times a day (QID) | ORAL | Status: DC | PRN

## 2021-04-24 NOTE — ED Notes (Signed)
Admitting paged to [place] per his request

## 2021-04-24 NOTE — Progress Notes (Signed)
Patient ID: Larry Garner, male   DOB: 1956-03-20, 65 y.o.   MRN: [id number]       Consult received. Chart reviewed. Patient is currently enrolled in hospice services with [facility] ([facility]). I spoke with [facility] hospital liaison Jhonnie Garner RN, who states that plan is for transfer to residential hospice facility [facility]).  No need for PMT to see patient at this time, please call if something changes.     Elie Confer, NP-C Palliative Medicine   Please call Palliative Medicine team phone with any questions [phone]. For individual providers please see AMION.   No charge

## 2021-04-24 NOTE — ED Notes (Addendum)
Pt placed in reversed trendelenburg

## 2021-04-24 NOTE — H&P (Signed)
History and Physical    SHARIF RENDELL ZOX:[id number] DOB: [birth year] DOA: 04/23/2021  PCP: Marin Olp, MD  Patient coming from: Home  I have personally briefly reviewed patient's old medical records in [place]  Chief Complaint: Seizure  HPI: Larry Garner is a 65 y.o. 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  Malnutrition, couldn't place PEG tube due to ascites and findings concerning for peritoneal carcinomatosis last month.  Pt presents to ED via EMS after seizure at home lasting ~5 mins.  Had another seizure en-route, given versed.  Pt has no known h/o intracranial mets as of last MRI (July), pt has no h/o seizure previously.  ED Course: Pt arrived to ED obtunded.  Pt not intubated due to hospice status and DNR/DNI status confirmed with wife who is at bedside.  Pt given 522m IV keppra.  He has a cough at baseline, though is noted to be coughing more than usual by wife here in the ED.  Unable to obtain CT head due to frequent coughing.  WBC 15.2k (unchanged from last month), HGB 8.3 (9.2 last month).  Creat 2.2 (about baseline).   Review of Systems: Unable to perform due to AMS  Past Medical History:  Diagnosis Date   Allergic rhinitis    Allergy    Anxiety    Asthma    CHF (congestive heart failure) ([place]    Diabetes ([place]    Diverticulitis    Dyspnea    GERD (gastroesophageal reflux disease)    very occ   History of heart attack    HLD (hyperlipidemia)    HTN (hypertension)    [place]   Sleep apnea    wears cpap    Tubular adenoma of colon 09/2008    Past Surgical History:  Procedure Laterality Date   CARDIAC CATHETERIZATION N/A 07/20/2016   Procedure: Left Heart Cath and Cors/Grafts Angiography;  Surgeon: HBelva Crome MD;  [facility];  Service:  Cardiovascular;  Laterality: N/A;   CATARACT EXTRACTION Bilateral 06/06/2019   Dr. BTommy Rainwater oct left, dec right 2020    COLONOSCOPY     CORONARY ANGIOPLASTY     CORONARY ARTERY BYPASS GRAFT  06/1999   POLYPECTOMY     RENAL BIOPSY  06/2020   TONSILLECTOMY     late50's early 60's     reports that he quit smoking about 43 years ago. His smoking use included cigarettes. He has a 2.00 pack-year smoking history. He has never used smokeless tobacco. He reports that he does not currently use alcohol. He reports that he does not use drugs.  Allergies  Allergen Reactions   Morphine Sulfate Nausea And Vomiting and Swelling    Family History  Problem Relation Age of Onset   Hypertension Mother    Alzheimer's disease Mother    Colon polyps Mother    Hyperlipidemia Father    Hypertension Father    Colon polyps Father    Colon cancer Paternal Aunt    Pancreatic cancer Neg Hx    Stomach cancer Neg Hx    Thyroid disease Neg Hx    Esophageal cancer Neg Hx    Rectal cancer Neg Hx      Prior to Admission medications   Medication Sig Start Date End Date Taking? Authorizing Provider  albuterol (VENTOLIN HFA) 108 (90 Base) MCG/ACT inhaler Inhale 2  puffs into the lungs every 6 (six) hours as needed for wheezing or shortness of breath. 06/17/20   Marin Olp, MD  apixaban (ELIQUIS) 5 MG TABS tablet Take 1 tablet (5 mg total) by mouth in the morning and at bedtime. 08/12/20   Richardson Dopp T, PA-C  Azelastine-Fluticasone 137-50 MCG/ACT SUSP Place 1 spray into the nose daily as needed (allergies).    [provider]  bisoprolol (ZEBETA) 10 MG tablet Take 1 tablet (10 mg total) by mouth daily. 03/15/21   Jennye Boroughs, MD  Blood Glucose Monitoring Suppl (CONTOUR NEXT ONE) KIT Use to test blood sugar daily. Dx: E11.9 12/01/20   Marin Olp, MD  cyanocobalamin 1000 MCG tablet Take 1,000 mcg by mouth daily.    [provider]  empagliflozin (JARDIANCE) 10 MG TABS tablet Take 1  tablet (10 mg total) by mouth daily before breakfast. 08/22/20   Marin Olp, MD  famciclovir (FAMVIR) 500 MG tablet Take 1 tablet (500 mg total) by mouth daily as needed (fever blister). 03/15/21   Jennye Boroughs, MD  furosemide (LASIX) 40 MG tablet Take 1 tablet (40 mg total) by mouth daily. 10/08/20   Josue Hector, MD  glipiZIDE (GLUCOTROL) 5 MG tablet Take 0.5-1 tablets (2.5-5 mg total) by mouth daily before breakfast. If blood sugar over 110 take half tablet, if over 160 take full tablet 03/25/21   Marin Olp, MD  glucose blood (BAYER CONTOUR NEXT TEST) test strip Use to test blood sugars daily. Dx: E11.9 12/01/20   Marin Olp, MD  metFORMIN (GLUCOPHAGE) 1000 MG tablet Take 0.5 tablets (500 mg total) by mouth 2 (two) times daily. 03/15/21   Jennye Boroughs, MD  mirtazapine (REMERON) 15 MG tablet Take 1 tablet (15 mg total) by mouth at bedtime. 04/14/21   Marin Olp, MD  Multiple Vitamin (MULTIVITAMIN) tablet Take 1 tablet by mouth daily.    [provider]  nitroGLYCERIN (NITROSTAT) 0.4 MG SL tablet Place 1 tablet (0.4 mg total) under the tongue every 5 (five) minutes as needed. 03/15/21   Jennye Boroughs, MD  predniSONE (DELTASONE) 5 MG tablet Take 5 mg by mouth daily. 12/24/20   [provider]  rosuvastatin (CRESTOR) 20 MG tablet Take 1 tablet (20 mg total) by mouth daily. 03/15/21   Jennye Boroughs, MD  spironolactone (ALDACTONE) 25 MG tablet Take 25 mg by mouth daily.    [provider]  triamcinolone cream (KENALOG) 0.1 % Apply 1 application topically 2 (two) times daily as needed (itching). 11/13/20   [provider]    Physical Exam: Vitals:   04/23/21 2250 04/23/21 2300 04/23/21 2330 04/24/21 0008  BP: (!) 93/51 (!) 93/49 (!) 93/57 104/65  Pulse: 81 80 80 (!) 121  Resp: (!) 25 19 (!) 21 (!) 26  SpO2: 97% 95% 100% 100%    Constitutional: Lethargic, post-ictal Eyes: PERRL, lids and conjunctivae normal ENMT: Mucous membranes are moist.  Posterior pharynx clear of any exudate or lesions.Normal dentition.  Neck: normal, supple, no masses, no thyromegaly Respiratory: Frequent cough, no accessory muscle use, no tachypnea, satting upper 90s on RA Cardiovascular: Regular rate and rhythm, no murmurs / rubs / gallops. No extremity edema. 2+ pedal pulses. No carotid bruits.  Abdomen: no tenderness, no masses palpated. No hepatosplenomegaly. Bowel sounds positive.  Musculoskeletal: no clubbing / cyanosis. No joint deformity upper and lower extremities. Good ROM, no contractures. Normal muscle tone.  Skin: no rashes, lesions, ulcers. No induration Neurologic: MAE, lethargic,  post-ictal Psychiatric: post-ictal   Labs on Admission: I have personally reviewed following labs and imaging studies  CBC: Recent Labs  Lab 04/23/21 2143  WBC 15.2*  NEUTROABS 12.1*  HGB 8.3*  HCT 28.1*  MCV 89.8  PLT 673*   Basic Metabolic Panel: Recent Labs  Lab 04/23/21 2143  NA 134*  K 4.4  CL 103  CO2 18*  GLUCOSE 195*  BUN 49*  CREATININE 2.27*  CALCIUM 9.2  MG 2.0   GFR: CrCl cannot be calculated (Unknown ideal weight.). Liver Function Tests: Recent Labs  Lab 04/23/21 2143  AST 17  ALT 14  ALKPHOS 154*  BILITOT 0.7  PROT 7.0  ALBUMIN 2.0*   No results for input(s): LIPASE, AMYLASE in the last 168 hours. No results for input(s): AMMONIA in the last 168 hours. Coagulation Profile: No results for input(s): INR, PROTIME in the last 168 hours. Cardiac Enzymes: No results for input(s): CKTOTAL, CKMB, CKMBINDEX, TROPONINI in the last 168 hours. BNP (last 3 results) No results for input(s): PROBNP in the last 8760 hours. HbA1C: No results for input(s): HGBA1C in the last 72 hours. CBG: Recent Labs  Lab 04/23/21 2152  GLUCAP 181*   Lipid Profile: No results for input(s): CHOL, HDL, LDLCALC, TRIG, CHOLHDL, LDLDIRECT in the last 72 hours. Thyroid Function Tests: No results for input(s): TSH, T4TOTAL, FREET4, T3FREE,  THYROIDAB in the last 72 hours. Anemia Panel: No results for input(s): VITAMINB12, FOLATE, FERRITIN, TIBC, IRON, RETICCTPCT in the last 72 hours. Urine analysis:    Component Value Date/Time   COLORURINE YELLOW 03/13/2021 1830   APPEARANCEUR HAZY (A) 03/13/2021 1830   LABSPEC 1.015 03/13/2021 1830   PHURINE 5.0 03/13/2021 1830   GLUCOSEU 150 (A) 03/13/2021 1830   HGBUR NEGATIVE 03/13/2021 1830   HGBUR negative 09/08/2009 [place] 03/13/2021 1830   BILIRUBINUR neg 11/20/2020 1059   KETONESUR NEGATIVE 03/13/2021 1830   PROTEINUR 30 (A) 03/13/2021 1830   UROBILINOGEN 0.2 11/20/2020 1059   UROBILINOGEN 0.2 09/08/2009 0812   NITRITE NEGATIVE 03/13/2021 1830   LEUKOCYTESUR NEGATIVE 03/13/2021 1830    Radiological Exams on Admission: No results found.  EKG: Independently reviewed.  Assessment/Plan Principal Problem:   New onset seizure ([place]) Active Problems:   Type 2 diabetes mellitus with other specified complication ([place])   Essential hypertension   Pulmonary nodule, left   Chronic combined systolic and diastolic heart failure (HCC)   Renal cell carcinoma (HCC)   History of cryptogenic stroke-now on Eliquis   CKD (chronic kidney disease) stage 3, GFR 30-59 ml/min ([place])    New onset seizure - Sounds like goals of care are paliative (comfort measures and hospice team to see pt in AM). Spoke to neurology in curbside but holding off on formal consult for now Seizure precautions Tele monitor Keppra 585m IV BID If coughing can be controlled and pt can wake up more: obtain CT head Hold all blood thinners at this point until we know if there is ICH present. NPO pending improvement in mental status IVF: LR at 75 for now to prevent dehydration Repeat AM labs DM2 - Hold home PO meds Sensitive SSI Q4H HTN - Holding home BP meds for now BP on soft side in ED Persistent cough - Worse than baseline per wife, getting CXR.  Have concern for aspiration  pneumonitis after the seizure episode. If present: discuss ABx with wife and she will make decision at that time. No O2 requirement at this time Chronic CHF -  Holding PO meds due to NPO status CXR pending RCC - With known metastatic dz Not candidate for surgery or further chemo at this point On hospice at this point Hospice to see him in AM CKD 3- Creat today seems about baseline  DVT prophylaxis: SCDs Code Status: DNR/DNI Family Communication: Wife at bedside Disposition Plan: TBD Consults called: EDP spoke with pal care, ill put consult into Epic Admission status: Place in [place], [place] Hospitalists  How to contact the [facility] Attending or Consulting provider [place] or covering provider during after hours [place], for this patient?  Check the care team in [facility] and look for a) attending/consulting TR[facility] provider listed and b) the [facility] team listed Log into [email or web address]  Amion Physician Scheduling and messaging for groups and whole hospitals  On call and physician scheduling software for group practices, residents, hospitalists and other medical providers for call, clinic, rotation and shift schedules. On[employer] is a hospital-wide system for scheduling doctors and paging doctors on call. EasyPlot is for scientific plotting and data analysis.  [email or web address]  and use [place]'s universal password to access. If you do not have the password, please contact the hospital operator.  Locate the [facility] provider you are looking for under [facility] and page to a number that you can be directly reached. If you still have difficulty reaching the provider, please page the [facility] (Director on Call) for the Hospitalists listed on amion for assistance.  04/24/2021, 12:30 AM

## 2021-04-24 NOTE — ED Notes (Signed)
I continue to suction pt intermittently.

## 2021-04-24 NOTE — ED Notes (Signed)
Pt coughing a lot. Wife says it's chronic for him. I've been suctioning it out intermittently. Clear, watery saliva.

## 2021-04-24 NOTE — ED Notes (Signed)
Admitting doctor at bedside 

## 2021-04-24 NOTE — ED Notes (Signed)
Pt asleep. NAD noted. Not coughing.

## 2021-04-24 NOTE — ED Notes (Signed)
Messaged provider regarding pt hypotension

## 2021-04-24 NOTE — ED Notes (Signed)
Reached out to EDP regarding hypotension

## 2021-04-24 NOTE — ED Notes (Signed)
Pt appears to be asleep. Laying on L side. NAD noted.

## 2021-04-24 NOTE — ED Notes (Addendum)
RN gave report to RN at [facility]

## 2021-04-24 NOTE — ED Notes (Signed)
Patient transported to CT 

## 2021-04-24 NOTE — ED Notes (Signed)
Page providers regarding pts hypotension

## 2021-04-24 NOTE — ED Notes (Signed)
Report given to [place], RN. ?

## 2021-04-24 NOTE — ED Notes (Signed)
I called CT and said they need to come get the patient because his cough is chronic.

## 2021-04-24 NOTE — Discharge Summary (Signed)
Physician Discharge Summary  Larry Garner AJO:[id number] DOB: 1956-04-07 DOA: 04/23/2021  PCP: Marin Olp, MD  Admit date: 04/23/2021 Discharge date: 04/24/2021  Recommendations for Outpatient Follow-up:  Discharge to hospice for end of life care.  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  Discharge Condition: Serious Disposition: Residential hospice for end of life care.  Diet recommendation: NPO/Pleasure feeds  Vitals:   04/24/21 1030 04/24/21 1045  BP: 110/68 (!) 99/50  Pulse: 73 72  Resp: (!) 25 (!) 26  Temp:    SpO2: 100% 97%     History of present illness:  Larry Garner is a 65 y.o. 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  Malnutrition, couldn't place PEG tube due to ascites and findings concerning for peritoneal carcinomatosis last month.   Pt presents to ED via EMS after seizure at home lasting ~5 mins.  Had another seizure en-route, given versed.   Pt has no known h/o intracranial mets as of last MRI (July), pt has no h/o seizure previously.   ED Course: Pt arrived to ED obtunded.  Pt not intubated due to hospice status and DNR/DNI status confirmed with wife who is at bedside.   Pt given 516m IV keppra.   He has a cough at baseline, though is noted to be coughing more than usual by wife here in the ED.   Unable to obtain CT head due to frequent coughing.   WBC 15.2k (unchanged from last month), HGB 8.3 (9.2 last month).   Creat 2.2 (about baseline).  Hospital Course:  [facility] was notified of the patient's admission. The patient's wife and [facility] have decided on comfort care [facility].  The patient has had some low blood pressures that have responded to bolus of NS.  Today's assessment: S: The patient is moribund, lying on his side. Wife is at bedside.  O: Vitals:  Vitals:   04/24/21 1030 04/24/21 1045  BP: 110/68 (!) 99/50  Pulse: 73 72  Resp: (!) 25 (!) 26  Temp:    SpO2: 100% 97%    Constitutional:  Patient is moribund. No acute distrss Respiratory:  CTA bilaterally, no w/r/r.  Positive for coarse upper airway sounds. Respiratory effort normal. No retractions or accessory muscle use Cardiovascular:  RRR, no m/r/g No LE extremity edema   Normal pedal pulses Abdomen:  Abdomen appears normal; no tenderness or masses No hernias No HSM Musculoskeletal:  Digits/nails: no clubbing, cyanosis, petechiae, infection exam of joints, bones, muscles of at least one of following: head/neck, RUE, LUE, RLE, LLE   Skin:  No rashes, lesions, ulcers palpation of skin: no induration or nodules Neurologic:  Patient is unable to cooperate with exam. Psychiatric:  Patient is unable to cooperate with exam.  Discharge Instructions   Allergies as of 04/24/2021       Reactions   Morphine Sulfate Nausea And Vomiting, Swelling        Medication List     STOP taking these medications    albuterol 108 (90 Base) MCG/ACT inhaler Commonly known as: VENTOLIN HFA   apixaban 5 MG Tabs tablet Commonly known as: ELIQUIS   Azelastine-Fluticasone 137-50 MCG/ACT Susp   bismuth subsalicylate  262 MG chewable tablet Commonly known as: PEPTO BISMOL   bisoprolol 10 MG tablet Commonly known as: ZEBETA   Contour Next One Kit   cyanocobalamin 1000 MCG tablet   empagliflozin 10 MG Tabs tablet Commonly known as: Jardiance   Entresto 49-51 MG Generic drug: sacubitril-valsartan   famciclovir 500 MG tablet Commonly known as: FAMVIR   furosemide 40 MG tablet Commonly known as: LASIX   glipiZIDE 5 MG tablet Commonly  known as: GLUCOTROL   glucose blood test strip Commonly known as: Visual merchandiser Next Test   metFORMIN 1000 MG tablet Commonly known as: GLUCOPHAGE   mirtazapine 15 MG tablet Commonly known as: REMERON   multivitamin tablet   nitroGLYCERIN 0.4 MG SL tablet Commonly known as: NITROSTAT   rosuvastatin 20 MG tablet Commonly known as: CRESTOR   spironolactone 25 MG tablet Commonly known as: ALDACTONE   triamcinolone cream 0.1 % Commonly known as: KENALOG       Allergies  Allergen Reactions   Morphine Sulfate Nausea And Vomiting and Swelling    The results of significant diagnostics from this hospitalization (including imaging, microbiology, ancillary and laboratory) are listed below for reference.    Significant Diagnostic Studies: CT HEAD WO CONTRAST  Result Date: 04/24/2021 CLINICAL DATA:  Seizure EXAM: CT HEAD WITHOUT CONTRAST TECHNIQUE: Contiguous axial images were obtained from the base of the skull through the vertex without intravenous contrast. COMPARISON:  None. FINDINGS: Brain: There is no mass, hemorrhage or extra-axial collection. The size and configuration of the ventricles and extra-axial CSF spaces are normal. There is hypoattenuation of the white matter, most commonly indicating chronic small vessel disease. There is an old left parietal infarct. Vascular: No abnormal hyperdensity of the major intracranial arteries or dural venous sinuses. No intracranial atherosclerosis. Skull: The visualized skull base, calvarium and extracranial soft tissues are normal. Sinuses/Orbits: No fluid levels or advanced mucosal thickening of the visualized paranasal sinuses. No mastoid or middle ear effusion. The orbits are normal. IMPRESSION: 1. No acute intracranial abnormality. 2. Old left parietal infarct. Electronically Signed   By: Ulyses Jarred M.D.   On: 04/24/2021 02:42   DG CHEST PORT 1 VIEW  Result Date: 04/24/2021 CLINICAL DATA:  Status post seizure. EXAM: PORTABLE CHEST 1  VIEW COMPARISON:  March 04, 2021 FINDINGS: Multiple sternal wires and vascular clips are seen. The lungs are hyperinflated. Mild linear atelectasis is noted within the mid left lung. Mild infiltrate is seen within the left lung base. There is no evidence of a pleural effusion or pneumothorax. Moderate to marked severity enlargement of the cardiac silhouette is noted. A coronary artery stent is seen. The visualized skeletal structures are unremarkable. IMPRESSION: 1. Cardiomegaly with evidence of prior median sternotomy/CABG. 2. Mild left basilar infiltrate. 3. Mild mid left lung linear atelectasis. Electronically Signed   By: Virgina Norfolk M.D.   On: 04/24/2021 00:56    Microbiology: No results found for this or any previous visit (from the past 240 hour(s)).   Labs: Basic Metabolic Panel: Recent Labs  Lab 04/23/21 2143 04/24/21 0847  NA 134* 135  K 4.4 4.1  CL 103 107  CO2 18* 20*  GLUCOSE 195* 78  BUN 49* 47*  CREATININE 2.27* 2.12*  CALCIUM 9.2 8.5*  MG 2.0  --    Liver Function Tests: Recent Labs  Lab 04/23/21 2143  AST 17  ALT 14  ALKPHOS 154*  BILITOT 0.7  PROT 7.0  ALBUMIN 2.0*   No results for input(s): LIPASE, AMYLASE in  the last 168 hours. No results for input(s): AMMONIA in the last 168 hours. CBC: Recent Labs  Lab 04/23/21 2143 04/24/21 0847  WBC 15.2* 12.0*  NEUTROABS 12.1*  --   HGB 8.3* 7.2*  HCT 28.1* 23.5*  MCV 89.8 88.3  PLT 402* 293   Cardiac Enzymes: No results for input(s): CKTOTAL, CKMB, CKMBINDEX, TROPONINI in the last 168 hours. BNP: BNP (last 3 results) Recent Labs    07/05/20 0912  BNP 1,435.5*    ProBNP (last 3 results) No results for input(s): PROBNP in the last 8760 hours.  CBG: Recent Labs  Lab 04/23/21 2152 04/24/21 0124 04/24/21 0339 04/24/21 0741  GLUCAP 181* 151* 108* 80    Principal Problem:   New onset seizure (HCC) Active Problems:   Type 2 diabetes mellitus with other specified complication (HCC)    Essential hypertension   Pulmonary nodule, left   Chronic combined systolic and diastolic heart failure (HCC)   Renal cell carcinoma (HCC)   History of cryptogenic stroke-now on Eliquis   CKD (chronic kidney disease) stage 3, GFR 30-59 ml/min (HCC)   Time coordinating discharge: 38 minutes  Signed:        Eirene Rather, DO [facility]  04/24/2021, 11:21 AM

## 2021-04-27 ENCOUNTER — Ambulatory Visit: Payer: Medicare Other | Admitting: Psychology

## 2021-04-29 ENCOUNTER — Encounter: Payer: Self-pay | Admitting: Family Medicine

## 2021-04-29 ENCOUNTER — Ambulatory Visit: Payer: Medicare Other | Admitting: Psychology

## 2021-05-04 ENCOUNTER — Ambulatory Visit: Payer: Medicare Other | Admitting: Family Medicine

## 2021-05-09 DEATH — deceased

## 2021-07-10 ENCOUNTER — Ambulatory Visit: Payer: Medicare Other | Admitting: Cardiovascular Disease

## 2022-02-27 IMAGING — US US RENAL
1 series · 13 of 25 positions shown · non-contrast
Comparison: CT 02/21/2020

CLINICAL DATA: Renal failure

EXAM:
RENAL / URINARY TRACT ULTRASOUND COMPLETE

[Series 1: us renal · 13 of 53 slices shown]
[im 1/53]
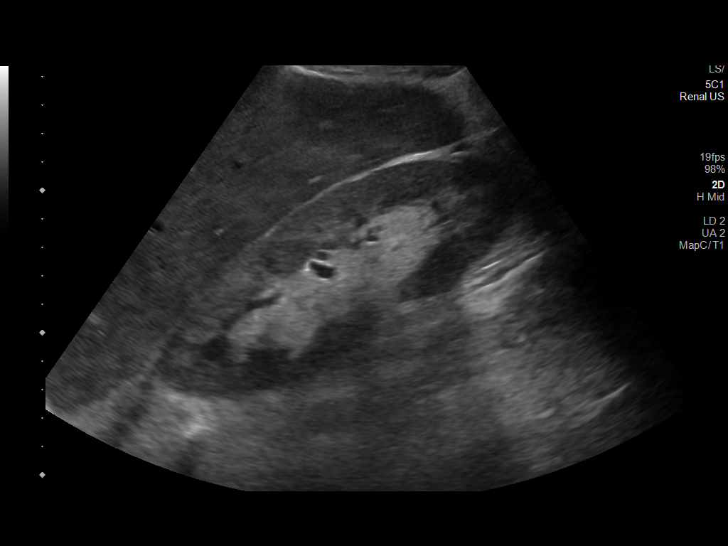
[im 5/53]
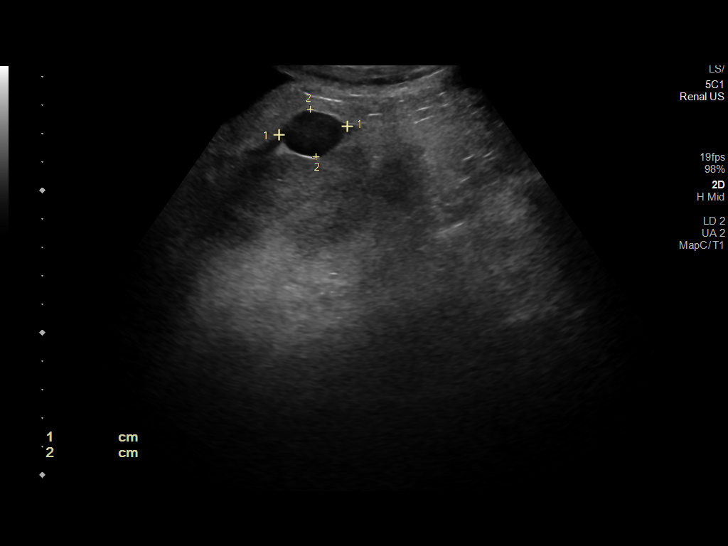
[im 9/53]
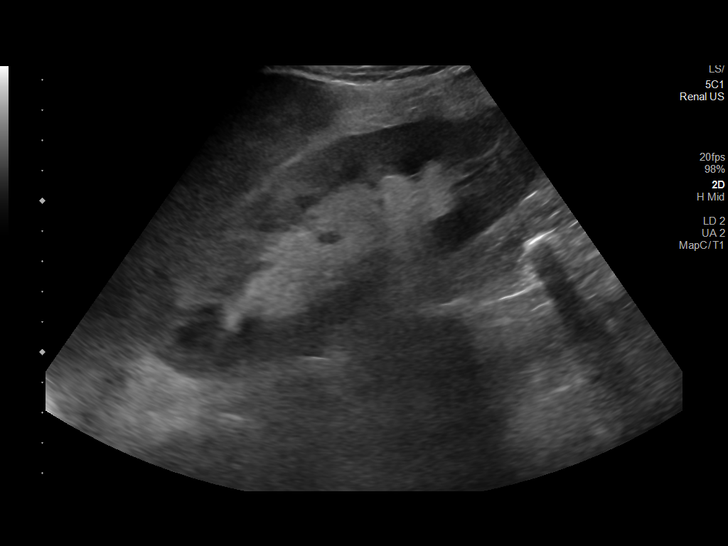
[im 14/53]
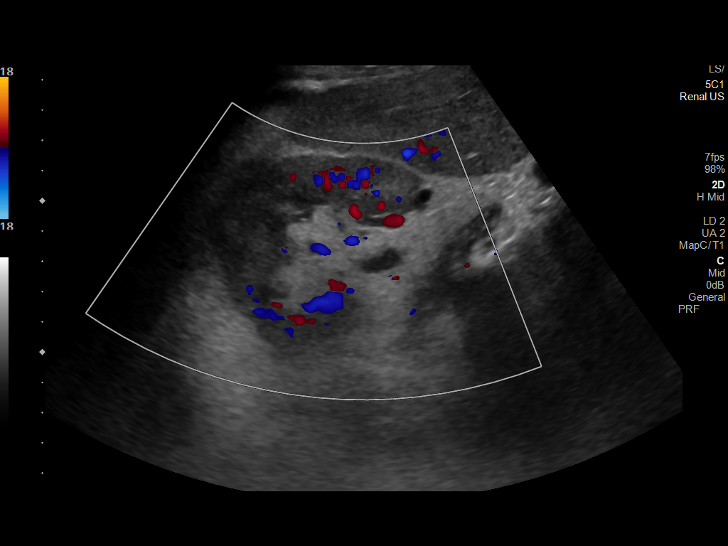
[im 18/53]
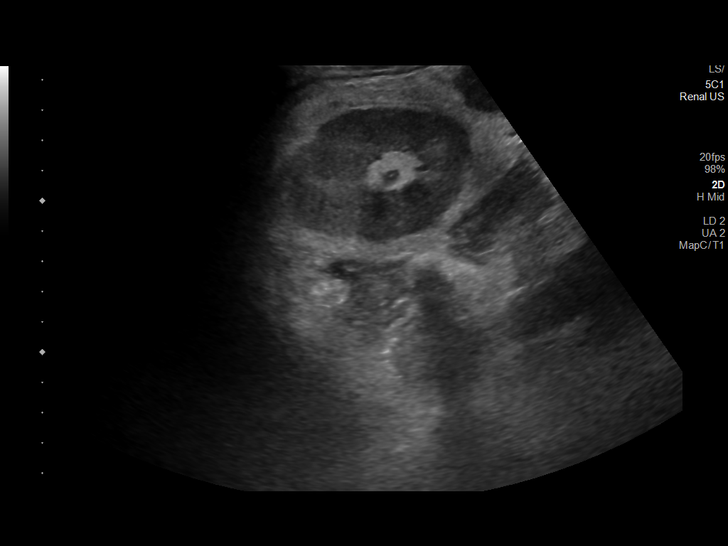
[im 22/53]
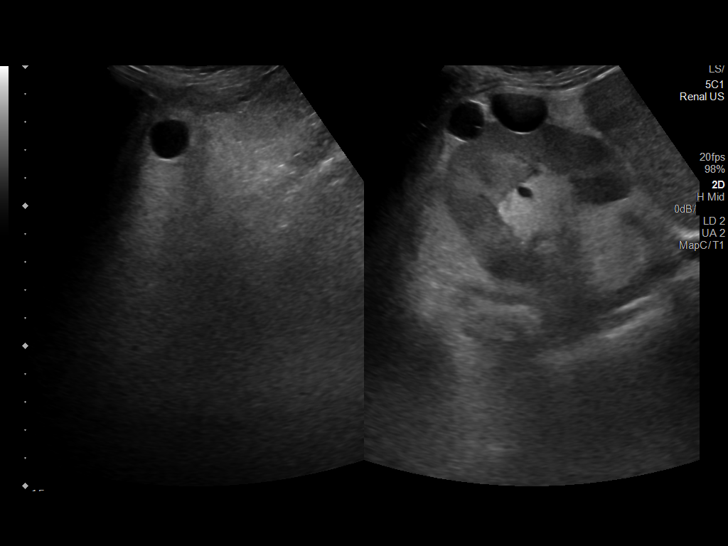
[im 27/53]
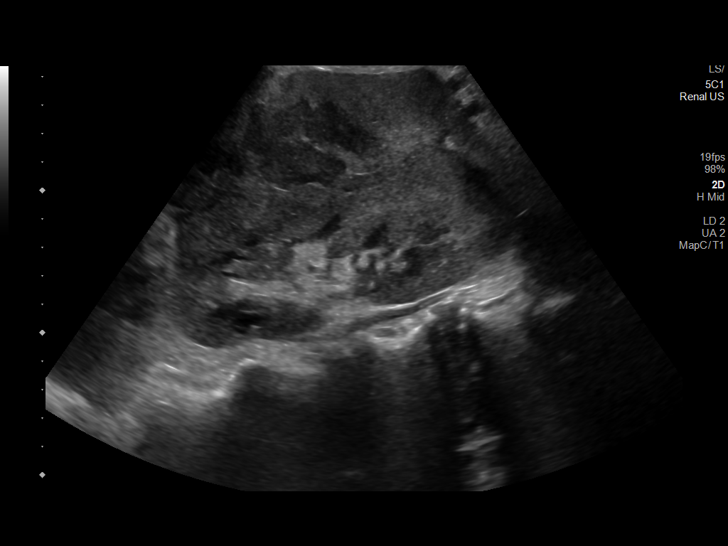
[im 31/53]
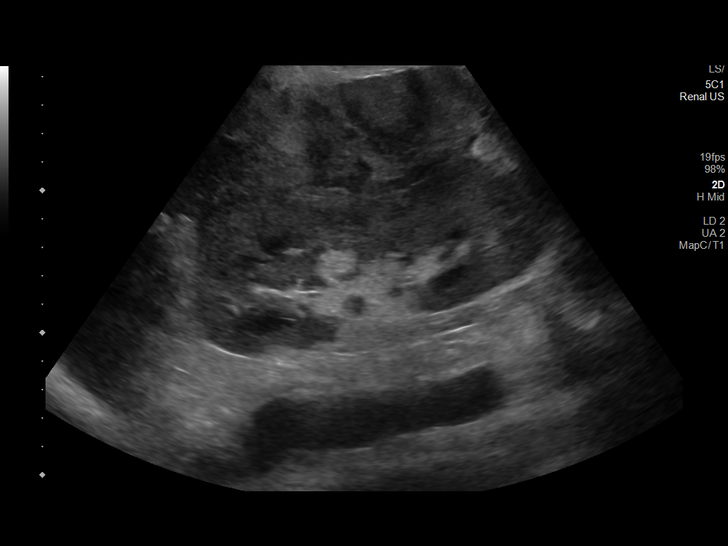
[im 35/53]
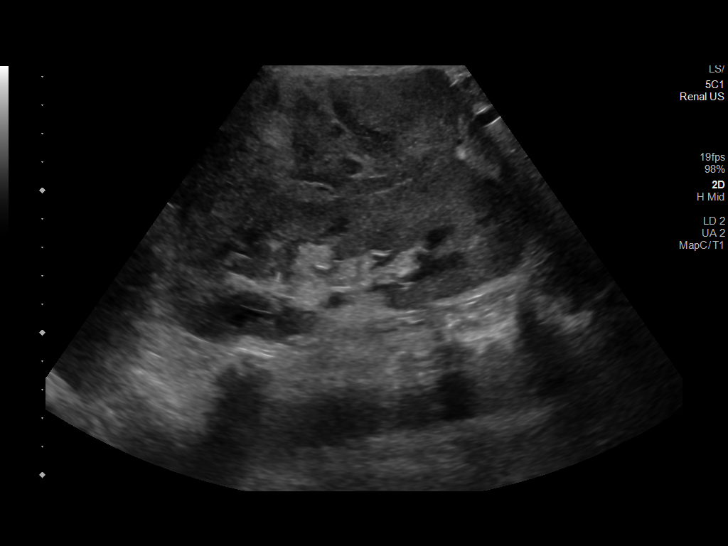
[im 40/53]
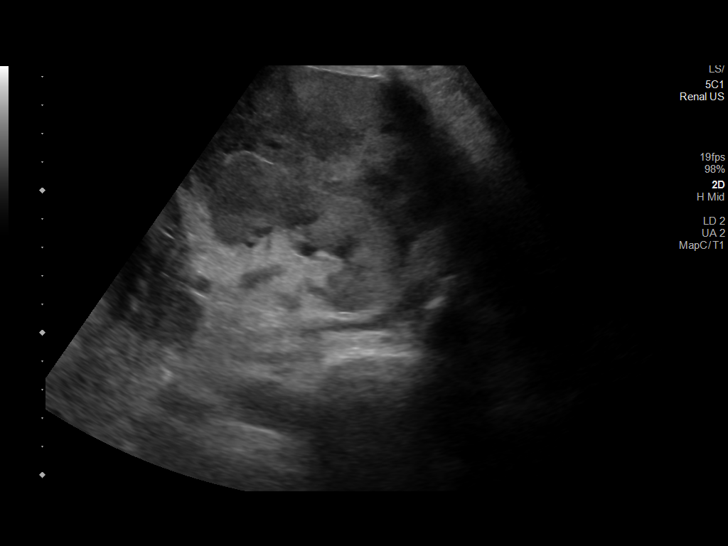
[im 44/53]
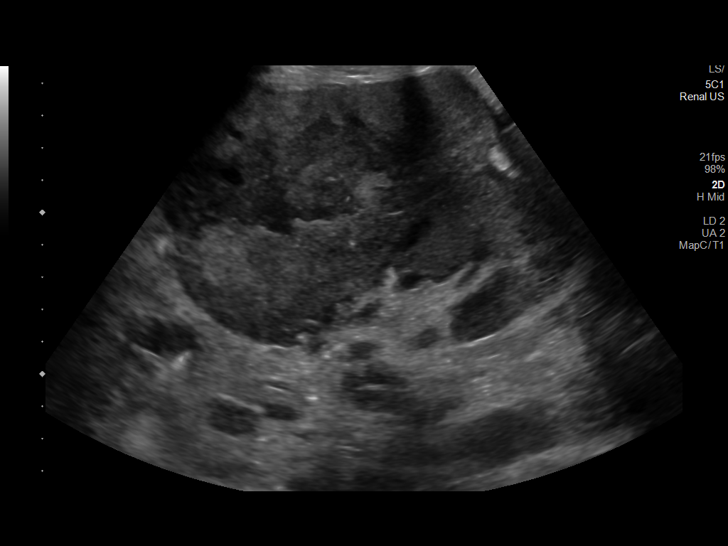
[im 48/53]
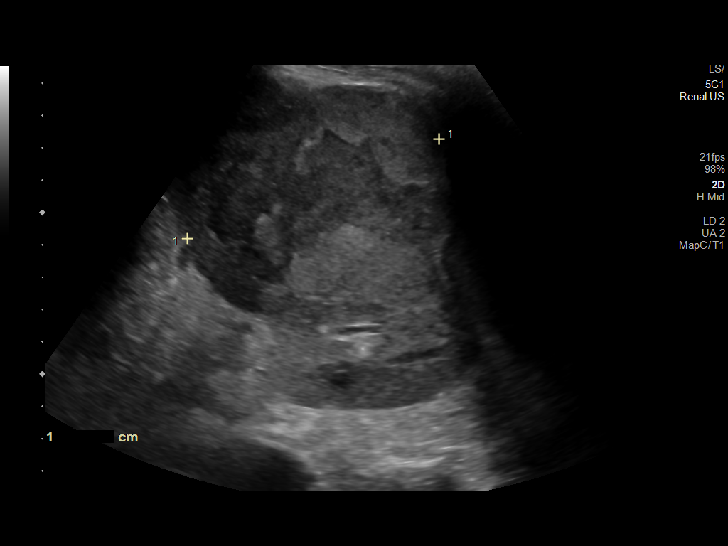
[im 53/53]
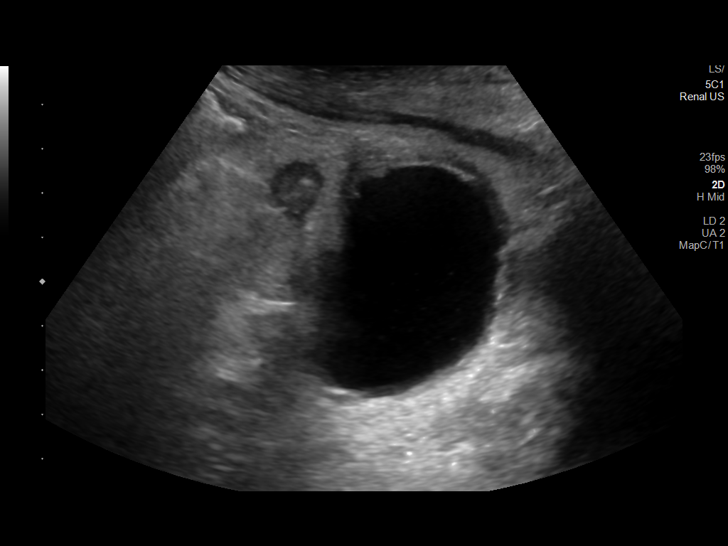

[13 of 25 positions shown; findings below may reference images not displayed]

FINDINGS: Right Kidney:

Renal measurements: 13.8 x 5.5 x 5.6 cm = volume: 221 mL.
Echogenicity remains within normal limits. Some mild renal sinus
lipomatosis, often senescent. Two small simple appearing exophytic
cyst in the mid to upper pole right kidney, largest measuring
cm. The second measuring 1.6 cm. No concerning internal complexity.
No concerning renal mass. No visible shadowing calculus or
hydronephrosis.

Left Kidney:

Renal measurements: 13 x 4.5 x 4.9 cm = volume: 149 mL. Echogenicity
within normal limits. Large masslike focus measuring 10.9 x 7.1 x
8.4 cm seen along the anterior margin of left kidney with some
indentation of the renal parenchyma. Of note, a large subcapsular
hematoma with seen in this vicinity previously. This could reflect
some chronic hematoma or an underlying mass lesion, incompletely
characterized on this exam. No hydronephrosis or shadowing calculus.

Bladder:

Appears normal for degree of bladder distention.

Other:

None.
IMPRESSION: Large heterogeneous masslike focus abutting the left renal
parenchyma. This is in the vicinity of a large hematoma seen on
comparison imaging from 7079. Could reflect some recurrent or a
degree of chronic hemorrhage though an underlying mass lesion is not
fully excluded. Consider renal protocol CT or MR imaging for further
characterization.

Simple appearing cysts in the kidneys.

## 2022-03-01 IMAGING — DX DG ABD PORTABLE 1V
1 series · 2 of 2 positions shown · non-contrast
Comparison: 03/06/2021

CLINICAL DATA: Feeding tube placement

EXAM:
PORTABLE ABDOMEN - 1 VIEW

[Series 1: abdomen · 0.14mm/px · 2 of 2 slices shown]
[im 1/2]
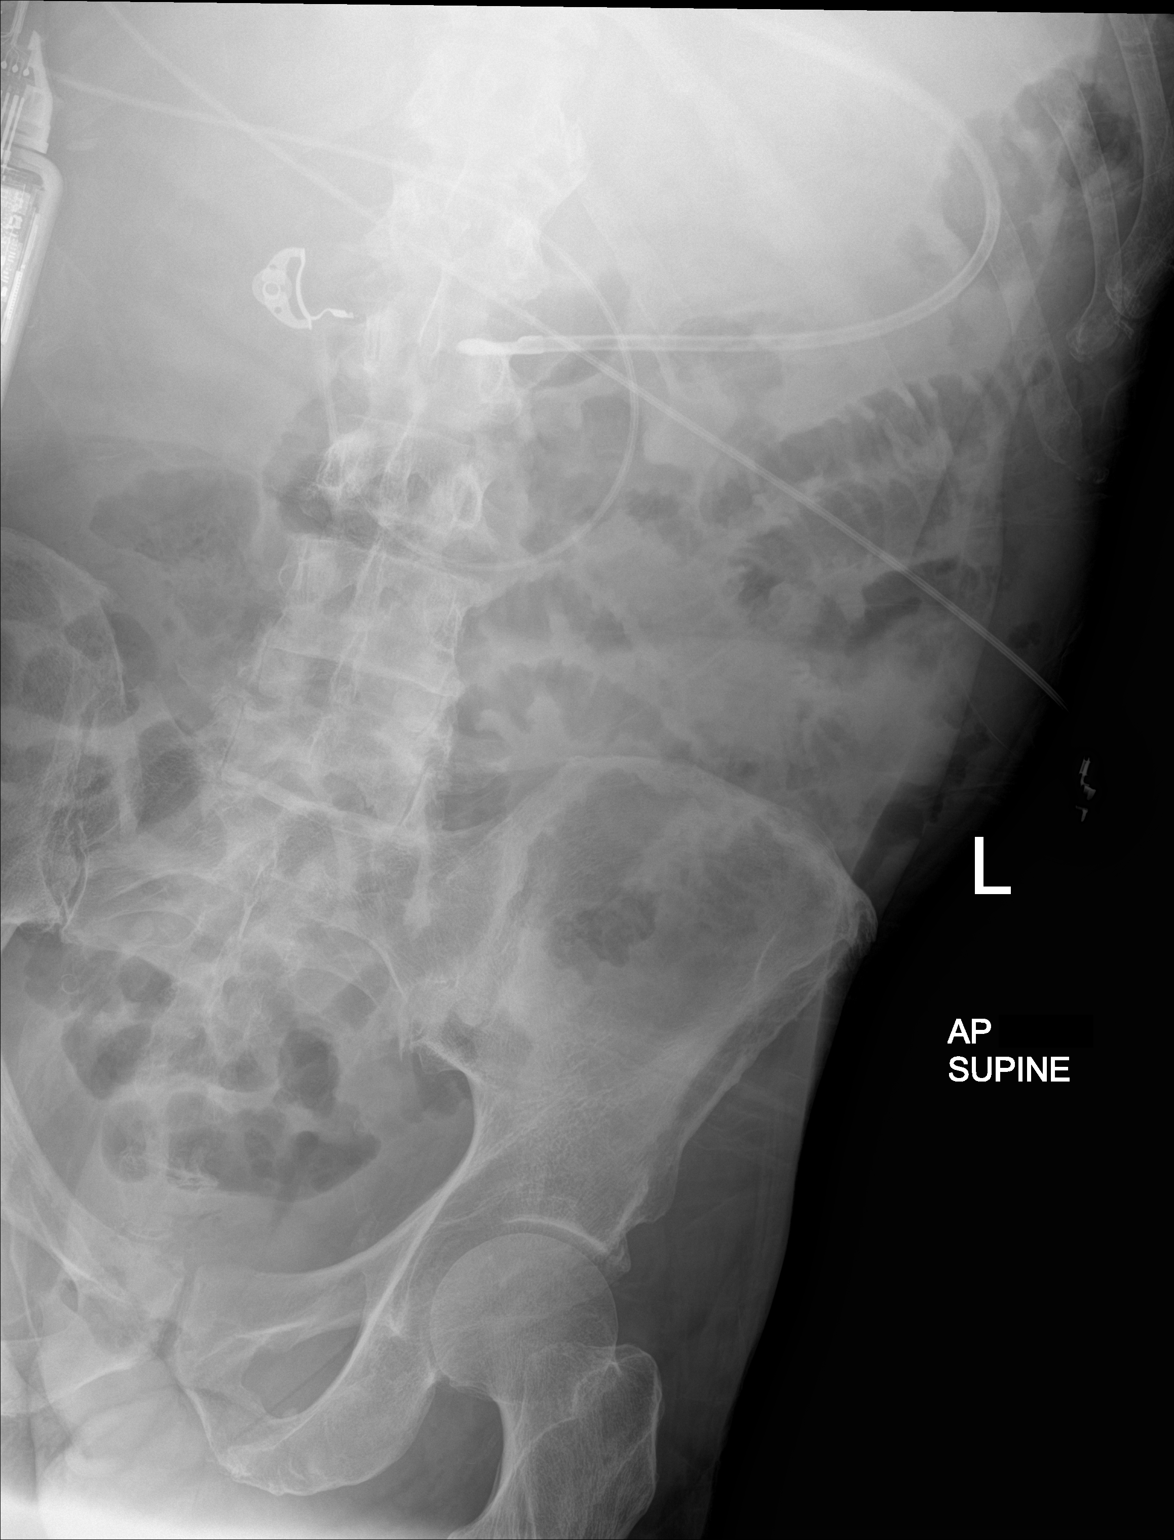
[im 2/2]
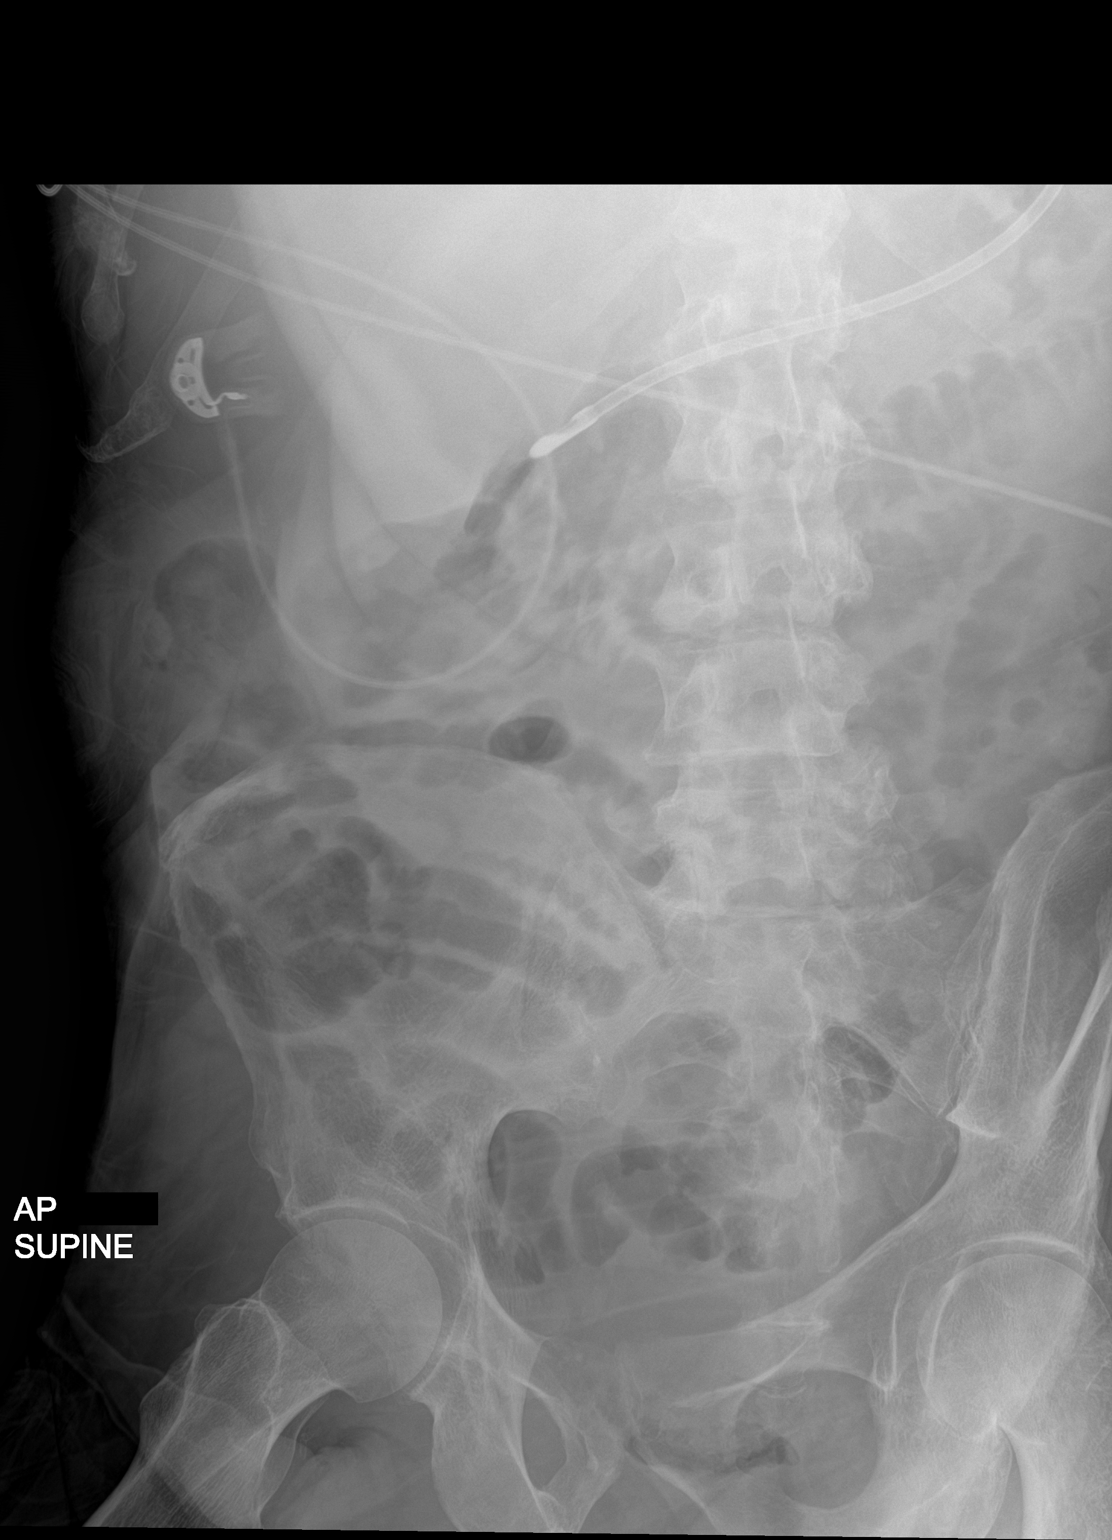

[2 of 2 positions shown; findings below may reference images not displayed]

FINDINGS: Feeding tube tip is in the distal stomach. Nonobstructive bowel gas
pattern.
IMPRESSION: Feeding tube tip in the distal stomach.
